# Patient Record
Sex: Male | Born: 1950 | Race: White | Hispanic: No | Marital: Married | Smoking: Former smoker
Health system: Southern US, Community
[De-identification: ages and names within clinical notes are randomized; demographics above are authoritative.]

## PROBLEM LIST (undated history)

## (undated) DIAGNOSIS — F329 Major depressive disorder, single episode, unspecified: Secondary | ICD-10-CM

## (undated) DIAGNOSIS — E0590 Thyrotoxicosis, unspecified without thyrotoxic crisis or storm: Secondary | ICD-10-CM

## (undated) DIAGNOSIS — I77810 Thoracic aortic ectasia: Secondary | ICD-10-CM

## (undated) DIAGNOSIS — I214 Non-ST elevation (NSTEMI) myocardial infarction: Secondary | ICD-10-CM

## (undated) DIAGNOSIS — Z973 Presence of spectacles and contact lenses: Secondary | ICD-10-CM

## (undated) DIAGNOSIS — K219 Gastro-esophageal reflux disease without esophagitis: Secondary | ICD-10-CM

## (undated) DIAGNOSIS — I739 Peripheral vascular disease, unspecified: Secondary | ICD-10-CM

## (undated) DIAGNOSIS — R0602 Shortness of breath: Secondary | ICD-10-CM

## (undated) DIAGNOSIS — E7800 Pure hypercholesterolemia, unspecified: Secondary | ICD-10-CM

## (undated) DIAGNOSIS — J9611 Chronic respiratory failure with hypoxia: Secondary | ICD-10-CM

## (undated) DIAGNOSIS — N179 Acute kidney failure, unspecified: Secondary | ICD-10-CM

## (undated) DIAGNOSIS — I4891 Unspecified atrial fibrillation: Secondary | ICD-10-CM

## (undated) DIAGNOSIS — E039 Hypothyroidism, unspecified: Secondary | ICD-10-CM

## (undated) DIAGNOSIS — F32A Depression, unspecified: Secondary | ICD-10-CM

## (undated) DIAGNOSIS — E079 Disorder of thyroid, unspecified: Secondary | ICD-10-CM

## (undated) DIAGNOSIS — I10 Essential (primary) hypertension: Secondary | ICD-10-CM

## (undated) DIAGNOSIS — J849 Interstitial pulmonary disease, unspecified: Secondary | ICD-10-CM

## (undated) DIAGNOSIS — M1990 Unspecified osteoarthritis, unspecified site: Secondary | ICD-10-CM

## (undated) DIAGNOSIS — I2510 Atherosclerotic heart disease of native coronary artery without angina pectoris: Secondary | ICD-10-CM

## (undated) DIAGNOSIS — E119 Type 2 diabetes mellitus without complications: Secondary | ICD-10-CM

## (undated) DIAGNOSIS — I5189 Other ill-defined heart diseases: Secondary | ICD-10-CM

## (undated) DIAGNOSIS — N183 Chronic kidney disease, stage 3 unspecified: Secondary | ICD-10-CM

## (undated) DIAGNOSIS — D126 Benign neoplasm of colon, unspecified: Secondary | ICD-10-CM

## (undated) DIAGNOSIS — K5790 Diverticulosis of intestine, part unspecified, without perforation or abscess without bleeding: Secondary | ICD-10-CM

## (undated) DIAGNOSIS — Z8709 Personal history of other diseases of the respiratory system: Secondary | ICD-10-CM

## (undated) DIAGNOSIS — F419 Anxiety disorder, unspecified: Secondary | ICD-10-CM

## (undated) DIAGNOSIS — I272 Pulmonary hypertension, unspecified: Secondary | ICD-10-CM

## (undated) HISTORY — DX: Shortness of breath: R06.02

## (undated) HISTORY — PX: HERNIA REPAIR: SHX51

## (undated) HISTORY — DX: Atherosclerotic heart disease of native coronary artery without angina pectoris: I25.10

## (undated) HISTORY — DX: Gastro-esophageal reflux disease without esophagitis: K21.9

## (undated) HISTORY — DX: Depression, unspecified: F32.A

## (undated) HISTORY — DX: Major depressive disorder, single episode, unspecified: F32.9

## (undated) HISTORY — DX: Essential (primary) hypertension: I10

## (undated) HISTORY — DX: Thoracic aortic ectasia: I77.810

## (undated) HISTORY — DX: Benign neoplasm of colon, unspecified: D12.6

## (undated) HISTORY — PX: FINGER GANGLION CYST EXCISION: SHX1636

## (undated) HISTORY — PX: COLONOSCOPY: SHX5424

## (undated) HISTORY — DX: Diverticulosis of intestine, part unspecified, without perforation or abscess without bleeding: K57.90

## (undated) HISTORY — DX: Chronic kidney disease, stage 3 unspecified: N18.30

## (undated) HISTORY — DX: Disorder of thyroid, unspecified: E07.9

## (undated) HISTORY — DX: Other ill-defined heart diseases: I51.89

## (undated) HISTORY — DX: Peripheral vascular disease, unspecified: I73.9

---

## 1980-03-23 HISTORY — PX: HEMORRHOID SURGERY: SHX153

## 1995-04-24 ENCOUNTER — Encounter: Payer: Self-pay | Admitting: Family Medicine

## 2004-02-01 ENCOUNTER — Ambulatory Visit: Payer: Self-pay | Admitting: Family Medicine

## 2004-03-23 ENCOUNTER — Encounter: Payer: Self-pay | Admitting: Family Medicine

## 2004-03-23 LAB — CONVERTED CEMR LAB: PSA: 0.3 ng/mL

## 2004-04-22 ENCOUNTER — Ambulatory Visit: Payer: Self-pay | Admitting: Family Medicine

## 2004-04-24 ENCOUNTER — Ambulatory Visit: Payer: Self-pay | Admitting: Family Medicine

## 2004-08-12 ENCOUNTER — Emergency Department (HOSPITAL_COMMUNITY): Admission: EM | Admit: 2004-08-12 | Discharge: 2004-08-12 | Payer: Self-pay | Admitting: Family Medicine

## 2004-10-22 ENCOUNTER — Ambulatory Visit: Payer: Self-pay | Admitting: Family Medicine

## 2004-12-25 ENCOUNTER — Ambulatory Visit: Payer: Self-pay | Admitting: Family Medicine

## 2005-02-11 ENCOUNTER — Ambulatory Visit: Payer: Self-pay | Admitting: Family Medicine

## 2005-02-16 ENCOUNTER — Ambulatory Visit: Payer: Self-pay | Admitting: Family Medicine

## 2005-04-23 ENCOUNTER — Encounter: Payer: Self-pay | Admitting: Family Medicine

## 2005-05-25 ENCOUNTER — Ambulatory Visit: Payer: Self-pay | Admitting: Family Medicine

## 2005-05-25 LAB — CONVERTED CEMR LAB: Microalbumin U total vol: 1.4 mg/L

## 2005-05-28 ENCOUNTER — Ambulatory Visit: Payer: Self-pay | Admitting: Family Medicine

## 2005-11-27 ENCOUNTER — Ambulatory Visit: Payer: Self-pay | Admitting: Family Medicine

## 2005-11-27 LAB — CONVERTED CEMR LAB: PSA: 0.6 ng/mL

## 2005-12-04 ENCOUNTER — Ambulatory Visit: Payer: Self-pay | Admitting: Family Medicine

## 2006-03-08 ENCOUNTER — Ambulatory Visit: Payer: Self-pay | Admitting: Family Medicine

## 2006-11-04 ENCOUNTER — Ambulatory Visit: Payer: Self-pay | Admitting: Gastroenterology

## 2006-11-18 ENCOUNTER — Ambulatory Visit: Payer: Self-pay | Admitting: Gastroenterology

## 2006-11-24 ENCOUNTER — Encounter: Payer: Self-pay | Admitting: Family Medicine

## 2007-01-13 ENCOUNTER — Telehealth: Payer: Self-pay | Admitting: Family Medicine

## 2007-03-02 ENCOUNTER — Encounter: Payer: Self-pay | Admitting: Family Medicine

## 2007-03-02 DIAGNOSIS — F3289 Other specified depressive episodes: Secondary | ICD-10-CM | POA: Insufficient documentation

## 2007-03-02 DIAGNOSIS — F17200 Nicotine dependence, unspecified, uncomplicated: Secondary | ICD-10-CM | POA: Insufficient documentation

## 2007-03-02 DIAGNOSIS — F329 Major depressive disorder, single episode, unspecified: Secondary | ICD-10-CM

## 2007-03-02 DIAGNOSIS — E1165 Type 2 diabetes mellitus with hyperglycemia: Secondary | ICD-10-CM

## 2007-03-02 DIAGNOSIS — E039 Hypothyroidism, unspecified: Secondary | ICD-10-CM

## 2007-03-02 DIAGNOSIS — E0590 Thyrotoxicosis, unspecified without thyrotoxic crisis or storm: Secondary | ICD-10-CM

## 2007-03-03 ENCOUNTER — Telehealth: Payer: Self-pay | Admitting: Family Medicine

## 2007-03-07 ENCOUNTER — Ambulatory Visit: Payer: Self-pay | Admitting: Family Medicine

## 2007-03-07 LAB — CONVERTED CEMR LAB
ALT: 19 units/L (ref 0–53)
Albumin: 4.1 g/dL (ref 3.5–5.2)
Alkaline Phosphatase: 63 units/L (ref 39–117)
BUN: 8 mg/dL (ref 6–23)
Calcium: 9.5 mg/dL (ref 8.4–10.5)
Chloride: 100 meq/L (ref 96–112)
GFR calc Af Amer: 90 mL/min
GFR calc non Af Amer: 74 mL/min
LDL Cholesterol: 99 mg/dL (ref 0–99)
PSA: 0.4 ng/mL (ref 0.10–4.00)
TSH: 1.49 microintl units/mL (ref 0.35–5.50)
Total CHOL/HDL Ratio: 4.6

## 2007-03-09 ENCOUNTER — Ambulatory Visit: Payer: Self-pay | Admitting: Family Medicine

## 2007-03-10 LAB — CONVERTED CEMR LAB
Creatinine,U: 42 mg/dL
Microalb, Ur: 0.2 mg/dL (ref 0.0–1.9)

## 2007-03-29 ENCOUNTER — Telehealth: Payer: Self-pay | Admitting: Family Medicine

## 2007-04-19 ENCOUNTER — Ambulatory Visit: Payer: Self-pay | Admitting: Family Medicine

## 2007-10-03 ENCOUNTER — Telehealth: Payer: Self-pay | Admitting: Family Medicine

## 2008-02-08 ENCOUNTER — Ambulatory Visit: Payer: Self-pay | Admitting: Family Medicine

## 2008-05-08 ENCOUNTER — Telehealth: Payer: Self-pay | Admitting: Family Medicine

## 2008-06-20 ENCOUNTER — Ambulatory Visit: Payer: Self-pay | Admitting: Family Medicine

## 2008-06-20 LAB — CONVERTED CEMR LAB
ALT: 33 units/L (ref 0–53)
AST: 33 units/L (ref 0–37)
Albumin: 3.8 g/dL (ref 3.5–5.2)
Basophils Absolute: 0 10*3/uL (ref 0.0–0.1)
Chloride: 108 meq/L (ref 96–112)
Eosinophils Relative: 2.5 % (ref 0.0–5.0)
GFR calc non Af Amer: 73.55 mL/min (ref >60)
Glucose, Bld: 138 mg/dL — ABNORMAL HIGH (ref 70–99)
HCT: 41.7 % (ref 39.0–52.0)
Hemoglobin: 14.9 g/dL (ref 13.0–17.0)
Lymphs Abs: 1.5 10*3/uL (ref 0.7–4.0)
Monocytes Relative: 6.5 % (ref 3.0–12.0)
Neutro Abs: 3.5 10*3/uL (ref 1.4–7.7)
Potassium: 4.5 meq/L (ref 3.5–5.1)
RDW: 11.6 % (ref 11.5–14.6)
Sodium: 142 meq/L (ref 135–145)
TSH: 1.22 microintl units/mL (ref 0.35–5.50)
VLDL: 29.6 mg/dL (ref 0.0–40.0)

## 2008-06-25 ENCOUNTER — Ambulatory Visit: Payer: Self-pay | Admitting: Family Medicine

## 2008-12-19 ENCOUNTER — Ambulatory Visit: Payer: Self-pay | Admitting: Family Medicine

## 2008-12-19 LAB — CONVERTED CEMR LAB
CO2: 30 meq/L (ref 19–32)
Chloride: 104 meq/L (ref 96–112)
Glucose, Bld: 121 mg/dL — ABNORMAL HIGH (ref 70–99)
Potassium: 4.7 meq/L (ref 3.5–5.1)
Sodium: 139 meq/L (ref 135–145)

## 2008-12-26 ENCOUNTER — Ambulatory Visit: Payer: Self-pay | Admitting: Family Medicine

## 2008-12-26 DIAGNOSIS — F411 Generalized anxiety disorder: Secondary | ICD-10-CM | POA: Insufficient documentation

## 2009-02-06 ENCOUNTER — Ambulatory Visit: Payer: Self-pay | Admitting: Family Medicine

## 2009-04-19 ENCOUNTER — Telehealth: Payer: Self-pay | Admitting: Family Medicine

## 2009-04-23 ENCOUNTER — Telehealth (INDEPENDENT_AMBULATORY_CARE_PROVIDER_SITE_OTHER): Payer: Self-pay

## 2009-04-24 ENCOUNTER — Ambulatory Visit: Payer: Self-pay | Admitting: Family Medicine

## 2009-06-04 ENCOUNTER — Ambulatory Visit: Payer: Self-pay | Admitting: Family Medicine

## 2009-06-04 DIAGNOSIS — J018 Other acute sinusitis: Secondary | ICD-10-CM | POA: Insufficient documentation

## 2009-08-06 ENCOUNTER — Telehealth: Payer: Self-pay | Admitting: Family Medicine

## 2009-10-29 ENCOUNTER — Encounter (INDEPENDENT_AMBULATORY_CARE_PROVIDER_SITE_OTHER): Payer: Self-pay | Admitting: *Deleted

## 2010-02-18 ENCOUNTER — Ambulatory Visit: Payer: Self-pay | Admitting: Family Medicine

## 2010-02-18 ENCOUNTER — Telehealth: Payer: Self-pay | Admitting: Family Medicine

## 2010-02-18 DIAGNOSIS — J029 Acute pharyngitis, unspecified: Secondary | ICD-10-CM

## 2010-02-18 DIAGNOSIS — L03019 Cellulitis of unspecified finger: Secondary | ICD-10-CM

## 2010-02-24 ENCOUNTER — Ambulatory Visit: Payer: Self-pay | Admitting: Family Medicine

## 2010-02-27 ENCOUNTER — Telehealth: Payer: Self-pay | Admitting: Family Medicine

## 2010-04-22 NOTE — Assessment & Plan Note (Signed)
Summary: THROAT INFECTION - FEELS LIKE ON FIRE / LFW   Vital Signs:  Patient profile:   60 year old male Height:      66.50 inches Weight:      179 pounds BMI:     28.56 Temp:     98.6 degrees F oral Pulse rate:   76 / minute Pulse rhythm:   regular BP sitting:   130 / 80  (left arm) Cuff size:   regular  Vitals Entered By: Sherrian Divers CMA Deborra Medina) (February 18, 2010 8:21 AM) CC: sore throat   History of Present Illness: 60 yo here for sore throat but has multiple issues he wants to discuss.  Just lost his job and wants to work everything in as quick as possible before he looses his insurance.  1.  URI symptoms- for past three weeks, URI symptoms are lingering.  Started with runny nose, body aches, subjective fever.  No cough is productive, has sinus pressure and ear pain. No wheezing or SOB.  2.  Has a cyst on his middle finger on left hand.  Has been there since he slammed a drill on his finger.  Sometimes he pokes it and it drains fluid but it fills back up.    3. Anixety- obviously more anxious than usual due to his recent job loss.  He is going to loose his home and everything he has worked so hard for.  Has panic attacks.  No SI or HI.  Alprazolam helps.  Current Medications (verified): 1)  Simvastatin 40 Mg  Tabs (Simvastatin) .... Take One By Mouth At Bedtime 2)  Levothroid 100 Mcg  Tabs (Levothyroxine Sodium) .... One Tabb By Mouth Once A Day. 3)  Prilosec Otc 20 Mg  Tbec (Omeprazole Magnesium) .Marland Kitchen.. 1 Daily 4)  Alprazolam 0.25 Mg Tabs (Alprazolam) .... One Tab By Mouth Two Times A Day As Needed For Anxiety 5)  Temovate 0.05 % Oint (Clobetasol Propionate) .... Apply To Area Two Times A Day As Needed 6)  Tussionex Pennkinetic Er 8-10 Mg/54m Lqcr (Chlorpheniramine-Hydrocodone) ..Marland Kitchen. 1 Tsp At Bedtime As Needed Cough. 7)  Chantix Continuing Month Pak 1 Mg Tabs (Varenicline Tartrate) .... As Dir 8)  Chantix Starting Month Pak 0.5 Mg X 11 & 1 Mg X 42 Tabs (Varenicline Tartrate)  .... As Dir 9)  Avelox 400 Mg  Tabs (Moxifloxacin Hcl) ..Marland Kitchen. 1 Tablet By Mouth Daily X 10 Days  Allergies: 1)  ! Penicillin V Potassium (Penicillin V Potassium) 2)  ! Budeprion Sr (Bupropion Hcl)  Past History:  Past Medical History: Last updated: 03/02/2007 Depression GERD Diabetes mellitus, type II Hyperthyroidism Hypothyroidism  Past Surgical History: Last updated: 03/09/2007 Hemmorhoidectomy 1982 I 131 Thyroid Trmt 1990s EGD Colonoscopy  [place] 1992 ETT, wnl, echo, mild MR 02/97 EGD/colonoscopy 1992        CT Head wnl 09/97 Colonoscopy ([facility] colitis 11/18/01 Colonoscopy polyps ([facility]     11/18/06    recheck 2013     Family History: Last updated: 06/25/2008 Father: A 83  hypothyroidism, reflux disease, elevated cholesterol, dx with prostate cancer treated with radiation, renal disease Mother: Died at age 963with colon cancer after a partial colectomy, ? HTN, DM Brother A 555 Sister A [id number] Laparotomy. 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 CV:  Denies chest pain or discomfort. Resp:  Complains of cough and sputum productive; denies shortness of breath and wheezing.  Physical Exam  General:  Well-developed,well-nourished,in no acute distress; alert,appropriate and cooperative throughout  examination, congested Ears:  TMs retracted bilaterally Nose:  External nasal examination shows no deformity or inflammation. Nasal mucosa are pink and moist without lesions, congested  Mouth:  pharyngeal erythema, no exudate.   Neck:  No deformities, masses, or tenderness noted. Lungs:  CTA bilaterally Heart:  Normal rate and regular rhythm. S1 and S2 normal without gallop, murmur, click, rub or other extra sounds. Extremities:  no edema in LE. 2 cm palpable paronychia on left 3rd digit. Cervical Nodes:  No lymphadenopathy noted Psych:  Cognition and judgment appear intact. Alert and cooperative with normal attention span and concentration. No apparent delusions, illusions, hallucinations   Impression & Recommendations:  Problem # 1:  OTHER ACUTE SINUSITIS (ICD-461.8) Assessment New Given duration and progression of symptoms, will treat for bacterial sinusitis. His updated medication list for this problem includes:    Tussionex Pennkinetic Er 8-10 Mg/48m Lqcr (Chlorpheniramine-hydrocodone) ..Marland Kitchen.. 1 tsp at bedtime as needed cough.    Avelox 400 Mg Tabs (Moxifloxacin hcl) ..Marland Kitchen.. 1 tablet by mouth daily x 10 days  Problem # 2:  ANXIETY (ICD-300.00) Assessment: Deteriorated Refilled his Alprazolom.  Not intereted in other medications or psychotherapy at this time. His updated medication list for this problem includes:    Alprazolam 0.25 Mg Tabs (Alprazolam) ..... One tab by mouth two times a day as needed for anxiety  Problem # 3:  PARONYCHIA, FINGER (ICD-681.02) Assessment: New Advised to schedule appt to drain next week. His updated medication list for this problem includes:    Avelox 400 Mg Tabs (Moxifloxacin hcl) ..Marland Kitchen.. 1 tablet by mouth daily x 10 days  Complete Medication List: 1)  Simvastatin 40 Mg Tabs (Simvastatin) .... Take one by mouth at bedtime 2)  Levothroid 100 Mcg Tabs (Levothyroxine sodium) .... One tabb by mouth once a day. 3)  Prilosec Otc 20 Mg Tbec (Omeprazole  magnesium) ..Marland Kitchen. 1 daily 4)  Alprazolam 0.25 Mg Tabs (Alprazolam) .... One tab by mouth two times a day as needed for anxiety 5)  Temovate 0.05 % Oint (Clobetasol propionate) .... Apply to area two times a day as needed 6)  Tussionex Pennkinetic Er 8-10 Mg/541mLqcr (Chlorpheniramine-hydrocodone) ...Marland Kitchen 1 tsp at bedtime as needed cough. 7)  Chantix Continuing Month Pak 1 Mg Tabs (Varenicline tartrate) .... As dir 8)  Chantix Starting Month Pak 0.5 Mg X 11 & 1 Mg X 42 Tabs (Varenicline tartrate) .... As dir 9)  Avelox 400 Mg Tabs (Moxifloxacin hcl) ...Marland Kitchen 1 tablet by mouth daily x 10 days  Other Orders: Rapid Strep ([phone] Patient Instructions: 1)  Please make a 30 minute appointment on your way out to drain your finger. Prescriptions: AVELOX 400 MG  TABS (MOXIFLOXACIN HCL) 1 tablet by mouth daily x 10 days  #10 x 0   Entered  and Authorized by:   Arnette Norris MD   Signed by:   Arnette Norris MD on 02/18/2010   Method used:   Electronically to        [id number] (retail)       [address], [place]  [address]       Ph: [phone] or [phone]       Fax: [phone]   RxID:   [id number] ALPRAZOLAM 0.25 MG TABS (ALPRAZOLAM) one tab by mouth two times a day as needed for anxiety  #90 x 0   Entered and Authorized by:   Arnette Norris MD   Signed by:   Arnette Norris MD on 02/18/2010   Method used:   Print then Give to Patient   RxID:   [id number] PRILOSEC OTC 20 MG  TBEC (OMEPRAZOLE MAGNESIUM) 1 DAILY  #90 x 3   Entered and Authorized by:   Arnette Norris MD   Signed by:   Arnette Norris MD on 02/18/2010   Method used:   Electronically to        [id number] (retail)       [address], [place]  [address]       Ph: [phone] or [phone]       Fax: [phone]   RxID:   [id number] LEVOTHROID 100 MCG  TABS (LEVOTHYROXINE SODIUM) one tabb by mouth once a day.  #90 x 3   Entered and Authorized by:    Arnette Norris MD   Signed by:   Arnette Norris MD on 02/18/2010   Method used:   Electronically to        [id number] (retail)       [address], [place]  [address]       Ph: [phone] or [phone]       Fax: [phone]   RxID:   [id number] SIMVASTATIN 40 MG  TABS (SIMVASTATIN) Take one by mouth at bedtime  #90 x 3   Entered and Authorized by:   Arnette Norris MD   Signed by:   Arnette Norris MD on 02/18/2010   Method used:   [facility]ronically to        [id number] ([facility]ail)       [address], [place]  [address]       Ph: [phone] or [phone]       Fax: [phone]   RxID:   [id number]    Orders Added: 1)  Rapid Strep [55974] 2)  Est. Patient Level IV [16384]    Current Allergies (reviewed today): ! PENICILLIN V POTASSIUM (PENICILLIN V POTASSIUM) ! BUDEPRION SR (BUPROPION HCL)  Laboratory Results    Other Tests  Rapid Strep: negative  Kit Test Internal QC: Positive   (Normal Range: Negative)

## 2010-04-22 NOTE — Progress Notes (Signed)
Summary: wants to try chantix  Phone Note Call from Patient Call back at Home Phone [phone]   Caller: Patient Call For: Raenette Rover MD Summary of Call: Pt states he was told to call when ready to quit smoking and he is ready.  [facility] [place]  States he has discussed this medicine with you before. Initial call taken by: Marty Heck CMA,  Aug 06, 2009 11:13 AM  Follow-up for Phone Call        Have pt pikup scripts and pack and get enrolled before starting med. Will help and help with cost. Follow-up by: Raenette Rover MD,  Aug 06, 2009 1:34 PM  Additional Follow-up for Phone Call Additional follow up Details #1::        [facility] for pt to call.             Marty Heck CMA  Aug 06, 2009 2:31 PM  Advised pt, he will come by to pick up packet. Additional Follow-up by: Marty Heck CMA,  Aug 06, 2009 3:46 PM    New/Updated Medications: CHANTIX CONTINUING MONTH PAK 1 MG TABS (VARENICLINE TARTRATE) as dir CHANTIX STARTING MONTH PAK 0.5 MG X 11 & 1 MG X 42 TABS (VARENICLINE TARTRATE) as dir Prescriptions: CHANTIX STARTING MONTH PAK 0.5 MG X 11 & 1 MG X 42 TABS (VARENICLINE TARTRATE) as dir  #1 pak x 0   Entered and Authorized by:   Raenette Rover MD   Signed by:   Raenette Rover MD on 08/06/2009   Method used:   Print then Give to Patient   RxID:   [id number] [place] PAK 1 MG TABS (VARENICLINE TARTRATE) as dir  #1 pak x 4   Entered and Authorized by:   Raenette Rover MD   Signed by:   Raenette Rover MD on 08/06/2009   Method used:   Print then Give to Patient   RxID:   [id number]

## 2010-04-22 NOTE — Assessment & Plan Note (Signed)
Summary: 30 MIN APPT TO DRAIN FINGER/[facility]   Vital Signs:  Patient profile:   60 year old male Height:      66.50 inches Weight:      177.75 pounds BMI:     28.36 Temp:     98.0 degrees F oral Pulse rate:   80 / minute Pulse rhythm:   regular BP sitting:   138 / 80  (left arm) Cuff size:   regular  Vitals Entered By: Sherrian Divers CMA Deborra Medina) (February 24, 2010 7:58 AM) CC: drain finger   History of Present Illness: 60 yo here to drain in paronychia on his finger.   Just lost his job and wants to work everything in as quick as possible before he looses his insurance.   Paronychia- has been on his 2nd digit of  left hand, comes and goes for a year.Marland Kitchen  Has been there since he slammed a drill on his finger.  Sometimes he pokes it and it drains fluid but it fills back up.  No fevers or chills.  Has been on Avelox for sinusitis, has 3 days left.      Current Medications (verified): 1)  Simvastatin 40 Mg  Tabs (Simvastatin) .... Take One By Mouth At Bedtime 2)  Levothroid 100 Mcg  Tabs (Levothyroxine Sodium) .... One Tabb By Mouth Once A Day. 3)  Prilosec Otc 20 Mg  Tbec (Omeprazole Magnesium) .Marland Kitchen.. 1 Daily 4)  Alprazolam 0.25 Mg Tabs (Alprazolam) .... One Tab By Mouth Two Times A Day As Needed For Anxiety 5)  Temovate 0.05 % Oint (Clobetasol Propionate) .... Apply To Area Two Times A Day As Needed 6)  Tussionex Pennkinetic Er 8-10 Mg/40m Lqcr (Chlorpheniramine-Hydrocodone) ..Marland Kitchen. 1 Tsp At Bedtime As Needed Cough. 7)  Chantix Continuing Month Pak 1 Mg Tabs (Varenicline Tartrate) .... As Dir 8)  Chantix Starting Month Pak 0.5 Mg X 11 & 1 Mg X 42 Tabs (Varenicline Tartrate) .... As Dir 9)  Avelox 400 Mg  Tabs (Moxifloxacin Hcl) ..Marland Kitchen. 1 Tablet By Mouth Daily X 10 Days 10)  Tessalon Perles 100 Mg  Caps (Benzonatate) ..Marland Kitchen. 1 Tab By Mouth Three Times A Day As Needed For Cough. 11)  Tussionex Pennkinetic Er 10-8 Mg/570mLqcr (Hydrocod Polst-Chlorphen Polst) .... 90m81my Mouth At Bedtime As Needed For  Cough  Allergies: 1)  ! Penicillin V Potassium (Penicillin V Potassium) 2)  ! Budeprion Sr (Bupropion Hcl)  Past History:  Past Medical History: Last updated: 03/02/2007 Depression GERD Diabetes mellitus, type II Hyperthyroidism Hypothyroidism  Past Surgical History: Last updated: 03/09/2007 Hemmorhoidectomy 1982 I 131 Thyroid Trmt 1990s EGD Colonoscopy  [place] 1992 ETT, wnl, echo, mild MR 02/97 EGD/colonoscopy 1992        CT Head wnl 09/97 Colonoscopy ([facility] 11/18/01 Colonoscopy polyps ([facility]   11/18/06    recheck 2013     Family History: Last updated: 06/25/2008 Father: A 83  hypothyroidism, reflux disease, elevated cholesterol, dx with prostate cancer treated with radiation, renal disease Mother: Died at age 32 32th colon cancer after a partial colectomy, ? HTN, DM Brother A 59 54ister A 58 60O Laparotomy. 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 GI:  Denies nausea and vomiting.  Physical Exam  General:  Well-developed,well-nourished,in no acute distress; alert,appropriate and cooperative throughout examination, less congested Extremities:  no edema in LE. 2 cm palpable paronychia on left end of 2nd digit, feels very firm, chronic nail changes. Psych:  Cognition and judgment appear intact. Alert and  cooperative with normal attention span and concentration. No apparent delusions, illusions, hallucinations   Impression & Recommendations:  Problem # 1:  PARONYCHIA, FINGER (ICD-681.02) Assessment Deteriorated Attempted to drain but there is chronic changes, thick, gel like fluid expressed. Finish avelox, call in a few days if he sees any signs of infection.  Refer to surgery. His updated medication list for this problem includes:    Avelox 400 Mg Tabs (Moxifloxacin hcl) .Marland Kitchen... 1 tablet by mouth daily x 10 days  Orders: Surgical Referral (Surgery) I&D Abscess, Simple / Single (10060)  Complete Medication List: 1)  Simvastatin 40 Mg Tabs (Simvastatin) .... Take one by mouth at bedtime 2)  Levothroid 100 Mcg Tabs (Levothyroxine sodium) .... One tabb by mouth once a day. 3)  Prilosec Otc 20 Mg Tbec (Omeprazole magnesium) .Marland Kitchen.. 1 daily 4)  Alprazolam 0.25 Mg Tabs (Alprazolam) .... One tab by mouth two times a day as needed for anxiety 5)  Temovate 0.05 % Oint (Clobetasol propionate) .... Apply to area two times a day as needed 6)  Tussionex Pennkinetic Er 8-10 Mg/7m Lqcr (Chlorpheniramine-hydrocodone) ..Marland Kitchen. 1 tsp at bedtime as needed cough. 7)  Chantix Continuing Month Pak 1 Mg Tabs (Varenicline tartrate) .... As dir 8)  Chantix Starting Month Pak 0.5 Mg X 11 & 1 Mg X 42 Tabs (Varenicline tartrate) .... As dir 9)  Avelox 400 Mg Tabs (Moxifloxacin hcl) ..Marland Kitchen. 1 tablet by mouth daily x 10 days 10)  Tessalon Perles 100 Mg Caps (Benzonatate) ..Marland Kitchen. 1 tab by mouth three times a day as needed for cough. 11)  Tussionex Pennkinetic Er 10-8 Mg/517mLqcr (Hydrocod polst-chlorphen polst) .... 32m54my mouth at bedtime as needed for cough  Patient Instructions: 1)  What you have is a chronic paronychia. 2)  Please soak your finger in warm water for 10 to 15 minutes at a time four times daily for at least 2 days.   3)  Take finish your antibiotic as directed. 4)  If symptoms do not improve by mid week, please  call me.  If symptoms worsen of you develop fevers or chills, please call us [place] soon as possible.   5)  Please stop by to see MarRosaria Ferries your way out.   Orders Added: 1)  Surgical Referral [Surgery] 2)  I&D Abscess, Simple / Single [10060]     Procedure Note Last Tetanus: Td (08/22/1998)  Incision & Drainage: The patient complains of pain, redness, and irritation. Date of onset: 02/27/2009 Indication: changing lesion  Procedure # 1: I & D    Size (in cm): 2.0    Location: 2nd digit.    Comment: several ccs of thick, clear fluid drained.    Instrument used: #15 blade    Anesthesia: 1% lidocaine w/o epinephrine  Cleaned and prepped with: betadine Additional Instructions: See pt instructions.    Current Allergies (reviewed today): !  PENICILLIN V POTASSIUM (PENICILLIN V POTASSIUM) ! BUDEPRION SR (BUPROPION HCL)

## 2010-04-22 NOTE — Progress Notes (Signed)
Summary: cough and congestion  Phone Note Call from Patient Call back at [phone]   Caller: Patient Call For: Raenette Rover MD Summary of Call: Pt request antibiotic still having congestion and cough. Cough med was called in by Dr Diona Browner on 04/19/09. Pt did not go to [facility]. Pt wants to see Dr. Council Mechanic on 04/24/09. Appt scheduled with Dr. Council Mechanic 04/24/09 at 2:45pm. Initial call taken by: Ozzie Hoyle LPN,  April 23, 9430 1:03 PM

## 2010-04-22 NOTE — Progress Notes (Signed)
Summary: requests more antibiotic  Phone Note Refill Request Call back at Home Phone [phone] Message from:  Patient  Refills Requested: Medication #1:  AVELOX 400 MG  TABS 1 tablet by mouth daily x 10 days Pt took his last avalox this morning and he is asking if he can have a refill.  He says his throat is better but still sore, finger is better but still sore. He didnt go to hand specialist because he would have had to pay out of pocket.  [facility] [place]  Initial call taken by: Marty Heck CMA, AAMA,  February 27, 2010 8:59 AM  Follow-up for Phone Call        He does not need a refill of avelox.  If his finger is sore and red, we need to try doxycyline. Will send in to his pharmacy. Arnette Norris MD  February 27, 2010 9:00 AM  Patient advised as instructed via telephone.  He says that his finger is not his concern but his throat is.  His throat is still feeling scratcy and raw.  Please advise.  Sherrian Divers CMA Deborra Medina)  February 27, 2010 10:34 AM    Additional Follow-up for Phone Call Additional follow up Details #1::        Will take time for URI to resolve completely.  Try losenges.  Doxy will work on URI bugs as well. Arnette Norris MD  February 27, 2010 10:48 AM  Called patient on home number and cell phone, got no answer or voicemail.  Will call back later.  Sherrian Divers CMA Deborra Medina)  February 27, 2010 11:14 AM      Additional Follow-up for Phone Call Additional follow up Details #2::    Advised pt.               Marty Heck CMA, AAMA  February 27, 2010 11:49 AM   New/Updated Medications: DOXYCYCLINE HYCLATE 100 MG CAPS (DOXYCYCLINE HYCLATE) Take 1 tab twice a day x 10 days Prescriptions: DOXYCYCLINE HYCLATE 100 MG CAPS (DOXYCYCLINE HYCLATE) Take 1 tab twice a day x 10 days  #20 x 0   Entered and Authorized by:   Arnette Norris MD   Signed by:   Arnette Norris MD on 02/27/2010   Method used:   [facility]        [id number]       [address]  [place], [place]  [address]       Ph: [phone] or [phone]       Fax: [phone]   RxID:   [id number]

## 2010-04-22 NOTE — Assessment & Plan Note (Signed)
Summary: ST,COUGH,CONGESTION/CLE   Vital Signs:  Patient profile:   60 year old male Height:      66.50 inches Weight:      178.38 pounds BMI:     28.46 Temp:     97.5 degrees F oral Pulse rate:   88 / minute Pulse rhythm:   regular BP sitting:   124 / 80  (left arm) Cuff size:   regular  Vitals Entered By: Christena Deem CMA Deborra Medina) (June 04, 2009 11:44 AM) CC: ST, cough, congestion   History of Present Illness: 60 yo here for facial pressure, congestion, and dry cough. Started over a month ago, given a Zpack.  Felt he got a little better but over last two weeks, symptoms are worsening again. Feels feverish at time with chills. No SOB, no CP, no n/v/d. Tussionex helps with cough.  Current Medications (verified): 1)  Simvastatin 40 Mg  Tabs (Simvastatin) .... Take One By Mouth At Bedtime 2)  Levothroid 100 Mcg  Tabs (Levothyroxine Sodium) .... One Tabb By Mouth Once A Day. 3)  Prilosec Otc 20 Mg  Tbec (Omeprazole Magnesium) .Marland Kitchen.. 1 Daily 4)  Alprazolam 0.25 Mg Tabs (Alprazolam) .... One Tab By Mouth Two Times A Day As Needed For Anxiety 5)  Temovate 0.05 % Oint (Clobetasol Propionate) .... Apply To Area Two Times A Day As Needed 6)  Tussionex Pennkinetic Er 8-10 Mg/28m Lqcr (Chlorpheniramine-Hydrocodone) ..Marland Kitchen. 1 Tsp At Bedtime As Needed Cough. 7)  Avelox 400 Mg  Tabs (Moxifloxacin Hcl) ..Marland Kitchen. 1 Tablet By Mouth Daily X 10 Days  Allergies: 1)  ! Penicillin V Potassium (Penicillin V Potassium) 2)  ! Budeprion Sr (Bupropion Hcl)  Review of Systems      See HPI General:  Complains of chills and fever. ENT:  Complains of nasal congestion, postnasal drainage, sinus pressure, and sore throat; denies ear discharge and earache. CV:  Denies chest pain or discomfort. Resp:  Complains of cough; denies shortness of breath, sputum productive, and wheezing.  Physical Exam  General:  Well-developed,well-nourished,in no acute distress; alert,appropriate and cooperative throughout  examination, congested Ears:  TMs retracted bilaterally Nose:  External nasal examination shows no deformity or inflammation. Nasal mucosa are pink and moist without lesions, congested w/ mucous L narrowed. Mouth:  Oral mucosa and oropharynx without lesions or exudates.  Teeth in good repair. Lungs:  CTA bilaterally Heart:  Normal rate and regular rhythm. S1 and S2 normal without gallop, murmur, click, rub or other extra sounds. Extremities:  no edema Cervical Nodes:  No lymphadenopathy noted Psych:  Cognition and judgment appear intact. Alert and cooperative with normal attention span and concentration. No apparent delusions, illusions, hallucinations   Impression & Recommendations:  Problem # 1:  OTHER ACUTE SINUSITIS (ICD-461.8) Assessment New Likely resistant. Will try Avelox 400 mg daily x 10 days. Tussionex as needed cough. The following medications were removed from the medication list:    Guaifenesin-codeine 100-10 Mg/518mSyrp (Guaifenesin-codeine) ...Marland Kitchen. 1-2 tsp by mouth at bedtime at bedtime    Zithromax Z-pak 250 Mg Tabs (Azithromycin) ...Marland Kitchen. As dir    Tessalon 200 Mg Caps (Benzonatate) ..... One tab by mouth three times a day    Tussionex Pennkinetic Er 8-10 Mg/67m57mqcr (Chlorpheniramine-hydrocodone) ..... One tsp by mouth at night for cough His updated medication list for this problem includes:    Tussionex Pennkinetic Er 8-10 Mg/67ml67mcr (Chlorpheniramine-hydrocodone) .....Marland Kitchen1 tsp at bedtime as needed cough.    Avelox 400 Mg Tabs (Moxifloxacin hcl) .....Marland Kitchen1 tablet  by mouth daily x 10 days  Complete Medication List: 1)  Simvastatin 40 Mg Tabs (Simvastatin) .... Take one by mouth at bedtime 2)  Levothroid 100 Mcg Tabs (Levothyroxine sodium) .... One tabb by mouth once a day. 3)  Prilosec Otc 20 Mg Tbec (Omeprazole magnesium) .Marland Kitchen.. 1 daily 4)  Alprazolam 0.25 Mg Tabs (Alprazolam) .... One tab by mouth two times a day as needed for anxiety 5)  Temovate 0.05 % Oint (Clobetasol  propionate) .... Apply to area two times a day as needed 6)  Tussionex Pennkinetic Er 8-10 Mg/15m Lqcr (Chlorpheniramine-hydrocodone) ..Marland Kitchen. 1 tsp at bedtime as needed cough. 7)  Avelox 400 Mg Tabs (Moxifloxacin hcl) ..Marland Kitchen. 1 tablet by mouth daily x 10 days Prescriptions: AVELOX 400 MG  TABS (MOXIFLOXACIN HCL) 1 tablet by mouth daily x 10 days  #10 x 0   Entered and Authorized by:   TArnette NorrisMD   Signed by:   TArnette NorrisMD on 06/04/2009   Method used:   Print then Give to Patient   RxID:   [id number] 8-10 MG/5ML LQCR (CHLORPHENIRAMINE-HYDROCODONE) 1 tsp at bedtime as needed cough.  #4 ounces x 0   Entered and Authorized by:   TArnette NorrisMD   Signed by:   TArnette NorrisMD on 06/04/2009   Method used:   Print then Give to Patient   RxID:   [id number]  Current Allergies (reviewed today): ! PENICILLIN V POTASSIUM (PENICILLIN V POTASSIUM) ! BUDEPRION SR (BUPROPION HCL)

## 2010-04-22 NOTE — Progress Notes (Signed)
Summary: requests cough medicine  Phone Note Call from Patient Call back at Home Phone [phone]   Caller: Patient Summary of Call: Pt was seen this morning and thought that a cough medicine was going to be prescribed.  He is asking that something with codeine be sent to [facility] Initial call taken by: Marty Heck CMA, AAMA,  February 18, 2010 11:59 AM  Follow-up for Phone Call        Patient is requesting Tussinex which he has had in the past.  Dr. Deborra Medina is out of the office please advise.  Sherrian Divers CMA Deborra Medina)  February 18, 2010 1:21 PM   Additional Follow-up for Phone Call Additional follow up Details #1::        Please call in tussionex 107m by mouth at bedtime as needed for cough.  #4oz, no rf.  He had been on this before.  sedation caution.   thanks.  Additional Follow-up by: GElsie StainMD,  February 18, 2010 1:31 PM    Additional Follow-up for Phone Call Additional follow up Details #2::    Rx called to pharmacy and spouse notified via telephone. Follow-up by: NSherrian DiversCMA (Deborra Medina,  February 18, 2010 1:45 PM  New/Updated Medications: TESSALON PERLES 100 MG  CAPS (BENZONATATE) 1 tab by mouth three times a day as needed for cough. TUSSIONEX PENNKINETIC ER 10-8 MG/5ML LQCR (HYDROCOD POLST-CHLORPHEN POLST) 523mby mouth at bedtime as needed for cough Prescriptions: TUSSIONEX PENNKINETIC ER 10-8 MG/5ML LQCR (HYDROCOD POLST-CHLORPHEN POLST) 34m54my mouth at bedtime as needed for cough  #4 ounces x 0   Entered by:   NikSherrian DiversA (AAMJerseyt[place] Authorized by:   GraElsie Stain   Signed by:   NikSherrian DiversA (AAMClear Laken 02/18/2010   Method used:   [facility] ...       [id number]       [address] [place]  [id number]    Ph: [phone]    Fax: [phone]:   [id number] PERLES 100 MG  CAPS (BENZONATATE) 1 tab by mouth three times a day as needed for cough.  #30 x  0   Entered and Authorized by:   TalArnette Norris   Signed by:   TalArnette Norris on 02/18/2010   Method used:   [facility]        [id number]       [address]  [id number]    Ph: [phone]    Fax: [phone]:  :   [id number] Allergies (reviewed today): ! PENICILLIN V POTASSIUM (PENICILLIN V POTASSIUM) ! BUDEPRION SR (BUPROPION HCL)

## 2010-04-22 NOTE — Assessment & Plan Note (Signed)
Summary: Brian Casey CONGESTED,ST/RI   Vital Signs:  Patient profile:   60 year old male Weight:      183 pounds BMI:     29.20 Temp:     98.2 degrees F oral Pulse rate:   60 / minute Pulse rhythm:   regular BP sitting:   120 / 64  (left arm) Cuff size:   regular  Vitals Entered By: Emelia Salisbury LPN (April 24, 2561 2:33 PM) CC: Productive cough/green, runny nose and head congestion   History of Present Illness: Pt here for congestion. he has chills occas but no apparent fever. He has been using "home remedies" like using Vicks on the chest. He denies headache, he is congested in the head, ant cerv chain nodes swollen yest. He had old Zpack that he thought would help but it "didn't cure me." No ear pain, rhinitis that is clear to yellow, some ST now, worse when it started, "things feel closed...going on 11/2 weeks, has cough productive occas green sometimes clear. Coughs significantly at night. He denies SOB, no N/V.  He has taken Tyl  and Zpak.    Allergies: 1)  ! Penicillin V Potassium (Penicillin V Potassium) 2)  ! Budeprion Sr (Bupropion Hcl)  Physical Exam  General:  Well-developed,well-nourished,in no acute distress; alert,appropriate and cooperative throughout examination, congested Head:  Normocephalic and atraumatic without obvious abnormalities. No apparent alopecia or balding. Sinuses NT Eyes:  Conjunctiva clear bilaterally Ears:  External ear exam shows white dry skin lesion on R pinna.  Otoscopic examination reveals mild inflammation in R ear canal, tympanic membranes are intact bilaterally without bulging, retraction, inflammation or discharge. Hearing is grossly normal  TMs dull to LR.bilaterally. Nose:  External nasal examination shows no deformity or inflammation. Nasal mucosa are pink and moist without lesions, congested w/ mucous L narrowed. Mouth:  Oral mucosa and oropharynx without lesions or exudates.  Teeth in good repair. Neck:  No deformities,  masses, or tenderness noted. Lungs:  Ronchi throughout with mild fine wheezing.   Impression & Recommendations:  Problem # 1:  BRONCHITIS- ACUTE (ICD-466.0) Assessment New  See instructions. His updated medication list for this problem includes:    Guaifenesin-codeine 100-10 Mg/535m Syrp (Guaifenesin-codeine) ..Marland Kitchen.. 1-2 tsp by mouth at bedtime at bedtime    Zithromax Z-pak 250 Mg Tabs (Azithromycin) ..Marland Kitchen.. As dir    Tessalon 200 Mg Caps (Benzonatate) ..... One tab by mouth three times a day    Tussionex Pennkinetic Er 8-10 Mg/550mLqcr (Chlorpheniramine-hydrocodone) ..... One tsp by mouth at night for cough  Take antibiotics and other medications as directed. Encouraged to push clear liquids, get enough rest, and take acetaminophen as needed. To be seen in 5-7 days if no improvement, sooner if worse.  Complete Medication List: 1)  Simvastatin 40 Mg Tabs (Simvastatin) .... Take one by mouth at bedtime 2)  Levothroid 100 Mcg Tabs (Levothyroxine sodium) .... One tabb by mouth once a day. 3)  Prilosec Otc 20 Mg Tbec (Omeprazole magnesium) ...Marland Kitchen 1 daily 4)  Alprazolam 0.25 Mg Tabs (Alprazolam) .... One tab by mouth two times a day as needed for anxiety 5)  Temovate 0.05 % Oint (Clobetasol propionate) .... Apply to area two times a day as needed 6)  Guaifenesin-codeine 100-10 Mg/35m69myrp (Guaifenesin-codeine) ....Marland Kitchen1-2 tsp by mouth at bedtime at bedtime 7)  Zithromax Z-pak 250 Mg Tabs (Azithromycin) .... As dir 8)  Tessalon 200 Mg Caps (Benzonatate) .... One tab by mouth three times a day 9)  Tussionex Pennkinetic Er 8-10 Mg/86m Lqcr (Chlorpheniramine-hydrocodone) .... One tsp by mouth at night for cough  Patient Instructions: 1)  Start Z pak again next Wed. 2)  Take Guaifenesin by [facility] and getting MUCOUS RELIEF EXPECTORANT (4046m, take 11/2 tabs by mouth AM and NOON. 3)  Drink lots of fluids anytime taking Guaifenesin.  4)  Tessalon three times a day    5)  Tussionex at night.  6)  Keep lozenge in mouth. Prescriptions: TUCathie HoopsR 8-10 MG/5ML LQCR (CHLORPHENIRAMINE-HYDROCODONE) one tsp by mouth at night for cough  #8 oz x 0   Entered and Authorized by:   RoRaenette RoverD   Signed by:   RoRaenette RoverD on 04/24/2009   Method used:   Print then Give to Patient   RxID:   [id number] 200 MG CAPS (BENZONATATE) one tab by mouth three times a day  #40 x 0   Entered and Authorized by:   RoRaenette RoverD   Signed by:   RoRaenette RoverD on 04/24/2009   Method used:   [facility]        [id number]       [address]  [id number]     Ph: [phone]     Fax: [phone] RxID:   [id number] Z-PAK 250 MG TABS (AZITHROMYCIN) as dir  #1 pak x 0   Entered and Authorized by:   RoRaenette RoverD   Signed by:   RoRaenette RoverD on 04/24/2009   Method used:   [facility]        [id number]       [address]  [id number]     Ph: [phone]     Fax: [phone] RxID:   [id number] Current Allergies (reviewed today): ! PENICILLIN V POTASSIUM (PENICILLIN V POTASSIUM) ! BUDEPRION SR (BUPROPION HCL)

## 2010-04-22 NOTE — Letter (Signed)
Summary: Anabel Halon letter  [place] at [facility]  [address] [place], [place] [address]   Phone: [phone]  Fax: [phone]       10/29/2009 MRN: [id number]  [facility] [address] [place], [place]  [address]  Dear Mr. Meriam Sprague [place], and [place] announce the retirement of Modesto Charon, M.D., from full-time practice at the [facility] office effective September 19, 2009 and his plans of returning part-time.  It is important to Dr. Council Mechanic and to our practice that you understand that [facility] has seven physicians in our office for your health care needs.  We will continue to offer the same exceptional care that you have today.    Dr. Council Mechanic has spoken to many of you about his plans for retirement and returning part-time in the fall.   We will continue to work with you through the transition to schedule appointments for you in the office and meet the high standards that [facility] is committed to.   Again, it is with great pleasure that we share the news that Dr. Council Mechanic will return to [facility] at [facility] in October of 2011 with a reduced schedule.    If you have any questions, or would like to request an appointment with one of our physicians, please call us at [phone] and press the option for Scheduling an appointment.  We take pleasure in providing you with excellent patient care and look forward to seeing you at your next office visit.  [facility] are:  Viviana Simpler, M.D. Teresa Pelton, M.D. Loura Pardon, M.D. Eliezer Lofts, M.D. Owens Loffler, M.D. Arnette Norris, M.D. We proudly welcomed Renford Dills, M.D. and Ria Bush, M.D. to the practice in July/August 2011.  Sincerely,  [facility]

## 2010-04-22 NOTE — Progress Notes (Signed)
Summary: bronchial infection  Phone Note Call from Patient Call back at [phone] cell   Caller: Patient Call For: Dr Diona Browner Summary of Call: Pt left v/m having bronchial infection and wanted call back. When I called back I got pt's v/m and pt to call me back. Initial call taken by: Ozzie Hoyle LPN,  April 20, 3435 2:59 PM  Follow-up for Phone Call        I called pt back and pt said he did not get my v/m. Pt said Dr. Council Mechanic used to call in zpack and cough med with codeine. I offered pt [facility] and he said would continue home remedy  until Mon.  If he was not better he would call b ack and speak with Dr. Council Mechanic. Pt has been sick 4 days withbronchial problem. I explained to pt if needed he could call office # and get setup with [facility]. Pt said OK he would see if he needed that.Ozzie Hoyle LPN  April 19, 3576 5:27 PM   Additional Follow-up for Phone Call Additional follow up Details #1::        Agreed..we don't call in antibitoics with exam as policy. Will call in cough suppressant. [facility] if not improving.  Additional Follow-up by: Eliezer Lofts MD,  April 19, 2009 5:32 PM    Additional Follow-up for Phone Call Additional follow up Details #2::    [facility] as [facility]. Patient notified as instructed by telephone. Will send to Dr. Council Mechanic per Dr. Rometta Emery Arn Medal LPN  April 19, 9782 5:37 PM  Agree with above. Will not call in Abs w/o being seen either. Follow-up by: Raenette Rover MD,  April 19, 2009 5:42 PM  New/Updated Medications: GUAIFENESIN-CODEINE 100-10 MG/5ML SYRP (GUAIFENESIN-CODEINE) 1-2 tsp by mouth at bedtime at bedtime Prescriptions: GUAIFENESIN-CODEINE 100-10 MG/5ML SYRP (GUAIFENESIN-CODEINE) 1-2 tsp by mouth at bedtime at bedtime  #8 oz x 0   Entered and Authorized by:   Eliezer Lofts MD   Signed by:   Eliezer Lofts MD on 04/19/2009   Method used:   [facility]phoned to ...       [id number]       [address] [place], [place]  [address]       Ph: [phone] or [phone]       Fax: [phone]   RxID:   [id number]

## 2010-07-28 ENCOUNTER — Ambulatory Visit (INDEPENDENT_AMBULATORY_CARE_PROVIDER_SITE_OTHER): Payer: BC Managed Care – PPO | Admitting: Family Medicine

## 2010-07-28 ENCOUNTER — Encounter: Payer: Self-pay | Admitting: Family Medicine

## 2010-07-28 VITALS — BP 136/86 | HR 62 | Temp 98.9°F | Ht 67.0 in | Wt 185.8 lb

## 2010-07-28 DIAGNOSIS — J018 Other acute sinusitis: Secondary | ICD-10-CM

## 2010-07-28 LAB — HM COLONOSCOPY

## 2010-07-28 LAB — HM DIABETES FOOT EXAM

## 2010-07-28 MED ORDER — CHLORPHENIRAMINE-HYDROCODONE 8-10 MG/5ML PO LQCR: 5.0000 mL | Freq: Two times a day (BID) | ORAL | Status: DC | PRN

## 2010-07-28 MED ORDER — MOXIFLOXACIN HCL 400 MG PO TABS: 400.0000 mg | ORAL_TABLET | Freq: Every day | ORAL | Status: AC

## 2010-07-28 NOTE — Progress Notes (Signed)
URI symptoms- for past three weeks, URI symptoms are lingering.  Started with runny nose, body aches, subjective fever.  Now cough is productive, has sinus pressure and ear pain. No wheezing or SOB.  The PMH, PSH, Social History, Family History, Medications, and allergies have been reviewed in [facility], and have been updated if relevant.  Review of Systems       See HPI General:  Complains of loss of appetite and malaise. CV:  Denies chest pain or discomfort. Resp:  Complains of cough and sputum productive; denies shortness of breath and wheezing.  Physical Exam BP 136/86  Pulse 62  Temp(Src) 98.9 F (37.2 C) (Oral)  Ht _0  (1.702 m)  Wt 185 lb 12.8 oz (84.278 kg)  BMI 29.10 kg/m2  General:  Well-developed,well-nourished,in no acute distress; alert,appropriate and cooperative throughout examination, congested Ears:  TMs retracted bilaterally Nose:  External nasal examination shows no deformity or inflammation. Nasal mucosa are pink and moist without lesions, congested  Mouth:  pharyngeal erythema, no exudate.   Neck:  No deformities, masses, or tenderness noted. Lungs:  CTA bilaterally Heart:  Normal rate and regular rhythm. S1 and S2 normal without gallop, murmur, click, rub or other extra sounds. Cervical Nodes:  No lymphadenopathy noted Psych:  Cognition and judgment appear intact. Alert and cooperative with normal attention span and concentration. No apparent delusions, illusions, hallucinations

## 2010-07-28 NOTE — Assessment & Plan Note (Signed)
New. PCN allergic and reports intolerance to Zpack. Given duration and progression of symptoms, will treat for bacterial sinusitis with Avelox. Supportive care as per pt instructions.

## 2010-07-28 NOTE — Patient Instructions (Signed)
Take antibiotic as directed.  Drink lots of fluids.  Treat sympotmatically with Mucinex, nasal saline irrigation, and Tylenol/Ibuprofen. Also try claritin D or zyrtec D over the counter- two times a day as needed ( have to sign for them at pharmacy). You can use warm compresses.  Cough suppressant at night. Call if not improving as expected in 5-7 days.

## 2011-01-16 ENCOUNTER — Other Ambulatory Visit: Payer: Self-pay | Admitting: Family Medicine

## 2011-01-16 DIAGNOSIS — E119 Type 2 diabetes mellitus without complications: Secondary | ICD-10-CM

## 2011-01-16 DIAGNOSIS — E039 Hypothyroidism, unspecified: Secondary | ICD-10-CM

## 2011-01-16 DIAGNOSIS — Z136 Encounter for screening for cardiovascular disorders: Secondary | ICD-10-CM

## 2011-01-21 ENCOUNTER — Other Ambulatory Visit (INDEPENDENT_AMBULATORY_CARE_PROVIDER_SITE_OTHER): Payer: BC Managed Care – PPO

## 2011-01-21 DIAGNOSIS — E119 Type 2 diabetes mellitus without complications: Secondary | ICD-10-CM

## 2011-01-21 DIAGNOSIS — Z136 Encounter for screening for cardiovascular disorders: Secondary | ICD-10-CM

## 2011-01-21 DIAGNOSIS — E039 Hypothyroidism, unspecified: Secondary | ICD-10-CM

## 2011-01-21 LAB — BASIC METABOLIC PANEL
Calcium: 8.9 mg/dL (ref 8.4–10.5)
Chloride: 105 mEq/L (ref 96–112)
Creatinine, Ser: 1 mg/dL (ref 0.4–1.5)

## 2011-01-21 LAB — LIPID PANEL
Cholesterol: 159 mg/dL (ref 0–200)
Triglycerides: 105 mg/dL (ref 0.0–149.0)

## 2011-01-26 ENCOUNTER — Ambulatory Visit (INDEPENDENT_AMBULATORY_CARE_PROVIDER_SITE_OTHER): Payer: BC Managed Care – PPO | Admitting: Family Medicine

## 2011-01-26 ENCOUNTER — Encounter: Payer: Self-pay | Admitting: Family Medicine

## 2011-01-26 VITALS — BP 140/80 | HR 71 | Temp 98.6°F | Ht 67.0 in | Wt 184.8 lb

## 2011-01-26 DIAGNOSIS — E039 Hypothyroidism, unspecified: Secondary | ICD-10-CM

## 2011-01-26 DIAGNOSIS — E119 Type 2 diabetes mellitus without complications: Secondary | ICD-10-CM

## 2011-01-26 DIAGNOSIS — E785 Hyperlipidemia, unspecified: Secondary | ICD-10-CM | POA: Insufficient documentation

## 2011-01-26 DIAGNOSIS — Z0000 Encounter for general adult medical examination without abnormal findings: Secondary | ICD-10-CM

## 2011-01-26 DIAGNOSIS — Z23 Encounter for immunization: Secondary | ICD-10-CM

## 2011-01-26 MED ORDER — SIMVASTATIN 40 MG PO TABS: 40.0000 mg | ORAL_TABLET | Freq: Every day | ORAL | Status: DC

## 2011-01-26 MED ORDER — CLOBETASOL PROPIONATE 0.05 % EX OINT: 1.0000 "application " | TOPICAL_OINTMENT | Freq: Two times a day (BID) | CUTANEOUS | Status: DC

## 2011-01-26 MED ORDER — LEVOTHYROXINE SODIUM 100 MCG PO TABS: 100.0000 ug | ORAL_TABLET | Freq: Every day | ORAL | Status: DC

## 2011-01-26 NOTE — Progress Notes (Signed)
  Subjective:    Patient ID: Brian Casey, male    DOB: 06/04/1950, 60 y.o.   MRN: [id number]  HPI  60 yo here for CPX.  Hypothyroidism- On synthroid 100 mcg. Denies any symptoms of hypo or hyperthyroidism. Lab Results  Component Value Date   TSH 0.44 01/21/2011    Lipid panel- On Zocor 40 mg daily.  HDL MUCH improved! Denies any myalgias. Lab Results  Component Value Date   CHOL 159 01/21/2011   HDL 51.90 01/21/2011   LDLCALC 86 01/21/2011   TRIG 105.0 01/21/2011   CHOLHDL 3 01/21/2011     Review of Systems  See HPI Patient reports no  vision/ hearing changes,anorexia, weight change, fever ,adenopathy, persistant / recurrent hoarseness, swallowing issues, chest pain, edema,persistant / recurrent cough, hemoptysis, dyspnea(rest, exertional, paroxysmal nocturnal), gastrointestinal  bleeding (melena, rectal bleeding), abdominal pain, excessive heart burn, GU symptoms(dysuria, hematuria, pyuria, voiding/incontinence  Issues) syncope, focal weakness, severe memory loss, concerning skin lesions, depression, anxiety, abnormal bruising/bleeding, major joint swelling.       Objective:   Physical Exam BP 140/80  Pulse 71  Temp(Src) 98.6 F (37 C) (Oral)  Ht _0  (1.702 m)  Wt 184 lb 12 oz (83.802 kg)  BMI 28.94 kg/m2 General:  overweght male in NAD Eyes:  PERRL Ears:  External ear exam shows no significant lesions or deformities.  Otoscopic examination reveals clear canals, tympanic membranes are intact bilaterally without bulging, retraction, inflammation or discharge. Hearing is grossly normal bilaterally. Nose:  External nasal examination shows no deformity or inflammation. Nasal mucosa are pink and moist without lesions or exudates. Mouth:  Oral mucosa and oropharynx without lesions or exudates.  Teeth in good repair. Neck:  no carotid bruit or thyromegaly no cervical or supraclavicular lymphadenopathy  Lungs:  Normal respiratory effort, chest expands symmetrically.  Lungs are clear to auscultation, no crackles or wheezes. Heart:  Normal rate and regular rhythm. S1 and S2 normal without gallop, murmur, click, rub or other extra sounds. Abdomen:  Bowel sounds positive,abdomen soft and non-tender without masses, organomegaly or hernias noted. Genitalia:  Testes bilaterally descended without nodularity, tenderness or masses. No scrotal masses or lesions. No penis lesions or urethral discharge. Pulses:  R and L posterior tibial pulses are full and equal bilaterally  Extremities:  no edema    Assessment & Plan:   1. Need for prophylactic vaccination and inoculation against influenza    2. Routine general medical examination at a health care facility  Reviewed preventive care protocols, scheduled due services, and updated immunizations Discussed nutrition, exercise, diet, and healthy lifestyle.  IFOB Flu shot  3. DIABETES MELLITUS, TYPE II  Diet controlled.  A1c excellent.   4. HYPOTHYROIDISM  Stable.  Continue current dose of Synthroid. levothyroxine (SYNTHROID, LEVOTHROID) 100 MCG tablet  5. HLD (hyperlipidemia)  Improved and at goal.  Continue current dose of Zocor. simvastatin (ZOCOR) 40 MG tablet

## 2011-01-26 NOTE — Patient Instructions (Signed)
Health Maintenance, Males A healthy lifestyle and preventative care can promote health and wellness.  Maintain regular health, dental, and eye exams.   Eat a healthy diet. Foods like vegetables, fruits, whole grains, low-fat dairy products, and lean protein foods contain the nutrients you need without too many calories. Decrease your intake of foods high in solid fats, added sugars, and salt. Get information about a proper diet from your caregiver, if necessary.   Regular physical exercise is one of the most important things you can do for your health. Most adults should get at least 150 minutes of moderate-intensity exercise (any activity that increases your heart rate and causes you to sweat) each week. In addition, most adults need muscle-strengthening exercises on 2 or more days a week.    Maintain a healthy weight. The body mass index (BMI) is a screening tool to identify possible weight problems. It provides an estimate of body fat based on height and weight. Your caregiver can help determine your BMI, and can help you achieve or maintain a healthy weight. For adults 20 years and older:   A BMI below 18.5 is considered underweight.   A BMI of 18.5 to 24.9 is normal.   A BMI of 25 to 29.9 is considered overweight.   A BMI of 30 and above is considered obese.   Maintain normal blood lipids and cholesterol by exercising and minimizing your intake of saturated fat. Eat a balanced diet with plenty of fruits and vegetables. Blood tests for lipids and cholesterol should begin at age 82 and be repeated every 5 years. If your lipid or cholesterol levels are high, you are over 50, or you are a high risk for heart disease, you may need your cholesterol levels checked more frequently.Ongoing high lipid and cholesterol levels should be treated with medicines, if diet and exercise are not effective.   If you smoke, find out from your caregiver how to quit. If you do not use tobacco, do not start.    If you choose to drink alcohol, do not exceed 2 drinks per day. One drink is considered to be 12 ounces (355 mL) of beer, 5 ounces (148 mL) of wine, or 1.5 ounces (44 mL) of liquor.   Avoid use of street drugs. Do not share needles with anyone. Ask for help if you need support or instructions about stopping the use of drugs.   High blood pressure causes heart disease and increases the risk of stroke. Blood pressure should be checked at least every 1 to 2 years. Ongoing high blood pressure should be treated with medicines if weight loss and exercise are not effective.   If you are 58 to 58 years old, ask your caregiver if you should take aspirin to prevent heart disease.   Diabetes screening involves taking a blood sample to check your fasting blood sugar level. This should be done once every 3 years, after age 42, if you are within normal weight and without risk factors for diabetes. Testing should be considered at a younger age or be carried out more frequently if you are overweight and have at least 1 risk factor for diabetes.   Colorectal cancer can be detected and often prevented. Most routine colorectal cancer screening begins at the age of 40 and continues through age 23. However, your caregiver may recommend screening at an earlier age if you have risk factors for colon cancer. On a yearly basis, your caregiver may provide home test kits to check for hidden  blood in the stool. Use of a small camera at the end of a tube, to directly examine the colon (sigmoidoscopy or colonoscopy), can detect the earliest forms of colorectal cancer. Talk to your caregiver about this at age 25, when routine screening begins. Direct examination of the colon should be repeated every 5 to 10 years through age 66, unless early forms of pre-cancerous polyps or small growths are found.   Healthy men should no longer receive prostate-specific antigen (PSA) blood tests as part of routine cancer screening. Consult with  your caregiver about prostate cancer screening.   Practice safe sex. Use condoms and avoid high-risk sexual practices to reduce the spread of sexually transmitted infections (STIs).   Use sunscreen with a sun protection factor (SPF) of 30 or greater. Apply sunscreen liberally and repeatedly throughout the day. You should seek shade when your shadow is shorter than you. Protect yourself by wearing long sleeves, pants, a wide-brimmed hat, and sunglasses year round, whenever you are outdoors.   Notify your caregiver of new moles or changes in moles, especially if there is a change in shape or color. Also notify your caregiver if a mole is larger than the size of a pencil eraser.   A one-time screening for abdominal aortic aneurysm (AAA) and surgical repair of large AAAs by sound wave imaging (ultrasonography) is recommended for ages 24 to 40 years who are current or former smokers.   Stay current with your immunizations.  Document Released: 09/05/2007 Document Revised: 11/19/2010 Document Reviewed: 08/04/2010 [facility] Patient Information 2012 [place].

## 2011-02-10 ENCOUNTER — Other Ambulatory Visit: Payer: Self-pay | Admitting: Family Medicine

## 2011-02-10 ENCOUNTER — Encounter: Payer: Self-pay | Admitting: *Deleted

## 2011-02-10 ENCOUNTER — Other Ambulatory Visit: Payer: BC Managed Care – PPO

## 2011-02-10 DIAGNOSIS — Z1211 Encounter for screening for malignant neoplasm of colon: Secondary | ICD-10-CM

## 2011-02-10 LAB — FECAL OCCULT BLOOD, IMMUNOCHEMICAL: Fecal Occult Bld: NEGATIVE

## 2011-03-12 ENCOUNTER — Ambulatory Visit (INDEPENDENT_AMBULATORY_CARE_PROVIDER_SITE_OTHER): Payer: BC Managed Care – PPO | Admitting: Family Medicine

## 2011-03-12 ENCOUNTER — Encounter: Payer: Self-pay | Admitting: Family Medicine

## 2011-03-12 VITALS — BP 140/70 | HR 81 | Temp 98.4°F | Ht 67.0 in | Wt 190.8 lb

## 2011-03-12 DIAGNOSIS — J069 Acute upper respiratory infection, unspecified: Secondary | ICD-10-CM

## 2011-03-12 MED ORDER — AZITHROMYCIN 250 MG PO TABS: ORAL_TABLET | ORAL | Status: AC

## 2011-03-12 MED ORDER — HYDROCOD POLST-CHLORPHEN POLST 10-8 MG/5ML PO LQCR: 5.0000 mL | Freq: Two times a day (BID) | ORAL | Status: DC | PRN

## 2011-03-12 NOTE — Patient Instructions (Signed)
Take Zpack as directed if your symptoms get worse over next several days.  Drink lots of fluids.  Treat sympotmatically with Mucinex, nasal saline irrigation, and Tylenol/Ibuprofen. Also try claritin D or zyrtec D over the counter- two times a day as needed ( have to sign for them at pharmacy). You can use warm compresses.  Cough suppressant at night. Call if not improving as expected in 5-7 days.

## 2011-03-12 NOTE — Progress Notes (Signed)
SUBJECTIVE:  Brian Casey is a 60 y.o. male who complains of coryza, congestion and bilateral sinus pain for 2 days. He denies a history of anorexia, chest pain and chills and denies a history of asthma. Patient denies smoke cigarettes.   Patient Active Problem List  Diagnoses  . HYPERTHYROIDISM, I 131  . HYPOTHYROIDISM  . DIABETES MELLITUS, TYPE II  . ANXIETY  . NICOTINE ADDICTION  . DEPRESSION/ ANGER CONTROL  . OTHER ACUTE SINUSITIS  . ACUTE PHARYNGITIS  . PARONYCHIA, FINGER  . Routine general medical examination at a health care facility  . HLD (hyperlipidemia)   Past Medical History  Diagnosis Date  . Depression   . GERD (gastroesophageal reflux disease)   . Diabetes mellitus   . Thyroid disease    Past Surgical History  Procedure Date  . Hemmorhoidectomy 1982   History  Substance Use Topics  . Smoking status: Never Smoker   . Smokeless tobacco: Not on file  . Alcohol Use: Not on file   Family History  Problem Relation Age of Onset  . Cancer Mother     colon  . Hypertension Mother   . Diabetes Mother   . Depression Mother   . Hyperthyroidism Father   . Hypertension Father   . Cancer Father     prostate  . Diabetes Maternal Grandmother   . Cancer Maternal Grandmother     lung, non smoker   Allergies  Allergen Reactions  . Bupropion Hcl     REACTION: Severe constipation, insomnia  . Penicillins     REACTION: Vomiting   Current Outpatient Prescriptions on File Prior to Visit  Medication Sig Dispense Refill  . ALPRAZolam (XANAX) 0.25 MG tablet Take 0.25 mg by mouth 2 (two) times daily as needed.        . clobetasol (TEMOVATE) 0.05 % ointment Apply 1 application topically 2 (two) times daily.  30 g  1  . levothyroxine (SYNTHROID, LEVOTHROID) 100 MCG tablet Take 1 tablet (100 mcg total) by mouth daily.  30 tablet  11  . omeprazole (PRILOSEC) 20 MG capsule Take 20 mg by mouth daily.        . simvastatin (ZOCOR) 40 MG tablet Take 1 tablet (40 mg total) by  mouth at bedtime.  30 tablet  11   The PMH, PSH, Social History, Family History, Medications, and allergies have been reviewed in [facility], and have been updated if relevant.  OBJECTIVE: BP 140/70  Pulse 81  Temp(Src) 98.4 F (36.9 C) (Oral)  Ht _0  (1.702 m)  Wt 190 lb 12 oz (86.524 kg)  BMI 29.88 kg/m2  BP 140/70  Pulse 81  Temp(Src) 98.4 F (36.9 C) (Oral)  Ht _1  (1.702 m)  Wt 190 lb 12 oz (86.524 kg)  BMI 29.88 kg/m2  He appears well, vital signs are as noted. Ears normal.  Throat and pharynx normal.  Neck supple. No adenopathy in the neck. Nose is congested. Sinuses non tender. The chest is clear, without wheezes or rales.  ASSESSMENT:  viral upper respiratory illness  PLAN: Symptomatic therapy suggested: push fluids, rest and return office visit prn if symptoms persist or worsen. Lack of antibiotic effectiveness discussed with him. Call or return to clinic prn if these symptoms worsen or fail to improve as anticipated.

## 2011-12-10 ENCOUNTER — Encounter: Payer: Self-pay | Admitting: Gastroenterology

## 2012-02-08 ENCOUNTER — Other Ambulatory Visit: Payer: Self-pay

## 2012-02-08 DIAGNOSIS — E039 Hypothyroidism, unspecified: Secondary | ICD-10-CM

## 2012-02-08 DIAGNOSIS — E785 Hyperlipidemia, unspecified: Secondary | ICD-10-CM

## 2012-02-08 MED ORDER — SIMVASTATIN 40 MG PO TABS: 40.0000 mg | ORAL_TABLET | Freq: Every day | ORAL | Status: DC

## 2012-02-08 MED ORDER — LEVOTHYROXINE SODIUM 100 MCG PO TABS: 100.0000 ug | ORAL_TABLET | Freq: Every day | ORAL | Status: DC

## 2012-02-08 NOTE — Telephone Encounter (Signed)
Pt has CPX scheduled 04/07/12; request refill simvastatin and levothyroxine to [facility] until seen. Advised pt done.

## 2012-02-10 ENCOUNTER — Ambulatory Visit (INDEPENDENT_AMBULATORY_CARE_PROVIDER_SITE_OTHER): Payer: BC Managed Care – PPO | Admitting: Family Medicine

## 2012-02-10 ENCOUNTER — Encounter: Payer: Self-pay | Admitting: Family Medicine

## 2012-02-10 VITALS — BP 134/72 | HR 60 | Temp 97.4°F | Ht 67.0 in | Wt 193.0 lb

## 2012-02-10 DIAGNOSIS — E119 Type 2 diabetes mellitus without complications: Secondary | ICD-10-CM

## 2012-02-10 DIAGNOSIS — Z23 Encounter for immunization: Secondary | ICD-10-CM

## 2012-02-10 DIAGNOSIS — Z125 Encounter for screening for malignant neoplasm of prostate: Secondary | ICD-10-CM

## 2012-02-10 DIAGNOSIS — E785 Hyperlipidemia, unspecified: Secondary | ICD-10-CM

## 2012-02-10 DIAGNOSIS — E039 Hypothyroidism, unspecified: Secondary | ICD-10-CM

## 2012-02-10 DIAGNOSIS — Z1211 Encounter for screening for malignant neoplasm of colon: Secondary | ICD-10-CM

## 2012-02-10 DIAGNOSIS — Z0000 Encounter for general adult medical examination without abnormal findings: Secondary | ICD-10-CM

## 2012-02-10 LAB — COMPREHENSIVE METABOLIC PANEL
AST: 21 U/L (ref 0–37)
Albumin: 4.5 g/dL (ref 3.5–5.2)
BUN: 8 mg/dL (ref 6–23)
Calcium: 9.6 mg/dL (ref 8.4–10.5)
Chloride: 101 mEq/L (ref 96–112)
Potassium: 4.6 mEq/L (ref 3.5–5.1)

## 2012-02-10 LAB — TSH: TSH: 0.67 u[IU]/mL (ref 0.35–5.50)

## 2012-02-10 LAB — LIPID PANEL
Cholesterol: 217 mg/dL — ABNORMAL HIGH (ref 0–200)
HDL: 46.7 mg/dL (ref >39.00)
Triglycerides: 165 mg/dL — ABNORMAL HIGH (ref 0.0–149.0)

## 2012-02-10 LAB — LDL CHOLESTEROL, DIRECT: Direct LDL: 140.6 mg/dL

## 2012-02-10 MED ORDER — LEVOTHYROXINE SODIUM 100 MCG PO TABS: 100.0000 ug | ORAL_TABLET | Freq: Every day | ORAL | Status: DC

## 2012-02-10 MED ORDER — SIMVASTATIN 40 MG PO TABS: 40.0000 mg | ORAL_TABLET | Freq: Every day | ORAL | Status: DC

## 2012-02-10 NOTE — Patient Instructions (Addendum)
We will call you with your lab work.  I hope you have a wonderful holiday.

## 2012-02-10 NOTE — Progress Notes (Signed)
Subjective:    Patient ID: Brian Casey, male    DOB: [birth year], 61 y.o.   MRN: [id number]  HPI  61 yo very pleasant male here for CPX.  Hypothyroidism- On synthroid 100 mcg. Denies any symptoms of hypo or hyperthyroidism. Lab Results  Component Value Date   TSH 0.44 01/21/2011    Lipid panel- On Zocor 40 mg daily.  Denies any myalgias. Lab Results  Component Value Date   CHOL 159 01/21/2011   HDL 51.90 01/21/2011   LDLCALC 86 01/21/2011   TRIG 105.0 01/21/2011   CHOLHDL 3 01/21/2011   DM- diet controlled.  Does not check CBGs routinely. Lab Results  Component Value Date   HGBA1C 5.6 01/21/2011   He may be losing his insurance next and will have to go the [place].  He will let us know.  Patient Active Problem List  Diagnosis  . HYPOTHYROIDISM  . DIABETES MELLITUS, TYPE II  . ANXIETY  . NICOTINE ADDICTION  . DEPRESSION/ ANGER CONTROL  . Routine general medical examination at a health care facility  . HLD (hyperlipidemia)   Past Medical History  Diagnosis Date  . Depression   . GERD (gastroesophageal reflux disease)   . Diabetes mellitus   . Thyroid disease    Past Surgical History  Procedure Date  . Hemmorhoidectomy 1982   History  Substance Use Topics  . Smoking status: Never Smoker   . Smokeless tobacco: Not on file  . Alcohol Use: Not on file   Family History  Problem Relation Age of Onset  . Cancer Mother     colon  . Hypertension Mother   . Diabetes Mother   . Depression Mother   . Hyperthyroidism Father   . Hypertension Father   . Cancer Father     prostate  . Diabetes Maternal Grandmother   . Cancer Maternal Grandmother     lung, non smoker   Allergies  Allergen Reactions  . Bupropion Hcl     REACTION: Severe constipation, insomnia  . Penicillins     REACTION: Vomiting   Current Outpatient Prescriptions on File Prior to Visit  Medication Sig Dispense Refill  . ALPRAZolam (XANAX) 0.25 MG tablet Take 0.25 mg by mouth 2 (two)  times daily as needed.        . chlorpheniramine-HYDROcodone (TUSSIONEX PENNKINETIC ER) 10-8 MG/5ML LQCR Take 5 mLs by mouth every 12 (twelve) hours as needed.  140 mL  0  . clobetasol (TEMOVATE) 0.05 % ointment Apply 1 application topically 2 (two) times daily.  30 g  1  . levothyroxine (SYNTHROID, LEVOTHROID) 100 MCG tablet Take 1 tablet (100 mcg total) by mouth daily.  30 tablet  2  . omeprazole (PRILOSEC) 20 MG capsule Take 20 mg by mouth daily.        . simvastatin (ZOCOR) 40 MG tablet Take 1 tablet (40 mg total) by mouth at bedtime.  30 tablet  2   The PMH, PSH, Social History, Family History, Medications, and allergies have been reviewed in [facility], and have been updated if relevant.   Review of Systems  See HPI Patient reports no  vision/ hearing changes,anorexia, weight change, fever ,adenopathy, persistant / recurrent hoarseness, swallowing issues, chest pain, edema,persistant / recurrent cough, hemoptysis, dyspnea(rest, exertional, paroxysmal nocturnal), gastrointestinal  bleeding (melena, rectal bleeding), abdominal pain, excessive heart burn, GU symptoms(dysuria, hematuria, pyuria, voiding/incontinence  Issues) syncope, focal weakness, severe memory loss, concerning skin lesions, depression, anxiety, abnormal bruising/bleeding, major joint swelling.  Objective:   Physical Exam BP 134/72  Pulse 60  Temp 97.4 F (36.3 C)  Ht _0  (1.702 m)  Wt 193 lb (87.544 kg)  BMI 30.23 kg/m2 General:  overweght male in NAD Eyes:  PERRL Ears:  External ear exam shows no significant lesions or deformities.  Otoscopic examination reveals clear canals, tympanic membranes are intact bilaterally without bulging, retraction, inflammation or discharge. Hearing is grossly normal bilaterally. Nose:  External nasal examination shows no deformity or inflammation. Nasal mucosa are pink and moist without lesions or exudates. Mouth:  Oral mucosa and oropharynx without lesions or exudates.  Teeth in  good repair. Neck:  no carotid bruit or thyromegaly no cervical or supraclavicular lymphadenopathy  Lungs:  Normal respiratory effort, chest expands symmetrically. Lungs are clear to auscultation, no crackles or wheezes. Heart:  Normal rate and regular rhythm. S1 and S2 normal without gallop, murmur, click, rub or other extra sounds. Abdomen:  Bowel sounds positive,abdomen soft and non-tender without masses, organomegaly or hernias noted. Genitalia:  Testes bilaterally descended without nodularity, tenderness or masses. No scrotal masses or lesions. No penis lesions or urethral discharge. Pulses:  R and L posterior tibial pulses are full and equal bilaterally  Extremities:  no edema    Assessment & Plan:   1. Need for prophylactic vaccination and inoculation against influenza    2. Routine general medical examination at a health care facility  Reviewed preventive care protocols, scheduled due services, and updated immunizations Discussed nutrition, exercise, diet, and healthy lifestyle.  IFOB Flu shot CMET  3. DIABETES MELLITUS, TYPE II  Diet controlled.      4. HYPOTHYROIDISM  Stable.  Continue current dose of Synthroid. Recheck thyroid function today. levothyroxine (SYNTHROID, LEVOTHROID) 100 MCG tablet  5. Hyperlipidemia Continue current dose of Zocor. Recheck lipid panel today. simvastatin (ZOCOR) 40 MG tablet Lipid panel

## 2012-02-10 NOTE — Addendum Note (Signed)
Addended by: Ricki Miller on: 02/10/2012 09:55 AM   Modules accepted: Orders

## 2012-02-11 ENCOUNTER — Encounter: Payer: Self-pay | Admitting: *Deleted

## 2012-02-13 ENCOUNTER — Other Ambulatory Visit: Payer: Self-pay | Admitting: Family Medicine

## 2012-03-31 ENCOUNTER — Other Ambulatory Visit: Payer: BC Managed Care – PPO

## 2012-04-07 ENCOUNTER — Encounter: Payer: BC Managed Care – PPO | Admitting: Family Medicine

## 2012-05-12 ENCOUNTER — Other Ambulatory Visit: Payer: Self-pay | Admitting: Family Medicine

## 2012-08-18 ENCOUNTER — Encounter: Payer: Self-pay | Admitting: Gastroenterology

## 2012-11-13 ENCOUNTER — Other Ambulatory Visit: Payer: Self-pay | Admitting: Family Medicine

## 2012-12-11 ENCOUNTER — Other Ambulatory Visit: Payer: Self-pay | Admitting: Family Medicine

## 2012-12-29 ENCOUNTER — Ambulatory Visit (INDEPENDENT_AMBULATORY_CARE_PROVIDER_SITE_OTHER): Payer: BLUE CROSS/BLUE SHIELD

## 2012-12-29 DIAGNOSIS — Z23 Encounter for immunization: Secondary | ICD-10-CM

## 2013-04-05 ENCOUNTER — Other Ambulatory Visit: Payer: Self-pay | Admitting: Family Medicine

## 2013-04-05 DIAGNOSIS — E119 Type 2 diabetes mellitus without complications: Secondary | ICD-10-CM

## 2013-04-05 DIAGNOSIS — Z125 Encounter for screening for malignant neoplasm of prostate: Secondary | ICD-10-CM

## 2013-04-05 DIAGNOSIS — E039 Hypothyroidism, unspecified: Secondary | ICD-10-CM

## 2013-04-05 DIAGNOSIS — E785 Hyperlipidemia, unspecified: Secondary | ICD-10-CM

## 2013-04-05 DIAGNOSIS — Z0000 Encounter for general adult medical examination without abnormal findings: Secondary | ICD-10-CM

## 2013-04-06 ENCOUNTER — Other Ambulatory Visit (INDEPENDENT_AMBULATORY_CARE_PROVIDER_SITE_OTHER): Payer: BLUE CROSS/BLUE SHIELD

## 2013-04-06 DIAGNOSIS — Z0000 Encounter for general adult medical examination without abnormal findings: Secondary | ICD-10-CM

## 2013-04-06 DIAGNOSIS — Z125 Encounter for screening for malignant neoplasm of prostate: Secondary | ICD-10-CM

## 2013-04-06 DIAGNOSIS — E785 Hyperlipidemia, unspecified: Secondary | ICD-10-CM

## 2013-04-06 DIAGNOSIS — E119 Type 2 diabetes mellitus without complications: Secondary | ICD-10-CM

## 2013-04-06 DIAGNOSIS — E039 Hypothyroidism, unspecified: Secondary | ICD-10-CM

## 2013-04-06 LAB — LIPID PANEL
CHOL/HDL RATIO: 5
CHOLESTEROL: 182 mg/dL (ref 0–200)
HDL: 40.1 mg/dL (ref >39.00)
LDL CALC: 115 mg/dL — AB (ref 0–99)
TRIGLYCERIDES: 135 mg/dL (ref 0.0–149.0)
VLDL: 27 mg/dL (ref 0.0–40.0)

## 2013-04-06 LAB — COMPREHENSIVE METABOLIC PANEL
ALBUMIN: 4 g/dL (ref 3.5–5.2)
ALK PHOS: 55 U/L (ref 39–117)
ALT: 41 U/L (ref 0–53)
AST: 34 U/L (ref 0–37)
BILIRUBIN TOTAL: 0.8 mg/dL (ref 0.3–1.2)
BUN: 10 mg/dL (ref 6–23)
CO2: 28 mEq/L (ref 19–32)
Calcium: 8.9 mg/dL (ref 8.4–10.5)
Chloride: 101 mEq/L (ref 96–112)
Creatinine, Ser: 1.1 mg/dL (ref 0.4–1.5)
GFR: 73.88 mL/min (ref >60.00)
GLUCOSE: 151 mg/dL — AB (ref 70–99)
POTASSIUM: 4.5 meq/L (ref 3.5–5.1)
SODIUM: 136 meq/L (ref 135–145)
TOTAL PROTEIN: 6.7 g/dL (ref 6.0–8.3)

## 2013-04-06 LAB — TSH: TSH: 1.47 u[IU]/mL (ref 0.35–5.50)

## 2013-04-06 LAB — MICROALBUMIN / CREATININE URINE RATIO
CREATININE, U: 129.5 mg/dL
MICROALB UR: 0.1 mg/dL (ref 0.0–1.9)
Microalb Creat Ratio: 0.1 mg/g (ref 0.0–30.0)

## 2013-04-06 LAB — PSA: PSA: 0.36 ng/mL (ref 0.10–4.00)

## 2013-04-06 LAB — HEMOGLOBIN A1C: Hgb A1c MFr Bld: 6.7 % — ABNORMAL HIGH (ref 4.6–6.5)

## 2013-04-13 ENCOUNTER — Ambulatory Visit (INDEPENDENT_AMBULATORY_CARE_PROVIDER_SITE_OTHER)
Admission: RE | Admit: 2013-04-13 | Discharge: 2013-04-13 | Disposition: A | Discharge status: Home / Self Care | Payer: BLUE CROSS/BLUE SHIELD | Source: Ambulatory Visit | Attending: Family Medicine | Admitting: Family Medicine

## 2013-04-13 ENCOUNTER — Encounter: Payer: Self-pay | Admitting: Gastroenterology

## 2013-04-13 ENCOUNTER — Encounter: Payer: Self-pay | Admitting: Family Medicine

## 2013-04-13 ENCOUNTER — Ambulatory Visit (INDEPENDENT_AMBULATORY_CARE_PROVIDER_SITE_OTHER): Payer: BLUE CROSS/BLUE SHIELD | Admitting: Family Medicine

## 2013-04-13 VITALS — BP 130/72 | HR 66 | Temp 97.7°F | Ht 67.0 in | Wt 209.5 lb

## 2013-04-13 DIAGNOSIS — Z86010 Personal history of colonic polyps: Secondary | ICD-10-CM

## 2013-04-13 DIAGNOSIS — M545 Low back pain, unspecified: Secondary | ICD-10-CM

## 2013-04-13 DIAGNOSIS — F17200 Nicotine dependence, unspecified, uncomplicated: Secondary | ICD-10-CM

## 2013-04-13 DIAGNOSIS — Z0000 Encounter for general adult medical examination without abnormal findings: Secondary | ICD-10-CM

## 2013-04-13 DIAGNOSIS — E119 Type 2 diabetes mellitus without complications: Secondary | ICD-10-CM

## 2013-04-13 DIAGNOSIS — E039 Hypothyroidism, unspecified: Secondary | ICD-10-CM

## 2013-04-13 DIAGNOSIS — E785 Hyperlipidemia, unspecified: Secondary | ICD-10-CM

## 2013-04-13 DIAGNOSIS — Z1211 Encounter for screening for malignant neoplasm of colon: Secondary | ICD-10-CM

## 2013-04-13 MED ORDER — LEVOTHYROXINE SODIUM 100 MCG PO TABS: ORAL_TABLET | ORAL | Status: DC

## 2013-04-13 MED ORDER — CLOBETASOL PROPIONATE 0.05 % EX OINT: 1.0000 "application " | TOPICAL_OINTMENT | Freq: Two times a day (BID) | CUTANEOUS | Status: DC

## 2013-04-13 MED ORDER — METFORMIN HCL 500 MG PO TABS: 500.0000 mg | ORAL_TABLET | Freq: Every day | ORAL | Status: DC

## 2013-04-13 MED ORDER — OMEPRAZOLE 20 MG PO CPDR: 20.0000 mg | DELAYED_RELEASE_CAPSULE | Freq: Every day | ORAL | Status: DC

## 2013-04-13 NOTE — Assessment & Plan Note (Signed)
Deteriorated. Discussed eat right diet- hand out given. Start Metformin 500 mg daily with breakfast.  Follow up labs in 3 months.

## 2013-04-13 NOTE — Assessment & Plan Note (Signed)
No red flag symptoms but given duration of symptoms and localized pain, with get xray today.

## 2013-04-13 NOTE — Assessment & Plan Note (Signed)
Stable on current rx.

## 2013-04-13 NOTE — Assessment & Plan Note (Signed)
Stable on current rx. No changes.

## 2013-04-13 NOTE — Assessment & Plan Note (Signed)
Reviewed preventive care protocols, scheduled due services, and updated immunizations Discussed nutrition, exercise, diet, and healthy lifestyle.  H/o colon polyps, will refer for colonoscopy.

## 2013-04-13 NOTE — Assessment & Plan Note (Signed)
Improving. Cut back to 1 pack per week!

## 2013-04-13 NOTE — Patient Instructions (Addendum)
Great to see you. We will call you with your colonoscopy appointment or you can stop by to see Rosaria Ferries on your way out. We are starting Metformin 500 mg daily. Work on [employer] as we discussed. Come back in 3 months for follow up labs.

## 2013-04-13 NOTE — Progress Notes (Signed)
Subjective:    Patient ID: Brian Casey, male    DOB: 1950/09/14, 63 y.o.   MRN: [id number]  HPI  63 yo very pleasant male here for CPX.  Doing well.  Has had more left sided back pain.  For years, intermittent left back pain that "works it self out," getting worse over past year. No radiation of pain or numbness in legs.  Tobacco abuse- cut back to 1 pack per week!  Last colonoscopy by Dr Deatra Ina on 11/18/2006- polyps, recall in 5 years. Denies blood in stool. No changes in bowel habits.  Hypothyroidism- On synthroid 100 mcg. Denies any symptoms of hypo or hyperthyroidism. Lab Results  Component Value Date   TSH 1.47 04/06/2013    Lipid panel- On Zocor 40 mg daily.  Denies any myalgias. Lab Results  Component Value Date   CHOL 182 04/06/2013   HDL 40.10 04/06/2013   LDLCALC 115* 04/06/2013   LDLDIRECT 140.6 02/10/2012   TRIG 135.0 04/06/2013   CHOLHDL 5 04/06/2013   DM- deteriorated.  Has been diet controlled for over 6 years.  Admits to drinking more ETOH and eating more carbs and sugars. Does not check his CBGs. Lab Results  Component Value Date   HGBA1C 6.7* 04/06/2013   Lab Results  Component Value Date   PSA 0.36 04/06/2013   PSA 0.40 02/10/2012   PSA 0.35 06/20/2008     Patient Active Problem List   Diagnosis Date Noted  . Routine general medical examination at a health care facility 01/26/2011  . HLD (hyperlipidemia) 01/26/2011  . ANXIETY 12/26/2008  . HYPOTHYROIDISM 03/02/2007  . DIABETES MELLITUS, TYPE II 03/02/2007  . NICOTINE ADDICTION 03/02/2007  . DEPRESSION/ ANGER CONTROL 03/02/2007   Past Medical History  Diagnosis Date  . Depression   . GERD (gastroesophageal reflux disease)   . Diabetes mellitus   . Thyroid disease    Past Surgical History  Procedure Laterality Date  . Hemmorhoidectomy  1982   History  Substance Use Topics  . Smoking status: Never Smoker   . Smokeless tobacco: Not on file  . Alcohol Use: Not on file   Family  History  Problem Relation Age of Onset  . Cancer Mother     colon  . Hypertension Mother   . Diabetes Mother   . Depression Mother   . Hyperthyroidism Father   . Hypertension Father   . Cancer Father     prostate  . Diabetes Maternal Grandmother   . Cancer Maternal Grandmother     lung, non smoker   Allergies  Allergen Reactions  . Bupropion Hcl     REACTION: Severe constipation, insomnia  . Penicillins     REACTION: Vomiting   Current Outpatient Prescriptions on File Prior to Visit  Medication Sig Dispense Refill  . ALPRAZolam (XANAX) 0.25 MG tablet Take 0.25 mg by mouth 2 (two) times daily as needed.        . simvastatin (ZOCOR) 40 MG tablet Take 1 tablet (40 mg total) by mouth at bedtime.  30 tablet  11   No current facility-administered medications on file prior to visit.   The PMH, PSH, Social History, Family History, Medications, and allergies have been reviewed in [facility], and have been updated if relevant.   Review of Systems  See HPI Patient reports no  vision/ hearing changes,anorexia, weight change, fever ,adenopathy, persistant / recurrent hoarseness, swallowing issues, chest pain, edema,persistant / recurrent cough, hemoptysis, dyspnea(rest, exertional, paroxysmal nocturnal), gastrointestinal  bleeding (melena, rectal  bleeding), abdominal pain, excessive heart burn, GU symptoms(dysuria, hematuria, pyuria, voiding/incontinence  Issues) syncope, focal weakness, severe memory loss, concerning skin lesions, depression, anxiety, abnormal bruising/bleeding, major joint swelling.       Objective:   Physical Exam BP 130/72  Pulse 66  Temp(Src) 97.7 F (36.5 C) (Oral)  Ht 5' 7" (1.702 m)  Wt 209 lb 8 oz (95.029 kg)  BMI 32.80 kg/m2  SpO2 96% General:  overweght male in NAD Eyes:  PERRL Ears:  External ear exam shows no significant lesions or deformities.  Otoscopic examination reveals clear canals, tympanic membranes are intact bilaterally without bulging,  retraction, inflammation or discharge. Hearing is grossly normal bilaterally. Nose:  External nasal examination shows no deformity or inflammation. Nasal mucosa are pink and moist without lesions or exudates. Mouth:  Oral mucosa and oropharynx without lesions or exudates.  Teeth in good repair. Neck:  no carotid bruit or thyromegaly no cervical or supraclavicular lymphadenopathy  Lungs:  Normal respiratory effort, chest expands symmetrically. Lungs are clear to auscultation, no crackles or wheezes. Heart:  Normal rate and regular rhythm. S1 and S2 normal without gallop, murmur, click, rub or other extra sounds. Abdomen:  Bowel sounds positive,abdomen soft and non-tender without masses, organomegaly or hernias noted. Genitalia:  Testes bilaterally descended without nodularity, tenderness or masses. No scrotal masses or lesions. No penis lesions or urethral discharge. Pulses:  R and L posterior tibial pulses are full and equal bilaterally  MSK:  SLR neg bilaterally, no TTP over spine, normal gait Extremities:  no edema  Diabetic foot exam: Normal inspection No skin breakdown No calluses  Normal DP pulses Normal sensation to light touch and monofilament Nails normal    Assessment & Plan:

## 2013-04-13 NOTE — Progress Notes (Signed)
Pre-visit discussion using our clinic review tool. No additional management support is needed unless otherwise documented below in the visit note.

## 2013-04-17 ENCOUNTER — Telehealth: Payer: Self-pay

## 2013-04-17 NOTE — Telephone Encounter (Signed)
Pt left v/m requesting cb about lab results. Called pt back [phone], no v/m and no answer.

## 2013-04-19 NOTE — Telephone Encounter (Signed)
Spoke to pt and informed him of results

## 2013-05-26 ENCOUNTER — Ambulatory Visit (AMBULATORY_SURGERY_CENTER): Payer: BLUE CROSS/BLUE SHIELD

## 2013-05-26 VITALS — Ht 68.0 in | Wt 211.6 lb

## 2013-05-26 DIAGNOSIS — Z800 Family history of malignant neoplasm of digestive organs: Secondary | ICD-10-CM

## 2013-05-26 MED ORDER — MOVIPREP 100 G PO SOLR: 1.0000 | Freq: Once | ORAL | Status: DC

## 2013-05-29 ENCOUNTER — Encounter: Payer: Self-pay | Admitting: Gastroenterology

## 2013-05-31 ENCOUNTER — Encounter: Payer: BLUE CROSS/BLUE SHIELD | Admitting: Gastroenterology

## 2013-06-07 ENCOUNTER — Other Ambulatory Visit: Payer: Self-pay | Admitting: Family Medicine

## 2013-06-09 ENCOUNTER — Ambulatory Visit (AMBULATORY_SURGERY_CENTER): Payer: BLUE CROSS/BLUE SHIELD | Admitting: Gastroenterology

## 2013-06-09 ENCOUNTER — Encounter: Payer: Self-pay | Admitting: Gastroenterology

## 2013-06-09 VITALS — BP 131/76 | HR 55 | Temp 98.0°F | Resp 20 | Ht 68.0 in | Wt 211.0 lb

## 2013-06-09 DIAGNOSIS — Z86010 Personal history of colonic polyps: Secondary | ICD-10-CM

## 2013-06-09 DIAGNOSIS — K5730 Diverticulosis of large intestine without perforation or abscess without bleeding: Secondary | ICD-10-CM

## 2013-06-09 DIAGNOSIS — Z800 Family history of malignant neoplasm of digestive organs: Secondary | ICD-10-CM

## 2013-06-09 MED ORDER — SODIUM CHLORIDE 0.9 % IV SOLN: 500.0000 mL | INTRAVENOUS | Status: DC

## 2013-06-09 NOTE — Progress Notes (Signed)
Report to pacu rn, vss, bbs=clear 

## 2013-06-09 NOTE — Op Note (Signed)
[place]  [address] [place], [address]   COLONOSCOPY PROCEDURE REPORT  PATIENT: Brian Casey, Brian Casey  MR#: [id number] BIRTHDATE: Sep 21, 1950 , 63  yrs. old GENDER: Male ENDOSCOPIST: Inda Castle, MD REFERRED [place]:SHUOH Aron, M.D. PROCEDURE DATE:  06/09/2013 PROCEDURE:   Colonoscopy, diagnostic First Screening Colonoscopy - Avg.  risk and is 50 yrs.  old or older - No.  Prior Negative Screening - Now for repeat screening. N/A  History of Adenoma - Now for follow-up colonoscopy & has been > or = to 3 yrs.  Yes hx of adenoma.  Has been 3 or more years since last colonoscopy.  Polyps Removed Today? No.  Recommend repeat exam, <10 yrs? Yes.  High risk (family or personal hx). ASA CLASS:   Class II INDICATIONS:Patient's personal history of colon polyps and Patient's immediate family history of colon cancer. 2008. MEDICATIONS: MAC sedation, administered by CRNA and Propofol (Diprivan) 270 mg IV  DESCRIPTION OF PROCEDURE:   After the risks benefits and alternatives of the procedure were thoroughly explained, informed consent was obtained.  A digital rectal exam revealed no abnormalities of the rectum.   The LB [id number]  endoscope was introduced through the anus and advanced to the cecum, which was identified by both the appendix and ileocecal valve. No adverse events experienced.   The quality of the prep was excellent using Suprep  The instrument was then slowly withdrawn as the colon was fully examined.      COLON FINDINGS: Mild diverticulosis was noted in the sigmoid colon. The colon was otherwise normal.  There was no diverticulosis, inflammation, polyps or cancers unless previously stated. Retroflexed views revealed no abnormalities. The time to cecum=3 minutes 02 seconds.  Withdrawal time=9 minutes 03 seconds.  The scope was withdrawn and the procedure completed. COMPLICATIONS: There were no complications.  ENDOSCOPIC IMPRESSION: 1.   Mild  diverticulosis was noted in the sigmoid colon 2.   The colon was otherwise normal  RECOMMENDATIONS: Given your significant family history of colon cancer, you should have a repeat colonoscopy in 5 years   eSigned:  Inda Castle, MD 06/09/2013 11:14 AM   cc:   PATIENT NAME:  Brian Casey MR#: [id number]

## 2013-06-09 NOTE — Patient Instructions (Signed)
YOU HAD AN ENDOSCOPIC PROCEDURE TODAY AT [facility]: Refer to the procedure report that was given to you for any specific questions about what was found during the examination.  If the procedure report does not answer your questions, please call your gastroenterologist to clarify.  If you requested that your care partner not be given the details of your procedure findings, then the procedure report has been included in a sealed envelope for you to review at your convenience later.  YOU SHOULD EXPECT: Some feelings of bloating in the abdomen. Passage of more gas than usual.  Walking can help get rid of the air that was put into your GI tract during the procedure and reduce the bloating. If you had a lower endoscopy (such as a colonoscopy or flexible sigmoidoscopy) you may notice spotting of blood in your stool or on the toilet paper. If you underwent a bowel prep for your procedure, then you may not have a normal bowel movement for a few days.  DIET: Your first meal following the procedure should be a light meal and then it is ok to progress to your normal diet.  A half-sandwich or bowl of soup is an example of a good first meal.  Heavy or fried foods are harder to digest and may make you feel nauseous or bloated.  Likewise meals heavy in dairy and vegetables can cause extra gas to form and this can also increase the bloating.  Drink plenty of fluids but you should avoid alcoholic beverages for 24 hours.  ACTIVITY: Your care partner should take you home directly after the procedure.  You should plan to take it easy, moving slowly for the rest of the day.  You can resume normal activity the day after the procedure however you should NOT DRIVE or use heavy machinery for 24 hours (because of the sedation medicines used during the test).    SYMPTOMS TO REPORT IMMEDIATELY: A gastroenterologist can be reached at any hour.  During normal business hours, 8:30 AM to 5:00 PM Monday through Friday,  call [phone].  After hours and on weekends, please call the GI answering service at [phone] who will take a message and have the physician on call contact you.   Following lower endoscopy (colonoscopy or flexible sigmoidoscopy):  Excessive amounts of blood in the stool  Significant tenderness or worsening of abdominal pains  Swelling of the abdomen that is new, acute  Fever of 100F or higher  FOLLOW UP: If any biopsies were taken you will be contacted by phone or by letter within the next 1-3 weeks.  Call your gastroenterologist if you have not heard about the biopsies in 3 weeks.  Our staff will call the home number listed on your records the next business day following your procedure to check on you and address any questions or concerns that you may have at that time regarding the information given to you following your procedure. This is a courtesy call and so if there is no answer at the home number and we have not heard from you through the emergency physician on call, we will assume that you have returned to your regular daily activities without incident.  SIGNATURES/CONFIDENTIALITY: You and/or your care partner have signed paperwork which will be entered into your electronic medical record.  These signatures attest to the fact that that the information above on your After Visit Summary has been reviewed and is understood.  Full responsibility of the confidentiality of this  discharge information lies with you and/or your care-partner.  Recommendations Next colonoscopy in 5 years

## 2013-06-12 ENCOUNTER — Telehealth: Payer: Self-pay | Admitting: *Deleted

## 2013-06-12 NOTE — Telephone Encounter (Signed)
  Follow up Call-  Call back number 06/09/2013  Post procedure Call Back phone  # [phone]  Permission to leave phone message Yes     Patient questions:  Do you have a fever, pain , or abdominal swelling? no Pain Score  0 *  Have you tolerated food without any problems? yes  Have you been able to return to your normal activities? no  Do you have any questions about your discharge instructions: Diet   no Medications  no Follow up visit  no  Do you have questions or concerns about your Care? no  Actions: * If pain score is 4 or above: No action needed, pain <4.

## 2013-07-05 ENCOUNTER — Other Ambulatory Visit: Payer: Self-pay | Admitting: Family Medicine

## 2013-08-01 ENCOUNTER — Other Ambulatory Visit: Payer: Self-pay | Admitting: Family Medicine

## 2013-08-18 ENCOUNTER — Encounter: Payer: Self-pay | Admitting: Family Medicine

## 2013-08-18 ENCOUNTER — Ambulatory Visit (INDEPENDENT_AMBULATORY_CARE_PROVIDER_SITE_OTHER): Payer: BLUE CROSS/BLUE SHIELD | Admitting: Family Medicine

## 2013-08-18 ENCOUNTER — Telehealth: Payer: Self-pay

## 2013-08-18 VITALS — BP 150/92 | HR 70 | Temp 97.7°F | Ht 67.0 in | Wt 206.5 lb

## 2013-08-18 DIAGNOSIS — L0390 Cellulitis, unspecified: Secondary | ICD-10-CM | POA: Insufficient documentation

## 2013-08-18 DIAGNOSIS — T148XXA Other injury of unspecified body region, initial encounter: Secondary | ICD-10-CM

## 2013-08-18 DIAGNOSIS — L0291 Cutaneous abscess, unspecified: Secondary | ICD-10-CM

## 2013-08-18 MED ORDER — SULFAMETHOXAZOLE-TMP DS 800-160 MG PO TABS: 1.0000 | ORAL_TABLET | Freq: Two times a day (BID) | ORAL | Status: AC

## 2013-08-18 NOTE — Assessment & Plan Note (Signed)
Well healed. Td given today.

## 2013-08-18 NOTE — Assessment & Plan Note (Signed)
PCN allergic. Bactrim DS- 1 tab po twice daily x 7 days, continue soaks. Call or return to clinic prn if these symptoms worsen or fail to improve as anticipated.
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
## 2013-08-18 NOTE — Patient Instructions (Signed)
Great to see you. Please take bactrim - 1 tablet twice daily for 7 days. Continue soapy water soaks. Let me know if you're not better next weeks.

## 2013-08-18 NOTE — Progress Notes (Signed)
Pre visit review using our clinic review tool, if applicable. No additional management support is needed unless otherwise documented below in the visit note.

## 2013-08-18 NOTE — Addendum Note (Signed)
Addended by: Modena Nunnery on: 08/18/2013 03:37 PM   Modules accepted: Orders

## 2013-10-02 ENCOUNTER — Telehealth: Payer: Self-pay | Admitting: *Deleted

## 2013-10-02 NOTE — Telephone Encounter (Signed)
Lm on pts vm requesting a call back to schedule DIABETIC BUNDLE OV-needs BP check and LDL

## 2013-10-03 ENCOUNTER — Other Ambulatory Visit: Payer: Self-pay | Admitting: Family Medicine

## 2013-10-03 DIAGNOSIS — E785 Hyperlipidemia, unspecified: Secondary | ICD-10-CM

## 2013-10-04 ENCOUNTER — Other Ambulatory Visit (INDEPENDENT_AMBULATORY_CARE_PROVIDER_SITE_OTHER): Payer: BLUE CROSS/BLUE SHIELD

## 2013-10-04 DIAGNOSIS — E119 Type 2 diabetes mellitus without complications: Secondary | ICD-10-CM

## 2013-10-04 DIAGNOSIS — E785 Hyperlipidemia, unspecified: Secondary | ICD-10-CM

## 2013-10-04 LAB — COMPREHENSIVE METABOLIC PANEL
ALT: 39 U/L (ref 0–53)
AST: 33 U/L (ref 0–37)
Albumin: 4.1 g/dL (ref 3.5–5.2)
Alkaline Phosphatase: 48 U/L (ref 39–117)
BILIRUBIN TOTAL: 0.9 mg/dL (ref 0.2–1.2)
BUN: 10 mg/dL (ref 6–23)
CO2: 26 mEq/L (ref 19–32)
Calcium: 9.2 mg/dL (ref 8.4–10.5)
Chloride: 102 mEq/L (ref 96–112)
Creatinine, Ser: 1 mg/dL (ref 0.4–1.5)
GFR: 80.61 mL/min (ref >60.00)
GLUCOSE: 121 mg/dL — AB (ref 70–99)
Potassium: 4.3 mEq/L (ref 3.5–5.1)
Sodium: 136 mEq/L (ref 135–145)
Total Protein: 7.1 g/dL (ref 6.0–8.3)

## 2013-10-04 LAB — LIPID PANEL
CHOL/HDL RATIO: 5
CHOLESTEROL: 202 mg/dL — AB (ref 0–200)
HDL: 42.1 mg/dL (ref >39.00)
LDL Cholesterol: 133 mg/dL — ABNORMAL HIGH (ref 0–99)
NonHDL: 159.9
TRIGLYCERIDES: 136 mg/dL (ref 0.0–149.0)
VLDL: 27.2 mg/dL (ref 0.0–40.0)

## 2013-10-04 LAB — HEMOGLOBIN A1C: Hgb A1c MFr Bld: 6.3 % (ref 4.6–6.5)

## 2013-10-06 ENCOUNTER — Telehealth: Payer: Self-pay | Admitting: Family Medicine

## 2013-10-06 NOTE — Telephone Encounter (Signed)
Pt returned your call and asked that you call him back on his office phone. It is ok to leave a mssg on his office phone. Thank you.

## 2013-10-06 NOTE — Telephone Encounter (Signed)
i spoke to pt on yesterday and informed him of results. I advised him that i would contact him again if there were any further actions to occur

## 2013-10-11 ENCOUNTER — Encounter: Payer: Self-pay | Admitting: Family Medicine

## 2013-10-11 ENCOUNTER — Ambulatory Visit (INDEPENDENT_AMBULATORY_CARE_PROVIDER_SITE_OTHER): Payer: BLUE CROSS/BLUE SHIELD | Admitting: Family Medicine

## 2013-10-11 ENCOUNTER — Ambulatory Visit (INDEPENDENT_AMBULATORY_CARE_PROVIDER_SITE_OTHER)
Admission: RE | Admit: 2013-10-11 | Discharge: 2013-10-11 | Disposition: A | Discharge status: Home / Self Care | Payer: BLUE CROSS/BLUE SHIELD | Source: Ambulatory Visit | Attending: Family Medicine | Admitting: Family Medicine

## 2013-10-11 VITALS — BP 148/84 | HR 70 | Temp 98.0°F | Wt 208.5 lb

## 2013-10-11 DIAGNOSIS — K429 Umbilical hernia without obstruction or gangrene: Secondary | ICD-10-CM

## 2013-10-11 DIAGNOSIS — K439 Ventral hernia without obstruction or gangrene: Secondary | ICD-10-CM | POA: Insufficient documentation

## 2013-10-11 DIAGNOSIS — M25461 Effusion, right knee: Secondary | ICD-10-CM | POA: Insufficient documentation

## 2013-10-11 DIAGNOSIS — E785 Hyperlipidemia, unspecified: Secondary | ICD-10-CM

## 2013-10-11 DIAGNOSIS — E119 Type 2 diabetes mellitus without complications: Secondary | ICD-10-CM

## 2013-10-11 DIAGNOSIS — M25469 Effusion, unspecified knee: Secondary | ICD-10-CM

## 2013-10-11 DIAGNOSIS — R635 Abnormal weight gain: Secondary | ICD-10-CM

## 2013-10-11 NOTE — Progress Notes (Signed)
Subjective:    Patient ID: Brian Casey, male    DOB: 1951-03-04, 63 y.o.   MRN: [id number]  HPI  63 yo very pleasant male here to discuss several issues.    ?protruding belly button- noticed it a few months ago.  Never red or painful.  Does not remember straining or lifting anything unusually heavy.  ?right knee swelling- behind right knee- feels like a "lemon."  Not painful but very tight.  Noticed it when he was on a beach trip for [place] 4th.  No redness or warmth.  No problems with his gait.  Hypothyroidism- On synthroid 100 mcg. Denies any symptoms of hypo or hyperthyroidism. Lab Results  Component Value Date   TSH 1.47 04/06/2013    HLD- not quite at goal for a diabetic. On Zocor 40 mg daily- started taking again in May but not daily. Denies any myalgias. Lab Results  Component Value Date   CHOL 202* 10/04/2013   HDL 42.10 10/04/2013   LDLCALC 133* 10/04/2013   LDLDIRECT 140.6 02/10/2012   TRIG 136.0 10/04/2013   CHOLHDL 5 10/04/2013   He is concerned about his weight. Wt Readings from Last 3 Encounters:  10/11/13 208 lb 8 oz (94.575 kg)  08/18/13 206 lb 8 oz (93.668 kg)  06/09/13 211 lb (95.709 kg)    DM- improved on Metformin 500 mg daily.  Denies any episodes of hypoglycemia. Does not check his CBGs. Lab Results  Component Value Date   HGBA1C 6.3 10/04/2013   Urine micro neg 03/2013.   Patient Active Problem List   Diagnosis Date Noted  . Weight gain 10/11/2013  . Puncture wound 08/18/2013  . Left low back pain 04/13/2013  . HLD (hyperlipidemia) 01/26/2011  . ANXIETY 12/26/2008  . HYPOTHYROIDISM 03/02/2007  . DIABETES MELLITUS, TYPE II 03/02/2007  . NICOTINE ADDICTION 03/02/2007  . DEPRESSION/ ANGER CONTROL 03/02/2007   Past Medical History  Diagnosis Date  . Depression   . GERD (gastroesophageal reflux disease)   . Diabetes mellitus   . Thyroid disease    Past Surgical History  Procedure Laterality Date  . Hemmorhoidectomy  1982   History   Substance Use Topics  . Smoking status: Current Some Day Smoker  . Smokeless tobacco: Never Used  . Alcohol Use: 8.4 oz/week    7 Shots of liquor, 7 Cans of beer per week   Family History  Problem Relation Age of Onset  . Cancer Mother     colon  . Hypertension Mother   . Diabetes Mother   . Depression Mother   . Colon cancer Mother   . Hyperthyroidism Father   . Hypertension Father   . Cancer Father     prostate  . Diabetes Maternal Grandmother   . Cancer Maternal Grandmother     lung, non smoker   Allergies  Allergen Reactions  . Bupropion Hcl     REACTION: Severe constipation, insomnia  . Penicillins     REACTION: Vomiting   Current Outpatient Prescriptions on File Prior to Visit  Medication Sig Dispense Refill  . ALPRAZolam (XANAX) 0.25 MG tablet Take 0.25 mg by mouth 2 (two) times daily as needed.        . clobetasol ointment (TEMOVATE) 8.29 % Apply 1 application topically 2 (two) times daily.  30 g  1  . levothyroxine (SYNTHROID, LEVOTHROID) 100 MCG tablet TAKE 1 TABLET BY MOUTH EVERY DAY  30 tablet  3  . metFORMIN (GLUCOPHAGE) 500 MG tablet TAKE 1 TABLET BY  MOUTH DAILY WITH BREAKFAST.  30 tablet  3  . omeprazole (PRILOSEC) 20 MG capsule TAKE ONE CAPSULE BY MOUTH EVERY DAY  30 capsule  3  . simvastatin (ZOCOR) 40 MG tablet Take 1 tablet (40 mg total) by mouth at bedtime.  30 tablet  11   No current facility-administered medications on file prior to visit.   The PMH, PSH, Social History, Family History, Medications, and allergies have been reviewed in [facility], and have been updated if relevant.   Review of Systems  See HPI     Objective:   Physical Exam BP 148/84  Pulse 70  Temp(Src) 98 F (36.7 C) (Oral)  Wt 208 lb 8 oz (94.575 kg)  SpO2 97% General:  overweght male in NAD Eyes:  PERRL Ears:  External ear exam shows no significant lesions or deformities.  Otoscopic examination reveals clear canals, tympanic membranes are intact bilaterally without  bulging, retraction, inflammation or discharge. Hearing is grossly normal bilaterally. Nose:  External nasal examination shows no deformity or inflammation. Nasal mucosa are pink and moist without lesions or exudates. Mouth:  Oral mucosa and oropharynx without lesions or exudates.  Teeth in good repair. Neck:  no carotid bruit or thyromegaly no cervical or supraclavicular lymphadenopathy  Lungs:  Normal respiratory effort, chest expands symmetrically. Lungs are clear to auscultation, no crackles or wheezes. Heart:  Normal rate and regular rhythm. S1 and S2 normal without gallop, murmur, click, rub or other extra sounds. Abdomen:  Bowel sounds positive,abdomen soft and non-tender + abdominal wall hernia above umbilicus, reducible MSK:  Large protrusion behind right knee, NTTP   Assessment & Plan:

## 2013-10-11 NOTE — Assessment & Plan Note (Signed)
Discussed his diet.  He is eating out a lot.  I suggested diabetic nutritionist. He will think about it.

## 2013-10-11 NOTE — Assessment & Plan Note (Signed)
?  Baker's cyst. Xray today.

## 2013-10-11 NOTE — Assessment & Plan Note (Signed)
Not at goal for a diabetic. Advised taking his statin daily, work on diet- discussed changes and I advised nutritionist referral.

## 2013-10-11 NOTE — Progress Notes (Signed)
Pre visit review using our clinic review tool, if applicable. No additional management support is needed unless otherwise documented below in the visit note. 

## 2013-10-11 NOTE — Patient Instructions (Addendum)
Good to see you. Please talk to your wife about a diabetic nutritionist- I would be happy to refer you.  Please stop by to see Rosaria Ferries on your way to set up your referral.  I will call you with your xray results.  When you call back, as to speak with [facility].

## 2013-10-11 NOTE — Assessment & Plan Note (Signed)
Improved

## 2013-10-11 NOTE — Assessment & Plan Note (Signed)
Refer to surgeon.

## 2013-10-12 ENCOUNTER — Telehealth: Payer: Self-pay | Admitting: *Deleted

## 2013-10-12 DIAGNOSIS — E119 Type 2 diabetes mellitus without complications: Secondary | ICD-10-CM

## 2013-10-12 NOTE — Telephone Encounter (Signed)
Referral placed.

## 2013-10-12 NOTE — Telephone Encounter (Signed)
Spoke to pt who states that he has spoken with his wife and is wanting referral to nutritionist. Pt is requesting an appt after 1545, if possible

## 2013-10-17 ENCOUNTER — Encounter: Payer: Self-pay | Admitting: Family Medicine

## 2013-10-17 ENCOUNTER — Ambulatory Visit (INDEPENDENT_AMBULATORY_CARE_PROVIDER_SITE_OTHER): Payer: BLUE CROSS/BLUE SHIELD | Admitting: Family Medicine

## 2013-10-17 VITALS — BP 150/76 | HR 66 | Temp 98.1°F | Ht 67.0 in | Wt 207.5 lb

## 2013-10-17 DIAGNOSIS — R609 Edema, unspecified: Secondary | ICD-10-CM

## 2013-10-17 DIAGNOSIS — M1710 Unilateral primary osteoarthritis, unspecified knee: Secondary | ICD-10-CM

## 2013-10-17 DIAGNOSIS — M1711 Unilateral primary osteoarthritis, right knee: Secondary | ICD-10-CM

## 2013-10-17 DIAGNOSIS — I10 Essential (primary) hypertension: Secondary | ICD-10-CM

## 2013-10-17 HISTORY — DX: Essential (primary) hypertension: I10

## 2013-10-17 MED ORDER — HYDROCHLOROTHIAZIDE 12.5 MG PO TABS: 12.5000 mg | ORAL_TABLET | Freq: Every day | ORAL | Status: DC

## 2013-10-17 NOTE — Progress Notes (Signed)
Pre visit review using our clinic review tool, if applicable. No additional management support is needed unless otherwise documented below in the visit note.

## 2013-10-17 NOTE — Patient Instructions (Signed)
Alleve 2 tabs by mouth two times a day over the counter: Take at least for 2 - 3 weeks. This is equal to a prescripton strength dose (GENERIC CHEAPER EQUIVALENT IS NAPROXEN SODIUM)   KNEE SLEEVE FOR WHEN YOU ARE STANDING FOR A LONG TIME.

## 2013-10-17 NOTE — Progress Notes (Signed)
[place] [address] Phone: [phone] Fax: [phone]  Patient ID: Brian Casey MRN: [id number], DOB: 07-09-1950, 63 y.o. Date of Encounter: 10/17/2013  Primary Physician:  Arnette Norris, MD   Chief Complaint: Knee Pain   Subjective:   History of Present Illness:  Brian Casey is a 63 y.o. very pleasant male patient who presents with the following:  63 yo male, knee pain. Will get to where it will ache and will tighten up. Will take some naproxen. He has a Child psychotherapist cyst on the posterior aspect of his right knee, he has a mild effusion intermittently. He has not had any locking up of the joint, and he is not having any symptomatic giving way. He occasionally does have some pain with deep flexion.  Concerned about his foot being swollen. LE on the R is swollen. On further questioning, the patient has had some intermittent bilateral lower extremity edema ongoing for very long time. It is slightly worse right now.  New onset hypertension. The patient it appears his had some elevated blood pressures for some time. He is a new-onset diabetic. He is currently on metformin, 1 tablet daily.  BP Readings from Last 3 Encounters:  10/17/13 150/76  10/11/13 148/84  08/18/13 150/92      Past Medical History, Surgical History, Social History, Family History, Problem List, Medications, and Allergies have been reviewed and updated if relevant.  Review of Systems:  GEN: No fevers, chills. Nontoxic. Primarily MSK c/o today. MSK: Detailed in the HPI GI: tolerating PO intake without difficulty Neuro: No numbness, parasthesias, or tingling associated. Otherwise the pertinent positives of the ROS are noted above.   Objective:   Physical Examination: BP 150/76  Pulse 66  Temp(Src) 98.1 F (36.7 C) (Oral)  Ht _0  (1.702 m)  Wt 207 lb 8 oz (94.121 kg)  BMI 32.49 kg/m2   GEN: WDWN, NAD, Non-toxic, Alert & Oriented x 3 HEENT: Atraumatic, Normocephalic.  Ears and  Nose: No external deformity. EXTR: 1+ LE edema on the L and 2 + LE edema on the R  NEURO: Normal gait.  PSYCH: Normally interactive. Conversant. Not depressed or anxious appearing.  Calm demeanor.   Knee:  R Gait: Normal heel toe pattern ROM: 0-130 Effusion: neg Small baker's cyst Echymosis or edema: none Patellar tendon NT Painful PLICA: neg Patellar grind: negative Medial and lateral patellar facet loading: negative medial and lateral joint lines:NT Mcmurray's neg Flexion-pinch neg Varus and valgus stress: stable Lachman: neg Ant and Post drawer: neg Hip abduction, IR, ER: WNL Hip flexion str: 5/5 Hip abd: 5/5 Quad: 5/5 VMO atrophy:No Hamstring concentric and eccentric: 5/5   Radiology: Dg Knee Complete 4 Views Right  10/11/2013   CLINICAL DATA:  Right knee swelling  EXAM: RIGHT KNEE - COMPLETE 4+ VIEW  COMPARISON:  None.  FINDINGS: Five views of the right knee submitted. No acute fracture or subluxation. No radiopaque foreign body. Mild narrowing of medial joint compartment. Small joint effusion.  IMPRESSION: No acute fracture or subluxation. Mild narrowing of medial joint compartment. Small joint effusion.   Electronically Signed   By: Lahoma Crocker M.D.   On: 10/11/2013 15:39    Assessment & Plan:   Primary osteoarthritis of right knee  Dependent edema  Hypertension  The patient's knee exam is essentially benign. I reassured him. He has a small Baker's cyst. He also has some mild arthritis. It is basically not inflamed at all right now. I recommended that  he take some Aleve when it is bothering him. He can also take and use a basic neoprene sleeve when he is up on his knee and up on his feet most of the time.  His renal function and liver function are recently normal. This is likely vascular insufficiency. Given that the patient is hypertensive, I am going to start him on a low-dose diuretic. I suspect that this will help some with his edema. At followup, if he continues  to be hypertensive, consideration of adding an ACE inhibitor in a diabetic would be appropriate.  I appreciate the opportunity to evaluate this very friendly patient. If you have any question regarding her care or prognosis, do not hesitate to ask.   New Prescriptions   HYDROCHLOROTHIAZIDE (HYDRODIURIL) 12.5 MG TABLET    Take 1 tablet (12.5 mg total) by mouth daily.    Signed,  Maud Deed. Treana Lacour, MD, CAQ Sports Medicine   Discontinued Medications   No medications on file   Current Medications at Discharge:   Medication List       This list is accurate as of: 10/17/13  5:14 PM.  Always use your most recent med list.               ALPRAZolam 0.25 MG tablet  Commonly known as:  XANAX  Take 0.25 mg by mouth 2 (two) times daily as needed.     clobetasol ointment 0.05 %  Commonly known as:  TEMOVATE  Apply 1 application topically 2 (two) times daily.     hydrochlorothiazide 12.5 MG tablet  Commonly known as:  HYDRODIURIL  Take 1 tablet (12.5 mg total) by mouth daily.     levothyroxine 100 MCG tablet  Commonly known as:  SYNTHROID, LEVOTHROID  TAKE 1 TABLET BY MOUTH EVERY DAY     metFORMIN 500 MG tablet  Commonly known as:  GLUCOPHAGE  TAKE 1 TABLET BY MOUTH DAILY WITH BREAKFAST.     omeprazole 20 MG capsule  Commonly known as:  PRILOSEC  TAKE ONE CAPSULE BY MOUTH EVERY DAY     simvastatin 40 MG tablet  Commonly known as:  ZOCOR  Take 1 tablet (40 mg total) by mouth at bedtime.

## 2013-10-30 ENCOUNTER — Ambulatory Visit (INDEPENDENT_AMBULATORY_CARE_PROVIDER_SITE_OTHER): Payer: Self-pay | Admitting: General Surgery

## 2013-10-31 ENCOUNTER — Encounter (INDEPENDENT_AMBULATORY_CARE_PROVIDER_SITE_OTHER): Payer: Self-pay | Admitting: Surgery

## 2013-10-31 ENCOUNTER — Ambulatory Visit (INDEPENDENT_AMBULATORY_CARE_PROVIDER_SITE_OTHER): Payer: BC Managed Care – PPO | Admitting: Surgery

## 2013-10-31 VITALS — BP 170/80 | HR 74 | Temp 98.6°F | Resp 18 | Ht 68.0 in | Wt 207.0 lb

## 2013-10-31 DIAGNOSIS — M6208 Separation of muscle (nontraumatic), other site: Secondary | ICD-10-CM

## 2013-10-31 DIAGNOSIS — K429 Umbilical hernia without obstruction or gangrene: Secondary | ICD-10-CM | POA: Insufficient documentation

## 2013-10-31 DIAGNOSIS — M6200 Separation of muscle (nontraumatic), unspecified site: Secondary | ICD-10-CM

## 2013-10-31 NOTE — Progress Notes (Signed)
Patient ID: Brian Casey, male   DOB: [birth year], 64 y.o.   MRN: [id number]  Chief Complaint  Patient presents with  . New Evaluation    eval umbilical hernia    HPI Brian Casey is a 63 y.o. male.  Referred by Dr. Arnette Norris for umbilical hernia  HPI This is a 63 year old male who presents with about 8 months of a protruding umbilicus. This remains partially reducible. He describes a burning sensation that radiates up from his umbilicus. This tends to be worse when he is up on his feet. He has also noticed a bulge in his upper midline when he is trying to sit up. This does not cause any significant discomfort. He was evaluated by his primary care physician who has now referred him for surgical evaluation. Past Medical History  Diagnosis Date  . Depression   . GERD (gastroesophageal reflux disease)   . Diabetes mellitus   . Thyroid disease   . Hypertension 10/17/2013    Past Surgical History  Procedure Laterality Date  . Hemmorhoidectomy  1982    Family History  Problem Relation Age of Onset  . Cancer Mother     colon  . Hypertension Mother   . Diabetes Mother   . Depression Mother   . Colon cancer Mother   . Hyperthyroidism Father   . Hypertension Father   . Cancer Father     prostate  . Diabetes Maternal Grandmother   . Cancer Maternal Grandmother     lung, non smoker    Social History History  Substance Use Topics  . Smoking status: Current Some Day Smoker  . Smokeless tobacco: Never Used  . Alcohol Use: 8.4 oz/week    7 Shots of liquor, 7 Cans of beer per week    Allergies  Allergen Reactions  . Bupropion Hcl     REACTION: Severe constipation, insomnia  . Penicillins     REACTION: Vomiting    Current Outpatient Prescriptions  Medication Sig Dispense Refill  . ALPRAZolam (XANAX) 0.25 MG tablet Take 0.25 mg by mouth 2 (two) times daily as needed.        . clobetasol ointment (TEMOVATE) 4.01 % Apply 1 application topically 2 (two) times daily.  30 g   1  . hydrochlorothiazide (HYDRODIURIL) 12.5 MG tablet Take 1 tablet (12.5 mg total) by mouth daily.  30 tablet  5  . levothyroxine (SYNTHROID, LEVOTHROID) 100 MCG tablet TAKE 1 TABLET BY MOUTH EVERY DAY  30 tablet  3  . metFORMIN (GLUCOPHAGE) 500 MG tablet TAKE 1 TABLET BY MOUTH DAILY WITH BREAKFAST.  30 tablet  3  . omeprazole (PRILOSEC) 20 MG capsule TAKE ONE CAPSULE BY MOUTH EVERY DAY  30 capsule  3  . simvastatin (ZOCOR) 40 MG tablet Take 1 tablet (40 mg total) by mouth at bedtime.  30 tablet  11   No current facility-administered medications for this visit.    Review of Systems Review of Systems  Constitutional: Negative for fever, chills and unexpected weight change.  HENT: Negative for congestion, hearing loss, sore throat, trouble swallowing and voice change.   Eyes: Negative for visual disturbance.  Respiratory: Negative for cough and wheezing.   Cardiovascular: Negative for chest pain, palpitations and leg swelling.  Gastrointestinal: Positive for abdominal distention. Negative for nausea, vomiting, abdominal pain, diarrhea, constipation, blood in stool, anal bleeding and rectal pain.  Genitourinary: Negative for hematuria and difficulty urinating.  Musculoskeletal: Negative for arthralgias.  Skin: Negative for rash and wound.  Neurological: Negative for seizures, syncope, weakness and headaches.  Hematological: Negative for adenopathy. Does not bruise/bleed easily.  Psychiatric/Behavioral: Negative for confusion.    Blood pressure 170/80, pulse 74, temperature 98.6 F (37 C), resp. rate 18, height 5' 8" (1.727 m), weight 207 lb (93.895 kg).  Physical Exam Physical Exam WDWN in NAD HEENT:  EOMI, sclera anicteric Neck:  No masses, no thyromegaly Lungs:  CTA bilaterally; normal respiratory effort CV:  Regular rate and rhythm; no murmurs Abd:  +bowel sounds, soft, small bulge at upper umbilicus - easily reducible; 1 cm hernia defect The patient also has an upper midline  rectus diastasis when contracting his abdominal muscles Ext:  Well-perfused; no edema Skin:  Warm, dry; no sign of jaundice  Data Reviewed none  Assessment    Umbilical hernia Rectus diastasis     Plan    Umbilical hernia repair - possibly with mesh No need for intervention for rectus diastasis.  The surgical procedure has been discussed with the patient.  Potential risks, benefits, alternative treatments, and expected outcomes have been explained.  All of the patient's questions at this time have been answered.  The likelihood of reaching the patient's treatment goal is good.  The patient understand the proposed surgical procedure and wishes to proceed.         Herndon Grill K. 10/31/2013, 4:48 PM

## 2013-11-09 NOTE — Pre-Procedure Instructions (Addendum)
DONTELL MIAN  11/09/2013   Your procedure is scheduled on: Thursday, August 27.  Report to [facility] Admitting at 7:30AM.  Call this number if you have problems the morning of surgery: [phone]   Remember:   Do not eat food or drink liquids after midnight Wednesday, August 26.   Take these medicines the morning of surgery with A SIP OF WATER: levothyroxine (SYNTHROID, LEVOTHROID), omeprazole (PRILOSEC), Xanax (if needed)                Stop taking Aspirin, Coumadin, Plavix, Effient and Herbal medications.  Do not take any NSAIDs ie: Ibuprofen,  Advil,Naproxen or any medication containing Aspirin.   Do not wear jewelry, make-up or nail polish.  Do not wear lotions, powders, or perfumes.  Men may shave face and neck.  Do not bring valuables to the hospital.              [facility] is not responsible for any belongings or valuables.               Contacts, dentures or bridgework may not be worn into surgery.  Leave suitcase in the car. After surgery it may be brought to your room.  For patients admitted to the hospital, discharge time is determined by your  treatment team.               Patients discharged the day of surgery will not be allowed to drive home.  Name and phone number of your driver: Family/ Friend   Special Instructions: Review  [facility] - Preparing For Surgery.   Please read over the following fact sheets that you were given: Pain Booklet, Coughing and Deep Breathing and Surgical Site Infection Prevention

## 2013-11-10 ENCOUNTER — Encounter (HOSPITAL_COMMUNITY)
Admission: RE | Admit: 2013-11-10 | Discharge: 2013-11-10 | Disposition: A | Discharge status: Home / Self Care | Payer: BC Managed Care – PPO | Source: Ambulatory Visit | Attending: Surgery | Admitting: Surgery

## 2013-11-10 ENCOUNTER — Encounter (HOSPITAL_COMMUNITY): Payer: Self-pay

## 2013-11-10 ENCOUNTER — Encounter (HOSPITAL_COMMUNITY)
Admission: RE | Admit: 2013-11-10 | Discharge: 2013-11-10 | Disposition: A | Discharge status: Home / Self Care | Payer: BC Managed Care – PPO | Source: Ambulatory Visit | Attending: Anesthesiology | Admitting: Anesthesiology

## 2013-11-10 DIAGNOSIS — Z01818 Encounter for other preprocedural examination: Secondary | ICD-10-CM | POA: Diagnosis not present

## 2013-11-10 DIAGNOSIS — K429 Umbilical hernia without obstruction or gangrene: Secondary | ICD-10-CM | POA: Insufficient documentation

## 2013-11-10 HISTORY — DX: Hypothyroidism, unspecified: E03.9

## 2013-11-10 HISTORY — DX: Type 2 diabetes mellitus without complications: E11.9

## 2013-11-10 HISTORY — DX: Pure hypercholesterolemia, unspecified: E78.00

## 2013-11-10 LAB — CBC
HCT: 42.9 % (ref 39.0–52.0)
Hemoglobin: 15.4 g/dL (ref 13.0–17.0)
MCH: 35.2 pg — ABNORMAL HIGH (ref 26.0–34.0)
MCHC: 35.9 g/dL (ref 30.0–36.0)
MCV: 97.9 fL (ref 78.0–100.0)
PLATELETS: 200 10*3/uL (ref 150–400)
RBC: 4.38 MIL/uL (ref 4.22–5.81)
RDW: 12.2 % (ref 11.5–15.5)
WBC: 5.4 10*3/uL (ref 4.0–10.5)

## 2013-11-10 LAB — COMPREHENSIVE METABOLIC PANEL
ALT: 40 U/L (ref 0–53)
ANION GAP: 11 (ref 5–15)
AST: 34 U/L (ref 0–37)
Albumin: 3.9 g/dL (ref 3.5–5.2)
Alkaline Phosphatase: 56 U/L (ref 39–117)
BUN: 9 mg/dL (ref 6–23)
CALCIUM: 9.3 mg/dL (ref 8.4–10.5)
CO2: 26 mEq/L (ref 19–32)
CREATININE: 1 mg/dL (ref 0.50–1.35)
Chloride: 98 mEq/L (ref 96–112)
GFR, EST NON AFRICAN AMERICAN: 79 mL/min — AB (ref >90)
GLUCOSE: 171 mg/dL — AB (ref 70–99)
Potassium: 4.6 mEq/L (ref 3.7–5.3)
Sodium: 135 mEq/L — ABNORMAL LOW (ref 137–147)
TOTAL PROTEIN: 6.9 g/dL (ref 6.0–8.3)
Total Bilirubin: 0.7 mg/dL (ref 0.3–1.2)

## 2013-11-10 NOTE — Progress Notes (Signed)
11/10/13 0834  OBSTRUCTIVE SLEEP APNEA  Have you ever been diagnosed with sleep apnea through a sleep study? No  Do you snore loudly (loud enough to be heard through closed doors)?  0  Do you often feel tired, fatigued, or sleepy during the daytime? 0  Has anyone observed you stop breathing during your sleep? 0  Do you have, or are you being treated for high blood pressure? 1  BMI more than 35 kg/m2? 0  Age over 61 years old? 1  Neck circumference greater than 40 cm/16 inches? 1  Gender: 1  Obstructive Sleep Apnea Score 4  Score 4 or greater  Results sent to PCP

## 2013-11-10 NOTE — Progress Notes (Signed)
Anesthesia Chart Review:  Pt is 63 year old male posted for umbilical hernia repair 07/16/50 with Dr. Georgette Dover.   PMH: HTN, diabetes, hypothyroid, hyperlipidemia, GERD.   Medications include: Hctz, metformin, prilosec, synthroid, zocor.   Chest x-ray reviewed.  Preoperative labs reviewed.   EKG: NSR with 1st degree AV block; cannot rule out anterior infarct, age undetermined. No previous available for comparison.   Reviewed with Dr. Tamala Julian.   If no changes, I anticipate pt can proceed with surgery as scheduled.    Willeen Cass, FNP-BC [facility] Short Stay Surgical Center/Anesthesiology Phone: [phone] 11/10/2013 4:16 PM

## 2013-11-11 ENCOUNTER — Other Ambulatory Visit: Payer: Self-pay | Admitting: Family Medicine

## 2013-11-15 MED ORDER — CEFAZOLIN SODIUM-DEXTROSE 2-3 GM-% IV SOLR
2.0000 g | INTRAVENOUS | Status: AC
  Administered 2013-11-16: 2 g via INTRAVENOUS
  Filled 2013-11-15: qty 50

## 2013-11-16 ENCOUNTER — Encounter (HOSPITAL_COMMUNITY): Payer: Self-pay | Admitting: *Deleted

## 2013-11-16 ENCOUNTER — Ambulatory Visit (HOSPITAL_COMMUNITY)
Admission: RE | Admit: 2013-11-16 | Discharge: 2013-11-16 | Disposition: A | Discharge status: Home / Self Care | Payer: BC Managed Care – PPO | Source: Ambulatory Visit | Attending: Surgery | Admitting: Surgery

## 2013-11-16 ENCOUNTER — Encounter (HOSPITAL_COMMUNITY)
Admission: RE | Disposition: A | Discharge status: Home / Self Care | Payer: Self-pay | Source: Ambulatory Visit | Attending: Surgery

## 2013-11-16 ENCOUNTER — Encounter (HOSPITAL_COMMUNITY): Payer: BC Managed Care – PPO | Admitting: Emergency Medicine

## 2013-11-16 ENCOUNTER — Ambulatory Visit (HOSPITAL_COMMUNITY): Payer: BC Managed Care – PPO | Admitting: Anesthesiology

## 2013-11-16 DIAGNOSIS — K429 Umbilical hernia without obstruction or gangrene: Secondary | ICD-10-CM

## 2013-11-16 DIAGNOSIS — K219 Gastro-esophageal reflux disease without esophagitis: Secondary | ICD-10-CM | POA: Diagnosis not present

## 2013-11-16 DIAGNOSIS — E039 Hypothyroidism, unspecified: Secondary | ICD-10-CM | POA: Diagnosis not present

## 2013-11-16 DIAGNOSIS — E119 Type 2 diabetes mellitus without complications: Secondary | ICD-10-CM | POA: Insufficient documentation

## 2013-11-16 DIAGNOSIS — F17200 Nicotine dependence, unspecified, uncomplicated: Secondary | ICD-10-CM | POA: Diagnosis not present

## 2013-11-16 DIAGNOSIS — I10 Essential (primary) hypertension: Secondary | ICD-10-CM | POA: Insufficient documentation

## 2013-11-16 DIAGNOSIS — F341 Dysthymic disorder: Secondary | ICD-10-CM | POA: Diagnosis not present

## 2013-11-16 HISTORY — PX: INSERTION OF MESH: SHX5868

## 2013-11-16 HISTORY — PX: UMBILICAL HERNIA REPAIR: SHX196

## 2013-11-16 LAB — GLUCOSE, CAPILLARY
Glucose-Capillary: 153 mg/dL — ABNORMAL HIGH (ref 70–99)
Glucose-Capillary: 151 mg/dL — ABNORMAL HIGH (ref 70–99)

## 2013-11-16 SURGERY — REPAIR, HERNIA, UMBILICAL, ADULT
Anesthesia: General | Site: Abdomen

## 2013-11-16 MED ORDER — LIDOCAINE HCL (CARDIAC) 20 MG/ML IV SOLN
INTRAVENOUS | Status: DC | PRN
  Administered 2013-11-16: 50 mg via INTRAVENOUS

## 2013-11-16 MED ORDER — FENTANYL CITRATE 0.05 MG/ML IJ SOLN
INTRAMUSCULAR | Status: DC | PRN
  Administered 2013-11-16: 100 ug via INTRAVENOUS

## 2013-11-16 MED ORDER — MIDAZOLAM HCL 5 MG/5ML IJ SOLN
INTRAMUSCULAR | Status: DC | PRN
  Administered 2013-11-16: 1 mg via INTRAVENOUS

## 2013-11-16 MED ORDER — LACTATED RINGERS IV SOLN
INTRAVENOUS | Status: DC
  Administered 2013-11-16: 08:00:00 via INTRAVENOUS

## 2013-11-16 MED ORDER — GLYCOPYRROLATE 0.2 MG/ML IJ SOLN
INTRAMUSCULAR | Status: AC
  Filled 2013-11-16: qty 2

## 2013-11-16 MED ORDER — ROCURONIUM BROMIDE 50 MG/5ML IV SOLN
INTRAVENOUS | Status: AC
  Filled 2013-11-16: qty 1

## 2013-11-16 MED ORDER — ONDANSETRON HCL 4 MG/2ML IJ SOLN
INTRAMUSCULAR | Status: AC
  Filled 2013-11-16: qty 2

## 2013-11-16 MED ORDER — OXYCODONE-ACETAMINOPHEN 5-325 MG PO TABS: 1.0000 | ORAL_TABLET | ORAL | Status: DC | PRN

## 2013-11-16 MED ORDER — ONDANSETRON HCL 4 MG/2ML IJ SOLN
INTRAMUSCULAR | Status: DC | PRN
  Administered 2013-11-16: 4 mg via INTRAVENOUS

## 2013-11-16 MED ORDER — PROPOFOL 10 MG/ML IV BOLUS
INTRAVENOUS | Status: AC
  Filled 2013-11-16: qty 20

## 2013-11-16 MED ORDER — FENTANYL CITRATE 0.05 MG/ML IJ SOLN
INTRAMUSCULAR | Status: AC
  Filled 2013-11-16: qty 5

## 2013-11-16 MED ORDER — MORPHINE SULFATE 2 MG/ML IJ SOLN: 2.0000 mg | INTRAMUSCULAR | Status: DC | PRN

## 2013-11-16 MED ORDER — PROPOFOL 10 MG/ML IV BOLUS
INTRAVENOUS | Status: DC | PRN
  Administered 2013-11-16: 200 mg via INTRAVENOUS

## 2013-11-16 MED ORDER — ONDANSETRON HCL 4 MG/2ML IJ SOLN: 4.0000 mg | INTRAMUSCULAR | Status: DC | PRN

## 2013-11-16 MED ORDER — CHLORHEXIDINE GLUCONATE 4 % EX LIQD
1.0000 | Freq: Once | CUTANEOUS | Status: DC
Start: 2013-11-17 — End: 2013-11-16
  Filled 2013-11-16: qty 15

## 2013-11-16 MED ORDER — ARTIFICIAL TEARS OP OINT
TOPICAL_OINTMENT | OPHTHALMIC | Status: AC
  Filled 2013-11-16: qty 3.5

## 2013-11-16 MED ORDER — NEOSTIGMINE METHYLSULFATE 10 MG/10ML IV SOLN
INTRAVENOUS | Status: AC
  Filled 2013-11-16: qty 1

## 2013-11-16 MED ORDER — 0.9 % SODIUM CHLORIDE (POUR BTL) OPTIME
TOPICAL | Status: DC | PRN
  Administered 2013-11-16: 1000 mL

## 2013-11-16 MED ORDER — LACTATED RINGERS IV SOLN
INTRAVENOUS | Status: DC | PRN
  Administered 2013-11-16: 09:00:00 via INTRAVENOUS

## 2013-11-16 MED ORDER — BUPIVACAINE-EPINEPHRINE 0.5% -1:200000 IJ SOLN
INTRAMUSCULAR | Status: DC | PRN
  Administered 2013-11-16: 8 mL

## 2013-11-16 MED ORDER — MIDAZOLAM HCL 2 MG/2ML IJ SOLN
INTRAMUSCULAR | Status: AC
  Filled 2013-11-16: qty 2

## 2013-11-16 SURGICAL SUPPLY — 46 items
BENZOIN TINCTURE PRP APPL 2/3 (GAUZE/BANDAGES/DRESSINGS) ×3 IMPLANT
BLADE SURG ROTATE 9660 (MISCELLANEOUS) IMPLANT
CANISTER SUCTION 2500CC (MISCELLANEOUS) IMPLANT
CHLORAPREP W/TINT 26ML (MISCELLANEOUS) ×3 IMPLANT
CLOSURE WOUND 1/2 X4 (GAUZE/BANDAGES/DRESSINGS) ×1
COVER SURGICAL LIGHT HANDLE (MISCELLANEOUS) ×3 IMPLANT
DRAPE PED LAPAROTOMY (DRAPES) ×3 IMPLANT
DRAPE UTILITY 15X26 W/TAPE STR (DRAPE) ×6 IMPLANT
DRSG TEGADERM 4X4.75 (GAUZE/BANDAGES/DRESSINGS) ×3 IMPLANT
ELECT CAUTERY BLADE 6.4 (BLADE) ×3 IMPLANT
ELECT REM PT RETURN 9FT ADLT (ELECTROSURGICAL) ×3
ELECTRODE REM PT RTRN 9FT ADLT (ELECTROSURGICAL) ×1 IMPLANT
GAUZE SPONGE 4X4 12PLY STRL (GAUZE/BANDAGES/DRESSINGS) ×3 IMPLANT
GAUZE SPONGE 4X4 16PLY XRAY LF (GAUZE/BANDAGES/DRESSINGS) ×3 IMPLANT
GLOVE BIO SURGEON STRL SZ7 (GLOVE) ×6 IMPLANT
GLOVE BIOGEL PI IND STRL 7.0 (GLOVE) ×3 IMPLANT
GLOVE BIOGEL PI IND STRL 7.5 (GLOVE) ×1 IMPLANT
GLOVE BIOGEL PI INDICATOR 7.0 (GLOVE) ×6
GLOVE BIOGEL PI INDICATOR 7.5 (GLOVE) ×2
GLOVE SURG SS PI 7.0 STRL IVOR (GLOVE) ×3 IMPLANT
GOWN STRL REUS W/ TWL LRG LVL3 (GOWN DISPOSABLE) ×3 IMPLANT
GOWN STRL REUS W/TWL LRG LVL3 (GOWN DISPOSABLE) ×6
KIT BASIN OR (CUSTOM PROCEDURE TRAY) ×3 IMPLANT
KIT ROOM TURNOVER OR (KITS) ×3 IMPLANT
MESH VENTRALEX ST 1-7/10 CRC S (Mesh General) ×3 IMPLANT
NEEDLE HYPO 25GX1X1/2 BEV (NEEDLE) ×3 IMPLANT
NS IRRIG 1000ML POUR BTL (IV SOLUTION) ×3 IMPLANT
PACK SURGICAL SETUP 50X90 (CUSTOM PROCEDURE TRAY) ×3 IMPLANT
PAD ARMBOARD 7.5X6 YLW CONV (MISCELLANEOUS) ×6 IMPLANT
PENCIL BUTTON HOLSTER BLD 10FT (ELECTRODE) ×3 IMPLANT
SPONGE GAUZE 4X4 12PLY STER LF (GAUZE/BANDAGES/DRESSINGS) ×3 IMPLANT
SPONGE LAP 18X18 X RAY DECT (DISPOSABLE) IMPLANT
STRIP CLOSURE SKIN 1/2X4 (GAUZE/BANDAGES/DRESSINGS) ×2 IMPLANT
SUT MNCRL AB 4-0 PS2 18 (SUTURE) ×3 IMPLANT
SUT NOVA NAB DX-16 0-1 5-0 T12 (SUTURE) ×3 IMPLANT
SUT NOVA NAB GS-21 0 18 T12 DT (SUTURE) ×6 IMPLANT
SUT VIC AB 3-0 SH 27 (SUTURE) ×2
SUT VIC AB 3-0 SH 27X BRD (SUTURE) ×1 IMPLANT
SYR BULB 3OZ (MISCELLANEOUS) ×3 IMPLANT
SYR CONTROL 10ML LL (SYRINGE) ×3 IMPLANT
TOWEL OR 17X24 6PK STRL BLUE (TOWEL DISPOSABLE) IMPLANT
TOWEL OR 17X26 10 PK STRL BLUE (TOWEL DISPOSABLE) ×3 IMPLANT
TUBE CONNECTING 12'X1/4 (SUCTIONS)
TUBE CONNECTING 12X1/4 (SUCTIONS) IMPLANT
WATER STERILE IRR 1000ML POUR (IV SOLUTION) IMPLANT
YANKAUER SUCT BULB TIP NO VENT (SUCTIONS) IMPLANT

## 2013-11-16 NOTE — Anesthesia Postprocedure Evaluation (Signed)
Anesthesia Post Note  Patient: Brian Casey  Procedure(s) Performed: Procedure(s) (LRB): UMBILICAL HERNIA REPAIR (N/A) INSERTION OF MESH (N/A)  Anesthesia type: general  Patient location: PACU  Post pain: Pain level controlled  Post assessment: Patient's Cardiovascular Status Stable  Last Vitals:  Filed Vitals:   11/16/13 1045  BP:   Pulse: 60  Temp:   Resp: 18    Post vital signs: Reviewed and stable  Level of consciousness: sedated  Complications: No apparent anesthesia complications

## 2013-11-16 NOTE — Anesthesia Preprocedure Evaluation (Addendum)
Anesthesia Evaluation  Patient identified by MRN, date of birth, ID band Patient awake    Reviewed: Allergy & Precautions, H&P , NPO status , Patient's Chart, lab work & pertinent test results, reviewed documented beta blocker date and time   Airway Mallampati: I TM Distance: >3 FB Neck ROM: Full    Dental   Pulmonary Current Smoker,          Cardiovascular hypertension, Pt. on medications     Neuro/Psych Anxiety Depression    GI/Hepatic GERD-  Medicated and Controlled,  Endo/Other  diabetes, Type 2Hypothyroidism   Renal/GU      Musculoskeletal   Abdominal   Peds  Hematology   Anesthesia Other Findings   Reproductive/Obstetrics                          Anesthesia Physical Anesthesia Plan  ASA: III  Anesthesia Plan: General   Post-op Pain Management:    Induction: Intravenous  Airway Management Planned: LMA  Additional Equipment:   Intra-op Plan:   Post-operative Plan: Extubation in OR  Informed Consent: I have reviewed the patients History and Physical, chart, labs and discussed the procedure including the risks, benefits and alternatives for the proposed anesthesia with the patient or authorized representative who has indicated his/her understanding and acceptance.   Dental advisory given  Plan Discussed with: CRNA and Surgeon  Anesthesia Plan Comments:        Anesthesia Quick Evaluation

## 2013-11-16 NOTE — Interval H&P Note (Signed)
History and Physical Interval Note:  11/16/2013 8:46 AM  Brian Casey  has presented today for surgery, with the diagnosis of umbilical hernia  The various methods of treatment have been discussed with the patient and family. After consideration of risks, benefits and other options for treatment, the patient has consented to  Procedure(s): UMBILICAL HERNIA REPAIR (N/A) as a surgical intervention .  The patient's history has been reviewed, patient examined, no change in status, stable for surgery.  I have reviewed the patient's chart and labs.  Questions were answered to the patient's satisfaction.     Dave Mergen K.

## 2013-11-16 NOTE — Discharge Instructions (Signed)
[facility], [place]  UMBILICAL HERNIA REPAIR: POST OP INSTRUCTIONS  Always review your discharge instruction sheet given to you by the facility where your surgery was performed. IF YOU HAVE DISABILITY OR FAMILY LEAVE FORMS, YOU MUST BRING THEM TO THE OFFICE FOR PROCESSING.   DO NOT GIVE THEM TO YOUR DOCTOR.  1. A  prescription for pain medication may be given to you upon discharge.  Take your pain medication as prescribed, if needed.  If narcotic pain medicine is not needed, then you may take acetaminophen (Tylenol) or ibuprofen (Advil) as needed. 2. Take your usually prescribed medications unless otherwise directed. 3. If you need a refill on your pain medication, please contact your pharmacy.  They will contact our office to request authorization. Prescriptions will not be filled after 5 pm or on week-ends. 4. You should follow a light diet the first 24 hours after arrival home, such as soup and crackers, etc.  Be sure to include lots of fluids daily.  Resume your normal diet the day after surgery. 5. Most patients will experience some swelling and bruising around the umbilicus or in the groin and scrotum.  Ice packs and reclining will help.  Swelling and bruising can take several days to resolve.  6. It is common to experience some constipation if taking pain medication after surgery.  Increasing fluid intake and taking a stool softener (such as Colace) will usually help or prevent this problem from occurring.  A mild laxative (Milk of Magnesia or Miralax) should be taken according to package directions if there are no bowel movements after 48 hours. 7. Unless discharge instructions indicate otherwise, you may remove your bandages 24-48 hours after surgery, and you may shower at that time.  You will have steri-strips (small skin tapes) in place directly over the incision.  These strips should be left on the skin for 7-10 days. 8. ACTIVITIES:  You may resume regular (light) daily activities  beginning the next day--such as daily self-care, walking, climbing stairs--gradually increasing activities as tolerated.  You may have sexual intercourse when it is comfortable.  Refrain from any heavy lifting or straining until approved by your doctor. a. You may drive when you are no longer taking prescription pain medication, you can comfortably wear a seatbelt, and you can safely maneuver your car and apply brakes. b. RETURN TO WORK:  2-3 weeks with light duty - no lifting over 15 lbs. 9. You should see your doctor in the office for a follow-up appointment approximately 2-3 weeks after your surgery.  Make sure that you call for this appointment within a day or two after you arrive home to insure a convenient appointment time. 10. OTHER INSTRUCTIONS:  __________________________________________________________________________________________________________________________________________________________________________________________  WHEN TO CALL YOUR DOCTOR: 1. Fever over 101.0 2. Inability to urinate 3. Nausea and/or vomiting 4. Extreme swelling or bruising 5. Continued bleeding from incision. 6. Increased pain, redness, or drainage from the incision  The clinic staff is available to answer your questions during regular business hours.  Please dont hesitate to call and ask to speak to one of the nurses for clinical concerns.  If you have a medical emergency, go to the nearest emergency room or call 911.  A surgeon from [facility] is always on call at the hospital   [address], [place], [place], [place]  [address] ?  [address], [place], [place]   [address] [phone]    FAX [phone] Web site: [email or web address]

## 2013-11-16 NOTE — Progress Notes (Signed)
Pt states allergy to pcn, (causes nausea) Dr Georgette Dover called and informed.  States to use Ancef

## 2013-11-16 NOTE — Progress Notes (Signed)
Voided in BR x 1

## 2013-11-16 NOTE — H&P (View-Only) (Signed)
Patient ID: Brian Casey, male   DOB: [birth year], 64 y.o.   MRN: [id number]  Chief Complaint  Patient presents with  . New Evaluation    eval umbilical hernia    HPI Brian Casey is a 63 y.o. male.  Referred by Dr. Arnette Norris for umbilical hernia  HPI This is a 63 year old male who presents with about 8 months of a protruding umbilicus. This remains partially reducible. He describes a burning sensation that radiates up from his umbilicus. This tends to be worse when he is up on his feet. He has also noticed a bulge in his upper midline when he is trying to sit up. This does not cause any significant discomfort. He was evaluated by his primary care physician who has now referred him for surgical evaluation. Past Medical History  Diagnosis Date  . Depression   . GERD (gastroesophageal reflux disease)   . Diabetes mellitus   . Thyroid disease   . Hypertension 10/17/2013    Past Surgical History  Procedure Laterality Date  . Hemmorhoidectomy  1982    Family History  Problem Relation Age of Onset  . Cancer Mother     colon  . Hypertension Mother   . Diabetes Mother   . Depression Mother   . Colon cancer Mother   . Hyperthyroidism Father   . Hypertension Father   . Cancer Father     prostate  . Diabetes Maternal Grandmother   . Cancer Maternal Grandmother     lung, non smoker    Social History History  Substance Use Topics  . Smoking status: Current Some Day Smoker  . Smokeless tobacco: Never Used  . Alcohol Use: 8.4 oz/week    7 Shots of liquor, 7 Cans of beer per week    Allergies  Allergen Reactions  . Bupropion Hcl     REACTION: Severe constipation, insomnia  . Penicillins     REACTION: Vomiting    Current Outpatient Prescriptions  Medication Sig Dispense Refill  . ALPRAZolam (XANAX) 0.25 MG tablet Take 0.25 mg by mouth 2 (two) times daily as needed.        . clobetasol ointment (TEMOVATE) 4.01 % Apply 1 application topically 2 (two) times daily.  30 g   1  . hydrochlorothiazide (HYDRODIURIL) 12.5 MG tablet Take 1 tablet (12.5 mg total) by mouth daily.  30 tablet  5  . levothyroxine (SYNTHROID, LEVOTHROID) 100 MCG tablet TAKE 1 TABLET BY MOUTH EVERY DAY  30 tablet  3  . metFORMIN (GLUCOPHAGE) 500 MG tablet TAKE 1 TABLET BY MOUTH DAILY WITH BREAKFAST.  30 tablet  3  . omeprazole (PRILOSEC) 20 MG capsule TAKE ONE CAPSULE BY MOUTH EVERY DAY  30 capsule  3  . simvastatin (ZOCOR) 40 MG tablet Take 1 tablet (40 mg total) by mouth at bedtime.  30 tablet  11   No current facility-administered medications for this visit.    Review of Systems Review of Systems  Constitutional: Negative for fever, chills and unexpected weight change.  HENT: Negative for congestion, hearing loss, sore throat, trouble swallowing and voice change.   Eyes: Negative for visual disturbance.  Respiratory: Negative for cough and wheezing.   Cardiovascular: Negative for chest pain, palpitations and leg swelling.  Gastrointestinal: Positive for abdominal distention. Negative for nausea, vomiting, abdominal pain, diarrhea, constipation, blood in stool, anal bleeding and rectal pain.  Genitourinary: Negative for hematuria and difficulty urinating.  Musculoskeletal: Negative for arthralgias.  Skin: Negative for rash and wound.  Neurological: Negative for seizures, syncope, weakness and headaches.  Hematological: Negative for adenopathy. Does not bruise/bleed easily.  Psychiatric/Behavioral: Negative for confusion.    Blood pressure 170/80, pulse 74, temperature 98.6 F (37 C), resp. rate 18, height 5' 8" (1.727 m), weight 207 lb (93.895 kg).  Physical Exam Physical Exam WDWN in NAD HEENT:  EOMI, sclera anicteric Neck:  No masses, no thyromegaly Lungs:  CTA bilaterally; normal respiratory effort CV:  Regular rate and rhythm; no murmurs Abd:  +bowel sounds, soft, small bulge at upper umbilicus - easily reducible; 1 cm hernia defect The patient also has an upper midline  rectus diastasis when contracting his abdominal muscles Ext:  Well-perfused; no edema Skin:  Warm, dry; no sign of jaundice  Data Reviewed none  Assessment    Umbilical hernia Rectus diastasis     Plan    Umbilical hernia repair - possibly with mesh No need for intervention for rectus diastasis.  The surgical procedure has been discussed with the patient.  Potential risks, benefits, alternative treatments, and expected outcomes have been explained.  All of the patient's questions at this time have been answered.  The likelihood of reaching the patient's treatment goal is good.  The patient understand the proposed surgical procedure and wishes to proceed.         Denai Caba K. 10/31/2013, 4:48 PM

## 2013-11-16 NOTE — Op Note (Signed)
Indications:  The patient presented with a history of a small reducible umbilical hernia.  The patient was examined and we recommended umbilical hernia repair with mesh.  Pre-operative diagnosis:  Umbilical hernia  Post-operative diagnosis:  Same  Surgeon: Shayleen Eppinger K.   Assistants: none  Anesthesia: General LMA anesthesia  ASA Class: 1   Procedure Details  The patient was seen again in the Holding Room. The risks, benefits, complications, treatment options, and expected outcomes were discussed with the patient. The possibilities of reaction to medication, pulmonary aspiration, perforation of viscus, bleeding, recurrent infection, the need for additional procedures, and development of a complication requiring transfusion or further operation were discussed with the patient and/or family. There was concurrence with the proposed plan, and informed consent was obtained. The site of surgery was properly noted/marked. The patient was taken to the Operating Room, identified as Brian Casey, and the procedure verified as umbilical hernia repair. A Time Out was held and the above information confirmed.  After an adequate level of general anesthesia was obtained, the patient's abdomen was prepped with Chloraprep and draped in sterile fashion.  We made a transverse incision above the umbilicus.  Dissection was carried down to the hernia sac with cautery.  We dissected bluntly around the hernia sac down to the edge of the fascial defect.  We reduced the hernia sac back into the pre-peritoneal space.  The fascial defect measured 1 cm.  We cleared the fascia in all directions.  A small Ventralex mesh was inserted into the pre-peritoneal space and was deployed.  The mesh was secured with four trans-fascial sutures of 0 Novofil.  The fascial defect was closed with multiple interrupted figure-of-eight 1 Novofil sutures.  The base of the umbilicus was tacked down with 3-0 Vicryl.  3-0 Vicryl was used to  close the subcutaneous tissues and 4-0 Monocryl was used to close the skin.  Steri-strips and clean dressing were applied.  The patient was extubated and brought to the recovery room in stable condition.  All sponge, instrument, and needle counts were correct prior to closure and at the conclusion of the case.   Estimated Blood Loss: Minimal          Complications: None; patient tolerated the procedure well.         Disposition: PACU - hemodynamically stable.         Condition: stable  Imogene Burn. Georgette Dover, MD, [facility]  General/ Trauma Surgery  11/16/2013 10:12 AM

## 2013-11-16 NOTE — Transfer of Care (Signed)
Immediate Anesthesia Transfer of Care Note  Patient: Brian Casey  Procedure(s) Performed: Procedure(s): UMBILICAL HERNIA REPAIR (N/A) INSERTION OF MESH (N/A)  Patient Location: PACU  Anesthesia Type:General  Level of Consciousness: awake, alert  and oriented  Airway & Oxygen Therapy: Patient Spontanous Breathing and Patient connected to face mask oxygen  Post-op Assessment: Report given to PACU RN  Post vital signs: Reviewed and stable  Complications: No apparent anesthesia complications

## 2013-11-17 ENCOUNTER — Encounter (HOSPITAL_COMMUNITY): Payer: Self-pay | Admitting: Surgery

## 2013-11-21 ENCOUNTER — Encounter (INDEPENDENT_AMBULATORY_CARE_PROVIDER_SITE_OTHER): Payer: Self-pay

## 2013-11-29 ENCOUNTER — Encounter (INDEPENDENT_AMBULATORY_CARE_PROVIDER_SITE_OTHER): Payer: Self-pay | Admitting: Surgery

## 2013-12-01 ENCOUNTER — Encounter (INDEPENDENT_AMBULATORY_CARE_PROVIDER_SITE_OTHER): Payer: BC Managed Care – PPO | Admitting: Surgery

## 2013-12-07 ENCOUNTER — Encounter: Payer: Self-pay | Admitting: Gastroenterology

## 2013-12-14 ENCOUNTER — Other Ambulatory Visit: Payer: Self-pay | Admitting: *Deleted

## 2013-12-14 MED ORDER — METFORMIN HCL 500 MG PO TABS: 500.0000 mg | ORAL_TABLET | Freq: Two times a day (BID) | ORAL | Status: DC

## 2014-01-08 ENCOUNTER — Other Ambulatory Visit: Payer: Self-pay | Admitting: *Deleted

## 2014-01-08 MED ORDER — OMEPRAZOLE 20 MG PO CPDR: 20.0000 mg | DELAYED_RELEASE_CAPSULE | Freq: Every day | ORAL | Status: DC

## 2014-01-10 ENCOUNTER — Ambulatory Visit (INDEPENDENT_AMBULATORY_CARE_PROVIDER_SITE_OTHER): Payer: BC Managed Care – PPO

## 2014-01-10 DIAGNOSIS — Z23 Encounter for immunization: Secondary | ICD-10-CM

## 2014-03-14 ENCOUNTER — Telehealth: Payer: Self-pay | Admitting: *Deleted

## 2014-03-14 DIAGNOSIS — E785 Hyperlipidemia, unspecified: Secondary | ICD-10-CM

## 2014-03-14 MED ORDER — METFORMIN HCL 500 MG PO TABS: 500.0000 mg | ORAL_TABLET | Freq: Two times a day (BID) | ORAL | Status: DC

## 2014-03-14 NOTE — Telephone Encounter (Signed)
Spoke to pt and informed him metformin was sent to pharmacy this am. Pt states meds in question were zocor and synthroid; Rx sent as requested

## 2014-03-14 NOTE — Telephone Encounter (Signed)
Please address this.

## 2014-03-14 NOTE — Addendum Note (Signed)
Addended by: Modena Nunnery on: 03/14/2014 02:11 PM   Modules accepted: Orders

## 2014-03-14 NOTE — Telephone Encounter (Signed)
Pt request status of refill for metformin; spoke with Autumn at [facility] and working on refill now; pt said this is second request for refill and pt wants to know if our office has "glitch" in our computer system. Pt said he usually comes in once a year for visit and wants to know if pt does need to be seen prior to end of year or can pt wait until 09/2014? Pt request cb.

## 2014-03-15 ENCOUNTER — Other Ambulatory Visit (INDEPENDENT_AMBULATORY_CARE_PROVIDER_SITE_OTHER): Payer: BC Managed Care – PPO

## 2014-03-15 ENCOUNTER — Other Ambulatory Visit: Payer: Self-pay | Admitting: *Deleted

## 2014-03-15 ENCOUNTER — Other Ambulatory Visit: Payer: Self-pay | Admitting: Family Medicine

## 2014-03-15 DIAGNOSIS — Z0000 Encounter for general adult medical examination without abnormal findings: Secondary | ICD-10-CM

## 2014-03-15 DIAGNOSIS — E119 Type 2 diabetes mellitus without complications: Secondary | ICD-10-CM

## 2014-03-15 DIAGNOSIS — E785 Hyperlipidemia, unspecified: Secondary | ICD-10-CM

## 2014-03-15 DIAGNOSIS — I10 Essential (primary) hypertension: Secondary | ICD-10-CM

## 2014-03-15 LAB — COMPREHENSIVE METABOLIC PANEL
ALBUMIN: 4.2 g/dL (ref 3.5–5.2)
ALK PHOS: 48 U/L (ref 39–117)
ALT: 38 U/L (ref 0–53)
AST: 32 U/L (ref 0–37)
BILIRUBIN TOTAL: 0.6 mg/dL (ref 0.2–1.2)
BUN: 11 mg/dL (ref 6–23)
CO2: 27 mEq/L (ref 19–32)
Calcium: 9.4 mg/dL (ref 8.4–10.5)
Chloride: 103 mEq/L (ref 96–112)
Creatinine, Ser: 1 mg/dL (ref 0.4–1.5)
GFR: 80.49 mL/min (ref >60.00)
Glucose, Bld: 150 mg/dL — ABNORMAL HIGH (ref 70–99)
Potassium: 4.9 mEq/L (ref 3.5–5.1)
SODIUM: 139 meq/L (ref 135–145)
Total Protein: 6.7 g/dL (ref 6.0–8.3)

## 2014-03-15 LAB — CBC WITH DIFFERENTIAL/PLATELET
Basophils Absolute: 0 10*3/uL (ref 0.0–0.1)
Basophils Relative: 0.4 % (ref 0.0–3.0)
EOS ABS: 0.1 10*3/uL (ref 0.0–0.7)
Eosinophils Relative: 2.4 % (ref 0.0–5.0)
HCT: 43.6 % (ref 39.0–52.0)
Hemoglobin: 14.9 g/dL (ref 13.0–17.0)
LYMPHS ABS: 1.6 10*3/uL (ref 0.7–4.0)
Lymphocytes Relative: 26.4 % (ref 12.0–46.0)
MCHC: 34.2 g/dL (ref 30.0–36.0)
MCV: 101.2 fl — AB (ref 78.0–100.0)
MONO ABS: 0.5 10*3/uL (ref 0.1–1.0)
Monocytes Relative: 7.6 % (ref 3.0–12.0)
NEUTROS PCT: 63.2 % (ref 43.0–77.0)
Neutro Abs: 3.8 10*3/uL (ref 1.4–7.7)
PLATELETS: 238 10*3/uL (ref 150.0–400.0)
RBC: 4.31 Mil/uL (ref 4.22–5.81)
RDW: 12.9 % (ref 11.5–15.5)
WBC: 6 10*3/uL (ref 4.0–10.5)

## 2014-03-15 LAB — LIPID PANEL
CHOL/HDL RATIO: 4
Cholesterol: 155 mg/dL (ref 0–200)
HDL: 39.6 mg/dL (ref >39.00)
LDL Cholesterol: 95 mg/dL (ref 0–99)
NonHDL: 115.4
TRIGLYCERIDES: 104 mg/dL (ref 0.0–149.0)
VLDL: 20.8 mg/dL (ref 0.0–40.0)

## 2014-03-15 LAB — HEMOGLOBIN A1C: Hgb A1c MFr Bld: 6.8 % — ABNORMAL HIGH (ref 4.6–6.5)

## 2014-03-15 LAB — PSA: PSA: 0.36 ng/mL (ref 0.10–4.00)

## 2014-03-15 LAB — TSH: TSH: 1.43 u[IU]/mL (ref 0.35–4.50)

## 2014-03-15 LAB — T4, FREE: FREE T4: 0.78 ng/dL (ref 0.60–1.60)

## 2014-03-15 MED ORDER — SIMVASTATIN 40 MG PO TABS: 40.0000 mg | ORAL_TABLET | Freq: Every day | ORAL | Status: DC

## 2014-03-15 MED ORDER — LEVOTHYROXINE SODIUM 100 MCG PO TABS: 100.0000 ug | ORAL_TABLET | Freq: Every day | ORAL | Status: DC

## 2014-03-19 ENCOUNTER — Telehealth: Payer: Self-pay | Admitting: Family Medicine

## 2014-03-19 NOTE — Telephone Encounter (Signed)
emmi emailed °

## 2014-03-29 ENCOUNTER — Telehealth: Payer: Self-pay | Admitting: Family Medicine

## 2014-03-29 ENCOUNTER — Encounter: Payer: Self-pay | Admitting: Family Medicine

## 2014-03-29 ENCOUNTER — Ambulatory Visit (INDEPENDENT_AMBULATORY_CARE_PROVIDER_SITE_OTHER): Payer: BLUE CROSS/BLUE SHIELD | Admitting: Family Medicine

## 2014-03-29 VITALS — BP 158/88 | HR 69 | Temp 97.4°F | Wt 209.0 lb

## 2014-03-29 DIAGNOSIS — Z7189 Other specified counseling: Secondary | ICD-10-CM

## 2014-03-29 DIAGNOSIS — E119 Type 2 diabetes mellitus without complications: Secondary | ICD-10-CM

## 2014-03-29 DIAGNOSIS — E785 Hyperlipidemia, unspecified: Secondary | ICD-10-CM

## 2014-03-29 DIAGNOSIS — E039 Hypothyroidism, unspecified: Secondary | ICD-10-CM

## 2014-03-29 DIAGNOSIS — I10 Essential (primary) hypertension: Secondary | ICD-10-CM

## 2014-03-29 DIAGNOSIS — Z23 Encounter for immunization: Secondary | ICD-10-CM

## 2014-03-29 MED ORDER — LISINOPRIL-HYDROCHLOROTHIAZIDE 10-12.5 MG PO TABS: 1.0000 | ORAL_TABLET | Freq: Every day | ORAL | Status: DC

## 2014-03-29 NOTE — Assessment & Plan Note (Signed)
Discussed preventative measures- he has already received influenza vaccine. He is due for pneumococcal and zoster vaccines today- he agrees to receive them both today.  I did warn him that zostavax may not be covered by his insurance plan.  He still would like to have it today. Colonoscopy UTD- 05/2013- Dr. Deatra Ina. PSA wnl 03/15/14. Discussed smoking cessation.

## 2014-03-29 NOTE — Patient Instructions (Signed)
Good to see you. Stop taking your HCTZ 12.5 mg daily.  We are starting a new medication which has HCTZ in it.  Please come see me in 2 weeks- ok to make it on a Monday.

## 2014-03-29 NOTE — Assessment & Plan Note (Signed)
Remains elevated- not at goal for diabetic. I would prefer to add an ACEI for renal protection. D/c HCTZ 12. 5 mg daily. Start HCTZ 12.5 mg with lisinopril 10 mg daily (combo pill). Follow up with me in 2 weeks to recheck BP. The patient indicates understanding of these issues and agrees with the plan.

## 2014-03-29 NOTE — Telephone Encounter (Signed)
Ms Semidey dropped off paper from insurance company He stated he is pneumococcal vaccine and zoster (shingles) was coded as preventive is would cover 100% Paper on waynetta desk's

## 2014-03-29 NOTE — Assessment & Plan Note (Signed)
Improved now that he is taking his statin daily. No changes made today.

## 2014-03-29 NOTE — Assessment & Plan Note (Signed)
Well controlled on current rx. No changes made today.

## 2014-03-29 NOTE — Progress Notes (Signed)
Subjective:    Patient ID: Brian Casey, male    DOB: Sep 28, 1950, 64 y.o.   MRN: [id number]  HPI  64 yo very pleasant male here to discuss several issues.    Changing insurance companies next month- will not be as good coverage- wants to make sure he discusses preventative measures today.  Hypothyroidism- On synthroid 100 mcg. Denies any symptoms of hypo or hyperthyroidism. Lab Results  Component Value Date   TSH 1.43 03/15/2014   HLD- now at goal for a diabetic.   On Zocor 40 mg daily. Denies any myalgias. Lab Results  Component Value Date   CHOL 155 03/15/2014   HDL 39.60 03/15/2014   LDLCALC 95 03/15/2014   LDLDIRECT 140.6 02/10/2012   TRIG 104.0 03/15/2014   CHOLHDL 4 03/15/2014    DM- improved on Metformin 500 mg twice daily. Denies any episodes of hypoglycemia. Does not check his CBGs. Lab Results  Component Value Date   HGBA1C 6.8* 03/15/2014   Urine micro neg 03/2013. Last eye exam 02/22/14- Dr. Gloriann Loan.  HTN- started on HCTZ 12.5 mg daily by Dr. Lorelei Pont.  Has not noticed any side effects.  Bp still elevated today.  Denies HA, blurred vision, CP or SOB.  Lab Results  Component Value Date   WBC 6.0 03/15/2014   HGB 14.9 03/15/2014   HCT 43.6 03/15/2014   MCV 101.2* 03/15/2014   PLT 238.0 03/15/2014   Lab Results  Component Value Date   TSH 1.43 03/15/2014    Patient Active Problem List   Diagnosis Date Noted  . Umbilical hernia 03/25/7251  . 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 Essential hypertension 10/17/2013  . Weight gain 10/11/2013  . Swelling of right knee joint 10/11/2013  . Abdominal wall hernia 10/11/2013  . Puncture wound 08/18/2013  . Left low back pain 04/13/2013  . HLD (hyperlipidemia) 01/26/2011  . ANXIETY 12/26/2008  . HYPOTHYROIDISM 03/02/2007  . Diabetes 03/02/2007  . NICOTINE ADDICTION 03/02/2007  . DEPRESSION/ ANGER CONTROL 03/02/2007   Past Medical History  Diagnosis Date  . GERD (gastroesophageal reflux disease)   .  Thyroid disease   . Hypertension 10/17/2013  . Type II diabetes mellitus   . High cholesterol   . Hypothyroidism    Past Surgical History  Procedure Laterality Date  . Hemmorhoidectomy  1982  . Cyst removal hand Left finger  . Colonoscopy    . Umbilical hernia repair N/A 11/16/2013    Procedure: UMBILICAL HERNIA REPAIR;  Surgeon: Imogene Burn. Georgette Dover, MD;  Location: [place];  Service: General;  Laterality: N/A;  . Insertion of mesh N/A 11/16/2013    Procedure: INSERTION OF MESH;  Surgeon: Imogene Burn. Georgette Dover, MD;  Location: [place] [facility];  Service: General;  Laterality: N/A;   History  Substance Use Topics  . Smoking status: Current Some Day Smoker -- 0.25 packs/day for 30 years  . Smokeless tobacco: Never Used  . Alcohol Use: 8.4 oz/week    7 Cans of beer, 7 Shots of liquor per week   Family History  Problem Relation Age of Onset  . Cancer Mother     colon  . Hypertension Mother   . Diabetes Mother   . Depression Mother   . Colon cancer Mother   . Hyperthyroidism Father   . Hypertension Father   . Cancer Father     prostate  . Diabetes Maternal Grandmother   . Cancer Maternal Grandmother     lung, non smoker   Allergies  Allergen Reactions  .  Bupropion Hcl     REACTION: Severe constipation, insomnia  . Penicillins     REACTION: Vomiting   Current Outpatient Prescriptions on File Prior to Visit  Medication Sig Dispense Refill  . hydrochlorothiazide (HYDRODIURIL) 12.5 MG tablet Take 1 tablet (12.5 mg total) by mouth daily. 30 tablet 5  . levothyroxine (SYNTHROID, LEVOTHROID) 100 MCG tablet Take 1 tablet (100 mcg total) by mouth daily before breakfast. 30 tablet 6  . metFORMIN (GLUCOPHAGE) 500 MG tablet Take 1 tablet (500 mg total) by mouth 2 (two) times daily with a meal. 60 tablet 0  . omeprazole (PRILOSEC) 20 MG capsule Take 1 capsule (20 mg total) by mouth daily. 30 capsule 5  . simvastatin (ZOCOR) 40 MG tablet Take 1 tablet (40 mg total) by mouth at bedtime. 30 tablet 2   No  current facility-administered medications on file prior to visit.   The PMH, PSH, Social History, Family History, Medications, and allergies have been reviewed in [facility], and have been updated if relevant.   Review of Systems  See HPI No CP or SOB No HA No blurred vision No cough No nausea, vomiting or diarrhea No diaphoresis No LE edema No rashes No abdominal pain No anxiety or depression No fevers    Objective:   Physical Exam BP 158/88 mmHg  Pulse 69  Temp(Src) 97.4 F (36.3 C) (Oral)  Wt 209 lb (94.802 kg)  SpO2 97%  BP Readings from Last 3 Encounters:  03/29/14 158/88  11/16/13 148/67  11/10/13 150/63    General:  overweght male in NAD Eyes:  PERRL Ears:  External ear exam shows no significant lesions or deformities.  Otoscopic examination reveals clear canals, tympanic membranes are intact bilaterally without bulging, retraction, inflammation or discharge. Hearing is grossly normal bilaterally. Nose:  External nasal examination shows no deformity or inflammation. Nasal mucosa are pink and moist without lesions or exudates. Mouth:  Oral mucosa and oropharynx without lesions or exudates.  Teeth in good repair. Neck:  no carotid bruit or thyromegaly no cervical or supraclavicular lymphadenopathy  Lungs:  Normal respiratory effort, chest expands symmetrically. Lungs are clear to auscultation, no crackles or wheezes. Heart:  Normal rate and regular rhythm. S1 and S2 normal without gallop, murmur, click, rub or other extra sounds. Abdomen:  Bowel sounds positive,abdomen soft and non-tender Ext:  No edema Psych:   Good eye contact, not anxious or depressed appearing Skin:   No rashes  Assessment & Plan:

## 2014-03-29 NOTE — Progress Notes (Signed)
Pre visit review using our clinic review tool, if applicable. No additional management support is needed unless otherwise documented below in the visit note. 

## 2014-03-29 NOTE — Assessment & Plan Note (Signed)
Well controlled. On Statin, adding ACEI. prevnar 13 given today. Lipids at goal. Urine micro due- will check this today.

## 2014-03-30 ENCOUNTER — Telehealth: Payer: Self-pay | Admitting: Family Medicine

## 2014-03-30 ENCOUNTER — Encounter: Payer: Self-pay | Admitting: Family Medicine

## 2014-03-30 LAB — MICROALBUMIN / CREATININE URINE RATIO
CREATININE, U: 81.9 mg/dL
MICROALB UR: 0.1 mg/dL (ref 0.0–1.9)
MICROALB/CREAT RATIO: 0.1 mg/g (ref 0.0–30.0)

## 2014-03-30 NOTE — Telephone Encounter (Signed)
emmi emailed °

## 2014-03-30 NOTE — Telephone Encounter (Signed)
Form placed in Dr Hulen Shouts in box for review to ensure correct coding

## 2014-04-02 ENCOUNTER — Encounter: Payer: Self-pay | Admitting: Family Medicine

## 2014-04-08 ENCOUNTER — Other Ambulatory Visit: Payer: Self-pay | Admitting: Family Medicine

## 2014-04-08 ENCOUNTER — Encounter: Payer: Self-pay | Admitting: Family Medicine

## 2014-04-09 ENCOUNTER — Other Ambulatory Visit: Payer: Self-pay | Admitting: Family Medicine

## 2014-04-09 DIAGNOSIS — E785 Hyperlipidemia, unspecified: Secondary | ICD-10-CM

## 2014-04-11 ENCOUNTER — Encounter: Payer: Self-pay | Admitting: Family Medicine

## 2014-04-12 ENCOUNTER — Ambulatory Visit: Payer: BLUE CROSS/BLUE SHIELD | Admitting: Family Medicine

## 2014-04-12 MED ORDER — LEVOTHYROXINE SODIUM 100 MCG PO TABS: 100.0000 ug | ORAL_TABLET | Freq: Every day | ORAL | Status: DC

## 2014-04-12 MED ORDER — METFORMIN HCL 500 MG PO TABS: 500.0000 mg | ORAL_TABLET | Freq: Two times a day (BID) | ORAL | Status: DC

## 2014-04-12 MED ORDER — SIMVASTATIN 40 MG PO TABS: 40.0000 mg | ORAL_TABLET | Freq: Every day | ORAL | Status: DC

## 2014-04-12 NOTE — Addendum Note (Signed)
Addended by: Modena Nunnery on: 04/12/2014 11:09 AM   Modules accepted: Orders

## 2014-04-16 ENCOUNTER — Other Ambulatory Visit: Payer: Self-pay | Admitting: *Deleted

## 2014-04-16 ENCOUNTER — Ambulatory Visit (INDEPENDENT_AMBULATORY_CARE_PROVIDER_SITE_OTHER): Payer: BLUE CROSS/BLUE SHIELD | Admitting: Podiatry

## 2014-04-16 ENCOUNTER — Ambulatory Visit: Payer: BLUE CROSS/BLUE SHIELD | Admitting: Family Medicine

## 2014-04-16 ENCOUNTER — Encounter: Payer: Self-pay | Admitting: Podiatry

## 2014-04-16 ENCOUNTER — Encounter: Payer: Self-pay | Admitting: Family Medicine

## 2014-04-16 VITALS — BP 174/100 | HR 71 | Resp 16 | Ht 68.0 in | Wt 208.0 lb

## 2014-04-16 DIAGNOSIS — L600 Ingrowing nail: Secondary | ICD-10-CM

## 2014-04-16 MED ORDER — OMEPRAZOLE 20 MG PO CPDR: 20.0000 mg | DELAYED_RELEASE_CAPSULE | Freq: Every day | ORAL | Status: DC

## 2014-04-16 MED ORDER — NEOMYCIN-POLYMYXIN-HC 3.5-10000-1 OT SOLN: OTIC | Status: DC

## 2014-04-16 NOTE — Telephone Encounter (Signed)
See my chart message. Refill for Omeprazole sent per patient's request.

## 2014-04-16 NOTE — Patient Instructions (Signed)

## 2014-04-16 NOTE — Progress Notes (Signed)
   Subjective:    Patient ID: Brian Casey, male    DOB: September 23, 1950, 64 y.o.   MRN: [id number]  HPI Comments: Its my right big toenail. Sometimes it will hurt and throb. It will get sore and red. This has been going on for years. Its gotten worse. i go bare foot at times. i will keep it clean with soap. i trim my toenails.     Review of Systems  All other systems reviewed and are negative.      Objective:   Physical Exam: I have reviewed his past medical history medications allergies surgery social history and review of systems. Pulses are strongly palpable. Neurologic sensorium is intact per Semmes-Weinstein monofilament. Deep tendon reflexes are intact bilateral muscle strength is 5 over 5 dorsiflexion plantar flexors and inverters and everters all intrinsic musculature is intact. Orthopedic evaluation demonstrate all joints distal to the ankle have a full range of motion without crepitation. Cutaneous evaluation demonstrates supple well-hydrated cues with exception of a sharp rate and now margin along the fibular border of the hallux right. There is mild erythema no edema cellulitis drainage or odor. That is tender on palpation.        Assessment & Plan:  Assessment: Non-insulin-dependent diabetes mellitus non-complicated with an ingrown nail hallux right fibular border.  Plan: Chemical matrixectomy was performed today after local anesthesia was administered. He tolerated procedure well and was given both oral and written home-going instructions for the care of the matrixectomy to the fibular border of the hallux right. Prescription for Cortisporin Otic was also dispensed and will follow up with him in 1 week. He will notify us sooner with questions or concerns such as infection.

## 2014-04-17 ENCOUNTER — Encounter: Payer: Self-pay | Admitting: Family Medicine

## 2014-04-17 ENCOUNTER — Ambulatory Visit (INDEPENDENT_AMBULATORY_CARE_PROVIDER_SITE_OTHER): Payer: BLUE CROSS/BLUE SHIELD | Admitting: Family Medicine

## 2014-04-17 VITALS — BP 126/70 | HR 70 | Temp 97.3°F | Ht 68.0 in | Wt 210.4 lb

## 2014-04-17 DIAGNOSIS — E119 Type 2 diabetes mellitus without complications: Secondary | ICD-10-CM | POA: Diagnosis not present

## 2014-04-17 DIAGNOSIS — I10 Essential (primary) hypertension: Secondary | ICD-10-CM | POA: Diagnosis not present

## 2014-04-17 MED ORDER — GLUCOSE BLOOD VI STRP
ORAL_STRIP | Status: DC
Start: 2014-04-17 — End: 2014-04-18

## 2014-04-17 MED ORDER — ONETOUCH BASIC SYSTEM W/DEVICE KIT: PACK | Status: DC

## 2014-04-17 NOTE — Patient Instructions (Signed)
Great to see you. Let me know how your blood sugars are doing.  Your blood pressure is great.

## 2014-04-17 NOTE — Assessment & Plan Note (Signed)
Improved on current rx. No changes made.

## 2014-04-17 NOTE — Progress Notes (Signed)
Subjective:   Patient ID: Brian Casey, male    DOB: [birth year], 64 y.o.   MRN: [id number]  Brian Casey is a pleasant 64 y.o. year old male who presents to clinic today with Follow-up  on 04/17/2014  HPI:  When I last saw him on 03/29/14, BP was not quite at goal for a diabetic.  Had recently been started on HCTZ 12.5 mg by Dr. Lorelei Pont.  I d/c'd his HCTZ and started and HCTZ/lisinopril combo pill- 12.5- 10 mg for renal protection and improvement of blood pressure.  He is here today to follow up his blood pressure.  He has not noticed any side effects, in fact, he feels his sexual function has actually improved.    Lab Results  Component Value Date   CREATININE 1.0 03/15/2014   DM- has been controlled on Metformin but does not check FSBS regularly.  He is asking if he needs to do this.  Denies any symptoms of hypoglycemia.   Current Outpatient Prescriptions on File Prior to Visit  Medication Sig Dispense Refill  . levothyroxine (SYNTHROID, LEVOTHROID) 100 MCG tablet Take 1 tablet (100 mcg total) by mouth daily before breakfast. 90 tablet 1  . lisinopril-hydrochlorothiazide (PRINZIDE,ZESTORETIC) 10-12.5 MG per tablet Take 1 tablet by mouth daily. 90 tablet 3  . metFORMIN (GLUCOPHAGE) 500 MG tablet Take 1 tablet (500 mg total) by mouth 2 (two) times daily with a meal. 180 tablet 1  . neomycin-polymyxin-hydrocortisone (CORTISPORIN) otic solution APPLY ONE TO TWO DROPS TO AFFECTED TOE TWICE DAILY 10 mL 1  . omeprazole (PRILOSEC) 20 MG capsule Take 1 capsule (20 mg total) by mouth daily. 90 capsule 1  . simvastatin (ZOCOR) 40 MG tablet Take 1 tablet (40 mg total) by mouth at bedtime. 90 tablet 1   No current facility-administered medications on file prior to visit.    Allergies  Allergen Reactions  . Bupropion Hcl     REACTION: Severe constipation, insomnia  . Penicillins     REACTION: Vomiting    Past Medical History  Diagnosis Date  . GERD (gastroesophageal reflux disease)     . Thyroid disease   . Hypertension 10/17/2013  . Type II diabetes mellitus   . High cholesterol   . Hypothyroidism     Past Surgical History  Procedure Laterality Date  . Hemmorhoidectomy  1982  . Cyst removal hand Left finger  . Colonoscopy    . Umbilical hernia repair N/A 11/16/2013    Procedure: UMBILICAL HERNIA REPAIR;  Surgeon: Imogene Burn. Georgette Dover, MD;  Location: [place];  Service: General;  Laterality: N/A;  . Insertion of mesh N/A 11/16/2013    Procedure: INSERTION OF MESH;  Surgeon: Imogene Burn. Georgette Dover, MD;  Location: [place] [facility];  Service: General;  Laterality: N/A;    Family History  Problem Relation Age of Onset  . Cancer Mother     colon  . Hypertension Mother   . Diabetes Mother   . Depression Mother   . Colon cancer Mother   . Hyperthyroidism Father   . Hypertension Father   . Cancer Father     prostate  . Diabetes Maternal Grandmother   . Cancer Maternal Grandmother     lung, non smoker    History   Social History  . Marital Status: Married    Spouse Name: N/A    Number of Children: 2  . Years of Education: N/A   Occupational History  . Product Engineer-terminated, Engineer, manufacturing systems  Social History Main Topics  . Smoking status: Current Some Day Smoker -- 0.25 packs/day for 30 years  . Smokeless tobacco: Never Used  . Alcohol Use: 8.4 oz/week    7 Cans of beer, 7 Shots of liquor per week  . Drug Use: No  . Sexual Activity: Not on file   Other Topics Concern  . Not on file   Social History Narrative   The PMH, PSH, Social History, Family History, Medications, and allergies have been reviewed in [facility], and have been updated if relevant.    Review of Systems  Constitutional: Negative.   Eyes: Negative.   Endocrine: Negative.   Genitourinary: Negative.   Musculoskeletal: Negative.   Skin: Negative.   Hematological: Negative.   Psychiatric/Behavioral: Negative.   All other systems reviewed and are negative.      Objective:     BP 126/70 mmHg  Pulse 70  Temp(Src) 97.3 F (36.3 C) (Oral)  Ht _0  (1.727 m)  Wt 210 lb 6.4 oz (95.437 kg)  BMI 32.00 kg/m2  SpO2 98%   Physical Exam  Constitutional: He is oriented to person, place, and time. He appears well-developed and well-nourished. No distress.  HENT:  Head: Normocephalic.  Eyes: Conjunctivae are normal.  Neck: Normal range of motion.  Cardiovascular: Normal rate and regular rhythm.   Pulmonary/Chest: Effort normal and breath sounds normal. No respiratory distress.  Abdominal: Soft.  Musculoskeletal: Normal range of motion.  Neurological: He is alert and oriented to person, place, and time. No cranial nerve deficit.  Skin: Skin is warm and dry.  Psychiatric: He has a normal mood and affect. His behavior is normal. Judgment and thought content normal.  Nursing note and vitals reviewed.         Assessment & Plan:   Type 2 diabetes mellitus without complication  Essential hypertension No Follow-up on file.

## 2014-04-17 NOTE — Progress Notes (Signed)
Pre visit review using our clinic review tool, if applicable. No additional management support is needed unless otherwise documented below in the visit note.

## 2014-04-17 NOTE — Assessment & Plan Note (Signed)
Improved. Discussed FSBS checks- it would be good for him to check them intermittently so we know his ranges and how he feels.  He has not had any symptoms of hypoglycemia which is encouraging. Now on ACEI for renal protection. eRx sent for glucometer and test strips.

## 2014-04-18 ENCOUNTER — Encounter: Payer: Self-pay | Admitting: Family Medicine

## 2014-04-18 MED ORDER — GLUCOSE BLOOD VI STRP
ORAL_STRIP | Status: DC
Start: 2014-04-18 — End: 2015-11-07

## 2014-04-23 ENCOUNTER — Ambulatory Visit (INDEPENDENT_AMBULATORY_CARE_PROVIDER_SITE_OTHER): Payer: BLUE CROSS/BLUE SHIELD | Admitting: Podiatry

## 2014-04-23 DIAGNOSIS — L600 Ingrowing nail: Secondary | ICD-10-CM

## 2014-04-23 NOTE — Progress Notes (Signed)
He presents today for follow-up of matrixectomy fibular border hallux right. He states that he's been soaking in Betadine and warm water and the toe feels much better.  Objective: I'll signs are stable he is alert and oriented 3. Pulses are palpable no erythema or edema cellulitis drainage or odor to the surgical site.  Assessment: Well-healed surgical toe hallux right.  Plan: Discontinue Betadine start with Epsom salts and warm water soaks covered in the daily Lopid and I continues open to completely resolved.

## 2014-05-26 ENCOUNTER — Encounter: Payer: Self-pay | Admitting: Family Medicine

## 2014-05-28 ENCOUNTER — Other Ambulatory Visit: Payer: Self-pay | Admitting: Family Medicine

## 2014-05-28 MED ORDER — HYDROCHLOROTHIAZIDE 12.5 MG PO CAPS: 12.5000 mg | ORAL_CAPSULE | Freq: Every day | ORAL | Status: DC

## 2014-09-19 ENCOUNTER — Other Ambulatory Visit: Payer: Self-pay | Admitting: Family Medicine

## 2014-10-24 ENCOUNTER — Other Ambulatory Visit: Payer: Self-pay | Admitting: Family Medicine

## 2014-10-24 DIAGNOSIS — Z0000 Encounter for general adult medical examination without abnormal findings: Secondary | ICD-10-CM

## 2014-10-24 DIAGNOSIS — E0580 Other thyrotoxicosis without thyrotoxic crisis or storm: Secondary | ICD-10-CM

## 2014-10-24 DIAGNOSIS — E039 Hypothyroidism, unspecified: Secondary | ICD-10-CM

## 2014-10-24 DIAGNOSIS — E785 Hyperlipidemia, unspecified: Secondary | ICD-10-CM

## 2014-10-24 DIAGNOSIS — E119 Type 2 diabetes mellitus without complications: Secondary | ICD-10-CM

## 2014-10-30 ENCOUNTER — Other Ambulatory Visit (INDEPENDENT_AMBULATORY_CARE_PROVIDER_SITE_OTHER): Payer: 59

## 2014-10-30 DIAGNOSIS — Z0000 Encounter for general adult medical examination without abnormal findings: Secondary | ICD-10-CM

## 2014-10-30 DIAGNOSIS — E119 Type 2 diabetes mellitus without complications: Secondary | ICD-10-CM | POA: Diagnosis not present

## 2014-10-30 DIAGNOSIS — E785 Hyperlipidemia, unspecified: Secondary | ICD-10-CM

## 2014-10-30 DIAGNOSIS — E039 Hypothyroidism, unspecified: Secondary | ICD-10-CM

## 2014-10-30 LAB — COMPREHENSIVE METABOLIC PANEL
ALBUMIN: 4.4 g/dL (ref 3.5–5.2)
ALK PHOS: 49 U/L (ref 39–117)
ALT: 32 U/L (ref 0–53)
AST: 26 U/L (ref 0–37)
BUN: 10 mg/dL (ref 6–23)
CALCIUM: 9.4 mg/dL (ref 8.4–10.5)
CO2: 32 mEq/L (ref 19–32)
CREATININE: 1.05 mg/dL (ref 0.40–1.50)
Chloride: 97 mEq/L (ref 96–112)
GFR: 75.93 mL/min (ref >60.00)
Glucose, Bld: 155 mg/dL — ABNORMAL HIGH (ref 70–99)
Potassium: 4.2 mEq/L (ref 3.5–5.1)
Sodium: 136 mEq/L (ref 135–145)
Total Bilirubin: 0.7 mg/dL (ref 0.2–1.2)
Total Protein: 6.9 g/dL (ref 6.0–8.3)

## 2014-10-30 LAB — CBC WITH DIFFERENTIAL/PLATELET
BASOS PCT: 0.3 % (ref 0.0–3.0)
Basophils Absolute: 0 10*3/uL (ref 0.0–0.1)
EOS ABS: 0.1 10*3/uL (ref 0.0–0.7)
Eosinophils Relative: 1.8 % (ref 0.0–5.0)
HEMATOCRIT: 44 % (ref 39.0–52.0)
HEMOGLOBIN: 15.1 g/dL (ref 13.0–17.0)
LYMPHS ABS: 1.1 10*3/uL (ref 0.7–4.0)
Lymphocytes Relative: 17.5 % (ref 12.0–46.0)
MCHC: 34.4 g/dL (ref 30.0–36.0)
MCV: 99.6 fl (ref 78.0–100.0)
Monocytes Absolute: 0.4 10*3/uL (ref 0.1–1.0)
Monocytes Relative: 6.6 % (ref 3.0–12.0)
NEUTROS ABS: 4.8 10*3/uL (ref 1.4–7.7)
Neutrophils Relative %: 73.8 % (ref 43.0–77.0)
PLATELETS: 240 10*3/uL (ref 150.0–400.0)
RBC: 4.42 Mil/uL (ref 4.22–5.81)
RDW: 13.4 % (ref 11.5–15.5)
WBC: 6.5 10*3/uL (ref 4.0–10.5)

## 2014-10-30 LAB — MICROALBUMIN / CREATININE URINE RATIO
Creatinine,U: 106.3 mg/dL
Microalb Creat Ratio: 0.7 mg/g (ref 0.0–30.0)

## 2014-10-30 LAB — PSA: PSA: 0.39 ng/mL (ref 0.10–4.00)

## 2014-10-30 LAB — T4, FREE: Free T4: 0.86 ng/dL (ref 0.60–1.60)

## 2014-10-30 LAB — LIPID PANEL
CHOL/HDL RATIO: 4
Cholesterol: 150 mg/dL (ref 0–200)
HDL: 42.3 mg/dL (ref >39.00)
LDL CALC: 88 mg/dL (ref 0–99)
NONHDL: 107.87
TRIGLYCERIDES: 99 mg/dL (ref 0.0–149.0)
VLDL: 19.8 mg/dL (ref 0.0–40.0)

## 2014-10-30 LAB — TSH: TSH: 1.88 u[IU]/mL (ref 0.35–4.50)

## 2014-10-30 LAB — HEMOGLOBIN A1C: Hgb A1c MFr Bld: 6.5 % (ref 4.6–6.5)

## 2014-11-06 ENCOUNTER — Encounter: Payer: Self-pay | Admitting: Family Medicine

## 2014-11-06 ENCOUNTER — Ambulatory Visit (INDEPENDENT_AMBULATORY_CARE_PROVIDER_SITE_OTHER): Payer: 59 | Admitting: Family Medicine

## 2014-11-06 VITALS — BP 140/78 | HR 70 | Temp 97.5°F | Ht 67.0 in | Wt 203.0 lb

## 2014-11-06 DIAGNOSIS — E785 Hyperlipidemia, unspecified: Secondary | ICD-10-CM | POA: Diagnosis not present

## 2014-11-06 DIAGNOSIS — F411 Generalized anxiety disorder: Secondary | ICD-10-CM

## 2014-11-06 DIAGNOSIS — Z23 Encounter for immunization: Secondary | ICD-10-CM | POA: Diagnosis not present

## 2014-11-06 DIAGNOSIS — E039 Hypothyroidism, unspecified: Secondary | ICD-10-CM

## 2014-11-06 DIAGNOSIS — I10 Essential (primary) hypertension: Secondary | ICD-10-CM | POA: Diagnosis not present

## 2014-11-06 DIAGNOSIS — Z0000 Encounter for general adult medical examination without abnormal findings: Secondary | ICD-10-CM | POA: Diagnosis not present

## 2014-11-06 DIAGNOSIS — E119 Type 2 diabetes mellitus without complications: Secondary | ICD-10-CM

## 2014-11-06 MED ORDER — ALPRAZOLAM 0.25 MG PO TABS: 0.2500 mg | ORAL_TABLET | Freq: Two times a day (BID) | ORAL | Status: DC | PRN

## 2014-11-06 NOTE — Patient Instructions (Signed)
Great to see you. Hang in there at work.

## 2014-11-06 NOTE — Assessment & Plan Note (Signed)
Rx for xanax refilled. He is aware of sedation and addiction potential.

## 2014-11-06 NOTE — Assessment & Plan Note (Signed)
Well controlled on current rx. No changes made today. 

## 2014-11-06 NOTE — Assessment & Plan Note (Signed)
Well controlled on current rx. No changes made. Neg urine micro. On statiin- LDL at goal.

## 2014-11-06 NOTE — Progress Notes (Signed)
Subjective:   Patient ID: Brian Casey, male    DOB: 08-26-1950, 64 y.o.   MRN: [id number]  Brian Casey is a pleasant 64 y.o. year old male who presents to clinic today with Annual Exam and follow up of chronic medical conditions on 11/06/2014  HPI:  Zostavax 04/08/14 Colonoscopy 05/2013 Td 07/2013  Overall doing well, increased work stressors.  Hoping to retire in the next 3 months.  Asking for xanax refilled. Has only used it a few times in a few years.  Hypothyroidism- On synthroid 100 mcg. Denies any symptoms of hypo or hyperthyroidism. Lab Results  Component Value Date   TSH 1.88 10/30/2014    HLD- now at goal for a diabetic.   On Zocor 40 mg daily. Denies any myalgias. Lab Results  Component Value Date   CHOL 150 10/30/2014   HDL 42.30 10/30/2014   LDLCALC 88 10/30/2014   LDLDIRECT 140.6 02/10/2012   TRIG 99.0 10/30/2014   CHOLHDL 4 10/30/2014     DM- improved on Metformin 500 mg twice daily. Denies any episodes of hypoglycemia. Does not check his CBGs. Lab Results   Lab Results  Component Value Date   HGBA1C 6.5 10/30/2014    Urine micro neg 10/30/2014. Last eye exam 02/22/14- Dr. Gloriann Loan.  HTN- started on HCTZ 12.5 mg daily by Dr. Lorelei Pont.  Has not noticed any side effects.  Bp still elevated today.  Denies HA, blurred vision, CP or SOB.  Lab Results  Component Value Date   CREATININE 1.05 10/30/2014   Lab Results  Component Value Date   PSA 0.39 10/30/2014   PSA 0.36 03/15/2014   PSA 0.36 04/06/2013   Current Outpatient Prescriptions on File Prior to Visit  Medication Sig Dispense Refill  . Blood Glucose Monitoring Suppl ([place]) W/DEVICE KIT Use directed- may check blood sugars as needed and up to 3 times daily 1 each 0  . glucose blood test strip Use as instructed 100 each 12  . hydrochlorothiazide (MICROZIDE) 12.5 MG capsule TAKE 1 CAPSULE (12.5 MG TOTAL) BY MOUTH DAILY. 30 capsule 3  . levothyroxine (SYNTHROID, LEVOTHROID)  100 MCG tablet Take 1 tablet (100 mcg total) by mouth daily before breakfast. 90 tablet 1  . metFORMIN (GLUCOPHAGE) 500 MG tablet Take 1 tablet (500 mg total) by mouth 2 (two) times daily with a meal. 180 tablet 1  . omeprazole (PRILOSEC) 20 MG capsule Take 1 capsule (20 mg total) by mouth daily. 90 capsule 1  . simvastatin (ZOCOR) 40 MG tablet Take 1 tablet (40 mg total) by mouth at bedtime. 90 tablet 1   No current facility-administered medications on file prior to visit.    Allergies  Allergen Reactions  . Bupropion Hcl     REACTION: Severe constipation, insomnia  . Penicillins     REACTION: Vomiting    Past Medical History  Diagnosis Date  . GERD (gastroesophageal reflux disease)   . Thyroid disease   . Hypertension 10/17/2013  . Type II diabetes mellitus   . High cholesterol   . Hypothyroidism     Past Surgical History  Procedure Laterality Date  . Hemmorhoidectomy  1982  . Cyst removal hand Left finger  . Colonoscopy    . Umbilical hernia repair N/A 11/16/2013    Procedure: UMBILICAL HERNIA REPAIR;  Surgeon: Imogene Burn. Georgette Dover, MD;  Location: [place];  Service: General;  Laterality: N/A;  . Insertion of mesh N/A 11/16/2013    Procedure: INSERTION OF MESH;  Surgeon: Imogene Burn. Georgette Dover, MD;  Location: [place] [facility];  Service: General;  Laterality: N/A;    Family History  Problem Relation Age of Onset  . Cancer Mother     colon  . Hypertension Mother   . Diabetes Mother   . Depression Mother   . Colon cancer Mother   . Hyperthyroidism Father   . Hypertension Father   . Cancer Father     prostate  . Diabetes Maternal Grandmother   . Cancer Maternal Grandmother     lung, non smoker    Social History   Social History  . Marital Status: Married    Spouse Name: N/A  . Number of Children: 2  . Years of Education: N/A   Occupational History  . Product Engineer-terminated, Engineer, manufacturing systems    Social History Main Topics  . Smoking status: Current Some  Day Smoker -- 0.25 packs/day for 30 years  . Smokeless tobacco: Never Used  . Alcohol Use: 8.4 oz/week    7 Cans of beer, 7 Shots of liquor per week  . Drug Use: No  . Sexual Activity: Not on file   Other Topics Concern  . Not on file   Social History Narrative   The PMH, PSH, Social History, Family History, Medications, and allergies have been reviewed in [facility], and have been updated if relevant.  Review of Systems  Constitutional: Negative.   HENT: Negative.   Eyes: Negative.   Respiratory: Negative.   Cardiovascular: Negative.   Gastrointestinal: Negative.   Endocrine: Negative.   Genitourinary: Negative.   Musculoskeletal: Negative.   Skin: Negative.   Allergic/Immunologic: Negative.   Neurological: Negative.   Hematological: Negative.   Psychiatric/Behavioral: Negative for suicidal ideas, hallucinations, sleep disturbance and self-injury. The patient is nervous/anxious. The patient is not hyperactive.   All other systems reviewed and are negative.      Objective:    BP 140/78 mmHg  Pulse 70  Temp(Src) 97.5 F (36.4 C) (Oral)  Ht _0  (1.702 m)  Wt 203 lb (92.08 kg)  BMI 31.79 kg/m2  SpO2 96%  Wt Readings from Last 3 Encounters:  11/06/14 203 lb (92.08 kg)  04/17/14 210 lb 6.4 oz (95.437 kg)  04/16/14 208 lb (94.348 kg)    Physical Exam General:  overweght male in NAD Eyes:  PERRL Ears:  External ear exam shows no significant lesions or deformities.  Otoscopic examination reveals clear canals, tympanic membranes are intact bilaterally without bulging, retraction, inflammation or discharge. Hearing is grossly normal bilaterally. Nose:  External nasal examination shows no deformity or inflammation. Nasal mucosa are pink and moist without lesions or exudates. Mouth:  Oral mucosa and oropharynx without lesions or exudates.  Teeth in good repair. Neck:  no carotid bruit or thyromegaly no cervical or supraclavicular lymphadenopathy  Lungs:  Normal respiratory  effort, chest expands symmetrically. Lungs are clear to auscultation, no crackles or wheezes. Heart:  Normal rate and regular rhythm. S1 and S2 normal without gallop, murmur, click, rub or other extra sounds. Abdomen:  Bowel sounds positive,abdomen soft and non-tender without masses, organomegaly or hernias noted. Genitalia:  Testes bilaterally descended without nodularity, tenderness or masses. No scrotal masses or lesions. No penis lesions or urethral discharge. Prostate:  Prostate gland firm and smooth, 1 plus enlargement, no nodularity, tenderness, mass, asymmetry or induration. Pulses:  R and L posterior tibial pulses are full and equal bilaterally  Extremities:  no edema        Assessment &  Plan:   Visit for well man health check  Anxiety state  Type 2 diabetes mellitus without complication  HLD (hyperlipidemia)  Essential hypertension  Hypothyroidism, unspecified hypothyroidism type No Follow-up on file.

## 2014-11-06 NOTE — Assessment & Plan Note (Signed)
Well controlled on current rx. No changes made.

## 2014-11-06 NOTE — Addendum Note (Signed)
Addended by: Modena Nunnery on: 11/06/2014 12:54 PM   Modules accepted: Orders

## 2014-11-06 NOTE — Progress Notes (Signed)
Pre visit review using our clinic review tool, if applicable. No additional management support is needed unless otherwise documented below in the visit note. 

## 2014-11-06 NOTE — Assessment & Plan Note (Signed)
Reviewed preventive care protocols, scheduled due services, and updated immunizations Discussed nutrition, exercise, diet, and healthy lifestyle.  Prevnar 13 given today.

## 2014-11-23 ENCOUNTER — Other Ambulatory Visit: Payer: Self-pay | Admitting: Family Medicine

## 2014-12-03 ENCOUNTER — Encounter: Payer: Self-pay | Admitting: Internal Medicine

## 2014-12-03 ENCOUNTER — Ambulatory Visit (INDEPENDENT_AMBULATORY_CARE_PROVIDER_SITE_OTHER): Payer: 59 | Admitting: Internal Medicine

## 2014-12-03 VITALS — BP 144/96 | HR 73 | Temp 98.4°F | Wt 204.0 lb

## 2014-12-03 DIAGNOSIS — J069 Acute upper respiratory infection, unspecified: Secondary | ICD-10-CM

## 2014-12-03 MED ORDER — HYDROCODONE-HOMATROPINE 5-1.5 MG/5ML PO SYRP
5.0000 mL | ORAL_SOLUTION | Freq: Three times a day (TID) | ORAL | Status: DC | PRN
Start: 2014-12-03 — End: 2015-11-04

## 2014-12-03 MED ORDER — AZITHROMYCIN 250 MG PO TABS: ORAL_TABLET | ORAL | Status: DC

## 2014-12-03 NOTE — Progress Notes (Signed)
Pre visit review using our clinic review tool, if applicable. No additional management support is needed unless otherwise documented below in the visit note.

## 2014-12-03 NOTE — Progress Notes (Signed)
HPI  Pt presents to the clinic today with c/o headache, sore throat and cough. This started 5 days ago but seems to be getting worse. He denies difficulty swallowing but his throat does feel inflamed. The cough is nonproductive. He denies shortness of breath. He reports he has run low grade fevers, and has had chills and body aches. He has not tried anything OTC. He has had sick contacts. He does not smoke. He has no history of allergies or breathing problems.  Review of Systems      Past Medical History  Diagnosis Date  . GERD (gastroesophageal reflux disease)   . Thyroid disease   . Hypertension 10/17/2013  . Type II diabetes mellitus   . High cholesterol   . Hypothyroidism     Family History  Problem Relation Age of Onset  . Cancer Mother     colon  . Hypertension Mother   . Diabetes Mother   . Depression Mother   . Colon cancer Mother   . Hyperthyroidism Father   . Hypertension Father   . Cancer Father     prostate  . Diabetes Maternal Grandmother   . Cancer Maternal Grandmother     lung, non smoker    Social History   Social History  . Marital Status: Married    Spouse Name: N/A  . Number of Children: 2  . Years of Education: N/A   Occupational History  . Product Engineer-terminated, Engineer, manufacturing systems    Social History Main Topics  . Smoking status: Current Some Day Smoker -- 0.13 packs/day for 30 years    Types: Cigarettes  . Smokeless tobacco: Never Used  . Alcohol Use: 8.4 oz/week    7 Cans of beer, 7 Shots of liquor per week  . Drug Use: No  . Sexual Activity: Not on file   Other Topics Concern  . Not on file   Social History Narrative    Allergies  Allergen Reactions  . Bupropion Hcl     REACTION: Severe constipation, insomnia  . Penicillins     REACTION: Vomiting     Constitutional: Positive headache, fatigue and fever. Denies abrupt weight changes.  HEENT:  Positive sore throat. Denies eye redness, eye pain,  pressure behind the eyes, facial pain, nasal congestion, ear pain, ringing in the ears, wax buildup, runny nose or bloody nose. Respiratory: Positive cough. Denies difficulty breathing or shortness of breath.  Cardiovascular: Denies chest pain, chest tightness, palpitations or swelling in the hands or feet.   No other specific complaints in a complete review of systems (except as listed in HPI above).  Objective:   BP 144/96 mmHg  Pulse 73  Temp(Src) 98.4 F (36.9 C) (Oral)  Wt 204 lb (92.534 kg)  SpO2 98% Wt Readings from Last 3 Encounters:  12/03/14 204 lb (92.534 kg)  11/06/14 203 lb (92.08 kg)  04/17/14 210 lb 6.4 oz (95.437 kg)     General: Appears his stated age, in NAD. HEENT: Head: normal shape and size, no sinus tenderness noted; Eyes: sclera white, no icterus, conjunctiva pink; Ears: Tm's gray and intact, normal light reflex; Nose: mucosa pink and moist, septum midline; Throat/Mouth: + PND. Teeth present, mucosa erythematous and moist, no exudate noted, no lesions or ulcerations noted.  Neck: No cervical lymphadenopathy.  Cardiovascular: Normal rate and rhythm. S1,S2 noted.  No murmur, rubs or gallops noted.  Pulmonary/Chest: Normal effort and positive vesicular breath sounds. No respiratory distress. No wheezes, rales or ronchi  noted.      Assessment & Plan:   Upper Respiratory Infection:  Get some rest and drink plenty of water Do salt water gargles for the sore throat eRx for Azithromax x 5 days eRx for Hycodan cough syrup Ibuprofen as needed for fever and body aches  RTC as needed or if symptoms persist.

## 2014-12-03 NOTE — Patient Instructions (Signed)
Upper Respiratory Infection, Adult An upper respiratory infection (URI) is also sometimes known as the common cold. The upper respiratory tract includes the nose, sinuses, throat, trachea, and bronchi. Bronchi are the airways leading to the lungs. Most people improve within 1 week, but symptoms can last up to 2 weeks. A residual cough may last even longer.  CAUSES Many different viruses can infect the tissues lining the upper respiratory tract. The tissues become irritated and inflamed and often become very moist. Mucus production is also common. A cold is contagious. You can easily spread the virus to others by oral contact. This includes kissing, sharing a glass, coughing, or sneezing. Touching your mouth or nose and then touching a surface, which is then touched by another person, can also spread the virus. SYMPTOMS  Symptoms typically develop 1 to 3 days after you come in contact with a cold virus. Symptoms vary from person to person. They may include:  Runny nose.  Sneezing.  Nasal congestion.  Sinus irritation.  Sore throat.  Loss of voice (laryngitis).  Cough.  Fatigue.  Muscle aches.  Loss of appetite.  Headache.  Low-grade fever. DIAGNOSIS  You might diagnose your own cold based on familiar symptoms, since most people get a cold 2 to 3 times a year. Your caregiver can confirm this based on your exam. Most importantly, your caregiver can check that your symptoms are not due to another disease such as strep throat, sinusitis, pneumonia, asthma, or epiglottitis. Blood tests, throat tests, and X-rays are not necessary to diagnose a common cold, but they may sometimes be helpful in excluding other more serious diseases. Your caregiver will decide if any further tests are required. RISKS AND COMPLICATIONS  You may be at risk for a more severe case of the common cold if you smoke cigarettes, have chronic heart disease (such as heart failure) or lung disease (such as asthma), or if  you have a weakened immune system. The very young and very old are also at risk for more serious infections. Bacterial sinusitis, middle ear infections, and bacterial pneumonia can complicate the common cold. The common cold can worsen asthma and chronic obstructive pulmonary disease (COPD). Sometimes, these complications can require emergency medical care and may be life-threatening. PREVENTION  The best way to protect against getting a cold is to practice good hygiene. Avoid oral or hand contact with people with cold symptoms. Wash your hands often if contact occurs. There is no clear evidence that vitamin C, vitamin E, echinacea, or exercise reduces the chance of developing a cold. However, it is always recommended to get plenty of rest and practice good nutrition. TREATMENT  Treatment is directed at relieving symptoms. There is no cure. Antibiotics are not effective, because the infection is caused by a virus, not by bacteria. Treatment may include:  Increased fluid intake. Sports drinks offer valuable electrolytes, sugars, and fluids.  Breathing heated mist or steam (vaporizer or shower).  Eating chicken soup or other clear broths, and maintaining good nutrition.  Getting plenty of rest.  Using gargles or lozenges for comfort.  Controlling fevers with ibuprofen or acetaminophen as directed by your caregiver.  Increasing usage of your inhaler if you have asthma. Zinc gel and zinc lozenges, taken in the first 24 hours of the common cold, can shorten the duration and lessen the severity of symptoms. Pain medicines may help with fever, muscle aches, and throat pain. A variety of non-prescription medicines are available to treat congestion and runny nose. Your caregiver   can make recommendations and may suggest nasal or lung inhalers for other symptoms.  HOME CARE INSTRUCTIONS   Only take over-the-counter or prescription medicines for pain, discomfort, or fever as directed by your  caregiver.  Use a warm mist humidifier or inhale steam from a shower to increase air moisture. This may keep secretions moist and make it easier to breathe.  Drink enough water and fluids to keep your urine clear or pale yellow.  Rest as needed.  Return to work when your temperature has returned to normal or as your caregiver advises. You may need to stay home longer to avoid infecting others. You can also use a face mask and careful hand washing to prevent spread of the virus. SEEK MEDICAL CARE IF:   After the first few days, you feel you are getting worse rather than better.  You need your caregiver's advice about medicines to control symptoms.  You develop chills, worsening shortness of breath, or brown or red sputum. These may be signs of pneumonia.  You develop yellow or brown nasal discharge or pain in the face, especially when you bend forward. These may be signs of sinusitis.  You develop a fever, swollen neck glands, pain with swallowing, or white areas in the back of your throat. These may be signs of strep throat. SEEK IMMEDIATE MEDICAL CARE IF:   You have a fever.  You develop severe or persistent headache, ear pain, sinus pain, or chest pain.  You develop wheezing, a prolonged cough, cough up blood, or have a change in your usual mucus (if you have chronic lung disease).  You develop sore muscles or a stiff neck. Document Released: 09/02/2000 Document Revised: 06/01/2011 Document Reviewed: 06/14/2013 [facility] Patient Information 2015 [place], [place]. This information is not intended to replace advice given to you by your health care provider. Make sure you discuss any questions you have with your health care provider.

## 2014-12-07 ENCOUNTER — Encounter: Payer: Self-pay | Admitting: Family Medicine

## 2014-12-10 ENCOUNTER — Encounter: Payer: Self-pay | Admitting: Family Medicine

## 2014-12-10 DIAGNOSIS — E785 Hyperlipidemia, unspecified: Secondary | ICD-10-CM

## 2014-12-10 MED ORDER — METFORMIN HCL 500 MG PO TABS: 500.0000 mg | ORAL_TABLET | Freq: Two times a day (BID) | ORAL | Status: DC

## 2014-12-10 MED ORDER — OMEPRAZOLE 20 MG PO CPDR: DELAYED_RELEASE_CAPSULE | ORAL | Status: DC

## 2014-12-10 MED ORDER — LEVOTHYROXINE SODIUM 100 MCG PO TABS: 100.0000 ug | ORAL_TABLET | Freq: Every day | ORAL | Status: DC

## 2014-12-10 MED ORDER — SIMVASTATIN 40 MG PO TABS: 40.0000 mg | ORAL_TABLET | Freq: Every day | ORAL | Status: DC

## 2014-12-10 MED ORDER — HYDROCHLOROTHIAZIDE 12.5 MG PO CAPS: ORAL_CAPSULE | ORAL | Status: DC

## 2014-12-10 NOTE — Telephone Encounter (Signed)
Provider should not have to call directly.  Please call mail order to get more information.

## 2014-12-10 NOTE — Telephone Encounter (Signed)
Spoke to pt who states that Dr Deborra Medina will have to contact the insurance directly. Pts insurance will only allow pt to have 30D supply at once. Pt is stating that he may be leaving his job and is wanting medications for the entire year, so that he will not end up paying out of pocket for meds. He states that the physician has to call directly as it must be a Dr on file to override what they will cover automatically.He is not aware of exactly what medications are needed, and states that Dr Deborra Medina will know which medications are needing to be filled once she has spoken with his insurance company at the telephone number provided.

## 2014-12-10 NOTE — Telephone Encounter (Signed)
Which medications is he needing? His wife?

## 2014-12-11 ENCOUNTER — Ambulatory Visit (INDEPENDENT_AMBULATORY_CARE_PROVIDER_SITE_OTHER): Payer: 59

## 2014-12-11 DIAGNOSIS — Z23 Encounter for immunization: Secondary | ICD-10-CM

## 2015-02-21 ENCOUNTER — Encounter: Payer: Self-pay | Admitting: Family Medicine

## 2015-03-20 ENCOUNTER — Other Ambulatory Visit: Payer: Self-pay | Admitting: Family Medicine

## 2015-03-30 ENCOUNTER — Encounter: Payer: Self-pay | Admitting: Family Medicine

## 2015-04-09 ENCOUNTER — Other Ambulatory Visit: Payer: Self-pay

## 2015-04-09 MED ORDER — LEVOTHYROXINE SODIUM 100 MCG PO TABS: ORAL_TABLET | ORAL | Status: DC

## 2015-04-09 MED ORDER — OMEPRAZOLE 20 MG PO CPDR: DELAYED_RELEASE_CAPSULE | ORAL | Status: DC

## 2015-04-09 NOTE — Telephone Encounter (Signed)
Brian Casey said [facility] did not have levothyroxine refill; I spoke with [facility] and they did not get 02/2015 refill; sending refill to [facility] as requested. [name] voiced understanding.

## 2015-04-09 NOTE — Telephone Encounter (Signed)
Brian Casey(DPR signed) requesting refill omeprazole to [facility]; other refills that are available at [facility] will have transferred to [place]. Omeprazole refill done per protocol and Brian Casey voiced understanding.

## 2015-10-06 ENCOUNTER — Other Ambulatory Visit: Payer: Self-pay | Admitting: Family Medicine

## 2015-11-04 ENCOUNTER — Emergency Department: Payer: Managed Care, Other (non HMO)

## 2015-11-04 ENCOUNTER — Encounter: Payer: Self-pay | Admitting: Emergency Medicine

## 2015-11-04 ENCOUNTER — Inpatient Hospital Stay
Admission: EM | Admit: 2015-11-04 | Discharge: 2015-11-07 | DRG: 871 | Disposition: A | Discharge status: Other Acute Inpatient Hospital | Payer: Managed Care, Other (non HMO) | Attending: Internal Medicine | Admitting: Internal Medicine

## 2015-11-04 ENCOUNTER — Inpatient Hospital Stay (HOSPITAL_COMMUNITY)
Admit: 2015-11-04 | Discharge: 2015-11-04 | Disposition: A | Discharge status: Home / Self Care | Payer: Managed Care, Other (non HMO) | Attending: Internal Medicine | Admitting: Internal Medicine

## 2015-11-04 ENCOUNTER — Inpatient Hospital Stay: Payer: Managed Care, Other (non HMO)

## 2015-11-04 DIAGNOSIS — I10 Essential (primary) hypertension: Secondary | ICD-10-CM | POA: Diagnosis not present

## 2015-11-04 DIAGNOSIS — Z01818 Encounter for other preprocedural examination: Secondary | ICD-10-CM

## 2015-11-04 DIAGNOSIS — Z7984 Long term (current) use of oral hypoglycemic drugs: Secondary | ICD-10-CM

## 2015-11-04 DIAGNOSIS — I700 Atherosclerosis of aorta: Secondary | ICD-10-CM | POA: Diagnosis present

## 2015-11-04 DIAGNOSIS — F321 Major depressive disorder, single episode, moderate: Secondary | ICD-10-CM | POA: Diagnosis present

## 2015-11-04 DIAGNOSIS — N182 Chronic kidney disease, stage 2 (mild): Secondary | ICD-10-CM | POA: Diagnosis present

## 2015-11-04 DIAGNOSIS — R1011 Right upper quadrant pain: Secondary | ICD-10-CM

## 2015-11-04 DIAGNOSIS — F411 Generalized anxiety disorder: Secondary | ICD-10-CM | POA: Diagnosis present

## 2015-11-04 DIAGNOSIS — E860 Dehydration: Secondary | ICD-10-CM | POA: Diagnosis present

## 2015-11-04 DIAGNOSIS — F1010 Alcohol abuse, uncomplicated: Secondary | ICD-10-CM | POA: Diagnosis present

## 2015-11-04 DIAGNOSIS — K819 Cholecystitis, unspecified: Secondary | ICD-10-CM

## 2015-11-04 DIAGNOSIS — K810 Acute cholecystitis: Secondary | ICD-10-CM | POA: Diagnosis present

## 2015-11-04 DIAGNOSIS — E1159 Type 2 diabetes mellitus with other circulatory complications: Secondary | ICD-10-CM

## 2015-11-04 DIAGNOSIS — I25111 Atherosclerotic heart disease of native coronary artery with angina pectoris with documented spasm: Secondary | ICD-10-CM | POA: Diagnosis not present

## 2015-11-04 DIAGNOSIS — I4891 Unspecified atrial fibrillation: Secondary | ICD-10-CM

## 2015-11-04 DIAGNOSIS — I214 Non-ST elevation (NSTEMI) myocardial infarction: Secondary | ICD-10-CM | POA: Diagnosis present

## 2015-11-04 DIAGNOSIS — F17200 Nicotine dependence, unspecified, uncomplicated: Secondary | ICD-10-CM | POA: Diagnosis present

## 2015-11-04 DIAGNOSIS — N179 Acute kidney failure, unspecified: Secondary | ICD-10-CM | POA: Diagnosis present

## 2015-11-04 DIAGNOSIS — E1165 Type 2 diabetes mellitus with hyperglycemia: Secondary | ICD-10-CM

## 2015-11-04 DIAGNOSIS — E7800 Pure hypercholesterolemia, unspecified: Secondary | ICD-10-CM | POA: Diagnosis present

## 2015-11-04 DIAGNOSIS — E1122 Type 2 diabetes mellitus with diabetic chronic kidney disease: Secondary | ICD-10-CM | POA: Diagnosis present

## 2015-11-04 DIAGNOSIS — I779 Disorder of arteries and arterioles, unspecified: Secondary | ICD-10-CM | POA: Diagnosis not present

## 2015-11-04 DIAGNOSIS — E890 Postprocedural hypothyroidism: Secondary | ICD-10-CM | POA: Diagnosis present

## 2015-11-04 DIAGNOSIS — I130 Hypertensive heart and chronic kidney disease with heart failure and stage 1 through stage 4 chronic kidney disease, or unspecified chronic kidney disease: Secondary | ICD-10-CM | POA: Diagnosis present

## 2015-11-04 DIAGNOSIS — F4321 Adjustment disorder with depressed mood: Secondary | ICD-10-CM

## 2015-11-04 DIAGNOSIS — F17210 Nicotine dependence, cigarettes, uncomplicated: Secondary | ICD-10-CM | POA: Diagnosis present

## 2015-11-04 DIAGNOSIS — A419 Sepsis, unspecified organism: Secondary | ICD-10-CM | POA: Diagnosis present

## 2015-11-04 DIAGNOSIS — I6522 Occlusion and stenosis of left carotid artery: Secondary | ICD-10-CM | POA: Diagnosis not present

## 2015-11-04 DIAGNOSIS — I480 Paroxysmal atrial fibrillation: Secondary | ICD-10-CM | POA: Diagnosis present

## 2015-11-04 DIAGNOSIS — E039 Hypothyroidism, unspecified: Secondary | ICD-10-CM | POA: Diagnosis present

## 2015-11-04 DIAGNOSIS — K219 Gastro-esophageal reflux disease without esophagitis: Secondary | ICD-10-CM | POA: Diagnosis present

## 2015-11-04 DIAGNOSIS — I5030 Unspecified diastolic (congestive) heart failure: Secondary | ICD-10-CM | POA: Diagnosis present

## 2015-11-04 DIAGNOSIS — I25119 Atherosclerotic heart disease of native coronary artery with unspecified angina pectoris: Secondary | ICD-10-CM

## 2015-11-04 DIAGNOSIS — R778 Other specified abnormalities of plasma proteins: Secondary | ICD-10-CM

## 2015-11-04 DIAGNOSIS — E871 Hypo-osmolality and hyponatremia: Secondary | ICD-10-CM | POA: Diagnosis present

## 2015-11-04 DIAGNOSIS — A4150 Gram-negative sepsis, unspecified: Secondary | ICD-10-CM | POA: Diagnosis not present

## 2015-11-04 DIAGNOSIS — E785 Hyperlipidemia, unspecified: Secondary | ICD-10-CM | POA: Diagnosis not present

## 2015-11-04 DIAGNOSIS — IMO0002 Reserved for concepts with insufficient information to code with codable children: Secondary | ICD-10-CM

## 2015-11-04 DIAGNOSIS — Z79899 Other long term (current) drug therapy: Secondary | ICD-10-CM | POA: Diagnosis not present

## 2015-11-04 DIAGNOSIS — R7989 Other specified abnormal findings of blood chemistry: Secondary | ICD-10-CM | POA: Insufficient documentation

## 2015-11-04 DIAGNOSIS — I25110 Atherosclerotic heart disease of native coronary artery with unstable angina pectoris: Secondary | ICD-10-CM | POA: Diagnosis not present

## 2015-11-04 DIAGNOSIS — R9439 Abnormal result of other cardiovascular function study: Secondary | ICD-10-CM

## 2015-11-04 DIAGNOSIS — Z01810 Encounter for preprocedural cardiovascular examination: Secondary | ICD-10-CM | POA: Diagnosis not present

## 2015-11-04 LAB — CBC WITH DIFFERENTIAL/PLATELET
BASOS PCT: 0 %
Basophils Absolute: 0.1 10*3/uL (ref 0–0.1)
EOS ABS: 0 10*3/uL (ref 0–0.7)
EOS PCT: 0 %
HCT: 40.9 % (ref 40.0–52.0)
Hemoglobin: 14.6 g/dL (ref 13.0–18.0)
Lymphocytes Relative: 2 %
Lymphs Abs: 0.5 10*3/uL — ABNORMAL LOW (ref 1.0–3.6)
MCH: 34.2 pg — ABNORMAL HIGH (ref 26.0–34.0)
MCHC: 35.6 g/dL (ref 32.0–36.0)
MCV: 96.2 fL (ref 80.0–100.0)
MONO ABS: 1 10*3/uL (ref 0.2–1.0)
MONOS PCT: 4 %
Neutro Abs: 22.6 10*3/uL — ABNORMAL HIGH (ref 1.4–6.5)
Neutrophils Relative %: 94 %
Platelets: 176 10*3/uL (ref 150–440)
RBC: 4.26 MIL/uL — ABNORMAL LOW (ref 4.40–5.90)
RDW: 12.9 % (ref 11.5–14.5)
WBC: 24.2 10*3/uL — ABNORMAL HIGH (ref 3.8–10.6)

## 2015-11-04 LAB — COMPREHENSIVE METABOLIC PANEL
ALBUMIN: 3.5 g/dL (ref 3.5–5.0)
ALT: 22 U/L (ref 17–63)
ANION GAP: 12 (ref 5–15)
AST: 28 U/L (ref 15–41)
Alkaline Phosphatase: 55 U/L (ref 38–126)
BILIRUBIN TOTAL: 1.8 mg/dL — AB (ref 0.3–1.2)
BUN: 31 mg/dL — ABNORMAL HIGH (ref 6–20)
CO2: 22 mmol/L (ref 22–32)
Calcium: 8.7 mg/dL — ABNORMAL LOW (ref 8.9–10.3)
Chloride: 87 mmol/L — ABNORMAL LOW (ref 101–111)
Creatinine, Ser: 2.4 mg/dL — ABNORMAL HIGH (ref 0.61–1.24)
GFR calc Af Amer: 31 mL/min — ABNORMAL LOW (ref >60)
GFR calc non Af Amer: 27 mL/min — ABNORMAL LOW (ref >60)
GLUCOSE: 209 mg/dL — AB (ref 65–99)
POTASSIUM: 4.8 mmol/L (ref 3.5–5.1)
SODIUM: 121 mmol/L — AB (ref 135–145)
TOTAL PROTEIN: 6.9 g/dL (ref 6.5–8.1)

## 2015-11-04 LAB — TROPONIN I
Troponin I: 0.27 ng/mL (ref <0.03)
Troponin I: 0.04 ng/mL (ref <0.03)

## 2015-11-04 LAB — APTT: aPTT: 27 seconds (ref 24–36)

## 2015-11-04 LAB — GLUCOSE, CAPILLARY
GLUCOSE-CAPILLARY: 131 mg/dL — AB (ref 65–99)
Glucose-Capillary: 114 mg/dL — ABNORMAL HIGH (ref 65–99)

## 2015-11-04 LAB — LIPASE, BLOOD: Lipase: 18 U/L (ref 11–51)

## 2015-11-04 LAB — T4, FREE: FREE T4: 1.09 ng/dL (ref 0.61–1.12)

## 2015-11-04 LAB — HEPARIN LEVEL (UNFRACTIONATED)

## 2015-11-04 LAB — HEMOGLOBIN A1C: HEMOGLOBIN A1C: 6.4 % — AB (ref 4.0–6.0)

## 2015-11-04 LAB — PROTIME-INR
INR: 1.14
Prothrombin Time: 14.7 seconds (ref 11.4–15.2)

## 2015-11-04 LAB — TSH: TSH: 4.319 u[IU]/mL (ref 0.350–4.500)

## 2015-11-04 LAB — URIC ACID: Uric Acid, Serum: 4 mg/dL — ABNORMAL LOW (ref 4.4–7.6)

## 2015-11-04 LAB — MAGNESIUM: Magnesium: 1.9 mg/dL (ref 1.7–2.4)

## 2015-11-04 MED ORDER — TRAMADOL HCL 50 MG PO TABS: 50.0000 mg | ORAL_TABLET | Freq: Four times a day (QID) | ORAL | Status: DC | PRN

## 2015-11-04 MED ORDER — HYDROCODONE-HOMATROPINE 5-1.5 MG/5ML PO SYRP: 5.0000 mL | ORAL_SOLUTION | Freq: Three times a day (TID) | ORAL | Status: DC | PRN

## 2015-11-04 MED ORDER — ONDANSETRON HCL 4 MG PO TABS: 4.0000 mg | ORAL_TABLET | Freq: Four times a day (QID) | ORAL | Status: DC | PRN

## 2015-11-04 MED ORDER — SODIUM CHLORIDE 0.9 % IV SOLN
1000.0000 mL | Freq: Once | INTRAVENOUS | Status: AC
  Administered 2015-11-04: 1000 mL via INTRAVENOUS
  Filled 2015-11-04: qty 1000

## 2015-11-04 MED ORDER — SODIUM CHLORIDE 0.9 % IV SOLN
INTRAVENOUS | Status: DC
  Administered 2015-11-04 – 2015-11-06 (×6): via INTRAVENOUS

## 2015-11-04 MED ORDER — HEPARIN BOLUS VIA INFUSION: 4000.0000 [IU] | Freq: Once | INTRAVENOUS | Status: DC

## 2015-11-04 MED ORDER — DEXTROSE 5 % IV SOLN
2.0000 g | Freq: Once | INTRAVENOUS | Status: AC
  Administered 2015-11-04: 2 g via INTRAVENOUS
  Filled 2015-11-04: qty 2

## 2015-11-04 MED ORDER — PIPERACILLIN-TAZOBACTAM 3.375 G IVPB
3.3750 g | Freq: Three times a day (TID) | INTRAVENOUS | Status: DC
  Administered 2015-11-04 – 2015-11-07 (×8): 3.375 g via INTRAVENOUS
  Filled 2015-11-04 (×11): qty 50

## 2015-11-04 MED ORDER — LORAZEPAM 2 MG PO TABS: 0.0000 mg | ORAL_TABLET | Freq: Two times a day (BID) | ORAL | Status: DC

## 2015-11-04 MED ORDER — LEVOTHYROXINE SODIUM 100 MCG PO TABS
100.0000 ug | ORAL_TABLET | Freq: Every day | ORAL | Status: DC
  Administered 2015-11-05 – 2015-11-07 (×2): 100 ug via ORAL
  Filled 2015-11-04: qty 2
  Filled 2015-11-04: qty 1

## 2015-11-04 MED ORDER — HEPARIN (PORCINE) IN NACL 100-0.45 UNIT/ML-% IJ SOLN: 12.0000 [IU]/kg/h | Freq: Once | INTRAMUSCULAR | Status: DC

## 2015-11-04 MED ORDER — LORAZEPAM 2 MG PO TABS: 0.0000 mg | ORAL_TABLET | Freq: Four times a day (QID) | ORAL | Status: AC

## 2015-11-04 MED ORDER — INSULIN ASPART 100 UNIT/ML ~~LOC~~ SOLN
0.0000 [IU] | Freq: Three times a day (TID) | SUBCUTANEOUS | Status: DC
Start: 2015-11-04 — End: 2015-11-07
  Administered 2015-11-05: 2 [IU] via SUBCUTANEOUS
  Administered 2015-11-05: 1 [IU] via SUBCUTANEOUS
  Administered 2015-11-05 – 2015-11-06 (×2): 2 [IU] via SUBCUTANEOUS
  Administered 2015-11-07: 1 [IU] via SUBCUTANEOUS
  Administered 2015-11-07: 2 [IU] via SUBCUTANEOUS
  Filled 2015-11-04 (×2): qty 2
  Filled 2015-11-04: qty 1
  Filled 2015-11-04: qty 2
  Filled 2015-11-04: qty 1
  Filled 2015-11-04: qty 2

## 2015-11-04 MED ORDER — HEPARIN (PORCINE) IN NACL 100-0.45 UNIT/ML-% IJ SOLN
1800.0000 [IU]/h | INTRAMUSCULAR | Status: DC
  Administered 2015-11-04: 1100 [IU]/h via INTRAVENOUS
  Administered 2015-11-05: 1800 [IU]/h via INTRAVENOUS
  Administered 2015-11-05: 1400 [IU]/h via INTRAVENOUS
  Filled 2015-11-04 (×6): qty 250

## 2015-11-04 MED ORDER — KETOROLAC TROMETHAMINE 15 MG/ML IJ SOLN: 15.0000 mg | Freq: Four times a day (QID) | INTRAMUSCULAR | Status: DC | PRN

## 2015-11-04 MED ORDER — SIMVASTATIN 40 MG PO TABS
40.0000 mg | ORAL_TABLET | Freq: Every day | ORAL | Status: DC
  Administered 2015-11-04: 40 mg via ORAL
  Filled 2015-11-04: qty 1

## 2015-11-04 MED ORDER — PIPERACILLIN-TAZOBACTAM 3.375 G IVPB
3.3750 g | Freq: Once | INTRAVENOUS | Status: DC
  Filled 2015-11-04: qty 50

## 2015-11-04 MED ORDER — HEPARIN SODIUM (PORCINE) 5000 UNIT/ML IJ SOLN
INTRAMUSCULAR | Status: AC
  Filled 2015-11-04: qty 1

## 2015-11-04 MED ORDER — SENNOSIDES-DOCUSATE SODIUM 8.6-50 MG PO TABS: 1.0000 | ORAL_TABLET | Freq: Every evening | ORAL | Status: DC | PRN

## 2015-11-04 MED ORDER — METOPROLOL TARTRATE 5 MG/5ML IV SOLN
5.0000 mg | Freq: Once | INTRAVENOUS | Status: AC
  Administered 2015-11-04: 5 mg via INTRAVENOUS
  Filled 2015-11-04: qty 5

## 2015-11-04 MED ORDER — ONDANSETRON HCL 4 MG/2ML IJ SOLN
INTRAMUSCULAR | Status: AC
  Administered 2015-11-04: 4 mg via INTRAVENOUS
  Filled 2015-11-04: qty 2

## 2015-11-04 MED ORDER — THIAMINE HCL 100 MG/ML IJ SOLN: 100.0000 mg | Freq: Every day | INTRAMUSCULAR | Status: DC

## 2015-11-04 MED ORDER — ADULT MULTIVITAMIN W/MINERALS CH
1.0000 | ORAL_TABLET | Freq: Every day | ORAL | Status: DC
  Administered 2015-11-04 – 2015-11-07 (×3): 1 via ORAL
  Filled 2015-11-04 (×3): qty 1

## 2015-11-04 MED ORDER — ASPIRIN 81 MG PO CHEW
324.0000 mg | CHEWABLE_TABLET | Freq: Once | ORAL | Status: AC
  Administered 2015-11-04: 324 mg via ORAL
  Filled 2015-11-04: qty 4

## 2015-11-04 MED ORDER — ONDANSETRON HCL 4 MG/2ML IJ SOLN
4.0000 mg | Freq: Once | INTRAMUSCULAR | Status: AC
  Administered 2015-11-04: 4 mg via INTRAVENOUS

## 2015-11-04 MED ORDER — MORPHINE SULFATE (PF) 4 MG/ML IV SOLN
4.0000 mg | Freq: Once | INTRAVENOUS | Status: AC
  Administered 2015-11-04: 4 mg via INTRAVENOUS
  Filled 2015-11-04: qty 1

## 2015-11-04 MED ORDER — VANCOMYCIN HCL IN DEXTROSE 1-5 GM/200ML-% IV SOLN
1000.0000 mg | Freq: Once | INTRAVENOUS | Status: AC
  Administered 2015-11-04: 1000 mg via INTRAVENOUS
  Filled 2015-11-04: qty 200

## 2015-11-04 MED ORDER — HEPARIN SODIUM (PORCINE) 5000 UNIT/ML IJ SOLN: 5000.0000 [IU] | Freq: Three times a day (TID) | INTRAMUSCULAR | Status: DC

## 2015-11-04 MED ORDER — DIATRIZOATE MEGLUMINE & SODIUM 66-10 % PO SOLN
15.0000 mL | Freq: Once | ORAL | Status: AC
  Administered 2015-11-04: 15 mL via ORAL

## 2015-11-04 MED ORDER — DILTIAZEM HCL 25 MG/5ML IV SOLN
10.0000 mg | Freq: Once | INTRAVENOUS | Status: AC
  Administered 2015-11-04: 10 mg via INTRAVENOUS
  Filled 2015-11-04: qty 5

## 2015-11-04 MED ORDER — LORAZEPAM 2 MG/ML IJ SOLN: 1.0000 mg | Freq: Four times a day (QID) | INTRAMUSCULAR | Status: AC | PRN

## 2015-11-04 MED ORDER — PANTOPRAZOLE SODIUM 40 MG PO TBEC
40.0000 mg | DELAYED_RELEASE_TABLET | Freq: Every day | ORAL | Status: DC
  Administered 2015-11-04 – 2015-11-07 (×3): 40 mg via ORAL
  Filled 2015-11-04 (×3): qty 1

## 2015-11-04 MED ORDER — ACETAMINOPHEN 650 MG RE SUPP: 650.0000 mg | Freq: Four times a day (QID) | RECTAL | Status: DC | PRN

## 2015-11-04 MED ORDER — ONDANSETRON HCL 4 MG/2ML IJ SOLN: 4.0000 mg | Freq: Four times a day (QID) | INTRAMUSCULAR | Status: DC | PRN

## 2015-11-04 MED ORDER — ONDANSETRON HCL 4 MG/2ML IJ SOLN
4.0000 mg | Freq: Once | INTRAMUSCULAR | Status: AC
  Administered 2015-11-04: 4 mg via INTRAVENOUS
  Filled 2015-11-04: qty 2

## 2015-11-04 MED ORDER — VITAMIN B-1 100 MG PO TABS
100.0000 mg | ORAL_TABLET | Freq: Every day | ORAL | Status: DC
  Administered 2015-11-04 – 2015-11-07 (×3): 100 mg via ORAL
  Filled 2015-11-04 (×3): qty 1

## 2015-11-04 MED ORDER — LORAZEPAM 1 MG PO TABS: 1.0000 mg | ORAL_TABLET | Freq: Four times a day (QID) | ORAL | Status: AC | PRN

## 2015-11-04 MED ORDER — FOLIC ACID 1 MG PO TABS
1.0000 mg | ORAL_TABLET | Freq: Every day | ORAL | Status: DC
  Administered 2015-11-04 – 2015-11-07 (×3): 1 mg via ORAL
  Filled 2015-11-04 (×3): qty 1

## 2015-11-04 MED ORDER — SODIUM CHLORIDE 0.9% FLUSH
3.0000 mL | Freq: Two times a day (BID) | INTRAVENOUS | Status: DC
  Administered 2015-11-04 – 2015-11-07 (×6): 3 mL via INTRAVENOUS

## 2015-11-04 MED ORDER — INSULIN ASPART 100 UNIT/ML ~~LOC~~ SOLN: 0.0000 [IU] | Freq: Every day | SUBCUTANEOUS | Status: DC

## 2015-11-04 MED ORDER — ACETAMINOPHEN 325 MG PO TABS
650.0000 mg | ORAL_TABLET | Freq: Four times a day (QID) | ORAL | Status: DC | PRN
  Administered 2015-11-05 – 2015-11-07 (×4): 650 mg via ORAL
  Filled 2015-11-04 (×5): qty 2

## 2015-11-04 MED ORDER — DEXTROSE 5 % IV SOLN
5.0000 mg/h | INTRAVENOUS | Status: DC
  Administered 2015-11-04: 5 mg/h via INTRAVENOUS
  Administered 2015-11-05: 14 mg/h via INTRAVENOUS
  Filled 2015-11-04 (×2): qty 100

## 2015-11-04 MED ORDER — HEPARIN BOLUS VIA INFUSION
2600.0000 [IU] | Freq: Once | INTRAVENOUS | Status: AC
  Administered 2015-11-04: 2600 [IU] via INTRAVENOUS
  Filled 2015-11-04: qty 2600

## 2015-11-04 NOTE — Progress Notes (Signed)
Inpatient Diabetes Program Recommendations  AACE/ADA: New Consensus Statement on Inpatient Glycemic Control (2015)  Target Ranges:  Prepandial:   less than 140 mg/dL      Peak postprandial:   less than 180 mg/dL (1-2 hours)      Critically ill patients:  140 - 180 mg/dL  Results for ELISHUA, RADFORD (MRN [id number]) as of 11/04/2015 12:40  Ref. Range 11/04/2015 08:41  Glucose Latest Ref Range: 65 - 99 mg/dL 209 (H)    Review of Glycemic Control  Diabetes history: DM2 Outpatient Diabetes medications: Metformin 500 mg BID Current orders for Inpatient glycemic control: Novolog 0-9 units TID with meals, Novolog 0-5 units QHS  Inpatient Diabetes Program Recommendations: HgbA1C: Last A1C in the chart was 6.5% on 10/30/2014. Note current A1C is ordered.  Note: Consult for inpatient diabetes coordinator noted and chart reviewed. Agree with current inpatient glycemic control orders. Will continue to follow along.  Thanks, Barnie Alderman, RN, MSN, CDE Diabetes Coordinator Inpatient Diabetes Program [phone] (Team Pager from [place] to [place]) [phone] ([facility] office) [phone] [facility] office) [phone] [facility] office)

## 2015-11-04 NOTE — ED Triage Notes (Addendum)
Patient presents to the ED with right lower quadrant pain radiating into right upper quadrant and centralized abdominal pain.  Patient states he feels very, "sore, and tender".  Patient states pain started on Saturday.  Patient reports vomiting x 1 after taking milk of magnesia.  Patient reports history of hernia operation in the past.  Patient reports feeling pain every time he moves or changes positions.  Patient reports being unable to eat the past couple of days.  Reports feeling weak and tired.  Patient denies diarrhea.  Patient states he normally drinks heavily daily but hasn't had much to drink since Friday night.  "a couple sips of a mixed drink".

## 2015-11-04 NOTE — H&P (Signed)
[facility] at [place] NAME: Brian Casey    MR#:  [id number]  DATE OF BIRTH:  03/02/1951  DATE OF ADMISSION:  11/04/2015  PRIMARY CARE PHYSICIAN: [name], MD   REQUESTING/REFERRING PHYSICIAN: Dr Jimmye Norman  CHIEF COMPLAINT:    Abdominal pain HISTORY OF PRESENT ILLNESS:  Brian Casey  is a 65 y.o. male with a known history of hypothyroid, DM,Chronic EtOH abuse who presents with above complaint. Patient reports for the past 3 days he's had right upper quadrant abdominal pain without fever or chills. He has reported decreased appetite. His last drink was Friday due to the abdominal pain. He has not gone through EtOH withdrawal. In the emergency room CT scan is suggestive for acute cholecystitis. Abdominal ultrasound is pending. He was found to have hyponatremia and acute renal failure as well as new onset atrial fibrillation.  PAST MEDICAL HISTORY:   Past Medical History:  Diagnosis Date  . GERD (gastroesophageal reflux disease)   . High cholesterol   . Hypertension 10/17/2013  . Hypothyroidism   . Thyroid disease   . Type II diabetes mellitus ([place])     PAST SURGICAL HISTORY:   Past Surgical History:  Procedure Laterality Date  . COLONOSCOPY    . CYST REMOVAL HAND Left finger  . hemmorhoidectomy  1982  . INSERTION OF MESH N/A 11/16/2013   Procedure: INSERTION OF MESH;  Surgeon: Imogene Burn. Georgette Dover, MD;  Location: [facility];  Service: General;  Laterality: N/A;  . UMBILICAL HERNIA REPAIR N/A 11/16/2013   Procedure: UMBILICAL HERNIA REPAIR;  Surgeon: Imogene Burn. Georgette Dover, MD;  Location: [place];  Service: General;  Laterality: N/A;    SOCIAL HISTORY:   Social History  Substance Use Topics  . Smoking status: Current Some Day Smoker    Packs/day: 0.13    Years: 30.00    Types: Cigarettes  . Smokeless tobacco: Never Used  . Alcohol use 8.4 oz/week    7 Cans of beer, 7 Shots of liquor per week    FAMILY HISTORY:   Family History   Problem Relation Age of Onset  . Cancer Mother     colon  . Hypertension Mother   . Diabetes Mother   . Depression Mother   . Colon cancer Mother   . Hyperthyroidism Father   . Hypertension Father   . Cancer Father     prostate  . Diabetes Maternal Grandmother   . Cancer Maternal Grandmother     lung, non smoker    DRUG ALLERGIES:   Allergies  Allergen Reactions  . Bupropion Hcl     REACTION: Severe constipation, insomnia  . Penicillins     REACTION: Vomiting    REVIEW OF SYSTEMS:   Review of Systems  Constitutional: Negative.  Negative for chills, fever and malaise/fatigue.  HENT: Negative.  Negative for ear discharge, ear pain, hearing loss, nosebleeds and sore throat.   Eyes: Negative.  Negative for blurred vision and pain.  Respiratory: Negative.  Negative for cough, hemoptysis, shortness of breath and wheezing.   Cardiovascular: Negative.  Negative for chest pain, palpitations and leg swelling.  Gastrointestinal: Positive for abdominal pain and constipation. Negative for blood in stool, diarrhea, nausea and vomiting.  Genitourinary: Negative.  Negative for dysuria.  Musculoskeletal: Negative.  Negative for back pain.  Skin: Negative.   Neurological: Negative for dizziness, tremors, speech change, focal weakness, seizures and headaches.  Endo/Heme/Allergies: Negative.  Does not bruise/bleed easily.  Psychiatric/Behavioral: Negative.  Negative for  depression, hallucinations and suicidal ideas.    MEDICATIONS AT HOME:   Prior to Admission medications   Medication Sig Start Date End Date Taking? Authorizing Provider  ALPRAZolam (XANAX) 0.25 MG tablet Take 1 tablet (0.25 mg total) by mouth 2 (two) times daily as needed. 11/06/14   Lucille Passy, MD  azithromycin (ZITHROMAX) 250 MG tablet Take 2 tabs today, then 1 tab daily x 4 days 12/03/14   Jearld Fenton, NP  Blood Glucose Monitoring Suppl ([place]) W/DEVICE KIT Use directed- may check blood sugars as  needed and up to 3 times daily 04/17/14   Lucille Passy, MD  glucose blood test strip Use as instructed 04/18/14   Lucille Passy, MD  hydrochlorothiazide (MICROZIDE) 12.5 MG capsule TAKE 1 CAPSULE BY MOUTH DAILY 03/20/15   Lucille Passy, MD  HYDROcodone-homatropine [facility]) 5-1.5 MG/5ML syrup Take 5 mLs by mouth every 8 (eight) hours as needed for cough. 12/03/14   Jearld Fenton, NP  levothyroxine (SYNTHROID, LEVOTHROID) 100 MCG tablet Take 1 tablet (100 mcg total) by mouth daily before breakfast. OFFICE VISIT WITH  LABS REQUIRED FOR ADDITIONAL REFILLS 10/07/15   Lucille Passy, MD  metFORMIN (GLUCOPHAGE) 500 MG tablet TAKE 1 TABLET BY MOUTH TWICE DAILY WITH A MEAL 03/20/15   Lucille Passy, MD  omeprazole (PRILOSEC) 20 MG capsule Take 1 capsule (20 mg total) by mouth daily. OFFICE VISIT REQUIRED FOR ADDITIONAL REFILLS 10/07/15   Lucille Passy, MD  simvastatin (ZOCOR) 40 MG tablet Take 1 tablet (40 mg total) by mouth at bedtime. 12/10/14   Lucille Passy, MD      VITAL SIGNS:  Blood pressure (!) 103/49, pulse (!) 170, temperature 97.6 F (36.4 C), temperature source Oral, resp. rate 18, height _0  (1.702 m), weight 95.3 kg (210 lb), SpO2 97 %.  PHYSICAL EXAMINATION:   Physical Exam  Constitutional: He is oriented to person, place, and time and well-developed, well-nourished, and in no distress. No distress.  HENT:  Head: Normocephalic.  Eyes: No scleral icterus.  Neck: Normal range of motion. Neck supple. No JVD present. No tracheal deviation present.  Cardiovascular: Normal rate, regular rhythm and normal heart sounds.  Exam reveals no gallop and no friction rub.   No murmur heard. Irr, irr tachy  Pulmonary/Chest: Effort normal and breath sounds normal. No respiratory distress. He has no wheezes. He has no rales. He exhibits no tenderness.  Abdominal: Soft. Bowel sounds are normal. He exhibits no distension and no mass. There is tenderness (ruq). There is no rebound and no guarding.   Musculoskeletal: Normal range of motion. He exhibits no edema.  Neurological: He is alert and oriented to person, place, and time.  Skin: Skin is warm. No rash noted. No erythema.  Psychiatric: Affect and judgment normal.      LABORATORY PANEL:   CBC  Recent Labs Lab 11/04/15 0841  WBC 24.2*  HGB 14.6  HCT 40.9  PLT 176   ------------------------------------------------------------------------------------------------------------------  Chemistries   Recent Labs Lab 11/04/15 0841 11/04/15 0842  NA 121*  --   K 4.8  --   CL 87*  --   CO2 22  --   GLUCOSE 209*  --   BUN 31*  --   CREATININE 2.40*  --   CALCIUM 8.7*  --   MG  --  1.9  AST 28  --   ALT 22  --   ALKPHOS 55  --   BILITOT  1.8*  --    ------------------------------------------------------------------------------------------------------------------  Cardiac Enzymes  Recent Labs Lab 11/04/15 0841  TROPONINI 0.27*   ------------------------------------------------------------------------------------------------------------------  RADIOLOGY:  Ct Abdomen Pelvis Wo Contrast  Result Date: 11/04/2015 CLINICAL DATA:  Right lower quadrant pain extending into the right upper and central abdomen. Symptoms for 2 days. History of hernia repair. EXAM: CT ABDOMEN AND PELVIS WITHOUT CONTRAST TECHNIQUE: Multidetector CT imaging of the abdomen and pelvis was performed following the standard protocol without IV contrast. COMPARISON:  None. FINDINGS: Lower chest: Mild dependent airspace opacities at both lung bases, probably atelectasis. No significant pleural or pericardial effusion. Prominent coronary artery atherosclerosis noted. There is a small hiatal hernia. Hepatobiliary: The hepatic density is diffusely decreased, consistent with steatosis. No focal hepatic abnormalities are identified on noncontrast imaging. The gallbladder is distended with wall thickening and prominent surrounding inflammatory changes. No  calcified gallstones are seen. There is no significant biliary dilatation. Pancreas: Unremarkable. No pancreatic ductal dilatation or surrounding inflammatory changes. Spleen: Normal in size without focal abnormality. Adrenals/Urinary Tract: Both adrenal glands appear normal. Both kidneys demonstrate mild cortical thinning and symmetric perinephric soft tissue stranding. 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 No evidence of renal mass, hydronephrosis or urinary tract calculus. The bladder appears unremarkable. Stomach/Bowel: No evidence bowel wall thickening or distention. The right upper quadrant inflammatory changes do not appear to arise from the duodenum. The appendix appears normal. There is mild distal colonic diverticulosis. Vascular/Lymphatic: There are no enlarged abdominal or pelvic lymph nodes. Aortic and branch vessel atherosclerosis noted. Vascular assessment limited without contrast. Reproductive: There is a focal parenchymal calcification within the right aspect of the prostate gland which is not significantly enlarged. The seminal vesicles appear normal. Other: No evidence of abdominal wall mass or hernia. Musculoskeletal: No acute or significant osseous findings. IMPRESSION: 1. Distended gallbladder with wall thickening and surrounding inflammation highly suspicious for acute cholecystitis. Correlate clinically. No calcified gallstones or biliary dilatation identified. 2. No other acute findings are seen.  The appendix appears normal. 3. Bibasilar atelectasis, hepatic steatosis and mild distal colonic diverticulosis. 4.  Aortic Atherosclerosis (ICD10-170.0) Electronically Signed   By: Richardean Sale M.D.   On: 11/04/2015 10:17    EKG:  Atrial fibrillation heart rate 169 with LAD  IMPRESSION AND PLAN:   65 year old male with history of diabetes and hypothyroidism status post radioactive iodine for hyperthyroidism and chronic EtOH abuse who presents with  right upper quadrant abdominal pain and found to have acute cholecystitis, new onset atrial fibrillation, hyponatremia and acute renal failure in addition to elevated troponin.   1. Sepsis due to Acute cholecystitis: Patient presents with tachycardia and leukocytosis . Follow-up on abdominal ultrasound. Continue Zosyn. Follow-up on surgical recommendations, at some point will need a cholecystectomy.  2. New onset atrial fibrillation: Check TSH and echo. Cardiology consultation. Start diltiazem drip for goal heart rate less than 100.  3. Hyponatremia: This is likely due to poor by mouth intake over the past several days in addition to EtOH abuse and HCTZ use. Start IV fluids and repeat sodium level later today. Do not correct sodium level >8-10 mEq/24 hour. Obtain nephrology consultation. Check TSH and uric acid level. 4. Acute renal failure in the setting of poor by mouth intake and HCTZ and metformin use. Renal ultrasound. IV fluids Nephrology consult. Check urine sodium level.   5. EtOH abuse: Start CIWA protocol. Monitor for EtOH withdrawal.  6. Tobacco dependence: Patient reports she smokes occasionally. I have  discussed with the patient he should stop smoking. Patient counseled 3 minutes.  7. Diabetes: Start sliding scale insulin and hold metformin.  8. Elevated troponin in the setting of sepsis/acute cholecystitis and atrial fibrillation: This is likely due to demand ischemia. Follow troponins and cardiology consult.. Follow up on echo cardiac.    All the records are reviewed and case discussed with ED provider. Management plans discussed with the patient and he in agreement  CODE STATUS: full  TOTAL TIME TAKING CARE OF THIS PATIENT: 50 minutes.    Saliah Crisp M.D on 11/04/2015 at 11:36 AM  Between 7am to 6pm - Pager - [phone]  After 6pm go to [email or web address] - password [email or web address] [facility]  Office  [phone]  CC: Primary care  physician; [name], MD

## 2015-11-04 NOTE — Progress Notes (Addendum)
ANTICOAGULATION CONSULT NOTE - Initial Consult  Pharmacy Consult for Heparin drip Indication: chest pain/ACS and atrial fibrillation  Allergies  Allergen Reactions  . Bupropion Hcl     REACTION: Severe constipation, insomnia  . Penicillins Other (See Comments)    REACTION: Vomiting Has patient had a PCN reaction causing immediate rash, facial/tongue/throat swelling, SOB or lightheadedness with hypotension: Yes Has patient had a PCN reaction causing severe rash involving mucus membranes or skin necrosis: No Has patient had a PCN reaction that required hospitalization No Has patient had a PCN reaction occurring within the last 10 years: No If all of the above answers are "NO", then may proceed with Cephalosporin use.    Patient Measurements: Height: _0  (170.2 cm) Weight: 207 lb 6.4 oz (94.1 kg) IBW/kg (Calculated) : 66.1 Heparin Dosing Weight: 86.4 kg  Vital Signs: Temp: 99.1 F (37.3 C) (08/14 2035) Temp Source: Oral (08/14 2035) BP: 140/55 (08/14 2035) Pulse Rate: 77 (08/14 2035)  Labs:  Recent Labs  11/04/15 0841 11/04/15 1257 11/04/15 1934  HGB 14.6  --   --   HCT 40.9  --   --   PLT 176  --   --   APTT 27  --   --   LABPROT 14.7  --   --   INR 1.14  --   --   HEPARINUNFRC  --   --  <0.10*  CREATININE 2.40*  --   --   TROPONINI 0.27* 0.04*  --     Estimated Creatinine Clearance: 34.4 mL/min (by C-G formula based on SCr of 2.4 mg/dL).    Assessment: 65 yo male with elevated troponin and AFib with RVR. No anticoagulants noted in prior to admission med list. Spoke with ED MD, would NOT like a heparin bolus at this time as pt may need surgery; would just like the maintenance drip started.   Hgb 14.6, Plt 176 APTT and INR pending  Goal of Therapy:  Heparin level 0.3-0.7 units/ml Monitor platelets by anticoagulation protocol: Yes   Plan:  Heparin drip at 1100 units/hr (=11 ml/hr) Heparin level in 6h at 1800  CBC in AM  8/14:  HL @ 19:30 = < 0.1   Will order Heparin 2600 units IV X 1 bolus and increase drip rate to 1400 units/hr.  Will order HL 6 hrs after rate change.   Pharmacy will continue to follow.   Robbins,Jason D 11/04/2015,10:05 PM   0450 11/05/2015 heparin level subtherapeutic. Increase rate to 1550 units/hr. Will recheck heparin level in 6 hours.  Jaquis Picklesimer A. [place], [place].D., BCPS Clinical Pharmacist [phone] 11/05/2015

## 2015-11-04 NOTE — ED Provider Notes (Signed)
[facility] Emergency Department Provider Note        Time seen: ----------------------------------------- 8:54 AM on 11/04/2015 -----------------------------------------    I have reviewed the triage vital signs and the nursing notes.   HISTORY  Chief Complaint Abdominal Pain and Weakness    HPI Brian Casey is a 65 y.o. male who presents to ER with right lower quadrant abdominal pain radiating into the right upper quadrant. Patient states he feels very sore and tender. States pain started on Saturday, he vomited once. He doesn't history of hernia operation but still has his appendix. Patient states pain is worse whenever he moves or changes positions. Does drink alcohol daily, he has felt weak and tired and his underlying stress at work.   Past Medical History:  Diagnosis Date  . GERD (gastroesophageal reflux disease)   . High cholesterol   . Hypertension 10/17/2013  . Hypothyroidism   . Thyroid disease   . Type II diabetes mellitus [facility])     Patient Active Problem List   Diagnosis Date Noted  . Visit for well man health check 11/06/2014  . Counseling on health promotion and disease prevention 03/29/2014  . Umbilical hernia 07/86/7544  . 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 Essential hypertension 10/17/2013  . Weight gain 10/11/2013  . Swelling of right knee joint 10/11/2013  . Puncture wound 08/18/2013  . Left low back pain 04/13/2013  . HLD (hyperlipidemia) 01/26/2011  . Anxiety state 12/26/2008  . Hypothyroidism 03/02/2007  . Diabetes ([place]) 03/02/2007  . NICOTINE ADDICTION 03/02/2007  . DEPRESSION/ ANGER CONTROL 03/02/2007    Past Surgical History:  Procedure Laterality Date  . COLONOSCOPY    . CYST REMOVAL HAND Left finger  . hemmorhoidectomy  1982  . INSERTION OF MESH N/A 11/16/2013   Procedure: INSERTION OF MESH;  Surgeon: Imogene Burn. Georgette Dover, MD;  Location: [place];  Service: General;  Laterality: N/A;  . UMBILICAL HERNIA REPAIR N/A  11/16/2013   Procedure: UMBILICAL HERNIA REPAIR;  Surgeon: Imogene Burn. Georgette Dover, MD;  Location: [place] [facility];  Service: General;  Laterality: N/A;    Allergies Bupropion hcl and Penicillins  Social History Social History  Substance Use Topics  . Smoking status: Current Some Day Smoker    Packs/day: 0.13    Years: 30.00    Types: Cigarettes  . Smokeless tobacco: Never Used  . Alcohol use 8.4 oz/week    7 Cans of beer, 7 Shots of liquor per week    Review of Systems Constitutional: Negative for fever. Cardiovascular: Negative for chest pain. Respiratory: Negative for shortness of breath. Gastrointestinal: Positive for abdominal pain Genitourinary: Negative for dysuria. Musculoskeletal: Negative for back pain. Skin: Negative for rash. Neurological: Negative for headaches, positive for weakness  10-point ROS otherwise negative.  ____________________________________________   PHYSICAL EXAM:  VITAL SIGNS: ED Triage Vitals  Enc Vitals Group     BP 11/04/15 0843 (!) 103/49     Pulse Rate 11/04/15 0843 (!) 170     Resp 11/04/15 0843 18     Temp 11/04/15 0843 97.6 F (36.4 C)     Temp Source 11/04/15 0843 Oral     SpO2 11/04/15 0843 97 %     Weight 11/04/15 0839 210 lb (95.3 kg)     Height 11/04/15 0839 _0  (1.702 m)     Head Circumference --      Peak Flow --      Pain Score 11/04/15 0839 7     Pain Loc --  Pain Edu? --      Excl. in [place]? --     Constitutional: Alert and oriented. Well appearing and in no distress. Eyes: Conjunctivae are normal. PERRL. Normal extraocular movements. ENT   Head: Normocephalic and atraumatic.   Nose: No congestion/rhinnorhea.   Mouth/Throat: Mucous membranes are moist.   Neck: No stridor. Cardiovascular: Rapid rate, irregular rhythm. No murmurs, rubs, or gallops. Respiratory: Normal respiratory effort without tachypnea nor retractions. Breath sounds are clear and equal bilaterally. No wheezes/rales/rhonchi. Gastrointestinal:  Right lower quadrant tenderness, McBurney's point tenderness, no rebound or guarding. Normal bowel sounds. Musculoskeletal: Nontender with normal range of motion in all extremities. No lower extremity tenderness nor edema. Neurologic:  Normal speech and language. No gross focal neurologic deficits are appreciated.  Skin:  Skin is warm, dry and intact. No rash noted. Psychiatric: Mood and affect are normal. Speech and behavior are normal.  ____________________________________________  EKG: Interpreted by me. atrial fibrillation with rapid ventricular response, rate is 169 bpm, normal QRS, normal QT interval. Left axis deviation.  ____________________________________________  ED COURSE:  Pertinent labs & imaging results that were available during my care of the patient were reviewed by me and considered in my medical decision making (see chart for details). Clinical Course  Patient presents to ER with abdominal pain of uncertain etiology, likely appendicitis. We will obtain CT imaging, give IV pain medication and reevaluate.  Procedures ____________________________________________   LABS (pertinent positives/negatives)  Labs Reviewed  CBC WITH DIFFERENTIAL/PLATELET - Abnormal; Notable for the following:       Result Value   WBC 24.2 (*)    RBC 4.26 (*)    MCH 34.2 (*)    Neutro Abs 22.6 (*)    Lymphs Abs 0.5 (*)    All other components within normal limits  COMPREHENSIVE METABOLIC PANEL - Abnormal; Notable for the following:    Sodium 121 (*)    Chloride 87 (*)    Glucose, Bld 209 (*)    BUN 31 (*)    Creatinine, Ser 2.40 (*)    Calcium 8.7 (*)    Total Bilirubin 1.8 (*)    GFR calc non Af Amer 27 (*)    GFR calc Af Amer 31 (*)    All other components within normal limits  TROPONIN I - Abnormal; Notable for the following:    Troponin I 0.27 (*)    All other components within normal limits  LIPASE, BLOOD  URINALYSIS COMPLETEWITH MICROSCOPIC ([facility] ONLY)  TSH  T4, FREE   APTT  PROTIME-INR  CRITICAL CARE Performed by: Earleen Newport   Total critical care time: 30 minutes  Critical care time was exclusive of separately billable procedures and treating other patients.  Critical care was necessary to treat or prevent imminent or life-threatening deterioration.  Critical care was time spent personally by me on the following activities: development of treatment plan with patient and/or surrogate as well as nursing, discussions with consultants, evaluation of patient's response to treatment, examination of patient, obtaining history from patient or surrogate, ordering and performing treatments and interventions, ordering and review of laboratory studies, ordering and review of radiographic studies, pulse oximetry and re-evaluation of patient's condition.   RADIOLOGY Images were viewed by me  CT of the abdomen and pelvis without contrast IMPRESSION: 1. Distended gallbladder with wall thickening and surrounding inflammation highly suspicious for acute cholecystitis. Correlate clinically. No calcified gallstones or biliary dilatation identified. 2. No other acute findings are seen.  The appendix appears normal. 3.  Bibasilar atelectasis, hepatic steatosis and mild distal colonic diverticulosis. 4.  Aortic Atherosclerosis (ICD10-170.0)   ____________________________________________  FINAL ASSESSMENT AND PLAN  Abdominal pain, Acute renal failure, elevated troponin, hyponatremia, cholecystitis  Plan: Patient with labs and imaging as dictated above. Patient was started on normal saline, given IV Cardizem for rate control of his rapid atrial fibrillation. He had subsequent improvement in his heart rate and did not have a significant change in his blood pressure. He was found to have significant hyponatremia and acute renal failure, possibly from dehydration. He was started on normal saline, I have given IV Zosyn to cover for cholecystitis. We will  consult surgery, he will need to be admitted by the medical service.   Earleen Newport, MD   Note: This dictation was prepared with Dragon dictation. Any transcriptional errors that result from this process are unintentional    Earleen Newport, MD 11/04/15 1023

## 2015-11-04 NOTE — Progress Notes (Signed)
ANTICOAGULATION CONSULT NOTE - Initial Consult  Pharmacy Consult for Heparin drip Indication: chest pain/ACS and atrial fibrillation  Allergies  Allergen Reactions  . Bupropion Hcl     REACTION: Severe constipation, insomnia  . Penicillins     REACTION: Vomiting    Patient Measurements: Height: _0  (170.2 cm) Weight: 210 lb (95.3 kg) IBW/kg (Calculated) : 66.1 Heparin Dosing Weight: 86.4 kg  Vital Signs: Temp: 97.6 F (36.4 C) (08/14 0843) Temp Source: Oral (08/14 0843) BP: 103/49 (08/14 0843) Pulse Rate: 170 (08/14 0843)  Labs:  Recent Labs  11/04/15 0841  HGB 14.6  HCT 40.9  PLT 176  CREATININE 2.40*  TROPONINI 0.27*    Estimated Creatinine Clearance: 34.7 mL/min (by C-G formula based on SCr of 2.4 mg/dL).    Assessment: 65 yo male with elevated troponin and AFib with RVR. No anticoagulants noted in prior to admission med list. Spoke with ED MD, would NOT like a heparin bolus at this time as pt may need surgery; would just like the maintenance drip started.   Hgb 14.6, Plt 176 APTT and INR pending  Goal of Therapy:  Heparin level 0.3-0.7 units/ml Monitor platelets by anticoagulation protocol: Yes   Plan:  Heparin drip at 1100 units/hr (=11 ml/hr) Heparin level in 6h at 1800  CBC in AM  Pharmacy will continue to follow.   Rayna Sexton L 11/04/2015,10:22 AM

## 2015-11-04 NOTE — ED Notes (Signed)
Tranferred to room 234. Report to [place].

## 2015-11-04 NOTE — Consult Note (Signed)
Pharmacy Antibiotic Note  Brian Casey is a 65 y.o. male admitted on 11/04/2015 with intra abdominal infection.  Pharmacy has been consulted for zosyn dosing.  Plan: Zosyn 3.375g IV q8h (4 hour infusion).  Pt has [place] allergy. Called and clarified with pt. States he was put on [facility], had vomitting/diahhrea when he was taken off it, symptoms went away. States that was 40 some years ago. He says he sure he has had amoxicilin since then, just not [facility]. Ok to proceed, just monitor  Height: _0  (170.2 cm) Weight: 207 lb 6.4 oz (94.1 kg) IBW/kg (Calculated) : 66.1  Temp (24hrs), Avg:97.9 F (36.6 C), Min:97.6 F (36.4 C), Max:98.1 F (36.7 C)   Recent Labs Lab 11/04/15 0841  WBC 24.2*  CREATININE 2.40*    Estimated Creatinine Clearance: 34.4 mL/min (by C-G formula based on SCr of 2.4 mg/dL).    Allergies  Allergen Reactions  . Bupropion Hcl     REACTION: Severe constipation, insomnia  . Penicillins Other (See Comments)    REACTION: Vomiting Has patient had a PCN reaction causing immediate rash, facial/tongue/throat swelling, SOB or lightheadedness with hypotension: Yes Has patient had a PCN reaction causing severe rash involving mucus membranes or skin necrosis: No Has patient had a PCN reaction that required hospitalization No Has patient had a PCN reaction occurring within the last 10 years: No If all of the above answers are "NO", then may proceed with Cephalosporin use.    Antimicrobials this admission: cefepime 8/14 >> one dose zosyn 8/14 >>   Dose adjustments this admission:   Microbiology results:  Thank you for allowing pharmacy to be a part of this patient's care.  Ramond Dial 11/04/2015 2:36 PM

## 2015-11-04 NOTE — Consult Note (Signed)
Cardiology Consultation Note  Patient ID: Brian Casey, MRN: [id number], DOB/AGE: [id number] 63 y.o. Admit date: 11/04/2015   Date of Consult: 11/04/2015 Primary Physician: [name], MD Primary Cardiologist: New to [facility] Requesting Physician: Dr. Benjie Karvonen, MD  Chief Complaint: RUQ pain Reason for Consult: New onset Afib with RVR  HPI: 65 y.o. male with h/o DM2, HTN, HLD, hypothyroidisim, and chronic alcohol abuse presented to [facility] on 11/04/15 with RUQ abdominal and was found to have acute cholecystitis on CT scan as well as new onset Afib with RVR with heart rates in the 180's bpm.   No prior known cardiac history. He has noted RUQ pain for the past 3 days. No fevers or chills. Decreased PO intake. Upon his arrival to the [facility] ED he underwent CT abdomen/pelvis that showed acute cholecystitis. Abdominal ultrasound showed gallbladder wall thickening with sludge, no gallstones, Murphy's sign or or biliary dilatation. CT scan was re-reviewed by radiology and findings were still consistent with acute acalculus cholecystitis. He was also noted to be in new onset Afib with RVR. Labs showed a WBC of 24,200, hgb 14.6, Na 121, K+ 4.8, SCr 2.40 (baseline of 1.00), BUN 31, T bili 1.8, lipase 18, initial troponin 0.27-->0.04. EKG showed Afib with RVR, 169 bpm, left axis, nonspecific st/t changes. He was started on Cardizem gtt and heparin infusion. He was also started on vancomycin/Zosyn by IM. He could not feel palpitations. No SOB, dizziness, presyncope, or syncope. Never with any chest pain. Heart rate currently in the low 100's bpm. BP has improved from the loww 603'V systolic to 056'Y systolic. He has been started on IV fluids for his hyponatremia. TSH is pending. He currently has no RUQ pain and cannot feel his rate-controlled Afib.    Past Medical History:  Diagnosis Date  . GERD (gastroesophageal reflux disease)   . High cholesterol   . Hypertension 10/17/2013  . Hypothyroidism   . Thyroid disease     . Type II diabetes mellitus ([place])       Most Recent Cardiac Studies: none   Surgical History:  Past Surgical History:  Procedure Laterality Date  . COLONOSCOPY    . CYST REMOVAL HAND Left finger  . hemmorhoidectomy  1982  . INSERTION OF MESH N/A 11/16/2013   Procedure: INSERTION OF MESH;  Surgeon: Imogene Burn. Georgette Dover, MD;  Location: [place];  Service: General;  Laterality: N/A;  . UMBILICAL HERNIA REPAIR N/A 11/16/2013   Procedure: UMBILICAL HERNIA REPAIR;  Surgeon: Imogene Burn. Georgette Dover, MD;  Location: [place];  Service: General;  Laterality: N/A;     Home Meds: Prior to Admission medications   Medication Sig Start Date End Date Taking? Authorizing Provider  levothyroxine (SYNTHROID, LEVOTHROID) 100 MCG tablet Take 1 tablet (100 mcg total) by mouth daily before breakfast. OFFICE VISIT WITH  LABS REQUIRED FOR ADDITIONAL REFILLS 10/07/15  Yes Lucille Passy, MD  losartan-hydrochlorothiazide (HYZAAR) 100-25 MG tablet Take 1 tablet by mouth daily. 10/02/15 10/01/16 Yes Historical Provider, MD  metFORMIN (GLUCOPHAGE) 500 MG tablet TAKE 1 TABLET BY MOUTH TWICE DAILY WITH A MEAL 03/20/15  Yes Lucille Passy, MD  omeprazole (PRILOSEC) 20 MG capsule Take 1 capsule (20 mg total) by mouth daily. OFFICE VISIT REQUIRED FOR ADDITIONAL REFILLS 10/07/15  Yes Lucille Passy, MD  simvastatin (ZOCOR) 40 MG tablet Take 1 tablet (40 mg total) by mouth at bedtime. 12/10/14  Yes Lucille Passy, MD  ALPRAZolam Duanne Moron) 0.25 MG tablet Take 1 tablet (0.25 mg total) by  mouth 2 (two) times daily as needed. 11/06/14   Lucille Passy, MD  Blood Glucose Monitoring Suppl ([place]) W/DEVICE KIT Use directed- may check blood sugars as needed and up to 3 times daily 04/17/14   Lucille Passy, MD  glucose blood test strip Use as instructed 04/18/14   Lucille Passy, MD  hydrochlorothiazide (MICROZIDE) 12.5 MG capsule TAKE 1 CAPSULE BY MOUTH DAILY 03/20/15   Lucille Passy, MD    Inpatient Medications:  . folic acid  1 mg Oral Daily  .  heparin      . insulin aspart  0-5 Units Subcutaneous QHS  . insulin aspart  0-9 Units Subcutaneous TID WC  . [START ON 11/05/2015] levothyroxine  100 mcg Oral QAC breakfast  . LORazepam  0-4 mg Oral Q6H   Followed by  . [START ON 11/06/2015] LORazepam  0-4 mg Oral Q12H  . multivitamin with minerals  1 tablet Oral Daily  . pantoprazole  40 mg Oral Daily  . simvastatin  40 mg Oral QHS  . sodium chloride flush  3 mL Intravenous Q12H  . thiamine  100 mg Oral Daily   Or  . thiamine  100 mg Intravenous Daily  . vancomycin  1,000 mg Intravenous Once   . sodium chloride 100 mL/hr at 11/04/15 1353  . diltiazem (CARDIZEM) infusion 5 mg/hr (11/04/15 1143)  . heparin 1,100 Units/hr (11/04/15 1139)    Allergies:  Allergies  Allergen Reactions  . Bupropion Hcl     REACTION: Severe constipation, insomnia  . Penicillins Other (See Comments)    REACTION: Vomiting Has patient had a PCN reaction causing immediate rash, facial/tongue/throat swelling, SOB or lightheadedness with hypotension: Yes Has patient had a PCN reaction causing severe rash involving mucus membranes or skin necrosis: No Has patient had a PCN reaction that required hospitalization No Has patient had a PCN reaction occurring within the last 10 years: No If all of the above answers are "NO", then may proceed with Cephalosporin use.    Social History   Social History  . Marital status: Married    Spouse name: N/A  . Number of children: 2  . Years of education: N/A   Occupational History  . Product Engineer-terminated, Engineer, manufacturing systems    Social History Main Topics  . Smoking status: Current Some Day Smoker    Packs/day: 0.13    Years: 30.00    Types: Cigarettes  . Smokeless tobacco: Never Used  . Alcohol use 8.4 oz/week    7 Cans of beer, 7 Shots of liquor per week  . Drug use: No  . Sexual activity: Not on file   Other Topics Concern  . Not on file   Social History Narrative  . No  narrative on file     Family History  Problem Relation Age of Onset  . Cancer Mother     colon  . Hypertension Mother   . Diabetes Mother   . Depression Mother   . Colon cancer Mother   . Hyperthyroidism Father   . Hypertension Father   . Cancer Father     prostate  . Diabetes Maternal Grandmother   . Cancer Maternal Grandmother     lung, non smoker     Review of Systems: Review of Systems  Constitutional: Positive for malaise/fatigue. Negative for chills, diaphoresis, fever and weight loss.  HENT: Negative for congestion.   Eyes: Negative for discharge and redness.  Respiratory: Negative  for cough, hemoptysis, sputum production, shortness of breath and wheezing.   Cardiovascular: Negative for chest pain, palpitations, orthopnea, claudication, leg swelling and PND.  Gastrointestinal: Positive for abdominal pain. Negative for blood in stool, heartburn, melena, nausea and vomiting.  Genitourinary: Negative for hematuria.  Musculoskeletal: Negative for falls and myalgias.  Skin: Negative for rash.  Neurological: Negative for dizziness, tingling, tremors, sensory change, speech change, focal weakness, loss of consciousness and weakness.  Endo/Heme/Allergies: Does not bruise/bleed easily.  Psychiatric/Behavioral: Negative for substance abuse. The patient is not nervous/anxious.   All other systems reviewed and are negative.   Labs:  Recent Labs  11/04/15 0841  TROPONINI 0.27*   Lab Results  Component Value Date   WBC 24.2 (H) 11/04/2015   HGB 14.6 11/04/2015   HCT 40.9 11/04/2015   MCV 96.2 11/04/2015   PLT 176 11/04/2015     Recent Labs Lab 11/04/15 0841  NA 121*  K 4.8  CL 87*  CO2 22  BUN 31*  CREATININE 2.40*  CALCIUM 8.7*  PROT 6.9  BILITOT 1.8*  ALKPHOS 55  ALT 22  AST 28  GLUCOSE 209*   Lab Results  Component Value Date   CHOL 150 10/30/2014   HDL 42.30 10/30/2014   LDLCALC 88 10/30/2014   TRIG 99.0 10/30/2014   No results found for:  DDIMER  Radiology/Studies:  Ct Abdomen Pelvis Wo Contrast  Result Date: 11/04/2015 CLINICAL DATA:  Right lower quadrant pain extending into the right upper and central abdomen. Symptoms for 2 days. History of hernia repair. EXAM: CT ABDOMEN AND PELVIS WITHOUT CONTRAST TECHNIQUE: Multidetector CT imaging of the abdomen and pelvis was performed following the standard protocol without IV contrast. COMPARISON:  None. FINDINGS: Lower chest: Mild dependent airspace opacities at both lung bases, probably atelectasis. No significant pleural or pericardial effusion. Prominent coronary artery atherosclerosis noted. There is a small hiatal hernia. Hepatobiliary: The hepatic density is diffusely decreased, consistent with steatosis. No focal hepatic abnormalities are identified on noncontrast imaging. The gallbladder is distended with wall thickening and prominent surrounding inflammatory changes. No calcified gallstones are seen. There is no significant biliary dilatation. Pancreas: Unremarkable. No pancreatic ductal dilatation or surrounding inflammatory changes. Spleen: Normal in size without focal abnormality. Adrenals/Urinary Tract: Both adrenal glands appear normal. Both kidneys demonstrate mild cortical thinning and symmetric perinephric soft tissue stranding. 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 No evidence of renal mass, hydronephrosis or urinary tract calculus. The bladder appears unremarkable. Stomach/Bowel: No evidence bowel wall thickening or distention. The right upper quadrant inflammatory changes do not appear to arise from the duodenum. The appendix appears normal. There is mild distal colonic diverticulosis. Vascular/Lymphatic: There are no enlarged abdominal or pelvic lymph nodes. Aortic and branch vessel atherosclerosis noted. Vascular assessment limited without contrast. Reproductive: There is a focal parenchymal calcification within the right aspect of the prostate  gland which is not significantly enlarged. The seminal vesicles appear normal. Other: No evidence of abdominal wall mass or hernia. Musculoskeletal: No acute or significant osseous findings. IMPRESSION: 1. Distended gallbladder with wall thickening and surrounding inflammation highly suspicious for acute cholecystitis. Correlate clinically. No calcified gallstones or biliary dilatation identified. 2. No other acute findings are seen.  The appendix appears normal. 3. Bibasilar atelectasis, hepatic steatosis and mild distal colonic diverticulosis. 4.  Aortic Atherosclerosis (ICD10-170.0) Electronically Signed   By: Richardean Sale M.D.   On: 11/04/2015 10:17   US Abdomen Limited Ruq  Result Date: 11/04/2015 CLINICAL  DATA:  Right-sided abdominal pain for 2 days.  Abnormal CT. EXAM: US ABDOMEN LIMITED - RIGHT UPPER QUADRANT COMPARISON:  CT 11/04/2015. FINDINGS: Gallbladder: Gallbladder wall thickening to 7 mm is noted at the gallbladder fundus. Gallbladder sludge is present. No discrete gallstones are seen. Sonographic Percell Miller sign is absent according to the sonographer. Common bile duct: Diameter: 5 mm. Liver: Increased echogenicity corresponding with steatosis on CT. No focal hepatic abnormalities identified. IMPRESSION: 1. Gallbladder wall thickening and sludge without gallstones, sonographic Murphy sign or biliary dilatation. 2. I reviewed the CT from earlier today, and the inflammatory changes on that study are in the right upper quadrant with the epicenter at the gallbladder. In conjunction with that study, findings still remain concerning for acalculous cholecystitis. The patient's appendix does lie in the right upper quadrant adjacent to the gallbladder, but appears normal. There is no apparent wall thickening of the duodenum or colon. The pancreas appears normal, and the patient does not have an elevated lipase level, although does have leukocytosis. Electronically Signed   By: Richardean Sale M.D.   On:  11/04/2015 11:35    EKG: Interpreted by me showed: Afib with RVR, 169 bpm, left axis, nonspecific st/t changes Telemetry: Interpreted by me showed: Afib 70's to 90's bpm currently  Weights: Filed Weights   11/04/15 0839 11/04/15 1230  Weight: 210 lb (95.3 kg) 207 lb 6.4 oz (94.1 kg)     Physical Exam: Blood pressure 134/67, pulse 84, temperature 98.1 F (36.7 C), resp. rate 20, height 5' 7" (1.702 m), weight 207 lb 6.4 oz (94.1 kg), SpO2 98 %. Body mass index is 32.48 kg/m. General: Well developed, well nourished, in no acute distress. Head: Normocephalic, atraumatic, sclera non-icteric, no xanthomas, nares are without discharge.  Neck: Negative for carotid bruits. JVD not elevated. Lungs: Clear bilaterally to auscultation without wheezes, rales, or rhonchi. Breathing is unlabored. Heart: Irregularly-irregular, with S1 S2. No murmurs, rubs, or gallops appreciated. Abdomen: Soft, tender RUQ, non-distended with normoactive bowel sounds. No hepatomegaly. No rebound/guarding. No obvious abdominal masses. Msk:  Strength and tone appear normal for age. Extremities: No clubbing or cyanosis. No edema. Distal pedal pulses are 2+ and equal bilaterally. Neuro: Alert and oriented X 3. No facial asymmetry. No focal deficit. Moves all extremities spontaneously. Psych:  Responds to questions appropriately with a normal affect.    Assessment and Plan:  Principal Problem:   Acute cholecystitis Active Problems:   New onset atrial fibrillation (HCC)   Hypothyroidism   Diabetes ([place])   Essential hypertension   Anxiety state   NICOTINE ADDICTION   HLD (hyperlipidemia)    1. New onset Afib with RVR:  -Heart rate has improved to the 70's to 90's bpm, remains in Afib -Goal heart rate < 100 bpm -Likely in the setting of his RUQ pain and acute acalculus cholecystitis  -Rate control as BP allows, monitoring for septic shock  -Continue diltiazem gtt for now in the acute infection, currently on 5  mg /hr. Titrate as needed as BP will allow -As he improves can transition to PO medications  -CHADS2VASc at least 2 (HTN, DM)  -Continue heparin gtt for anticoagulation given CHADS2VASc above  -If he remains in Afib would start [place] for possible DCCV after he has been adequately anticoagulated   2. Elevated troponin:  -Never with chest pain -Trend of 0.27-->0.04 -Likely in the setting of the patient's Afib with RVR with heart rates into the 170's bpm and sepsis with acalculus cholecystitis  -Check echo  -  Can plan for nuclear stress testing when the above is resolved given Afib onset and troponin elevation  3. HTN:  -Improved   4. ETOH abuse:  -Per IM   5. HLD:  -Statin   6. Acute cholecystitis: -Per primary team  Signed, Christell Faith, PA-C [facility] Pager: [phone] 11/04/2015, 2:09 PM

## 2015-11-04 NOTE — Consult Note (Signed)
[facility] CONSULT NOTE    Date: 11/04/2015                  Patient Name:  Brian Casey  MRN: [id number]  DOB: 1950/08/23  Age / Sex: 65 y.o., male         PCP: [name], MD                 Service Requesting Consult: Dr. Benjie Karvonen                 Reason for Consult: Acute renal failure            History of Present Illness: Patient is a 65 y.o. male with a PMHx of GERD, hyperlipidemia, hypertension, hypothyroidism, diabetes mellitus type 2, who was admitted to [facility] on 11/04/2015 for evaluation of abdominal pain. Patient reports that the symptoms began this past Saturday.  Pain is located in the right upper quadrant. He's also had some associated nausea and vomiting.  By mouth intake has been decreased.  CT scan and ultrasound are suggestive of acalculous cholecystitis.  He has been started on antibiotic therapy.  We are asked to see him for evaluation management of acute renal failure.  The patient's baseline creatinine is 1.1 with an EGFR of 68.  At the moment creatinine is up to 2.4.  Patient also has significant leukocytosis with a WBC count of 24.2.  He has been started on IV fluid hydration.   Medications: Outpatient medications: Prescriptions Prior to Admission  Medication Sig Dispense Refill Last Dose  . levothyroxine (SYNTHROID, LEVOTHROID) 100 MCG tablet Take 1 tablet (100 mcg total) by mouth daily before breakfast. OFFICE VISIT WITH  LABS REQUIRED FOR ADDITIONAL REFILLS 30 tablet 0 11/04/2015 at Unknown time  . losartan-hydrochlorothiazide (HYZAAR) 100-25 MG tablet Take 1 tablet by mouth daily.     . metFORMIN (GLUCOPHAGE) 500 MG tablet TAKE 1 TABLET BY MOUTH TWICE DAILY WITH A MEAL 180 tablet 1 11/04/2015 at Unknown time  . omeprazole (PRILOSEC) 20 MG capsule Take 1 capsule (20 mg total) by mouth daily. OFFICE VISIT REQUIRED FOR ADDITIONAL REFILLS 30 capsule 0   . simvastatin (ZOCOR) 40 MG tablet Take 1 tablet (40 mg total) by mouth at bedtime. 90  tablet 0 11/03/2015 at Unknown time  . ALPRAZolam (XANAX) 0.25 MG tablet Take 1 tablet (0.25 mg total) by mouth 2 (two) times daily as needed. 30 tablet 0 Taking  . Blood Glucose Monitoring Suppl (ONE TOUCH BASIC SYSTEM) W/DEVICE KIT Use directed- may check blood sugars as needed and up to 3 times daily 1 each 0 Taking  . glucose blood test strip Use as instructed 100 each 12 Taking  . hydrochlorothiazide (MICROZIDE) 12.5 MG capsule TAKE 1 CAPSULE BY MOUTH DAILY 90 capsule 1     Current medications: Current Facility-Administered Medications  Medication Dose Route Frequency Provider Last Rate Last Dose  . 0.9 %  sodium chloride infusion   Intravenous Continuous Bettey Costa, MD 100 mL/hr at 11/04/15 1353    . acetaminophen (TYLENOL) tablet 650 mg  650 mg Oral Q6H PRN Bettey Costa, MD       Or  . acetaminophen (TYLENOL) suppository 650 mg  650 mg Rectal Q6H PRN Bettey Costa, MD      . diltiazem (CARDIZEM) 100 mg in dextrose 5 % 100 mL (1 mg/mL) infusion  5-15 mg/hr Intravenous Titrated Bettey Costa, MD 5 mL/hr at 11/04/15 1143 5 mg/hr at 11/04/15 1143  . folic  acid (FOLVITE) tablet 1 mg  1 mg Oral Daily Sital Mody, MD   1 mg at 11/04/15 1352  . heparin 5000 UNIT/ML injection           . heparin ADULT infusion 100 units/mL (25000 units/223m sodium chloride 0.45%)  1,100 Units/hr Intravenous Continuous JEarleen Newport MD 11 mL/hr at 11/04/15 1139 1,100 Units/hr at 11/04/15 1139  . HYDROcodone-homatropine (HYCODAN) 5-1.5 MG/5ML syrup 5 mL  5 mL Oral Q8H PRN Sital Mody, MD      . insulin aspart (novoLOG) injection 0-5 Units  0-5 Units Subcutaneous QHS Sital Mody, MD      . insulin aspart (novoLOG) injection 0-9 Units  0-9 Units Subcutaneous TID WC Sital Mody, MD      . ketorolac (TORADOL) 15 MG/ML injection 15 mg  15 mg Intravenous Q6H PRN SBettey Costa MD      . [Derrill MemoON 11/05/2015] levothyroxine (SYNTHROID, LEVOTHROID) tablet 100 mcg  100 mcg Oral QAC breakfast Sital Mody, MD      . LORazepam (ATIVAN)  tablet 1 mg  1 mg Oral Q6H PRN SBettey Costa MD       Or  . LORazepam (ATIVAN) injection 1 mg  1 mg Intravenous Q6H PRN Sital Mody, MD      . LORazepam (ATIVAN) tablet 0-4 mg  0-4 mg Oral Q6H SBettey Costa MD       Followed by  . [START ON 11/06/2015] LORazepam (ATIVAN) tablet 0-4 mg  0-4 mg Oral Q12H Sital Mody, MD      . multivitamin with minerals tablet 1 tablet  1 tablet Oral Daily SBettey Costa MD   1 tablet at 11/04/15 1352  . ondansetron (ZOFRAN) tablet 4 mg  4 mg Oral Q6H PRN SBettey Costa MD       Or  . ondansetron (ZOFRAN) injection 4 mg  4 mg Intravenous Q6H PRN Sital Mody, MD      . pantoprazole (PROTONIX) EC tablet 40 mg  40 mg Oral Daily Sital Mody, MD      . piperacillin-tazobactam (ZOSYN) IVPB 3.375 g  3.375 g Intravenous Q8H Sital Mody, MD      . senna-docusate (Senokot-S) tablet 1 tablet  1 tablet Oral QHS PRN SBettey Costa MD      . simvastatin (ZOCOR) tablet 40 mg  40 mg Oral QHS Sital Mody, MD      . sodium chloride flush (NS) 0.9 % injection 3 mL  3 mL Intravenous Q12H Sital Mody, MD      . thiamine (VITAMIN B-1) tablet 100 mg  100 mg Oral Daily Sital Mody, MD   100 mg at 11/04/15 1353   Or  . thiamine (B-1) injection 100 mg  100 mg Intravenous Daily Sital Mody, MD      . traMADol (ULTRAM) tablet 50 mg  50 mg Oral Q6H PRN SBettey Costa MD      . vancomycin (VANCOCIN) IVPB 1000 mg/200 mL premix  1,000 mg Intravenous Once JEarleen Newport MD          Allergies: Allergies  Allergen Reactions  . Bupropion Hcl     REACTION: Severe constipation, insomnia  . Penicillins Other (See Comments)    REACTION: Vomiting Has patient had a PCN reaction causing immediate rash, facial/tongue/throat swelling, SOB or lightheadedness with hypotension: Yes Has patient had a PCN reaction causing severe rash involving mucus membranes or skin necrosis: No Has patient had a PCN reaction that required hospitalization No Has patient had a PCN reaction  occurring within the last 10 years: No If all  of the above answers are "NO", then may proceed with Cephalosporin use.      Past Medical History: Past Medical History:  Diagnosis Date  . GERD (gastroesophageal reflux disease)   . High cholesterol   . Hypertension 10/17/2013  . Hypothyroidism   . Thyroid disease   . Type II diabetes mellitus ([place])      Past Surgical History: Past Surgical History:  Procedure Laterality Date  . COLONOSCOPY    . CYST REMOVAL HAND Left finger  . hemmorhoidectomy  1982  . INSERTION OF MESH N/A 11/16/2013   Procedure: INSERTION OF MESH;  Surgeon: Imogene Burn. Georgette Dover, MD;  Location: [place];  Service: General;  Laterality: N/A;  . UMBILICAL HERNIA REPAIR N/A 11/16/2013   Procedure: UMBILICAL HERNIA REPAIR;  Surgeon: Imogene Burn. Georgette Dover, MD;  Location: [place] [facility];  Service: General;  Laterality: N/A;     Family History: Family History  Problem Relation Age of Onset  . Cancer Mother     colon  . Hypertension Mother   . Diabetes Mother   . Depression Mother   . Colon cancer Mother   . Hyperthyroidism Father   . Hypertension Father   . Cancer Father     prostate  . Diabetes Maternal Grandmother   . Cancer Maternal Grandmother     lung, non smoker     Social History: Social History   Social History  . Marital status: Married    Spouse name: N/A  . Number of children: 2  . Years of education: N/A   Occupational History  . Product Engineer-terminated, Engineer, manufacturing systems    Social History Main Topics  . Smoking status: Current Some Day Smoker    Packs/day: 0.13    Years: 30.00    Types: Cigarettes  . Smokeless tobacco: Never Used  . Alcohol use 8.4 oz/week    7 Cans of beer, 7 Shots of liquor per week  . Drug use: No  . Sexual activity: Not on file   Other Topics Concern  . Not on file   Social History Narrative  . No narrative on file     Review of Systems: Review of Systems  Constitutional: Positive for malaise/fatigue and weight loss. Negative for chills,  diaphoresis and fever.  HENT: Negative for hearing loss and tinnitus.   Eyes: Negative for blurred vision and double vision.  Respiratory: Negative for cough, hemoptysis and sputum production.   Cardiovascular: Positive for palpitations. Negative for chest pain, orthopnea and claudication.  Gastrointestinal: Positive for abdominal pain, nausea and vomiting. Negative for heartburn.  Genitourinary: Negative for dysuria and urgency.  Musculoskeletal: Positive for back pain. Negative for myalgias.  Skin: Negative for itching and rash.  Neurological: Positive for dizziness and focal weakness. Negative for headaches.  Endo/Heme/Allergies: Does not bruise/bleed easily.  Psychiatric/Behavioral: Negative for depression. The patient is nervous/anxious.      Vital Signs: Blood pressure 134/67, pulse 84, temperature 98.1 F (36.7 C), resp. rate 20, height _0  (1.702 m), weight 94.1 kg (207 lb 6.4 oz), SpO2 98 %.  Weight trends: Filed Weights   11/04/15 0839 11/04/15 1230  Weight: 95.3 kg (210 lb) 94.1 kg (207 lb 6.4 oz)    Physical Exam: General: NAD, sitting up in bed  Head: Normocephalic, atraumatic.  Eyes: Anicteric, EOMI  Nose: Mucous membranes moist, not inflammed, nonerythematous.  Throat: Oropharynx nonerythematous, no exudate appreciated.   Neck: Supple, trachea midline.  Lungs:  Normal respiratory effort. Clear to auscultation BL without crackles or wheezes.  Heart: RRR. S1 and S2 normal without gallop, murmur, or rubs.  Abdomen:  RUQ tenderness noted, mild rebound, BS present  Extremities: No pretibial edema.  Neurologic: A&O X3, Motor strength is 5/5 in the all 4 extremities  Skin: No visible rashes, scars.    Lab results: Basic Metabolic Panel:  Recent Labs Lab 11/04/15 0841 11/04/15 0842  NA 121*  --   K 4.8  --   CL 87*  --   CO2 22  --   GLUCOSE 209*  --   BUN 31*  --   CREATININE 2.40*  --   CALCIUM 8.7*  --   MG  --  1.9    Liver Function  Tests:  Recent Labs Lab 11/04/15 0841  AST 28  ALT 22  ALKPHOS 55  BILITOT 1.8*  PROT 6.9  ALBUMIN 3.5    Recent Labs Lab 11/04/15 0841  LIPASE 18   No results for input(s): AMMONIA in the last 168 hours.  CBC:  Recent Labs Lab 11/04/15 0841  WBC 24.2*  NEUTROABS 22.6*  HGB 14.6  HCT 40.9  MCV 96.2  PLT 176    Cardiac Enzymes:  Recent Labs Lab 11/04/15 0841 11/04/15 1257  TROPONINI 0.27* 0.04*    BNP: Invalid input(s): POCBNP  CBG: No results for input(s): GLUCAP in the last 168 hours.  Microbiology: Results for orders placed or performed in visit on 02/10/11  Fecal occult blood, imunochemical     Status: None   Collection Time: 02/10/11 11:21 AM  Result Value Ref Range Status   Fecal Occult Bld Negative Negative Final    Coagulation Studies:  Recent Labs  11/04/15 0841  LABPROT 14.7  INR 1.14    Urinalysis: No results for input(s): COLORURINE, LABSPEC, PHURINE, GLUCOSEU, HGBUR, BILIRUBINUR, KETONESUR, PROTEINUR, UROBILINOGEN, NITRITE, LEUKOCYTESUR in the last 72 hours.  Invalid input(s): APPERANCEUR    Imaging: Ct Abdomen Pelvis Wo Contrast  Result Date: 11/04/2015 CLINICAL DATA:  Right lower quadrant pain extending into the right upper and central abdomen. Symptoms for 2 days. History of hernia repair. EXAM: CT ABDOMEN AND PELVIS WITHOUT CONTRAST TECHNIQUE: Multidetector CT imaging of the abdomen and pelvis was performed following the standard protocol without IV contrast. COMPARISON:  None. FINDINGS: Lower chest: Mild dependent airspace opacities at both lung bases, probably atelectasis. No significant pleural or pericardial effusion. Prominent coronary artery atherosclerosis noted. There is a small hiatal hernia. Hepatobiliary: The hepatic density is diffusely decreased, consistent with steatosis. No focal hepatic abnormalities are identified on noncontrast imaging. The gallbladder is distended with wall thickening and prominent  surrounding inflammatory changes. No calcified gallstones are seen. There is no significant biliary dilatation. Pancreas: Unremarkable. No pancreatic ductal dilatation or surrounding inflammatory changes. Spleen: Normal in size without focal abnormality. Adrenals/Urinary Tract: Both adrenal glands appear normal. Both kidneys demonstrate mild cortical thinning and symmetric perinephric soft tissue stranding. 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 No evidence of renal mass, hydronephrosis or urinary tract calculus. The bladder appears unremarkable. Stomach/Bowel: No evidence bowel wall thickening or distention. The right upper quadrant inflammatory changes do not appear to arise from the duodenum. The appendix appears normal. There is mild distal colonic diverticulosis. Vascular/Lymphatic: There are no enlarged abdominal or pelvic lymph nodes. Aortic and branch vessel atherosclerosis noted. Vascular assessment limited without contrast. Reproductive: There is a focal parenchymal calcification within the right aspect of the  prostate gland which is not significantly enlarged. The seminal vesicles appear normal. Other: No evidence of abdominal wall mass or hernia. Musculoskeletal: No acute or significant osseous findings. IMPRESSION: 1. Distended gallbladder with wall thickening and surrounding inflammation highly suspicious for acute cholecystitis. Correlate clinically. No calcified gallstones or biliary dilatation identified. 2. No other acute findings are seen.  The appendix appears normal. 3. Bibasilar atelectasis, hepatic steatosis and mild distal colonic diverticulosis. 4.  Aortic Atherosclerosis (ICD10-170.0) Electronically Signed   By: Richardean Sale M.D.   On: 11/04/2015 10:17   US Abdomen Limited Ruq  Result Date: 11/04/2015 CLINICAL DATA:  Right-sided abdominal pain for 2 days.  Abnormal CT. EXAM: US ABDOMEN LIMITED - RIGHT UPPER QUADRANT COMPARISON:  CT 11/04/2015.  FINDINGS: Gallbladder: Gallbladder wall thickening to 7 mm is noted at the gallbladder fundus. Gallbladder sludge is present. No discrete gallstones are seen. Sonographic Percell Miller sign is absent according to the sonographer. Common bile duct: Diameter: 5 mm. Liver: Increased echogenicity corresponding with steatosis on CT. No focal hepatic abnormalities identified. IMPRESSION: 1. Gallbladder wall thickening and sludge without gallstones, sonographic Murphy sign or biliary dilatation. 2. I reviewed the CT from earlier today, and the inflammatory changes on that study are in the right upper quadrant with the epicenter at the gallbladder. In conjunction with that study, findings still remain concerning for acalculous cholecystitis. The patient's appendix does lie in the right upper quadrant adjacent to the gallbladder, but appears normal. There is no apparent wall thickening of the duodenum or colon. The pancreas appears normal, and the patient does not have an elevated lipase level, although does have leukocytosis. Electronically Signed   By: Richardean Sale M.D.   On: 11/04/2015 11:35      Assessment & Plan: Pt is a 65 y.o. male with a PMHx of GERD, hyperlipidemia, hypertension, hypothyroidism, diabetes mellitus type 2, who was admitted to [facility] on 11/04/2015 for evaluation of abdominal pain. Patient reports that the symptoms began this past Saturday.  1.  Acute renal failure/chronic kidney disease stage II baseline cranium 1.1 EGFR 68.  Acute renal failure secondary to sepsis from cholecystitis, use of BC powder, poor by mouth intake and losartan/hydrochlorothiazide.  CT scan of the abdomen and pelvis revealed bilateral renal cortical thinning but no hydronephrosis.  Continue IV fluid hydration at the moment.  No indication for dialysis.  Avoid nephrotoxins for now.  2.  Hyponatremia.  Serum sodium currently 121.  Likely due to poor by mouth intake and use of hydrochlorothiazide in the volume depleted state.   Continue IV fluid hydration with 0.9 normal saline.  3.  Acute cholecystitis.  This appears to be acalculus in nature.  Agree with broad-spectrum antibiotics as well as surgical consultation.  4.  Thank for consultation.

## 2015-11-05 ENCOUNTER — Other Ambulatory Visit: Payer: Managed Care, Other (non HMO)

## 2015-11-05 DIAGNOSIS — I10 Essential (primary) hypertension: Secondary | ICD-10-CM

## 2015-11-05 DIAGNOSIS — I480 Paroxysmal atrial fibrillation: Secondary | ICD-10-CM

## 2015-11-05 DIAGNOSIS — K819 Cholecystitis, unspecified: Secondary | ICD-10-CM

## 2015-11-05 LAB — CBC
HEMATOCRIT: 34.1 % — AB (ref 40.0–52.0)
HEMOGLOBIN: 12.1 g/dL — AB (ref 13.0–18.0)
MCH: 34 pg (ref 26.0–34.0)
MCHC: 35.5 g/dL (ref 32.0–36.0)
MCV: 95.8 fL (ref 80.0–100.0)
Platelets: 128 10*3/uL — ABNORMAL LOW (ref 150–440)
RBC: 3.56 MIL/uL — ABNORMAL LOW (ref 4.40–5.90)
RDW: 13 % (ref 11.5–14.5)
WBC: 13.6 10*3/uL — ABNORMAL HIGH (ref 3.8–10.6)

## 2015-11-05 LAB — COMPREHENSIVE METABOLIC PANEL
ALBUMIN: 2.7 g/dL — AB (ref 3.5–5.0)
ALK PHOS: 66 U/L (ref 38–126)
ALT: 20 U/L (ref 17–63)
ANION GAP: 10 (ref 5–15)
AST: 17 U/L (ref 15–41)
BILIRUBIN TOTAL: 1.1 mg/dL (ref 0.3–1.2)
BUN: 35 mg/dL — ABNORMAL HIGH (ref 6–20)
CALCIUM: 7.6 mg/dL — AB (ref 8.9–10.3)
CO2: 23 mmol/L (ref 22–32)
Chloride: 92 mmol/L — ABNORMAL LOW (ref 101–111)
Creatinine, Ser: 2.1 mg/dL — ABNORMAL HIGH (ref 0.61–1.24)
GFR calc non Af Amer: 32 mL/min — ABNORMAL LOW (ref >60)
GFR, EST AFRICAN AMERICAN: 37 mL/min — AB (ref >60)
GLUCOSE: 124 mg/dL — AB (ref 65–99)
POTASSIUM: 3.7 mmol/L (ref 3.5–5.1)
Sodium: 125 mmol/L — ABNORMAL LOW (ref 135–145)
TOTAL PROTEIN: 5.6 g/dL — AB (ref 6.5–8.1)

## 2015-11-05 LAB — URINALYSIS COMPLETE WITH MICROSCOPIC (ARMC ONLY)
BILIRUBIN URINE: NEGATIVE
Bacteria, UA: NONE SEEN
Glucose, UA: >500 mg/dL — AB
Hgb urine dipstick: NEGATIVE
KETONES UR: NEGATIVE mg/dL
Leukocytes, UA: NEGATIVE
Nitrite: NEGATIVE
PROTEIN: NEGATIVE mg/dL
Specific Gravity, Urine: 1.013 (ref 1.005–1.030)
pH: 6 (ref 5.0–8.0)

## 2015-11-05 LAB — GLUCOSE, CAPILLARY
GLUCOSE-CAPILLARY: 147 mg/dL — AB (ref 65–99)
GLUCOSE-CAPILLARY: 156 mg/dL — AB (ref 65–99)
Glucose-Capillary: 162 mg/dL — ABNORMAL HIGH (ref 65–99)
Glucose-Capillary: 171 mg/dL — ABNORMAL HIGH (ref 65–99)

## 2015-11-05 LAB — TROPONIN I
TROPONIN I: 0.04 ng/mL — AB (ref <0.03)
Troponin I: 0.11 ng/mL (ref <0.03)

## 2015-11-05 LAB — HEPARIN LEVEL (UNFRACTIONATED)
HEPARIN UNFRACTIONATED: 0.29 [IU]/mL — AB (ref 0.30–0.70)
HEPARIN UNFRACTIONATED: 0.19 [IU]/mL — AB (ref 0.30–0.70)
HEPARIN UNFRACTIONATED: 0.31 [IU]/mL (ref 0.30–0.70)

## 2015-11-05 LAB — SODIUM, URINE, RANDOM: SODIUM UR: 36 mmol/L

## 2015-11-05 LAB — ECHOCARDIOGRAM COMPLETE
HEIGHTINCHES: 67 in
WEIGHTICAEL: 3318.4 [oz_av]

## 2015-11-05 MED ORDER — HEPARIN BOLUS VIA INFUSION
2600.0000 [IU] | Freq: Once | INTRAVENOUS | Status: AC
  Administered 2015-11-05: 2600 [IU] via INTRAVENOUS
  Filled 2015-11-05: qty 2600

## 2015-11-05 MED ORDER — ATORVASTATIN CALCIUM 20 MG PO TABS
40.0000 mg | ORAL_TABLET | Freq: Every day | ORAL | Status: DC
  Administered 2015-11-05 – 2015-11-06 (×2): 40 mg via ORAL
  Filled 2015-11-05 (×2): qty 2

## 2015-11-05 MED ORDER — POTASSIUM CHLORIDE 20 MEQ PO PACK
20.0000 meq | PACK | Freq: Three times a day (TID) | ORAL | Status: DC
  Administered 2015-11-05 – 2015-11-07 (×5): 20 meq via ORAL
  Filled 2015-11-05 (×5): qty 1

## 2015-11-05 MED ORDER — DILTIAZEM HCL 30 MG PO TABS
30.0000 mg | ORAL_TABLET | Freq: Four times a day (QID) | ORAL | Status: DC
  Administered 2015-11-05 – 2015-11-07 (×10): 30 mg via ORAL
  Filled 2015-11-05 (×10): qty 1

## 2015-11-05 NOTE — Progress Notes (Signed)
Pt sister called, password provided, updated on plan of care and patient care. Sister thanked rn for her time.

## 2015-11-05 NOTE — Progress Notes (Addendum)
ANTICOAGULATION CONSULT NOTE - Initial Consult  Pharmacy Consult for Heparin drip Indication: chest pain/ACS and atrial fibrillation  Allergies  Allergen Reactions  . Bupropion Hcl     REACTION: Severe constipation, insomnia  . Penicillins Other (See Comments)    REACTION: Vomiting Has patient had a PCN reaction causing immediate rash, facial/tongue/throat swelling, SOB or lightheadedness with hypotension: Yes Has patient had a PCN reaction causing severe rash involving mucus membranes or skin necrosis: No Has patient had a PCN reaction that required hospitalization No Has patient had a PCN reaction occurring within the last 10 years: No If all of the above answers are "NO", then may proceed with Cephalosporin use.    Patient Measurements: Height: _0  (170.2 cm) Weight: 207 lb 6.4 oz (94.1 kg) IBW/kg (Calculated) : 66.1 Heparin Dosing Weight: 86.4 kg  Vital Signs: Temp: 98.2 F (36.8 C) (08/15 2119) Temp Source: Oral (08/15 2119) BP: 156/56 (08/15 2325) Pulse Rate: 81 (08/15 2325)  Labs:  Recent Labs  11/04/15 0841 11/04/15 1257  11/04/15 2355 11/05/15 0450 11/05/15 0607 11/05/15 1402 11/05/15 2133  HGB 14.6  --   --   --  12.1*  --   --   --   HCT 40.9  --   --   --  34.1*  --   --   --   PLT 176  --   --   --  128*  --   --   --   APTT 27  --   --   --   --   --   --   --   LABPROT 14.7  --   --   --   --   --   --   --   INR 1.14  --   --   --   --   --   --   --   HEPARINUNFRC  --   --   < >  --  0.29*  --  0.19* 0.31  CREATININE 2.40*  --   --   --  2.10*  --   --   --   TROPONINI 0.27* 0.04*  --  0.11*  --  0.04*  --   --   < > = values in this interval not displayed.  Estimated Creatinine Clearance: 39.4 mL/min (by C-G formula based on SCr of 2.1 mg/dL).    Assessment: 65 yo male with elevated troponin and AFib with RVR. No anticoagulants noted in prior to admission med list. Spoke with ED MD, would NOT like a heparin bolus at this time as pt may  need surgery; would just like the maintenance drip started.   Hgb 14.6, Plt 176 APTT and INR pending  Goal of Therapy:  Heparin level 0.3-0.7 units/ml Monitor platelets by anticoagulation protocol: Yes   Plan:  Heparin level therapeutic x 1. Continue current rate. Will recheck heparin level in 6 hours.  Icesis Renn A. [place], [place].D., BCPS Clinical Pharmacist 11/05/2015  11:30 PM   0344 11/06/15 heparin level therapeutic x 2. Continue current rate. Pharmacy will continue to check HL and CBC daily. Delesha Pohlman A. [place], [place].D., BCPS

## 2015-11-05 NOTE — Consult Note (Signed)
Brian Casey is a 65 y.o. male  with abdominal pain and probable acalculous cholecystitis.  HPI: He was in his usual state of health until proximal to 6 weeks ago when he developed some intermittent abdominal pain and indigestion and bloating. He had a low-grade fever and thought he may have an infectious gastroenteritis and his symptoms improved. He "fought  through it" and continued about his normal life in the next several weeks. Over this past weekend he had a sudden increase in his right abdominal pain with anorexia and nausea vomiting indigestion and bloating. He presented the emergency room workup suggests acute acalculous cholecystitis. An elevated white blood cell count essentially normal liver function studies. However, his creatinine peaked at 2.4 from a baseline of 1.1. CT scan ultrasound had changes consistent with acalculous cholecystitis. With his clinical presentation and imaging surgical service was consulted.  His wife relates that he said indigestion bloating and nausea for some time and felt that is related to stress. He's never had any cardiac problems. At the time of his admission he was noted to be in atrial fibrillation with rapid ventricular response. He does not have any history of atrial fibrillation. He had a colonoscopy 2 years ago [name] which was unremarkable. He did have some mild diverticulosis. He's had a previous umbilical hernia repair with mesh.  Specifically denies any history of hepatitis, yellow jaundice, pancreatitis, peptic ulcer disease, previous diagnosis of gallbladder disease, or diverticulitis. He does have history of hypertension and hypothyroidism.  Past Medical History:  Diagnosis Date  . GERD (gastroesophageal reflux disease)   . High cholesterol   . Hypertension 10/17/2013  . Hypothyroidism   . Thyroid disease   . Type II diabetes mellitus ([place])    Past Surgical History:  Procedure Laterality Date  . COLONOSCOPY    . CYST REMOVAL HAND Left  finger  . hemmorhoidectomy  1982  . INSERTION OF MESH N/A 11/16/2013   Procedure: INSERTION OF MESH;  Surgeon: Imogene Burn. Georgette Dover, MD;  Location: [place];  Service: General;  Laterality: N/A;  . UMBILICAL HERNIA REPAIR N/A 11/16/2013   Procedure: UMBILICAL HERNIA REPAIR;  Surgeon: Imogene Burn. Georgette Dover, MD;  Location: [place] [facility];  Service: General;  Laterality: N/A;   Social History   Social History  . Marital status: Married    Spouse name: N/A  . Number of children: 2  . Years of education: N/A   Occupational History  . Product Engineer-terminated, Engineer, manufacturing systems    Social History Main Topics  . Smoking status: Current Some Day Smoker    Packs/day: 0.13    Years: 30.00    Types: Cigarettes  . Smokeless tobacco: Never Used  . Alcohol use 8.4 oz/week    7 Cans of beer, 7 Shots of liquor per week  . Drug use: No  . Sexual activity: Not Asked   Other Topics Concern  . None   Social History Narrative  . None    Review of Systems: Review of Systems  Constitutional: Positive for chills, diaphoresis, fever and malaise/fatigue.  HENT: Negative.   Eyes: Negative.   Respiratory: Negative for cough, sputum production, shortness of breath and wheezing.   Cardiovascular: Positive for palpitations. Negative for chest pain and leg swelling.  Gastrointestinal: Positive for abdominal pain, heartburn, nausea and vomiting. Negative for constipation and diarrhea.  Genitourinary: Negative.   Musculoskeletal: Negative.   Skin: Negative.   Neurological: Negative.   Psychiatric/Behavioral: Negative.     PHYSICAL EXAM:  BP (!) 140/54 (BP Location: Left Arm)   Pulse 70   Temp 98 F (36.7 C) (Oral)   Resp 18   Ht _0  (1.702 m)   Wt 94.1 kg (207 lb 6.4 oz)   SpO2 91%   BMI 32.48 kg/m   Physical Exam  Constitutional: He is oriented to person, place, and time. He appears well-developed and well-nourished.  HENT:  Head: Normocephalic and atraumatic.  Eyes: EOM are  normal. Pupils are equal, round, and reactive to light.  Neck: Normal range of motion. Neck supple.  Cardiovascular: Normal rate and regular rhythm.   Pulmonary/Chest: Effort normal and breath sounds normal. He has no wheezes.  Abdominal: Soft. Bowel sounds are normal. He exhibits no distension and no mass. There is tenderness. There is guarding. There is no rebound.  Musculoskeletal: Normal range of motion. He exhibits no edema or deformity.  Neurological: He is alert and oriented to person, place, and time.  Skin: Skin is warm and dry.  Psychiatric: He has a normal mood and affect. His behavior is normal.   His abdomen is markedly tender right upper quadrant with significant guarding. He has no rebound. I cannot palpate his gallbladder. He does have palpable foreign body in the umbilicus from its repair.  Impression/Plan: I have independently reviewed his CT scan. He is set up for a stress test today. Creatinine is still elevated at 2.1 but he is being rehydrated. Assuming that he continues to progress in a positive manner and his stress test does not prohibitive we will consider surgical intervention for his biliary tract disease. I outlined this plan to the patient in detail and he is in agreement. Wife was present for the interview.   Dia Crawford III, MD  11/05/2015, 9:55 AM

## 2015-11-05 NOTE — Progress Notes (Signed)
[facility] [place] NAME: Brian Casey    MR#:  [id number]  DATE OF BIRTH:  [birth year]  SUBJECTIVE:  CHIEF COMPLAINT:   Chief Complaint  Patient presents with  . Abdominal Pain  . Weakness   Came with abdominal pain and found to have acute cholecystitis, also had acute renal failure and atrial fibrillation with elevated troponin. With IV fluid some improvement in renal function, monitored on telemetry. Heart rate is under control, cardiologist and surgical services saw the patient and plan is to send him for stress test tomorrow.  REVIEW OF SYSTEMS:  CONSTITUTIONAL: No fever, fatigue or weakness.  EYES: No blurred or double vision.  EARS, NOSE, AND THROAT: No tinnitus or ear pain.  RESPIRATORY: No cough, shortness of breath, wheezing or hemoptysis.  CARDIOVASCULAR: No chest pain, orthopnea, edema.  GASTROINTESTINAL: No nausea, vomiting, diarrhea , positive for abdominal pain.  GENITOURINARY: No dysuria, hematuria.  ENDOCRINE: No polyuria, nocturia,  HEMATOLOGY: No anemia, easy bruising or bleeding SKIN: No rash or lesion. MUSCULOSKELETAL: No joint pain or arthritis.   NEUROLOGIC: No tingling, numbness, weakness.  PSYCHIATRY: No anxiety or depression.   ROS  DRUG ALLERGIES:   Allergies  Allergen Reactions  . Bupropion Hcl     REACTION: Severe constipation, insomnia  . Penicillins Other (See Comments)    REACTION: Vomiting Has patient had a PCN reaction causing immediate rash, facial/tongue/throat swelling, SOB or lightheadedness with hypotension: Yes Has patient had a PCN reaction causing severe rash involving mucus membranes or skin necrosis: No Has patient had a PCN reaction that required hospitalization No Has patient had a PCN reaction occurring within the last 10 years: No If all of the above answers are "NO", then may proceed with Cephalosporin use.    VITALS:  Blood pressure (!) 140/52, pulse 69, temperature 98.3 F  (36.8 C), temperature source Oral, resp. rate 17, height _0  (1.702 m), weight 94.1 kg (207 lb 6.4 oz), SpO2 93 %.  PHYSICAL EXAMINATION:  GENERAL:  65 y.o.-year-old patient lying in the bed with no acute distress.  EYES: Pupils equal, round, reactive to light and accommodation. No scleral icterus. Extraocular muscles intact.  HEENT: Head atraumatic, normocephalic. Oropharynx and nasopharynx clear.  NECK:  Supple, no jugular venous distention. No thyroid enlargement, no tenderness.  LUNGS: Normal breath sounds bilaterally, no wheezing, rales,rhonchi or crepitation. No use of accessory muscles of respiration.  CARDIOVASCULAR: S1, S2 irregular. No murmurs, rubs, or gallops.  ABDOMEN: Soft, mild RUQ tender, nondistended. Bowel sounds present. No organomegaly or mass.  EXTREMITIES: No pedal edema, cyanosis, or clubbing.  NEUROLOGIC: Cranial nerves II through XII are intact. Muscle strength 5/5 in all extremities. Sensation intact. Gait not checked.  PSYCHIATRIC: The patient is alert and oriented x 3.  SKIN: No obvious rash, lesion, or ulcer.   Physical Exam LABORATORY PANEL:   CBC  Recent Labs Lab 11/05/15 0450  WBC 13.6*  HGB 12.1*  HCT 34.1*  PLT 128*   ------------------------------------------------------------------------------------------------------------------  Chemistries   Recent Labs Lab 11/04/15 0842 11/05/15 0450  NA  --  125*  K  --  3.7  CL  --  92*  CO2  --  23  GLUCOSE  --  124*  BUN  --  35*  CREATININE  --  2.10*  CALCIUM  --  7.6*  MG 1.9  --   AST  --  17  ALT  --  20  ALKPHOS  --  66  BILITOT  --  1.1   ------------------------------------------------------------------------------------------------------------------  Cardiac Enzymes  Recent Labs Lab 11/04/15 2355 11/05/15 0607  TROPONINI 0.11* 0.04*   ------------------------------------------------------------------------------------------------------------------  RADIOLOGY:  Ct  Abdomen Pelvis Wo Contrast  Result Date: 11/04/2015 CLINICAL DATA:  Right lower quadrant pain extending into the right upper and central abdomen. Symptoms for 2 days. History of hernia repair. EXAM: CT ABDOMEN AND PELVIS WITHOUT CONTRAST TECHNIQUE: Multidetector CT imaging of the abdomen and pelvis was performed following the standard protocol without IV contrast. COMPARISON:  None. FINDINGS: Lower chest: Mild dependent airspace opacities at both lung bases, probably atelectasis. No significant pleural or pericardial effusion. Prominent coronary artery atherosclerosis noted. There is a small hiatal hernia. Hepatobiliary: The hepatic density is diffusely decreased, consistent with steatosis. No focal hepatic abnormalities are identified on noncontrast imaging. The gallbladder is distended with wall thickening and prominent surrounding inflammatory changes. No calcified gallstones are seen. There is no significant biliary dilatation. Pancreas: Unremarkable. No pancreatic ductal dilatation or surrounding inflammatory changes. Spleen: Normal in size without focal abnormality. Adrenals/Urinary Tract: Both adrenal glands appear normal. Both kidneys demonstrate mild cortical thinning and symmetric perinephric soft tissue stranding. 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 No evidence of renal mass, hydronephrosis or urinary tract calculus. The bladder appears unremarkable. Stomach/Bowel: No evidence bowel wall thickening or distention. The right upper quadrant inflammatory changes do not appear to arise from the duodenum. The appendix appears normal. There is mild distal colonic diverticulosis. Vascular/Lymphatic: There are no enlarged abdominal or pelvic lymph nodes. Aortic and branch vessel atherosclerosis noted. Vascular assessment limited without contrast. Reproductive: There is a focal parenchymal calcification within the right aspect of the prostate gland which is not significantly  enlarged. The seminal vesicles appear normal. Other: No evidence of abdominal wall mass or hernia. Musculoskeletal: No acute or significant osseous findings. IMPRESSION: 1. Distended gallbladder with wall thickening and surrounding inflammation highly suspicious for acute cholecystitis. Correlate clinically. No calcified gallstones or biliary dilatation identified. 2. No other acute findings are seen.  The appendix appears normal. 3. Bibasilar atelectasis, hepatic steatosis and mild distal colonic diverticulosis. 4.  Aortic Atherosclerosis (ICD10-170.0) Electronically Signed   By: Richardean Sale M.D.   On: 11/04/2015 10:17   US Renal  Result Date: 11/04/2015 CLINICAL DATA:  Acute renal failure. EXAM: RENAL / URINARY TRACT ULTRASOUND COMPLETE COMPARISON:  None. FINDINGS: Right Kidney: Length: 12.4 cm. Echogenicity within normal limits. No mass or hydronephrosis visualized. Left Kidney: Length: 11.1 cm. Echogenicity within normal limits. No mass or hydronephrosis visualized. Bladder: Appears normal for degree of bladder distention. IMPRESSION: Negative. Electronically Signed   By: Staci Righter M.D.   On: 11/04/2015 17:31   US Abdomen Limited Ruq  Result Date: 11/04/2015 CLINICAL DATA:  Right-sided abdominal pain for 2 days.  Abnormal CT. EXAM: US ABDOMEN LIMITED - RIGHT UPPER QUADRANT COMPARISON:  CT 11/04/2015. FINDINGS: Gallbladder: Gallbladder wall thickening to 7 mm is noted at the gallbladder fundus. Gallbladder sludge is present. No discrete gallstones are seen. Sonographic Percell Miller sign is absent according to the sonographer. Common bile duct: Diameter: 5 mm. Liver: Increased echogenicity corresponding with steatosis on CT. No focal hepatic abnormalities identified. IMPRESSION: 1. Gallbladder wall thickening and sludge without gallstones, sonographic Murphy sign or biliary dilatation. 2. I reviewed the CT from earlier today, and the inflammatory changes on that study are in the right upper quadrant with  the epicenter at the gallbladder. In conjunction with that study, findings still remain concerning for  acalculous cholecystitis. The patient's appendix does lie in the right upper quadrant adjacent to the gallbladder, but appears normal. There is no apparent wall thickening of the duodenum or colon. The pancreas appears normal, and the patient does not have an elevated lipase level, although does have leukocytosis. Electronically Signed   By: Richardean Sale M.D.   On: 11/04/2015 11:35    ASSESSMENT AND PLAN:   Principal Problem:   Cholecystitis Active Problems:   Hypothyroidism   Diabetes ([place])   Anxiety state   NICOTINE ADDICTION   HLD (hyperlipidemia)   Essential hypertension   Atrial fibrillation (HCC)   Acute renal failure (ARF) (HCC)   Elevated troponin I level   Right upper quadrant pain   Coronary artery disease involving native coronary artery of native heart with angina pectoris with documented spasm ([place])   Aortic atherosclerosis ([place])  1. Sepsis due to Acute cholecystitis: Patient presents with tachycardia and leukocytosis . Follow-up on abdominal ultrasound. Continue Zosyn. Follow-up on surgical recommendations,  Surgery after cardiac clearance.  2. New onset atrial fibrillation: Checked TSH and echo. Cardiology consultation appreciated Start diltiazem drip, now on oral cardizem Cardio suspecting CAD- and plan for stress test tomorrow.  3. Hyponatremia: This is likely due to poor by mouth intake over the past several days in addition to EtOH abuse and HCTZ use. Started IV fluids - slow and appropriate improvement.  nephrology consultation.  4. Acute renal failure in the setting of poor by mouth intake and HCTZ and metformin use. Renal ultrasound. IV fluids Nephrology consult appreciated Improving  5. EtOH abuse: Start CIWA protocol. Monitor for EtOH withdrawal.  6. Tobacco dependence: Patient reports she smokes occasionally. I have discussed with the  patient he should stop smoking. Patient counseled 3 minutes.  7. Diabetes: Start sliding scale insulin and hold metformin.  8. Elevated troponin in the setting of sepsis/acute cholecystitis and atrial fibrillation: This is likely due to demand ischemia. Followed troponins and cardiology consult.. Stress test tomorrow.     All the records are reviewed and case discussed with Care Management/Social Workerr. Management plans discussed with the patient, family and they are in agreement.  CODE STATUS: Full  TOTAL TIME TAKING CARE OF THIS PATIENT: 35 minutes.     POSSIBLE D/C IN *1-2 DAYS, DEPENDING ON CLINICAL CONDITION.   [name] M.D on 11/05/2015   Between 7am to 6pm - Pager - [phone]  After 6pm go to [email or web address] - password [email or web address] [facility]  Office  [phone]  CC: Primary care physician; [name], MD  Note: This dictation was prepared with Dragon dictation along with smaller phrase technology. Any transcriptional errors that result from this process are unintentional.

## 2015-11-05 NOTE — Progress Notes (Addendum)
Patient: Brian Casey / Admit Date: 11/04/2015 / Date of Encounter: 11/05/2015, 7:26 AM   Subjective: Converted to sinus rhythm with PACs at 3:00 PM on 11/04/15. Awaiting surgical input. Remains on heparin gtt until surgical plan determined. Leukocytosis improving. Remains hyponatremic. Renal function improving. Still with RUQ pain. No palpitations or SOB. No chest pain. Echo pending.   Review of Systems: Review of Systems  Constitutional: Positive for malaise/fatigue. Negative for chills, diaphoresis, fever and weight loss.  HENT: Negative for congestion.   Eyes: Negative for discharge and redness.  Respiratory: Negative for cough, sputum production, shortness of breath and wheezing.   Cardiovascular: Negative for chest pain, palpitations, orthopnea, claudication, leg swelling and PND.  Gastrointestinal: Positive for abdominal pain and nausea. Negative for heartburn and vomiting.  Musculoskeletal: Negative for falls and myalgias.  Skin: Negative for rash.  Neurological: Negative for dizziness, tingling, tremors, sensory change, speech change, focal weakness, loss of consciousness and weakness.  Endo/Heme/Allergies: Does not bruise/bleed easily.  Psychiatric/Behavioral: Negative for substance abuse. The patient is not nervous/anxious.   All other systems reviewed and are negative.   Objective: Telemetry: Afib until 3:00 PM on 8./14/17. Sinus rhythm, since  Physical Exam: Blood pressure (!) 135/52, pulse 77, temperature 98 F (36.7 C), temperature source Oral, resp. rate 18, height _0  (1.702 m), weight 207 lb 6.4 oz (94.1 kg), SpO2 91 %. Body mass index is 32.48 kg/m. General: Well developed, well nourished, in no acute distress. Head: Normocephalic, atraumatic, sclera non-icteric, no xanthomas, nares are without discharge. Neck: Negative for carotid bruits. JVP not elevated. Lungs: Clear bilaterally to auscultation without wheezes, rales, or rhonchi. Breathing is  unlabored. Heart: RRR S1 S2, II/VI systolic murmur LUSB, no rubs, or gallops.  Abdomen: Soft, tender RUQ, non-distended with normoactive bowel sounds. No rebound/guarding. Extremities: No clubbing or cyanosis. No edema. Distal pedal pulses are 2+ and equal bilaterally. Neuro: Alert and oriented X 3. Moves all extremities spontaneously. Psych:  Responds to questions appropriately with a normal affect.   Intake/Output Summary (Last 24 hours) at 11/05/15 0726 Last data filed at 11/05/15 4259  Gross per 24 hour  Intake          2356.23 ml  Output              460 ml  Net          1896.23 ml    Inpatient Medications:  . folic acid  1 mg Oral Daily  . insulin aspart  0-5 Units Subcutaneous QHS  . insulin aspart  0-9 Units Subcutaneous TID WC  . levothyroxine  100 mcg Oral QAC breakfast  . LORazepam  0-4 mg Oral Q6H   Followed by  . [START ON 11/06/2015] LORazepam  0-4 mg Oral Q12H  . multivitamin with minerals  1 tablet Oral Daily  . pantoprazole  40 mg Oral Daily  . piperacillin-tazobactam (ZOSYN)  IV  3.375 g Intravenous Q8H  . simvastatin  40 mg Oral QHS  . sodium chloride flush  3 mL Intravenous Q12H  . thiamine  100 mg Oral Daily   Or  . thiamine  100 mg Intravenous Daily   Infusions:  . sodium chloride 100 mL/hr at 11/05/15 0051  . diltiazem (CARDIZEM) infusion 14 mg/hr (11/05/15 0526)  . heparin 1,550 Units/hr (11/05/15 5638)    Labs:  Recent Labs  11/04/15 0841 11/04/15 0842 11/05/15 0450  NA 121*  --  125*  K 4.8  --  3.7  CL 87*  --  92*  CO2 22  --  23  GLUCOSE 209*  --  124*  BUN 31*  --  35*  CREATININE 2.40*  --  2.10*  CALCIUM 8.7*  --  7.6*  MG  --  1.9  --     Recent Labs  11/04/15 0841 11/05/15 0450  AST 28 17  ALT 22 20  ALKPHOS 55 66  BILITOT 1.8* 1.1  PROT 6.9 5.6*  ALBUMIN 3.5 2.7*    Recent Labs  11/04/15 0841 11/05/15 0450  WBC 24.2* 13.6*  NEUTROABS 22.6*  --   HGB 14.6 12.1*  HCT 40.9 34.1*  MCV 96.2 95.8  PLT 176 128*     Recent Labs  11/04/15 0841 11/04/15 1257 11/04/15 2355  TROPONINI 0.27* 0.04* 0.11*   Invalid input(s): POCBNP  Recent Labs  11/04/15 1257  HGBA1C 6.4*     Weights: Filed Weights   11/04/15 0839 11/04/15 1230  Weight: 210 lb (95.3 kg) 207 lb 6.4 oz (94.1 kg)     Radiology/Studies:  Ct Abdomen Pelvis Wo Contrast  Result Date: 11/04/2015 CLINICAL DATA:  Right lower quadrant pain extending into the right upper and central abdomen. Symptoms for 2 days. History of hernia repair. EXAM: CT ABDOMEN AND PELVIS WITHOUT CONTRAST TECHNIQUE: Multidetector CT imaging of the abdomen and pelvis was performed following the standard protocol without IV contrast. COMPARISON:  None. FINDINGS: Lower chest: Mild dependent airspace opacities at both lung bases, probably atelectasis. No significant pleural or pericardial effusion. Prominent coronary artery atherosclerosis noted. There is a small hiatal hernia. Hepatobiliary: The hepatic density is diffusely decreased, consistent with steatosis. No focal hepatic abnormalities are identified on noncontrast imaging. The gallbladder is distended with wall thickening and prominent surrounding inflammatory changes. No calcified gallstones are seen. There is no significant biliary dilatation. Pancreas: Unremarkable. No pancreatic ductal dilatation or surrounding inflammatory changes. Spleen: Normal in size without focal abnormality. Adrenals/Urinary Tract: Both adrenal glands appear normal. Both kidneys demonstrate mild cortical thinning and symmetric perinephric soft tissue stranding. 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 No evidence of renal mass, hydronephrosis or urinary tract calculus. The bladder appears unremarkable. Stomach/Bowel: No evidence bowel wall thickening or distention. The right upper quadrant inflammatory changes do not appear to arise from the duodenum. The appendix appears normal. There is mild distal colonic  diverticulosis. Vascular/Lymphatic: There are no enlarged abdominal or pelvic lymph nodes. Aortic and branch vessel atherosclerosis noted. Vascular assessment limited without contrast. Reproductive: There is a focal parenchymal calcification within the right aspect of the prostate gland which is not significantly enlarged. The seminal vesicles appear normal. Other: No evidence of abdominal wall mass or hernia. Musculoskeletal: No acute or significant osseous findings. IMPRESSION: 1. Distended gallbladder with wall thickening and surrounding inflammation highly suspicious for acute cholecystitis. Correlate clinically. No calcified gallstones or biliary dilatation identified. 2. No other acute findings are seen.  The appendix appears normal. 3. Bibasilar atelectasis, hepatic steatosis and mild distal colonic diverticulosis. 4.  Aortic Atherosclerosis (ICD10-170.0) Electronically Signed   By: Richardean Sale M.D.   On: 11/04/2015 10:17   US Renal  Result Date: 11/04/2015 CLINICAL DATA:  Acute renal failure. EXAM: RENAL / URINARY TRACT ULTRASOUND COMPLETE COMPARISON:  None. FINDINGS: Right Kidney: Length: 12.4 cm. Echogenicity within normal limits. No mass or hydronephrosis visualized. Left Kidney: Length: 11.1 cm. Echogenicity within normal limits. No mass or hydronephrosis visualized. Bladder: Appears normal for degree of bladder distention. IMPRESSION: Negative. Electronically Signed  By: Staci Righter M.D.   On: 11/04/2015 17:31   US Abdomen Limited Ruq  Result Date: 11/04/2015 CLINICAL DATA:  Right-sided abdominal pain for 2 days.  Abnormal CT. EXAM: US ABDOMEN LIMITED - RIGHT UPPER QUADRANT COMPARISON:  CT 11/04/2015. FINDINGS: Gallbladder: Gallbladder wall thickening to 7 mm is noted at the gallbladder fundus. Gallbladder sludge is present. No discrete gallstones are seen. Sonographic Percell Miller sign is absent according to the sonographer. Common bile duct: Diameter: 5 mm. Liver: Increased echogenicity  corresponding with steatosis on CT. No focal hepatic abnormalities identified. IMPRESSION: 1. Gallbladder wall thickening and sludge without gallstones, sonographic Murphy sign or biliary dilatation. 2. I reviewed the CT from earlier today, and the inflammatory changes on that study are in the right upper quadrant with the epicenter at the gallbladder. In conjunction with that study, findings still remain concerning for acalculous cholecystitis. The patient's appendix does lie in the right upper quadrant adjacent to the gallbladder, but appears normal. There is no apparent wall thickening of the duodenum or colon. The pancreas appears normal, and the patient does not have an elevated lipase level, although does have leukocytosis. Electronically Signed   By: Richardean Sale M.D.   On: 11/04/2015 11:35     Assessment and Plan  Principal Problem:   Cholecystitis Active Problems:   Atrial fibrillation (HCC)   Hypothyroidism   Diabetes ([place])   Essential hypertension   Anxiety state   NICOTINE ADDICTION   HLD (hyperlipidemia)   Acute renal failure (ARF) (HCC)   Elevated troponin I level   Right upper quadrant pain   Coronary artery disease involving native coronary artery of native heart with angina pectoris with documented spasm ([place])   Aortic atherosclerosis ([place])    1. New onset Afib with RVR:  -Converted to sinus rhythm at 3:00 PM on 8/14 and has remained in sinus since -Likely in the setting of his RUQ pain and acute acalculus cholecystitis  -Rate control as BP allows -Convert diltiazem gtt to PO given he is in sinus rhythm -CHADS2VASc at least 2 (HTN, DM)  -Continue heparin gtt for anticoagulation given CHADS2VASc above  -Will need DOAC after surgical plan is determined per evidence based medicine   2. Elevated troponin:  -Never with chest pain -Trend of 0.27-->0.04-->0.11 -Likely in the setting of the patient's Afib with RVR with heart rates into the 170's bpm and sepsis with  acalculus cholecystitis  -Check echo  -Can plan for nuclear stress testing when the above is resolved given Afib onset and troponin elevation  3. HTN:  -Stable -Per IM   4. ETOH abuse:  -Per IM   5. HLD:  -Statin   6. Acute cholecystitis: -Per primary team  7. AKI: -Improving   Signed, Marcille Blanco [place] Pager: [phone] 11/05/2015, 7:26 AM   Attending Note Patient seen and examined, agree with detailed note above,  Patient presentation and plan discussed on rounds.   Asymptomatic overnight, hungry Wife at the bedside Long discussion concerning findings on CT scan coronary artery disease, aortic atherosclerosis Also discussed elevated troponin in the setting of atrial fibrillation with RVR  Telemetry reviewed showing normal sinus rhythm since 3 PM yesterday Lab work reviewed showing improved sodium up to 125, improved creatinine down to 2.0 Continues on IV fluids, diltiazem infusion, heparin  Clinical exam lungs are clear, heart sounds regular, no JVP, abdomen soft and nontender, no significant lower extremity edema   ----Preop cardiovascular At the very least will need stress Myoview  Could potentially schedule this for tomorrow Ideally if surgery was not indicated, would perform cardiac catheterization given heavy coronary calcifications and elevated troponin though stent placement would delay surgery for quite some time. Patient does not want this -Risk stratification with stress test above was discussed with wife and patient  Also discussed low sodium level, kidney failure, both showing slow improvement  Greater than 50% was spent in counseling and coordination of care with patient Total encounter time 35 minutes or more   Signed: Esmond Plants  M.D., Ph.D. [facility]

## 2015-11-05 NOTE — Progress Notes (Addendum)
Per dr. Rockey Situ, stress test tomorrow morning and not today since sodium still low. Surgery would be best scheduled after tomorrow am stress test.  Pt updated on this information as well, amenable to plan as long as he can eat today. Informed pt can eat and will be npo after midnight tonight.

## 2015-11-05 NOTE — Progress Notes (Signed)
[facility]  ROUNDING NOTE   Subjective:  Creatinine has improved slightly and is currently down to 2.1. He is awaiting a stress test prior to further surgical intervention. WBC count has come down a bit to 13.6.    Objective:  Vital signs in last 24 hours:  Temp:  [97.6 F (36.4 C)-99.1 F (37.3 C)] 98.3 F (36.8 C) (08/15 1139) Pulse Rate:  [69-108] 69 (08/15 1139) Resp:  [16-20] 17 (08/15 1139) BP: (120-140)/(52-67) 140/52 (08/15 1139) SpO2:  [91 %-100 %] 93 % (08/15 1139) Weight:  [94.1 kg (207 lb 6.4 oz)] 94.1 kg (207 lb 6.4 oz) (08/14 1230)  Weight change:  Filed Weights   11/04/15 0839 11/04/15 1230  Weight: 95.3 kg (210 lb) 94.1 kg (207 lb 6.4 oz)    Intake/Output: I/O last 3 completed shifts: In: 2356.2 [I.V.:2006.2; IV Piggyback:350] Out: 460 [Urine:460]   Intake/Output this shift:  Total I/O In: -  Out: 1050 [Urine:1050]  Physical Exam: General: No acute distress  Head: Normocephalic, atraumatic. Moist oral mucosal membranes  Eyes: Anicteric  Neck: Supple, trachea midline  Lungs:  Clear to auscultation, normal effort  Heart: S1S2 no rubs  Abdomen:  Soft, Right upper quadrant tenderness noted with rebound.   Extremities: No peripheral edema.  Neurologic: Nonfocal, moving all four extremities  Skin: No lesions  Access:     Basic Metabolic Panel:  Recent Labs Lab 11/04/15 0841 11/04/15 0842 11/05/15 0450  NA 121*  --  125*  K 4.8  --  3.7  CL 87*  --  92*  CO2 22  --  23  GLUCOSE 209*  --  124*  BUN 31*  --  35*  CREATININE 2.40*  --  2.10*  CALCIUM 8.7*  --  7.6*  MG  --  1.9  --     Liver Function Tests:  Recent Labs Lab 11/04/15 0841 11/05/15 0450  AST 28 17  ALT 22 20  ALKPHOS 55 66  BILITOT 1.8* 1.1  PROT 6.9 5.6*  ALBUMIN 3.5 2.7*    Recent Labs Lab 11/04/15 0841  LIPASE 18   No results for input(s): AMMONIA in the last 168 hours.  CBC:  Recent Labs Lab 11/04/15 0841 11/05/15 0450  WBC 24.2*  13.6*  NEUTROABS 22.6*  --   HGB 14.6 12.1*  HCT 40.9 34.1*  MCV 96.2 95.8  PLT 176 128*    Cardiac Enzymes:  Recent Labs Lab 11/04/15 0841 11/04/15 1257 11/04/15 2355 11/05/15 0607  TROPONINI 0.27* 0.04* 0.11* 0.04*    BNP: Invalid input(s): POCBNP  CBG:  Recent Labs Lab 11/04/15 1606 11/04/15 2104 11/05/15 0724  GLUCAP 114* 131* 147*    Microbiology: Results for orders placed or performed in visit on 02/10/11  Fecal occult blood, imunochemical     Status: None   Collection Time: 02/10/11 11:21 AM  Result Value Ref Range Status   Fecal Occult Bld Negative Negative Final    Coagulation Studies:  Recent Labs  11/04/15 0841  LABPROT 14.7  INR 1.14    Urinalysis: No results for input(s): COLORURINE, LABSPEC, PHURINE, GLUCOSEU, HGBUR, BILIRUBINUR, KETONESUR, PROTEINUR, UROBILINOGEN, NITRITE, LEUKOCYTESUR in the last 72 hours.  Invalid input(s): APPERANCEUR    Imaging: Ct Abdomen Pelvis Wo Contrast  Result Date: 11/04/2015 CLINICAL DATA:  Right lower quadrant pain extending into the right upper and central abdomen. Symptoms for 2 days. History of hernia repair. EXAM: CT ABDOMEN AND PELVIS WITHOUT CONTRAST TECHNIQUE: Multidetector CT imaging of the abdomen and  pelvis was performed following the standard protocol without IV contrast. COMPARISON:  None. FINDINGS: Lower chest: Mild dependent airspace opacities at both lung bases, probably atelectasis. No significant pleural or pericardial effusion. Prominent coronary artery atherosclerosis noted. There is a small hiatal hernia. Hepatobiliary: The hepatic density is diffusely decreased, consistent with steatosis. No focal hepatic abnormalities are identified on noncontrast imaging. The gallbladder is distended with wall thickening and prominent surrounding inflammatory changes. No calcified gallstones are seen. There is no significant biliary dilatation. Pancreas: Unremarkable. No pancreatic ductal dilatation or  surrounding inflammatory changes. Spleen: Normal in size without focal abnormality. Adrenals/Urinary Tract: Both adrenal glands appear normal. Both kidneys demonstrate mild cortical thinning and symmetric perinephric soft tissue stranding. 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 No evidence of renal mass, hydronephrosis or urinary tract calculus. The bladder appears unremarkable. Stomach/Bowel: No evidence bowel wall thickening or distention. The right upper quadrant inflammatory changes do not appear to arise from the duodenum. The appendix appears normal. There is mild distal colonic diverticulosis. Vascular/Lymphatic: There are no enlarged abdominal or pelvic lymph nodes. Aortic and branch vessel atherosclerosis noted. Vascular assessment limited without contrast. Reproductive: There is a focal parenchymal calcification within the right aspect of the prostate gland which is not significantly enlarged. The seminal vesicles appear normal. Other: No evidence of abdominal wall mass or hernia. Musculoskeletal: No acute or significant osseous findings. IMPRESSION: 1. Distended gallbladder with wall thickening and surrounding inflammation highly suspicious for acute cholecystitis. Correlate clinically. No calcified gallstones or biliary dilatation identified. 2. No other acute findings are seen.  The appendix appears normal. 3. Bibasilar atelectasis, hepatic steatosis and mild distal colonic diverticulosis. 4.  Aortic Atherosclerosis (ICD10-170.0) Electronically Signed   By: Richardean Sale M.D.   On: 11/04/2015 10:17   US Renal  Result Date: 11/04/2015 CLINICAL DATA:  Acute renal failure. EXAM: RENAL / URINARY TRACT ULTRASOUND COMPLETE COMPARISON:  None. FINDINGS: Right Kidney: Length: 12.4 cm. Echogenicity within normal limits. No mass or hydronephrosis visualized. Left Kidney: Length: 11.1 cm. Echogenicity within normal limits. No mass or hydronephrosis visualized. Bladder:  Appears normal for degree of bladder distention. IMPRESSION: Negative. Electronically Signed   By: Staci Righter M.D.   On: 11/04/2015 17:31   US Abdomen Limited Ruq  Result Date: 11/04/2015 CLINICAL DATA:  Right-sided abdominal pain for 2 days.  Abnormal CT. EXAM: US ABDOMEN LIMITED - RIGHT UPPER QUADRANT COMPARISON:  CT 11/04/2015. FINDINGS: Gallbladder: Gallbladder wall thickening to 7 mm is noted at the gallbladder fundus. Gallbladder sludge is present. No discrete gallstones are seen. Sonographic Percell Miller sign is absent according to the sonographer. Common bile duct: Diameter: 5 mm. Liver: Increased echogenicity corresponding with steatosis on CT. No focal hepatic abnormalities identified. IMPRESSION: 1. Gallbladder wall thickening and sludge without gallstones, sonographic Murphy sign or biliary dilatation. 2. I reviewed the CT from earlier today, and the inflammatory changes on that study are in the right upper quadrant with the epicenter at the gallbladder. In conjunction with that study, findings still remain concerning for acalculous cholecystitis. The patient's appendix does lie in the right upper quadrant adjacent to the gallbladder, but appears normal. There is no apparent wall thickening of the duodenum or colon. The pancreas appears normal, and the patient does not have an elevated lipase level, although does have leukocytosis. Electronically Signed   By: Richardean Sale M.D.   On: 11/04/2015 11:35     Medications:   . sodium chloride 100 mL/hr at 11/05/15 1100  .  heparin 1,550 Units/hr (11/05/15 6629)   . atorvastatin  40 mg Oral q1800  . diltiazem  30 mg Oral U7M  . folic acid  1 mg Oral Daily  . insulin aspart  0-5 Units Subcutaneous QHS  . insulin aspart  0-9 Units Subcutaneous TID WC  . levothyroxine  100 mcg Oral QAC breakfast  . LORazepam  0-4 mg Oral Q6H   Followed by  . [START ON 11/06/2015] LORazepam  0-4 mg Oral Q12H  . multivitamin with minerals  1 tablet Oral Daily  .  pantoprazole  40 mg Oral Daily  . piperacillin-tazobactam (ZOSYN)  IV  3.375 g Intravenous Q8H  . potassium chloride  20 mEq Oral TID  . sodium chloride flush  3 mL Intravenous Q12H  . thiamine  100 mg Oral Daily   Or  . thiamine  100 mg Intravenous Daily   acetaminophen **OR** acetaminophen, HYDROcodone-homatropine, ketorolac, LORazepam **OR** LORazepam, ondansetron **OR** ondansetron (ZOFRAN) IV, senna-docusate, traMADol  Assessment/ Plan:  65 y.o. male with a PMHx of GERD, hyperlipidemia, hypertension, hypothyroidism, diabetes mellitus type 2, who was admitted to [facility] on 11/04/2015 for evaluation of abdominal pain. Patient reports that the symptoms began this past Saturday.  1.  Acute renal failure/chronic kidney disease stage II baseline creatinine 1.1 EGFR 68.  Acute renal failure secondary to sepsis from cholecystitis, use of BC powder, poor by mouth intake and losartan/hydrochlorothiazide.  CT scan of the abdomen and pelvis revealed bilateral renal cortical thinning but no hydronephrosis.   -  Creatinine slightly down to 2.10. Continue IV fluid hydration for now. No indication for dialysis at the moment.  2.  Hyponatremia.   serum sodium up to 125. Continue IV fluid hydration with 0.9 normal saline for now.  3.  Acute cholecystitis.   patient currently receiving Zosyn. He is undergoing a stress test prior to any surgical intervention as well.   LOS: 1 Divine Imber 8/15/201711:49 AM

## 2015-11-05 NOTE — Progress Notes (Signed)
ANTICOAGULATION CONSULT NOTE - Initial Consult  Pharmacy Consult for Heparin drip Indication: chest pain/ACS and atrial fibrillation  Allergies  Allergen Reactions  . Bupropion Hcl     REACTION: Severe constipation, insomnia  . Penicillins Other (See Comments)    REACTION: Vomiting Has patient had a PCN reaction causing immediate rash, facial/tongue/throat swelling, SOB or lightheadedness with hypotension: Yes Has patient had a PCN reaction causing severe rash involving mucus membranes or skin necrosis: No Has patient had a PCN reaction that required hospitalization No Has patient had a PCN reaction occurring within the last 10 years: No If all of the above answers are "NO", then may proceed with Cephalosporin use.    Patient Measurements: Height: _0  (170.2 cm) Weight: 207 lb 6.4 oz (94.1 kg) IBW/kg (Calculated) : 66.1 Heparin Dosing Weight: 86.4 kg  Vital Signs: Temp: 98.3 F (36.8 C) (08/15 1139) Temp Source: Oral (08/15 1139) BP: 140/52 (08/15 1139) Pulse Rate: 69 (08/15 1139)  Labs:  Recent Labs  11/04/15 0841 11/04/15 1257 11/04/15 1934 11/04/15 2355 11/05/15 0450 11/05/15 0607 11/05/15 1402  HGB 14.6  --   --   --  12.1*  --   --   HCT 40.9  --   --   --  34.1*  --   --   PLT 176  --   --   --  128*  --   --   APTT 27  --   --   --   --   --   --   LABPROT 14.7  --   --   --   --   --   --   INR 1.14  --   --   --   --   --   --   HEPARINUNFRC  --   --  <0.10*  --  0.29*  --  0.19*  CREATININE 2.40*  --   --   --  2.10*  --   --   TROPONINI 0.27* 0.04*  --  0.11*  --  0.04*  --     Estimated Creatinine Clearance: 39.4 mL/min (by C-G formula based on SCr of 2.1 mg/dL).    Assessment: 65 yo male with elevated troponin and AFib with RVR. No anticoagulants noted in prior to admission med list. Spoke with ED MD, would NOT like a heparin bolus at this time as pt may need surgery; would just like the maintenance drip started.   Hgb 14.6, Plt 176 APTT and  INR pending  Goal of Therapy:  Heparin level 0.3-0.7 units/ml Monitor platelets by anticoagulation protocol: Yes   Plan:  Heparin drip at 1100 units/hr (=11 ml/hr) Heparin level in 6h at 1800  CBC in AM  8/14:  HL @ 19:30 = < 0.1  Will order Heparin 2600 units IV X 1 bolus and increase drip rate to 1400 units/hr.  Will order HL 6 hrs after rate change.    6967 11/05/2015 heparin level subtherapeutic. Increase rate to 1550 units/hr. Will recheck heparin level in 6 hours.  1402 8/15 Hl=0.19 subtherapeutic. Spoke with RN who states drip has not been turned off today and is running at 15.88m/hr.  Will bolus 2600 units and increase to 1800 units/hr. Recheck level in 6 hours.  MRamond Dial Pharm.D Clinical Pharmacist 11/05/2015  2:52 PM

## 2015-11-06 ENCOUNTER — Encounter
Admission: EM | Disposition: A | Discharge status: Other Acute Inpatient Hospital | Payer: Self-pay | Source: Home / Self Care | Attending: Internal Medicine

## 2015-11-06 ENCOUNTER — Encounter: Payer: Self-pay | Admitting: Anesthesiology

## 2015-11-06 ENCOUNTER — Inpatient Hospital Stay (HOSPITAL_BASED_OUTPATIENT_CLINIC_OR_DEPARTMENT_OTHER): Payer: Managed Care, Other (non HMO)

## 2015-11-06 DIAGNOSIS — Z01818 Encounter for other preprocedural examination: Secondary | ICD-10-CM | POA: Diagnosis not present

## 2015-11-06 DIAGNOSIS — I214 Non-ST elevation (NSTEMI) myocardial infarction: Secondary | ICD-10-CM

## 2015-11-06 DIAGNOSIS — N179 Acute kidney failure, unspecified: Secondary | ICD-10-CM

## 2015-11-06 DIAGNOSIS — I4891 Unspecified atrial fibrillation: Secondary | ICD-10-CM

## 2015-11-06 DIAGNOSIS — F321 Major depressive disorder, single episode, moderate: Secondary | ICD-10-CM

## 2015-11-06 DIAGNOSIS — E785 Hyperlipidemia, unspecified: Secondary | ICD-10-CM

## 2015-11-06 DIAGNOSIS — F4321 Adjustment disorder with depressed mood: Secondary | ICD-10-CM

## 2015-11-06 DIAGNOSIS — I25119 Atherosclerotic heart disease of native coronary artery with unspecified angina pectoris: Secondary | ICD-10-CM

## 2015-11-06 DIAGNOSIS — F411 Generalized anxiety disorder: Secondary | ICD-10-CM

## 2015-11-06 DIAGNOSIS — R9439 Abnormal result of other cardiovascular function study: Secondary | ICD-10-CM

## 2015-11-06 HISTORY — PX: CARDIAC CATHETERIZATION: SHX172

## 2015-11-06 HISTORY — DX: Acute kidney failure, unspecified: N17.9

## 2015-11-06 HISTORY — DX: Unspecified atrial fibrillation: I48.91

## 2015-11-06 LAB — NM MYOCAR MULTI W/SPECT W/WALL MOTION / EF
CSEPHR: 65 %
CSEPPHR: 103 {beats}/min
LVDIAVOL: 111 mL (ref 62–150)
LVSYSVOL: 39 mL
Rest HR: 80 {beats}/min
SDS: 7
SRS: 3
SSS: 10
TID: 1.09

## 2015-11-06 LAB — BASIC METABOLIC PANEL
ANION GAP: 6 (ref 5–15)
BUN: 24 mg/dL — ABNORMAL HIGH (ref 6–20)
CHLORIDE: 97 mmol/L — AB (ref 101–111)
CO2: 24 mmol/L (ref 22–32)
Calcium: 8 mg/dL — ABNORMAL LOW (ref 8.9–10.3)
Creatinine, Ser: 1.75 mg/dL — ABNORMAL HIGH (ref 0.61–1.24)
GFR calc Af Amer: 46 mL/min — ABNORMAL LOW (ref >60)
GFR calc non Af Amer: 40 mL/min — ABNORMAL LOW (ref >60)
GLUCOSE: 177 mg/dL — AB (ref 65–99)
POTASSIUM: 4 mmol/L (ref 3.5–5.1)
Sodium: 127 mmol/L — ABNORMAL LOW (ref 135–145)

## 2015-11-06 LAB — CBC
HEMATOCRIT: 36 % — AB (ref 40.0–52.0)
HEMOGLOBIN: 12.5 g/dL — AB (ref 13.0–18.0)
MCH: 33.6 pg (ref 26.0–34.0)
MCHC: 34.6 g/dL (ref 32.0–36.0)
MCV: 97 fL (ref 80.0–100.0)
Platelets: 144 10*3/uL — ABNORMAL LOW (ref 150–440)
RBC: 3.71 MIL/uL — ABNORMAL LOW (ref 4.40–5.90)
RDW: 13.2 % (ref 11.5–14.5)
WBC: 12.7 10*3/uL — ABNORMAL HIGH (ref 3.8–10.6)

## 2015-11-06 LAB — HEPARIN LEVEL (UNFRACTIONATED): Heparin Unfractionated: 0.42 IU/mL (ref 0.30–0.70)

## 2015-11-06 LAB — GLUCOSE, CAPILLARY
GLUCOSE-CAPILLARY: 170 mg/dL — AB (ref 65–99)
Glucose-Capillary: 161 mg/dL — ABNORMAL HIGH (ref 65–99)
Glucose-Capillary: 106 mg/dL — ABNORMAL HIGH (ref 65–99)
Glucose-Capillary: 120 mg/dL — ABNORMAL HIGH (ref 65–99)

## 2015-11-06 SURGERY — LAPAROSCOPIC CHOLECYSTECTOMY
Anesthesia: Choice

## 2015-11-06 SURGERY — LEFT HEART CATH AND CORONARY ANGIOGRAPHY
Anesthesia: Moderate Sedation | Laterality: Right

## 2015-11-06 MED ORDER — AMIODARONE HCL IN DEXTROSE 360-4.14 MG/200ML-% IV SOLN
60.0000 mg/h | INTRAVENOUS | Status: DC
Start: 2015-11-06 — End: 2015-11-07
  Administered 2015-11-06 (×2): 60 mg/h via INTRAVENOUS
  Filled 2015-11-06: qty 200

## 2015-11-06 MED ORDER — ESCITALOPRAM OXALATE 10 MG PO TABS
10.0000 mg | ORAL_TABLET | Freq: Every day | ORAL | Status: DC
  Administered 2015-11-06 – 2015-11-07 (×2): 10 mg via ORAL
  Filled 2015-11-06 (×2): qty 1

## 2015-11-06 MED ORDER — MIDAZOLAM HCL 2 MG/2ML IJ SOLN
INTRAMUSCULAR | Status: DC | PRN
  Administered 2015-11-06: 1 mg via INTRAVENOUS

## 2015-11-06 MED ORDER — PIPERACILLIN-TAZOBACTAM 3.375 G IVPB: 3.3750 g | Freq: Three times a day (TID) | INTRAVENOUS | 0 refills | Status: DC

## 2015-11-06 MED ORDER — TECHNETIUM TC 99M TETROFOSMIN IV KIT
31.6600 | PACK | Freq: Once | INTRAVENOUS | Status: AC | PRN
  Administered 2015-11-06: 31.66 via INTRAVENOUS

## 2015-11-06 MED ORDER — AMIODARONE HCL IN DEXTROSE 360-4.14 MG/200ML-% IV SOLN
30.0000 mg/h | INTRAVENOUS | Status: DC
  Filled 2015-11-06 (×5): qty 200

## 2015-11-06 MED ORDER — AMIODARONE HCL 200 MG PO TABS: 200.0000 mg | ORAL_TABLET | Freq: Every day | ORAL | 0 refills | Status: DC

## 2015-11-06 MED ORDER — HEPARIN (PORCINE) IN NACL 2-0.9 UNIT/ML-% IJ SOLN
INTRAMUSCULAR | Status: AC
  Filled 2015-11-06: qty 500

## 2015-11-06 MED ORDER — AMIODARONE LOAD VIA INFUSION
150.0000 mg | Freq: Once | INTRAVENOUS | Status: AC
  Administered 2015-11-06: 150 mg via INTRAVENOUS
  Filled 2015-11-06: qty 83.34

## 2015-11-06 MED ORDER — SODIUM CHLORIDE 0.9 % WEIGHT BASED INFUSION: 3.0000 mL/kg/h | INTRAVENOUS | Status: DC

## 2015-11-06 MED ORDER — ASPIRIN 81 MG PO CHEW: 81.0000 mg | CHEWABLE_TABLET | ORAL | 0 refills | Status: DC

## 2015-11-06 MED ORDER — SODIUM CHLORIDE 0.9 % WEIGHT BASED INFUSION: 1.0000 mL/kg/h | INTRAVENOUS | Status: DC

## 2015-11-06 MED ORDER — TEMAZEPAM 15 MG PO CAPS: 15.0000 mg | ORAL_CAPSULE | Freq: Every evening | ORAL | Status: DC | PRN

## 2015-11-06 MED ORDER — BUPIVACAINE HCL (PF) 0.25 % IJ SOLN
INTRAMUSCULAR | Status: AC
  Filled 2015-11-06: qty 30

## 2015-11-06 MED ORDER — ASPIRIN 81 MG PO CHEW: 81.0000 mg | CHEWABLE_TABLET | ORAL | Status: DC

## 2015-11-06 MED ORDER — TECHNETIUM TC 99M TETROFOSMIN IV KIT
12.2600 | PACK | Freq: Once | INTRAVENOUS | Status: AC | PRN
  Administered 2015-11-06: 12.26 via INTRAVENOUS

## 2015-11-06 MED ORDER — REGADENOSON 0.4 MG/5ML IV SOLN
0.4000 mg | Freq: Once | INTRAVENOUS | Status: AC
  Administered 2015-11-06: 0.4 mg via INTRAVENOUS

## 2015-11-06 MED ORDER — LORAZEPAM 2 MG/ML IJ SOLN
0.5000 mg | Freq: Four times a day (QID) | INTRAMUSCULAR | Status: DC | PRN
  Administered 2015-11-06: 0.5 mg via INTRAVENOUS
  Filled 2015-11-06: qty 1

## 2015-11-06 MED ORDER — IOPAMIDOL (ISOVUE-300) INJECTION 61%
INTRAVENOUS | Status: DC | PRN
  Administered 2015-11-06: 210 mL via INTRA_ARTERIAL

## 2015-11-06 MED ORDER — HEPARIN SODIUM (PORCINE) 5000 UNIT/ML IJ SOLN
INTRAMUSCULAR | Status: AC
  Filled 2015-11-06: qty 1

## 2015-11-06 MED ORDER — DILTIAZEM HCL 30 MG PO TABS: 30.0000 mg | ORAL_TABLET | Freq: Four times a day (QID) | ORAL | 0 refills | Status: DC

## 2015-11-06 MED ORDER — FENTANYL CITRATE (PF) 100 MCG/2ML IJ SOLN
INTRAMUSCULAR | Status: AC
  Filled 2015-11-06: qty 2

## 2015-11-06 MED ORDER — MIDAZOLAM HCL 2 MG/2ML IJ SOLN
INTRAMUSCULAR | Status: AC
  Filled 2015-11-06: qty 2

## 2015-11-06 SURGICAL SUPPLY — 13 items
CATH AMP RT 5F (CATHETERS) ×3 IMPLANT
CATH INFINITI 5 FR 3DRC (CATHETERS) ×3 IMPLANT
CATH INFINITI 5 FR MPA2 (CATHETERS) ×3 IMPLANT
CATH INFINITI 5FR AL1 (CATHETERS) ×3 IMPLANT
CATH INFINITI 5FR ANG PIGTAIL (CATHETERS) ×3 IMPLANT
CATH INFINITI 5FR JL4 (CATHETERS) ×3 IMPLANT
CATH INFINITI JR4 5F (CATHETERS) ×3 IMPLANT
DEVICE CLOSURE MYNXGRIP 5F (Vascular Products) ×3 IMPLANT
KIT MANI 3VAL PERCEP (MISCELLANEOUS) ×3 IMPLANT
NEEDLE PERC 18GX7CM (NEEDLE) ×3 IMPLANT
PACK CARDIAC CATH (CUSTOM PROCEDURE TRAY) ×3 IMPLANT
SHEATH AVANTI 5FR X 11CM (SHEATH) ×3 IMPLANT
WIRE EMERALD 3MM-J .035X150CM (WIRE) ×3 IMPLANT

## 2015-11-06 SURGICAL SUPPLY — 39 items
APPLIER CLIP ROT 10 11.4 M/L (STAPLE)
BAG COUNTER SPONGE EZ (MISCELLANEOUS) IMPLANT
CANISTER SUCT 1200ML W/VALVE (MISCELLANEOUS) IMPLANT
CATH REDDICK CHOLANGI 4FR 50CM (CATHETERS) IMPLANT
CHLORAPREP W/TINT 26ML (MISCELLANEOUS) IMPLANT
CLIP APPLIE ROT 10 11.4 M/L (STAPLE) IMPLANT
CONRAY 60ML FOR OR (MISCELLANEOUS) IMPLANT
DRAPE SHEET LG 3/4 BI-LAMINATE (DRAPES) IMPLANT
DRSG TEGADERM 2-3/8X2-3/4 SM (GAUZE/BANDAGES/DRESSINGS) IMPLANT
DRSG TELFA 3X8 NADH (GAUZE/BANDAGES/DRESSINGS) IMPLANT
ELECT REM PT RETURN 9FT ADLT (ELECTROSURGICAL)
ELECTRODE REM PT RTRN 9FT ADLT (ELECTROSURGICAL) IMPLANT
GLOVE BIO SURGEON STRL SZ7.5 (GLOVE) IMPLANT
GLOVE INDICATOR 8.0 STRL GRN (GLOVE) IMPLANT
GOWN STRL REUS W/ TWL LRG LVL3 (GOWN DISPOSABLE) IMPLANT
GOWN STRL REUS W/TWL LRG LVL3 (GOWN DISPOSABLE)
GRASPER SUT TROCAR 14GX15 (MISCELLANEOUS) IMPLANT
IRRIGATION STRYKERFLOW (MISCELLANEOUS) IMPLANT
IRRIGATOR STRYKERFLOW (MISCELLANEOUS)
IV NS 1000ML (IV SOLUTION)
IV NS 1000ML BAXH (IV SOLUTION) IMPLANT
LABEL OR SOLS (LABEL) IMPLANT
NDL SAFETY 18GX1.5 (NEEDLE) IMPLANT
NEEDLE HYPO 25X1 1.5 SAFETY (NEEDLE) IMPLANT
NEEDLE INSUFFLATION 14GA 120MM (NEEDLE) IMPLANT
NS IRRIG 500ML POUR BTL (IV SOLUTION) IMPLANT
PACK LAP CHOLECYSTECTOMY (MISCELLANEOUS) IMPLANT
POUCH ENDO CATCH 10MM SPEC (MISCELLANEOUS) IMPLANT
SCISSORS METZENBAUM CVD 33 (INSTRUMENTS) IMPLANT
SEAL FOR SCOPE WARMER C3101 (MISCELLANEOUS) IMPLANT
SLEEVE ADV FIXATION 5X100MM (TROCAR) IMPLANT
SUT ETHILON 5-0 FS-2 18 BLK (SUTURE) IMPLANT
SUT VIC AB 0 CT2 27 (SUTURE) IMPLANT
SYR 3ML LL SCALE MARK (SYRINGE) IMPLANT
TROCAR Z-THREAD FIOS 11X100 BL (TROCAR) IMPLANT
TROCAR Z-THREAD OPTICAL 5X100M (TROCAR) IMPLANT
TROCAR Z-THREAD SLEEVE 11X100 (TROCAR) IMPLANT
TUBING INSUFFLATOR HI FLOW (MISCELLANEOUS) IMPLANT
WATER STERILE IRR 1000ML POUR (IV SOLUTION) IMPLANT

## 2015-11-06 NOTE — Progress Notes (Signed)
MEDICATION RELATED CONSULT NOTE - INITIAL   Pharmacy Consult for amiodarone DDI Indication: atrial fibrillation  Allergies  Allergen Reactions  . Bupropion Hcl     REACTION: Severe constipation, insomnia  . Penicillins Other (See Comments)    REACTION: Vomiting Has patient had a PCN reaction causing immediate rash, facial/tongue/throat swelling, SOB or lightheadedness with hypotension: Yes Has patient had a PCN reaction causing severe rash involving mucus membranes or skin necrosis: No Has patient had a PCN reaction that required hospitalization No Has patient had a PCN reaction occurring within the last 10 years: No If all of the above answers are "NO", then may proceed with Cephalosporin use.    Patient Measurements: Height: _0  (170.2 cm) Weight: 207 lb 6.4 oz (94.1 kg) IBW/kg (Calculated) : 66.1   Vital Signs: Temp: 97.6 F (36.4 C) (08/16 1734) Temp Source: Oral (08/16 1734) BP: 157/69 (08/16 1906) Pulse Rate: 96 (08/16 1906) Intake/Output from previous day: 08/15 0701 - 08/16 0700 In: 3590.6 [P.O.:600; I.V.:2840.6; IV Piggyback:150] Out: 2200 [Urine:2200] Intake/Output from this shift: Total I/O In: -  Out: 525 [Urine:525]  Labs:  Recent Labs  11/04/15 0841 11/04/15 0842 11/05/15 0450 11/06/15 0344  WBC 24.2*  --  13.6* 12.7*  HGB 14.6  --  12.1* 12.5*  HCT 40.9  --  34.1* 36.0*  PLT 176  --  128* 144*  APTT 27  --   --   --   CREATININE 2.40*  --  2.10* 1.75*  MG  --  1.9  --   --   ALBUMIN 3.5  --  2.7*  --   PROT 6.9  --  5.6*  --   AST 28  --  17  --   ALT 22  --  20  --   ALKPHOS 55  --  66  --   BILITOT 1.8*  --  1.1  --    Estimated Creatinine Clearance: 47.2 mL/min (by C-G formula based on SCr of 1.75 mg/dL).   Microbiology: No results found for this or any previous visit (from the past 720 hour(s)).  Medical History: Past Medical History:  Diagnosis Date  . GERD (gastroesophageal reflux disease)   . High cholesterol   .  Hypertension 10/17/2013  . Hypothyroidism   . Thyroid disease   . Type II diabetes mellitus (HCC)     Medications:  Scheduled:  . atorvastatin  40 mg Oral q1800  . diltiazem  30 mg Oral Z3G  . folic acid  1 mg Oral Daily  . insulin aspart  0-5 Units Subcutaneous QHS  . insulin aspart  0-9 Units Subcutaneous TID WC  . levothyroxine  100 mcg Oral QAC breakfast  . LORazepam  0-4 mg Oral Q12H  . multivitamin with minerals  1 tablet Oral Daily  . pantoprazole  40 mg Oral Daily  . piperacillin-tazobactam (ZOSYN)  IV  3.375 g Intravenous Q8H  . potassium chloride  20 mEq Oral TID  . sodium chloride flush  3 mL Intravenous Q12H  . thiamine  100 mg Oral Daily   Or  . thiamine  100 mg Intravenous Daily    Assessment: 65 yo male on amiodarone infusion for atrial fibrillation. Patient has two major and one minor interaction with amiodarone. Amiodarone interacts with Zofran PRN and diltiazem as major interactions. Amiodarone interacts with atorvastatin as a minor interaction.   Plan:  No dose adjustments or medication changes needed at this time.   Monitor as follows: 1. Amiodarone  and Zofran --> QTc 2. Amiodarone and diltiazem --> HR and telemetry 3. Amiodarone and atorvastatin --> myopathy  Loree Fee 11/06/2015,7:32 PM

## 2015-11-06 NOTE — Progress Notes (Addendum)
Cardiac catheterization result Four-vessel disease, Critical mid to distal RCA, critical ostial ramus and left circumflex disease, Moderate to severe proximal LAD disease  Very challenging case with difficult to engage RCA Numerous catheters used, finally we had good visualization showing severe disease  Results discussed with family, with Dr. Ellyn Hack, Recommendation made for bypass surgery Procedure discussed with patient and family in detail, including process to transfer the patient to outside hospital. Various options discussed with him and after long discussion,  he is agreeable to go to [facility]  He presented to the holding area prior to the case in normal sinus rhythm Converted to atrial fibrillation prior to the start of catheterization Continued in atrial fibrillation through the case  Order placed to start amiodarone bolus with infusion in an effort to restore normal sinus rhythm  Message sent to Dr. Pat Patrick who is scrubbed in surgery Patient would like to eat though given recent severe gallbladder pain, will minimize his oral intake for now.  Will restart heparin possibly late this evening with no bolus. All questions from family answered   Total encounter time more than 60 minutes  Greater than 50% was spent in counseling and coordination of care with the patient   Signed, Esmond Plants, MD, Ph.D [facility]

## 2015-11-06 NOTE — Progress Notes (Signed)
[facility] [place] NAME: Brian Casey    MR#:  [id number]  DATE OF BIRTH:  1950/12/14  SUBJECTIVE:   Patient reports abdominal pain with eating.  REVIEW OF SYSTEMS:    Review of Systems  Constitutional: Negative.  Negative for chills, fever and malaise/fatigue.  HENT: Negative.  Negative for ear discharge, ear pain, hearing loss, nosebleeds and sore throat.   Eyes: Negative.  Negative for blurred vision and pain.  Respiratory: Negative.  Negative for cough, hemoptysis, shortness of breath and wheezing.   Cardiovascular: Negative.  Negative for chest pain, palpitations and leg swelling.  Gastrointestinal: Positive for abdominal pain. Negative for blood in stool, diarrhea, nausea and vomiting.  Genitourinary: Negative.  Negative for dysuria.  Musculoskeletal: Negative.  Negative for back pain.  Skin: Negative.   Neurological: Negative for dizziness, tremors, speech change, focal weakness, seizures and headaches.  Endo/Heme/Allergies: Negative.  Does not bruise/bleed easily.  Psychiatric/Behavioral: Negative.  Negative for depression, hallucinations and suicidal ideas.    Tolerating Diet:Nothing by mouth      DRUG ALLERGIES:   Allergies  Allergen Reactions  . Bupropion Hcl     REACTION: Severe constipation, insomnia  . Penicillins Other (See Comments)    REACTION: Vomiting Has patient had a PCN reaction causing immediate rash, facial/tongue/throat swelling, SOB or lightheadedness with hypotension: Yes Has patient had a PCN reaction causing severe rash involving mucus membranes or skin necrosis: No Has patient had a PCN reaction that required hospitalization No Has patient had a PCN reaction occurring within the last 10 years: No If all of the above answers are "NO", then may proceed with Cephalosporin use.    VITALS:  Blood pressure (!) 139/56, pulse 71, temperature 98.1 F (36.7 C), temperature source Oral, resp. rate 18,  height _0  (1.702 m), weight 94.1 kg (207 lb 6.4 oz), SpO2 95 %.  PHYSICAL EXAMINATION:   Physical Exam  Constitutional: He is oriented to person, place, and time and well-developed, well-nourished, and in no distress. No distress.  HENT:  Head: Normocephalic.  Eyes: No scleral icterus.  Neck: Normal range of motion. Neck supple. No JVD present. No tracheal deviation present.  Cardiovascular: Normal rate, regular rhythm and normal heart sounds.  Exam reveals no gallop and no friction rub.   No murmur heard. Pulmonary/Chest: Effort normal and breath sounds normal. No respiratory distress. He has no wheezes. He has no rales. He exhibits no tenderness.  Abdominal: Soft. Bowel sounds are normal. He exhibits no distension and no mass. There is tenderness. There is no rebound and no guarding.  Musculoskeletal: Normal range of motion. He exhibits no edema.  Neurological: He is alert and oriented to person, place, and time.  Skin: Skin is warm. No rash noted. No erythema.  Psychiatric: Affect and judgment normal.      LABORATORY PANEL:   CBC  Recent Labs Lab 11/06/15 0344  WBC 12.7*  HGB 12.5*  HCT 36.0*  PLT 144*   ------------------------------------------------------------------------------------------------------------------  Chemistries   Recent Labs Lab 11/04/15 0842 11/05/15 0450 11/06/15 0344  NA  --  125* 127*  K  --  3.7 4.0  CL  --  92* 97*  CO2  --  23 24  GLUCOSE  --  124* 177*  BUN  --  35* 24*  CREATININE  --  2.10* 1.75*  CALCIUM  --  7.6* 8.0*  MG 1.9  --   --   AST  --  17  --  ALT  --  20  --   ALKPHOS  --  66  --   BILITOT  --  1.1  --    ------------------------------------------------------------------------------------------------------------------  Cardiac Enzymes  Recent Labs Lab 11/04/15 1257 11/04/15 2355 11/05/15 0607  TROPONINI 0.04* 0.11* 0.04*    ------------------------------------------------------------------------------------------------------------------  RADIOLOGY:  US Renal  Result Date: 11/04/2015 CLINICAL DATA:  Acute renal failure. EXAM: RENAL / URINARY TRACT ULTRASOUND COMPLETE COMPARISON:  None. FINDINGS: Right Kidney: Length: 12.4 cm. Echogenicity within normal limits. No mass or hydronephrosis visualized. Left Kidney: Length: 11.1 cm. Echogenicity within normal limits. No mass or hydronephrosis visualized. Bladder: Appears normal for degree of bladder distention. IMPRESSION: Negative. Electronically Signed   By: Staci Righter M.D.   On: 11/04/2015 17:31   Nm Myocar Multi W/spect W/wall Motion / Ef  Result Date: 11/06/2015 Pharmacological myocardial perfusion imaging study with moderate sized region of moderate intensity ischemia in the lateral wall Normal wall motion, EF estimated at 68% No EKG changes concerning for ischemia at peak stress or in recovery. High risk scan given lateral wall ischemia, given recent troponin elevation Signed, Esmond Plants, MD, Ph.D [facility]     ASSESSMENT AND PLAN:   65 year old male with history of diabetes, hypogonadism and chronic EtOH abuse who presented with right upper quadrant abdominal pain and found to have acalculous acute cholecystitis as well as new onset atrial fibrillation.   1. Sepsis due to acute acalculous cholecystitis: . Patient presented with tachycardia and leukocytosis. Continue Zosyn.   2. New atrial fibrillation: Patient has now converted to sinus tachycardia. Echocardiogram showed ejection fraction 55% with stage II diastolic heart failure. Continue diltiazem. Continue heparin drip for anticoagulation given chads 2 Vascor of at least 2.  3. Elevated troponin without chest pain: This is due to supply demand ischemia with atrial fibrillation and RVR along with sepsis due to a technical his cholecystitis. Patient underwent stress test today.  4. Acute  calculus cholecystitis: Plan is for cholecystectomy if stress test is negative.  5. Acute kidney injury on chronic kidney disease stage II: This is due to sepsis from cholecystitis and poor by mouth intake as well as taking losartan/HCTZ. Creatinine is improving with IV fluids. Appreciate nephrology consult.  6. Hyponatremia in the setting of HCTZ use as well as poor by mouth intake and EtOH abuse Sodium improving with IV fluids.  7. EtOH abuse: Patient is on CIWA protocol without eventful detox. 8. Essential hypertension: Continue diltiazem.  9. Hypothyroid: Continue Synthroid. TSH was within normal limits.  Management plans discussed with the patient and he is in agreement.  CODE STATUS: full  TOTAL TIME TAKING CARE OF THIS PATIENT: 30 minutes.     POSSIBLE D/C 2-3 days, DEPENDING ON CLINICAL CONDITION.   Ariannie Penaloza M.D on 11/06/2015 at 12:30 PM  Between 7am to 6pm - Pager - [phone] After 6pm go to [email or web address] - password [email or web address] [facility]  Office  [phone]  CC: Primary care physician; [name], MD  Note: This dictation was prepared with Dragon dictation along with smaller phrase technology. Any transcriptional errors that result from this process are unintentional.

## 2015-11-06 NOTE — Progress Notes (Signed)
Patient returned from cath lab, R groin site soft & intact, WNL. No evidence of hematoma, bleeding, or ecchymosis. Pedal pulses +2 bilaterally. VSS, orders for amio gtt as pt is back in afib/flutter. HR stable per CCMB at 82. Will continue to monitor.

## 2015-11-06 NOTE — Discharge Summary (Addendum)
[facility] NAME: Brian Casey    MR#:  [id number]  DATE OF BIRTH:  Jan 16, 1951  DATE OF ADMISSION:  11/04/2015 ADMITTING PHYSICIAN: Bettey Costa, MD  DATE OF DISCHARGE: 11/07/2015  PRIMARY CARE PHYSICIAN: [name], MD    ADMISSION DIAGNOSIS:  Cholecystitis [K81.9] Right upper quadrant pain [R10.11] Acute renal failure (ARF) ([place]) [N17.9] Elevated troponin I level [R79.89] Acute renal failure, unspecified acute renal failure type ([place]) [N17.9] Atrial fibrillation, unspecified type ([place]) [I48.91]  DISCHARGE DIAGNOSIS:  Principal Problem:   Cholecystitis NSTEMI Active Problems:   Hypothyroidism   Diabetes ([place])   Anxiety state   NICOTINE ADDICTION   HLD (hyperlipidemia)   Essential hypertension   PAF (paroxysmal atrial fibrillation) (HCC)   Acute renal failure (ARF) (HCC)   Right upper quadrant pain   Aortic atherosclerosis (HCC)   Pre-op evaluation   Positive cardiac stress test   NSTEMI (non-ST elevated myocardial infarction) ([place])   Coronary artery disease involving native coronary artery of native heart with angina pectoris (HCC)   Moderate major depression, single episode (HCC)   Generalized anxiety disorder   SECONDARY DIAGNOSIS:   Past Medical History:  Diagnosis Date  . GERD (gastroesophageal reflux disease)   . High cholesterol   . Hypertension 10/17/2013  . Hypothyroidism   . Thyroid disease   . Type II diabetes mellitus [facility])     HOSPITAL COURSE:  65 year old male with history of diabetes, hypogonadism and chronic EtOH abuse who presented with right upper quadrant abdominal pain and found to have acalculous acute cholecystitis as well as new onset atrial fibrillation.   1. Sepsis due to acute acalculous cholecystitis: Patient presented with tachycardia and leukocytosis.He is on Zosyn.   2. New atrial fibrillation: Patient had converted to sinus tachycardia. Echocardiogram showed ejection  fraction 55% with stage II diastolic heart failure. He was initiated on dilt gtt but this was converted to PO diltiazem. While patient was undergoing cardia cath he had episodes of Atrial Fibrillation. Dr Rockey Situ recommended starting AMIODARONE.  He is also on a heparin drip for anticoagulation given chads 2 Vasc score of > 2.  3.NSTEMI:INitally elevated troponins thought to be demand ischemia. He underwent stress test which was abnormal he then underwent cardiac cath. He has severe 4 vessel disease and will be transferred to [place] for evaluation of intervention i.e. CABG. He needs statin, BB, ASA, and cardiology follow up with most likely CABG   4. Acute calculus cholecystitis: Plan was for cholecystectomy if stress test was negative. This needs to be reevaluated after cardiac issues have been addressed. Her is ZOSYN  5. Acute kidney injury on chronic kidney disease stage II: This is due to sepsis from cholecystitis and poor by mouth intake as well as taking losartan/HCTZ. Creatinine has improved with IV fluids. Appreciate nephrology consult.  6. Hyponatremia in the setting of HCTZ use as well as poor by mouth intake and EtOH abuse Sodium improving with IV fluids.  7. EtOH abuse: Patient was on CIWA protocol without eventful detox. 8. Essential hypertension: Continue diltiazem.  9. Hypothyroid: Continue Synthroid. TSH was within normal limits.  10. Depression and anxiety without suicidal thoughts: he was evaluated by PSYCHIATRY. Recommendations are to start Lexapro daily  and RESTRIL HS PRN.   DISCHARGE CONDITIONS AND DIET:   Stable [place] cardiac  CONSULTS OBTAINED:  Treatment Team:  Minna Merritts, MD Munsoor Holley Raring, MD Jeanie Cooks, MD Gonzella Lex, MD  DRUG  ALLERGIES:   Allergies  Allergen Reactions  . Bupropion Hcl     REACTION: Severe constipation, insomnia  . Penicillins Other (See Comments)    REACTION: Vomiting Has patient had a PCN  reaction causing immediate rash, facial/tongue/throat swelling, SOB or lightheadedness with hypotension: Yes Has patient had a PCN reaction causing severe rash involving mucus membranes or skin necrosis: No Has patient had a PCN reaction that required hospitalization No Has patient had a PCN reaction occurring within the last 10 years: No If all of the above answers are "NO", then may proceed with Cephalosporin use.    DISCHARGE MEDICATIONS:   Current Discharge Medication List    START taking these medications   Details  amiodarone (PACERONE) 400 MG tablet Take 1 tablet (400 mg total) by mouth 2 (two) times daily. Qty: 30 tablet, Refills: 0    aspirin 81 MG chewable tablet Chew 1 tablet (81 mg total) by mouth before cath procedure. Qty: 30 tablet, Refills: 0    diltiazem (CARDIZEM) 30 MG tablet Take 1 tablet (30 mg total) by mouth 3 (three) times daily. Qty: 120 tablet, Refills: 0    escitalopram (LEXAPRO) 10 MG tablet Take 1 tablet (10 mg total) by mouth daily. Qty: 30 tablet, Refills: 0    metoprolol tartrate (LOPRESSOR) 25 MG tablet Take 1 tablet (25 mg total) by mouth 2 (two) times daily. Qty: 30 tablet, Refills: 0    piperacillin-tazobactam (ZOSYN) 3.375 GM/50ML IVPB Inject 50 mLs (3.375 g total) into the vein every 8 (eight) hours. Qty: 50 mL, Refills: 0    temazepam (RESTORIL) 15 MG capsule Take 1 capsule (15 mg total) by mouth at bedtime as needed for sleep. Qty: 30 capsule, Refills: 0      CONTINUE these medications which have NOT CHANGED   Details  levothyroxine (SYNTHROID, LEVOTHROID) 100 MCG tablet Take 1 tablet (100 mcg total) by mouth daily before breakfast. OFFICE VISIT WITH  LABS REQUIRED FOR ADDITIONAL REFILLS Qty: 30 tablet, Refills: 0    omeprazole (PRILOSEC) 20 MG capsule Take 1 capsule (20 mg total) by mouth daily. OFFICE VISIT REQUIRED FOR ADDITIONAL REFILLS Qty: 30 capsule, Refills: 0    simvastatin (ZOCOR) 40 MG tablet Take 1 tablet (40 mg total) by  mouth at bedtime. Qty: 90 tablet, Refills: 0   Associated Diagnoses: HLD (hyperlipidemia)    ALPRAZolam (XANAX) 0.25 MG tablet Take 1 tablet (0.25 mg total) by mouth 2 (two) times daily as needed. Qty: 30 tablet, Refills: 0      STOP taking these medications     losartan-hydrochlorothiazide (HYZAAR) 100-25 MG tablet      metFORMIN (GLUCOPHAGE) 500 MG tablet      Blood Glucose Monitoring Suppl (ONE TOUCH BASIC SYSTEM) W/DEVICE KIT      glucose blood test strip      hydrochlorothiazide (MICROZIDE) 12.5 MG capsule      HYDROcodone-homatropine (HYCODAN) 5-1.5 MG/5ML syrup               Today   CHIEF COMPLAINT:    Patient anxious about transfer No chest pain abdominal pain about the same   VITAL SIGNS:  Blood pressure (!) 142/57, pulse 70, temperature 97.8 F (36.6 C), temperature source Oral, resp. rate 18, height _0  (1.702 m), weight 94.1 kg (207 lb 6.4 oz), SpO2 97 %.   REVIEW OF SYSTEMS:  Review of Systems  Constitutional: Negative.  Negative for chills, fever and malaise/fatigue.  HENT: Negative.  Negative for ear discharge, ear pain, hearing  loss, nosebleeds and sore throat.   Eyes: Negative.  Negative for blurred vision and pain.  Respiratory: Negative.  Negative for cough, hemoptysis, shortness of breath and wheezing.   Cardiovascular: Negative.  Negative for chest pain, palpitations and leg swelling.  Gastrointestinal: Positive for abdominal pain. Negative for blood in stool, diarrhea, nausea and vomiting.  Genitourinary: Negative.  Negative for dysuria.  Musculoskeletal: Negative.  Negative for back pain.  Skin: Negative.   Neurological: Negative for dizziness, tremors, speech change, focal weakness, seizures and headaches.  Endo/Heme/Allergies: Negative.  Does not bruise/bleed easily.  Psychiatric/Behavioral: Negative.  Negative for depression, hallucinations and suicidal ideas.     PHYSICAL EXAMINATION:  GENERAL:  64 y.o.-year-old patient  lying in the bed with no acute distress.  NECK:  Supple, no jugular venous distention. No thyroid enlargement, no tenderness.  LUNGS: Normal breath sounds bilaterally, no wheezing, rales,rhonchi  No use of accessory muscles of respiration.  CARDIOVASCULAR: S1, S2 normal. No murmurs, rubs, or gallops.  ABDOMEN: Soft,RUQ tenderness, non-distended. Bowel sounds present. No organomegaly or mass.  EXTREMITIES: No pedal edema, cyanosis, or clubbing.  PSYCHIATRIC: The patient is alert and oriented x 3.  anxious SKIN: No obvious rash, lesion, or ulcer.   DATA REVIEW:   CBC  Recent Labs Lab 11/07/15 0648  WBC 12.3*  HGB 11.5*  HCT 31.8*  PLT 144*    Chemistries   Recent Labs Lab 11/04/15 0842 11/05/15 0450  11/07/15 0648  NA  --  125*  < > 131*  K  --  3.7  < > 3.7  CL  --  92*  < > 102  CO2  --  23  < > 22  GLUCOSE  --  124*  < > 150*  BUN  --  35*  < > 16  CREATININE  --  2.10*  < > 1.24  CALCIUM  --  7.6*  < > 7.4*  MG 1.9  --   --   --   AST  --  17  --   --   ALT  --  20  --   --   ALKPHOS  --  66  --   --   BILITOT  --  1.1  --   --   < > = values in this interval not displayed.  Cardiac Enzymes  Recent Labs Lab 11/04/15 1257 11/04/15 2355 11/05/15 0607  TROPONINI 0.04* 0.11* 0.04*    Microbiology Results  _0 @  RADIOLOGY:  Nm Myocar Multi W/spect W/wall Motion / Ef  Result Date: 11/06/2015 Pharmacological myocardial perfusion imaging study with moderate sized region of moderate intensity ischemia in the lateral wall Normal wall motion, EF estimated at 68% No EKG changes concerning for ischemia at peak stress or in recovery. High risk scan given lateral wall ischemia, given recent troponin elevation Signed, Esmond Plants, MD, Ph.D [facility]      Management plans discussed with the patient and he is in agreement. Stable for discharge for CABG  Patient should follow up with cardiology  CODE STATUS:     Code Status Orders        Start      Ordered   11/04/15 1207  Full code  Continuous     11/04/15 1206    Code Status History    Date Active Date Inactive Code Status Order ID Comments User Context   11/04/2015 12:06 PM 11/04/2015  5:01 PM Full Code [id number]  Bettey Costa, MD ED  TOTAL TIME TAKING CARE OF THIS PATIENT: 36 minutes.    Note: This dictation was prepared with Dragon dictation along with smaller phrase technology. Any transcriptional errors that result from this process are unintentional.  Damiano Stamper M.D on 11/07/2015 at 8:51 AM  Between 7am to 6pm - Pager - [phone] After 6pm go to [email or web address] - password [email or web address] [facility]  Office  [phone]  CC: Primary care physician; [name], MD

## 2015-11-06 NOTE — Progress Notes (Signed)
Patient to have CABG. MD has spoken to patient and family and they are in agreement. Patient to be transferred to [facility] tomorrow in the AM. Report called to [facility] on 2A. Will transport patient at 1730.

## 2015-11-06 NOTE — Care Management (Signed)
Stress test today if negative with proceed with cholecystectomy today.

## 2015-11-06 NOTE — Progress Notes (Signed)
Patient and family requesting update from Dr. Pat Patrick given new information and plan from Dr. Rockey Situ. Dr. Pat Patrick paged, in surgery right now, will update family when next available.

## 2015-11-06 NOTE — Progress Notes (Signed)
He remains in sinus rhythm to sinus tachycardia in the low 100's bpm. He is for nuclear stress test today.

## 2015-11-06 NOTE — Progress Notes (Signed)
Patient reporting extreme anxiety. Pt wife states he usually takes PO alprazolam at home. Wife also asking if MDs can look into starting patient on antidepressants. Dr. Benjie Karvonen paged and orders placed.

## 2015-11-06 NOTE — Consult Note (Signed)
[facility] Face-to-Face Psychiatry Consult   Reason for Consult:  Consult for this 65 year old man currently in the hospital for gallbladder surgery which has now been delayed because of cardiac concerns. Consult for depression and anxietyMody Referring Physician:  Mody Patient Identification: Brian Casey MRN:  [id number] Principal Diagnosis: Moderate major depression, single episode [facility]) Diagnosis:   Patient Active Problem List   Diagnosis Date Noted  . Moderate major depression, single episode ([place]) [F32.1] 11/06/2015  . Generalized anxiety disorder [F41.1] 11/06/2015  . Pre-op evaluation [Z01.818]   . Positive cardiac stress test [R94.39]   . NSTEMI (non-ST elevated myocardial infarction) ([place]) [I21.4]   . Coronary artery disease involving native coronary artery of native heart with angina pectoris ([place]) [I25.119]   . Cholecystitis [K81.9] 11/04/2015  . Atrial fibrillation ([place]) [I48.91] 11/04/2015  . Acute renal failure (ARF) ([place]) [N17.9]   . Elevated troponin I level [R79.89]   . Right upper quadrant pain [R10.11]   . Coronary artery disease involving native coronary artery of native heart with angina pectoris with documented spasm ([place]) [I25.111]   . Aortic atherosclerosis ([place]) [I70.0]   . Visit for well man health check [Z00.00] 11/06/2014  . Counseling on health promotion and disease prevention [Z71.89] 03/29/2014  . Umbilical hernia [N82.9] 10/31/2013  . 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 Essential hypertension [I10] 10/17/2013  . Weight gain [R63.5] 10/11/2013  . Swelling of right knee joint [M25.461] 10/11/2013  . Puncture wound [T14.8] 08/18/2013  . Left low back pain [M54.5] 04/13/2013  . HLD (hyperlipidemia) [E78.5] 01/26/2011  . Anxiety state [F41.1] 12/26/2008  . Hypothyroidism [E03.9] 03/02/2007  . Diabetes ([place]) [E11.9] 03/02/2007  . NICOTINE ADDICTION [F17.200] 03/02/2007  . DEPRESSION/ ANGER CONTROL [F32.9] 03/02/2007    Total Time spent with patient: 1  hour  Subjective:   Brian Casey is a 65 y.o. male patient admitted with "the one thing they admitted me for is the thing they are not taking care of" HPI:  Patient interviewed. Chart reviewed . spoke with his wife as well. 44 year old man was admitted for treatment of his gallbladder .patient was then found to have some cardiac problems and apparently now he is to be transferred to another hospital for cardiac surgery. He is still upset that his gallbladder is still hurting. Psychiatrically the patient complains of long-standing anxiety and depression. At first he relates his frustration to his hospital course and the finances accompanying it, but it becomes clear then that his primary complaint is about his job. He finds his job extraordinarily stressful and unpleasant and has been feeling that way for years area he had planned to retire at the end of the year. This complication of his medical problem is potentially upsetting that. Patient has a low mood much of the time with a lot of irritability. Sleep is poor. Wakes up frequently. Overall he is looking forward to recovery but has a certain degree of negative hopelessness. He denies any suicidal thoughts. No psychotic symptoms. As far as drinking, he reports that for the last several years he has consumed about 5 drinks of alcohol most nights. The patient relates this directly to the stress of his job. He gives examples that when he is on vacation he hardly drinks at all. Has no history of alcohol withdrawal or any clear negative outcomes from drinking. No history of any substance abuse treatment.  Social history: Married, 2 adult children, good relationship with all of them. 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 Job is described as being extremely stressful and unpleasant. Wife confirms that.  Medical history: Overweight. Hypothyroid. Diabetes. High blood pressure. Came into the hospital for gallbladder surgery and now is being transferred  for a possible CABG.  Substance abuse history: See note above. He drinks but it's not clear that it has ever been abusively. Does not use any other drugs.  Past Psychiatric History: Saw a therapist briefly years ago for stress related to his children. He is also briefly on Zoloft. Stopped taking it because of sexual side effects. No history of psychiatric admission or suicide attempt.  Risk to Self: Is patient at risk for suicide?: No Risk to Others:   Prior Inpatient Therapy:   Prior Outpatient Therapy:    Past Medical History:  Past Medical History:  Diagnosis Date  . GERD (gastroesophageal reflux disease)   . High cholesterol   . Hypertension 10/17/2013  . Hypothyroidism   . Thyroid disease   . Type II diabetes mellitus ([place])     Past Surgical History:  Procedure Laterality Date  . COLONOSCOPY    . CYST REMOVAL HAND Left finger  . hemmorhoidectomy  1982  . INSERTION OF MESH N/A 11/16/2013   Procedure: INSERTION OF MESH;  Surgeon: Imogene Burn. Georgette Dover, MD;  Location: [name];  Service: General;  Laterality: N/A;  . UMBILICAL HERNIA REPAIR N/A 11/16/2013   Procedure: UMBILICAL HERNIA REPAIR;  Surgeon: Imogene Burn. Georgette Dover, MD;  Location: [facility];  Service: General;  Laterality: N/A;   Family History:  Family History  Problem Relation Age of Onset  . Cancer Mother     colon  . Hypertension Mother   . Diabetes Mother   . Depression Mother   . Colon cancer Mother   . Hyperthyroidism Father   . Hypertension Father   . Cancer Father     prostate  . Diabetes Maternal Grandmother   . Cancer Maternal Grandmother     lung, non smoker   Family Psychiatric  History: Not aware of any family history of mental illness Social History:  History  Alcohol Use  . 8.4 oz/week  . 7 Cans of beer, 7 Shots of liquor per week     History  Drug Use No    Social History   Social History  . Marital status: Married    Spouse name: N/A  . Number of children: 2  . Years of education: N/A    Occupational History  . Product Engineer-terminated, Engineer, manufacturing systems    Social History Casey Topics  . Smoking status: Current Some Day Smoker    Packs/day: 0.13    Years: 30.00    Types: Cigarettes  . Smokeless tobacco: Never Used  . Alcohol use 8.4 oz/week    7 Cans of beer, 7 Shots of liquor per week  . Drug use: No  . Sexual activity: Not Asked   Other Topics Concern  . None   Social History Narrative  . None   Additional Social History:    Allergies:   Allergies  Allergen Reactions  . Bupropion Hcl     REACTION: Severe constipation, insomnia  . Penicillins Other (See Comments)    REACTION: Vomiting Has patient had a PCN reaction causing immediate rash, facial/tongue/throat swelling, SOB or lightheadedness with hypotension: Yes Has patient had a PCN reaction causing severe rash involving mucus membranes or skin necrosis: No Has patient had a PCN reaction that required hospitalization No Has patient had a PCN reaction  occurring within the last 10 years: No If all of the above answers are "NO", then may proceed with Cephalosporin use.    Labs:  Results for orders placed or performed during the hospital encounter of 11/04/15 (from the past 48 hour(s))  Glucose, capillary     Status: Abnormal   Collection Time: 11/04/15  9:04 PM  Result Value Ref Range   Glucose-Capillary 131 (H) 65 - 99 mg/dL  Urinalysis complete, with microscopic     Status: Abnormal   Collection Time: 11/04/15  9:23 PM  Result Value Ref Range   Color, Urine YELLOW (A) YELLOW   APPearance CLEAR (A) CLEAR   Glucose, UA >500 (A) NEGATIVE mg/dL   Bilirubin Urine NEGATIVE NEGATIVE   Ketones, ur NEGATIVE NEGATIVE mg/dL   Specific Gravity, Urine 1.013 1.005 - 1.030   Hgb urine dipstick NEGATIVE NEGATIVE   pH 6.0 5.0 - 8.0   Protein, ur NEGATIVE NEGATIVE mg/dL   Nitrite NEGATIVE NEGATIVE   Leukocytes, UA NEGATIVE NEGATIVE   RBC / HPF 0-5 0 - 5 RBC/hpf   WBC, UA 0-5 0 - 5  WBC/hpf   Bacteria, UA NONE SEEN NONE SEEN   Squamous Epithelial / LPF 0-5 (A) NONE SEEN  Sodium, urine, random     Status: None   Collection Time: 11/04/15  9:23 PM  Result Value Ref Range   Sodium, Ur 36 mmol/L  Troponin I     Status: Abnormal   Collection Time: 11/04/15 11:55 PM  Result Value Ref Range   Troponin I 0.11 (HH) <0.03 ng/mL    Comment: CRITICAL VALUE NOTED. VALUE IS CONSISTENT WITH PREVIOUSLY REPORTED/CALLED VALUE  CBC     Status: Abnormal   Collection Time: 11/05/15  4:50 AM  Result Value Ref Range   WBC 13.6 (H) 3.8 - 10.6 K/uL   RBC 3.56 (L) 4.40 - 5.90 MIL/uL   Hemoglobin 12.1 (L) 13.0 - 18.0 g/dL   HCT 34.1 (L) 40.0 - 52.0 %   MCV 95.8 80.0 - 100.0 fL   MCH 34.0 26.0 - 34.0 pg   MCHC 35.5 32.0 - 36.0 g/dL   RDW 13.0 11.5 - 14.5 %   Platelets 128 (L) 150 - 440 K/uL  Comprehensive metabolic panel     Status: Abnormal   Collection Time: 11/05/15  4:50 AM  Result Value Ref Range   Sodium 125 (L) 135 - 145 mmol/L   Potassium 3.7 3.5 - 5.1 mmol/L   Chloride 92 (L) 101 - 111 mmol/L   CO2 23 22 - 32 mmol/L   Glucose, Bld 124 (H) 65 - 99 mg/dL   BUN 35 (H) 6 - 20 mg/dL   Creatinine, Ser 2.10 (H) 0.61 - 1.24 mg/dL   Calcium 7.6 (L) 8.9 - 10.3 mg/dL   Total Protein 5.6 (L) 6.5 - 8.1 g/dL   Albumin 2.7 (L) 3.5 - 5.0 g/dL   AST 17 15 - 41 U/L   ALT 20 17 - 63 U/L   Alkaline Phosphatase 66 38 - 126 U/L   Total Bilirubin 1.1 0.3 - 1.2 mg/dL   GFR calc non Af Amer 32 (L) >60 mL/min   GFR calc Af Amer 37 (L) >60 mL/min    Comment: (NOTE) The eGFR has been calculated using the CKD EPI equation. This calculation has not been validated in all clinical situations. eGFR's persistently <60 mL/min signify possible Chronic Kidney Disease.    Anion gap 10 5 - 15  Heparin level (unfractionated)  Status: Abnormal   Collection Time: 11/05/15  4:50 AM  Result Value Ref Range   Heparin Unfractionated 0.29 (L) 0.30 - 0.70 IU/mL    Comment:        IF HEPARIN RESULTS ARE  BELOW EXPECTED VALUES, AND PATIENT DOSAGE HAS BEEN CONFIRMED, SUGGEST FOLLOW UP TESTING OF ANTITHROMBIN III LEVELS.   Troponin I     Status: Abnormal   Collection Time: 11/05/15  6:07 AM  Result Value Ref Range   Troponin I 0.04 (HH) <0.03 ng/mL    Comment: CRITICAL VALUE NOTED. VALUE IS CONSISTENT WITH PREVIOUSLY REPORTED/CALLED VALUE DAS   Glucose, capillary     Status: Abnormal   Collection Time: 11/05/15  7:24 AM  Result Value Ref Range   Glucose-Capillary 147 (H) 65 - 99 mg/dL  Glucose, capillary     Status: Abnormal   Collection Time: 11/05/15 11:38 AM  Result Value Ref Range   Glucose-Capillary 156 (H) 65 - 99 mg/dL  Heparin level (unfractionated)     Status: Abnormal   Collection Time: 11/05/15  2:02 PM  Result Value Ref Range   Heparin Unfractionated 0.19 (L) 0.30 - 0.70 IU/mL    Comment:        IF HEPARIN RESULTS ARE BELOW EXPECTED VALUES, AND PATIENT DOSAGE HAS BEEN CONFIRMED, SUGGEST FOLLOW UP TESTING OF ANTITHROMBIN III LEVELS.   Glucose, capillary     Status: Abnormal   Collection Time: 11/05/15  5:01 PM  Result Value Ref Range   Glucose-Capillary 162 (H) 65 - 99 mg/dL  Glucose, capillary     Status: Abnormal   Collection Time: 11/05/15  9:21 PM  Result Value Ref Range   Glucose-Capillary 171 (H) 65 - 99 mg/dL  Heparin level (unfractionated)     Status: None   Collection Time: 11/05/15  9:33 PM  Result Value Ref Range   Heparin Unfractionated 0.31 0.30 - 0.70 IU/mL    Comment:        IF HEPARIN RESULTS ARE BELOW EXPECTED VALUES, AND PATIENT DOSAGE HAS BEEN CONFIRMED, SUGGEST FOLLOW UP TESTING OF ANTITHROMBIN III LEVELS.   Basic metabolic panel     Status: Abnormal   Collection Time: 11/06/15  3:44 AM  Result Value Ref Range   Sodium 127 (L) 135 - 145 mmol/L   Potassium 4.0 3.5 - 5.1 mmol/L   Chloride 97 (L) 101 - 111 mmol/L   CO2 24 22 - 32 mmol/L   Glucose, Bld 177 (H) 65 - 99 mg/dL   BUN 24 (H) 6 - 20 mg/dL   Creatinine, Ser 1.75 (H) 0.61 -  1.24 mg/dL   Calcium 8.0 (L) 8.9 - 10.3 mg/dL   GFR calc non Af Amer 40 (L) >60 mL/min   GFR calc Af Amer 46 (L) >60 mL/min    Comment: (NOTE) The eGFR has been calculated using the CKD EPI equation. This calculation has not been validated in all clinical situations. eGFR's persistently <60 mL/min signify possible Chronic Kidney Disease.    Anion gap 6 5 - 15  CBC     Status: Abnormal   Collection Time: 11/06/15  3:44 AM  Result Value Ref Range   WBC 12.7 (H) 3.8 - 10.6 K/uL   RBC 3.71 (L) 4.40 - 5.90 MIL/uL   Hemoglobin 12.5 (L) 13.0 - 18.0 g/dL   HCT 36.0 (L) 40.0 - 52.0 %   MCV 97.0 80.0 - 100.0 fL   MCH 33.6 26.0 - 34.0 pg   MCHC 34.6 32.0 - 36.0 g/dL  RDW 13.2 11.5 - 14.5 %   Platelets 144 (L) 150 - 440 K/uL  Heparin level (unfractionated)     Status: None   Collection Time: 11/06/15  3:44 AM  Result Value Ref Range   Heparin Unfractionated 0.42 0.30 - 0.70 IU/mL    Comment:        IF HEPARIN RESULTS ARE BELOW EXPECTED VALUES, AND PATIENT DOSAGE HAS BEEN CONFIRMED, SUGGEST FOLLOW UP TESTING OF ANTITHROMBIN III LEVELS.   Glucose, capillary     Status: Abnormal   Collection Time: 11/06/15  7:22 AM  Result Value Ref Range   Glucose-Capillary 170 (H) 65 - 99 mg/dL  Glucose, capillary     Status: Abnormal   Collection Time: 11/06/15 11:13 AM  Result Value Ref Range   Glucose-Capillary 161 (H) 65 - 99 mg/dL  Glucose, capillary     Status: Abnormal   Collection Time: 11/06/15  5:39 PM  Result Value Ref Range   Glucose-Capillary 106 (H) 65 - 99 mg/dL    Current Facility-Administered Medications  Medication Dose Route Frequency Provider Last Rate Last Dose  . 0.9 %  sodium chloride infusion   Intravenous Continuous Minna Merritts, MD 150 mL/hr at 11/06/15 1804    . acetaminophen (TYLENOL) tablet 650 mg  650 mg Oral Q6H PRN Bettey Costa, MD   650 mg at 11/06/15 1405   Or  . acetaminophen (TYLENOL) suppository 650 mg  650 mg Rectal Q6H PRN Bettey Costa, MD      .  amiodarone (NEXTERONE PREMIX) 360-4.14 MG/200ML-% (1.8 mg/mL) IV infusion  60 mg/hr Intravenous Continuous Minna Merritts, MD 33.3 mL/hr at 11/06/15 1810 60 mg/hr at 11/06/15 1810   Followed by  . [START ON 11/07/2015] amiodarone (NEXTERONE PREMIX) 360-4.14 MG/200ML-% (1.8 mg/mL) IV infusion  30 mg/hr Intravenous Continuous Minna Merritts, MD      . atorvastatin (LIPITOR) tablet 40 mg  40 mg Oral q1800 Areta Haber Dunn, PA-C   40 mg at 11/06/15 1811  . diltiazem (CARDIZEM) tablet 30 mg  30 mg Oral Q6H Ryan M Dunn, PA-C   30 mg at 11/06/15 1811  . escitalopram (LEXAPRO) tablet 10 mg  10 mg Oral Daily Gonzella Lex, MD      . folic acid (FOLVITE) tablet 1 mg  1 mg Oral Daily Bettey Costa, MD   1 mg at 11/05/15 0909  . HYDROcodone-homatropine (HYCODAN) 5-1.5 MG/5ML syrup 5 mL  5 mL Oral Q8H PRN Sital Mody, MD      . insulin aspart (novoLOG) injection 0-5 Units  0-5 Units Subcutaneous QHS Sital Mody, MD      . insulin aspart (novoLOG) injection 0-9 Units  0-9 Units Subcutaneous TID WC Bettey Costa, MD   2 Units at 11/06/15 1140  . ketorolac (TORADOL) 15 MG/ML injection 15 mg  15 mg Intravenous Q6H PRN Bettey Costa, MD      . levothyroxine (SYNTHROID, LEVOTHROID) tablet 100 mcg  100 mcg Oral QAC breakfast Bettey Costa, MD   100 mcg at 11/05/15 0806  . LORazepam (ATIVAN) injection 0.5 mg  0.5 mg Intravenous Q6H PRN Bettey Costa, MD   0.5 mg at 11/06/15 1405  . LORazepam (ATIVAN) tablet 1 mg  1 mg Oral Q6H PRN Bettey Costa, MD       Or  . LORazepam (ATIVAN) injection 1 mg  1 mg Intravenous Q6H PRN Sital Mody, MD      . LORazepam (ATIVAN) tablet 0-4 mg  0-4 mg Oral Q12H Sital Mody,  MD      . multivitamin with minerals tablet 1 tablet  1 tablet Oral Daily Bettey Costa, MD   1 tablet at 11/05/15 0909  . ondansetron (ZOFRAN) tablet 4 mg  4 mg Oral Q6H PRN Bettey Costa, MD       Or  . ondansetron (ZOFRAN) injection 4 mg  4 mg Intravenous Q6H PRN Sital Mody, MD      . pantoprazole (PROTONIX) EC tablet 40 mg  40 mg Oral Daily  Bettey Costa, MD   40 mg at 11/05/15 0909  . piperacillin-tazobactam (ZOSYN) IVPB 3.375 g  3.375 g Intravenous Q8H Bettey Costa, MD   3.375 g at 11/06/15 0641  . potassium chloride (KLOR-CON) packet 20 mEq  20 mEq Oral TID Dia Crawford III, MD   20 mEq at 11/05/15 2127  . senna-docusate (Senokot-S) tablet 1 tablet  1 tablet Oral QHS PRN Bettey Costa, MD      . sodium chloride flush (NS) 0.9 % injection 3 mL  3 mL Intravenous Q12H Bettey Costa, MD   3 mL at 11/06/15 1118  . temazepam (RESTORIL) capsule 15 mg  15 mg Oral QHS PRN Gonzella Lex, MD      . thiamine (VITAMIN B-1) tablet 100 mg  100 mg Oral Daily Sital Mody, MD   100 mg at 11/05/15 9390   Or  . thiamine (B-1) injection 100 mg  100 mg Intravenous Daily Sital Mody, MD      . traMADol (ULTRAM) tablet 50 mg  50 mg Oral Q6H PRN Bettey Costa, MD        Musculoskeletal: Strength & Muscle Tone: within normal limits Gait & Station: normal Patient leans: N/A  Psychiatric Specialty Exam: Physical Exam  Nursing note and vitals reviewed. Constitutional: He appears well-developed and well-nourished.  HENT:  Head: Normocephalic and atraumatic.  Eyes: Conjunctivae are normal. Pupils are equal, round, and reactive to light.  Neck: Normal range of motion.  Cardiovascular: Normal heart sounds.   Respiratory: Effort normal. No respiratory distress.  GI: Soft.  Musculoskeletal: Normal range of motion.  Neurological: He is alert.  Skin: Skin is warm and dry.  Psychiatric: His speech is normal and behavior is normal. Judgment and thought content normal. His mood appears anxious. Cognition and memory are normal.    Review of Systems  Constitutional: Negative.   HENT: Negative.   Eyes: Negative.   Respiratory: Negative.   Cardiovascular: Negative.   Gastrointestinal: Negative.   Musculoskeletal: Negative.   Skin: Negative.   Neurological: Negative.   Psychiatric/Behavioral: Positive for depression. Negative for hallucinations, memory loss, substance  abuse and suicidal ideas. The patient is nervous/anxious and has insomnia.     Blood pressure (!) 157/69, pulse 96, temperature 97.6 F (36.4 C), temperature source Oral, resp. rate 18, height _0  (1.702 m), weight 94.1 kg (207 lb 6.4 oz), SpO2 95 %.Body mass index is 32.48 kg/m.  General Appearance: Fairly Groomed  Eye Contact:  Fair  Speech:  Normal Rate  Volume:  Normal  Mood:  Anxious, Depressed and Irritable  Affect:  Congruent  Thought Process:  Goal Directed  Orientation:  Full (Time, Place, and Person)  Thought Content:  Logical  Suicidal Thoughts:  No  Homicidal Thoughts:  No  Memory:  Immediate;   Good Recent;   Good Remote;   Good  Judgement:  Fair  Insight:  Fair  Psychomotor Activity:  Decreased  Concentration:  Concentration: Fair  Recall:  [employer] of Knowledge:  Fair  Language:  Fair  Akathisia:  No  Handed:  Right  AIMS (if indicated):     Assets:  Communication Skills Desire for Improvement Financial Resources/Insurance Housing Intimacy Resilience Social Support  ADL's:  Intact  Cognition:  WNL  Sleep:        Treatment Plan Summary: Medication management and Plan 65 year old man with symptoms of depression and anxiety. Depression of a moderate degree. Not suicidal. Does not require inpatient hospital treatment. I recommend restarting an antidepressant based on his symptoms as well as his upcoming cardiac problems. I recommend starting Lexapro 10 mg a day and have put in an order for that. I have also put in an order for Restoril 15 mg at night as needed for sleep. Apparently he has been using Xanax as a when necessary for sleep at home intermittently. Restoril is generally a better choice as a hypnotic. I tried to give him lots of support and reassurance that there was nothing crazy about his feelings or his situation. I also reassured him that his opinion is correct that he does not have an alcohol dependence or even an alcohol abuse problem. I  educated him that he does drink above the level usually consider the cutoff for heavy drinking but that it's not clear that it's causing any specific problems. Patient is reassured by this. I did not discontinue the detox protocol but I doubt very much that any of it would be needed. I will follow-up if he is continuing to be in the hospital.  Disposition: Patient does not meet criteria for psychiatric inpatient admission. Supportive therapy provided about ongoing stressors.  Alethia Berthold, MD 11/06/2015 7:52 PM

## 2015-11-06 NOTE — Progress Notes (Signed)
[facility]  ROUNDING NOTE   Subjective:  Patient due for cholecystectomy today. Creatinine has come down to 1.75. Overall in good spirits still appears to be anxious.   Objective:  Vital signs in last 24 hours:  Temp:  [98.1 F (36.7 C)-98.5 F (36.9 C)] 98.1 F (36.7 C) (08/16 1116) Pulse Rate:  [71-81] 71 (08/16 1116) Resp:  [16-19] 18 (08/16 1116) BP: (139-156)/(56) 139/56 (08/16 1116) SpO2:  [87 %-95 %] 95 % (08/16 1116)  Weight change:  Filed Weights   11/04/15 0839 11/04/15 1230  Weight: 95.3 kg (210 lb) 94.1 kg (207 lb 6.4 oz)    Intake/Output: I/O last 3 completed shifts: In: 5941.8 [P.O.:600; I.V.:4891.8; IV Piggyback:450] Out: 2550 [Urine:2550]   Intake/Output this shift:  Total I/O In: 621.2 [I.V.:621.2] Out: 0   Physical Exam: General: No acute distress  Head: Normocephalic, atraumatic. Moist oral mucosal membranes  Eyes: Anicteric  Neck: Supple, trachea midline  Lungs:  Clear to auscultation, normal effort  Heart: S1S2 no rubs  Abdomen:  Soft, Right upper quadrant tenderness noted  Extremities: No peripheral edema.  Neurologic: Nonfocal, moving all four extremities  Skin: No lesions  Access:     Basic Metabolic Panel:  Recent Labs Lab 11/04/15 0841 11/04/15 0842 11/05/15 0450 11/06/15 0344  NA 121*  --  125* 127*  K 4.8  --  3.7 4.0  CL 87*  --  92* 97*  CO2 22  --  23 24  GLUCOSE 209*  --  124* 177*  BUN 31*  --  35* 24*  CREATININE 2.40*  --  2.10* 1.75*  CALCIUM 8.7*  --  7.6* 8.0*  MG  --  1.9  --   --     Liver Function Tests:  Recent Labs Lab 11/04/15 0841 11/05/15 0450  AST 28 17  ALT 22 20  ALKPHOS 55 66  BILITOT 1.8* 1.1  PROT 6.9 5.6*  ALBUMIN 3.5 2.7*    Recent Labs Lab 11/04/15 0841  LIPASE 18   No results for input(s): AMMONIA in the last 168 hours.  CBC:  Recent Labs Lab 11/04/15 0841 11/05/15 0450 11/06/15 0344  WBC 24.2* 13.6* 12.7*  NEUTROABS 22.6*  --   --   HGB 14.6 12.1*  12.5*  HCT 40.9 34.1* 36.0*  MCV 96.2 95.8 97.0  PLT 176 128* 144*    Cardiac Enzymes:  Recent Labs Lab 11/04/15 0841 11/04/15 1257 11/04/15 2355 11/05/15 0607  TROPONINI 0.27* 0.04* 0.11* 0.04*    BNP: Invalid input(s): POCBNP  CBG:  Recent Labs Lab 11/05/15 1138 11/05/15 1701 11/05/15 2121 11/06/15 0722 11/06/15 1113  GLUCAP 156* 162* 171* 170* 161*    Microbiology: Results for orders placed or performed in visit on 02/10/11  Fecal occult blood, imunochemical     Status: None   Collection Time: 02/10/11 11:21 AM  Result Value Ref Range Status   Fecal Occult Bld Negative Negative Final    Coagulation Studies:  Recent Labs  11/04/15 0841  LABPROT 14.7  INR 1.14    Urinalysis:  Recent Labs  11/04/15 2123  COLORURINE YELLOW*  LABSPEC 1.013  PHURINE 6.0  GLUCOSEU >500*  HGBUR NEGATIVE  BILIRUBINUR NEGATIVE  KETONESUR NEGATIVE  PROTEINUR NEGATIVE  NITRITE NEGATIVE  LEUKOCYTESUR NEGATIVE      Imaging: US Renal  Result Date: 11/04/2015 CLINICAL DATA:  Acute renal failure. EXAM: RENAL / URINARY TRACT ULTRASOUND COMPLETE COMPARISON:  None. FINDINGS: Right Kidney: Length: 12.4 cm. Echogenicity within normal limits. No  mass or hydronephrosis visualized. Left Kidney: Length: 11.1 cm. Echogenicity within normal limits. No mass or hydronephrosis visualized. Bladder: Appears normal for degree of bladder distention. IMPRESSION: Negative. Electronically Signed   By: Staci Righter M.D.   On: 11/04/2015 17:31   Nm Myocar Multi W/spect W/wall Motion / Ef  Result Date: 11/06/2015 Pharmacological myocardial perfusion imaging study with moderate sized region of moderate intensity ischemia in the lateral wall Normal wall motion, EF estimated at 68% No EKG changes concerning for ischemia at peak stress or in recovery. High risk scan given lateral wall ischemia, given recent troponin elevation Signed, Esmond Plants, MD, Ph.D [facility]     Medications:   .  sodium chloride 150 mL/hr at 11/06/15 1350  . [START ON 11/07/2015] sodium chloride     Followed by  . [START ON 11/07/2015] sodium chloride     . [START ON 11/07/2015] aspirin  81 mg Oral Pre-Cath  . atorvastatin  40 mg Oral q1800  . diltiazem  30 mg Oral G6K  . folic acid  1 mg Oral Daily  . insulin aspart  0-5 Units Subcutaneous QHS  . insulin aspart  0-9 Units Subcutaneous TID WC  . levothyroxine  100 mcg Oral QAC breakfast  . LORazepam  0-4 mg Oral Q12H  . multivitamin with minerals  1 tablet Oral Daily  . pantoprazole  40 mg Oral Daily  . piperacillin-tazobactam (ZOSYN)  IV  3.375 g Intravenous Q8H  . potassium chloride  20 mEq Oral TID  . sodium chloride flush  3 mL Intravenous Q12H  . thiamine  100 mg Oral Daily   Or  . thiamine  100 mg Intravenous Daily   acetaminophen **OR** acetaminophen, HYDROcodone-homatropine, ketorolac, LORazepam, LORazepam **OR** LORazepam, ondansetron **OR** ondansetron (ZOFRAN) IV, senna-docusate, traMADol  Assessment/ Plan:  65 y.o. male with a PMHx of GERD, hyperlipidemia, hypertension, hypothyroidism, diabetes mellitus type 2, who was admitted to [name] on 11/04/2015 for evaluation of abdominal pain. Patient reports that the symptoms began this past Saturday.  1.  Acute renal failure/chronic kidney disease stage II baseline creatinine 1.1 EGFR 68.  Acute renal failure secondary to sepsis from cholecystitis, use of BC powder, poor by mouth intake and losartan/hydrochlorothiazide.  CT scan of the abdomen and pelvis revealed bilateral renal cortical thinning but no hydronephrosis.   -  Creatinine currently down to 1.75.  Good urine output noted.  Continue IV fluid hydration.  Avoid intraoperative hypotension if possible.  2.  Hyponatremia.   serum sodium up to 127.  Continue IV fluid hydration with 0.9 normal saline.  3.  Acute cholecystitis.   Patient completed stress test.  He is due for cholecystectomy today.  Continue intravenous antibiotics.    LOS: 2 Brian Casey 8/16/20172:29 PM

## 2015-11-06 NOTE — Care Management (Addendum)
Spoke with Jearl Klinefelter at the [place]. He states patient has no [facility] service connection. There will not be a problem with patient proceeding with surgery or cath. Patient and wife updated.

## 2015-11-06 NOTE — Care Management (Signed)
Spoke with Dr. Rockey Situ. He states patient is in need of a cath today and to unstable to transfer to another facility. Patient sates he is a [facility] patient and he is concerned about his insurance. TC to the  [facility], left message for [employer] for return call

## 2015-11-06 NOTE — Progress Notes (Signed)
Patient: Brian Casey / Admit Date: 11/04/2015 / Date of Encounter: 11/06/2015, 1:42 PM   Subjective: Patient had stress test this morning Lateral wall ischemia Results discussed with patient, daughter, wife Long discussion concerning the results, whether to do cardiac catheterization, timing of percutaneous intervention on his gallbladder. He is upset about any delay in taking his gallbladder out. After long discussion, he is willing to have catheterization today. He realizes that without catheterization, surgery would likely not be an option.  Case discussed with Dr. Pat Patrick, He is in agreement, proceed with catheterization place a stent if needed  Review of Systems: Review of Systems  Constitutional: Negative.   Respiratory: Negative.   Cardiovascular: Negative.   Gastrointestinal: Positive for abdominal pain.  Musculoskeletal: Negative.   Neurological: Negative.   Psychiatric/Behavioral: Negative.   All other systems reviewed and are negative.  All other systems reviewed and negative.   Objective: Telemetry:  Physical Exam: Blood pressure (!) 139/56, pulse 71, temperature 98.1 F (36.7 C), temperature source Oral, resp. rate 18, height _0  (1.702 m), weight 207 lb 6.4 oz (94.1 kg), SpO2 95 %. Body mass index is 32.48 kg/m. General: Well developed, well nourished, in no acute distress. Head: Normocephalic, atraumatic, sclera non-icteric, no xanthomas, nares are without discharge. Neck: Negative for carotid bruits. JVP not elevated. Lungs: Clear bilaterally to auscultation without wheezes, rales, or rhonchi. Breathing is unlabored. Heart: RRR S1 S2 without murmurs, rubs, or gallops.  Abdomen: Soft, mildly tender to palpation right upper quadrant, non-distended with normoactive bowel sounds. No rebound/guarding. Extremities: No clubbing or cyanosis. No edema. Distal pedal pulses are 2+ and equal bilaterally. Neuro: Alert and oriented X 3. Moves all extremities  spontaneously. Psych:  Responds to questions appropriately with a normal affect.   Intake/Output Summary (Last 24 hours) at 11/06/15 1342 Last data filed at 11/06/15 1200  Gross per 24 hour  Intake          4068.89 ml  Output             1150 ml  Net          2918.89 ml    Inpatient Medications:  . atorvastatin  40 mg Oral q1800  . diltiazem  30 mg Oral O8C  . folic acid  1 mg Oral Daily  . insulin aspart  0-5 Units Subcutaneous QHS  . insulin aspart  0-9 Units Subcutaneous TID WC  . levothyroxine  100 mcg Oral QAC breakfast  . LORazepam  0-4 mg Oral Q12H  . multivitamin with minerals  1 tablet Oral Daily  . pantoprazole  40 mg Oral Daily  . piperacillin-tazobactam (ZOSYN)  IV  3.375 g Intravenous Q8H  . potassium chloride  20 mEq Oral TID  . sodium chloride flush  3 mL Intravenous Q12H  . thiamine  100 mg Oral Daily   Or  . thiamine  100 mg Intravenous Daily   Infusions:  . sodium chloride 100 mL/hr at 11/06/15 0845  . heparin 1,800 Units/hr (11/05/15 2129)    Labs:  Recent Labs  11/04/15 0842 11/05/15 0450 11/06/15 0344  NA  --  125* 127*  K  --  3.7 4.0  CL  --  92* 97*  CO2  --  23 24  GLUCOSE  --  124* 177*  BUN  --  35* 24*  CREATININE  --  2.10* 1.75*  CALCIUM  --  7.6* 8.0*  MG 1.9  --   --  Recent Labs  11/04/15 0841 11/05/15 0450  AST 28 17  ALT 22 20  ALKPHOS 55 66  BILITOT 1.8* 1.1  PROT 6.9 5.6*  ALBUMIN 3.5 2.7*    Recent Labs  11/04/15 0841 11/05/15 0450 11/06/15 0344  WBC 24.2* 13.6* 12.7*  NEUTROABS 22.6*  --   --   HGB 14.6 12.1* 12.5*  HCT 40.9 34.1* 36.0*  MCV 96.2 95.8 97.0  PLT 176 128* 144*    Recent Labs  11/04/15 0841 11/04/15 1257 11/04/15 2355 11/05/15 0607  TROPONINI 0.27* 0.04* 0.11* 0.04*   Invalid input(s): POCBNP  Recent Labs  11/04/15 1257  HGBA1C 6.4*     Weights: Filed Weights   11/04/15 0839 11/04/15 1230  Weight: 210 lb (95.3 kg) 207 lb 6.4 oz (94.1 kg)     Radiology/Studies:    Ct Abdomen Pelvis Wo Contrast  Result Date: 11/04/2015 CLINICAL DATA:  Right lower quadrant pain extending into the right upper and central abdomen. Symptoms for 2 days. History of hernia repair. EXAM: CT ABDOMEN AND PELVIS WITHOUT CONTRAST TECHNIQUE: Multidetector CT imaging of the abdomen and pelvis was performed following the standard protocol without IV contrast. COMPARISON:  None. FINDINGS: Lower chest: Mild dependent airspace opacities at both lung bases, probably atelectasis. No significant pleural or pericardial effusion. Prominent coronary artery atherosclerosis noted. There is a small hiatal hernia. Hepatobiliary: The hepatic density is diffusely decreased, consistent with steatosis. No focal hepatic abnormalities are identified on noncontrast imaging. The gallbladder is distended with wall thickening and prominent surrounding inflammatory changes. No calcified gallstones are seen. There is no significant biliary dilatation. Pancreas: Unremarkable. No pancreatic ductal dilatation or surrounding inflammatory changes. Spleen: Normal in size without focal abnormality. Adrenals/Urinary Tract: Both adrenal glands appear normal. Both kidneys demonstrate mild cortical thinning and symmetric perinephric soft tissue stranding. 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 No evidence of renal mass, hydronephrosis or urinary tract calculus. The bladder appears unremarkable. Stomach/Bowel: No evidence bowel wall thickening or distention. The right upper quadrant inflammatory changes do not appear to arise from the duodenum. The appendix appears normal. There is mild distal colonic diverticulosis. Vascular/Lymphatic: There are no enlarged abdominal or pelvic lymph nodes. Aortic and branch vessel atherosclerosis noted. Vascular assessment limited without contrast. Reproductive: There is a focal parenchymal calcification within the right aspect of the prostate gland which is not  significantly enlarged. The seminal vesicles appear normal. Other: No evidence of abdominal wall mass or hernia. Musculoskeletal: No acute or significant osseous findings. IMPRESSION: 1. Distended gallbladder with wall thickening and surrounding inflammation highly suspicious for acute cholecystitis. Correlate clinically. No calcified gallstones or biliary dilatation identified. 2. No other acute findings are seen.  The appendix appears normal. 3. Bibasilar atelectasis, hepatic steatosis and mild distal colonic diverticulosis. 4.  Aortic Atherosclerosis (ICD10-170.0) Electronically Signed   By: Richardean Sale M.D.   On: 11/04/2015 10:17   US Renal  Result Date: 11/04/2015 CLINICAL DATA:  Acute renal failure. EXAM: RENAL / URINARY TRACT ULTRASOUND COMPLETE COMPARISON:  None. FINDINGS: Right Kidney: Length: 12.4 cm. Echogenicity within normal limits. No mass or hydronephrosis visualized. Left Kidney: Length: 11.1 cm. Echogenicity within normal limits. No mass or hydronephrosis visualized. Bladder: Appears normal for degree of bladder distention. IMPRESSION: Negative. Electronically Signed   By: Staci Righter M.D.   On: 11/04/2015 17:31   Nm Myocar Multi W/spect W/wall Motion / Ef  Result Date: 11/06/2015 Pharmacological myocardial perfusion imaging study with moderate sized region of  moderate intensity ischemia in the lateral wall Normal wall motion, EF estimated at 68% No EKG changes concerning for ischemia at peak stress or in recovery. High risk scan given lateral wall ischemia, given recent troponin elevation Signed, Esmond Plants, MD, Ph.D [facility]   US Abdomen Limited Ruq  Result Date: 11/04/2015 CLINICAL DATA:  Right-sided abdominal pain for 2 days.  Abnormal CT. EXAM: US ABDOMEN LIMITED - RIGHT UPPER QUADRANT COMPARISON:  CT 11/04/2015. FINDINGS: Gallbladder: Gallbladder wall thickening to 7 mm is noted at the gallbladder fundus. Gallbladder sludge is present. No discrete gallstones are seen.  Sonographic Percell Miller sign is absent according to the sonographer. Common bile duct: Diameter: 5 mm. Liver: Increased echogenicity corresponding with steatosis on CT. No focal hepatic abnormalities identified. IMPRESSION: 1. Gallbladder wall thickening and sludge without gallstones, sonographic Murphy sign or biliary dilatation. 2. I reviewed the CT from earlier today, and the inflammatory changes on that study are in the right upper quadrant with the epicenter at the gallbladder. In conjunction with that study, findings still remain concerning for acalculous cholecystitis. The patient's appendix does lie in the right upper quadrant adjacent to the gallbladder, but appears normal. There is no apparent wall thickening of the duodenum or colon. The pancreas appears normal, and the patient does not have an elevated lipase level, although does have leukocytosis. Electronically Signed   By: Richardean Sale M.D.   On: 11/04/2015 11:35     Assessment and Plan  65 y.o. male     cad,  positive stress test, elevated troponin, Long discussion concerning risk and benefit, timing of cardiac catheterization Discussed with daughter and wife I have reviewed the risks, indications, and alternatives to cardiac catheterization, possible angioplasty, and stenting with the patient. Risks include but are not limited to bleeding, infection, vascular injury, stroke, myocardial infection, arrhythmia, kidney injury, radiation-related injury in the case of prolonged fluoroscopy use, emergency cardiac surgery, and death. The patient understands the risks of serious complication is 1-2 in 3007 with diagnostic cardiac cath and 1-2% or less with angioplasty/stenting.   Hyponatremia Sodium improved up to 127, creatinine down to 1.75 Would continue normal saline  Acute renal failure Likely secondary to dehydration from not eating very much Would continue normal saline IV fluid  Atrial fibrillation Maintaining normal sinus  rhythm diltiazem by mouth  Acute cholecystitis Discussed with Dr. Pat Patrick, Proceed with catheterization today, clear him from a cardiac perspective and then focus on gallbladder May need percutaneous gallbladder intervention  Greater than 50% was spent in counseling and coordination of care with patient Total encounter time 35 minutes or more  Signed, Esmond Plants, MD, Ph.D. [facility] 11/06/2015, 1:42 PM

## 2015-11-07 ENCOUNTER — Inpatient Hospital Stay (HOSPITAL_COMMUNITY)
Admission: AD | Admit: 2015-11-07 | Discharge: 2015-11-21 | DRG: 235 | Disposition: A | Discharge status: Home / Self Care | Payer: Managed Care, Other (non HMO) | Source: Other Acute Inpatient Hospital | Attending: Cardiothoracic Surgery | Admitting: Cardiothoracic Surgery

## 2015-11-07 ENCOUNTER — Encounter: Payer: Self-pay | Admitting: Cardiovascular Disease

## 2015-11-07 ENCOUNTER — Inpatient Hospital Stay (HOSPITAL_COMMUNITY): Payer: Managed Care, Other (non HMO)

## 2015-11-07 ENCOUNTER — Other Ambulatory Visit: Payer: Self-pay | Admitting: *Deleted

## 2015-11-07 DIAGNOSIS — E1165 Type 2 diabetes mellitus with hyperglycemia: Secondary | ICD-10-CM | POA: Diagnosis present

## 2015-11-07 DIAGNOSIS — E039 Hypothyroidism, unspecified: Secondary | ICD-10-CM | POA: Diagnosis present

## 2015-11-07 DIAGNOSIS — I2510 Atherosclerotic heart disease of native coronary artery without angina pectoris: Secondary | ICD-10-CM

## 2015-11-07 DIAGNOSIS — Z7982 Long term (current) use of aspirin: Secondary | ICD-10-CM | POA: Diagnosis not present

## 2015-11-07 DIAGNOSIS — I10 Essential (primary) hypertension: Secondary | ICD-10-CM | POA: Diagnosis present

## 2015-11-07 DIAGNOSIS — K760 Fatty (change of) liver, not elsewhere classified: Secondary | ICD-10-CM | POA: Diagnosis present

## 2015-11-07 DIAGNOSIS — E7800 Pure hypercholesterolemia, unspecified: Secondary | ICD-10-CM | POA: Diagnosis present

## 2015-11-07 DIAGNOSIS — F321 Major depressive disorder, single episode, moderate: Secondary | ICD-10-CM | POA: Diagnosis present

## 2015-11-07 DIAGNOSIS — I214 Non-ST elevation (NSTEMI) myocardial infarction: Secondary | ICD-10-CM | POA: Diagnosis present

## 2015-11-07 DIAGNOSIS — I700 Atherosclerosis of aorta: Secondary | ICD-10-CM | POA: Diagnosis present

## 2015-11-07 DIAGNOSIS — F4321 Adjustment disorder with depressed mood: Secondary | ICD-10-CM | POA: Diagnosis present

## 2015-11-07 DIAGNOSIS — J90 Pleural effusion, not elsewhere classified: Secondary | ICD-10-CM | POA: Diagnosis present

## 2015-11-07 DIAGNOSIS — Z951 Presence of aortocoronary bypass graft: Secondary | ICD-10-CM

## 2015-11-07 DIAGNOSIS — A4150 Gram-negative sepsis, unspecified: Secondary | ICD-10-CM | POA: Diagnosis not present

## 2015-11-07 DIAGNOSIS — E8770 Fluid overload, unspecified: Secondary | ICD-10-CM | POA: Diagnosis not present

## 2015-11-07 DIAGNOSIS — R079 Chest pain, unspecified: Secondary | ICD-10-CM

## 2015-11-07 DIAGNOSIS — E0781 Sick-euthyroid syndrome: Secondary | ICD-10-CM | POA: Diagnosis present

## 2015-11-07 DIAGNOSIS — Z79899 Other long term (current) drug therapy: Secondary | ICD-10-CM

## 2015-11-07 DIAGNOSIS — I130 Hypertensive heart and chronic kidney disease with heart failure and stage 1 through stage 4 chronic kidney disease, or unspecified chronic kidney disease: Secondary | ICD-10-CM | POA: Diagnosis present

## 2015-11-07 DIAGNOSIS — F1010 Alcohol abuse, uncomplicated: Secondary | ICD-10-CM | POA: Diagnosis present

## 2015-11-07 DIAGNOSIS — I480 Paroxysmal atrial fibrillation: Secondary | ICD-10-CM | POA: Diagnosis not present

## 2015-11-07 DIAGNOSIS — K219 Gastro-esophageal reflux disease without esophagitis: Secondary | ICD-10-CM | POA: Diagnosis present

## 2015-11-07 DIAGNOSIS — I779 Disorder of arteries and arterioles, unspecified: Secondary | ICD-10-CM

## 2015-11-07 DIAGNOSIS — K810 Acute cholecystitis: Secondary | ICD-10-CM | POA: Diagnosis present

## 2015-11-07 DIAGNOSIS — R9439 Abnormal result of other cardiovascular function study: Secondary | ICD-10-CM | POA: Diagnosis present

## 2015-11-07 DIAGNOSIS — F17200 Nicotine dependence, unspecified, uncomplicated: Secondary | ICD-10-CM | POA: Diagnosis present

## 2015-11-07 DIAGNOSIS — N182 Chronic kidney disease, stage 2 (mild): Secondary | ICD-10-CM | POA: Diagnosis present

## 2015-11-07 DIAGNOSIS — D62 Acute posthemorrhagic anemia: Secondary | ICD-10-CM | POA: Diagnosis not present

## 2015-11-07 DIAGNOSIS — E43 Unspecified severe protein-calorie malnutrition: Secondary | ICD-10-CM | POA: Diagnosis present

## 2015-11-07 DIAGNOSIS — I6522 Occlusion and stenosis of left carotid artery: Secondary | ICD-10-CM | POA: Diagnosis not present

## 2015-11-07 DIAGNOSIS — I6523 Occlusion and stenosis of bilateral carotid arteries: Secondary | ICD-10-CM | POA: Diagnosis present

## 2015-11-07 DIAGNOSIS — E1122 Type 2 diabetes mellitus with diabetic chronic kidney disease: Secondary | ICD-10-CM | POA: Diagnosis present

## 2015-11-07 DIAGNOSIS — F17210 Nicotine dependence, cigarettes, uncomplicated: Secondary | ICD-10-CM | POA: Diagnosis present

## 2015-11-07 DIAGNOSIS — J9811 Atelectasis: Secondary | ICD-10-CM | POA: Diagnosis not present

## 2015-11-07 DIAGNOSIS — I25119 Atherosclerotic heart disease of native coronary artery with unspecified angina pectoris: Secondary | ICD-10-CM | POA: Diagnosis present

## 2015-11-07 DIAGNOSIS — F411 Generalized anxiety disorder: Secondary | ICD-10-CM | POA: Diagnosis not present

## 2015-11-07 DIAGNOSIS — E785 Hyperlipidemia, unspecified: Secondary | ICD-10-CM | POA: Diagnosis not present

## 2015-11-07 DIAGNOSIS — I739 Peripheral vascular disease, unspecified: Secondary | ICD-10-CM

## 2015-11-07 DIAGNOSIS — E871 Hypo-osmolality and hyponatremia: Secondary | ICD-10-CM | POA: Diagnosis present

## 2015-11-07 DIAGNOSIS — IMO0002 Reserved for concepts with insufficient information to code with codable children: Secondary | ICD-10-CM | POA: Diagnosis present

## 2015-11-07 DIAGNOSIS — N179 Acute kidney failure, unspecified: Secondary | ICD-10-CM | POA: Diagnosis present

## 2015-11-07 DIAGNOSIS — K819 Cholecystitis, unspecified: Secondary | ICD-10-CM | POA: Diagnosis not present

## 2015-11-07 DIAGNOSIS — I5022 Chronic systolic (congestive) heart failure: Secondary | ICD-10-CM | POA: Diagnosis present

## 2015-11-07 DIAGNOSIS — I4891 Unspecified atrial fibrillation: Secondary | ICD-10-CM | POA: Diagnosis not present

## 2015-11-07 DIAGNOSIS — R1011 Right upper quadrant pain: Secondary | ICD-10-CM | POA: Diagnosis present

## 2015-11-07 DIAGNOSIS — A419 Sepsis, unspecified organism: Secondary | ICD-10-CM

## 2015-11-07 DIAGNOSIS — Z01810 Encounter for preprocedural cardiovascular examination: Secondary | ICD-10-CM | POA: Diagnosis not present

## 2015-11-07 DIAGNOSIS — J449 Chronic obstructive pulmonary disease, unspecified: Secondary | ICD-10-CM | POA: Diagnosis present

## 2015-11-07 DIAGNOSIS — Z7984 Long term (current) use of oral hypoglycemic drugs: Secondary | ICD-10-CM

## 2015-11-07 DIAGNOSIS — S04892A Injury of other cranial nerves, left side, initial encounter: Secondary | ICD-10-CM | POA: Diagnosis not present

## 2015-11-07 DIAGNOSIS — I471 Supraventricular tachycardia: Secondary | ICD-10-CM | POA: Diagnosis present

## 2015-11-07 DIAGNOSIS — I6529 Occlusion and stenosis of unspecified carotid artery: Secondary | ICD-10-CM | POA: Diagnosis present

## 2015-11-07 DIAGNOSIS — I25110 Atherosclerotic heart disease of native coronary artery with unstable angina pectoris: Secondary | ICD-10-CM | POA: Diagnosis not present

## 2015-11-07 DIAGNOSIS — Z6834 Body mass index (BMI) 34.0-34.9, adult: Secondary | ICD-10-CM

## 2015-11-07 DIAGNOSIS — I519 Heart disease, unspecified: Secondary | ICD-10-CM | POA: Diagnosis present

## 2015-11-07 DIAGNOSIS — R0602 Shortness of breath: Secondary | ICD-10-CM

## 2015-11-07 HISTORY — DX: Unspecified atrial fibrillation: I48.91

## 2015-11-07 HISTORY — DX: Anxiety disorder, unspecified: F41.9

## 2015-11-07 HISTORY — DX: Thyrotoxicosis, unspecified without thyrotoxic crisis or storm: E05.90

## 2015-11-07 HISTORY — DX: Non-ST elevation (NSTEMI) myocardial infarction: I21.4

## 2015-11-07 HISTORY — DX: Acute kidney failure, unspecified: N17.9

## 2015-11-07 HISTORY — DX: Unspecified osteoarthritis, unspecified site: M19.90

## 2015-11-07 LAB — BASIC METABOLIC PANEL
Anion gap: 7 (ref 5–15)
BUN: 16 mg/dL (ref 6–20)
CHLORIDE: 102 mmol/L (ref 101–111)
CO2: 22 mmol/L (ref 22–32)
CREATININE: 1.24 mg/dL (ref 0.61–1.24)
Calcium: 7.4 mg/dL — ABNORMAL LOW (ref 8.9–10.3)
GFR calc non Af Amer: >60 mL/min (ref >60)
Glucose, Bld: 150 mg/dL — ABNORMAL HIGH (ref 65–99)
Potassium: 3.7 mmol/L (ref 3.5–5.1)
Sodium: 131 mmol/L — ABNORMAL LOW (ref 135–145)

## 2015-11-07 LAB — HEPARIN LEVEL (UNFRACTIONATED)
Heparin Unfractionated: <0.1 IU/mL — ABNORMAL LOW (ref 0.30–0.70)
Heparin Unfractionated: 0.27 IU/mL — ABNORMAL LOW (ref 0.30–0.70)

## 2015-11-07 LAB — CBC
HCT: 31.8 % — ABNORMAL LOW (ref 40.0–52.0)
Hemoglobin: 11.5 g/dL — ABNORMAL LOW (ref 13.0–18.0)
MCH: 34.6 pg — AB (ref 26.0–34.0)
MCHC: 36.1 g/dL — ABNORMAL HIGH (ref 32.0–36.0)
MCV: 95.9 fL (ref 80.0–100.0)
PLATELETS: 144 10*3/uL — AB (ref 150–440)
RBC: 3.31 MIL/uL — AB (ref 4.40–5.90)
RDW: 13.2 % (ref 11.5–14.5)
WBC: 12.3 10*3/uL — ABNORMAL HIGH (ref 3.8–10.6)

## 2015-11-07 LAB — GLUCOSE, CAPILLARY
GLUCOSE-CAPILLARY: 153 mg/dL — AB (ref 65–99)
GLUCOSE-CAPILLARY: 124 mg/dL — AB (ref 65–99)
GLUCOSE-CAPILLARY: 127 mg/dL — AB (ref 65–99)
Glucose-Capillary: 142 mg/dL — ABNORMAL HIGH (ref 65–99)

## 2015-11-07 MED ORDER — INSULIN ASPART 100 UNIT/ML ~~LOC~~ SOLN: 0.0000 [IU] | Freq: Three times a day (TID) | SUBCUTANEOUS | Status: DC

## 2015-11-07 MED ORDER — AMIODARONE HCL 200 MG PO TABS
400.0000 mg | ORAL_TABLET | Freq: Two times a day (BID) | ORAL | Status: DC
  Administered 2015-11-07: 400 mg via ORAL
  Filled 2015-11-07: qty 2

## 2015-11-07 MED ORDER — ESCITALOPRAM OXALATE 10 MG PO TABS
10.0000 mg | ORAL_TABLET | Freq: Every day | ORAL | Status: DC
  Administered 2015-11-08 – 2015-11-21 (×13): 10 mg via ORAL
  Filled 2015-11-07 (×13): qty 1

## 2015-11-07 MED ORDER — DILTIAZEM HCL 30 MG PO TABS: 30.0000 mg | ORAL_TABLET | Freq: Three times a day (TID) | ORAL | 0 refills | Status: DC

## 2015-11-07 MED ORDER — ADULT MULTIVITAMIN W/MINERALS CH
1.0000 | ORAL_TABLET | Freq: Every day | ORAL | Status: DC
  Administered 2015-11-07: 1 via ORAL
  Filled 2015-11-07: qty 1

## 2015-11-07 MED ORDER — SENNA 8.6 MG PO TABS: 1.0000 | ORAL_TABLET | Freq: Every evening | ORAL | Status: DC | PRN

## 2015-11-07 MED ORDER — PIPERACILLIN-TAZOBACTAM 3.375 G IVPB
3.3750 g | Freq: Three times a day (TID) | INTRAVENOUS | Status: DC
  Administered 2015-11-07: 3.375 g via INTRAVENOUS
  Filled 2015-11-07 (×2): qty 50

## 2015-11-07 MED ORDER — ATORVASTATIN CALCIUM 40 MG PO TABS
40.0000 mg | ORAL_TABLET | Freq: Every day | ORAL | Status: DC
  Administered 2015-11-07 – 2015-11-20 (×13): 40 mg via ORAL
  Filled 2015-11-07 (×13): qty 1

## 2015-11-07 MED ORDER — PANTOPRAZOLE SODIUM 40 MG PO TBEC
40.0000 mg | DELAYED_RELEASE_TABLET | Freq: Every day | ORAL | Status: DC
  Administered 2015-11-08 – 2015-11-13 (×6): 40 mg via ORAL
  Filled 2015-11-07 (×6): qty 1

## 2015-11-07 MED ORDER — ACETAMINOPHEN 325 MG PO TABS
650.0000 mg | ORAL_TABLET | ORAL | Status: DC | PRN
  Administered 2015-11-07 – 2015-11-08 (×3): 650 mg via ORAL
  Filled 2015-11-07 (×3): qty 2

## 2015-11-07 MED ORDER — ASPIRIN EC 81 MG PO TBEC
81.0000 mg | DELAYED_RELEASE_TABLET | Freq: Every day | ORAL | Status: DC
  Administered 2015-11-07 – 2015-11-13 (×7): 81 mg via ORAL
  Filled 2015-11-07 (×7): qty 1

## 2015-11-07 MED ORDER — AMIODARONE HCL 400 MG PO TABS: 400.0000 mg | ORAL_TABLET | Freq: Two times a day (BID) | ORAL | 0 refills | Status: DC

## 2015-11-07 MED ORDER — METOPROLOL TARTRATE 25 MG PO TABS
25.0000 mg | ORAL_TABLET | Freq: Two times a day (BID) | ORAL | Status: DC
  Administered 2015-11-07: 25 mg via ORAL
  Filled 2015-11-07: qty 1

## 2015-11-07 MED ORDER — FOLIC ACID 1 MG PO TABS
1.0000 mg | ORAL_TABLET | Freq: Every day | ORAL | Status: DC
  Administered 2015-11-07 – 2015-11-13 (×7): 1 mg via ORAL
  Filled 2015-11-07 (×8): qty 1

## 2015-11-07 MED ORDER — HEPARIN (PORCINE) IN NACL 100-0.45 UNIT/ML-% IJ SOLN
2150.0000 [IU]/h | INTRAMUSCULAR | Status: DC
  Administered 2015-11-07: 1950 [IU]/h via INTRAVENOUS
  Filled 2015-11-07 (×2): qty 250

## 2015-11-07 MED ORDER — VITAMIN B-1 100 MG PO TABS: 100.0000 mg | ORAL_TABLET | Freq: Every day | ORAL | Status: DC

## 2015-11-07 MED ORDER — LEVOTHYROXINE SODIUM 100 MCG PO TABS
100.0000 ug | ORAL_TABLET | Freq: Every day | ORAL | Status: DC
  Administered 2015-11-09 – 2015-11-21 (×12): 100 ug via ORAL
  Filled 2015-11-07 (×12): qty 1

## 2015-11-07 MED ORDER — DILTIAZEM HCL 30 MG PO TABS
30.0000 mg | ORAL_TABLET | Freq: Four times a day (QID) | ORAL | Status: DC
  Administered 2015-11-07 – 2015-11-08 (×3): 30 mg via ORAL
  Filled 2015-11-07 (×3): qty 1

## 2015-11-07 MED ORDER — AMIODARONE HCL 200 MG PO TABS
200.0000 mg | ORAL_TABLET | Freq: Two times a day (BID) | ORAL | Status: DC
  Administered 2015-11-07 – 2015-11-13 (×13): 200 mg via ORAL
  Filled 2015-11-07 (×13): qty 1

## 2015-11-07 MED ORDER — HEPARIN BOLUS VIA INFUSION
2550.0000 [IU] | Freq: Once | INTRAVENOUS | Status: DC
  Filled 2015-11-07: qty 2550

## 2015-11-07 MED ORDER — METOPROLOL TARTRATE 25 MG PO TABS: 25.0000 mg | ORAL_TABLET | Freq: Two times a day (BID) | ORAL | 0 refills | Status: DC

## 2015-11-07 MED ORDER — POTASSIUM CHLORIDE CRYS ER 20 MEQ PO TBCR
20.0000 meq | EXTENDED_RELEASE_TABLET | Freq: Two times a day (BID) | ORAL | Status: DC
  Administered 2015-11-07 – 2015-11-13 (×13): 20 meq via ORAL
  Filled 2015-11-07 (×13): qty 1

## 2015-11-07 MED ORDER — ESCITALOPRAM OXALATE 10 MG PO TABS: 10.0000 mg | ORAL_TABLET | Freq: Every day | ORAL | 0 refills | Status: DC

## 2015-11-07 MED ORDER — INSULIN ASPART 100 UNIT/ML ~~LOC~~ SOLN
0.0000 [IU] | Freq: Three times a day (TID) | SUBCUTANEOUS | Status: AC
  Administered 2015-11-08: 1 [IU] via SUBCUTANEOUS

## 2015-11-07 MED ORDER — NITROGLYCERIN 0.4 MG SL SUBL: 0.4000 mg | SUBLINGUAL_TABLET | SUBLINGUAL | Status: DC | PRN

## 2015-11-07 MED ORDER — LORAZEPAM 2 MG/ML IJ SOLN: 1.0000 mg | Freq: Four times a day (QID) | INTRAMUSCULAR | Status: AC | PRN

## 2015-11-07 MED ORDER — TEMAZEPAM 15 MG PO CAPS: 15.0000 mg | ORAL_CAPSULE | Freq: Every evening | ORAL | 0 refills | Status: DC | PRN

## 2015-11-07 MED ORDER — HEPARIN (PORCINE) IN NACL 100-0.45 UNIT/ML-% IJ SOLN: 1800.0000 [IU]/h | INTRAMUSCULAR | Status: DC

## 2015-11-07 MED ORDER — LORAZEPAM 1 MG PO TABS: 1.0000 mg | ORAL_TABLET | Freq: Four times a day (QID) | ORAL | Status: AC | PRN

## 2015-11-07 MED ORDER — FOLIC ACID 1 MG PO TABS: 1.0000 mg | ORAL_TABLET | Freq: Every day | ORAL | Status: DC

## 2015-11-07 MED ORDER — ONDANSETRON HCL 4 MG/2ML IJ SOLN
4.0000 mg | Freq: Four times a day (QID) | INTRAMUSCULAR | Status: DC | PRN
Start: 2015-11-07 — End: 2015-11-09

## 2015-11-07 MED ORDER — THIAMINE HCL 100 MG/ML IJ SOLN
100.0000 mg | Freq: Every day | INTRAMUSCULAR | Status: DC
  Administered 2015-11-15: 100 mg via INTRAVENOUS
  Filled 2015-11-07 (×4): qty 2

## 2015-11-07 MED ORDER — PIPERACILLIN-TAZOBACTAM 3.375 G IVPB
3.3750 g | Freq: Three times a day (TID) | INTRAVENOUS | Status: DC
  Administered 2015-11-08 – 2015-11-13 (×17): 3.375 g via INTRAVENOUS
  Filled 2015-11-07 (×20): qty 50

## 2015-11-07 MED ORDER — HEPARIN (PORCINE) IN NACL 100-0.45 UNIT/ML-% IJ SOLN
1800.0000 [IU]/h | INTRAMUSCULAR | Status: DC
  Administered 2015-11-07 (×2): 1550 [IU]/h via INTRAVENOUS
  Filled 2015-11-07 (×2): qty 250

## 2015-11-07 MED ORDER — VITAMIN B-1 100 MG PO TABS
100.0000 mg | ORAL_TABLET | Freq: Every day | ORAL | Status: DC
  Administered 2015-11-07 – 2015-11-17 (×9): 100 mg via ORAL
  Filled 2015-11-07 (×9): qty 1

## 2015-11-07 MED ORDER — ADULT MULTIVITAMIN W/MINERALS CH: 1.0000 | ORAL_TABLET | Freq: Every day | ORAL | Status: DC

## 2015-11-07 NOTE — Progress Notes (Signed)
Per Dr. Domenic Polite, pt has been seen by partner, Dr. Rockey Situ, this morning at [facility].  Plan has been discussed and agreed upon by team and pt.  Cardiothoracic surgery has been consulted but has not yet been in to see the pt tonight.  Family notified that surgeon will be in to see pt at some point tomorrow.

## 2015-11-07 NOTE — H&P (Signed)
History and Physical  Patient ID: Brian Casey MRN: [id number], DOB: 08-15-1950 Admit Date: (Not on file) Date of Encounter: 11/07/2015, 10:06 AM Primary Physician: [name], MD Primary Cardiologist: Dr. Rockey Situ, MD  Chief Complaint: RUQ pain Reason for Admission: New onset Afib with RVR/elevated troponin  HPI: 65 y.o. male with h/o DM2, HTN, HLD, hypothyroidisim, and chronic alcohol abuse presented to [facility] on 11/04/15 with RUQ abdominal and was found to have acute cholecystitis on CT scan as well as new onset Afib with RVR with heart rates in the 180's bpm with troponin elevation of 0.27-->0.04 without chest pain.    No prior known cardiac history. He has noted RUQ pain for 3 days prior to admission. No fevers or chills. Decreased PO intake. Pain worse with food consumption. Never with chest pain, SOB, palpitations, dizziness, presyncope, or syncope. Some nausea without vomiting. Never had pain like this before. He reports a very stressful job that has only gotten worse.   Upon his [facility] to [facility] ED he underwent CT abdomen/pelvis that showed acute cholecystitis. Abdominal ultrasound showed gallbladder wall thickening with sludge, no gallstones, negative Murphy's sign, no biliary dilatation. CT scan was re-reviewed by radiology and findings were still consistent with acute acalculus cholecystitis. He was also noted to be in new onset Afib with RVR with heart rates in the 170's bpm. Admission labs showed a WBC of 24,200, hgb 14.6, Na 121, K+ 4.8, SCr 2.40 (baseline of 1.00), BUN 31, T bili 1.8, lipase 18, initial troponin 0.27-->0.04. EKG showed Afib with RVR, 169 bpm, left axis, nonspecific st/t changes. He was started on Cardizem gtt and heparin infusion. He was also started on vancomycin/Zosyn by IM. He could not feel palpitations. No SOB, dizziness, presyncope, or syncope. Never with any chest pain. At time of initial cardiology consult heart rate was in the low 100's bpm, still in Afib.  BP improved from the low 202'B systolic to 343'H systolic. He was started on IV fluids for hyponatremia. TSH normal. He was seen by general surgery who recommended cholecystectomy after cardiac clearance. He underwent Lexiscan Myoview on 11/06/15 that was high risk with moderate sized region of moderate intensity in the lateral wall. EF 68%. Given his elevated troponin and abnormal nuclear stress test he underwent cardiac cath on 8/16 in the evening that showed severe 4-vessel CAD. Case was reviewed between Drs. Rockey Situ and Ellyn Hack who felt the patient needed transfer to [facility] for evaluation of CABG. In the peri-operative time period the patient went back into Afib. He was placed on amiodarone infusion and converted to sinus rhythm overnight. He was restarted on a heparin gtt as well in the evening of 8/16. He has remained without chest pain. He will be continued on Zosyn for coverage of his gallbladder disease with surgery following.   Past Medical History:  Diagnosis Date  . GERD (gastroesophageal reflux disease)   . High cholesterol   . Hypertension 10/17/2013  . Hypothyroidism   . Thyroid disease   . Type II diabetes mellitus ([place])      Most Recent Cardiac Studies: Cardiac cath 11/06/2015: Procedural Findings:  Coronary angiography:  Coronary dominance: Right  Left mainstem: Large vessel that bifurcates into the LAD and left circumflex, 40% distal left main disease  Left anterior descending (LAD): Large vessel that extends to the apical region, diagonal branch 2 of moderate size, 70% proximal LAD disease prior to a septal branch  Left circumflex (LCx): Large vessel with OM branch 2, critical 90%  ostial disease  Ramus/high OM: Moderate size vessel, Critical ostial disease estimated at 90%  Right coronary artery (RCA): Right dominant vessel with PL and PDA, severe/critical mid to distal disease estimated at 90-95%, also with suspected 60-70% proximal disease of a tortuous bend, at least  40-50% distal disease prior to the bifurcation  Left ventriculography: LV gram not performed given the contrast load  Total contrast 210 cc secondary to tremendous challenge engaging the ostium of the RCA. Dr Ellyn Hack was contacted and assisted in the case, finally obtaining adequate visualization of the RCA. Takeoff was very high anterior  Final Conclusions:  Four-vessel disease Moderate to severe proximal LAD disease, Critical ostial left circumflex and ramus disease Critical mid to distal RCA disease  Recommendations:  Discussed case with family and with Dr. Ellyn Hack, Recommendation made for CABG patient has indicated they would like to go to [place]  Echo 11/04/2015: Study Conclusions  - Left ventricle: The cavity size was normal. Systolic function was   normal. The estimated ejection fraction was in the range of 50%   to 55%. Wall motion was normal; there were no regional wall   motion abnormalities. Features are consistent with a pseudonormal   left ventricular filling pattern, with concomitant abnormal   relaxation and increased filling pressure (grade 2 diastolic   dysfunction). - Left atrium: The atrium was at the upper limits of normal in   size. - Right ventricle: Systolic function was normal. - Pulmonary arteries: Systolic pressure was within the normal   range.  Impressions:  - Rhythm is normal sinus.   Surgical History:  Past Surgical History:  Procedure Laterality Date  . CARDIAC CATHETERIZATION Right 11/06/2015   Procedure: Left Heart Cath and Coronary Angiography;  Surgeon: Minna Merritts, MD;  Location: [place] [facility];  Service: Cardiovascular;  Laterality: Right;  . COLONOSCOPY    . CYST REMOVAL HAND Left finger  . hemmorhoidectomy  1982  . INSERTION OF MESH N/A 11/16/2013   Procedure: INSERTION OF MESH;  Surgeon: Imogene Burn. Georgette Dover, MD;  Location: [place];  Service: General;  Laterality: N/A;  . UMBILICAL HERNIA REPAIR N/A 11/16/2013    Procedure: UMBILICAL HERNIA REPAIR;  Surgeon: Imogene Burn. Georgette Dover, MD;  Location: [place];  Service: General;  Laterality: N/A;     Home Meds: Prior to Admission medications   Medication Sig Start Date End Date Taking? Authorizing Provider  ALPRAZolam (XANAX) 0.25 MG tablet Take 1 tablet (0.25 mg total) by mouth 2 (two) times daily as needed. 11/06/14   Lucille Passy, MD  amiodarone (PACERONE) 400 MG tablet Take 1 tablet (400 mg total) by mouth 2 (two) times daily. 11/07/15   Bettey Costa, MD  aspirin 81 MG chewable tablet Chew 1 tablet (81 mg total) by mouth before cath procedure. 11/07/15   Bettey Costa, MD  Blood Glucose Monitoring Suppl ([place]) W/DEVICE KIT Use directed- may check blood sugars as needed and up to 3 times daily 04/17/14   Lucille Passy, MD  diltiazem (CARDIZEM) 30 MG tablet Take 1 tablet (30 mg total) by mouth 3 (three) times daily. 11/07/15   Bettey Costa, MD  escitalopram (LEXAPRO) 10 MG tablet Take 1 tablet (10 mg total) by mouth daily. 11/07/15   Bettey Costa, MD  glucose blood test strip Use as instructed 04/18/14   Lucille Passy, MD  hydrochlorothiazide (MICROZIDE) 12.5 MG capsule TAKE 1 CAPSULE BY MOUTH DAILY 03/20/15   Lucille Passy, MD  levothyroxine (SYNTHROID,  LEVOTHROID) 100 MCG tablet Take 1 tablet (100 mcg total) by mouth daily before breakfast. OFFICE VISIT WITH  LABS REQUIRED FOR ADDITIONAL REFILLS 10/07/15   Lucille Passy, MD  losartan-hydrochlorothiazide (HYZAAR) 100-25 MG tablet Take 1 tablet by mouth daily. 10/02/15 10/01/16  Historical Provider, MD  metFORMIN (GLUCOPHAGE) 500 MG tablet TAKE 1 TABLET BY MOUTH TWICE DAILY WITH A MEAL 03/20/15   Lucille Passy, MD  metoprolol tartrate (LOPRESSOR) 25 MG tablet Take 1 tablet (25 mg total) by mouth 2 (two) times daily. 11/07/15   Bettey Costa, MD  omeprazole (PRILOSEC) 20 MG capsule Take 1 capsule (20 mg total) by mouth daily. OFFICE VISIT REQUIRED FOR ADDITIONAL REFILLS 10/07/15   Lucille Passy, MD  piperacillin-tazobactam  (ZOSYN) 3.375 GM/50ML IVPB Inject 50 mLs (3.375 g total) into the vein every 8 (eight) hours. 11/06/15   Bettey Costa, MD  simvastatin (ZOCOR) 40 MG tablet Take 1 tablet (40 mg total) by mouth at bedtime. 12/10/14   Lucille Passy, MD  temazepam (RESTORIL) 15 MG capsule Take 1 capsule (15 mg total) by mouth at bedtime as needed for sleep. 11/07/15   Bettey Costa, MD    Allergies:  Allergies  Allergen Reactions  . Bupropion Hcl     REACTION: Severe constipation, insomnia  . Penicillins Other (See Comments)    REACTION: Vomiting Has patient had a PCN reaction causing immediate rash, facial/tongue/throat swelling, SOB or lightheadedness with hypotension: Yes Has patient had a PCN reaction causing severe rash involving mucus membranes or skin necrosis: No Has patient had a PCN reaction that required hospitalization No Has patient had a PCN reaction occurring within the last 10 years: No If all of the above answers are "NO", then may proceed with Cephalosporin use.    Social History   Social History  . Marital status: Married    Spouse name: N/A  . Number of children: 2  . Years of education: N/A   Occupational History  . Product Engineer-terminated, Engineer, manufacturing systems    Social History Main Topics  . Smoking status: Current Some Day Smoker    Packs/day: 0.13    Years: 30.00    Types: Cigarettes  . Smokeless tobacco: Never Used  . Alcohol use 8.4 oz/week    7 Cans of beer, 7 Shots of liquor per week  . Drug use: No  . Sexual activity: Not on file   Other Topics Concern  . Not on file   Social History Narrative  . No narrative on file     Family History  Problem Relation Age of Onset  . Cancer Mother     colon  . Hypertension Mother   . Diabetes Mother   . Depression Mother   . Colon cancer Mother   . Hyperthyroidism Father   . Hypertension Father   . Cancer Father     prostate  . Diabetes Maternal Grandmother   . Cancer Maternal Grandmother      lung, non smoker    Review of Systems: Review of Systems  Constitutional: Positive for malaise/fatigue. Negative for chills, diaphoresis, fever and weight loss.  HENT: Negative for congestion.   Eyes: Negative for discharge and redness.  Respiratory: Negative for cough, hemoptysis, sputum production, shortness of breath and wheezing.   Cardiovascular: Negative for chest pain, palpitations, orthopnea, claudication, leg swelling and PND.  Gastrointestinal: Positive for abdominal pain and nausea. Negative for blood in stool, heartburn, melena and vomiting.  RUQ pain  Genitourinary: Negative for hematuria.  Musculoskeletal: Negative for falls and myalgias.  Skin: Negative for rash.  Neurological: Positive for weakness. Negative for dizziness, tingling, tremors, sensory change, speech change, focal weakness and loss of consciousness.  Endo/Heme/Allergies: Does not bruise/bleed easily.  Psychiatric/Behavioral: Positive for depression. Negative for hallucinations, memory loss, substance abuse and suicidal ideas. The patient is nervous/anxious. The patient does not have insomnia.   All other systems reviewed and are negative.   Labs:   Lab Results  Component Value Date   WBC 12.3 (H) 11/07/2015   HGB 11.5 (L) 11/07/2015   HCT 31.8 (L) 11/07/2015   MCV 95.9 11/07/2015   PLT 144 (L) 11/07/2015    Recent Labs Lab 11/05/15 0450  11/07/15 0648  NA 125*  < > 131*  K 3.7  < > 3.7  CL 92*  < > 102  CO2 23  < > 22  BUN 35*  < > 16  CREATININE 2.10*  < > 1.24  CALCIUM 7.6*  < > 7.4*  PROT 5.6*  --   --   BILITOT 1.1  --   --   ALKPHOS 66  --   --   ALT 20  --   --   AST 17  --   --   GLUCOSE 124*  < > 150*  < > = values in this interval not displayed.  Recent Labs  11/04/15 1257 11/04/15 2355 11/05/15 0607  TROPONINI 0.04* 0.11* 0.04*   Lab Results  Component Value Date   CHOL 150 10/30/2014   HDL 42.30 10/30/2014   LDLCALC 88 10/30/2014   TRIG 99.0 10/30/2014   No  results found for: DDIMER  Radiology/Studies:  Ct Abdomen Pelvis Wo Contrast  Result Date: 11/04/2015 IMPRESSION: 1. Distended gallbladder with wall thickening and surrounding inflammation highly suspicious for acute cholecystitis. Correlate clinically. No calcified gallstones or biliary dilatation identified. 2. No other acute findings are seen.  The appendix appears normal. 3. Bibasilar atelectasis, hepatic steatosis and mild distal colonic diverticulosis. 4.  Aortic Atherosclerosis (ICD10-170.0) Electronically Signed   By: Richardean Sale M.D.   On: 11/04/2015 10:17   US Renal  Result Date: 11/04/2015 IMPRESSION: Negative. Electronically Signed   By: Staci Righter M.D.   On: 11/04/2015 17:31   Nm Myocar Multi W/spect W/wall Motion / Ef  Result Date: 11/06/2015 Pharmacological myocardial perfusion imaging study with moderate sized region of moderate intensity ischemia in the lateral wall Normal wall motion, EF estimated at 68% No EKG changes concerning for ischemia at peak stress or in recovery. High risk scan given lateral wall ischemia, given recent troponin elevation Signed, Esmond Plants, MD, Ph.D [facility]   US Abdomen Limited Ruq  Result Date: 11/04/2015 IMPRESSION: 1. Gallbladder wall thickening and sludge without gallstones, sonographic Murphy sign or biliary dilatation. 2. I reviewed the CT from earlier today, and the inflammatory changes on that study are in the right upper quadrant with the epicenter at the gallbladder. In conjunction with that study, findings still remain concerning for acalculous cholecystitis. The patient's appendix does lie in the right upper quadrant adjacent to the gallbladder, but appears normal. There is no apparent wall thickening of the duodenum or colon. The pancreas appears normal, and the patient does not have an elevated lipase level, although does have leukocytosis. Electronically Signed   By: Richardean Sale M.D.   On: 11/04/2015 11:35     EKG:  Interpreted by me showed: Afib with RVR, 169 bpm,  left axis, nonspecific st/t changes Telemetry: Interpreted by me showed: Afib in the evening of 8/16 per-cardiac cath converting to NSR overnight with heart rates in the 70's bpm  Weights: Filed Weights   11/04/15 0839 11/04/15 1230  Weight: 210 lb (95.3 kg) 207 lb 6.4 oz (94.1 kg)     Physical Exam: Blood pressure (!) 149/61, pulse 80, temperature 99.1 F (37.3 C), temperature source Oral, resp. rate 19, height _0  (1.702 m), weight 207 lb 6.4 oz (94.1 kg), SpO2 93 %. Body mass index is 32.48 kg/m. General: Well developed, well nourished, in no acute distress. Head: Normocephalic, atraumatic, sclera non-icteric, no xanthomas, nares are without discharge.  Neck: Negative for carotid bruits. JVD not elevated. Lungs: Clear bilaterally to auscultation without wheezes, rales, or rhonchi. Breathing is unlabored. Heart: RRR with S1 S2. No murmurs, rubs, or gallops appreciated. Abdomen: Soft, tender RUQ, non-distended with normoactive bowel sounds. No hepatomegaly. No rebound/guarding. No obvious abdominal masses. Msk:  Strength and tone appear normal for age. Extremities: No clubbing or cyanosis. No edema. Distal pedal pulses are 2+ and equal bilaterally. Neuro: Alert and oriented X 3. No focal deficit. No facial asymmetry. Moves all extremities spontaneously. Psych:  Responds to questions appropriately with a normal affect.    ASSESSMENT AND PLAN:  Active Problems:   Cholecystitis   PAF (paroxysmal atrial fibrillation) (HCC)   Right upper quadrant pain   NSTEMI (non-ST elevated myocardial infarction) ([place])   Coronary artery disease involving native coronary artery of native heart with angina pectoris (HCC)   Hypothyroidism   Diabetes ([place])   Essential hypertension   Acute renal failure (ARF) (HCC)   Aortic atherosclerosis (HCC)   Positive cardiac stress test   Anxiety state   NICOTINE ADDICTION   HLD (hyperlipidemia)   Moderate  major depression, single episode ([place])   1. NSTEMI/4-vessel CAD: -In the setting of cholecystitis  -Cardiac cath on 8/16 showed 4-vessel CAD as above -Currently chest pain free -Transfer to [facility] for evaluation by TCTS -Heparin gtt -ASA -Lipitor, Lopressor   2. PAF: -Currently in sinus rhythm with HR in the 70's bpm -Change amiodarone gtt to PO amiodarone -Lopressor for rate control -CHADS2VASc at least 3 (HTN, DM, vascular disease) -Heparin gtt, transition to DOAC at TCTS recommendation   3. Cholecystitis: -Surgery deferred at this time -Continue Zosyn  4. Depression/anxiety: -Psych on board -Started Lexapro   5. HTN: -Add Lopressor as above -Diltiazem, may be able to taper and use Lopressor  6. HLD: -Lipitor   7. DM: -Per IM  Signed, Christell Faith, PA-C [facility] Pager: [phone] 11/07/2015, 10:06 AM

## 2015-11-07 NOTE — Progress Notes (Signed)
[facility]  ROUNDING NOTE   Subjective:  Patient found to have 4 vessel coronary artery disease on cardiac catheterization. Therefore he is being transferred to [facility]. Renal function has improved and creatinine is currently down to 1.2. Still having right upper quadrant abdominal pain.   Objective:  Vital signs in last 24 hours:  Temp:  [97.6 F (36.4 C)-99.1 F (37.3 C)] 97.8 F (36.6 C) (08/17 1144) Pulse Rate:  [40-112] 72 (08/17 1144) Resp:  [16-28] 20 (08/17 1105) BP: (118-164)/(56-90) 151/60 (08/17 1144) SpO2:  [80 %-97 %] 94 % (08/17 1144) FiO2 (%):  [94 %] 94 % (08/16 1406)  Weight change:  Filed Weights   11/04/15 0839 11/04/15 1230  Weight: 95.3 kg (210 lb) 94.1 kg (207 lb 6.4 oz)    Intake/Output: I/O last 3 completed shifts: In: 5804.7 [P.O.:480; I.V.:5124.7; IV Piggyback:200] Out: 2050 [Urine:2050]   Intake/Output this shift:  Total I/O In: 78.2 [I.V.:78.2] Out: -   Physical Exam: General: No acute distress  Head: Normocephalic, atraumatic. Moist oral mucosal membranes  Eyes: Anicteric  Neck: Supple, trachea midline  Lungs:  Clear to auscultation, normal effort  Heart: S1S2 no rubs  Abdomen:  Soft, Right upper quadrant tenderness noted  Extremities: No peripheral edema.  Neurologic: Nonfocal, moving all four extremities  Skin: No lesions  Access:     Basic Metabolic Panel:  Recent Labs Lab 11/04/15 0841 11/04/15 0842 11/05/15 0450 11/06/15 0344 11/07/15 0648  NA 121*  --  125* 127* 131*  K 4.8  --  3.7 4.0 3.7  CL 87*  --  92* 97* 102  CO2 22  --  _0 GLUCOSE 209*  --  124* 177* 150*  BUN 31*  --  35* 24* 16  CREATININE 2.40*  --  2.10* 1.75* 1.24  CALCIUM 8.7*  --  7.6* 8.0* 7.4*  MG  --  1.9  --   --   --     Liver Function Tests:  Recent Labs Lab 11/04/15 0841 11/05/15 0450  AST 28 17  ALT 22 20  ALKPHOS 55 66  BILITOT 1.8* 1.1  PROT 6.9 5.6*  ALBUMIN 3.5 2.7*    Recent Labs Lab  11/04/15 0841  LIPASE 18   No results for input(s): AMMONIA in the last 168 hours.  CBC:  Recent Labs Lab 11/04/15 0841 11/05/15 0450 11/06/15 0344 11/07/15 0648  WBC 24.2* 13.6* 12.7* 12.3*  NEUTROABS 22.6*  --   --   --   HGB 14.6 12.1* 12.5* 11.5*  HCT 40.9 34.1* 36.0* 31.8*  MCV 96.2 95.8 97.0 95.9  PLT 176 128* 144* 144*    Cardiac Enzymes:  Recent Labs Lab 11/04/15 0841 11/04/15 1257 11/04/15 2355 11/05/15 0607  TROPONINI 0.27* 0.04* 0.11* 0.04*    BNP: Invalid input(s): POCBNP  CBG:  Recent Labs Lab 11/06/15 1113 11/06/15 1739 11/06/15 2053 11/07/15 0722 11/07/15 1106  GLUCAP 161* 106* 120* 142* 153*    Microbiology: Results for orders placed or performed in visit on 02/10/11  Fecal occult blood, imunochemical     Status: None   Collection Time: 02/10/11 11:21 AM  Result Value Ref Range Status   Fecal Occult Bld Negative Negative Final    Coagulation Studies: No results for input(s): LABPROT, INR in the last 72 hours.  Urinalysis:  Recent Labs  11/04/15 2123  COLORURINE YELLOW*  LABSPEC 1.013  PHURINE 6.0  GLUCOSEU >500*  HGBUR NEGATIVE  BILIRUBINUR NEGATIVE  KETONESUR NEGATIVE  PROTEINUR NEGATIVE  NITRITE NEGATIVE  LEUKOCYTESUR NEGATIVE      Imaging: Nm Myocar Multi W/spect W/wall Motion / Ef  Result Date: 11/06/2015 Pharmacological myocardial perfusion imaging study with moderate sized region of moderate intensity ischemia in the lateral wall Normal wall motion, EF estimated at 68% No EKG changes concerning for ischemia at peak stress or in recovery. High risk scan given lateral wall ischemia, given recent troponin elevation Signed, Esmond Plants, MD, Ph.D [facility]     Medications:   . sodium chloride 150 mL/hr at 11/06/15 1804  . heparin 1,800 Units/hr (11/07/15 1134)   . amiodarone  400 mg Oral BID  . atorvastatin  40 mg Oral q1800  . diltiazem  30 mg Oral Q6H  . escitalopram  10 mg Oral Daily  . folic acid  1  mg Oral Daily  . insulin aspart  0-5 Units Subcutaneous QHS  . insulin aspart  0-9 Units Subcutaneous TID WC  . levothyroxine  100 mcg Oral QAC breakfast  . LORazepam  0-4 mg Oral Q12H  . metoprolol tartrate  25 mg Oral BID  . multivitamin with minerals  1 tablet Oral Daily  . pantoprazole  40 mg Oral Daily  . piperacillin-tazobactam (ZOSYN)  IV  3.375 g Intravenous Q8H  . potassium chloride  20 mEq Oral TID  . sodium chloride flush  3 mL Intravenous Q12H  . thiamine  100 mg Oral Daily   Or  . thiamine  100 mg Intravenous Daily   acetaminophen **OR** acetaminophen, HYDROcodone-homatropine, ketorolac, LORazepam, LORazepam **OR** LORazepam, ondansetron **OR** ondansetron (ZOFRAN) IV, senna-docusate, temazepam, traMADol  Assessment/ Plan:  65 y.o. male with a PMHx of GERD, hyperlipidemia, hypertension, hypothyroidism, diabetes mellitus type 2, who was admitted to [facility] on 11/04/2015 for evaluation of abdominal pain. Patient reports that the symptoms began this past Saturday.  1.  Acute renal failure/chronic kidney disease stage II baseline creatinine 1.1 EGFR 68.  Acute renal failure secondary to sepsis from cholecystitis, use of BC powder, poor by mouth intake and losartan/hydrochlorothiazide.  CT scan of the abdomen and pelvis revealed bilateral renal cortical thinning but no hydronephrosis.   -  Creatinine continues to trend down nicely. Currently creatinine is 1.2. This is close to his baseline. He is certainly at risk for another episode of acute renal failure if he has CABG. Recommend continued monitoring of renal function.  2.  Hyponatremia.   serum sodium now up to 131. Continue IV fluid hydration with 0.9 normal saline.  3.  Acute cholecystitis.   surgery delayed secondary to the finding of 4 vessel coronary artery disease. Patient will be transferred to [facility].  4. Four-vessel coronary artery disease. This was found on cardiac catheterization. As above he's being  transferred to [facility].   LOS: 3 Nehemyah Foushee 8/17/201711:59 AM

## 2015-11-07 NOTE — Progress Notes (Signed)
A&O. Up with assist. Amiodorone, heparin and NS all infusing. VSS. Went from afib to SR on tele. No complaints through the night. For CABG at [facility] cone today.

## 2015-11-07 NOTE — Progress Notes (Signed)
ANTICOAGULATION CONSULT NOTE - Initial Consult  Pharmacy Consult for Heparin drip Indication: chest pain/ACS and atrial fibrillation  Allergies  Allergen Reactions  . Bupropion Hcl     REACTION: Severe constipation, insomnia  . Penicillins Other (See Comments)    REACTION: Vomiting Has patient had a PCN reaction causing immediate rash, facial/tongue/throat swelling, SOB or lightheadedness with hypotension: Yes Has patient had a PCN reaction causing severe rash involving mucus membranes or skin necrosis: No Has patient had a PCN reaction that required hospitalization No Has patient had a PCN reaction occurring within the last 10 years: No If all of the above answers are "NO", then may proceed with Cephalosporin use.    Patient Measurements: Height: _0  (170.2 cm) Weight: 207 lb 6.4 oz (94.1 kg) IBW/kg (Calculated) : 66.1 Heparin Dosing Weight: 86.4 kg  Vital Signs: Temp: 97.8 F (36.6 C) (08/16 2002) Temp Source: Oral (08/16 2002) BP: 138/58 (08/16 2341) Pulse Rate: 105 (08/16 2341)  Labs:  Recent Labs  11/04/15 0841 11/04/15 1257  11/04/15 2355 11/05/15 0450 11/05/15 0607 11/05/15 1402 11/05/15 2133 11/06/15 0344  HGB 14.6  --   --   --  12.1*  --   --   --  12.5*  HCT 40.9  --   --   --  34.1*  --   --   --  36.0*  PLT 176  --   --   --  128*  --   --   --  144*  APTT 27  --   --   --   --   --   --   --   --   LABPROT 14.7  --   --   --   --   --   --   --   --   INR 1.14  --   --   --   --   --   --   --   --   HEPARINUNFRC  --   --   < >  --  0.29*  --  0.19* 0.31 0.42  CREATININE 2.40*  --   --   --  2.10*  --   --   --  1.75*  TROPONINI 0.27* 0.04*  --  0.11*  --  0.04*  --   --   --   < > = values in this interval not displayed.  Estimated Creatinine Clearance: 47.2 mL/min (by C-G formula based on SCr of 1.75 mg/dL).    Assessment: 65 yo male with elevated troponin and AFib with RVR. No anticoagulants noted in prior to admission med list. Spoke with  ED MD, would NOT like a heparin bolus at this time as pt may need surgery; would just like the maintenance drip started.   Hgb 14.6, Plt 176 APTT and INR pending  Goal of Therapy:  Heparin level 0.3-0.7 units/ml Monitor platelets by anticoagulation protocol: Yes   Plan:  Heparin resumed post cardiac cath. Resume at 1550 units/hr (which produced two therapeutic levels). No bolus per MD. Will check heparin level in 6 hours.  Jeshua Ransford A. [place], [place].D., BCPS Clinical Pharmacist 11/07/2015 [phone]

## 2015-11-07 NOTE — Progress Notes (Signed)
Patient: Brian Casey / Admit Date: 11/04/2015 / Date of Encounter: 11/07/2015, 8:07 AM   Subjective: Cardiac cath on 11/06/15 showed severe 4-vessel CAD. Recommendation has been for patient to be transferred to [facility] for evaluation by TCTS for possible cardiac bypass surgery. In the evening of 8/16 he went back into Afib and was placed on an amiodarone infusion. He converted to sinus rhythm with heart rates in the 70's overnight and has remained there since. Has been seen by psychiatry for depression given his gallbladder issues as well as his newly found CAD with work up as above. Restarted on heparin gtt overnight. No chest pain.   Review of Systems: Review of Systems  Constitutional: Positive for malaise/fatigue. Negative for chills, diaphoresis, fever and weight loss.  HENT: Negative for congestion.   Eyes: Negative for discharge and redness.  Respiratory: Negative for cough, hemoptysis, sputum production, shortness of breath and wheezing.   Cardiovascular: Negative for chest pain, palpitations, orthopnea, claudication, leg swelling and PND.  Gastrointestinal: Positive for abdominal pain. Negative for blood in stool, heartburn, melena, nausea and vomiting.       RUQ pain  Genitourinary: Negative for hematuria.  Musculoskeletal: Negative for falls and myalgias.  Skin: Negative for rash.  Neurological: Positive for weakness. Negative for dizziness, tingling, tremors, sensory change, speech change, focal weakness and loss of consciousness.  Endo/Heme/Allergies: Does not bruise/bleed easily.  Psychiatric/Behavioral: Positive for depression. Negative for substance abuse. The patient is nervous/anxious.   All other systems reviewed and are negative.   Objective: Telemetry: Afib in the evening of 8/16 per-cardiac cath converting to NSR overnight with heart rates in the 70's bpm Physical Exam: Blood pressure (!) 149/61, pulse 80, temperature 99.1 F (37.3 C), temperature source Oral,  resp. rate 19, height _0  (1.702 m), weight 207 lb 6.4 oz (94.1 kg), SpO2 93 %. Body mass index is 32.48 kg/m. General: Well developed, well nourished, in no acute distress. Head: Normocephalic, atraumatic, sclera non-icteric, no xanthomas, nares are without discharge. Neck: Negative for carotid bruits. JVP not elevated. Lungs: Clear bilaterally to auscultation without wheezes, rales, or rhonchi. Breathing is unlabored. Heart: RRR S1 S2 without murmurs, rubs, or gallops.  Abdomen: Soft, tender RUQ, non-distended with normoactive bowel sounds. No rebound/guarding. Extremities: No clubbing or cyanosis. No edema. Distal pedal pulses are 2+ and equal bilaterally. Neuro: Alert and oriented X 3. Moves all extremities spontaneously. Psych:  Responds to questions appropriately with a normal affect.   Intake/Output Summary (Last 24 hours) at 11/07/15 0807 Last data filed at 11/07/15 0619  Gross per 24 hour  Intake          3808.71 ml  Output             1300 ml  Net          2508.71 ml    Inpatient Medications:  . atorvastatin  40 mg Oral q1800  . diltiazem  30 mg Oral Q6H  . escitalopram  10 mg Oral Daily  . folic acid  1 mg Oral Daily  . insulin aspart  0-5 Units Subcutaneous QHS  . insulin aspart  0-9 Units Subcutaneous TID WC  . levothyroxine  100 mcg Oral QAC breakfast  . LORazepam  0-4 mg Oral Q12H  . multivitamin with minerals  1 tablet Oral Daily  . pantoprazole  40 mg Oral Daily  . piperacillin-tazobactam (ZOSYN)  IV  3.375 g Intravenous Q8H  . potassium chloride  20 mEq Oral TID  .  sodium chloride flush  3 mL Intravenous Q12H  . thiamine  100 mg Oral Daily   Or  . thiamine  100 mg Intravenous Daily   Infusions:  . sodium chloride 150 mL/hr at 11/06/15 1804  . amiodarone 30 mg/hr (11/07/15 0030)  . heparin 1,550 Units/hr (11/07/15 0149)    Labs:  Recent Labs  11/04/15 0842 11/05/15 0450 11/06/15 0344  NA  --  125* 127*  K  --  3.7 4.0  CL  --  92* 97*  CO2  --   23 24  GLUCOSE  --  124* 177*  BUN  --  35* 24*  CREATININE  --  2.10* 1.75*  CALCIUM  --  7.6* 8.0*  MG 1.9  --   --     Recent Labs  11/04/15 0841 11/05/15 0450  AST 28 17  ALT 22 20  ALKPHOS 55 66  BILITOT 1.8* 1.1  PROT 6.9 5.6*  ALBUMIN 3.5 2.7*    Recent Labs  11/04/15 0841  11/06/15 0344 11/07/15 0648  WBC 24.2*  < > 12.7* 12.3*  NEUTROABS 22.6*  --   --   --   HGB 14.6  < > 12.5* 11.5*  HCT 40.9  < > 36.0* 31.8*  MCV 96.2  < > 97.0 95.9  PLT 176  < > 144* 144*  < > = values in this interval not displayed.  Recent Labs  11/04/15 0841 11/04/15 1257 11/04/15 2355 11/05/15 0607  TROPONINI 0.27* 0.04* 0.11* 0.04*   Invalid input(s): POCBNP  Recent Labs  11/04/15 1257  HGBA1C 6.4*     Weights: Filed Weights   11/04/15 0839 11/04/15 1230  Weight: 210 lb (95.3 kg) 207 lb 6.4 oz (94.1 kg)     Radiology/Studies:  Ct Abdomen Pelvis Wo Contrast  Result Date: 11/04/2015 CLINICAL DATA:  Right lower quadrant pain extending into the right upper and central abdomen. Symptoms for 2 days. History of hernia repair. EXAM: CT ABDOMEN AND PELVIS WITHOUT CONTRAST TECHNIQUE: Multidetector CT imaging of the abdomen and pelvis was performed following the standard protocol without IV contrast. COMPARISON:  None. FINDINGS: Lower chest: Mild dependent airspace opacities at both lung bases, probably atelectasis. No significant pleural or pericardial effusion. Prominent coronary artery atherosclerosis noted. There is a small hiatal hernia. Hepatobiliary: The hepatic density is diffusely decreased, consistent with steatosis. No focal hepatic abnormalities are identified on noncontrast imaging. The gallbladder is distended with wall thickening and prominent surrounding inflammatory changes. No calcified gallstones are seen. There is no significant biliary dilatation. Pancreas: Unremarkable. No pancreatic ductal dilatation or surrounding inflammatory changes. Spleen: Normal in size  without focal abnormality. Adrenals/Urinary Tract: Both adrenal glands appear normal. Both kidneys demonstrate mild cortical thinning and symmetric perinephric soft tissue stranding. 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 No evidence of renal mass, hydronephrosis or urinary tract calculus. The bladder appears unremarkable. Stomach/Bowel: No evidence bowel wall thickening or distention. The right upper quadrant inflammatory changes do not appear to arise from the duodenum. The appendix appears normal. There is mild distal colonic diverticulosis. Vascular/Lymphatic: There are no enlarged abdominal or pelvic lymph nodes. Aortic and branch vessel atherosclerosis noted. Vascular assessment limited without contrast. Reproductive: There is a focal parenchymal calcification within the right aspect of the prostate gland which is not significantly enlarged. The seminal vesicles appear normal. Other: No evidence of abdominal wall mass or hernia. Musculoskeletal: No acute or significant osseous findings. IMPRESSION: 1. Distended gallbladder with  wall thickening and surrounding inflammation highly suspicious for acute cholecystitis. Correlate clinically. No calcified gallstones or biliary dilatation identified. 2. No other acute findings are seen.  The appendix appears normal. 3. Bibasilar atelectasis, hepatic steatosis and mild distal colonic diverticulosis. 4.  Aortic Atherosclerosis (ICD10-170.0) Electronically Signed   By: Richardean Sale M.D.   On: 11/04/2015 10:17   US Renal  Result Date: 11/04/2015 CLINICAL DATA:  Acute renal failure. EXAM: RENAL / URINARY TRACT ULTRASOUND COMPLETE COMPARISON:  None. FINDINGS: Right Kidney: Length: 12.4 cm. Echogenicity within normal limits. No mass or hydronephrosis visualized. Left Kidney: Length: 11.1 cm. Echogenicity within normal limits. No mass or hydronephrosis visualized. Bladder: Appears normal for degree of bladder distention. IMPRESSION:  Negative. Electronically Signed   By: Staci Righter M.D.   On: 11/04/2015 17:31   Nm Myocar Multi W/spect W/wall Motion / Ef  Result Date: 11/06/2015 Pharmacological myocardial perfusion imaging study with moderate sized region of moderate intensity ischemia in the lateral wall Normal wall motion, EF estimated at 68% No EKG changes concerning for ischemia at peak stress or in recovery. High risk scan given lateral wall ischemia, given recent troponin elevation Signed, Esmond Plants, MD, Ph.D [facility]   US Abdomen Limited Ruq  Result Date: 11/04/2015 CLINICAL DATA:  Right-sided abdominal pain for 2 days.  Abnormal CT. EXAM: US ABDOMEN LIMITED - RIGHT UPPER QUADRANT COMPARISON:  CT 11/04/2015. FINDINGS: Gallbladder: Gallbladder wall thickening to 7 mm is noted at the gallbladder fundus. Gallbladder sludge is present. No discrete gallstones are seen. Sonographic Percell Miller sign is absent according to the sonographer. Common bile duct: Diameter: 5 mm. Liver: Increased echogenicity corresponding with steatosis on CT. No focal hepatic abnormalities identified. IMPRESSION: 1. Gallbladder wall thickening and sludge without gallstones, sonographic Murphy sign or biliary dilatation. 2. I reviewed the CT from earlier today, and the inflammatory changes on that study are in the right upper quadrant with the epicenter at the gallbladder. In conjunction with that study, findings still remain concerning for acalculous cholecystitis. The patient's appendix does lie in the right upper quadrant adjacent to the gallbladder, but appears normal. There is no apparent wall thickening of the duodenum or colon. The pancreas appears normal, and the patient does not have an elevated lipase level, although does have leukocytosis. Electronically Signed   By: Richardean Sale M.D.   On: 11/04/2015 11:35    Cardiac cath 11/06/15: Coronary angiography:  Coronary dominance: Right  Left mainstem: Large vessel that bifurcates into the LAD  and left circumflex, 40% distal left main disease  Left anterior descending (LAD): Large vessel that extends to the apical region, diagonal branch 2 of moderate size, 70% proximal LAD disease prior to a septal branch  Left circumflex (LCx): Large vessel with OM branch 2, critical 90% ostial disease  Ramus/high OM: Moderate size vessel, Critical ostial disease estimated at 90%  Right coronary artery (RCA): Right dominant vessel with PL and PDA, severe/critical mid to distal disease estimated at 90-95%, also with suspected 60-70% proximal disease of a tortuous bend, at least 40-50% distal disease prior to the bifurcation  Left ventriculography: LV gram not performed given the contrast load  Total contrast 210 cc secondary to tremendous challenge engaging the ostium of the RCA. Dr Ellyn Hack was contacted and assisted in the case, finally obtaining adequate visualization of the RCA. Takeoff was very high anterior  Final Conclusions:  Four-vessel disease Moderate to severe proximal LAD disease, Critical ostial left circumflex and ramus disease Critical mid to distal RCA  disease  Recommendations:  Discussed case with family and with Dr. Ellyn Hack, Recommendation made for CABG patient has indicated they would like to go to [place]   Assessment and Plan  Principal Problem:   Cholecystitis Active Problems:   PAF (paroxysmal atrial fibrillation) (HCC)   Right upper quadrant pain   NSTEMI (non-ST elevated myocardial infarction) ([place])   Coronary artery disease involving native coronary artery of native heart with angina pectoris (HCC)   Hypothyroidism   Diabetes ([place])   Essential hypertension   Acute renal failure (ARF) (HCC)   Aortic atherosclerosis ([place])   Positive cardiac stress test   Anxiety state   NICOTINE ADDICTION   HLD (hyperlipidemia)   Pre-op evaluation   Moderate major depression, single episode (HCC)   Generalized anxiety disorder    1. NSTEMI/4-vessel CAD: -In  the setting of cholecystitis  -Cardiac cath on 8/16 showed 4-vessel CAD as above -Currently chest pain free -Transfer to [facility] for evaluation by TCTS -Heparin gtt -ASA -Lipitor, Lopressor   2. PAF: -Currently in sinus rhythm with HR in the 70's bpm -Change amiodarone gtt to PO amiodarone -Lopressor for rate control -CHADS2VASc at least 3 (HTN, DM, vascular disease) -Heparin gtt, transition to DOAC at TCTS recommendation   3. Cholecystitis: -Surgery deferred at this time -ABX per IM  4. Depression/anxiety: -Psych on board -Started Lexapro   5. HTN: -Add Lopressor as above -Diltiazem, may be able to taper and use Lopressor  6. HLD: -Lipitor   7. DM: -Per IM  Signed, Christell Faith, PA-C [facility] Pager: [phone] 11/07/2015, 8:07 AM

## 2015-11-07 NOTE — Progress Notes (Signed)
MEDICATION RELATED CONSULT NOTE - INITIAL   Pharmacy Consult for amiodarone DDI Indication: atrial fibrillation  Allergies  Allergen Reactions  . Bupropion Hcl     REACTION: Severe constipation, insomnia  . Penicillins Other (See Comments)    REACTION: Vomiting Has patient had a PCN reaction causing immediate rash, facial/tongue/throat swelling, SOB or lightheadedness with hypotension: Yes Has patient had a PCN reaction causing severe rash involving mucus membranes or skin necrosis: No Has patient had a PCN reaction that required hospitalization No Has patient had a PCN reaction occurring within the last 10 years: No If all of the above answers are "NO", then may proceed with Cephalosporin use.    Patient Measurements: Height: _0  (170.2 cm) Weight: 207 lb 6.4 oz (94.1 kg) IBW/kg (Calculated) : 66.1   Vital Signs: Temp: 97.8 F (36.6 C) (08/17 0832) Temp Source: Oral (08/17 0832) BP: 142/57 (08/17 0832) Pulse Rate: 70 (08/17 0832) Intake/Output from previous day: 08/16 0701 - 08/17 0700 In: 3808.7 [I.V.:3708.7; IV Piggyback:100] Out: 1300 [Urine:1300] Intake/Output from this shift: No intake/output data recorded.  Labs:  Recent Labs  11/05/15 0450 11/06/15 0344 11/07/15 0648  WBC 13.6* 12.7* 12.3*  HGB 12.1* 12.5* 11.5*  HCT 34.1* 36.0* 31.8*  PLT 128* 144* 144*  CREATININE 2.10* 1.75* 1.24  ALBUMIN 2.7*  --   --   PROT 5.6*  --   --   AST 17  --   --   ALT 20  --   --   ALKPHOS 66  --   --   BILITOT 1.1  --   --    Estimated Creatinine Clearance: 66.7 mL/min (by C-G formula based on SCr of 1.24 mg/dL).   Microbiology: No results found for this or any previous visit (from the past 720 hour(s)).  Medical History: Past Medical History:  Diagnosis Date  . GERD (gastroesophageal reflux disease)   . High cholesterol   . Hypertension 10/17/2013  . Hypothyroidism   . Thyroid disease   . Type II diabetes mellitus (HCC)     Medications:  Scheduled:   . amiodarone  400 mg Oral BID  . atorvastatin  40 mg Oral q1800  . diltiazem  30 mg Oral Q6H  . escitalopram  10 mg Oral Daily  . folic acid  1 mg Oral Daily  . insulin aspart  0-5 Units Subcutaneous QHS  . insulin aspart  0-9 Units Subcutaneous TID WC  . levothyroxine  100 mcg Oral QAC breakfast  . LORazepam  0-4 mg Oral Q12H  . metoprolol tartrate  25 mg Oral BID  . multivitamin with minerals  1 tablet Oral Daily  . pantoprazole  40 mg Oral Daily  . piperacillin-tazobactam (ZOSYN)  IV  3.375 g Intravenous Q8H  . potassium chloride  20 mEq Oral TID  . sodium chloride flush  3 mL Intravenous Q12H  . thiamine  100 mg Oral Daily   Or  . thiamine  100 mg Intravenous Daily    Assessment: 65 yo male on amiodarone infusion for atrial fibrillation. Patient has two major and one minor interaction with amiodarone. Amiodarone interacts with Zofran PRN and diltiazem as major interactions. Amiodarone interacts with atorvastatin as a minor interaction.   Plan:  No dose adjustments or medication changes needed at this time.   Monitor as follows: 1. Amiodarone and Zofran --> QTc 2. Amiodarone and diltiazem --> HR and telemetry 3. Amiodarone and atorvastatin --> myopathy 4. Amiodarone and escitalopram--> QTc prolongation escitalopram is a  moderate risk agent. Spoke with Dr. Weber Cooks about the possibility of using a different agent. States that he will take a look at the EKG but that this is the best agent for patient.  Arli Bree D Alexas Basulto 11/07/2015,9:05 AM

## 2015-11-07 NOTE — Progress Notes (Signed)
ANTICOAGULATION CONSULT NOTE - Initial Consult  Pharmacy Consult for Heparin drip Indication: chest pain/ACS and atrial fibrillation  Allergies  Allergen Reactions  . Bupropion Hcl     REACTION: Severe constipation, insomnia  . Penicillins Other (See Comments)    REACTION: Vomiting Has patient had a PCN reaction causing immediate rash, facial/tongue/throat swelling, SOB or lightheadedness with hypotension: Yes Has patient had a PCN reaction causing severe rash involving mucus membranes or skin necrosis: No Has patient had a PCN reaction that required hospitalization No Has patient had a PCN reaction occurring within the last 10 years: No If all of the above answers are "NO", then may proceed with Cephalosporin use.    Patient Measurements: Height: 5' 7" (170.2 cm) Weight: 207 lb 6.4 oz (94.1 kg) IBW/kg (Calculated) : 66.1 Heparin Dosing Weight: 86.4 kg  Vital Signs: Temp: 97.8 F (36.6 C) (08/17 0832) Temp Source: Oral (08/17 0832) BP: 142/57 (08/17 0832) Pulse Rate: 70 (08/17 0832)  Labs:  Recent Labs  11/04/15 1257  11/04/15 2355  11/05/15 0450 11/05/15 0607  11/05/15 2133 11/06/15 0344 11/07/15 0648  HGB  --   --   --   < > 12.1*  --   --   --  12.5* 11.5*  HCT  --   --   --   --  34.1*  --   --   --  36.0* 31.8*  PLT  --   --   --   --  128*  --   --   --  144* 144*  HEPARINUNFRC  --   < >  --   --  0.29*  --   < > 0.31 0.42 <0.10*  CREATININE  --   --   --   --  2.10*  --   --   --  1.75* 1.24  TROPONINI 0.04*  --  0.11*  --   --  0.04*  --   --   --   --   < > = values in this interval not displayed.  Estimated Creatinine Clearance: 66.7 mL/min (by C-G formula based on SCr of 1.24 mg/dL).    Assessment: 65 yo male with elevated troponin and AFib with RVR. No anticoagulants noted in prior to admission med list.   Goal of Therapy:  Heparin level 0.3-0.7 units/ml Monitor platelets by anticoagulation protocol: Yes   Plan:  Heparin level this AM low at  <0.10. Confirmed with RN that drip was not turned off. Spoke with PA Dunn who states continue with no bolus, just increase rate. Will increase rate to 1800 units/hr and check heparin level in 6 hours.  Ramond Dial, Pharm.D Clinical Pharmacist  11/07/2015 10:31 AM

## 2015-11-07 NOTE — Progress Notes (Signed)
Inpatient Diabetes Program Recommendations  AACE/ADA: New Consensus Statement on Inpatient Glycemic Control (2015)  Target Ranges:  Prepandial:   less than 140 mg/dL      Peak postprandial:   less than 180 mg/dL (1-2 hours)      Critically ill patients:  140 - 180 mg/dL   Lab Results  Component Value Date   GLUCAP 142 (H) 11/07/2015   HGBA1C 6.4 (H) 11/04/2015    Review of Glycemic Control  Results for [name], [name] (MRN [id number]) as of 11/07/2015 10:18  Ref. Range 11/06/2015 07:22 11/06/2015 11:13 11/06/2015 17:39 11/06/2015 20:53 11/07/2015 07:22  Glucose-Capillary Latest Ref Range: 65 - 99 mg/dL 170 (H) 161 (H) 106 (H) 120 (H) 142 (H)    Review of Glycemic Control  Diabetes history: DM2 Outpatient Diabetes medications: Metformin 500 mg BID Current orders for Inpatient glycemic control: Novolog 0-9 units TID with meals, Novolog 0-5 units QHS  Inpatient Diabetes Program Recommendations:  Agree with current orders for blood sugar management  Gentry Fitz, RN, [place], [place], CDE Diabetes Coordinator Inpatient Diabetes Program  [phone] (Team Pager) [phone] ([place]) 11/07/2015 10:20 AM

## 2015-11-07 NOTE — Progress Notes (Deleted)
ANTICOAGULATION CONSULT NOTE - FOLLOW UP    HL = 0.27 (goal 0.3 - 0.7 units/mL) Heparin dosing weight = 86 kg   Assessment: 75 YOM presented to [facility] with AFib and elevated troponin and started on IV heaprin.  He is s/p cath and then transferred to [facility] for TCTS evaluation.  Heparin level is slightly below goal post rate adjustment.  No bleeding reported.  Confirmed with RN that heparin was infusing upon transfer.  No bleeding reported.   Plan: - Increase heparin gtt to 1950 units/hr - F/U AM labs    Kamran Coker D. Mina Marble, PharmD, BCPS 11/07/2015, 5:27 PM

## 2015-11-07 NOTE — Progress Notes (Signed)
Patient is being transfer to [place] , in a stable condition , report given to floor nurse and care link , pick up bu EMS

## 2015-11-07 NOTE — Progress Notes (Addendum)
ANTICOAGULATION CONSULT NOTE - FOLLOW UP    HL = 0.27 (goal 0.3 - 0.7 units/mL) Heparin dosing weight = 86 kg   Assessment: 76 YOM presented to [facility] with AFib and elevated troponin and started on IV heparin.  He is s/p cath and then transferred to [facility] for TCTS evaluation.  Heparin level is slightly below goal post rate adjustment.  No bleeding reported.  Confirmed with RN that heparin was infusing upon transfer.    Plan: - Increase heparin gtt to 1950 units/hr - F/U AM labs    Tamaira Ciriello D. Mina Marble, PharmD, BCPS 11/07/2015, 5:40 PM

## 2015-11-07 NOTE — Care Management (Signed)
Transferring to [facility] for CABG. Medical Necessity completed and primary nurse updated.

## 2015-11-08 ENCOUNTER — Encounter (HOSPITAL_COMMUNITY): Payer: Managed Care, Other (non HMO)

## 2015-11-08 ENCOUNTER — Inpatient Hospital Stay (HOSPITAL_COMMUNITY): Payer: Managed Care, Other (non HMO)

## 2015-11-08 ENCOUNTER — Encounter (HOSPITAL_COMMUNITY): Payer: Self-pay | Admitting: Physician Assistant

## 2015-11-08 DIAGNOSIS — Z01810 Encounter for preprocedural cardiovascular examination: Secondary | ICD-10-CM

## 2015-11-08 DIAGNOSIS — I25119 Atherosclerotic heart disease of native coronary artery with unspecified angina pectoris: Secondary | ICD-10-CM

## 2015-11-08 DIAGNOSIS — I4891 Unspecified atrial fibrillation: Secondary | ICD-10-CM

## 2015-11-08 DIAGNOSIS — I25110 Atherosclerotic heart disease of native coronary artery with unstable angina pectoris: Secondary | ICD-10-CM

## 2015-11-08 DIAGNOSIS — I214 Non-ST elevation (NSTEMI) myocardial infarction: Secondary | ICD-10-CM

## 2015-11-08 HISTORY — PX: IR GENERIC HISTORICAL: IMG1180011

## 2015-11-08 LAB — CBC WITH DIFFERENTIAL/PLATELET
BASOS ABS: 0 10*3/uL (ref 0.0–0.1)
BASOS PCT: 0 %
EOS ABS: 0.1 10*3/uL (ref 0.0–0.7)
Eosinophils Relative: 1 %
HEMATOCRIT: 30.6 % — AB (ref 39.0–52.0)
HEMOGLOBIN: 10.6 g/dL — AB (ref 13.0–17.0)
Lymphocytes Relative: 4 %
Lymphs Abs: 0.5 10*3/uL — ABNORMAL LOW (ref 0.7–4.0)
MCH: 32.6 pg (ref 26.0–34.0)
MCHC: 34.6 g/dL (ref 30.0–36.0)
MCV: 94.2 fL (ref 78.0–100.0)
MONO ABS: 1 10*3/uL (ref 0.1–1.0)
Monocytes Relative: 7 %
NEUTROS ABS: 11.4 10*3/uL — AB (ref 1.7–7.7)
NEUTROS PCT: 88 %
Platelets: 176 10*3/uL (ref 150–400)
RBC: 3.25 MIL/uL — ABNORMAL LOW (ref 4.22–5.81)
RDW: 13 % (ref 11.5–15.5)
WBC: 13 10*3/uL — ABNORMAL HIGH (ref 4.0–10.5)

## 2015-11-08 LAB — BASIC METABOLIC PANEL
Anion gap: 9 (ref 5–15)
BUN: 11 mg/dL (ref 6–20)
CO2: 21 mmol/L — ABNORMAL LOW (ref 22–32)
CREATININE: 1.29 mg/dL — AB (ref 0.61–1.24)
Calcium: 7.9 mg/dL — ABNORMAL LOW (ref 8.9–10.3)
Chloride: 99 mmol/L — ABNORMAL LOW (ref 101–111)
GFR calc Af Amer: >60 mL/min (ref >60)
GFR, EST NON AFRICAN AMERICAN: 57 mL/min — AB (ref >60)
GLUCOSE: 110 mg/dL — AB (ref 65–99)
Potassium: 3.6 mmol/L (ref 3.5–5.1)
SODIUM: 129 mmol/L — AB (ref 135–145)

## 2015-11-08 LAB — URINALYSIS, ROUTINE W REFLEX MICROSCOPIC
Bilirubin Urine: NEGATIVE
Glucose, UA: 100 mg/dL — AB
Hgb urine dipstick: NEGATIVE
Ketones, ur: 15 mg/dL — AB
Leukocytes, UA: NEGATIVE
Nitrite: NEGATIVE
Protein, ur: NEGATIVE mg/dL
Specific Gravity, Urine: 1.019 (ref 1.005–1.030)
pH: 6.5 (ref 5.0–8.0)

## 2015-11-08 LAB — VAS US DOPPLER PRE CABG
LEFT ECA DIAS: -25 cm/s
LEFT VERTEBRAL DIAS: 14 cm/s
Left CCA dist dias: -5 cm/s
Left CCA dist sys: -73 cm/s
Left CCA prox dias: -198 cm/s
Left CCA prox sys: -497 cm/s
Left ICA dist dias: -83 cm/s
Left ICA dist sys: -246 cm/s
Left ICA prox dias: -181 cm/s
Left ICA prox sys: -502 cm/s
RIGHT ECA DIAS: -1 cm/s
RIGHT VERTEBRAL DIAS: 26 cm/s
Right CCA prox dias: -22 cm/s
Right CCA prox sys: -71 cm/s
Right cca dist sys: -81 cm/s

## 2015-11-08 LAB — COMPREHENSIVE METABOLIC PANEL
ALK PHOS: 169 U/L — AB (ref 38–126)
ALT: 32 U/L (ref 17–63)
AST: 25 U/L (ref 15–41)
Albumin: 2.1 g/dL — ABNORMAL LOW (ref 3.5–5.0)
Anion gap: 10 (ref 5–15)
BUN: 11 mg/dL (ref 6–20)
CHLORIDE: 100 mmol/L — AB (ref 101–111)
CO2: 21 mmol/L — AB (ref 22–32)
CREATININE: 1.28 mg/dL — AB (ref 0.61–1.24)
Calcium: 8 mg/dL — ABNORMAL LOW (ref 8.9–10.3)
GFR calc Af Amer: >60 mL/min (ref >60)
GFR calc non Af Amer: 58 mL/min — ABNORMAL LOW (ref >60)
Glucose, Bld: 115 mg/dL — ABNORMAL HIGH (ref 65–99)
Potassium: 3.8 mmol/L (ref 3.5–5.1)
SODIUM: 131 mmol/L — AB (ref 135–145)
Total Bilirubin: 1.4 mg/dL — ABNORMAL HIGH (ref 0.3–1.2)
Total Protein: 5.7 g/dL — ABNORMAL LOW (ref 6.5–8.1)

## 2015-11-08 LAB — GLUCOSE, CAPILLARY
GLUCOSE-CAPILLARY: 122 mg/dL — AB (ref 65–99)
Glucose-Capillary: 131 mg/dL — ABNORMAL HIGH (ref 65–99)
Glucose-Capillary: 107 mg/dL — ABNORMAL HIGH (ref 65–99)
Glucose-Capillary: 119 mg/dL — ABNORMAL HIGH (ref 65–99)

## 2015-11-08 LAB — HEPARIN LEVEL (UNFRACTIONATED): HEPARIN UNFRACTIONATED: 0.26 [IU]/mL — AB (ref 0.30–0.70)

## 2015-11-08 LAB — CBC
HCT: 31.3 % — ABNORMAL LOW (ref 39.0–52.0)
Hemoglobin: 10.9 g/dL — ABNORMAL LOW (ref 13.0–17.0)
MCH: 32.9 pg (ref 26.0–34.0)
MCHC: 34.8 g/dL (ref 30.0–36.0)
MCV: 94.6 fL (ref 78.0–100.0)
PLATELETS: 196 10*3/uL (ref 150–400)
RBC: 3.31 MIL/uL — ABNORMAL LOW (ref 4.22–5.81)
RDW: 13 % (ref 11.5–15.5)
WBC: 9.5 10*3/uL (ref 4.0–10.5)

## 2015-11-08 LAB — GRAM STAIN

## 2015-11-08 LAB — MAGNESIUM: MAGNESIUM: 1.8 mg/dL (ref 1.7–2.4)

## 2015-11-08 LAB — HEPATIC FUNCTION PANEL
ALT: 33 U/L (ref 17–63)
AST: 26 U/L (ref 15–41)
Albumin: 2.2 g/dL — ABNORMAL LOW (ref 3.5–5.0)
Alkaline Phosphatase: 163 U/L — ABNORMAL HIGH (ref 38–126)
BILIRUBIN DIRECT: 0.5 mg/dL (ref 0.1–0.5)
BILIRUBIN INDIRECT: 0.8 mg/dL (ref 0.3–0.9)
BILIRUBIN TOTAL: 1.3 mg/dL — AB (ref 0.3–1.2)
Total Protein: 5.5 g/dL — ABNORMAL LOW (ref 6.5–8.1)

## 2015-11-08 LAB — PROTIME-INR
INR: 1.15
PROTHROMBIN TIME: 14.8 s (ref 11.4–15.2)

## 2015-11-08 LAB — BRAIN NATRIURETIC PEPTIDE: B NATRIURETIC PEPTIDE 5: 907.9 pg/mL — AB (ref 0.0–100.0)

## 2015-11-08 LAB — SURGICAL PCR SCREEN
MRSA, PCR: NEGATIVE
Staphylococcus aureus: NEGATIVE

## 2015-11-08 LAB — LACTIC ACID, PLASMA: Lactic Acid, Venous: 1.1 mmol/L (ref 0.5–1.9)

## 2015-11-08 MED ORDER — LIDOCAINE HCL 1 % IJ SOLN
INTRAMUSCULAR | Status: AC | PRN
  Administered 2015-11-08: 20 mL

## 2015-11-08 MED ORDER — LIDOCAINE HCL 1 % IJ SOLN
INTRAMUSCULAR | Status: AC | PRN
  Administered 2015-11-08: 10 mL

## 2015-11-08 MED ORDER — ONDANSETRON HCL 4 MG/2ML IJ SOLN
INTRAMUSCULAR | Status: AC
  Filled 2015-11-08: qty 2

## 2015-11-08 MED ORDER — ONDANSETRON HCL 4 MG/2ML IJ SOLN
INTRAMUSCULAR | Status: AC | PRN
  Administered 2015-11-08: 4 mg via INTRAVENOUS

## 2015-11-08 MED ORDER — IOPAMIDOL (ISOVUE-370) INJECTION 76%
INTRAVENOUS | Status: AC
  Administered 2015-11-08: 20:00:00
  Filled 2015-11-08: qty 50

## 2015-11-08 MED ORDER — LEVALBUTEROL HCL 1.25 MG/0.5ML IN NEBU
1.2500 mg | INHALATION_SOLUTION | Freq: Three times a day (TID) | RESPIRATORY_TRACT | Status: DC
  Administered 2015-11-08 – 2015-11-09 (×4): 1.25 mg via RESPIRATORY_TRACT
  Filled 2015-11-08 (×6): qty 0.5

## 2015-11-08 MED ORDER — INSULIN ASPART 100 UNIT/ML ~~LOC~~ SOLN
0.0000 [IU] | SUBCUTANEOUS | Status: DC
  Administered 2015-11-09: 2 [IU] via SUBCUTANEOUS
  Administered 2015-11-09: 3 [IU] via SUBCUTANEOUS

## 2015-11-08 MED ORDER — FENTANYL CITRATE (PF) 100 MCG/2ML IJ SOLN
INTRAMUSCULAR | Status: AC
  Administered 2015-11-08: 17:00:00
  Filled 2015-11-08: qty 2

## 2015-11-08 MED ORDER — MAGIC MOUTHWASH
10.0000 mL | Freq: Three times a day (TID) | ORAL | Status: DC
  Administered 2015-11-08 – 2015-11-13 (×15): 10 mL via ORAL
  Filled 2015-11-08 (×17): qty 10

## 2015-11-08 MED ORDER — LIDOCAINE HCL 1 % IJ SOLN
INTRAMUSCULAR | Status: AC
  Administered 2015-11-08: 17:00:00
  Filled 2015-11-08: qty 20

## 2015-11-08 MED ORDER — MIDAZOLAM HCL 2 MG/2ML IJ SOLN
INTRAMUSCULAR | Status: AC
  Administered 2015-11-08: 17:00:00
  Filled 2015-11-08: qty 2

## 2015-11-08 MED ORDER — TRAVASOL 10 % IV SOLN: INTRAVENOUS | Status: DC

## 2015-11-08 MED ORDER — MIDAZOLAM HCL 2 MG/2ML IJ SOLN
INTRAMUSCULAR | Status: AC | PRN
  Administered 2015-11-08: 1 mg via INTRAVENOUS

## 2015-11-08 MED ORDER — SODIUM CHLORIDE 0.9 % IV BOLUS (SEPSIS)
1000.0000 mL | Freq: Once | INTRAVENOUS | Status: AC
  Administered 2015-11-08: 1000 mL via INTRAVENOUS

## 2015-11-08 MED ORDER — FENTANYL CITRATE (PF) 100 MCG/2ML IJ SOLN
INTRAMUSCULAR | Status: AC | PRN
  Administered 2015-11-08: 50 ug via INTRAVENOUS

## 2015-11-08 MED ORDER — MOMETASONE FURO-FORMOTEROL FUM 200-5 MCG/ACT IN AERO
2.0000 | INHALATION_SPRAY | Freq: Two times a day (BID) | RESPIRATORY_TRACT | Status: DC
  Administered 2015-11-08 – 2015-11-21 (×15): 2 via RESPIRATORY_TRACT
  Filled 2015-11-08 (×7): qty 8.8

## 2015-11-08 MED ORDER — IOPAMIDOL (ISOVUE-300) INJECTION 61%
INTRAVENOUS | Status: AC
  Administered 2015-11-08: 10 mL
  Filled 2015-11-08: qty 50

## 2015-11-08 MED ORDER — TRACE MINERALS CR-CU-MN-SE-ZN 10-1000-500-60 MCG/ML IV SOLN
INTRAVENOUS | Status: AC
  Administered 2015-11-08: 20:00:00 via INTRAVENOUS
  Filled 2015-11-08: qty 960

## 2015-11-08 MED ORDER — LIDOCAINE HCL 1 % IJ SOLN
INTRAMUSCULAR | Status: AC
  Administered 2015-11-08: 18:00:00
  Filled 2015-11-08: qty 20

## 2015-11-08 MED ORDER — FAT EMULSION 20 % IV EMUL
240.0000 mL | INTRAVENOUS | Status: DC
  Administered 2015-11-08: 240 mL via INTRAVENOUS
  Filled 2015-11-08: qty 250

## 2015-11-08 MED ORDER — LEVALBUTEROL HCL 1.25 MG/0.5ML IN NEBU
1.2500 mg | INHALATION_SOLUTION | Freq: Four times a day (QID) | RESPIRATORY_TRACT | Status: DC
  Administered 2015-11-08: 1.25 mg via RESPIRATORY_TRACT
  Filled 2015-11-08: qty 0.5

## 2015-11-08 MED ORDER — HEPARIN (PORCINE) IN NACL 100-0.45 UNIT/ML-% IJ SOLN
1800.0000 [IU]/h | INTRAMUSCULAR | Status: DC
  Administered 2015-11-08 – 2015-11-10 (×5): 2150 [IU]/h via INTRAVENOUS
  Administered 2015-11-11 – 2015-11-13 (×4): 1800 [IU]/h via INTRAVENOUS
  Filled 2015-11-08 (×9): qty 250

## 2015-11-08 MED ORDER — SODIUM CHLORIDE 0.9% FLUSH: 10.0000 mL | INTRAVENOUS | Status: DC | PRN

## 2015-11-08 MED ORDER — METOPROLOL TARTRATE 50 MG PO TABS
50.0000 mg | ORAL_TABLET | Freq: Two times a day (BID) | ORAL | Status: DC
  Administered 2015-11-08 – 2015-11-13 (×12): 50 mg via ORAL
  Filled 2015-11-08 (×12): qty 1

## 2015-11-08 NOTE — Sedation Documentation (Signed)
Patient is resting comfortably. 

## 2015-11-08 NOTE — Progress Notes (Signed)
Patient Name: Brian Casey Date of Encounter: 11/08/2015     Active Problems:   Hypothyroidism   Diabetes ([place])   Anxiety state   NICOTINE ADDICTION   HLD (hyperlipidemia)   Essential hypertension   Cholecystitis   PAF (paroxysmal atrial fibrillation) (HCC)   Acute renal failure (ARF) (HCC)   Right upper quadrant pain   Aortic atherosclerosis (HCC)   Positive cardiac stress test   NSTEMI (non-ST elevated myocardial infarction) ([place])   Coronary artery disease involving native coronary artery of native heart with angina pectoris (HCC)   Moderate major depression, single episode ([place])    SUBJECTIVE  Getting picc line placed for nutrition. No more chest pain  CURRENT MEDS . amiodarone  200 mg Oral BID  . aspirin EC  81 mg Oral Daily  . atorvastatin  40 mg Oral q1800  . diltiazem  30 mg Oral Q6H  . escitalopram  10 mg Oral Daily  . folic acid  1 mg Oral Daily  . insulin aspart  0-9 Units Subcutaneous TID WC  . levalbuterol  1.25 mg Nebulization Q6H  . [START ON 11/09/2015] levothyroxine  100 mcg Oral QAC breakfast  . metoprolol tartrate  25 mg Oral BID  . mometasone-formoterol  2 puff Inhalation BID  . multivitamin with minerals  1 tablet Oral Daily  . pantoprazole  40 mg Oral Daily  . piperacillin-tazobactam (ZOSYN)  IV  3.375 g Intravenous Q8H  . potassium chloride  20 mEq Oral BID  . thiamine  100 mg Oral Daily   Or  . thiamine  100 mg Intravenous Daily    OBJECTIVE  Vitals:   11/07/15 1959 11/08/15 0012 11/08/15 0556 11/08/15 0607  BP: (!) 168/64 (!) 143/62  (!) 153/62  Pulse: 82 66 76 72  Resp: _0 Temp: 100.2 F (37.9 C) 98.4 F (36.9 C)  98.5 F (36.9 C)  TempSrc: Oral Oral  Oral  SpO2: 96% 96%  96%  Weight:    216 lb 8 oz (98.2 kg)  Height:        Intake/Output Summary (Last 24 hours) at 11/08/15 0902 Last data filed at 11/08/15 0616  Gross per 24 hour  Intake              524 ml  Output              700 ml  Net             -176 ml     Filed Weights   11/07/15 1821 11/08/15 0607  Weight: 212 lb 4.8 oz (96.3 kg) 216 lb 8 oz (98.2 kg)    PHYSICAL EXAM  General: Pleasant, NAD. Neuro: Alert and oriented X 3. Moves all extremities spontaneously. Psych: Normal affect. HEENT:  Normal  Neck: Supple without bruits or JVD. Lungs:  Resp regular and unlabored, CTA. Heart: RRR no s3, s4, or murmurs. Abdomen: Soft, non-tender, non-distended, BS + x 4.  Extremities: No clubbing, cyanosis or edema. DP/PT/Radials 2+ and equal bilaterally.  Accessory Clinical Findings  CBC  Recent Labs  11/07/15 0648 11/08/15 0347  WBC 12.3* 13.0*  NEUTROABS  --  11.4*  HGB 11.5* 10.6*  HCT 31.8* 30.6*  MCV 95.9 94.2  PLT 144* 621   Basic Metabolic Panel  Recent Labs  11/07/15 0648 11/08/15 0347  NA 131* 129*  K 3.7 3.6  CL 102 99*  CO2 22 21*  GLUCOSE 150* 110*  BUN 16 11  CREATININE  1.24 1.29*  CALCIUM 7.4* 7.9*  MG  --  1.8   Liver Function Tests  Recent Labs  11/08/15 0347  AST 26  ALT 33  ALKPHOS 163*  BILITOT 1.3*  PROT 5.5*  ALBUMIN 2.2*     TELE  NSR   Radiology/Studies  Ct Abdomen Pelvis Wo Contrast  Result Date: 11/04/2015 CLINICAL DATA:  Right lower quadrant pain extending into the right upper and central abdomen. Symptoms for 2 days. History of hernia repair. EXAM: CT ABDOMEN AND PELVIS WITHOUT CONTRAST TECHNIQUE: Multidetector CT imaging of the abdomen and pelvis was performed following the standard protocol without IV contrast. COMPARISON:  None. FINDINGS: Lower chest: Mild dependent airspace opacities at both lung bases, probably atelectasis. No significant pleural or pericardial effusion. Prominent coronary artery atherosclerosis noted. There is a small hiatal hernia. Hepatobiliary: The hepatic density is diffusely decreased, consistent with steatosis. No focal hepatic abnormalities are identified on noncontrast imaging. The gallbladder is distended with wall thickening and prominent  surrounding inflammatory changes. No calcified gallstones are seen. There is no significant biliary dilatation. Pancreas: Unremarkable. No pancreatic ductal dilatation or surrounding inflammatory changes. Spleen: Normal in size without focal abnormality. Adrenals/Urinary Tract: Both adrenal glands appear normal. Both kidneys demonstrate mild cortical thinning and symmetric perinephric soft tissue stranding. 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 No evidence of renal mass, hydronephrosis or urinary tract calculus. The bladder appears unremarkable. Stomach/Bowel: No evidence bowel wall thickening or distention. The right upper quadrant inflammatory changes do not appear to arise from the duodenum. The appendix appears normal. There is mild distal colonic diverticulosis. Vascular/Lymphatic: There are no enlarged abdominal or pelvic lymph nodes. Aortic and branch vessel atherosclerosis noted. Vascular assessment limited without contrast. Reproductive: There is a focal parenchymal calcification within the right aspect of the prostate gland which is not significantly enlarged. The seminal vesicles appear normal. Other: No evidence of abdominal wall mass or hernia. Musculoskeletal: No acute or significant osseous findings. IMPRESSION: 1. Distended gallbladder with wall thickening and surrounding inflammation highly suspicious for acute cholecystitis. Correlate clinically. No calcified gallstones or biliary dilatation identified. 2. No other acute findings are seen.  The appendix appears normal. 3. Bibasilar atelectasis, hepatic steatosis and mild distal colonic diverticulosis. 4.  Aortic Atherosclerosis (ICD10-170.0) Electronically Signed   By: Richardean Sale M.D.   On: 11/04/2015 10:17   Dg Chest 2 View  Result Date: 11/08/2015 CLINICAL DATA:  Shortness of breath. Abdominal pain. Gallbladder and heart complications. EXAM: CHEST  2 VIEW COMPARISON:  11/10/2013 FINDINGS: Mild cardiac  enlargement with central pulmonary vascular congestion. Mild interstitial pattern to the lungs likely represents early interstitial edema. Changes are new since prior study. Small bilateral pleural effusions. No pneumothorax. Calcified aorta. IMPRESSION: Cardiac enlargement with mild central pulmonary vascular congestion, interstitial edema, and small pleural effusions. Electronically Signed   By: Lucienne Capers M.D.   On: 11/08/2015 01:46   US Renal  Result Date: 11/04/2015 CLINICAL DATA:  Acute renal failure. EXAM: RENAL / URINARY TRACT ULTRASOUND COMPLETE COMPARISON:  None. FINDINGS: Right Kidney: Length: 12.4 cm. Echogenicity within normal limits. No mass or hydronephrosis visualized. Left Kidney: Length: 11.1 cm. Echogenicity within normal limits. No mass or hydronephrosis visualized. Bladder: Appears normal for degree of bladder distention. IMPRESSION: Negative. Electronically Signed   By: Staci Righter M.D.   On: 11/04/2015 17:31   Nm Myocar Multi W/spect W/wall Motion / Ef  Result Date: 11/06/2015 Pharmacological myocardial perfusion imaging study with moderate sized  region of moderate intensity ischemia in the lateral wall Normal wall motion, EF estimated at 68% No EKG changes concerning for ischemia at peak stress or in recovery. High risk scan given lateral wall ischemia, given recent troponin elevation Signed, Esmond Plants, MD, Ph.D [facility]   US Abdomen Limited Ruq  Result Date: 11/04/2015 CLINICAL DATA:  Right-sided abdominal pain for 2 days.  Abnormal CT. EXAM: US ABDOMEN LIMITED - RIGHT UPPER QUADRANT COMPARISON:  CT 11/04/2015. FINDINGS: Gallbladder: Gallbladder wall thickening to 7 mm is noted at the gallbladder fundus. Gallbladder sludge is present. No discrete gallstones are seen. Sonographic Percell Miller sign is absent according to the sonographer. Common bile duct: Diameter: 5 mm. Liver: Increased echogenicity corresponding with steatosis on CT. No focal hepatic abnormalities  identified. IMPRESSION: 1. Gallbladder wall thickening and sludge without gallstones, sonographic Murphy sign or biliary dilatation. 2. I reviewed the CT from earlier today, and the inflammatory changes on that study are in the right upper quadrant with the epicenter at the gallbladder. In conjunction with that study, findings still remain concerning for acalculous cholecystitis. The patient's appendix does lie in the right upper quadrant adjacent to the gallbladder, but appears normal. There is no apparent wall thickening of the duodenum or colon. The pancreas appears normal, and the patient does not have an elevated lipase level, although does have leukocytosis. Electronically Signed   By: Richardean Sale M.D.   On: 11/04/2015 11:35   Procedures  Left Heart Cath and Coronary Angiography  Conclusion   Dist RCA lesion, 95 %stenosed.  Ramus lesion, 90 %stenosed.  Ost Cx lesion, 95 %stenosed.  Mid LAD lesion, 70 %stenosed.  Dist LAD lesion, 60 %stenosed.  Ost LM to LM lesion, 40 %stenosed.      2D ECHO: 11/04/2015 LV EF: 50% -   55% Study Conclusions - Left ventricle: The cavity size was normal. Systolic function was   normal. The estimated ejection fraction was in the range of 50%   to 55%. Wall motion was normal; there were no regional wall   motion abnormalities. Features are consistent with a pseudonormal   left ventricular filling pattern, with concomitant abnormal   relaxation and increased filling pressure (grade 2 diastolic   dysfunction). - Left atrium: The atrium was at the upper limits of normal in   size. - Right ventricle: Systolic function was normal. - Pulmonary arteries: Systolic pressure was within the normal   range. Impressions: - Rhythm is normal sinus.   ASSESSMENT AND PLAN 65 y.o. male with h/o DM2, HTN, HLD, hypothyroidisim, and chronic alcohol abuse presented to [facility] on 11/04/15 with RUQ abdominal and was found to have acute cholecystitis on CT scan as  well as new onset Afib with RVR with heart rates in the 180's bpm. He underwent myoview for pre op risk stratification which was high risk and subsequent LHC showing severe 4VCAD. He was transferred from [facility] to [facility] on 11/07/15 for evaluation by CTCS.    Severe multivessel CAD: severe 4V CAD. Plan is for CTCS to see today. Continue ASA, BB and statin.    PAF: was in afib with RVR on admission and placed on amio gtt. He has since converted to NSR and now on PO amiodarone 251m BID. Started on Lopressor 214mBID for rate control. Also still on Dilt 3089mID. Will discontinue dilt and increase Lopressor to 26m90mD.  -CHADS2VASc at least 3 (HTN, DM, vascular disease) -Heparin gtt, transition to DOACPrimgharTCTS recommendation   Cholecystitis: surgery  deferred at this time. Continue Zosyn. Currently getting PICC line for nutrition. CTCS has consulted general surgery for assistance.   Depression/anxiety: psych saw at [facility] and he was started Lexapro   HTN: BP mildly elevated on diltiazem 15m TID and lopressor 24mBID.  As above, will stop dilt and increase lopressor to 5063mID  HLD: continue Lipitor. Last lipid panel 10/2014 and LDL 88  DM: HgA1c 6.4  Signed, KatAngelena Form-C  Pager [phone] seen, examined. Available data reviewed. Agree with findings, assessment, and plan as outlined by KatAngelena FormA-C. This is an obese male in no distress. Lungs are clear. Heart is regular rate and rhythm. There is no pretibial edema. Complicated situation in a patient with acute cholecystitis and severe multivessel coronary disease. Plans have been outlined with a plan for percutaneous drainage of his gallbladder today followed by multivessel CABG early next week once he is medically stable. The patient is having no anginal symptoms. He is maintaining sinus rhythm at present on amiodarone. It is okay from my perspective for him to hold heparin so that he can have his gallbladder drained and  this can be restarted when it is safe after the procedure is completed.  MicSherren Mocha.D. 11/08/2015 1:53 PM

## 2015-11-08 NOTE — Progress Notes (Addendum)
Daily Progress Note  Asked to see patient with B ICA stenosis in preparation for CABG.  Pt currently in IR.    - Ordered CTA head and neck to evaluate anatomic position of stenoses and evaluate for intracranial disease - Full consult to follow once studies available and PT available (in IR currently)  Adele Barthel, MD [facility] Office: [phone] Pager: [phone]  11/08/2015, 5:26 PM

## 2015-11-08 NOTE — Progress Notes (Signed)
Peripherally Inserted Central Catheter/Midline Placement  The IV Nurse has discussed with the patient and/or persons authorized to consent for the patient, the purpose of this procedure and the potential benefits and risks involved with this procedure.  The benefits include less needle sticks, lab draws from the catheter and patient may be discharged home with the catheter.  Risks include, but not limited to, infection, bleeding, blood clot (thrombus formation), and puncture of an artery; nerve damage and irregular heat beat.  Alternatives to this procedure were also discussed.  PICC/Midline Placement Documentation     Patient requested that his wife sign the consent. Donnald Garre M 11/08/2015, 10:28 AM

## 2015-11-08 NOTE — Sedation Documentation (Signed)
Patient is resting comfortably.

## 2015-11-08 NOTE — Sedation Documentation (Signed)
Vital signs stable.

## 2015-11-08 NOTE — Procedures (Signed)
Interventional Radiology Procedure Note  Procedure:  Cholecystostomy tube.  Complications: None Recommendations:  - Follow output daily - Routine wound care and drain care.  - follow up culture     Signed,  Dulcy Fanny. Earleen Newport, DO

## 2015-11-08 NOTE — Progress Notes (Signed)
[facility] 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

## 2015-11-08 NOTE — Progress Notes (Signed)
**  Preliminary report by tech**  Pre-op Cardiac Surgery  Carotid Findings:   Findings are consistent with a 60-79 percent stenosis involving the right internal carotid artery and 80-99 percent stenosis involving the left internal carotid artery. The vertebral arteries demonstrate antegrade flow.  Upper Extremity Right Left  Brachial Pressures IV 166  Radial Waveforms Triphasic Triphasic  Ulnar Waveforms Triphasic Triphasic  Palmar Arch (Allen's Test) Doppler waveform is within normal limits with radial compression and decreases greater than 50 percent with ulnar compression. Doppler waveform is within normal limits with radial and ulnar compression.   Findings:   Doppler waveform is within normal limits with radial compression and decreases greater than 50 percent with ulnar compression. Doppler waveform is within normal limits with radial and ulnar compression.   Lower  Extremity Right Left  Dorsalis Pedis 179 159  Posterior Tibial 195 188  Ankle/Brachial Indices 1.17 1.13    Findings:   Bilateral ABI's are within normal limits at rest.  Results given to the patient's nurse, At[place].

## 2015-11-08 NOTE — Progress Notes (Signed)
ANTICOAGULATION CONSULT NOTE - Follow Up Consult  Pharmacy Consult for Heparin Indication: atrial fibrillation and multivessel disease  Allergies  Allergen Reactions  . Bupropion Hcl     REACTION: Severe constipation, insomnia  . Penicillins Other (See Comments)    REACTION: Vomiting Has patient had a PCN reaction causing immediate rash, facial/tongue/throat swelling, SOB or lightheadedness with hypotension: Yes Has patient had a PCN reaction causing severe rash involving mucus membranes or skin necrosis: No Has patient had a PCN reaction that required hospitalization No Has patient had a PCN reaction occurring within the last 10 years: No If all of the above answers are "NO", then may proceed with Cephalosporin use.    Patient Measurements: Height: 5' 7" (170.2 cm) Weight: 216 lb 8 oz (98.2 kg) IBW/kg (Calculated) : 66.1 Heparin Dosing Weight: 86.7 kg  Vital Signs: Temp: 98.5 F (36.9 C) (08/18 0607) Temp Source: Oral (08/18 0607) BP: 153/62 (08/18 0607) Pulse Rate: 72 (08/18 0607)  Labs:  Recent Labs  11/06/15 0344 11/07/15 0648 11/07/15 1641 11/08/15 0347  HGB 12.5* 11.5*  --  10.6*  HCT 36.0* 31.8*  --  30.6*  PLT 144* 144*  --  176  LABPROT  --   --   --  14.8  INR  --   --   --  1.15  HEPARINUNFRC 0.42 <0.10* 0.27* 0.26*  CREATININE 1.75* 1.24  --  1.29*    Estimated Creatinine Clearance: 65.4 mL/min (by C-G formula based on SCr of 1.29 mg/dL).   Medications:  Scheduled:  . amiodarone  200 mg Oral BID  . aspirin EC  81 mg Oral Daily  . atorvastatin  40 mg Oral q1800  . escitalopram  10 mg Oral Daily  . folic acid  1 mg Oral Daily  . insulin aspart  0-9 Units Subcutaneous TID WC  . insulin aspart  0-9 Units Subcutaneous Q4H  . levalbuterol  1.25 mg Nebulization Q6H  . [START ON 11/09/2015] levothyroxine  100 mcg Oral QAC breakfast  . metoprolol tartrate  50 mg Oral BID  . mometasone-formoterol  2 puff Inhalation BID  . pantoprazole  40 mg Oral Daily   . piperacillin-tazobactam (ZOSYN)  IV  3.375 g Intravenous Q8H  . potassium chloride  20 mEq Oral BID  . thiamine  100 mg Oral Daily   Or  . thiamine  100 mg Intravenous Daily   Infusions:  . Marland KitchenTPN (CLINIMIX-E) Adult     And  . fat emulsion    . heparin 1,950 Units/hr (11/07/15 1826)    Assessment: 65 y.o.male presented to [facility] on 11/04/15 with RUQ abdominal and was found to have acute cholecystitis on CT scan as well as new onset Afib with RVR with heart rates in the 180's bpm. He underwent myoview for pre op risk stratification which was high risk and subsequent LHC showing severe 4VCAD. He was transferred from [facility] to [facility] on 11/07/15 for evaluation by CTCS.   Heparin started for new afib + multivessel disease (TCTS consult pending).  Heparin level is slightly < goal despite rate increase yesterday.  Will increase accordingly.  Goal of Therapy:  Heparin level 0.3-0.7 units/ml Monitor platelets by anticoagulation protocol: Yes   Plan:  Increase heparin to 2150 units/hr Repeat heparin level in 6 hours Daily HL/CBC Monitor s/sx bleeding Follow-up CABG plans  [facility], Pharm.D., BCPS Clinical Pharmacist Pager [phone] 11/08/2015 10:33 AM

## 2015-11-08 NOTE — Progress Notes (Signed)
ANTICOAGULATION CONSULT NOTE - Follow Up Consult  Pharmacy Consult for Heparin Indication: atrial fibrillation and multivessel disease  Allergies  Allergen Reactions  . Bupropion Hcl     REACTION: Severe constipation, insomnia  . Penicillins Other (See Comments)    REACTION: Vomiting Has patient had a PCN reaction causing immediate rash, facial/tongue/throat swelling, SOB or lightheadedness with hypotension: Yes Has patient had a PCN reaction causing severe rash involving mucus membranes or skin necrosis: No Has patient had a PCN reaction that required hospitalization No Has patient had a PCN reaction occurring within the last 10 years: No If all of the above answers are "NO", then may proceed with Cephalosporin use.    Patient Measurements: Height: _0  (170.2 cm) Weight: 216 lb 8 oz (98.2 kg) IBW/kg (Calculated) : 66.1 Heparin Dosing Weight: 86.7 kg  Vital Signs: Temp: 97.9 F (36.6 C) (08/18 1356) Temp Source: Oral (08/18 1356) BP: 161/76 (08/18 1804) Pulse Rate: 111 (08/18 1804)  Labs:  Recent Labs  11/06/15 0344 11/07/15 0648 11/07/15 1641 11/08/15 0347  HGB 12.5* 11.5*  --  10.6*  HCT 36.0* 31.8*  --  30.6*  PLT 144* 144*  --  176  LABPROT  --   --   --  14.8  INR  --   --   --  1.15  HEPARINUNFRC 0.42 <0.10* 0.27* 0.26*  CREATININE 1.75* 1.24  --  1.29*    Estimated Creatinine Clearance: 65.4 mL/min (by C-G formula based on SCr of 1.29 mg/dL).   Assessment: 65 y.o.male presented to [facility] on 11/04/15 with RUQ abdominal and was found to have acute cholecystitis on CT scan as well as new onset Afib with RVR with heart rates in the 180's bpm. He underwent myoview for pre op risk stratification which was high risk and subsequent LHC showing severe 4VCAD. He was transferred from [facility] to [facility] on 11/07/15 for evaluation by CTCS.   Heparin started for new afib + multivessel disease (TCTS consult pending).    S/p cholecystostomy tube this evening, to resume heparin  4 hours after procedure.   Goal of Therapy:  Heparin level 0.3-0.7 units/ml Monitor platelets by anticoagulation protocol: Yes   Plan:  Heparin to resume at 2200 pm at 2150 units / hr Daily HL/CBC Monitor s/sx bleeding Follow-up CABG plans  Thank you Anette Guarneri, PharmD [phone] 11/08/2015 7:04 PM

## 2015-11-08 NOTE — Progress Notes (Signed)
Rapid Resp. R.N,. Here and assess patient M.D. Page x 2 and she assume patient care at this point.M.D. Last page at [place] and call return and will come see patient See orders

## 2015-11-08 NOTE — Progress Notes (Signed)
Called to bedside by RRT nurse. RRT was called for tachycardia and fever. Late this afternoon patient was noted to be febrile >102 not responsive to tylenol. Also with sudden increase to HR >130. Previously with controlled afib rate. Patient complains of no symptoms. BP >130 syst. Earlier today the patient underwent a percutaneous biliary drain in anticipation of CABG next week. I bleiev he became septic from that procedure.  Plan: - Will transfer to step down - Will give 1L NS and start on maintanance fluids - Will start on Vanc/Zosyn after obtaining blood cultures.

## 2015-11-08 NOTE — Progress Notes (Signed)
PARENTERAL NUTRITION CONSULT NOTE - INITIAL  Pharmacy Consult for tpn Indication: NPO, poor oral intake  Allergies  Allergen Reactions  . Bupropion Hcl     REACTION: Severe constipation, insomnia  . Penicillins Other (See Comments)    REACTION: Vomiting Has patient had a PCN reaction causing immediate rash, facial/tongue/throat swelling, SOB or lightheadedness with hypotension: Yes Has patient had a PCN reaction causing severe rash involving mucus membranes or skin necrosis: No Has patient had a PCN reaction that required hospitalization No Has patient had a PCN reaction occurring within the last 10 years: No If all of the above answers are "NO", then may proceed with Cephalosporin use.    Patient Measurements: Height: _0  (170.2 cm) Weight: 216 lb 8 oz (98.2 kg) IBW/kg (Calculated) : 66.1 Adjusted Body Weight: 79 kg Usual Weight: 94 kg on admit  Body mass index is 33.91 kg/m.  Vital Signs: Temp: 98.5 F (36.9 C) (08/18 0607) Temp Source: Oral (08/18 0607) BP: 153/62 (08/18 6754) Pulse Rate: 72 (08/18 0607) Intake/Output from previous day: 08/17 0701 - 08/18 0700 In: 524 [P.O.:240; I.V.:234; IV Piggyback:50] Out: 700 [Urine:700] Intake/Output from this shift: No intake/output data recorded.  Labs:  Recent Labs  11/06/15 0344 11/07/15 0648 11/08/15 0347  WBC 12.7* 12.3* 13.0*  HGB 12.5* 11.5* 10.6*  HCT 36.0* 31.8* 30.6*  PLT 144* 144* 176  INR  --   --  1.15     Recent Labs  11/06/15 0344 11/07/15 0648 11/08/15 0347  NA 127* 131* 129*  K 4.0 3.7 3.6  CL 97* 102 99*  CO2 24 22 21*  GLUCOSE 177* 150* 110*  BUN 24* 16 11  CREATININE 1.75* 1.24 1.29*  CALCIUM 8.0* 7.4* 7.9*  MG  --   --  1.8  PROT  --   --  5.5*  ALBUMIN  --   --  2.2*  AST  --   --  26  ALT  --   --  33  ALKPHOS  --   --  163*  BILITOT  --   --  1.3*  BILIDIR  --   --  0.5  IBILI  --   --  0.8   Estimated Creatinine Clearance: 65.4 mL/min (by C-G formula based on SCr of  1.29 mg/dL).    Recent Labs  11/07/15 1814 11/07/15 2043 11/08/15 0605  GLUCAP 124* 127* 131*    Medical History: Past Medical History:  Diagnosis Date  . Acute renal failure ([place]) 11/06/2015   [name] 11/06/2015  . Anxiety   . Arthritis    "lower spine; fingers" (11/07/2015)  . Depression   . GERD (gastroesophageal reflux disease)   . High cholesterol   . Hypertension 10/17/2013  . Hyperthyroidism    "had it radiated in his '29s"  . Hypothyroidism   . New onset atrial fibrillation ([place]) 11/06/2015   [name] 11/07/2015  . NSTEMI (non-ST elevated myocardial infarction) ([place]) 11/07/2015  . Thyroid disease   . Type II diabetes mellitus ([facility])     Medications:  Scheduled:  . amiodarone  200 mg Oral BID  . aspirin EC  81 mg Oral Daily  . atorvastatin  40 mg Oral q1800  . diltiazem  30 mg Oral Q6H  . escitalopram  10 mg Oral Daily  . folic acid  1 mg Oral Daily  . insulin aspart  0-9 Units Subcutaneous TID WC  . levalbuterol  1.25 mg Nebulization Q6H  . [START ON 11/09/2015] levothyroxine  100  mcg Oral QAC breakfast  . metoprolol tartrate  25 mg Oral BID  . mometasone-formoterol  2 puff Inhalation BID  . multivitamin with minerals  1 tablet Oral Daily  . pantoprazole  40 mg Oral Daily  . piperacillin-tazobactam (ZOSYN)  IV  3.375 g Intravenous Q8H  . potassium chloride  20 mEq Oral BID  . thiamine  100 mg Oral Daily   Or  . thiamine  100 mg Intravenous Daily    Insulin Requirements in the past 24 hours:  4 units sensitive SSI  Assessment: 65 y/o male who presented to [facility] on 8/14 with RUQ abdominal pain found to have acute cholecystitis and new onset Afib. He underwent a stress test for cardiac clearance which was abnormal therefore had a cath on 8/16 which showed severe 4 vessel CAD. He was transferred to [facility] for CABG evaluation. Cholecystectomy has been delayed. Since his admission at [facility] he has had poor oral intake. Enteral feeds have NOT been attempted.  Spoke with  Dr. Prescott Gum to discuss use of tpn in this patient. Explained trying enteral feeds is recommended over starting tpn. Dr. Prescott Gum declined trying enteral feeds and would like to proceed with tpn.  GI: acute cholecystitis - surgery delayed due to severe CAD, last BM 8/17, albumin 2.2 Endo: DM, CBGs mostly <150 on sensitive SSI ; hypothyroid - levothy Lytes: Na 129, CoCa 9.2, Mg 1.8, K 3.6 Renal: AKI on CKD 2, SCr down to 1.29 Pulm: RA - dulera Cards: NSTEMI, severe 4 vessel CAD on cath - planning CABG; new onset Afib - diltiazem AC/heme: on heparin drip Hepatobil: LFTs wnl, tbili mild elevated, alk phos 163 Neuro: EtOH abuse - lexapro, MVI, thiamine ID:  S/p Zosyn, Tm 100.2, WBC 13  Best Practices: IV heparin  TPN Access: double lumen PICC to be placed today TPN start date: 8/18>>  Current Nutrition:  NPO except sips with medications  Nutritional Goals:  2000-2400 kCal, 120-150 grams of protein per day  Plan:  Begin Clinimix E 5/15 at 40 ml/hr and 20% lipid emulsion at 10 ml/hr. This provides 48 g of protein and 1162 kCals per day meeting 40% of minimum protein and 58% of minimal kCal needs Add MVI and TE in TPN - d/c oral MVI Change to sensitive SSI q4h and adjust as needed Monitor TPN labs F/U RD recommendations   Renold Genta, PharmD, BCPS Clinical Pharmacist Phone for today - [place] - [phone] 11/08/2015 8:40 AM

## 2015-11-08 NOTE — H&P (Signed)
Chief Complaint: Acute Cholecystitis  Referring Physician(s): Dr. Fanny Skates    Supervising Physician: Corrie Mckusick  Patient Status: Inpatient  History of Present Illness: Brian Casey is a 65 y.o. male who was transferred from [facility] because of coronary artery disease and acute cholecystitis.    He was admitted to [facility] on 11/04/2015.   He has multiple cardiac risk factors including diabetes hypertension and hyperlipidemia.    He also has chronic alcohol abuse.    He presented with right upper quadrant abdominal pain and was found to have  acalculus cholecystitis on CT scan.    US showed gallbladder wall thickening and sludge without gallstones, sonographic Murphy sign or biliary dilatation.  He was in atrial fibrillation with RVR.   He stated he has had abdominal pain 3 days prior to admission and may have had a similar episode about 6 weeks before.  Initial leukocytosis of 24,000.  WBC now is 13,000.  He has been on Zosyn.  Liver function tests are normal.     Cardiac catheterization revealed severe three-vessel coronary artery disease.    He  did rule in for NSTEMI.   He has been seen by cardiology and by cardiothoracic surgery.    Currently he is stable from a cardiac standpoint and their plan is to proceed with coronary artery bypass grafting next week. He is currently on Heparin Drip  He is a poor candidate for general anesthesia and a cholecystectomy at this time and we have been asked to perform an image guided percutaneous cholecystostomy.   He understands that this will probably improve his pain and stabilize his infectious process and allow him to proceed with coronary artery bypass grafting next week.    Per Dr. Darrel Hoover note, he can be considered for definitive cholecystectomy in 2 or 3 months if his cardiac status has improved.  He is NPO.   Past Medical History:  Diagnosis Date  . Acute renal failure ([place])  11/06/2015   [name] 11/06/2015  . Anxiety   . Arthritis    "lower spine; fingers" (11/07/2015)  . Depression   . GERD (gastroesophageal reflux disease)   . High cholesterol   . Hypertension 10/17/2013  . Hyperthyroidism    "had it radiated in his '68s"  . Hypothyroidism   . New onset atrial fibrillation ([place]) 11/06/2015   [name] 11/07/2015  . NSTEMI (non-ST elevated myocardial infarction) ([place]) 11/07/2015  . Thyroid disease   . Type II diabetes mellitus ([place])     Past Surgical History:  Procedure Laterality Date  . CARDIAC CATHETERIZATION Right 11/06/2015   Procedure: Left Heart Cath and Coronary Angiography;  Surgeon: Minna Merritts, MD;  Location: [facility];  Service: Cardiovascular;  Laterality: Right;  . COLONOSCOPY    . FINGER GANGLION CYST EXCISION Left X 3   "index finger X 2; thumb X 1"  . [place]  . HERNIA REPAIR    . INSERTION OF MESH N/A 11/16/2013   Procedure: INSERTION OF MESH;  Surgeon: Imogene Burn. Georgette Dover, MD;  Location: [facility];  Service: General;  Laterality: N/A;  . UMBILICAL HERNIA REPAIR N/A 11/16/2013   Procedure: UMBILICAL HERNIA REPAIR;  Surgeon: Imogene Burn. Georgette Dover, MD;  Location: [facility];  Service: General;  Laterality: N/A;    Allergies: Bupropion hcl and Penicillins  Medications: Prior to Admission medications   Medication Sig Start Date End Date Taking? Authorizing Provider  ALPRAZolam Duanne Moron) 0.25 MG tablet Take 1  tablet (0.25 mg total) by mouth 2 (two) times daily as needed. Patient taking differently: Take 0.25 mg by mouth 2 (two) times daily as needed for anxiety.  11/06/14  Yes Lucille Passy, MD  levothyroxine (SYNTHROID, LEVOTHROID) 100 MCG tablet Take 1 tablet (100 mcg total) by mouth daily before breakfast. OFFICE VISIT WITH  LABS REQUIRED FOR ADDITIONAL REFILLS 10/07/15  Yes Lucille Passy, MD  losartan-hydrochlorothiazide (HYZAAR) 100-25 MG tablet Take 1 tablet by mouth daily.   Yes Historical Provider, MD  metFORMIN  (GLUCOPHAGE) 500 MG tablet Take 500 mg by mouth 2 (two) times daily with a meal.   Yes Historical Provider, MD  omeprazole (PRILOSEC) 20 MG capsule Take 1 capsule (20 mg total) by mouth daily. OFFICE VISIT REQUIRED FOR ADDITIONAL REFILLS 10/07/15  Yes Lucille Passy, MD  Probiotic Product (PROBIOTIC PO) Take by mouth.   Yes Historical Provider, MD  simvastatin (ZOCOR) 40 MG tablet Take 1 tablet (40 mg total) by mouth at bedtime. 12/10/14  Yes Lucille Passy, MD  amiodarone (PACERONE) 400 MG tablet Take 1 tablet (400 mg total) by mouth 2 (two) times daily. Patient not taking: Reported on 11/07/2015 11/07/15   Bettey Costa, MD  aspirin 81 MG chewable tablet Chew 1 tablet (81 mg total) by mouth before cath procedure. Patient not taking: Reported on 11/07/2015 11/07/15   Bettey Costa, MD  diltiazem (CARDIZEM) 30 MG tablet Take 1 tablet (30 mg total) by mouth 3 (three) times daily. Patient not taking: Reported on 11/07/2015 11/07/15   Bettey Costa, MD  escitalopram (LEXAPRO) 10 MG tablet Take 1 tablet (10 mg total) by mouth daily. Patient not taking: Reported on 11/07/2015 11/07/15   Bettey Costa, MD  metoprolol tartrate (LOPRESSOR) 25 MG tablet Take 1 tablet (25 mg total) by mouth 2 (two) times daily. Patient not taking: Reported on 11/07/2015 11/07/15   Bettey Costa, MD  piperacillin-tazobactam (ZOSYN) 3.375 GM/50ML IVPB Inject 50 mLs (3.375 g total) into the vein every 8 (eight) hours. Patient not taking: Reported on 11/07/2015 11/06/15   Bettey Costa, MD  temazepam (RESTORIL) 15 MG capsule Take 1 capsule (15 mg total) by mouth at bedtime as needed for sleep. Patient not taking: Reported on 11/07/2015 11/07/15   Bettey Costa, MD     Family History  Problem Relation Age of Onset  . Cancer Mother     colon  . Hypertension Mother   . Diabetes Mother   . Depression Mother   . Colon cancer Mother   . Hyperthyroidism Father   . Hypertension Father   . Cancer Father     prostate  . Diabetes Maternal Grandmother   . Cancer  Maternal Grandmother     lung, non smoker    Social History   Social History  . Marital status: Married    Spouse name: N/A  . Number of children: 2  . Years of education: N/A   Occupational History  . Product Engineer-terminated, Engineer, manufacturing systems    Social History Main Topics  . Smoking status: Current Every Day Smoker    Packs/day: 0.12    Years: 48.00    Types: Cigarettes  . Smokeless tobacco: Never Used     Comment: 11/07/2015 "I've never inhaled"  . Alcohol use 34.2 oz/week    36 Cans of beer, 21 Shots of liquor per week     Comment: 8/17.2017 "last drink was 11/03/2015 which I threw out"  . Drug use: No  . Sexual  activity: Yes   Other Topics Concern  . None   Social History Narrative  . None   Review of Systems: A 12 point ROS discussed Review of Systems  Constitutional: Positive for activity change and fatigue. Negative for appetite change, chills and fever.  HENT: Negative.   Respiratory: Negative for cough and shortness of breath.   Cardiovascular: Positive for chest pain.  Gastrointestinal: Positive for abdominal pain and nausea.  Genitourinary: Negative.   Musculoskeletal: Negative.   Skin: Negative.   Neurological: Negative.   Hematological: Negative.   Psychiatric/Behavioral: Negative.     Vital Signs: BP (!) 155/70 (BP Location: Left Arm)   Pulse 76   Temp 97.9 F (36.6 C) (Oral)   Resp 19   Ht _0  (1.702 m)   Wt 216 lb 8 oz (98.2 kg)   SpO2 99%   BMI 33.91 kg/m   Physical Exam  Constitutional: He is oriented to person, place, and time. He appears well-developed and well-nourished.  HENT:  Head: Normocephalic and atraumatic.  Eyes: EOM are normal.  Neck: Normal range of motion.  Cardiovascular: Normal rate, regular rhythm and normal heart sounds.   Pulmonary/Chest: Effort normal and breath sounds normal. He has no wheezes.  Abdominal: Soft. There is tenderness.  Musculoskeletal: Normal range of motion.    Neurological: He is alert and oriented to person, place, and time.  Skin: Skin is warm and dry.  Psychiatric: He has a normal mood and affect. His behavior is normal. Judgment and thought content normal.  Vitals reviewed.   Mallampati Score:  MD Evaluation Airway: WNL Heart: WNL Abdomen: WNL Chest/ Lungs: WNL ASA  Classification: 3 Mallampati/Airway Score: Two  Imaging: Ct Abdomen Pelvis Wo Contrast  Result Date: 11/04/2015 CLINICAL DATA:  Right lower quadrant pain extending into the right upper and central abdomen. Symptoms for 2 days. History of hernia repair. EXAM: CT ABDOMEN AND PELVIS WITHOUT CONTRAST TECHNIQUE: Multidetector CT imaging of the abdomen and pelvis was performed following the standard protocol without IV contrast. COMPARISON:  None. FINDINGS: Lower chest: Mild dependent airspace opacities at both lung bases, probably atelectasis. No significant pleural or pericardial effusion. Prominent coronary artery atherosclerosis noted. There is a small hiatal hernia. Hepatobiliary: The hepatic density is diffusely decreased, consistent with steatosis. No focal hepatic abnormalities are identified on noncontrast imaging. The gallbladder is distended with wall thickening and prominent surrounding inflammatory changes. No calcified gallstones are seen. There is no significant biliary dilatation. Pancreas: Unremarkable. No pancreatic ductal dilatation or surrounding inflammatory changes. Spleen: Normal in size without focal abnormality. Adrenals/Urinary Tract: Both adrenal glands appear normal. Both kidneys demonstrate mild cortical thinning and symmetric perinephric soft tissue stranding. 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 No evidence of renal mass, hydronephrosis or urinary tract calculus. The bladder appears unremarkable. Stomach/Bowel: No evidence bowel wall thickening or distention. The right upper quadrant inflammatory changes do not appear to  arise from the duodenum. The appendix appears normal. There is mild distal colonic diverticulosis. Vascular/Lymphatic: There are no enlarged abdominal or pelvic lymph nodes. Aortic and branch vessel atherosclerosis noted. Vascular assessment limited without contrast. Reproductive: There is a focal parenchymal calcification within the right aspect of the prostate gland which is not significantly enlarged. The seminal vesicles appear normal. Other: No evidence of abdominal wall mass or hernia. Musculoskeletal: No acute or significant osseous findings. IMPRESSION: 1. Distended gallbladder with wall thickening and surrounding inflammation highly suspicious for acute cholecystitis. Correlate clinically. No calcified gallstones  or biliary dilatation identified. 2. No other acute findings are seen.  The appendix appears normal. 3. Bibasilar atelectasis, hepatic steatosis and mild distal colonic diverticulosis. 4.  Aortic Atherosclerosis (ICD10-170.0) Electronically Signed   By: Richardean Sale M.D.   On: 11/04/2015 10:17   Dg Chest 2 View  Result Date: 11/08/2015 CLINICAL DATA:  Shortness of breath. Abdominal pain. Gallbladder and heart complications. EXAM: CHEST  2 VIEW COMPARISON:  11/10/2013 FINDINGS: Mild cardiac enlargement with central pulmonary vascular congestion. Mild interstitial pattern to the lungs likely represents early interstitial edema. Changes are new since prior study. Small bilateral pleural effusions. No pneumothorax. Calcified aorta. IMPRESSION: Cardiac enlargement with mild central pulmonary vascular congestion, interstitial edema, and small pleural effusions. Electronically Signed   By: Lucienne Capers M.D.   On: 11/08/2015 01:46   US Renal  Result Date: 11/04/2015 CLINICAL DATA:  Acute renal failure. EXAM: RENAL / URINARY TRACT ULTRASOUND COMPLETE COMPARISON:  None. FINDINGS: Right Kidney: Length: 12.4 cm. Echogenicity within normal limits. No mass or hydronephrosis visualized. Left  Kidney: Length: 11.1 cm. Echogenicity within normal limits. No mass or hydronephrosis visualized. Bladder: Appears normal for degree of bladder distention. IMPRESSION: Negative. Electronically Signed   By: Staci Righter M.D.   On: 11/04/2015 17:31   Nm Myocar Multi W/spect W/wall Motion / Ef  Result Date: 11/06/2015 Pharmacological myocardial perfusion imaging study with moderate sized region of moderate intensity ischemia in the lateral wall Normal wall motion, EF estimated at 68% No EKG changes concerning for ischemia at peak stress or in recovery. High risk scan given lateral wall ischemia, given recent troponin elevation Signed, Esmond Plants, MD, Ph.D [facility]   US Abdomen Limited Ruq  Result Date: 11/04/2015 CLINICAL DATA:  Right-sided abdominal pain for 2 days.  Abnormal CT. EXAM: US ABDOMEN LIMITED - RIGHT UPPER QUADRANT COMPARISON:  CT 11/04/2015. FINDINGS: Gallbladder: Gallbladder wall thickening to 7 mm is noted at the gallbladder fundus. Gallbladder sludge is present. No discrete gallstones are seen. Sonographic Percell Miller sign is absent according to the sonographer. Common bile duct: Diameter: 5 mm. Liver: Increased echogenicity corresponding with steatosis on CT. No focal hepatic abnormalities identified. IMPRESSION: 1. Gallbladder wall thickening and sludge without gallstones, sonographic Murphy sign or biliary dilatation. 2. I reviewed the CT from earlier today, and the inflammatory changes on that study are in the right upper quadrant with the epicenter at the gallbladder. In conjunction with that study, findings still remain concerning for acalculous cholecystitis. The patient's appendix does lie in the right upper quadrant adjacent to the gallbladder, but appears normal. There is no apparent wall thickening of the duodenum or colon. The pancreas appears normal, and the patient does not have an elevated lipase level, although does have leukocytosis. Electronically Signed   By: Richardean Sale M.D.   On: 11/04/2015 11:35    Labs:  CBC:  Recent Labs  11/05/15 0450 11/06/15 0344 11/07/15 0648 11/08/15 0347  WBC 13.6* 12.7* 12.3* 13.0*  HGB 12.1* 12.5* 11.5* 10.6*  HCT 34.1* 36.0* 31.8* 30.6*  PLT 128* 144* 144* 176    COAGS:  Recent Labs  11/04/15 0841 11/08/15 0347  INR 1.14 1.15  APTT 27  --     BMP:  Recent Labs  11/05/15 0450 11/06/15 0344 11/07/15 0648 11/08/15 0347  NA 125* 127* 131* 129*  K 3.7 4.0 3.7 3.6  CL 92* 97* 102 99*  CO2 _0 21*  GLUCOSE 124* 177* 150* 110*  BUN 35* 24* 16 11  CALCIUM 7.6* 8.0* 7.4* 7.9*  CREATININE 2.10* 1.75* 1.24 1.29*  GFRNONAA 32* 40* >60 57*  GFRAA 37* 46* >60 >60    LIVER FUNCTION TESTS:  Recent Labs  11/04/15 0841 11/05/15 0450 11/08/15 0347  BILITOT 1.8* 1.1 1.3*  AST _0 ALT 22 20 33  ALKPHOS 55 66 163*  PROT 6.9 5.6* 5.5*  ALBUMIN 3.5 2.7* 2.2*    TUMOR MARKERS: No results for input(s): AFPTM, CEA, CA199, CHROMGRNA in the last 8760 hours.  Assessment and Plan:  Acute Cholecystitis  NSTEMI (for CABG next week)  Poor candidate for Lap Chole  Will proceed with Image guided percutaneous cholecystostomy today by Dr. Earleen Newport.  Risks and Benefits discussed with the patient including, but not limited to bleeding, infection, gallbladder perforation, bile leak, sepsis or even death.  All of the patient's questions were answered, patient is agreeable to proceed. Consent signed and in chart.  Thank you for this interesting consult.  I greatly enjoyed meeting NIJEL FLINK and look forward to participating in their care.  A copy of this report was sent to the requesting provider on this date.  Electronically Signed: Murrell Redden PA-C 11/08/2015, 2:38 PM   I spent a total of 40 Minutes in face to face in clinical consultation, greater than 50% of which was counseling/coordinating care for perc chole.

## 2015-11-08 NOTE — Consult Note (Addendum)
Gen. surgery consultation:  History:   This is a 65 year old Caucasian male who was transferred from [facility] yesterday because of coronary artery disease and acute cholecystitis.  He was admitted to [facility] on 11/04/2015.  Multiple cardiac risk factors including diabetes hypertension and hyperlipidemia.  Also chronic alcohol  And tobacco abuse.  He presented with right upper quadrant abdominal pain and was found to have what appears to be acalculus cholecystitis on CT scan.  He is found to be in atrial fibrillation with RVR.  He stated he had had abdominal pain 3 days prior to admission and may have had a similar episode about 6 weeks before.     Abdominal ultrasound shows some wall thickening and sludge but no stones.  Initial leukocytosis of 24,000.  WBC now is 13,000.  He has been on Zosyn.  Liver function tests are normal.      At [facility] he was started on vancomycin and Zosyn.  He is better but still has some right upper quadrant pain.      Cardiac catheterization revealed severe three-vessel coronary artery disease.  He was then transferred to [facility] he did rule in for NSTEMI.   He has been seen by cardiology and by cardiothoracic surgery.  He is stable from a cardiac standpoint.  Their plan is to proceed with coronary artery bypass grafting next week.      He has had an umbilical hernia repair with mesh by Dr. Georgette Dover in the past.      Given his imaging findings and physical exam, it is fairly obvious that he has acute cholecystitis.  Hopefully this is not gangrenous.  He is a very poor candidate for general anesthesia and a cholecystectomy at this time.  I have advised and described percutaneous cholecystostomy to him and his family and they are in agreement.  Will cancel the HIDA scan and proceed with  interventional radiology to drain his gallbladder.     He knows that this will probably improve his pain and stabilize his infectious process and  allow him to proceed with coronary artery bypass grafting next week.  He knows that consideration can be given to definitive interval cholecystectomy in 2 or 3 months if his cardiac status has improved.   Exam:    Gen.  Alert.  Very flattened affect.  Sitting in a chair.  Does not appear short of breath.    Lungs: Clear to auscultation    Heart: Regular rate and rhythm.  No murmur    Abdomen: Obese and protuberant.  Localized tenderness with guarding in the right upper quadrant.  Cannot feel a mass.        Abdomen is otherwise nontender.  No hernia at umbilicus.  Appears well healed.     Extremities.  Pulses intact.  He does have some ankle edema.      Neurologic: Moves all 4 extremities to command.  Oriented 4 but very flattened affect.  He has good insight and understands the consequences of his decision-making.  Assessment:    Acute acalculous cholecystitis.  This is an ongoing process despite 4 or 5 days of intravenous antibiotics.  Leukocytosis has improved but has not resolved.  I do not think this will resolve with antibiotics alone.     Severe three-vessel coronary artery disease and recent  NST EMI.  Positive cardiac stress test.  ``Preop for CABG next week.    History umbilical hernia repair with mesh    Diabetes  Moderate major depression, single episode    Paroxysmal atrial fibrillation with RVR    essential hypertension    Nicotine addiction    History alcohol use  Plan:    For his acute cholecystitis, suspect secondary bacterial infection.  This infection needs to be controlled prior to CABG, if possible      He is not a candidate for laparoscopic or open cholecystectomy at this time due to his coronary disease     Continue antibiotics     Proceed with interventional radiology placement of percutaneous cholecystostomy tube.  Send fluid for cultures.     We'll follow     Assuming that his cholecystitis stabilizes, consideration can be given to interval cholecystectomy in 2  months or so if his cardiac status will allow.   Edsel Petrin. Dalbert Batman, M.D., [facility] General and Minimally invasive Surgery Breast and Colorectal Surgery Office:   [phone] Pager:   [phone]

## 2015-11-08 NOTE — Progress Notes (Signed)
Report call to R.N. On [place]. Family present and aware of tonight events and has spoken with M.D. And Rapid Response R.N. At length. Patient transfer with R.N. Monitor O2 via bed and with belongings.

## 2015-11-08 NOTE — Progress Notes (Signed)
Receiving report from first shift R.N. And patient had starting to have chills unable to get temp. Orally. We did rectal temp.102.1 Patient in At Flutter rate 130-140 Skin warm and dry resp 28 dec B.S. In bases. See flow sheet for V.S.Heard no B.S. abdo firm and distended Rapid resp. R.N. Call and on her way to see patient. Charge R.N. Darryl aware

## 2015-11-08 NOTE — Progress Notes (Signed)
CARDIAC REHAB PHASE I   PRE:  Rate/Rhythm: 84 SR BP:  Sitting: 146/66        SaO2: 98 2L  MODE:  Ambulation: 300 ft   POST:  Rate/Rhythm: 113 a fib  BP:  Sitting: 161/85         SaO2: 97 2L  Pt ambulated 300 ft on 2L O2, IV, assist x1, slow, fairly steady gait, tolerated fairly well. Pt c/o moderate DOE, mild fatigue, denies CP, dizziness, brief standing rest x1. Pt back in a fib by end of walk. Cardiac surgery pre-op education completed with pt and family at bedside (wife, sister and daughter). Reviewed IS, sternal precautions, activity progression, cardiac surgery booklet and cardiac surgery guidelines. Pt and family verbalized understanding. Pt somewhat quiet, states he is overwhelmed, family asked many questions, anxious. Pt to recliner after walk, call bell within reach. Will follow.   [id number] Lenna Sciara, RN, BSN 11/08/2015 11:21 AM

## 2015-11-08 NOTE — Consult Note (Signed)
[address]       [place] [address]             [phone]        Yeray D Adamek Awendaw Medical Record #[id number] Date of Birth: [birth year]  Referring: No ref. provider found Primary Care: [name], MD  Chief Complaint:   New onset atrial fibrillation with RVR and elevated troponins/NSTEMI  History of Present Illness:    The patient is a 65 year old male who presented to [facility] on 11/04/2015. Has multiple cardiac risk factors including DM 2, hypertension, and hyperlipidemia as well as multiple medical comorbidities including hypothyroidism and chronic alcohol abuse. He presented to the emergency department at [facility] with right upper quadrant abdominal pain and was found to have acute cholecystitis on CT scan. He was also found to be in atrial fibrillation with RVR with heart rates into the 180's bpm. He noted the abdominal pain 3 days prior but denied fevers or chills. He did note decreased by mouth intake. Abdominal ultrasound confirmed findings consistent with wall thickening and sludge without gallstones. CT scan findings did confirm acalculous cholecystitis. He was found to have a significant leukocytosis with white blood cell count of 24,000. Additionally he had hyponatremia with sodium of 121. He was found also to have some acute renal failure with a creatinine of 2.4. He was started on intravenous vancomycin and Zosyn by the internal medicine service and cardiology consultation was obtained. He was placed on amiodarone. Rate is  into good control with this as well as diltiazem. He was started on heparin as well. Troponins were noted to be elevated an echocardiogram was also done. It was felt that nuclear stress testing was appropriate and this was performed. This was felt to be a high-risk scan showing lateral wall ischemia. Cardiac catheterization was then performed and this revealed severe three-vessel coronary  artery disease. He was transferred to [facility] for further management of his cholecystitis and plan for potential CABG when fully stabilized. He did rule in for NSTEMI. He is a smoker x 48 years and uses ETOH , admitting to at least 1 case of beer weekly.    Current Activity/ Functional Status: Patient is independent with mobility/ambulation, transfers, ADL's, IADL's.   Zubrod Score: At the time of surgery this patient's most appropriate activity status/level should be described as: _0     0    Normal activity, no symptoms _1     1    Restricted in physical strenuous activity but ambulatory, able to do out light work _2     2    Ambulatory and capable of self care, unable to do work activities, up and about                 more than 50%  Of the time                            _3     3    Only limited self care, in bed greater than 50% of waking hours _4     4    Completely disabled, no self care, confined to bed or chair _5     5    Moribund  Past Medical History:  Diagnosis Date  . Acute renal failure ([place]) 11/06/2015   [name] 11/06/2015  . Anxiety   . Arthritis    "lower spine; fingers" (11/07/2015)  .  Depression   . GERD (gastroesophageal reflux disease)   . High cholesterol   . Hypertension 10/17/2013  . Hyperthyroidism    "had it radiated in his '3s"  . Hypothyroidism   . New onset atrial fibrillation ([place]) 11/06/2015   [name] 11/07/2015  . NSTEMI (non-ST elevated myocardial infarction) ([place]) 11/07/2015  . Thyroid disease   . Type II diabetes mellitus ([place])     Past Surgical History:  Procedure Laterality Date  . CARDIAC CATHETERIZATION Right 11/06/2015   Procedure: Left Heart Cath and Coronary Angiography;  Surgeon: Minna Merritts, MD;  Location: [facility];  Service: Cardiovascular;  Laterality: Right;  . COLONOSCOPY    . FINGER GANGLION CYST EXCISION Left X 3   "index finger X 2; thumb X 1"  . [place]  . HERNIA REPAIR    . INSERTION  OF MESH N/A 11/16/2013   Procedure: INSERTION OF MESH;  Surgeon: Imogene Burn. Georgette Dover, MD;  Location: [facility];  Service: General;  Laterality: N/A;  . UMBILICAL HERNIA REPAIR N/A 11/16/2013   Procedure: UMBILICAL HERNIA REPAIR;  Surgeon: Imogene Burn. Georgette Dover, MD;  Location: [facility];  Service: General;  Laterality: N/A;    History  Smoking Status  . Current Every Day Smoker  . Packs/day: 0.12  . Years: 48.00  . Types: Cigarettes  Smokeless Tobacco  . Never Used    Comment: 11/07/2015 "I've never inhaled"    History  Alcohol Use  . 34.2 oz/week  . 36 Cans of beer, 21 Shots of liquor per week    Comment: 8/17.2017 "last drink was 11/03/2015 which I threw out"    Social History   Social History  . Marital status: Married    Spouse name: N/A  . Number of children: 2  . Years of education: N/A   Occupational History  . Product Engineer-terminated, Engineer, manufacturing systems    Social History Main Topics  . Smoking status: Current Every Day Smoker    Packs/day: 0.12    Years: 48.00    Types: Cigarettes  . Smokeless tobacco: Never Used     Comment: 11/07/2015 "I've never inhaled"  . Alcohol use 34.2 oz/week    36 Cans of beer, 21 Shots of liquor per week     Comment: 8/17.2017 "last drink was 11/03/2015 which I threw out"  . Drug use: No  . Sexual activity: Yes   Other Topics Concern  . Not on file   Social History Narrative  . No narrative on file    Allergies  Allergen Reactions  . Bupropion Hcl     REACTION: Severe constipation, insomnia  . Penicillins Other (See Comments)    REACTION: Vomiting Has patient had a PCN reaction causing immediate rash, facial/tongue/throat swelling, SOB or lightheadedness with hypotension: Yes Has patient had a PCN reaction causing severe rash involving mucus membranes or skin necrosis: No Has patient had a PCN reaction that required hospitalization No Has patient had a PCN reaction occurring within the last 10 years: No If all of  the above answers are "NO", then may proceed with Cephalosporin use.    Current Facility-Administered Medications  Medication Dose Route Frequency Provider Last Rate Last Dose  . acetaminophen (TYLENOL) tablet 650 mg  650 mg Oral Q4H PRN Erma Heritage, [place]   650 mg at 11/07/15 2026  . amiodarone (PACERONE) tablet 200 mg  200 mg Oral BID Erma Heritage, [place]   200 mg at 11/07/15 2144  .  aspirin EC tablet 81 mg  81 mg Oral Daily [place], [place]   81 mg at 11/07/15 1826  . atorvastatin (LIPITOR) tablet 40 mg  40 mg Oral q1800 Erma Heritage, [place]   40 mg at 11/07/15 1826  . escitalopram (LEXAPRO) tablet 10 mg  10 mg Oral Daily [place], [place]      . TPN (CLINIMIX-E) Adult   Intravenous Continuous TPN Donalynn Furlong [place], [facility]       And  . fat emulsion 20 % infusion 240 mL  240 mL Intravenous Continuous TPN [place], [facility]      . folic acid (FOLVITE) tablet 1 mg  1 mg Oral Daily Lamar Sprinkles, MD   1 mg at 11/07/15 2258  . heparin ADULT infusion 100 units/mL (25000 units/218m sodium chloride 0.45%)  1,950 Units/hr Intravenous Continuous TTyrone Apple RPH 19.5 mL/hr at 11/07/15 1826 1,950 Units/hr at 11/07/15 1826  . insulin aspart (novoLOG) injection 0-9 Units  0-9 Units Subcutaneous TID WC [facility]  1 Units at 11/08/15 00076 . insulin aspart (novoLOG) injection 0-9 Units  0-9 Units Subcutaneous Q4H JDonalynn FurlongDMullinville RPH      . levalbuterol ([facility] nebulizer solution 1.25 mg  1.25 mg Nebulization Q6H PIvin Poot MD      . [Derrill MemoON 11/09/2015] levothyroxine (SYNTHROID, LEVOTHROID) tablet 100 mcg  100 mcg Oral QAC breakfast BErma Heritage [place]     . LORazepam (ATIVAN) tablet 1 mg  1 mg Oral Q6H PRN DLamar Sprinkles MD       Or  . LORazepam (ATIVAN) injection 1 mg  1 mg Intravenous Q6H PRN DLamar Sprinkles MD      . metoprolol (LOPRESSOR) tablet 50 mg  50 mg Oral BID KEileen Stanford PA-C      . mometasone-formoterol ([facility] 200-5 MCG/ACT  inhaler 2 puff  2 puff Inhalation BID PIvin Poot MD      . nitroGLYCERIN (NITROSTAT) SL tablet 0.4 mg  0.4 mg Sublingual Q5 Min x 3 PRN BErma Heritage [place]     . ondansetron ([facility] injection 4 mg  4 mg Intravenous Q6H PRN BErma Heritage [place]     . pantoprazole (PROTONIX) EC tablet 40 mg  40 mg Oral Daily [place]     . piperacillin-tazobactam (ZOSYN) IVPB 3.375 g  3.375 g Intravenous Q8H TTroy Sine MD   3.375 g at 11/08/15 0242  . potassium chloride SA (K-DUR,KLOR-CON) CR tablet 20 mEq  20 mEq Oral BID BErma Heritage [place]  20 mEq at 11/07/15 2145  . senna (SENOKOT) tablet 8.6 mg  1 tablet Oral QHS PRN BErma Heritage PA      . thiamine (VITAMIN B-1) tablet 100 mg  100 mg Oral Daily DLamar Sprinkles MD   100 mg at 11/07/15 2258   Or  . thiamine (B-1) injection 100 mg  100 mg Intravenous Daily DLamar Sprinkles MD        Prescriptions Prior to Admission  Medication Sig Dispense Refill Last Dose  . ALPRAZolam (XANAX) 0.25 MG tablet Take 1 tablet (0.25 mg total) by mouth 2 (two) times daily as needed. (Patient taking differently: Take 0.25 mg by mouth 2 (two) times daily as needed for anxiety. ) 30 tablet 0 11/07/2015 at Unknown time  . levothyroxine (SYNTHROID, LEVOTHROID) 100 MCG tablet Take 1 tablet (100 mcg total) by mouth daily before breakfast.  OFFICE VISIT WITH  LABS REQUIRED FOR ADDITIONAL REFILLS 30 tablet 0 11/07/2015 at Unknown time  . losartan-hydrochlorothiazide (HYZAAR) 100-25 MG tablet Take 1 tablet by mouth daily.   11/07/2015 at Unknown time  . metFORMIN (GLUCOPHAGE) 500 MG tablet Take 500 mg by mouth 2 (two) times daily with a meal.   11/07/2015 at Unknown time  . omeprazole (PRILOSEC) 20 MG capsule Take 1 capsule (20 mg total) by mouth daily. OFFICE VISIT REQUIRED FOR ADDITIONAL REFILLS 30 capsule 0 11/07/2015 at Unknown time  . Probiotic Product (PROBIOTIC PO) Take by mouth.   11/07/2015 at Unknown time  . simvastatin (ZOCOR) 40 MG tablet Take  1 tablet (40 mg total) by mouth at bedtime. 90 tablet 0 11/07/2015 at Unknown time  . amiodarone (PACERONE) 400 MG tablet Take 1 tablet (400 mg total) by mouth 2 (two) times daily. (Patient not taking: Reported on 11/07/2015) 30 tablet 0 Not Taking at Unknown time  . aspirin 81 MG chewable tablet Chew 1 tablet (81 mg total) by mouth before cath procedure. (Patient not taking: Reported on 11/07/2015) 30 tablet 0 Not Taking at Unknown time  . diltiazem (CARDIZEM) 30 MG tablet Take 1 tablet (30 mg total) by mouth 3 (three) times daily. (Patient not taking: Reported on 11/07/2015) 120 tablet 0 Not Taking at Unknown time  . escitalopram (LEXAPRO) 10 MG tablet Take 1 tablet (10 mg total) by mouth daily. (Patient not taking: Reported on 11/07/2015) 30 tablet 0 Not Taking at Unknown time  . metoprolol tartrate (LOPRESSOR) 25 MG tablet Take 1 tablet (25 mg total) by mouth 2 (two) times daily. (Patient not taking: Reported on 11/07/2015) 30 tablet 0 Not Taking at Unknown time  . piperacillin-tazobactam (ZOSYN) 3.375 GM/50ML IVPB Inject 50 mLs (3.375 g total) into the vein every 8 (eight) hours. (Patient not taking: Reported on 11/07/2015) 50 mL 0 Not Taking at Unknown time  . temazepam (RESTORIL) 15 MG capsule Take 1 capsule (15 mg total) by mouth at bedtime as needed for sleep. (Patient not taking: Reported on 11/07/2015) 30 capsule 0 Not Taking at Unknown time    Family History  Problem Relation Age of Onset  . Cancer Mother     colon  . Hypertension Mother   . Diabetes Mother   . Depression Mother   . Colon cancer Mother   . Hyperthyroidism Father   . Hypertension Father   . Cancer Father     prostate  . Diabetes Maternal Grandmother   . Cancer Maternal Grandmother     lung, non smoker     Review of Systems:  Pertinent items are noted in HPI.     Cardiac Review of Systems: Y or N  Chest Pain [  n  ]  Resting SOB [ n  ] Exertional SOB  [ y ]  Orthopnea [ n ]   Pedal Edema [ y  ]    Palpitations [ n  ] Syncope  [ n ]   Presyncope [  n ]  General Review of Systems: [Y] = yes [  ]=no Constitional: recent weight change [n  ]; anorexia [ n ]; fatigue Blue.Reese  ]; nausea [ n ]; night sweats [ n ]; fever [ n ]; or chills [ n ]  Dental: poor dentition[  ]; Last Dentist visit:   Eye : blurred vision [  n]; diplopia [   n]; vision changes [  n];  Amaurosis fugax[n]; Resp: cough [n  ];  wheezing[  n];  hemoptysis[  n]; shortness of breath[ y ]; paroxysmal nocturnal dyspnea[n  ]; dyspnea on exertion[  n]; or orthopnea[ n ];  GI:  gallstones[ y ], vomiting[ n ];  dysphagia[n  ]; melena[ n ];  hematochezia [ n ]; heartburnn[  ];   Hx of  Colonoscopy[  ]; GU: kidney stones [  ]; hematuria[n  ];   dysuria [  ];  nocturia[  ];  history of     obstruction [  ]; urinary frequency [ n ]             Skin: rash, swelling[ n ];, hair loss[  ];  peripheral edema[y  ];  or itching[  ]; Musculosketetal: myalgias[ n ];  joint swelling[n  ];  joint erythema[n  ];  joint pain[n  ];  back pain[  n];  Heme/Lymph: bruising[ n ];  bleeding[n  ];  anemia[ y ];  Neuro: TIA[ n ];  headaches[ n ];  stroke[n  ];  vertigo[ n ];  seizures[n  ];   paresthesias[n  ];  difficulty walking[ n ];  Psych:depression[y  ]; anxiety[y  ];  Endocrine: diabetes[ y ];  thyroid dysfunction[ y ];  Immunizations: Flu [  ]; Pneumococcal[  ];  Other:  Physical Exam: BP (!) 153/62 (BP Location: Left Arm)   Pulse 72   Temp 98.5 F (36.9 C) (Oral)   Resp 20   Ht _0  (1.702 m)   Wt 216 lb 8 oz (98.2 kg)   SpO2 96%   BMI 33.91 kg/m    General appearance: alert, cooperative and no distress Head: Normocephalic, without obvious abnormality, atraumatic Neck: no adenopathy, no carotid bruit, no JVD, supple, symmetrical, trachea midline and thyroid not enlarged, symmetric, no tenderness/mass/nodules Lymph nodes: Cervical, supraclavicular, and axillary nodes normal. Resp: coarse  throughout Back: symmetric, no curvature. ROM normal. No CVA tenderness. Cardio: regular rate and rhythm, S1, S2 normal, no murmur, click, rub or gallop GI: + RUQ TTP, morbidly obese Genitalia: defer exam Extremities: pulses intact, + edema LE's Neurologic: Grossly normal  Diagnostic Studies & Laboratory data:     Recent Radiology Findings:   Dg Chest 2 View  Result Date: 11/08/2015 CLINICAL DATA:  Shortness of breath. Abdominal pain. Gallbladder and heart complications. EXAM: CHEST  2 VIEW COMPARISON:  11/10/2013 FINDINGS: Mild cardiac enlargement with central pulmonary vascular congestion. Mild interstitial pattern to the lungs likely represents early interstitial edema. Changes are new since prior study. Small bilateral pleural effusions. No pneumothorax. Calcified aorta. IMPRESSION: Cardiac enlargement with mild central pulmonary vascular congestion, interstitial edema, and small pleural effusions. Electronically Signed   By: Lucienne Capers M.D.   On: 11/08/2015 01:46   Nm Myocar Multi W/spect W/wall Motion / Ef  Result Date: 11/06/2015 Pharmacological myocardial perfusion imaging study with moderate sized region of moderate intensity ischemia in the lateral wall Normal wall motion, EF estimated at 68% No EKG changes concerning for ischemia at peak stress or in recovery. High risk scan given lateral wall ischemia, given recent troponin elevation Signed, Esmond Plants, MD, Ph.D [facility]     I have independently reviewed the above radiologic studies.  Recent Lab Findings: Lab Results  Component Value Date   WBC 13.0 (H) 11/08/2015   HGB  10.6 (L) 11/08/2015   HCT 30.6 (L) 11/08/2015   PLT 176 11/08/2015   GLUCOSE 110 (H) 11/08/2015   CHOL 150 10/30/2014   TRIG 99.0 10/30/2014   HDL 42.30 10/30/2014   LDLDIRECT 140.6 02/10/2012   LDLCALC 88 10/30/2014   ALT 33 11/08/2015   AST 26 11/08/2015   NA 129 (L) 11/08/2015   K 3.6 11/08/2015   CL 99 (L) 11/08/2015   CREATININE  1.29 (H) 11/08/2015   BUN 11 11/08/2015   CO2 21 (L) 11/08/2015   TSH 4.319 11/04/2015   INR 1.15 11/08/2015   HGBA1C 6.4 (H) 11/04/2015   Physicians   Panel Physicians Referring Physician Case Authorizing Physician  Minna Merritts, MD (Primary)  Minna Merritts, MD  Procedures   Left Heart Cath and Coronary Angiography  Conclusion    Dist RCA lesion, 95 %stenosed.  Ramus lesion, 90 %stenosed.  Ost Cx lesion, 95 %stenosed.  Mid LAD lesion, 70 %stenosed.  Dist LAD lesion, 60 %stenosed.  Ost LM to LM lesion, 40 %stenosed.    Procedural Details/Technique   Technical Details Cardiac Catheterization Procedure Note  Name: TERREZ ANDER MRN: [id number] DOB: 02/03/1951  Procedure: Left Heart Cath, Selective Coronary Angiography, LV angiography  Indication: 65 year old gentleman presenting with right upper quadrant pain, acute cholecystitis, rapid atrial fibrillation with rate 180 bpm in the setting of severe pain, elevated troponin, CT scan of the abdomen showing severe aortic and coronary disease, stress test showing lateral wall ischemia.   Procedural details: The right groin was prepped, draped, and anesthetized with 1% lidocaine. Using modified Seldinger technique, a 5 French sheath was introduced into the right femoral artery. Standard Judkins catheters (JL 5, JR 5 (multipurpose, AL1, 3drc, ARmod) were used for coronary angiography and left ventriculography. Catheter exchanges were performed over a guidewire. There were no immediate procedural complications. The patient was transferred to the post catheterization recovery area for further monitoring.  Moderate sedation:  I was Face to Face with the patient during this time: (code: [phone])   Procedural Findings:   Coronary angiography:  Coronary dominance: Right  Left mainstem: Large vessel that bifurcates into the LAD and left circumflex, 40% distal left main disease  Left anterior descending (LAD): Large vessel  that extends to the apical region, diagonal branch 2 of moderate size, 70% proximal LAD disease prior to a septal branch  Left circumflex (LCx): Large vessel with OM branch 2, critical 90% ostial disease  Ramus/high OM: Moderate size vessel, Critical ostial disease estimated at 90%  Right coronary artery (RCA): Right dominant vessel with PL and PDA, severe/critical mid to distal disease estimated at 90-95%, also with suspected 60-70% proximal disease of a tortuous bend, at least 40-50% distal disease prior to the bifurcation  Left ventriculography: LV gram not performed given the contrast load  Total contrast 210 cc secondary to tremendous challenge engaging the ostium of the RCA. Dr Ellyn Hack was contacted and assisted in the case, finally obtaining adequate visualization of the RCA. Takeoff was very high anterior  Final Conclusions:  Four-vessel disease Moderate to severe proximal LAD disease, Critical ostial left circumflex and ramus disease Critical mid to distal RCA disease  Recommendations:  Discussed case with family and with Dr. Ellyn Hack, Recommendation made for CABG patient has indicated they would like to go to [facility] 11/06/2015, 5:10 PM   Estimated blood loss <50 mL. . During this procedure the patient was administered the following to achieve and maintain moderate conscious  sedation: Versed 1 mg, while the patient's heart rate, blood pressure, and oxygen saturation were continuously monitored. The period of conscious sedation was 61 minutes, of which I was present face-to-face 100% of this time.    Coronary Findings   Dominance: Co-dominant  Left Main  Ost LM to LM lesion, 40% stenosed.  Left Anterior Descending  Mid LAD lesion, 70% stenosed.  Dist LAD lesion, 60% stenosed.  Ramus Intermedius  Ramus lesion, 90% stenosed.  Left Circumflex  Ost Cx lesion, 95% stenosed.  Right Coronary Artery  Dist RCA lesion, 95% stenosed.  Coronary Diagrams    Diagnostic Diagram     Implants     Vascular Products  Device Closure Mynxgrip [id number] Implanted    Inventory item: Device Closure Mynxgrip 585fodel/Cat number: [id number]: [employer] Lot number: [id number] identifier: [id number] identifier type: HIBC  Area Of Implantation: Groin    GUDID Information   Request status Request failed - device not in GUDID    As of 11/06/2015   Status: Implanted      PACS Images   Show images for Cardiac catheterization   Link to Procedure Log   Procedure Log    Hemo Data   AO Systolic Cath Pressure AO Diastolic Cath Pressure AO Mean Cath Pressure LV Systolic Cath Pressure LV End Diastolic  103810 mmHg 64 mmHg -- --  118 60 mmHg 73 mmHg -- --  118 59 mmHg 80 mmHg -- --  117 56 mmHg 79 mmHg -- --  118 61 mmHg 79 mmHg -- --  127 57 mmHg 79 mmHg -- --  124 62 mmHg 79 mmHg -- --  125 63 mmHg 86 mmHg -- --  129 64 mmHg 88 mmHg -- --  -- -- -- 127 mmHg 11 mmHg  -- -- -- 127 mmHg 15 mmHg  123 65 mmHg 84 mmHg -- --  125 65 mmHg 89 mmHg -- --  125 62 mmHg 85 mmHg -- --       ECHO: Result status: Final result                   *[facility]                      [place] [id number]                          [phone]------------------------------------------------------------------- Transthoracic Echocardiography  Patient:    SiNyzaiah, KaiR #:       [id number] Date: 11/04/2015 Gender:     M Age:        [age]:     170.2 cm Weight:     94.1 kg BSA:        2.14 m^2 Pt. Status: Room:       234A   ADMITTING    Mody, Sital P  ORDERING     Mody, Sital P  REFERRING    Mody, Sital P  ATTENDING    VaVaughan Bastad  SONOGRAPHER  [facility]   [facility]  cc:  ------------------------------------------------------------------- LV EF: 50% -    55%  ------------------------------------------------------------------- Indications:      Atrial fibrillation -  currently SR 427.31.  ------------------------------------------------------------------- History:   Risk factors:  Current tobacco use. Hypertension. Diabetes mellitus. Dyslipidemia.  ------------------------------------------------------------------- Study Conclusions  - Left ventricle: The cavity size was normal. Systolic function was   normal. The estimated ejection fraction was in the range of 50%   to 55%. Wall motion was normal; there were no regional wall   motion abnormalities. Features are consistent with a pseudonormal   left ventricular filling pattern, with concomitant abnormal   relaxation and increased filling pressure (grade 2 diastolic   dysfunction). - Left atrium: The atrium was at the upper limits of normal in   size. - Right ventricle: Systolic function was normal. - Pulmonary arteries: Systolic pressure was within the normal   range.  Impressions:  - Rhythm is normal sinus.  ------------------------------------------------------------------- Study data:   Study status:  Routine.  Procedure:  Transthoracic echocardiography. Image quality was adequate. Transthoracic echocardiography.  M-mode, complete 2D, spectral Doppler, and color Doppler.  Birthdate:  Patient birthdate: 26-Aug-1950.  Age:  Patient is 65 yr old.  Sex:  Gender: male. BMI: 32.5 kg/m^2.  Patient status:  Inpatient.  Study date:  Study date: 11/04/2015. Study time: 05:44 PM.  -------------------------------------------------------------------  ------------------------------------------------------------------- Left ventricle:  The cavity size was normal. Systolic function was normal. The estimated ejection fraction was in the range of 50% to 55%. Wall motion was normal; there were no regional wall motion abnormalities. The transmitral flow pattern was normal.  The deceleration time of the early transmitral flow velocity was normal. The pulmonary vein flow pattern was normal. The tissue Doppler parameters were normal. Features are consistent with a pseudonormal left ventricular filling pattern, with concomitant abnormal relaxation and increased filling pressure (grade 2 diastolic dysfunction).  ------------------------------------------------------------------- Aortic valve:   Trileaflet; normal thickness, mildly calcified leaflets. Mobility was not restricted.  Doppler:  Transvalvular velocity was within the normal range. There was no stenosis. There was no regurgitation.  ------------------------------------------------------------------- Aorta:  Aortic root: The aortic root was mildly dilated.  ------------------------------------------------------------------- Mitral valve:   Structurally normal valve.   Mobility was not restricted.  Doppler:  Transvalvular velocity was within the normal range. There was no evidence for stenosis. There was no regurgitation.    Peak gradient (D): 4 mm Hg.  ------------------------------------------------------------------- Left atrium:  The atrium was at the upper limits of normal in size.   ------------------------------------------------------------------- Right ventricle:  The cavity size was normal. Wall thickness was normal. Systolic function was normal.  ------------------------------------------------------------------- Pulmonic valve:    Doppler:  Transvalvular velocity was within the normal range. There was no evidence for stenosis.  ------------------------------------------------------------------- Tricuspid valve:   Structurally normal valve.    Doppler: Transvalvular velocity was within the normal range. There was trivial regurgitation.  ------------------------------------------------------------------- Pulmonary artery:   The main pulmonary artery was normal-sized. Systolic  pressure was within the normal range.  ------------------------------------------------------------------- Right atrium:  The atrium was normal in size.  ------------------------------------------------------------------- Pericardium:  There was no pericardial effusion.  ------------------------------------------------------------------- Systemic veins: Inferior vena cava: The vessel was normal in size. The respirophasic diameter changes were in the normal range (>= 50%), consistent with normal central venous pressure.  ------------------------------------------------------------------- Measurements   Left ventricle                         Value        Reference  LV ID, ED, PLAX chordal                46.9  mm  43 - 52  LV ID, ES, PLAX chordal                28.8  mm     23 - 38  LV fx shortening, PLAX chordal         39    %      >=29  LV PW thickness, ED                    9.98  mm     ---------  IVS/LV PW ratio, ED                    0.98         <=1.3  LV e&', lateral                         8.49  cm/s   ---------  LV E/e&', lateral                       12.01        ---------  LV e&', medial                          4.57  cm/s   ---------  LV E/e&', medial                        22.32        ---------  LV e&', average                         6.53  cm/s   ---------  LV E/e&', average                       15.62        ---------    Ventricular septum                     Value        Reference  IVS thickness, ED                      9.79  mm     ---------    Aorta                                  Value        Reference  Aortic root ID, ED                     37    mm     ---------    Left atrium                            Value        Reference  LA ID, A-P, ES                         43    mm     ---------  LA ID/bsa, A-P                         2.01  cm/m^2 <=2.2  LA volume, S  46.5  ml     ---------  LA volume/bsa, S                        21.7  ml/m^2 ---------  LA volume, ES, 1-p A4C                 32.2  ml     ---------  LA volume/bsa, ES, 1-p A4C             15    ml/m^2 ---------  LA volume, ES, 1-p A2C                 54.1  ml     ---------  LA volume/bsa, ES, 1-p A2C             25.3  ml/m^2 ---------    Mitral valve                           Value        Reference  Mitral E-wave peak velocity            102   cm/s   ---------  Mitral A-wave peak velocity            86.1  cm/s   ---------  Mitral deceleration time       (H)     250   ms     150 - 230  Mitral peak gradient, D                4     mm Hg  ---------  Mitral E/A ratio, peak                 1.2          ---------    Systemic veins                         Value        Reference  Estimated CVP                          5     mm Hg  ---------    Right ventricle                        Value        Reference  RV ID, ED, PLAX                (H)     41.7  mm     19 - 38  RV s&', lateral, S                      12.8  cm/s   ---------  Legend: (L)  and  (H)  mark values outside specified reference range.  ------------------------------------------------------------------- Prepared and Electronically Authenticated by  Esmond Plants, MD, [facility] 2017-08-15T07:37:50      CT CHEST: CLINICAL DATA:  Right lower quadrant pain extending into the right upper and central abdomen. Symptoms for 2 days. History of hernia repair.  EXAM: CT ABDOMEN AND PELVIS WITHOUT CONTRAST  TECHNIQUE: Multidetector CT imaging of the abdomen and pelvis was performed following the standard protocol without IV contrast.  COMPARISON:  None.  FINDINGS: Lower chest: Mild dependent airspace opacities at both lung bases, probably atelectasis. No significant  pleural or pericardial effusion. Prominent coronary artery atherosclerosis noted. There is a small hiatal hernia.  Hepatobiliary: The hepatic density is diffusely decreased, consistent with steatosis. No focal hepatic  abnormalities are identified on noncontrast imaging. The gallbladder is distended with wall thickening and prominent surrounding inflammatory changes. No calcified gallstones are seen. There is no significant biliary dilatation.  Pancreas: Unremarkable. No pancreatic ductal dilatation or surrounding inflammatory changes.  Spleen: Normal in size without focal abnormality.  Adrenals/Urinary Tract: Both adrenal glands appear normal. Both kidneys demonstrate mild cortical thinning and symmetric perinephric soft tissue stranding. 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 No evidence of renal mass, hydronephrosis or urinary tract calculus. The bladder appears unremarkable.  Stomach/Bowel: No evidence bowel wall thickening or distention. The right upper quadrant inflammatory changes do not appear to arise from the duodenum. The appendix appears normal. There is mild distal colonic diverticulosis.  Vascular/Lymphatic: There are no enlarged abdominal or pelvic lymph nodes. Aortic and branch vessel atherosclerosis noted. Vascular assessment limited without contrast.  Reproductive: There is a focal parenchymal calcification within the right aspect of the prostate gland which is not significantly enlarged. The seminal vesicles appear normal.  Other: No evidence of abdominal wall mass or hernia.  Musculoskeletal: No acute or significant osseous findings.  IMPRESSION: 1. Distended gallbladder with wall thickening and surrounding inflammation highly suspicious for acute cholecystitis. Correlate clinically. No calcified gallstones or biliary dilatation identified. 2. No other acute findings are seen.  The appendix appears normal. 3. Bibasilar atelectasis, hepatic steatosis and mild distal colonic diverticulosis. 4.  Aortic Atherosclerosis (ICD10-170.0)   Electronically Signed   By: Richardean Sale M.D.   On: 11/04/2015 10:17   Assessment /  Plan:  Severe 3 vessel CAD                                     Acalculous cholecystitis    Patient Active Problem List   Diagnosis Date Noted  . Moderate major depression, single episode ([place]) 11/06/2015  . Generalized anxiety disorder 11/06/2015  . Pre-op evaluation   . Positive cardiac stress test   . NSTEMI (non-ST elevated myocardial infarction) ([place])   . Coronary artery disease involving native coronary artery of native heart with angina pectoris ([place])   . Cholecystitis 11/04/2015  . PAF (paroxysmal atrial fibrillation) ([place]) 11/04/2015  . Acute renal failure (ARF) ([place])   . Elevated troponin I level   . Right upper quadrant pain   . Coronary artery disease involving native coronary artery of native heart with angina pectoris with documented spasm ([place])   . Aortic atherosclerosis ([place])   . Visit for well man health check 11/06/2014  . Counseling on health promotion and disease prevention 03/29/2014  . Umbilical hernia 09/73/5329  . 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 Essential hypertension 10/17/2013  . Weight gain 10/11/2013  . Swelling of right knee joint 10/11/2013  . Puncture wound 08/18/2013  . Left low back pain 04/13/2013  . HLD (hyperlipidemia) 01/26/2011  . Anxiety state 12/26/2008  . Hypothyroidism 03/02/2007  . Diabetes ([place]) 03/02/2007  . NICOTINE ADDICTION 03/02/2007  . DEPRESSION/ ANGER CONTROL 03/02/2007     Plan : for CABG when medically stable    GOLD,WAYNE E, PA-C   Patient examined, cath and echocardiogram, CT abdomen an CXR all personally reviewed and counseled with patient  He presented with symptoms of acalculous cholecystitis, afib, nonstemi  with WBC 24k   He was planned for cholecystectomy but cardiac clearance with myoview scan and cath show critical 3 v CAD. He is improving with iv Zosyn but remains with RUQ guarding- tenderness. His abd pain becomes severe with any po meal and he has not had adequate nutrition for a week  PLAN CABG next week  after GB is drained, his abdominal tenderness resolves and his nutritional status improved with TNA.  Patient also has high grade L carotid stenosis and [facility] consult is pendimng

## 2015-11-08 NOTE — Progress Notes (Signed)
Initial Nutrition Assessment  DOCUMENTATION CODES:   Obesity unspecified  INTERVENTION:    TPN per pharmacy   Consider nasojejunal feeding tube placement (CORTRAK) and transition to enteral nutrition support  NUTRITION DIAGNOSIS:   Inadequate oral intake related to inability to eat as evidenced by NPO status  GOAL:   Patient will meet greater than or equal to 90% of their needs  MONITOR:   Diet advancement, PO intake, Labs, Weight trends, I & O's, TPN prescription   REASON FOR ASSESSMENT:   Consult New TPN/TNA  ASSESSMENT:   65 y.o. Male with h/o DM2, HTN, HLD, hypothyroidisim, and chronic alcohol abuse presented to [facility] on 11/04/15 with RUQ abdominal and was found to have acute cholecystitis on CT scan as well as new onset Afib with RVR with heart rates in the 180's bpm with troponin elevation of 0.27-->0.04 without chest pain.    RD unable to obtain nutrition hx >> pt having PICC line placed. Pt with some RUQ pain and decreased PO intake PTA >> acute cholecystitis. Cardiac cath performed at [facility] and showed severe 3-vessel CAD. Pt transferred to [facility] for evaluation by [facility].  Patient to receive TPN with Clinimix E 5/15 @ 40 ml/hr and lipids @ 10 ml/hr.  Provides 1162 kcal and 48 grams protein per day.  Meets 53% minimum estimated energy needs and 40% minimum estimated protein needs.  Diet Order:  Diet NPO time specified Except for: Sips with Meds TPN (CLINIMIX-E) Adult  Skin:  Reviewed, no issues  Last BM:  8/18  Height:   Ht Readings from Last 1 Encounters:  11/07/15 5' 7" (1.702 m)    Weight:   Wt Readings from Last 1 Encounters:  11/08/15 216 lb 8 oz (98.2 kg)    Ideal Body Weight:  67.2 kg  BMI:  Body mass index is 33.91 kg/m.  Estimated Nutritional Needs:   Kcal:  2200-2400  Protein:  120-130 gm  Fluid:  2.2-2.4 L  EDUCATION NEEDS:   No education needs identified at this time  Arthur Holms, RD, LDN Pager #: [phone] After-Hours  Pager #: [phone]

## 2015-11-09 ENCOUNTER — Inpatient Hospital Stay (HOSPITAL_COMMUNITY): Payer: Managed Care, Other (non HMO)

## 2015-11-09 ENCOUNTER — Encounter (HOSPITAL_COMMUNITY): Payer: Self-pay

## 2015-11-09 DIAGNOSIS — I6522 Occlusion and stenosis of left carotid artery: Secondary | ICD-10-CM

## 2015-11-09 DIAGNOSIS — E785 Hyperlipidemia, unspecified: Secondary | ICD-10-CM

## 2015-11-09 DIAGNOSIS — F1010 Alcohol abuse, uncomplicated: Secondary | ICD-10-CM | POA: Diagnosis present

## 2015-11-09 DIAGNOSIS — I480 Paroxysmal atrial fibrillation: Secondary | ICD-10-CM

## 2015-11-09 DIAGNOSIS — I6529 Occlusion and stenosis of unspecified carotid artery: Secondary | ICD-10-CM | POA: Diagnosis present

## 2015-11-09 DIAGNOSIS — I519 Heart disease, unspecified: Secondary | ICD-10-CM | POA: Diagnosis present

## 2015-11-09 DIAGNOSIS — I214 Non-ST elevation (NSTEMI) myocardial infarction: Principal | ICD-10-CM

## 2015-11-09 DIAGNOSIS — I25110 Atherosclerotic heart disease of native coronary artery with unstable angina pectoris: Secondary | ICD-10-CM

## 2015-11-09 DIAGNOSIS — N182 Chronic kidney disease, stage 2 (mild): Secondary | ICD-10-CM | POA: Diagnosis present

## 2015-11-09 DIAGNOSIS — E43 Unspecified severe protein-calorie malnutrition: Secondary | ICD-10-CM | POA: Diagnosis present

## 2015-11-09 DIAGNOSIS — K819 Cholecystitis, unspecified: Secondary | ICD-10-CM

## 2015-11-09 DIAGNOSIS — A4150 Gram-negative sepsis, unspecified: Secondary | ICD-10-CM | POA: Diagnosis present

## 2015-11-09 DIAGNOSIS — I10 Essential (primary) hypertension: Secondary | ICD-10-CM

## 2015-11-09 LAB — CBC
HCT: 29.2 % — ABNORMAL LOW (ref 39.0–52.0)
Hemoglobin: 10.1 g/dL — ABNORMAL LOW (ref 13.0–17.0)
MCH: 33.7 pg (ref 26.0–34.0)
MCHC: 34.6 g/dL (ref 30.0–36.0)
MCV: 97.3 fL (ref 78.0–100.0)
Platelets: 174 10*3/uL (ref 150–400)
RBC: 3 MIL/uL — ABNORMAL LOW (ref 4.22–5.81)
RDW: 13.8 % (ref 11.5–15.5)
WBC: 15.3 10*3/uL — ABNORMAL HIGH (ref 4.0–10.5)

## 2015-11-09 LAB — HEPARIN LEVEL (UNFRACTIONATED): Heparin Unfractionated: 0.37 IU/mL (ref 0.30–0.70)

## 2015-11-09 LAB — LACTIC ACID, PLASMA: Lactic Acid, Venous: 1.1 mmol/L (ref 0.5–1.9)

## 2015-11-09 LAB — DIFFERENTIAL
Basophils Absolute: 0 10*3/uL (ref 0.0–0.1)
Basophils Relative: 0 %
Eosinophils Absolute: 0.1 10*3/uL (ref 0.0–0.7)
Eosinophils Relative: 0 %
Lymphocytes Relative: 3 %
Lymphs Abs: 0.4 10*3/uL — ABNORMAL LOW (ref 0.7–4.0)
Monocytes Absolute: 0.7 10*3/uL (ref 0.1–1.0)
Monocytes Relative: 5 %
Neutro Abs: 14.1 10*3/uL — ABNORMAL HIGH (ref 1.7–7.7)
Neutrophils Relative %: 92 %

## 2015-11-09 LAB — COMPREHENSIVE METABOLIC PANEL
ALT: 61 U/L (ref 17–63)
AST: 78 U/L — ABNORMAL HIGH (ref 15–41)
Albumin: 1.9 g/dL — ABNORMAL LOW (ref 3.5–5.0)
Alkaline Phosphatase: 137 U/L — ABNORMAL HIGH (ref 38–126)
Anion gap: 7 (ref 5–15)
BUN: 11 mg/dL (ref 6–20)
CO2: 20 mmol/L — ABNORMAL LOW (ref 22–32)
Calcium: 7.1 mg/dL — ABNORMAL LOW (ref 8.9–10.3)
Chloride: 100 mmol/L — ABNORMAL LOW (ref 101–111)
Creatinine, Ser: 1.15 mg/dL (ref 0.61–1.24)
GFR calc Af Amer: >60 mL/min (ref >60)
GFR calc non Af Amer: >60 mL/min (ref >60)
Glucose, Bld: 493 mg/dL — ABNORMAL HIGH (ref 65–99)
Potassium: 4.8 mmol/L (ref 3.5–5.1)
Sodium: 127 mmol/L — ABNORMAL LOW (ref 135–145)
Total Bilirubin: 0.9 mg/dL (ref 0.3–1.2)
Total Protein: 4.8 g/dL — ABNORMAL LOW (ref 6.5–8.1)

## 2015-11-09 LAB — TRIGLYCERIDES: Triglycerides: 437 mg/dL — ABNORMAL HIGH (ref <150)

## 2015-11-09 LAB — GLUCOSE, CAPILLARY
GLUCOSE-CAPILLARY: 210 mg/dL — AB (ref 65–99)
GLUCOSE-CAPILLARY: 176 mg/dL — AB (ref 65–99)
GLUCOSE-CAPILLARY: 219 mg/dL — AB (ref 65–99)
Glucose-Capillary: 184 mg/dL — ABNORMAL HIGH (ref 65–99)

## 2015-11-09 LAB — PREALBUMIN: Prealbumin: 10.1 mg/dL — ABNORMAL LOW (ref 18–38)

## 2015-11-09 LAB — LIPASE, BLOOD: Lipase: 23 U/L (ref 11–51)

## 2015-11-09 LAB — MAGNESIUM: Magnesium: 1.8 mg/dL (ref 1.7–2.4)

## 2015-11-09 LAB — PHOSPHORUS: Phosphorus: 4.2 mg/dL (ref 2.5–4.6)

## 2015-11-09 LAB — TSH: TSH: 4.905 u[IU]/mL — ABNORMAL HIGH (ref 0.350–4.500)

## 2015-11-09 MED ORDER — PROMETHAZINE HCL 25 MG/ML IJ SOLN: 25.0000 mg | Freq: Four times a day (QID) | INTRAMUSCULAR | Status: DC | PRN

## 2015-11-09 MED ORDER — MORPHINE SULFATE (PF) 2 MG/ML IV SOLN
1.0000 mg | INTRAVENOUS | Status: DC | PRN
  Administered 2015-11-09 – 2015-11-10 (×2): 2 mg via INTRAVENOUS
  Filled 2015-11-09 (×2): qty 1

## 2015-11-09 MED ORDER — INSULIN ASPART 100 UNIT/ML ~~LOC~~ SOLN
0.0000 [IU] | Freq: Every day | SUBCUTANEOUS | Status: DC
  Administered 2015-11-10: 2 [IU] via SUBCUTANEOUS

## 2015-11-09 MED ORDER — ACETAMINOPHEN 325 MG PO TABS: 650.0000 mg | ORAL_TABLET | ORAL | Status: DC | PRN

## 2015-11-09 MED ORDER — ENSURE ENLIVE PO LIQD
237.0000 mL | Freq: Two times a day (BID) | ORAL | Status: DC
  Administered 2015-11-10: 237 mL via ORAL

## 2015-11-09 MED ORDER — OXYCODONE HCL 5 MG PO TABS: 5.0000 mg | ORAL_TABLET | ORAL | Status: DC | PRN

## 2015-11-09 MED ORDER — INSULIN ASPART 100 UNIT/ML ~~LOC~~ SOLN
0.0000 [IU] | Freq: Three times a day (TID) | SUBCUTANEOUS | Status: DC
  Administered 2015-11-09: 3 [IU] via SUBCUTANEOUS
  Administered 2015-11-10 (×2): 5 [IU] via SUBCUTANEOUS

## 2015-11-09 MED ORDER — TRACE MINERALS CR-CU-MN-SE-ZN 10-1000-500-60 MCG/ML IV SOLN
INTRAVENOUS | Status: DC
  Administered 2015-11-09: 18:00:00 via INTRAVENOUS
  Filled 2015-11-09: qty 1992

## 2015-11-09 MED ORDER — VANCOMYCIN HCL 10 G IV SOLR
1250.0000 mg | Freq: Two times a day (BID) | INTRAVENOUS | Status: DC
  Administered 2015-11-09 – 2015-11-10 (×2): 1250 mg via INTRAVENOUS
  Filled 2015-11-09 (×3): qty 1250

## 2015-11-09 MED ORDER — ONDANSETRON HCL 4 MG/2ML IJ SOLN: 4.0000 mg | Freq: Four times a day (QID) | INTRAMUSCULAR | Status: DC | PRN

## 2015-11-09 NOTE — Consult Note (Addendum)
New Carotid Patient  Referred by:  Dr. 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 (1950-06-27) male who presents with chief complaint: right shoulder pain.  This patient was transferred from [facility] on 11/04/15.  Reportedly he was diagnosed with acute cholecystitis.  He underwent a nuclear stress test which was positive.  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  Pt was transferred to [facility] for CT Surgery evaluation and placement of cholecystostomy tube.  As part of his pre-op work-up, B carotid duplex were completed.  These demonstrated : RICA 39-43% stenosis, LICA 20-03% stenosis.  Patient has NO history of TIA or stroke symptom.  The patient has never had amaurosis fugax or monocular blindness.  The patient has never had facial drooping or hemiplegia.  The patient has never had receptive or expressive aphasia.   The patient's risks factors for carotid disease include: HTN, HLD, DM and active smoking.  The patient had his percutaneous cholecystostomy tube last night and spike a fever overnight with associated tachycardiac.  Pt currently c/o right shd pain since the cholecystostomy tube placement.  He denies any fever or chills currently and has no chest pain.  Past Medical History:  Diagnosis Date  . Acute renal failure ([place]) 11/06/2015   [name] 11/06/2015  . Anxiety   . Arthritis    "lower spine; fingers" (11/07/2015)  . Depression   . GERD (gastroesophageal reflux disease)   . High cholesterol   . Hypertension 10/17/2013  . Hyperthyroidism    "had it radiated in his '11s"  . Hypothyroidism   . New onset atrial fibrillation ([place]) 11/06/2015   [name] 11/07/2015  . NSTEMI (non-ST elevated myocardial infarction) ([place]) 11/07/2015  . Thyroid disease   . Type II diabetes mellitus ([place])     Past Surgical History:  Procedure Laterality Date  . CARDIAC CATHETERIZATION  Right 11/06/2015   Procedure: Left Heart Cath and Coronary Angiography;  Surgeon: Minna Merritts, MD;  Location: [place] [facility];  Service: Cardiovascular;  Laterality: Right;  . COLONOSCOPY    . FINGER GANGLION CYST EXCISION Left X 3   "index finger X 2; thumb X 1"  . [place]  . HERNIA REPAIR    . INSERTION OF MESH N/A 11/16/2013   Procedure: INSERTION OF MESH;  Surgeon: Imogene Burn. Georgette Dover, MD;  Location: [place];  Service: General;  Laterality: N/A;  . IR GENERIC HISTORICAL  11/08/2015   IR IMAGE GUIDED DRAINAGE BY PERCUTANEOUS CATHETER 11/08/2015 Corrie Mckusick, DO [facility]  . UMBILICAL HERNIA REPAIR N/A 11/16/2013   Procedure: UMBILICAL HERNIA REPAIR;  Surgeon: Imogene Burn. Georgette Dover, MD;  Location: [place];  Service: General;  Laterality: N/A;    Social History   Social History  . Marital status: Married    Spouse name: N/A  . Number of children: 2  . Years of education: N/A   Occupational History  . Product Engineer-terminated, Engineer, manufacturing systems    Social History Main Topics  . Smoking status: Current Every Day Smoker    Packs/day: 0.12    Years: 48.00    Types: Cigarettes  . Smokeless tobacco: Never Used     Comment: 11/07/2015 "I've never inhaled"  . Alcohol use 34.2 oz/week    36 Cans of beer, 21 Shots of liquor per week     Comment: 8/17.2017 "last drink was 11/03/2015 which  I threw out"  . Drug use: No  . Sexual activity: Yes   Other Topics Concern  . Not on file   Social History Narrative  . No narrative on file    Family History  Problem Relation Age of Onset  . Cancer Mother     colon  . Hypertension Mother   . Diabetes Mother   . Depression Mother   . Colon cancer Mother   . Hyperthyroidism Father   . Hypertension Father   . Cancer Father     prostate  . Diabetes Maternal Grandmother   . Cancer Maternal Grandmother     lung, non smoker    Current Facility-Administered Medications  Medication Dose Route  Frequency Provider Last Rate Last Dose  . Marland KitchenTPN (CLINIMIX-E) Adult   Intravenous Continuous TPN Donalynn Furlong [place], [facility]      . acetaminophen (TYLENOL) tablet 650 mg  650 mg Oral Q4H PRN Erma Heritage, [place]   650 mg at 11/08/15 2322  . amiodarone (PACERONE) tablet 200 mg  200 mg Oral BID Erma Heritage, [place]   200 mg at 11/08/15 2006  . aspirin EC tablet 81 mg  81 mg Oral Daily Erma Heritage, [place]   81 mg at 11/08/15 1100  . atorvastatin (LIPITOR) tablet 40 mg  40 mg Oral q1800 Erma Heritage, [place]   40 mg at 11/08/15 1917  . escitalopram (LEXAPRO) tablet 10 mg  10 mg Oral Daily Erma Heritage, [place]   10 mg at 11/08/15 1100  . folic acid (FOLVITE) tablet 1 mg  1 mg Oral Daily Lamar Sprinkles, MD   1 mg at 11/08/15 1100  . heparin ADULT infusion 100 units/mL (25000 units/249m sodium chloride 0.45%)  2,150 Units/hr Intravenous Continuous TTroy Sine MD 21.5 mL/hr at 11/08/15 2206 2,150 Units/hr at 11/08/15 2206  . insulin aspart (novoLOG) injection 0-9 Units  0-9 Units Subcutaneous Q4H [facility]  3 Units at 11/09/15 03536 . levalbuterol (XOPENEX) nebulizer solution 1.25 mg  1.25 mg Nebulization TID TTroy Sine MD   1.25 mg at 11/09/15 0825  . levothyroxine (SYNTHROID, LEVOTHROID) tablet 100 mcg  100 mcg Oral QAC breakfast BErma Heritage [place]  100 mcg at 11/09/15 01443 . LORazepam (ATIVAN) tablet 1 mg  1 mg Oral Q6H PRN DLamar Sprinkles MD       Or  . LORazepam (ATIVAN) injection 1 mg  1 mg Intravenous Q6H PRN DLamar Sprinkles MD      . magic mouthwash  10 mL Oral TID PIvin Poot MD   10 mL at 11/08/15 2324  . metoprolol (LOPRESSOR) tablet 50 mg  50 mg Oral BID KEileen Stanford PA-C   50 mg at 11/08/15 2005  . mometasone-formoterol (DULERA) 200-5 MCG/ACT inhaler 2 puff  2 puff Inhalation BID PIvin Poot MD   2 puff at 11/08/15 2105  . nitroGLYCERIN (NITROSTAT) SL tablet 0.4 mg  0.4 mg Sublingual Q5 Min x 3 PRN BErma Heritage [place]     . ondansetron  ([facility] injection 4 mg  4 mg Intravenous Q6H PRN BErma Heritage PA      . pantoprazole (PROTONIX) EC tablet 40 mg  40 mg Oral Daily BErma Heritage [place]  40 mg at 11/08/15 1100  . piperacillin-tazobactam (ZOSYN) IVPB 3.375 g  3.375 g Intravenous Q8H TTroy Sine MD   3.375 g at 11/09/15 0330  . potassium chloride SA (  K-DUR,KLOR-CON) CR tablet 20 mEq  20 mEq Oral BID Erma Heritage, [place]   20 mEq at 11/08/15 2323  . senna (SENOKOT) tablet 8.6 mg  1 tablet Oral QHS PRN Erma Heritage, PA      . sodium chloride flush (NS) 0.9 % injection 10-40 mL  10-40 mL Intracatheter PRN Troy Sine, MD      . thiamine (VITAMIN B-1) tablet 100 mg  100 mg Oral Daily Lamar Sprinkles, MD   100 mg at 11/08/15 1100   Or  . thiamine (B-1) injection 100 mg  100 mg Intravenous Daily Lamar Sprinkles, MD      . TPN (CLINIMIX-E) Adult   Intravenous Continuous TPN Alvira Philips, RPH 40 mL/hr at 11/08/15 2017      Allergies  Allergen Reactions  . Bupropion Hcl     REACTION: Severe constipation, insomnia  . Penicillins Other (See Comments)    REACTION: Vomiting Has patient had a PCN reaction causing immediate rash, facial/tongue/throat swelling, SOB or lightheadedness with hypotension: Yes Has patient had a PCN reaction causing severe rash involving mucus membranes or skin necrosis: No Has patient had a PCN reaction that required hospitalization No Has patient had a PCN reaction occurring within the last 10 years: No If all of the above answers are "NO", then may proceed with Cephalosporin use.     REVIEW OF SYSTEMS:  (Positives checked otherwise negative)  CARDIOVASCULAR:   _0  chest pain,  _1  chest pressure,  _2  palpitations,  _3  shortness of breath when laying flat,  _4  shortness of breath with exertion,   _5  pain in feet when walking,  _6  pain in feet when laying flat, _7  history of blood clot in veins (DVT),  _8  history of phlebitis,  _9  swelling in legs,  _10  varicose  veins  PULMONARY:   _11  productive cough,  _12  asthma,  _13  wheezing  NEUROLOGIC:   _14  weakness in arms or legs,  _15  numbness in arms or legs,  _16  difficulty speaking or slurred speech,  _17  temporary loss of vision in one eye,  _18  dizziness  HEMATOLOGIC:   _19  bleeding problems,  _20  problems with blood clotting too easily  MUSCULOSKEL:   _21  joint pain: shd pain, _22  joint swelling  GASTROINTEST:   _23  vomiting blood,  _24  blood in stool   _25  abd pain   GENITOURINARY:   _26  burning with urination,  _27  blood in urine  PSYCHIATRIC:   _28  history of major depression  INTEGUMENTARY:   _29  rashes,  _30  ulcers  CONSTITUTIONAL:   _31  fever,  _32  chills   For VQI Use Only  PRE-ADM LIVING: Home  AMB STATUS: Ambulatory  CAD Sx: None  PRIOR CHF: None  STRESS TEST: _33  No, _34  Normal, _35  + ischemia, _36  + MI, _37  Both   Physical Examination  Vitals:   11/09/15 0500 11/09/15 0600 11/09/15 0700 11/09/15 0825  BP: (!) 154/67 (!) 146/68 (!) 146/66   Pulse: 84 81 94   Resp: (!) 24 (!) 24 (!) 25   Temp:   98.5 F (36.9 C)   TempSrc:   Oral   SpO2: 95% 95% 94% 94%  Weight:      Height:       Body mass index is 34.05 kg/m.  General:  A&O x 3, WD, mildly obese  Head: [place]/AT  Ear/Nose/Throat: Hearing grossly intact, nares w/o erythema or drainage, oropharynx w/o Erythema/Exudate, dry mouth with some white discoloration, Mallampati score: 3  Eyes: PERRLA, EOMI  Neck: Supple, no nuchal rigidity, no palpable LAD  Pulmonary: Sym exp, good air movt, CTAB, no rales, rhonchi, & wheezing  Cardiac: RRR, Nl S1, S2, no Murmurs, rubs or gallops. Irregularly, irregular rhythm and rate,  Vascular: Vessel Right Left  Radial Palpable Palpable  Brachial Palpable Palpable  Carotid Palpable, without bruit Palpable, without bruit  Aorta  Not palpable N/A  Femoral Palpable Palpable  Popliteal Not palpable Not palpable  PT Faintly Palpable Faintly Palpable    DP Faintly Palpable Faintly Palpable   Gastrointestinal: soft, RUQ cholecystotomy tube, ND, -G/R, - HSM, - masses, - CVAT B  Musculoskeletal: M/S 5/5 throughout , Extremities without ischemic changes   Neurologic: CN 2-12 intact except unwilling to raise shoulders, Pain and light touch intact in extremities, Motor exam as listed above  Psychiatric: Judgment intact, Mood & affect appropriate for pt's clinical situation  Dermatologic: See M/S exam for extremity exam, no rashes otherwise noted  Lymph : No Cervical, Axillary, or Inguinal lymphadenopathy    Non-Invasive Vascular Imaging  CAROTID DUPLEX (Date: 11/08/15):   R ICA stenosis: 60-79%  R VA: patent and antegrade  L ICA stenosis: 80-99%  L VA: patent and antegrade  I reviewed and finalized the study.  Based on velocity criteria, the L ICA stenosis would be >80% but on power doppler the stenosis does not look that tight.   Laboratory: CBC:    Component Value Date/Time   WBC 15.3 (H) 11/09/2015 0450   RBC 3.00 (L) 11/09/2015 0450   HGB 10.1 (L) 11/09/2015 0450   HCT 29.2 (L) 11/09/2015 0450   PLT 174 11/09/2015 0450   MCV 97.3 11/09/2015 0450   MCH 33.7 11/09/2015 0450   MCHC 34.6 11/09/2015 0450   RDW 13.8 11/09/2015 0450   LYMPHSABS 0.4 (L) 11/09/2015 0450   MONOABS 0.7 11/09/2015 0450   EOSABS 0.1 11/09/2015 0450   BASOSABS 0.0 11/09/2015 0450    BMP:    Component Value Date/Time   NA 127 (L) 11/09/2015 0450   K 4.8 11/09/2015 0450   CL 100 (L) 11/09/2015 0450   CO2 20 (L) 11/09/2015 0450   GLUCOSE 493 (H) 11/09/2015 0450   BUN 11 11/09/2015 0450   CREATININE 1.15 11/09/2015 0450   CALCIUM 7.1 (L) 11/09/2015 0450   GFRNONAA >60 11/09/2015 0450   GFRAA >60 11/09/2015 0450    Coagulation: Lab Results  Component Value Date   INR 1.15 11/08/2015   INR 1.14 11/04/2015   No results found for: PTT  Lipids:    Component Value Date/Time   CHOL 150 10/30/2014 0812   TRIG 437 (H) 11/09/2015 0450    HDL 42.30 10/30/2014 0812   CHOLHDL 4 10/30/2014 0812   VLDL 19.8 10/30/2014 0812   LDLCALC 88 10/30/2014 0812   LDLDIRECT 140.6 02/10/2012 0848    Radiology: Dg Chest 2 View  Result Date: 11/08/2015 CLINICAL DATA:  Shortness of breath. Abdominal pain. Gallbladder and heart complications. EXAM: CHEST  2 VIEW COMPARISON:  11/10/2013 FINDINGS: Mild cardiac enlargement with central pulmonary vascular congestion. Mild interstitial pattern to the lungs likely represents early interstitial edema. Changes are new since prior study. Small bilateral pleural effusions. No pneumothorax. Calcified aorta. IMPRESSION: Cardiac enlargement with mild central pulmonary vascular congestion, interstitial edema, and small pleural effusions. Electronically Signed  By: Lucienne Capers M.D.   On: 11/08/2015 01:46   Ir Perc Cholecystostomy  Result Date: 11/08/2015 INDICATION: 65 year old male with acute acalculous cholecystitis EXAM: IR IMAGE GUIDED DRAINAGE BY PERCUTANEOUS CATHETER MEDICATIONS: The patient is currently admitted to the hospital and receiving intravenous antibiotics. The antibiotics were administered within an appropriate time frame prior to the initiation of the procedure. ANESTHESIA/SEDATION: Fentanyl 50 mcg IV; Versed 1.0 mg IV, 4 mg Zofran Moderate Sedation Time:  35 minutes The patient was continuously monitored during the procedure by the interventional radiology nurse under my direct supervision. COMPLICATIONS: None PROCEDURE: Informed written consent was obtained from the patient and the patient's family after a thorough discussion of the procedural risks, benefits and alternatives. All questions were addressed. Maximal Sterile Barrier Technique was utilized including caps, mask, sterile gowns, sterile gloves, sterile drape, hand hygiene and skin antiseptic. A timeout was performed prior to the initiation of the procedure. Ultrasound survey of the right upper quadrant was performed for planning  purposes. Once the patient is prepped and draped in the usual sterile fashion, the skin and subcutaneous tissues overlying the gallbladder were generously infiltrated 1% lidocaine for local anesthesia. Using ultrasound guidance, attempt was made to guide Chiba needle through liver parenchyma into the gallbladder. The arrangement of the gallbladder in the right upper quadrant precluded a liver passage, given the overlying ribs. Access was through the dome of the gallbladder. With removal of the stylet, spontaneous dark bile drainage occurred. Using modified Seldinger technique, a 10 French drain was placed into the gallbladder fossa, with aspiration of the sample for the lab. Contrast injection confirmed position of the tube within the gallbladder lumen. Drainage catheter was attached to gravity drain with a suture retention placed. Patient tolerated the procedure well and remained hemodynamically stable throughout. No complications were encountered and no significant blood loss encountered. IMPRESSION: Status post percutaneous cholecystostomy. Sample of of bile was sent to the lab for analysis. Signed, Dulcy Fanny. Earleen Newport, DO [facility] Electronically Signed   By: Corrie Mckusick D.O.   On: 11/08/2015 18:06   Dg Abd Portable 1v  Result Date: 11/08/2015 CLINICAL DATA:  No bowel sounds EXAM: PORTABLE ABDOMEN - 1 VIEW COMPARISON:  11/08/2015 FINDINGS: There is normal small bowel gas pattern. A cholecystostomy catheter is noted in right lower quadrant. Moderate gas noted within transverse colon. IMPRESSION: Normal small bowel gas pattern. Moderate gas noted in transverse colon. Cholecystostomy catheter is noted in right upper quadrant. Electronically Signed   By: Lahoma Crocker M.D.   On: 11/08/2015 23:07    Medical Decision Making  SANTI TROUNG is a 65 y.o. male who presents with: acute cholecystitis, likely transient bacteremia, severe CAD, asx R ICA  stenosis <80%, asx possibly >80% L ICA stenosis,    From his history, pt is asx from both possible carotid stenoses.  It remains controversial in regards to benefits of combined CABG-CEA.  The literature appears to have evolved over even the last 5 years.  In general, I have recommended proceeding with management of the sx disease first or both if both are sx.  First, I would reinterrogate the anatomy with a CTA Neck and head to evaluate degree of stenosis, anatomic position, and any presence of intracranial disease.    Second, would draw some blood cultures to check that his bacteremia has cleared.  Additional recommendations will follow once the studies are available. The patient is currently on a statin: Lipitor.  The patient is  currently on an anti-platelet: ASA.  Thank you for allowing [place] to participate in this patient's care.   Adele Barthel, MD [facility]: [phone] Pager: [phone]  11/09/2015, 9:36 AM  Addendum  Reviewed CTA neck: Timing of contrast suboptimal but still good enough to see R ICA <80% & L ICA stenosis appears >80%.  L ICA looks surgically accessible.  - Will discuss further with Dr. Darcey Nora once he's back on Monday.   Adele Barthel, MD [facility] [place] Office: [phone] Pager: [phone]  11/09/2015, 1:44 PM

## 2015-11-09 NOTE — Progress Notes (Signed)
Patient Name: Brian Casey Date of Encounter: 11/09/2015     Active Problems:   Hypothyroidism   Diabetes ([place])   Anxiety state   NICOTINE ADDICTION   HLD (hyperlipidemia)   Essential hypertension   Cholecystitis   PAF (paroxysmal atrial fibrillation) (HCC)   Acute renal failure (ARF) (HCC)   Right upper quadrant pain   Aortic atherosclerosis (HCC)   Positive cardiac stress test   NSTEMI (non-ST elevated myocardial infarction) ([place])   Coronary artery disease involving native coronary artery of native heart with angina pectoris (HCC)   Moderate major depression, single episode ([place])    SUBJECTIVE  No more chest pain, some referred right shoulder pain (perc tube). TPN started.   CURRENT MEDS . amiodarone  200 mg Oral BID  . aspirin EC  81 mg Oral Daily  . atorvastatin  40 mg Oral q1800  . escitalopram  10 mg Oral Daily  . folic acid  1 mg Oral Daily  . insulin aspart  0-9 Units Subcutaneous Q4H  . levalbuterol  1.25 mg Nebulization TID  . levothyroxine  100 mcg Oral QAC breakfast  . magic mouthwash  10 mL Oral TID  . metoprolol tartrate  50 mg Oral BID  . mometasone-formoterol  2 puff Inhalation BID  . pantoprazole  40 mg Oral Daily  . piperacillin-tazobactam (ZOSYN)  IV  3.375 g Intravenous Q8H  . potassium chloride  20 mEq Oral BID  . thiamine  100 mg Oral Daily   Or  . thiamine  100 mg Intravenous Daily    OBJECTIVE  Vitals:   11/09/15 0825 11/09/15 0900 11/09/15 1000 11/09/15 1100  BP:  (!) 155/68 (!) 156/66 (!) 158/70  Pulse:  88 88 85  Resp:  (!) 27 (!) 24 19  Temp:      TempSrc:      SpO2: 94% 96% 95% 93%  Weight:      Height:        Intake/Output Summary (Last 24 hours) at 11/09/15 1206 Last data filed at 11/09/15 0600  Gross per 24 hour  Intake          2805.69 ml  Output              850 ml  Net          1955.69 ml   Filed Weights   11/07/15 1821 11/08/15 0607 11/09/15 0328  Weight: 212 lb 4.8 oz (96.3 kg) 216 lb 8 oz (98.2 kg) 217  lb 6 oz (98.6 kg)    PHYSICAL EXAM  General: Pleasant, NAD. Neuro: Alert and oriented X 3. Moves all extremities spontaneously. Psych: Normal affect. HEENT:  Normal  Neck: Supple without bruits or JVD. Lungs:  Resp regular and unlabored, CTA. Heart: RRR no s3, s4, or murmurs. Obese Abdomen: Soft, non-tender, non-distended, BS + x 4. Perc tube Extremities: No clubbing, cyanosis or edema. DP/PT/Radials 2+ and equal bilaterally.  Accessory Clinical Findings  CBC  Recent Labs  11/08/15 0347 11/08/15 2025 11/09/15 0450  WBC 13.0* 9.5 15.3*  NEUTROABS 11.4*  --  14.1*  HGB 10.6* 10.9* 10.1*  HCT 30.6* 31.3* 29.2*  MCV 94.2 94.6 97.3  PLT 176 196 992   Basic Metabolic Panel  Recent Labs  11/08/15 0347 11/08/15 2025 11/09/15 0450  NA 129* 131* 127*  K 3.6 3.8 4.8  CL 99* 100* 100*  CO2 21* 21* 20*  GLUCOSE 110* 115* 493*  BUN _0 CREATININE 1.29* 1.28* 1.15  CALCIUM 7.9* 8.0* 7.1*  MG 1.8  --  1.8  PHOS  --   --  4.2   Liver Function Tests  Recent Labs  11/08/15 2025 11/09/15 0450  AST 25 78*  ALT 32 61  ALKPHOS 169* 137*  BILITOT 1.4* 0.9  PROT 5.7* 4.8*  ALBUMIN 2.1* 1.9*     TELE  NSR   Radiology/Studies  Ct Abdomen Pelvis Wo Contrast  Result Date: 11/04/2015 CLINICAL DATA:  Right lower quadrant pain extending into the right upper and central abdomen. Symptoms for 2 days. History of hernia repair. EXAM: CT ABDOMEN AND PELVIS WITHOUT CONTRAST TECHNIQUE: Multidetector CT imaging of the abdomen and pelvis was performed following the standard protocol without IV contrast. COMPARISON:  None. FINDINGS: Lower chest: Mild dependent airspace opacities at both lung bases, probably atelectasis. No significant pleural or pericardial effusion. Prominent coronary artery atherosclerosis noted. There is a small hiatal hernia. Hepatobiliary: The hepatic density is diffusely decreased, consistent with steatosis. No focal hepatic abnormalities are identified on  noncontrast imaging. The gallbladder is distended with wall thickening and prominent surrounding inflammatory changes. No calcified gallstones are seen. There is no significant biliary dilatation. Pancreas: Unremarkable. No pancreatic ductal dilatation or surrounding inflammatory changes. Spleen: Normal in size without focal abnormality. Adrenals/Urinary Tract: Both adrenal glands appear normal. Both kidneys demonstrate mild cortical thinning and symmetric perinephric soft tissue stranding. 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 No evidence of renal mass, hydronephrosis or urinary tract calculus. The bladder appears unremarkable. Stomach/Bowel: No evidence bowel wall thickening or distention. The right upper quadrant inflammatory changes do not appear to arise from the duodenum. The appendix appears normal. There is mild distal colonic diverticulosis. Vascular/Lymphatic: There are no enlarged abdominal or pelvic lymph nodes. Aortic and branch vessel atherosclerosis noted. Vascular assessment limited without contrast. Reproductive: There is a focal parenchymal calcification within the right aspect of the prostate gland which is not significantly enlarged. The seminal vesicles appear normal. Other: No evidence of abdominal wall mass or hernia. Musculoskeletal: No acute or significant osseous findings. IMPRESSION: 1. Distended gallbladder with wall thickening and surrounding inflammation highly suspicious for acute cholecystitis. Correlate clinically. No calcified gallstones or biliary dilatation identified. 2. No other acute findings are seen.  The appendix appears normal. 3. Bibasilar atelectasis, hepatic steatosis and mild distal colonic diverticulosis. 4.  Aortic Atherosclerosis (ICD10-170.0) Electronically Signed   By: Richardean Sale M.D.   On: 11/04/2015 10:17   Ct Angio Head W Or Wo Contrast  Result Date: 11/09/2015 CLINICAL DATA:  Noninvasive testing discovered BILATERAL  carotid stenosis. Coronary artery disease. No neurologic symptoms. EXAM: CT ANGIOGRAPHY HEAD AND NECK TECHNIQUE: Multidetector CT imaging of the head and neck was performed using the standard protocol during bolus administration of intravenous contrast. Multiplanar CT image reconstructions and MIPs were obtained to evaluate the vascular anatomy. Carotid stenosis measurements (when applicable) are obtained utilizing NASCET criteria, using the distal internal carotid diameter as the denominator. CONTRAST:  50 mL Isovue 370. COMPARISON:  None. FINDINGS: CT HEAD Calvarium and skull base: No fracture or destructive lesion. Mastoids and middle ears are grossly clear. Paranasal sinuses: Layering fluid RIGHT greater than LEFT maxillary sinuses. Orbits: Negative. Brain: No evidence of acute abnormality, including acute infarct, hemorrhage, hydrocephalus, or mass lesion. Mild atrophy, slightly premature for age. Slight hypoattenuation of white matter representing chronic microvascular ischemic change. CTA NECK Aortic arch: Standard branching. Imaged portion shows no evidence of aneurysm or dissection. No significant stenosis of  the major arch vessel origins. Right carotid system: Moderate calcific and noncalcified plaque proximal RIGHT common carotid artery, non stenotic. Irregular calcified and noncalcified plaque at the bifurcation, RIGHT ICA origin, 60-70% stenosis based on luminal measurements 1.9/5.6 proximal/distal. No evidence of dissection, or occlusion. Left carotid system: Non stenotic atheromatous change in the proximal LEFT common carotid artery. Irregular calcific and noncalcified plaque at the LEFT ICA origin, 90% or greater stenosis, based on luminal measurements of 0.6/4.1 proximal/distal. Some apparent reduction in luminal caliber of the cervical LEFT ICA could underestimate the degree of stenosis. No evidence of dissection, or occlusion. Vertebral arteries: Codominant. Calcified plaque at the LEFT vertebral  origin estimated 50% stenosis. No evidence of dissection, or occlusion. Nonvascular soft tissues: BILATERAL pleural effusions. BILATERAL airspace opacities, correlate clinically for pneumonia versus edema, greater in the LEFT upper lobe. No neck masses. Airway midline. Central venous catheter without pneumothorax. Cervical spondylosis. In CTA HEAD Anterior circulation: Calcific plaque in the cavernous and supraclinoid RIGHT ICA segments, non stenotic. 50% tandem stenoses due to calcific plaque in the inferior and superior cavernous segments of the LEFT ICA. BILATERAL ICA supraclinoid and termini segments widely patent. No M1 stenosis or occlusion. Both anterior cerebral arteries unremarkable. No significant MCA branch stenosis or occlusion. No visible aneurysm or vascular malformation. Posterior circulation: Both vertebral arteries contribute to formation of basilar which is widely patent. No cerebellar branch occlusion or proximal PCA disease. No aneurysm, or vascular malformation. Venous sinuses: As permitted by contrast timing, patent. Anatomic variants: None of significance. Delayed phase:   No abnormal intracranial enhancement. IMPRESSION: Significant calcific and noncalcified plaque at the LEFT ICA origin, luminal diameter of less than 1 mm, estimated 90% stenosis or greater. Similar less severe calcific and noncalcified plaque, RIGHT ICA origin, estimated 60-70% stenosis. Tandem 50% stenoses cavernous LEFT ICA, but no other intracranial atheromatous change of significance. No posterior circulation disease of significance. BILATERAL pleural effusions, and BILATERAL LEFT greater than RIGHT airspace opacities of uncertain significance. Correlate clinically for pneumonia versus edema. Electronically Signed   By: Staci Righter M.D.   On: 11/09/2015 11:30   Dg Chest 2 View  Result Date: 11/08/2015 CLINICAL DATA:  Shortness of breath. Abdominal pain. Gallbladder and heart complications. EXAM: CHEST  2 VIEW  COMPARISON:  11/10/2013 FINDINGS: Mild cardiac enlargement with central pulmonary vascular congestion. Mild interstitial pattern to the lungs likely represents early interstitial edema. Changes are new since prior study. Small bilateral pleural effusions. No pneumothorax. Calcified aorta. IMPRESSION: Cardiac enlargement with mild central pulmonary vascular congestion, interstitial edema, and small pleural effusions. Electronically Signed   By: Lucienne Capers M.D.   On: 11/08/2015 01:46   Ct Angio Neck W Or Wo Contrast  Result Date: 11/09/2015 CLINICAL DATA:  Noninvasive testing discovered BILATERAL carotid stenosis. Coronary artery disease. No neurologic symptoms. EXAM: CT ANGIOGRAPHY HEAD AND NECK TECHNIQUE: Multidetector CT imaging of the head and neck was performed using the standard protocol during bolus administration of intravenous contrast. Multiplanar CT image reconstructions and MIPs were obtained to evaluate the vascular anatomy. Carotid stenosis measurements (when applicable) are obtained utilizing NASCET criteria, using the distal internal carotid diameter as the denominator. CONTRAST:  50 mL Isovue 370. COMPARISON:  None. FINDINGS: CT HEAD Calvarium and skull base: No fracture or destructive lesion. Mastoids and middle ears are grossly clear. Paranasal sinuses: Layering fluid RIGHT greater than LEFT maxillary sinuses. Orbits: Negative. Brain: No evidence of acute abnormality, including acute infarct, hemorrhage, hydrocephalus, or mass lesion. Mild atrophy, slightly premature for  age. Slight hypoattenuation of white matter representing chronic microvascular ischemic change. CTA NECK Aortic arch: Standard branching. Imaged portion shows no evidence of aneurysm or dissection. No significant stenosis of the major arch vessel origins. Right carotid system: Moderate calcific and noncalcified plaque proximal RIGHT common carotid artery, non stenotic. Irregular calcified and noncalcified plaque at the  bifurcation, RIGHT ICA origin, 60-70% stenosis based on luminal measurements 1.9/5.6 proximal/distal. No evidence of dissection, or occlusion. Left carotid system: Non stenotic atheromatous change in the proximal LEFT common carotid artery. Irregular calcific and noncalcified plaque at the LEFT ICA origin, 90% or greater stenosis, based on luminal measurements of 0.6/4.1 proximal/distal. Some apparent reduction in luminal caliber of the cervical LEFT ICA could underestimate the degree of stenosis. No evidence of dissection, or occlusion. Vertebral arteries: Codominant. Calcified plaque at the LEFT vertebral origin estimated 50% stenosis. No evidence of dissection, or occlusion. Nonvascular soft tissues: BILATERAL pleural effusions. BILATERAL airspace opacities, correlate clinically for pneumonia versus edema, greater in the LEFT upper lobe. No neck masses. Airway midline. Central venous catheter without pneumothorax. Cervical spondylosis. In CTA HEAD Anterior circulation: Calcific plaque in the cavernous and supraclinoid RIGHT ICA segments, non stenotic. 50% tandem stenoses due to calcific plaque in the inferior and superior cavernous segments of the LEFT ICA. BILATERAL ICA supraclinoid and termini segments widely patent. No M1 stenosis or occlusion. Both anterior cerebral arteries unremarkable. No significant MCA branch stenosis or occlusion. No visible aneurysm or vascular malformation. Posterior circulation: Both vertebral arteries contribute to formation of basilar which is widely patent. No cerebellar branch occlusion or proximal PCA disease. No aneurysm, or vascular malformation. Venous sinuses: As permitted by contrast timing, patent. Anatomic variants: None of significance. Delayed phase:   No abnormal intracranial enhancement. IMPRESSION: Significant calcific and noncalcified plaque at the LEFT ICA origin, luminal diameter of less than 1 mm, estimated 90% stenosis or greater. Similar less severe calcific  and noncalcified plaque, RIGHT ICA origin, estimated 60-70% stenosis. Tandem 50% stenoses cavernous LEFT ICA, but no other intracranial atheromatous change of significance. No posterior circulation disease of significance. BILATERAL pleural effusions, and BILATERAL LEFT greater than RIGHT airspace opacities of uncertain significance. Correlate clinically for pneumonia versus edema. Electronically Signed   By: Staci Righter M.D.   On: 11/09/2015 11:30   US Renal  Result Date: 11/04/2015 CLINICAL DATA:  Acute renal failure. EXAM: RENAL / URINARY TRACT ULTRASOUND COMPLETE COMPARISON:  None. FINDINGS: Right Kidney: Length: 12.4 cm. Echogenicity within normal limits. No mass or hydronephrosis visualized. Left Kidney: Length: 11.1 cm. Echogenicity within normal limits. No mass or hydronephrosis visualized. Bladder: Appears normal for degree of bladder distention. IMPRESSION: Negative. Electronically Signed   By: Staci Righter M.D.   On: 11/04/2015 17:31   Nm Myocar Multi W/spect W/wall Motion / Ef  Result Date: 11/06/2015 Pharmacological myocardial perfusion imaging study with moderate sized region of moderate intensity ischemia in the lateral wall Normal wall motion, EF estimated at 68% No EKG changes concerning for ischemia at peak stress or in recovery. High risk scan given lateral wall ischemia, given recent troponin elevation Signed, Esmond Plants, MD, Ph.D [facility]   Ir Perc Cholecystostomy  Result Date: 11/08/2015 INDICATION: 65 year old male with acute acalculous cholecystitis EXAM: IR IMAGE GUIDED DRAINAGE BY PERCUTANEOUS CATHETER MEDICATIONS: The patient is currently admitted to the hospital and receiving intravenous antibiotics. The antibiotics were administered within an appropriate time frame prior to the initiation of the procedure. ANESTHESIA/SEDATION: Fentanyl 50 mcg IV; Versed 1.0 mg IV, 4 mg  Zofran Moderate Sedation Time:  35 minutes The patient was continuously monitored during the  procedure by the interventional radiology nurse under my direct supervision. COMPLICATIONS: None PROCEDURE: Informed written consent was obtained from the patient and the patient's family after a thorough discussion of the procedural risks, benefits and alternatives. All questions were addressed. Maximal Sterile Barrier Technique was utilized including caps, mask, sterile gowns, sterile gloves, sterile drape, hand hygiene and skin antiseptic. A timeout was performed prior to the initiation of the procedure. Ultrasound survey of the right upper quadrant was performed for planning purposes. Once the patient is prepped and draped in the usual sterile fashion, the skin and subcutaneous tissues overlying the gallbladder were generously infiltrated 1% lidocaine for local anesthesia. Using ultrasound guidance, attempt was made to guide Chiba needle through liver parenchyma into the gallbladder. The arrangement of the gallbladder in the right upper quadrant precluded a liver passage, given the overlying ribs. Access was through the dome of the gallbladder. With removal of the stylet, spontaneous dark bile drainage occurred. Using modified Seldinger technique, a 10 French drain was placed into the gallbladder fossa, with aspiration of the sample for the lab. Contrast injection confirmed position of the tube within the gallbladder lumen. Drainage catheter was attached to gravity drain with a suture retention placed. Patient tolerated the procedure well and remained hemodynamically stable throughout. No complications were encountered and no significant blood loss encountered. IMPRESSION: Status post percutaneous cholecystostomy. Sample of of bile was sent to the lab for analysis. Signed, Dulcy Fanny. Earleen Newport, DO [facility] Electronically Signed   By: Corrie Mckusick D.O.   On: 11/08/2015 18:06   Dg Abd Portable 1v  Result Date: 11/08/2015 CLINICAL DATA:  No bowel sounds  EXAM: PORTABLE ABDOMEN - 1 VIEW COMPARISON:  11/08/2015 FINDINGS: There is normal small bowel gas pattern. A cholecystostomy catheter is noted in right lower quadrant. Moderate gas noted within transverse colon. IMPRESSION: Normal small bowel gas pattern. Moderate gas noted in transverse colon. Cholecystostomy catheter is noted in right upper quadrant. Electronically Signed   By: Lahoma Crocker M.D.   On: 11/08/2015 23:07   US Abdomen Limited Ruq  Result Date: 11/04/2015 CLINICAL DATA:  Right-sided abdominal pain for 2 days.  Abnormal CT. EXAM: US ABDOMEN LIMITED - RIGHT UPPER QUADRANT COMPARISON:  CT 11/04/2015. FINDINGS: Gallbladder: Gallbladder wall thickening to 7 mm is noted at the gallbladder fundus. Gallbladder sludge is present. No discrete gallstones are seen. Sonographic Percell Miller sign is absent according to the sonographer. Common bile duct: Diameter: 5 mm. Liver: Increased echogenicity corresponding with steatosis on CT. No focal hepatic abnormalities identified. IMPRESSION: 1. Gallbladder wall thickening and sludge without gallstones, sonographic Murphy sign or biliary dilatation. 2. I reviewed the CT from earlier today, and the inflammatory changes on that study are in the right upper quadrant with the epicenter at the gallbladder. In conjunction with that study, findings still remain concerning for acalculous cholecystitis. The patient's appendix does lie in the right upper quadrant adjacent to the gallbladder, but appears normal. There is no apparent wall thickening of the duodenum or colon. The pancreas appears normal, and the patient does not have an elevated lipase level, although does have leukocytosis. Electronically Signed   By: Richardean Sale M.D.   On: 11/04/2015 11:35   Procedures  Left Heart Cath and Coronary Angiography  Conclusion   Dist RCA lesion, 95 %stenosed.  Ramus lesion, 90 %stenosed.  Ost Cx lesion, 95 %stenosed.  Mid LAD  lesion, 70 %stenosed.  Dist LAD lesion, 60  %stenosed.  Ost LM to LM lesion, 40 %stenosed.      2D ECHO: 11/04/2015 LV EF: 50% -   55% Study Conclusions - Left ventricle: The cavity size was normal. Systolic function was   normal. The estimated ejection fraction was in the range of 50%   to 55%. Wall motion was normal; there were no regional wall   motion abnormalities. Features are consistent with a pseudonormal   left ventricular filling pattern, with concomitant abnormal   relaxation and increased filling pressure (grade 2 diastolic   dysfunction). - Left atrium: The atrium was at the upper limits of normal in   size. - Right ventricle: Systolic function was normal. - Pulmonary arteries: Systolic pressure was within the normal   range. Impressions: - Rhythm is normal sinus.   ASSESSMENT AND PLAN 65 y.o. male with h/o DM2, HTN, HLD, hypothyroidisim, and chronic alcohol abuse presented to [facility] on 11/04/15 with RUQ abdominal and was found to have acute cholecystitis on CT scan as well as new onset Afib with RVR with heart rates in the 180's bpm. He underwent myoview for pre op risk stratification which was high risk and subsequent LHC showing severe 4VCAD. He was transferred from [facility] to [facility] on 11/07/15 for evaluation by [facility]. Dr. Darcey Nora plans on surgery next week.    Severe multivessel CAD: severe 4V CAD. Plan is for CABG next week. Continue ASA, BB and statin.    B carotid stenosis: Dr. Bridgett Larsson has evaluated. CT angiogram: Significant calcific and noncalcified plaque at the   LEFT ICA origin,luminal diameter of less than 1 mm, estimated 90% stenosis or greater.  Similar less severe calcific and noncalcified plaque,  RIGHT ICA origin, estimated 60-70% stenosis.  Tandem 50% stenoses cavernous LEFT ICA, but no other intracranial atheromatous change of significance.  No posterior circulation disease of significance.  BILATERAL pleural effusions, and BILATERAL LEFT greater than RIGHT airspace opacities of uncertain  significance. Correlate clinically for pneumonia versus edema.  PAF: was in afib with RVR on admission and placed on amio gtt. He has had short bursts of RVR since.  He has since converted to NSR and now on PO amiodarone 232m BID. Lopressor to 588mBID.  -CHADS2VASc at least 3 (HTN, DM, vascular disease) -Heparin gtt, transition to DOLarimerost op    Cholecystitis: surgery deferred at this time. Continue Zosyn. Currently getting PICC line for nutrition. Surgery placed perc tube for drainage.   [facility]  HTN:  lopressor to 5015mID  HLD: continue Lipitor. Last lipid panel 10/2014 and LDL 88  DM: HgA1c 6.4  Pain: will consult [facility] to manage pain and current non cardiac issues. Cholecystitis, TPN assistance and oversight, depression. Having some referred shoulder pain. Appreciate their assistance.   Signed, MarCandee Furbish

## 2015-11-09 NOTE — Significant Event (Addendum)
Rapid Response Event Note  Overview: Time Called: 1935 Arrival Time: 1940 Event Type: Other (Comment)  Initial Focused Assessment: Called by RN to assess patient. Patient febrile, + chills, dusky skin color per RN and family.  Patient s/p cholecystostomy tube placed this afternoon. Patient alert, talking, in A-Flutter in the 140s, hypertensive, belly not tender, lung sounds clear, + pulses. Cards fellow at bedside. SBP in the 160s. Drain site intact, drainage minimal in bag, patient on heparin drip and TPN.   Interventions: -- had primary RN give bedtime dose anti-rhythmic drugs now to help with aflutter -- labs ordered cbc, cmp, lactic, blood cultures x 2 -- 1L NS bolus given -- temp improved to 101, HR low 100s, SBP in the 120s  Plan of Care (if not transferred): tx to SDU 3S08, with family at bedside  Event Summary: Name of Physician Notified: Dr. Domingo Sep at [facility]    at    Event End Time: 2150  Netty Starring

## 2015-11-09 NOTE — Consult Note (Signed)
Consultation Note   Brian Casey PJK:[id number] DOB: 1950/07/31 DOA: 11/07/2015   PCP: [name], MD/[facility] (UNASSIGNED)  Patient coming from/Resides with: Private residence  Requesting physician: Dr. Marlou Porch  Reason for consultation: Assist in management of multiple medical problems including management of diabetes, antibiotic therapy for sepsis and pain medications  HPI: Brian Casey is a 65 y.o. male with medical history significant for hypothyroidism, diabetes on metformin, chronic alcohol abuse, history of depression and ongoing tobacco abuse. The patient initially presented to the [facility] ER and was admitted on 8/14 with a diagnosis of likely cholecystitis. He presented with sepsis physiology as well as new onset atrial fibrillation and acute kidney injury. During the hospitalization patient had positive troponins so a stress test was obtained and was positive. Patient later underwent cardiac catheterization which revealed multivessel CAD. He was also formally diagnosed with cholecystitis. His sepsis was treated with empiric broad-spectrum antibiotics. Nephrology was consulted at [facility] for his acute kidney injury. He was eventually transferred to this facility where he was evaluated by general surgery who felt the patient was a poor surgical candidate at this time and the patient was subsequently taken to interventional radiology where a percutaneous cholecystostomy tube was placed. He was also evaluated by CVTS who recommended CABG procedure next week after gallbladder was drained, abdominal tenderness resolved and nutritional status improved. Because patient's pre-albumin was 10.1 thoracic surgery started the patient on parenteral nutrition after arrival. As part of his pre-CABG evaluation he underwent carotid duplex which did reveal carotid stenosis and subsequently vascular surgery has been consulted for recommendations.  At the present time the patient is  primarily can complaining of abdominal pain. In review of recent x-rays and abdominal film performed on 8/18 showed no evidence of bowel obstruction. The patient is also complaining that he is hungry and would like to eat. During my interview with the patient he was incontinent of a small to moderate amount of liquid green stool. He reports he has had no difficulty with pain medications in the past. He is currently not having any chest pain or shortness of breath.   Review of Systems:  In addition to the HPI above,  No Fever-chills, myalgias or other constitutional symptoms since arrival to this facility No Headache, changes with Vision or hearing, new weakness, tingling, numbness in any extremity, No problems swallowing food or Liquids, indigestion/reflux No Chest pain, Cough or Shortness of Breath, palpitations, orthopnea or DOE No N/V; no melena or hematochezia, no dark tarry stools No dysuria, hematuria or flank pain No new skin rashes, lesions, masses or bruises, No new joints pains-aches No recent weight gain or loss No polyuria, polydypsia or polyphagia,   Past Medical History:  Diagnosis Date  . Acute renal failure ([place]) 11/06/2015   [name] 11/06/2015  . Anxiety   . Arthritis    "lower spine; fingers" (11/07/2015)  . Depression   . GERD (gastroesophageal reflux disease)   . High cholesterol   . Hypertension 10/17/2013  . Hyperthyroidism    "had it radiated in his '80s"  . Hypothyroidism   . New onset atrial fibrillation ([place]) 11/06/2015   [name] 11/07/2015  . NSTEMI (non-ST elevated myocardial infarction) ([place]) 11/07/2015  . Thyroid disease   . Type II diabetes mellitus ([place])     Past Surgical History:  Procedure Laterality Date  . CARDIAC CATHETERIZATION Right 11/06/2015   Procedure: Left Heart Cath and Coronary Angiography;  Surgeon: Minna Merritts, MD;  Location: [place] [facility];  Service: Cardiovascular;  Laterality: Right;  . COLONOSCOPY    . FINGER GANGLION  CYST EXCISION Left X 3   "index finger X 2; thumb X 1"  . [place]  . HERNIA REPAIR    . INSERTION OF MESH N/A 11/16/2013   Procedure: INSERTION OF MESH;  Surgeon: Imogene Burn. Georgette Dover, MD;  Location: [place];  Service: General;  Laterality: N/A;  . IR GENERIC HISTORICAL  11/08/2015   IR PERC CHOLECYSTOSTOMY 11/08/2015 Corrie Mckusick, DO [facility]-INTERV RAD  . UMBILICAL HERNIA REPAIR N/A 11/16/2013   Procedure: UMBILICAL HERNIA REPAIR;  Surgeon: Imogene Burn. Georgette Dover, MD;  Location: [place] [facility];  Service: General;  Laterality: N/A;    Social History   Social History  . Marital status: Married    Spouse name: N/A  . Number of children: 2  . Years of education: N/A   Occupational History  . Product Engineer-terminated, Engineer, manufacturing systems    Social History Main Topics  . Smoking status: Current Every Day Smoker    Packs/day: 0.12    Years: 48.00    Types: Cigarettes  . Smokeless tobacco: Never Used     Comment: 11/07/2015 "I've never inhaled"  . Alcohol use 34.2 oz/week    36 Cans of beer, 21 Shots of liquor per week     Comment: 8/17.2017 "last drink was 11/03/2015 which I threw out"  . Drug use: No  . Sexual activity: Yes   Other Topics Concern  . Not on file   Social History Narrative  . No narrative on file    Mobility: At baseline without assistive devices-has PT ordered Work history: Not obtained   Allergies  Allergen Reactions  . Bupropion Hcl     REACTION: Severe constipation, insomnia  . Penicillins Other (See Comments)    REACTION: Vomiting Has patient had a PCN reaction causing immediate rash, facial/tongue/throat swelling, SOB or lightheadedness with hypotension: Yes Has patient had a PCN reaction causing severe rash involving mucus membranes or skin necrosis: No Has patient had a PCN reaction that required hospitalization No Has patient had a PCN reaction occurring within the last 10 years: No If all of the above answers are "NO", then may  proceed with Cephalosporin use.    Family History  Problem Relation Age of Onset  . Cancer Mother     colon  . Hypertension Mother   . Diabetes Mother   . Depression Mother   . Colon cancer Mother   . Hyperthyroidism Father   . Hypertension Father   . Cancer Father     prostate  . Diabetes Maternal Grandmother   . Cancer Maternal Grandmother     lung, non smoker    Prior to Admission medications   Medication Sig Start Date End Date Taking? Authorizing Provider  ALPRAZolam (XANAX) 0.25 MG tablet Take 1 tablet (0.25 mg total) by mouth 2 (two) times daily as needed. Patient taking differently: Take 0.25 mg by mouth 2 (two) times daily as needed for anxiety.  11/06/14  Yes Lucille Passy, MD  levothyroxine (SYNTHROID, LEVOTHROID) 100 MCG tablet Take 1 tablet (100 mcg total) by mouth daily before breakfast. OFFICE VISIT WITH  LABS REQUIRED FOR ADDITIONAL REFILLS 10/07/15  Yes Lucille Passy, MD  losartan-hydrochlorothiazide (HYZAAR) 100-25 MG tablet Take 1 tablet by mouth daily.   Yes Historical Provider, MD  metFORMIN (GLUCOPHAGE) 500 MG tablet Take 500 mg by mouth 2 (two) times daily with a meal.   Yes  Historical Provider, MD  omeprazole (PRILOSEC) 20 MG capsule Take 1 capsule (20 mg total) by mouth daily. OFFICE VISIT REQUIRED FOR ADDITIONAL REFILLS 10/07/15  Yes Lucille Passy, MD  Probiotic Product (PROBIOTIC PO) Take by mouth.   Yes Historical Provider, MD  simvastatin (ZOCOR) 40 MG tablet Take 1 tablet (40 mg total) by mouth at bedtime. 12/10/14  Yes Lucille Passy, MD  amiodarone (PACERONE) 400 MG tablet Take 1 tablet (400 mg total) by mouth 2 (two) times daily. Patient not taking: Reported on 11/07/2015 11/07/15   Bettey Costa, MD  aspirin 81 MG chewable tablet Chew 1 tablet (81 mg total) by mouth before cath procedure. Patient not taking: Reported on 11/07/2015 11/07/15   Bettey Costa, MD  diltiazem (CARDIZEM) 30 MG tablet Take 1 tablet (30 mg total) by mouth 3 (three) times daily. Patient not  taking: Reported on 11/07/2015 11/07/15   Bettey Costa, MD  escitalopram (LEXAPRO) 10 MG tablet Take 1 tablet (10 mg total) by mouth daily. Patient not taking: Reported on 11/07/2015 11/07/15   Bettey Costa, MD  metoprolol tartrate (LOPRESSOR) 25 MG tablet Take 1 tablet (25 mg total) by mouth 2 (two) times daily. Patient not taking: Reported on 11/07/2015 11/07/15   Bettey Costa, MD  piperacillin-tazobactam (ZOSYN) 3.375 GM/50ML IVPB Inject 50 mLs (3.375 g total) into the vein every 8 (eight) hours. Patient not taking: Reported on 11/07/2015 11/06/15   Bettey Costa, MD  temazepam (RESTORIL) 15 MG capsule Take 1 capsule (15 mg total) by mouth at bedtime as needed for sleep. Patient not taking: Reported on 11/07/2015 11/07/15   Bettey Costa, MD    Physical Exam: Vitals:   11/09/15 0825 11/09/15 0900 11/09/15 1000 11/09/15 1100  BP:  (!) 155/68 (!) 156/66 (!) 158/70  Pulse:  88 88 85  Resp:  (!) 27 (!) 24 19  Temp:    99.1 F (37.3 C)  TempSrc:    Oral  SpO2: 94% 96% 95% 93%  Weight:      Height:          Constitutional: NAD, Uncomfortable related to ongoing abdominal pain primarily around the percutaneous cholecystostomy tube site-became quite anxious when he had incontinence of liquid stool Eyes: PERRL, lids and conjunctivae normal ENMT: Mucous membranes are moist. Posterior pharynx clear of any exudate or lesions.Normal dentition.  Neck: normal, supple, no masses, no thyromegaly Respiratory: clear to auscultation bilaterally, no wheezing, no crackles. Normal respiratory effort. No accessory muscle use. 2 L oxygen Cardiovascular: Regular rate and really maintaining sinus rhythm, no murmurs / rubs / gallops. No extremity edema. 2+ pedal pulses. No carotid bruits.  Abdomen: Somewhat distended and tender focally over cholecystostomy tube site with hyperactive bowel sounds, no masses palpated. No hepatosplenomegaly. Cholecystostomy tube draining small amount of bilious fluid Musculoskeletal: no clubbing /  cyanosis. No joint deformity upper and lower extremities. Good ROM, no contractures. Normal muscle tone.  Skin: no rashes, lesions, ulcers. No induration Neurologic: CN 2-12 grossly intact. Sensation intact, DTR normal. Strength 5/5 x all 4 extremities.  Psychiatric: Normal judgment and insight. Alert and oriented x 3. Anxious and somewhat depressed mood.    Labs on Admission: I have personally reviewed following labs and imaging studies  CBC:  Recent Labs Lab 11/04/15 0841  11/06/15 0344 11/07/15 0648 11/08/15 0347 11/08/15 2025 11/09/15 0450  WBC 24.2*  < > 12.7* 12.3* 13.0* 9.5 15.3*  NEUTROABS 22.6*  --   --   --  11.4*  --  14.1*  HGB 14.6  < > 12.5* 11.5* 10.6* 10.9* 10.1*  HCT 40.9  < > 36.0* 31.8* 30.6* 31.3* 29.2*  MCV 96.2  < > 97.0 95.9 94.2 94.6 97.3  PLT 176  < > 144* 144* 176 196 174  < > = values in this interval not displayed. Basic Metabolic Panel:  Recent Labs Lab 11/04/15 0842  11/06/15 0344 11/07/15 0648 11/08/15 0347 11/08/15 2025 11/09/15 0450  NA  --   < > 127* 131* 129* 131* 127*  K  --   < > 4.0 3.7 3.6 3.8 4.8  CL  --   < > 97* 102 99* 100* 100*  CO2  --   < > 24 22 21* 21* 20*  GLUCOSE  --   < > 177* 150* 110* 115* 493*  BUN  --   < > 24* _0 CREATININE  --   < > 1.75* 1.24 1.29* 1.28* 1.15  CALCIUM  --   < > 8.0* 7.4* 7.9* 8.0* 7.1*  MG 1.9  --   --   --  1.8  --  1.8  PHOS  --   --   --   --   --   --  4.2  < > = values in this interval not displayed. GFR: Estimated Creatinine Clearance: 73.6 mL/min (by C-G formula based on SCr of 1.15 mg/dL). Liver Function Tests:  Recent Labs Lab 11/04/15 0841 11/05/15 0450 11/08/15 0347 11/08/15 2025 11/09/15 0450  AST _1 78*  ALT 22 20 33 32 61  ALKPHOS 55 66 163* 169* 137*  BILITOT 1.8* 1.1 1.3* 1.4* 0.9  PROT 6.9 5.6* 5.5* 5.7* 4.8*  ALBUMIN 3.5 2.7* 2.2* 2.1* 1.9*    Recent Labs Lab 11/04/15 0841 11/09/15 0450  LIPASE 18 23   No results for input(s): AMMONIA  in the last 168 hours. Coagulation Profile:  Recent Labs Lab 11/04/15 0841 11/08/15 0347  INR 1.14 1.15   Cardiac Enzymes:  Recent Labs Lab 11/04/15 0841 11/04/15 1257 11/04/15 2355 11/05/15 0607  TROPONINI 0.27* 0.04* 0.11* 0.04*   BNP (last 3 results) No results for input(s): PROBNP in the last 8760 hours. HbA1C: No results for input(s): HGBA1C in the last 72 hours. CBG:  Recent Labs Lab 11/08/15 1107 11/08/15 1618 11/08/15 1949 11/09/15 0736 11/09/15 1141  GLUCAP 122* 107* 119* 210* 184*   Lipid Profile:  Recent Labs  11/09/15 0450  TRIG 437*   Thyroid Function Tests:  Recent Labs  11/09/15 0822  TSH 4.905*   Anemia Panel: No results for input(s): VITAMINB12, FOLATE, FERRITIN, TIBC, IRON, RETICCTPCT in the last 72 hours. Urine analysis:    Component Value Date/Time   COLORURINE YELLOW 11/08/2015 1959   APPEARANCEUR CLEAR 11/08/2015 1959   LABSPEC 1.019 11/08/2015 1959   PHURINE 6.5 11/08/2015 1959   GLUCOSEU 100 (A) 11/08/2015 1959   HGBUR NEGATIVE 11/08/2015 [place] NEGATIVE 11/08/2015 1959   KETONESUR 15 (A) 11/08/2015 1959   PROTEINUR NEGATIVE 11/08/2015 1959   NITRITE NEGATIVE 11/08/2015 1959   LEUKOCYTESUR NEGATIVE 11/08/2015 1959   Sepsis Labs: _2 (procalcitonin:4,lacticidven:4) ) Recent Results (from the past 240 hour(s))  Culture, body fluid-bottle     Status: None (Preliminary result)   Collection Time: 11/08/15  6:02 PM  Result Value Ref Range Status   Specimen Description BILE  Final   Special Requests BOTTLES DRAWN AEROBIC AND ANAEROBIC 5CC  Final   Gram Stain   Final  GRAM NEGATIVE RODS IN BOTH AEROBIC AND ANAEROBIC BOTTLES CRITICAL RESULT CALLED TO, READ BACK BY AND VERIFIED WITH: T KEETON,RN _0  11/09/15 MKELLY    Culture   Final    GRAM NEGATIVE RODS CULTURE REINCUBATED FOR BETTER GROWTH    Report Status PENDING  Incomplete  Gram stain     Status: None   Collection Time: 11/08/15  6:02 PM    Result Value Ref Range Status   Specimen Description BILE  Final   Special Requests NONE  Final   Gram Stain   Final    ABUNDANT WBC PRESENT,BOTH PMN AND MONONUCLEAR ABUNDANT GRAM NEGATIVE RODS    Report Status 11/08/2015 FINAL  Final  Surgical pcr screen     Status: None   Collection Time: 11/08/15  8:00 PM  Result Value Ref Range Status   MRSA, PCR NEGATIVE NEGATIVE Final   Staphylococcus aureus NEGATIVE NEGATIVE Final    Comment:        The Xpert SA Assay (FDA approved for NASAL specimens in patients over 19 years of age), is one component of a comprehensive surveillance program.  Test performance has been validated by [facility] for patients greater than or equal to 25 year old. It is not intended to diagnose infection nor to guide or monitor treatment.   Culture, blood (routine x 2)     Status: None (Preliminary result)   Collection Time: 11/08/15  9:19 PM  Result Value Ref Range Status   Specimen Description BLOOD LEFT ANTECUBITAL  Final   Special Requests BOTTLES DRAWN AEROBIC AND ANAEROBIC 5CC  Final   Culture NO GROWTH < 24 HOURS  Final   Report Status PENDING  Incomplete  Culture, blood (routine x 2)     Status: None (Preliminary result)   Collection Time: 11/08/15  9:29 PM  Result Value Ref Range Status   Specimen Description BLOOD RIGHT HAND  Final   Special Requests BOTTLES DRAWN AEROBIC AND ANAEROBIC 5CC  Final   Culture NO GROWTH < 24 HOURS  Final   Report Status PENDING  Incomplete     Radiological Exams on Admission: Ct Angio Head W Or Wo Contrast  Result Date: 11/09/2015 CLINICAL DATA:  Noninvasive testing discovered BILATERAL carotid stenosis. Coronary artery disease. No neurologic symptoms. EXAM: CT ANGIOGRAPHY HEAD AND NECK TECHNIQUE: Multidetector CT imaging of the head and neck was performed using the standard protocol during bolus administration of intravenous contrast. Multiplanar CT image reconstructions and MIPs were obtained to evaluate the  vascular anatomy. Carotid stenosis measurements (when applicable) are obtained utilizing NASCET criteria, using the distal internal carotid diameter as the denominator. CONTRAST:  50 mL Isovue 370. COMPARISON:  None. FINDINGS: CT HEAD Calvarium and skull base: No fracture or destructive lesion. Mastoids and middle ears are grossly clear. Paranasal sinuses: Layering fluid RIGHT greater than LEFT maxillary sinuses. Orbits: Negative. Brain: No evidence of acute abnormality, including acute infarct, hemorrhage, hydrocephalus, or mass lesion. Mild atrophy, slightly premature for age. Slight hypoattenuation of white matter representing chronic microvascular ischemic change. CTA NECK Aortic arch: Standard branching. Imaged portion shows no evidence of aneurysm or dissection. No significant stenosis of the major arch vessel origins. Right carotid system: Moderate calcific and noncalcified plaque proximal RIGHT common carotid artery, non stenotic. Irregular calcified and noncalcified plaque at the bifurcation, RIGHT ICA origin, 60-70% stenosis based on luminal measurements 1.9/5.6 proximal/distal. No evidence of dissection, or occlusion. Left carotid system: Non stenotic atheromatous change in the proximal LEFT common carotid artery. Irregular calcific  and noncalcified plaque at the LEFT ICA origin, 90% or greater stenosis, based on luminal measurements of 0.6/4.1 proximal/distal. Some apparent reduction in luminal caliber of the cervical LEFT ICA could underestimate the degree of stenosis. No evidence of dissection, or occlusion. Vertebral arteries: Codominant. Calcified plaque at the LEFT vertebral origin estimated 50% stenosis. No evidence of dissection, or occlusion. Nonvascular soft tissues: BILATERAL pleural effusions. BILATERAL airspace opacities, correlate clinically for pneumonia versus edema, greater in the LEFT upper lobe. No neck masses. Airway midline. Central venous catheter without pneumothorax. Cervical  spondylosis. In CTA HEAD Anterior circulation: Calcific plaque in the cavernous and supraclinoid RIGHT ICA segments, non stenotic. 50% tandem stenoses due to calcific plaque in the inferior and superior cavernous segments of the LEFT ICA. BILATERAL ICA supraclinoid and termini segments widely patent. No M1 stenosis or occlusion. Both anterior cerebral arteries unremarkable. No significant MCA branch stenosis or occlusion. No visible aneurysm or vascular malformation. Posterior circulation: Both vertebral arteries contribute to formation of basilar which is widely patent. No cerebellar branch occlusion or proximal PCA disease. No aneurysm, or vascular malformation. Venous sinuses: As permitted by contrast timing, patent. Anatomic variants: None of significance. Delayed phase:   No abnormal intracranial enhancement. IMPRESSION: Significant calcific and noncalcified plaque at the LEFT ICA origin, luminal diameter of less than 1 mm, estimated 90% stenosis or greater. Similar less severe calcific and noncalcified plaque, RIGHT ICA origin, estimated 60-70% stenosis. Tandem 50% stenoses cavernous LEFT ICA, but no other intracranial atheromatous change of significance. No posterior circulation disease of significance. BILATERAL pleural effusions, and BILATERAL LEFT greater than RIGHT airspace opacities of uncertain significance. Correlate clinically for pneumonia versus edema. Electronically Signed   By: Staci Righter M.D.   On: 11/09/2015 11:30   Dg Chest 2 View  Result Date: 11/08/2015 CLINICAL DATA:  Shortness of breath. Abdominal pain. Gallbladder and heart complications. EXAM: CHEST  2 VIEW COMPARISON:  11/10/2013 FINDINGS: Mild cardiac enlargement with central pulmonary vascular congestion. Mild interstitial pattern to the lungs likely represents early interstitial edema. Changes are new since prior study. Small bilateral pleural effusions. No pneumothorax. Calcified aorta. IMPRESSION: Cardiac enlargement with  mild central pulmonary vascular congestion, interstitial edema, and small pleural effusions. Electronically Signed   By: Lucienne Capers M.D.   On: 11/08/2015 01:46   Ct Angio Neck W Or Wo Contrast  Result Date: 11/09/2015 CLINICAL DATA:  Noninvasive testing discovered BILATERAL carotid stenosis. Coronary artery disease. No neurologic symptoms. EXAM: CT ANGIOGRAPHY HEAD AND NECK TECHNIQUE: Multidetector CT imaging of the head and neck was performed using the standard protocol during bolus administration of intravenous contrast. Multiplanar CT image reconstructions and MIPs were obtained to evaluate the vascular anatomy. Carotid stenosis measurements (when applicable) are obtained utilizing NASCET criteria, using the distal internal carotid diameter as the denominator. CONTRAST:  50 mL Isovue 370. COMPARISON:  None. FINDINGS: CT HEAD Calvarium and skull base: No fracture or destructive lesion. Mastoids and middle ears are grossly clear. Paranasal sinuses: Layering fluid RIGHT greater than LEFT maxillary sinuses. Orbits: Negative. Brain: No evidence of acute abnormality, including acute infarct, hemorrhage, hydrocephalus, or mass lesion. Mild atrophy, slightly premature for age. Slight hypoattenuation of white matter representing chronic microvascular ischemic change. CTA NECK Aortic arch: Standard branching. Imaged portion shows no evidence of aneurysm or dissection. No significant stenosis of the major arch vessel origins. Right carotid system: Moderate calcific and noncalcified plaque proximal RIGHT common carotid artery, non stenotic. Irregular calcified and noncalcified plaque at the bifurcation, RIGHT ICA origin,  60-70% stenosis based on luminal measurements 1.9/5.6 proximal/distal. No evidence of dissection, or occlusion. Left carotid system: Non stenotic atheromatous change in the proximal LEFT common carotid artery. Irregular calcific and noncalcified plaque at the LEFT ICA origin, 90% or greater  stenosis, based on luminal measurements of 0.6/4.1 proximal/distal. Some apparent reduction in luminal caliber of the cervical LEFT ICA could underestimate the degree of stenosis. No evidence of dissection, or occlusion. Vertebral arteries: Codominant. Calcified plaque at the LEFT vertebral origin estimated 50% stenosis. No evidence of dissection, or occlusion. Nonvascular soft tissues: BILATERAL pleural effusions. BILATERAL airspace opacities, correlate clinically for pneumonia versus edema, greater in the LEFT upper lobe. No neck masses. Airway midline. Central venous catheter without pneumothorax. Cervical spondylosis. In CTA HEAD Anterior circulation: Calcific plaque in the cavernous and supraclinoid RIGHT ICA segments, non stenotic. 50% tandem stenoses due to calcific plaque in the inferior and superior cavernous segments of the LEFT ICA. BILATERAL ICA supraclinoid and termini segments widely patent. No M1 stenosis or occlusion. Both anterior cerebral arteries unremarkable. No significant MCA branch stenosis or occlusion. No visible aneurysm or vascular malformation. Posterior circulation: Both vertebral arteries contribute to formation of basilar which is widely patent. No cerebellar branch occlusion or proximal PCA disease. No aneurysm, or vascular malformation. Venous sinuses: As permitted by contrast timing, patent. Anatomic variants: None of significance. Delayed phase:   No abnormal intracranial enhancement. IMPRESSION: Significant calcific and noncalcified plaque at the LEFT ICA origin, luminal diameter of less than 1 mm, estimated 90% stenosis or greater. Similar less severe calcific and noncalcified plaque, RIGHT ICA origin, estimated 60-70% stenosis. Tandem 50% stenoses cavernous LEFT ICA, but no other intracranial atheromatous change of significance. No posterior circulation disease of significance. BILATERAL pleural effusions, and BILATERAL LEFT greater than RIGHT airspace opacities of uncertain  significance. Correlate clinically for pneumonia versus edema. Electronically Signed   By: Staci Righter M.D.   On: 11/09/2015 11:30   Ir Perc Cholecystostomy  Result Date: 11/08/2015 INDICATION: 65 year old male with acute acalculous cholecystitis EXAM: IR IMAGE GUIDED DRAINAGE BY PERCUTANEOUS CATHETER MEDICATIONS: The patient is currently admitted to the hospital and receiving intravenous antibiotics. The antibiotics were administered within an appropriate time frame prior to the initiation of the procedure. ANESTHESIA/SEDATION: Fentanyl 50 mcg IV; Versed 1.0 mg IV, 4 mg Zofran Moderate Sedation Time:  35 minutes The patient was continuously monitored during the procedure by the interventional radiology nurse under my direct supervision. COMPLICATIONS: None PROCEDURE: Informed written consent was obtained from the patient and the patient's family after a thorough discussion of the procedural risks, benefits and alternatives. All questions were addressed. Maximal Sterile Barrier Technique was utilized including caps, mask, sterile gowns, sterile gloves, sterile drape, hand hygiene and skin antiseptic. A timeout was performed prior to the initiation of the procedure. Ultrasound survey of the right upper quadrant was performed for planning purposes. Once the patient is prepped and draped in the usual sterile fashion, the skin and subcutaneous tissues overlying the gallbladder were generously infiltrated 1% lidocaine for local anesthesia. Using ultrasound guidance, attempt was made to guide Chiba needle through liver parenchyma into the gallbladder. The arrangement of the gallbladder in the right upper quadrant precluded a liver passage, given the overlying ribs. Access was through the dome of the gallbladder. With removal of the stylet, spontaneous dark bile drainage occurred. Using modified Seldinger technique, a 10 French drain was placed into the gallbladder fossa, with aspiration of the sample for the lab.  Contrast injection confirmed position of the  tube within the gallbladder lumen. Drainage catheter was attached to gravity drain with a suture retention placed. Patient tolerated the procedure well and remained hemodynamically stable throughout. No complications were encountered and no significant blood loss encountered. IMPRESSION: Status post percutaneous cholecystostomy. Sample of of bile was sent to the lab for analysis. Signed, Dulcy Fanny. Earleen Newport, DO [facility] Radiology Electronically Signed   By: Corrie Mckusick D.O.   On: 11/08/2015 18:06   Dg Abd Portable 1v  Result Date: 11/08/2015 CLINICAL DATA:  No bowel sounds EXAM: PORTABLE ABDOMEN - 1 VIEW COMPARISON:  11/08/2015 FINDINGS: There is normal small bowel gas pattern. A cholecystostomy catheter is noted in right lower quadrant. Moderate gas noted within transverse colon. IMPRESSION: Normal small bowel gas pattern. Moderate gas noted in transverse colon. Cholecystostomy catheter is noted in right upper quadrant. Electronically Signed   By: Lahoma Crocker M.D.   On: 11/08/2015 23:07     Assessment/Plan Principal Problem:   Sepsis due to Gram negative bacteria/Cholecystitis, acute -Sepsis physiology has resolved -Continue Zosyn and vancomycin with high likelihood can de-escalate to Zosyn alone pending results of bilious fluid as well as blood cultures-doubtful has gram-positive infectious process-initial bile culture positive for gram-negative rods -Surgery and IR managing percutaneous drain -Discussed with [name] with general surgery and no indication at this standpoint to continue NPO status so we'll begin slow diet advance (see below) -Pain management has been an issue: Begin with IV morphine until proves can tolerate full liquids/more substantial diet before initiating oral narcotics (oral narcotics would give most consistent pain relief) -prn Tylenol -Encourage continued mobility/OOB to  chair q shift and continue working with PT  Active Problems:   Diabetes mellitus type 2, uncontrolled -Serum glucose this morning was 493 (with associated pseudohyponatremia) and hyperglycemia is likely being exacerbated by parenteral nutrition -Preadmission metformin on hold -Check CBGs before meals/at bedtime and utilize moderate sliding scale insulin -Hemoglobin A1c on 8/14 was 6.4      AKI (acute kidney injury) on CKD (chronic kidney disease), stage II -Renal function consistent with acute kidney injury at presentation on 8/14 with a BUN of 31 and a creatinine of 2.4 -Patient was evaluated by nephrology at previous facility who documented acute kidney injury precipitated by sepsis physiology as well as preadmission meds including ARB, diuretic and metformin -Renal function has recovered and is near baseline of 10/1.05 -Continue to follow closely and avoid offending medications    Essential hypertension/ Left ventricular diastolic dysfunction/grade 2 -Current blood pressure moderately controlled and likely being influenced by inadequate pain control -Continue Lopressor    PAF (paroxysmal atrial fibrillation)  -Patient had paroxysmal atrial fibrillation at the initial portion of his admission likely related to sepsis physiology as well as coronary ischemia -Currently maintaining sinus rhythm on amiodarone and beta blockers -Management per cardiology    NSTEMI (non-ST elevated myocardial infarction) /  Coronary artery disease involving native coronary artery of native heart with angina pectoris /  HLD (hyperlipidemia) -Patient awaiting CABG procedure pending recovery from acute cholecystitis -On beta blocker, statin and aspirin and full dose heparin IV -Management per cardiology and thoracic surgery      Carotid stenosis -Incidental finding on pre-CABG evaluation -Asymptomatic -Management and surgical recommendations per [facility]    Protein-calorie malnutrition, severe  -Patient has  been NPO since initial admission -Pre-albumin 10.1 -Started on parenteral nutrition on 8/18 by thoracic surgery -Abdominal x-ray 8/18 without evidence of obstruction -Discussed with general surgery physician assistant and  no indications to continue NPO status so we'll begin slow dietary advancement with clear liquids and some full liquids today and if tolerates will advance to more solid diet on 8/20 -Nutrition consultation -Gut/alimentary nutrition preferred over parenteral and likely to increase pre-albumin more consistently; hopeful can taper and discontinue parenteral nutrition in the next 48 hours -Follow up on magnesium and phosphorus    Hypothyroidism -Continue Synthroid -TSH slightly elevated at 4.905 with free T4 only slightly elevated at 1.09 and this is likely reflective of sick euthyroid syndrome    Anxiety state/situational depression/  Alcohol abuse -Patient clearly overwhelmed by current medical status and multiple new medical diagnoses as well as prospect of upcoming surgical procedures -Further distress over lack of control over circumstances and recently quite embarrassed by fecal incontinence with healthcare personnel at the bedside -Continue supportive care -Continue Lexapro -Continue low grade CIWA with Ativan although patient out of the window for alcohol withdrawal syndrome    NICOTINE ADDICTION       DVT prophylaxis: On full dose IV heparin Code Status: Full Family Communication: Multiple family members at bedside including son and Schanz sisters Disposition Plan: At discretion of primary team    Linken Mcglothen L. ANP-BC [facility] Pager [phone]   If 7PM-7AM, please contact night-coverage [email or web address] Password [email or web address]  11/09/2015, 1:37 PM

## 2015-11-09 NOTE — Progress Notes (Signed)
ANTICOAGULATION CONSULT NOTE - Follow Up Consult  Pharmacy Consult for Heparin Indication: atrial fibrillation and multivessel disease  Allergies  Allergen Reactions  . Bupropion Hcl     REACTION: Severe constipation, insomnia  . Penicillins Other (See Comments)    REACTION: Vomiting Has patient had a PCN reaction causing immediate rash, facial/tongue/throat swelling, SOB or lightheadedness with hypotension: Yes Has patient had a PCN reaction causing severe rash involving mucus membranes or skin necrosis: No Has patient had a PCN reaction that required hospitalization No Has patient had a PCN reaction occurring within the last 10 years: No If all of the above answers are "NO", then may proceed with Cephalosporin use.    Patient Measurements: Height: _0  (170.2 cm) Weight: 217 lb 6 oz (98.6 kg) IBW/kg (Calculated) : 66.1 Heparin Dosing Weight: 86.7 kg  Vital Signs: Temp: 97.8 F (36.6 C) (08/19 0429) Temp Source: Oral (08/19 0429) BP: 146/68 (08/19 0600) Pulse Rate: 81 (08/19 0600)  Labs:  Recent Labs  11/07/15 1641 11/08/15 0347 11/08/15 2025 11/09/15 0450 11/09/15 0500  HGB  --  10.6* 10.9* 10.1*  --   HCT  --  30.6* 31.3* 29.2*  --   PLT  --  176 196 174  --   LABPROT  --  14.8  --   --   --   INR  --  1.15  --   --   --   HEPARINUNFRC 0.27* 0.26*  --   --  0.37  CREATININE  --  1.29* 1.28* 1.15  --     Estimated Creatinine Clearance: 73.6 mL/min (by C-G formula based on SCr of 1.15 mg/dL).   Assessment: 65 y.o.male presented to [facility] on 11/04/15 with RUQ abdominal and was found to have acute cholecystitis on CT scan as well as new onset Afib with RVR with heart rates in the 180's bpm. He underwent myoview for pre op risk stratification which was high risk and subsequent LHC showing severe 4VCAD. He was transferred to [facility] on 11/07/15 for evaluation by [facility]CS.   Heparin started for new afib + multivessel disease. S/p cholecystostomy tube 8/18, to resume heparin  4 hours after procedure.  No anticoagulation PTA, Hgb low stable, Plt wnl, no bleeding documented   Goal of Therapy:  Heparin level 0.3-0.7 units/ml Monitor platelets by anticoagulation protocol: Yes   Plan:  Continue heparin at 2150 units/hr Daily HL/CBC Monitor s/sx bleeding   Gwenlyn Perking, PharmD PGY1 Pharmacy Resident Pager: [phone] 11/09/2015 7:51 AM

## 2015-11-09 NOTE — Progress Notes (Signed)
CRITICAL VALUE ALERT  Critical value received:  Body fluid, bile, gram - rods  Date of notification:  8/19  Time of notification:  5750  Critical value read back:Yes.    Nurse who received alert:  Elsie Saas RN  MD notified (1st page):  Cardiology fellow  Time of first page:  [phone]  MD notified (2nd page):  Time of second page:  Responding MD:  Cardiology fellow  Time MD responded:  [phone]

## 2015-11-09 NOTE — Progress Notes (Signed)
Pharmacy Antibiotic Note  Brian Casey is a 65 y.o. male admitted on 11/07/2015 now with possible sepsis 2/2 intra-abdominal infection. Pharmacy has been consulted for vancomycin dosing. Patient on Zosyn day 6, WBC up to 15.3, Tmax 102.1, CrCl ~73 ml/min  Plan: Vancomycin 1250 mg IV every 12 hours.  Goal trough 15-20 mcg/mL.  Monitor renal fxn, c/s, clinical status, VT at ss prn  Height: 5' 7" (170.2 cm) Weight: 217 lb 6 oz (98.6 kg) IBW/kg (Calculated) : 66.1  Temp (24hrs), Avg:99.4 F (37.4 C), Min:97.8 F (36.6 C), Max:102.1 F (38.9 C)   Recent Labs Lab 11/06/15 0344 11/07/15 0648 11/08/15 0347 11/08/15 2024 11/08/15 2025 11/09/15 0450  WBC 12.7* 12.3* 13.0*  --  9.5 15.3*  CREATININE 1.75* 1.24 1.29*  --  1.28* 1.15  LATICACIDVEN  --   --   --  1.1  --  1.1    Estimated Creatinine Clearance: 73.6 mL/min (by C-G formula based on SCr of 1.15 mg/dL).    Allergies  Allergen Reactions  . Bupropion Hcl     REACTION: Severe constipation, insomnia  . Penicillins Other (See Comments)    REACTION: Vomiting Has patient had a PCN reaction causing immediate rash, facial/tongue/throat swelling, SOB or lightheadedness with hypotension: Yes Has patient had a PCN reaction causing severe rash involving mucus membranes or skin necrosis: No Has patient had a PCN reaction that required hospitalization No Has patient had a PCN reaction occurring within the last 10 years: No If all of the above answers are "NO", then may proceed with Cephalosporin use.    Antimicrobials this admission: Zosyn 8/14 >> Vanc x 1 8/14, 8/19>> Cefepime x 1 8/14  Microbiology results: 8/18 BCx:  8/18 bile cx: Gr- rods  Thank you for allowing pharmacy to be a part of this patient's care.  Gwenlyn Perking, PharmD PGY1 Pharmacy Resident Pager: [phone] 11/09/2015 1:16 PM

## 2015-11-09 NOTE — Progress Notes (Signed)
Subjective: Just back from CT, he remains nothing by mouth. Complains of pain ongoing abdomen and going to his left shoulder. Pain is better than on admission. Drainage from cholecystostomy tube is dark brown. Catheter site looks fine. Objective: Vital signs in last 24 hours: Temp:  [97.8 F (36.6 C)-102.1 F (38.9 C)] 98.5 F (36.9 C) (08/19 0700) Pulse Rate:  [72-140] 94 (08/19 0700) Resp:  [16-47] 25 (08/19 0700) BP: (127-454)/(56-89) 146/66 (08/19 0700) SpO2:  [92 %-100 %] 94 % (08/19 0825) Weight:  [98.6 kg (217 lb 6 oz)] 98.6 kg (217 lb 6 oz) (08/19 0328) Last BM Date: 11/08/15 NPO 2000 IV fluids Urine 850 MAXIMUM TEMPERATURE 100 at 2300 hrs. vital signs otherwise stable NA 127, glucose 493 total bilirubin is 0.9 Prealbumin 10.1 WBC 15.1-ongoing left shift H/H down with hydration Elevated TSH  Intake/Output from previous day: 08/18 0701 - 08/19 0700 In: 2805.7 [I.V.:655.7; IV Piggyback:150] Out: 850 [Urine:850] Intake/Output this shift: No intake/output data recorded.  General appearance: alert, cooperative and no distress GI: Soft, no acute tenderness of the abdomen. Some ongoing discomfort with pain going to the removed right shoulder. IR drain intact and working well.  Lab Results:   Recent Labs  11/08/15 2025 11/09/15 0450  WBC 9.5 15.3*  HGB 10.9* 10.1*  HCT 31.3* 29.2*  PLT 196 174    BMET  Recent Labs  11/08/15 2025 11/09/15 0450  NA 131* 127*  K 3.8 4.8  CL 100* 100*  CO2 21* 20*  GLUCOSE 115* 493*  BUN 11 11  CREATININE 1.28* 1.15  CALCIUM 8.0* 7.1*   PT/INR  Recent Labs  11/08/15 0347  LABPROT 14.8  INR 1.15     Recent Labs Lab 11/04/15 0841 11/05/15 0450 11/08/15 0347 11/08/15 2025 11/09/15 0450  AST _0 78*  ALT 22 20 33 32 61  ALKPHOS 55 66 163* 169* 137*  BILITOT 1.8* 1.1 1.3* 1.4* 0.9  PROT 6.9 5.6* 5.5* 5.7* 4.8*  ALBUMIN 3.5 2.7* 2.2* 2.1* 1.9*     Lipase     Component Value Date/Time    LIPASE 23 11/09/2015 0450     Studies/Results: Dg Chest 2 View  Result Date: 11/08/2015 CLINICAL DATA:  Shortness of breath. Abdominal pain. Gallbladder and heart complications. EXAM: CHEST  2 VIEW COMPARISON:  11/10/2013 FINDINGS: Mild cardiac enlargement with central pulmonary vascular congestion. Mild interstitial pattern to the lungs likely represents early interstitial edema. Changes are new since prior study. Small bilateral pleural effusions. No pneumothorax. Calcified aorta. IMPRESSION: Cardiac enlargement with mild central pulmonary vascular congestion, interstitial edema, and small pleural effusions. Electronically Signed   By: Lucienne Capers M.D.   On: 11/08/2015 01:46   Ir Perc Cholecystostomy  Result Date: 11/08/2015 INDICATION: 65 year old male with acute acalculous cholecystitis EXAM: IR IMAGE GUIDED DRAINAGE BY PERCUTANEOUS CATHETER MEDICATIONS: The patient is currently admitted to the hospital and receiving intravenous antibiotics. The antibiotics were administered within an appropriate time frame prior to the initiation of the procedure. ANESTHESIA/SEDATION: Fentanyl 50 mcg IV; Versed 1.0 mg IV, 4 mg Zofran Moderate Sedation Time:  35 minutes The patient was continuously monitored during the procedure by the interventional radiology nurse under my direct supervision. COMPLICATIONS: None PROCEDURE: Informed written consent was obtained from the patient and the patient's family after a thorough discussion of the procedural risks, benefits and alternatives. All questions were addressed. Maximal Sterile Barrier Technique was utilized including caps, mask, sterile gowns, sterile gloves, sterile drape, hand hygiene and skin antiseptic.  A timeout was performed prior to the initiation of the procedure. Ultrasound survey of the right upper quadrant was performed for planning purposes. Once the patient is prepped and draped in the usual sterile fashion, the skin and subcutaneous tissues overlying  the gallbladder were generously infiltrated 1% lidocaine for local anesthesia. Using ultrasound guidance, attempt was made to guide Chiba needle through liver parenchyma into the gallbladder. The arrangement of the gallbladder in the right upper quadrant precluded a liver passage, given the overlying ribs. Access was through the dome of the gallbladder. With removal of the stylet, spontaneous dark bile drainage occurred. Using modified Seldinger technique, a 10 French drain was placed into the gallbladder fossa, with aspiration of the sample for the lab. Contrast injection confirmed position of the tube within the gallbladder lumen. Drainage catheter was attached to gravity drain with a suture retention placed. Patient tolerated the procedure well and remained hemodynamically stable throughout. No complications were encountered and no significant blood loss encountered. IMPRESSION: Status post percutaneous cholecystostomy. Sample of of bile was sent to the lab for analysis. Signed, Dulcy Fanny. Earleen Newport, DO [facility] Radiology Electronically Signed   By: Corrie Mckusick D.O.   On: 11/08/2015 18:06   Dg Abd Portable 1v  Result Date: 11/08/2015 CLINICAL DATA:  No bowel sounds EXAM: PORTABLE ABDOMEN - 1 VIEW COMPARISON:  11/08/2015 FINDINGS: There is normal small bowel gas pattern. A cholecystostomy catheter is noted in right lower quadrant. Moderate gas noted within transverse colon. IMPRESSION: Normal small bowel gas pattern. Moderate gas noted in transverse colon. Cholecystostomy catheter is noted in right upper quadrant. Electronically Signed   By: Lahoma Crocker M.D.   On: 11/08/2015 23:07    Medications: . amiodarone  200 mg Oral BID  . aspirin EC  81 mg Oral Daily  . atorvastatin  40 mg Oral q1800  . escitalopram  10 mg Oral Daily  . folic acid  1 mg Oral Daily  . insulin aspart  0-9 Units Subcutaneous Q4H  . levalbuterol  1.25 mg Nebulization TID  .  levothyroxine  100 mcg Oral QAC breakfast  . magic mouthwash  10 mL Oral TID  . metoprolol tartrate  50 mg Oral BID  . mometasone-formoterol  2 puff Inhalation BID  . pantoprazole  40 mg Oral Daily  . piperacillin-tazobactam (ZOSYN)  IV  3.375 g Intravenous Q8H  . potassium chloride  20 mEq Oral BID  . thiamine  100 mg Oral Daily   Or  . thiamine  100 mg Intravenous Daily   . Marland KitchenTPN (CLINIMIX-E) Adult    . heparin 2,150 Units/hr (11/08/15 2206)  . Marland KitchenTPN (CLINIMIX-E) Adult 40 mL/hr at 11/08/15 2017      AODM Chronic ETOH Chronic tobacco Hypertension   Assessment/Plan TX from [place], acute cholecystitis/CAD S/p NSTEMI/AF with RVR - CABG planned for next week S/p IR cholecystostomy tube placement 11/08/15 80-90% carotid stenosis by [place] -CTA head and neck this a.m. results pending FEN:  TNA/NPO ID:  Day 6 Zosyn DVT:  SCD/heparin drip  Plan: We'll follow with you    LOS: 2 days    JENNINGS,WILLARD 11/09/2015 [phone] I have seen and examined this patient and agree with the assessment and plan.   Matt B. Hassell Done, MD, [facility] [phone] beeper [phone]  11/09/2015 2:39 PM

## 2015-11-09 NOTE — Progress Notes (Signed)
Nutrition Follow-up  DOCUMENTATION CODES:   Obesity unspecified  INTERVENTION:  Provide Ensure Enlive po BID, each supplement provides 350 kcal and 20 grams of protein.  TPN per Pharmacy.  Encourage adequate PO intake.   RD to continue to monitor.   NUTRITION DIAGNOSIS:   Inadequate oral intake related to inability to eat as evidenced by NPO status; diet advanced; ongoing  GOAL:   Patient will meet greater than or equal to 90% of their needs; progressing  MONITOR:   Diet advancement, PO intake, Labs, Weight trends, I & O's  REASON FOR ASSESSMENT:   Consult New TPN/TNA  ASSESSMENT:   65 y.o. Male with h/o DM2, HTN, HLD, hypothyroidisim, and chronic alcohol abuse presented to [facility] on 11/04/15 with RUQ abdominal and was found to have acute cholecystitis on CT scan as well as new onset Afib with RVR with heart rates in the 180's bpm with troponin elevation of 0.27-->0.04 without chest pain.   Enteral feeds have NOT been attempted.  Plans to start clear liquids today and advance to full liquids once tolerated. Solid food diet to start tomorrow. Per MD, plan to wean off TPN within the next 48 hours. Per Pharmacy, plans for Clinimix E 5/20 at 83 ml/hr providing 1753 kcal (79% of minimum kcal needs) and 100 grams of protein (83% of minimum protein needs).  Pt had just finished eating his full liquid meal tray. Pt reported he only ate jello, applesauce, and drank some tea. Pt reports no abdominal pains during time of visit. PTA pt reports eating well with usual intake of 3 meals a day. Diet recall includes cereal and milk for breakfast, a sandwich for lunch, and a dinner of either a hamburger, chicken wings, or a sub. Pt reports usual body weight of ~207 lbs. Pt weight no significant weight loss per weight records. RD to order Ensure to aid in caloric and protein needs. Pt encouraged to eat his food at meals.   Pt with no observed significant fat or muscle mass loss.   Labs and  medications reviewed.   Diet Order:  TPN (CLINIMIX-E) Adult .TPN (CLINIMIX-E) Adult Diet full liquid Room service appropriate? Yes; Fluid consistency: Thin  Skin:  Reviewed, no issues  Last BM:  8/18  Height:   Ht Readings from Last 1 Encounters:  11/07/15 5' 7" (1.702 m)    Weight:   Wt Readings from Last 1 Encounters:  11/09/15 217 lb 6 oz (98.6 kg)    Ideal Body Weight:  67.2 kg  BMI:  Body mass index is 34.05 kg/m.  Estimated Nutritional Needs:   Kcal:  2200-2400  Protein:  120-130 gm  Fluid:  2.2-2.4 L  EDUCATION NEEDS:   No education needs identified at this time  Corrin Parker, MS, RD, LDN Pager # [phone] After hours/ weekend pager # [phone]

## 2015-11-09 NOTE — Progress Notes (Addendum)
PARENTERAL NUTRITION CONSULT NOTE - FOLLOW UP  Pharmacy Consult for tpn Indication: NPO, poor oral intake  Allergies  Allergen Reactions  . Bupropion Hcl     REACTION: Severe constipation, insomnia  . Penicillins Other (See Comments)    REACTION: Vomiting Has patient had a PCN reaction causing immediate rash, facial/tongue/throat swelling, SOB or lightheadedness with hypotension: Yes Has patient had a PCN reaction causing severe rash involving mucus membranes or skin necrosis: No Has patient had a PCN reaction that required hospitalization No Has patient had a PCN reaction occurring within the last 10 years: No If all of the above answers are "NO", then may proceed with Cephalosporin use.    Patient Measurements: Height: _0  (170.2 cm) Weight: 217 lb 6 oz (98.6 kg) IBW/kg (Calculated) : 66.1 Adjusted Body Weight: 79 kg Usual Weight: 94 kg on admit  Body mass index is 34.05 kg/m.  Vital Signs: Temp: 97.8 F (36.6 C) (08/19 0429) Temp Source: Oral (08/19 0429) BP: 146/68 (08/19 0600) Pulse Rate: 81 (08/19 0600) Intake/Output from previous day: 08/18 0701 - 08/19 0700 In: 2805.7 [I.V.:655.7; IV Piggyback:150] Out: 850 [Urine:850] Intake/Output from this shift: No intake/output data recorded.  Labs:  Recent Labs  11/08/15 0347 11/08/15 2025 11/09/15 0450  WBC 13.0* 9.5 15.3*  HGB 10.6* 10.9* 10.1*  HCT 30.6* 31.3* 29.2*  PLT 176 196 174  INR 1.15  --   --      Recent Labs  11/08/15 0347 11/08/15 2025 11/09/15 0450  NA 129* 131* 127*  K 3.6 3.8 4.8  CL 99* 100* 100*  CO2 21* 21* 20*  GLUCOSE 110* 115* 493*  BUN _1 CREATININE 1.29* 1.28* 1.15  CALCIUM 7.9* 8.0* 7.1*  MG 1.8  --  1.8  PHOS  --   --  4.2  PROT 5.5* 5.7* 4.8*  ALBUMIN 2.2* 2.1* 1.9*  AST 26 25 78*  ALT 33 32 61  ALKPHOS 163* 169* 137*  BILITOT 1.3* 1.4* 0.9  BILIDIR 0.5  --   --   IBILI 0.8  --   --   PREALBUMIN  --   --  10.1*  TRIG  --   --  437*   Estimated  Creatinine Clearance: 73.6 mL/min (by C-G formula based on SCr of 1.15 mg/dL).    Recent Labs  11/08/15 1107 11/08/15 1618 11/08/15 1949  GLUCAP 122* 107* 119*    Medical History: Past Medical History:  Diagnosis Date  . Acute renal failure ([place]) 11/06/2015   [name] 11/06/2015  . Anxiety   . Arthritis    "lower spine; fingers" (11/07/2015)  . Depression   . GERD (gastroesophageal reflux disease)   . High cholesterol   . Hypertension 10/17/2013  . Hyperthyroidism    "had it radiated in his '66s"  . Hypothyroidism   . New onset atrial fibrillation ([place]) 11/06/2015   [name] 11/07/2015  . NSTEMI (non-ST elevated myocardial infarction) ([place]) 11/07/2015  . Thyroid disease   . Type II diabetes mellitus ([facility])     Medications:  Scheduled:  . amiodarone  200 mg Oral BID  . aspirin EC  81 mg Oral Daily  . atorvastatin  40 mg Oral q1800  . escitalopram  10 mg Oral Daily  . folic acid  1 mg Oral Daily  . insulin aspart  0-9 Units Subcutaneous Q4H  . levalbuterol  1.25 mg Nebulization TID  . levothyroxine  100 mcg Oral QAC breakfast  . magic mouthwash  10 mL  Oral TID  . metoprolol tartrate  50 mg Oral BID  . mometasone-formoterol  2 puff Inhalation BID  . pantoprazole  40 mg Oral Daily  . piperacillin-tazobactam (ZOSYN)  IV  3.375 g Intravenous Q8H  . potassium chloride  20 mEq Oral BID  . thiamine  100 mg Oral Daily   Or  . thiamine  100 mg Intravenous Daily    Insulin Requirements in the past 24 hours:  0 units sensitive SSI  Admit: 65 y/o male who presented to [facility] on 8/14 with RUQ abdominal pain found to have acute cholecystitis and new onset Afib. He underwent a stress test for cardiac clearance which was abnormal therefore had a cath on 8/16 which showed severe 4 vessel CAD. He was transferred to [facility] for CABG evaluation. Cholecystectomy has been delayed. Since his admission at [facility] he has had poor oral intake. Enteral feeds have NOT been  attempted.  Assessment: Transferred to stepdown overnight s/p perc drain placement due to fever, increased HR, sepsis.  GI: intact; acute cholecystitis - surgery delayed due to severe CAD, IR placed perc drain 8/18, no output recorded, last BM 8/18, albumin 1.9 Endo: DM, CBGs mostly <150 (had a 210,493) on sensitive SSI ; hypothyroid - levothy Lytes: Na 127, CoCa 8.7, Mg 1.8, K 4.8 s/p 40 meq, Phos 4.2 Renal: AKI on CKD 2, SCr down to 1.15, ?UOP accurate Pulm: 2L [place] - dulera Cards: NSTEMI, severe 4 vessel CAD on cath - CABG Mon; new onset Afib - amio, metop AC/heme: on heparin drip for Afib, plan to transition to DOAC per TCTS Hepatobil: AST mild elevation, tbili wnl, alk phos 137<163, TG 437, Palb 10.1 Neuro: EtOH abuse - lexapro, MVI, thiamine ID:  Chills last night, Zosyn resumed, Tm 102.1, WBC up 15.3, GNR growing in bile cx  Best Practices: IV heparin  TPN Access: double lumen PICC 8/18 TPN start date: 8/18>>  Current Nutrition:  NPO except sips with medications  Nutritional Goals: per RD note 8/18 2200-2400 kCal, 120-130 grams of protein per day  Plan:  Change to Clinimix E 5/20 at 83 ml/hr. This provides 100 g of protein and 1753 kCals per day meeting 83% of minimum protein and 79% of minimal kCal needs Add MVI and TE in TPN  Stop IVFE for elevated TG Continue sensitive SSI q4h and adjust as needed Monitor TPN labs qMon/Thur   Renold Genta, PharmD, BCPS Clinical Pharmacist Phone for today - [place] - [phone] 11/09/2015 7:31 AM

## 2015-11-10 DIAGNOSIS — A4150 Gram-negative sepsis, unspecified: Secondary | ICD-10-CM

## 2015-11-10 DIAGNOSIS — I739 Peripheral vascular disease, unspecified: Secondary | ICD-10-CM

## 2015-11-10 DIAGNOSIS — E039 Hypothyroidism, unspecified: Secondary | ICD-10-CM

## 2015-11-10 DIAGNOSIS — I6529 Occlusion and stenosis of unspecified carotid artery: Secondary | ICD-10-CM

## 2015-11-10 DIAGNOSIS — E1159 Type 2 diabetes mellitus with other circulatory complications: Secondary | ICD-10-CM

## 2015-11-10 DIAGNOSIS — N182 Chronic kidney disease, stage 2 (mild): Secondary | ICD-10-CM

## 2015-11-10 DIAGNOSIS — E1165 Type 2 diabetes mellitus with hyperglycemia: Secondary | ICD-10-CM

## 2015-11-10 DIAGNOSIS — I779 Disorder of arteries and arterioles, unspecified: Secondary | ICD-10-CM

## 2015-11-10 DIAGNOSIS — I700 Atherosclerosis of aorta: Secondary | ICD-10-CM

## 2015-11-10 LAB — CBC
HCT: 27.1 % — ABNORMAL LOW (ref 39.0–52.0)
Hemoglobin: 9.1 g/dL — ABNORMAL LOW (ref 13.0–17.0)
MCH: 32.2 pg (ref 26.0–34.0)
MCHC: 33.6 g/dL (ref 30.0–36.0)
MCV: 95.8 fL (ref 78.0–100.0)
Platelets: 189 10*3/uL (ref 150–400)
RBC: 2.83 MIL/uL — ABNORMAL LOW (ref 4.22–5.81)
RDW: 13.5 % (ref 11.5–15.5)
WBC: 10.7 10*3/uL — ABNORMAL HIGH (ref 4.0–10.5)

## 2015-11-10 LAB — GLUCOSE, CAPILLARY
GLUCOSE-CAPILLARY: 236 mg/dL — AB (ref 65–99)
GLUCOSE-CAPILLARY: 236 mg/dL — AB (ref 65–99)
GLUCOSE-CAPILLARY: 164 mg/dL — AB (ref 65–99)
Glucose-Capillary: 130 mg/dL — ABNORMAL HIGH (ref 65–99)
Glucose-Capillary: 137 mg/dL — ABNORMAL HIGH (ref 65–99)
Glucose-Capillary: 196 mg/dL — ABNORMAL HIGH (ref 65–99)

## 2015-11-10 LAB — COMPREHENSIVE METABOLIC PANEL
ALT: 54 U/L (ref 17–63)
AST: 42 U/L — ABNORMAL HIGH (ref 15–41)
Albumin: 1.8 g/dL — ABNORMAL LOW (ref 3.5–5.0)
Alkaline Phosphatase: 148 U/L — ABNORMAL HIGH (ref 38–126)
Anion gap: 8 (ref 5–15)
BUN: 12 mg/dL (ref 6–20)
CO2: 26 mmol/L (ref 22–32)
Calcium: 7.9 mg/dL — ABNORMAL LOW (ref 8.9–10.3)
Chloride: 97 mmol/L — ABNORMAL LOW (ref 101–111)
Creatinine, Ser: 1.23 mg/dL (ref 0.61–1.24)
GFR calc Af Amer: >60 mL/min (ref >60)
GFR calc non Af Amer: >60 mL/min (ref >60)
Glucose, Bld: 235 mg/dL — ABNORMAL HIGH (ref 65–99)
Potassium: 3.8 mmol/L (ref 3.5–5.1)
Sodium: 131 mmol/L — ABNORMAL LOW (ref 135–145)
Total Bilirubin: 0.8 mg/dL (ref 0.3–1.2)
Total Protein: 5 g/dL — ABNORMAL LOW (ref 6.5–8.1)

## 2015-11-10 LAB — HEPARIN LEVEL (UNFRACTIONATED): Heparin Unfractionated: 0.62 IU/mL (ref 0.30–0.70)

## 2015-11-10 MED ORDER — INSULIN ASPART 100 UNIT/ML ~~LOC~~ SOLN
0.0000 [IU] | SUBCUTANEOUS | Status: DC
  Administered 2015-11-10: 3 [IU] via SUBCUTANEOUS

## 2015-11-10 MED ORDER — INSULIN ASPART 100 UNIT/ML ~~LOC~~ SOLN
0.0000 [IU] | Freq: Three times a day (TID) | SUBCUTANEOUS | Status: DC
  Administered 2015-11-11: 7 [IU] via SUBCUTANEOUS
  Administered 2015-11-11 – 2015-11-12 (×3): 4 [IU] via SUBCUTANEOUS
  Administered 2015-11-13 (×3): 3 [IU] via SUBCUTANEOUS

## 2015-11-10 MED ORDER — VANCOMYCIN HCL 10 G IV SOLR
1250.0000 mg | Freq: Two times a day (BID) | INTRAVENOUS | Status: DC
  Filled 2015-11-10: qty 1250

## 2015-11-10 MED ORDER — INSULIN ASPART 100 UNIT/ML ~~LOC~~ SOLN
3.0000 [IU] | Freq: Three times a day (TID) | SUBCUTANEOUS | Status: DC
  Administered 2015-11-10 – 2015-11-13 (×8): 3 [IU] via SUBCUTANEOUS

## 2015-11-10 MED ORDER — GLUCERNA SHAKE PO LIQD
237.0000 mL | Freq: Three times a day (TID) | ORAL | Status: DC
  Administered 2015-11-10 – 2015-11-13 (×6): 237 mL via ORAL

## 2015-11-10 MED ORDER — LEVALBUTEROL HCL 1.25 MG/0.5ML IN NEBU: 1.2500 mg | INHALATION_SOLUTION | Freq: Four times a day (QID) | RESPIRATORY_TRACT | Status: DC | PRN

## 2015-11-10 MED ORDER — TRACE MINERALS CR-CU-MN-SE-ZN 10-1000-500-60 MCG/ML IV SOLN
INTRAVENOUS | Status: DC
  Filled 2015-11-10: qty 1992

## 2015-11-10 MED ORDER — INSULIN ASPART 100 UNIT/ML ~~LOC~~ SOLN: 0.0000 [IU] | Freq: Three times a day (TID) | SUBCUTANEOUS | Status: DC

## 2015-11-10 NOTE — Progress Notes (Signed)
PARENTERAL NUTRITION CONSULT NOTE - FOLLOW UP  Pharmacy Consult for tpn Indication: NPO, poor oral intake  Allergies  Allergen Reactions  . Bupropion Hcl     REACTION: Severe constipation, insomnia  . Penicillins Other (See Comments)    REACTION: Vomiting Has patient had a PCN reaction causing immediate rash, facial/tongue/throat swelling, SOB or lightheadedness with hypotension: Yes Has patient had a PCN reaction causing severe rash involving mucus membranes or skin necrosis: No Has patient had a PCN reaction that required hospitalization No Has patient had a PCN reaction occurring within the last 10 years: No If all of the above answers are "NO", then may proceed with Cephalosporin use.    Patient Measurements: Height: 5' 7" (170.2 cm) Weight: 222 lb 0.1 oz (100.7 kg) IBW/kg (Calculated) : 66.1 Adjusted Body Weight: 79 kg Usual Weight: 94 kg on admit  Body mass index is 34.77 kg/m.  Vital Signs: Temp: 97.7 F (36.5 C) (08/20 0800) Temp Source: Oral (08/20 0800) BP: 157/63 (08/20 0800) Pulse Rate: 69 (08/20 0800) Intake/Output from previous day: 08/19 0701 - 08/20 0700 In: 1720 [I.V.:1420; IV Piggyback:300] Out: 160 [Drains:160] Intake/Output from this shift: No intake/output data recorded.  Labs:  Recent Labs  11/08/15 0347 11/08/15 2025 11/09/15 0450 11/10/15 0630  WBC 13.0* 9.5 15.3* 10.7*  HGB 10.6* 10.9* 10.1* 9.1*  HCT 30.6* 31.3* 29.2* 27.1*  PLT 176 196 174 189  INR 1.15  --   --   --      Recent Labs  11/08/15 0347 11/08/15 2025 11/09/15 0450 11/10/15 0630  NA 129* 131* 127* 131*  K 3.6 3.8 4.8 3.8  CL 99* 100* 100* 97*  CO2 21* 21* 20* 26  GLUCOSE 110* 115* 493* 235*  BUN _0 CREATININE 1.29* 1.28* 1.15 1.23  CALCIUM 7.9* 8.0* 7.1* 7.9*  MG 1.8  --  1.8  --   PHOS  --   --  4.2  --   PROT 5.5* 5.7* 4.8* 5.0*  ALBUMIN 2.2* 2.1* 1.9* 1.8*  AST 26 25 78* 42*  ALT 33 32 61 54  ALKPHOS 163* 169* 137* 148*  BILITOT 1.3* 1.4*  0.9 0.8  BILIDIR 0.5  --   --   --   IBILI 0.8  --   --   --   PREALBUMIN  --   --  10.1*  --   TRIG  --   --  437*  --    Estimated Creatinine Clearance: 69.5 mL/min (by C-G formula based on SCr of 1.23 mg/dL).    Recent Labs  11/09/15 2023 11/10/15 0058 11/10/15 0813  GLUCAP 219* 196* 236*   Medications:  Scheduled:  . amiodarone  200 mg Oral BID  . aspirin EC  81 mg Oral Daily  . atorvastatin  40 mg Oral q1800  . escitalopram  10 mg Oral Daily  . feeding supplement (ENSURE ENLIVE)  237 mL Oral BID BM  . folic acid  1 mg Oral Daily  . insulin aspart  0-15 Units Subcutaneous TID WC  . insulin aspart  0-5 Units Subcutaneous QHS  . levalbuterol  1.25 mg Nebulization TID  . levothyroxine  100 mcg Oral QAC breakfast  . magic mouthwash  10 mL Oral TID  . metoprolol tartrate  50 mg Oral BID  . mometasone-formoterol  2 puff Inhalation BID  . pantoprazole  40 mg Oral Daily  . piperacillin-tazobactam (ZOSYN)  IV  3.375 g Intravenous Q8H  . potassium chloride  20 mEq Oral BID  . thiamine  100 mg Oral Daily   Or  . thiamine  100 mg Intravenous Daily  . vancomycin  1,250 mg Intravenous Q12H    Insulin Requirements in the past 24 hours:  10 units moderate SSI  Admit: 65 y/o male who presented to [facility] on 8/14 with RUQ abdominal pain found to have acute cholecystitis and new onset Afib. He underwent a stress test for cardiac clearance which was abnormal therefore had a cath on 8/16 which showed severe 4 vessel CAD. He was transferred [facility] for [facility]G evaluation. Cholecystectomy has been delayed. Since his admission at [facility] he has had poor oral intake. Enteral feeds have NOT been attempted.  Assessment: Clear/full liquids started yesterday and tolerated well, has had one Ensure since ordered, advancing diet to soft solids. CBGs uncontrolled and exacerbated by TPN - will change to product with less dextrose but less calories  GI: intact; acute cholecystitis - surgery delayed due to  severe CAD, IR placed perc drain 8/18, 160 ml output, last BM 8/19, albumin 1.8 Endo: DM, CBGs 176-235 (serum 493) on moderate SSI; hypothyroid - levothy Lytes: Na 131, CoCa 8.7, Mg 1.8, K 3.8, Phos 4.2, CoCa 9.6 Renal: AKI on CKD 2, SCr 1.23, ?UOP accurate Pulm: 2L [place] - dulera Cards: NSTEMI, severe 4 vessel CAD on cath - CABG Mon; new onset Afib - amio, metop AC/heme: on heparin drip for Afib, plan to transition to DOAC per TCTS Hepatobil: LFTs wnl, tbili wnl, alk phos 148>137, TG 437, Palb 10.1 Neuro: EtOH abuse - lexapro, MVI, thiamine; 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 This provides 100 g of protein and 1414 kCals per day meeting 83% of minimum protein and 64% of minimal kCal needs Add MVI and TE in TPN  IVFE held for elevated TG Change to resisant SSI q4h and adjust as needed Monitor TPN labs qMon/Thur   Jennifer Maramec, PharmD, BCPS Clinical Pharmacist Phone for today - [phone] Main pharmacy - [phone] 11/10/2015 8:56 AM   

## 2015-11-10 NOTE — Progress Notes (Signed)
Flushed gallbladder drain with 5cc of sterile normal saline.

## 2015-11-10 NOTE — Progress Notes (Signed)
Daily Progress Note  Assessment/Planning: Acute cholecystitis, Multi-vessel CAD, asx L ICA stenosis >90%, R ICA stenosis <80%   Discussed with the patient CTA findings  Will discuss with Dr. Darcey Nora operative plan: literature would support either staged approach vs combined approach   Subjective   No events overnight, no complaints  Objective Vitals:   11/10/15 0400 11/10/15 0633 11/10/15 0700 11/10/15 0800  BP:  (!) 153/60 (!) 157/67 (!) 157/63  Pulse:  68 67 69  Resp:  _0 Temp:  98.2 F (36.8 C)  97.7 F (36.5 C)  TempSrc:  Axillary  Oral  SpO2:  95% 96% 95%  Weight: 222 lb 0.1 oz (100.7 kg)     Height:        Intake/Output Summary (Last 24 hours) at 11/10/15 0858 Last data filed at 11/10/15 0500  Gross per 24 hour  Intake          1719.95 ml  Output              160 ml  Net          1559.95 ml   VASC Palpable B carotid pulses without bruits  NEURO CN grossly intact, sym motor 5/5  Laboratory CBC    Component Value Date/Time   WBC 10.7 (H) 11/10/2015 0630   HGB 9.1 (L) 11/10/2015 0630   HCT 27.1 (L) 11/10/2015 0630   PLT 189 11/10/2015 0630    BMET    Component Value Date/Time   NA 131 (L) 11/10/2015 0630   K 3.8 11/10/2015 0630   CL 97 (L) 11/10/2015 0630   CO2 26 11/10/2015 0630   GLUCOSE 235 (H) 11/10/2015 0630   BUN 12 11/10/2015 0630   CREATININE 1.23 11/10/2015 0630   CALCIUM 7.9 (L) 11/10/2015 0630   GFRNONAA >60 11/10/2015 0630   GFRAA >60 11/10/2015 0630    Adele Barthel, MD [facility] Office: [phone] Pager: [phone]  11/10/2015, 8:58 AM

## 2015-11-10 NOTE — Progress Notes (Signed)
Patient Name: Brian Casey Date of Encounter: 11/10/2015     Principal Problem:   Sepsis due to Gram negative bacteria ([place]) Active Problems:   Hypothyroidism   Diabetes mellitus type 2, uncontrolled ([place])   Anxiety state   NICOTINE ADDICTION   HLD (hyperlipidemia)   Essential hypertension   Cholecystitis, acute   PAF (paroxysmal atrial fibrillation) (HCC)   AKI (acute kidney injury) ([place])   Aortic atherosclerosis (HCC)   NSTEMI (non-ST elevated myocardial infarction) ([place])   Coronary artery disease involving native coronary artery of native heart with angina pectoris (HCC)   Carotid stenosis   CKD (chronic kidney disease), stage II   Protein-calorie malnutrition, severe (HCC)   Alcohol abuse   Left ventricular diastolic dysfunction/grade 2    SUBJECTIVE  No further pain, drain in place, drinking ginger ale. Apple sauce.    CURRENT MEDS . amiodarone  200 mg Oral BID  . aspirin EC  81 mg Oral Daily  . atorvastatin  40 mg Oral q1800  . escitalopram  10 mg Oral Daily  . feeding supplement (ENSURE ENLIVE)  237 mL Oral BID BM  . folic acid  1 mg Oral Daily  . insulin aspart  0-15 Units Subcutaneous TID WC  . insulin aspart  0-5 Units Subcutaneous QHS  . levothyroxine  100 mcg Oral QAC breakfast  . magic mouthwash  10 mL Oral TID  . metoprolol tartrate  50 mg Oral BID  . mometasone-formoterol  2 puff Inhalation BID  . pantoprazole  40 mg Oral Daily  . piperacillin-tazobactam (ZOSYN)  IV  3.375 g Intravenous Q8H  . potassium chloride  20 mEq Oral BID  . thiamine  100 mg Oral Daily   Or  . thiamine  100 mg Intravenous Daily  . vancomycin  1,250 mg Intravenous Q12H    OBJECTIVE  Vitals:   11/10/15 0400 11/10/15 0633 11/10/15 0700 11/10/15 0800  BP:  (!) 153/60 (!) 157/67 (!) 157/63  Pulse:  68 67 69  Resp:  _0 Temp:  98.2 F (36.8 C)  97.7 F (36.5 C)  TempSrc:  Axillary  Oral  SpO2:  95% 96% 95%  Weight: 222 lb 0.1 oz (100.7 kg)     Height:          Intake/Output Summary (Last 24 hours) at 11/10/15 1058 Last data filed at 11/10/15 0956  Gross per 24 hour  Intake          2199.95 ml  Output              160 ml  Net          2039.95 ml   Filed Weights   11/08/15 0607 11/09/15 0328 11/10/15 0400  Weight: 216 lb 8 oz (98.2 kg) 217 lb 6 oz (98.6 kg) 222 lb 0.1 oz (100.7 kg)    PHYSICAL EXAM  General: Pleasant, NAD. Neuro: Alert and oriented X 3. Moves all extremities spontaneously. Psych: Normal affect. HEENT:  Normal  Neck: Supple without bruits or JVD. Lungs:  Resp regular and unlabored, CTA. Heart: RRR no s3, s4, or murmurs. Obese Abdomen: Soft, non-tender, non-distended, BS + x 4. Perc tube Extremities: No clubbing, cyanosis or edema. DP/PT/Radials 2+ and equal bilaterally.  Accessory Clinical Findings  CBC  Recent Labs  11/08/15 0347  11/09/15 0450 11/10/15 0630  WBC 13.0*  < > 15.3* 10.7*  NEUTROABS 11.4*  --  14.1*  --   HGB 10.6*  < > 10.1*  9.1*  HCT 30.6*  < > 29.2* 27.1*  MCV 94.2  < > 97.3 95.8  PLT 176  < > 174 189  < > = values in this interval not displayed. Basic Metabolic Panel  Recent Labs  11/08/15 0347  11/09/15 0450 11/10/15 0630  NA 129*  < > 127* 131*  K 3.6  < > 4.8 3.8  CL 99*  < > 100* 97*  CO2 21*  < > 20* 26  GLUCOSE 110*  < > 493* 235*  BUN 11  < > 11 12  CREATININE 1.29*  < > 1.15 1.23  CALCIUM 7.9*  < > 7.1* 7.9*  MG 1.8  --  1.8  --   PHOS  --   --  4.2  --   < > = values in this interval not displayed. Liver Function Tests  Recent Labs  11/09/15 0450 11/10/15 0630  AST 78* 42*  ALT 61 54  ALKPHOS 137* 148*  BILITOT 0.9 0.8  PROT 4.8* 5.0*  ALBUMIN 1.9* 1.8*     TELE  NSR with occasional Bursts of paroxysmal atrial tachycardia. Personally viewed.  Radiology/Studies  Ct Abdomen Pelvis Wo Contrast  Result Date: 11/04/2015 CLINICAL DATA:  Right lower quadrant pain extending into the right upper and central abdomen. Symptoms for 2 days. History of hernia  repair. EXAM: CT ABDOMEN AND PELVIS WITHOUT CONTRAST TECHNIQUE: Multidetector CT imaging of the abdomen and pelvis was performed following the standard protocol without IV contrast. COMPARISON:  None. FINDINGS: Lower chest: Mild dependent airspace opacities at both lung bases, probably atelectasis. No significant pleural or pericardial effusion. Prominent coronary artery atherosclerosis noted. There is a small hiatal hernia. Hepatobiliary: The hepatic density is diffusely decreased, consistent with steatosis. No focal hepatic abnormalities are identified on noncontrast imaging. The gallbladder is distended with wall thickening and prominent surrounding inflammatory changes. No calcified gallstones are seen. There is no significant biliary dilatation. Pancreas: Unremarkable. No pancreatic ductal dilatation or surrounding inflammatory changes. Spleen: Normal in size without focal abnormality. Adrenals/Urinary Tract: Both adrenal glands appear normal. Both kidneys demonstrate mild cortical thinning and symmetric perinephric soft tissue stranding. 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 No evidence of renal mass, hydronephrosis or urinary tract calculus. The bladder appears unremarkable. Stomach/Bowel: No evidence bowel wall thickening or distention. The right upper quadrant inflammatory changes do not appear to arise from the duodenum. The appendix appears normal. There is mild distal colonic diverticulosis. Vascular/Lymphatic: There are no enlarged abdominal or pelvic lymph nodes. Aortic and branch vessel atherosclerosis noted. Vascular assessment limited without contrast. Reproductive: There is a focal parenchymal calcification within the right aspect of the prostate gland which is not significantly enlarged. The seminal vesicles appear normal. Other: No evidence of abdominal wall mass or hernia. Musculoskeletal: No acute or significant osseous findings. IMPRESSION: 1. Distended  gallbladder with wall thickening and surrounding inflammation highly suspicious for acute cholecystitis. Correlate clinically. No calcified gallstones or biliary dilatation identified. 2. No other acute findings are seen.  The appendix appears normal. 3. Bibasilar atelectasis, hepatic steatosis and mild distal colonic diverticulosis. 4.  Aortic Atherosclerosis (ICD10-170.0) Electronically Signed   By: Richardean Sale M.D.   On: 11/04/2015 10:17   Ct Angio Head W Or Wo Contrast  Result Date: 11/09/2015 CLINICAL DATA:  Noninvasive testing discovered BILATERAL carotid stenosis. Coronary artery disease. No neurologic symptoms. EXAM: CT ANGIOGRAPHY HEAD AND NECK TECHNIQUE: Multidetector CT imaging of the head and neck was performed  using the standard protocol during bolus administration of intravenous contrast. Multiplanar CT image reconstructions and MIPs were obtained to evaluate the vascular anatomy. Carotid stenosis measurements (when applicable) are obtained utilizing NASCET criteria, using the distal internal carotid diameter as the denominator. CONTRAST:  50 mL Isovue 370. COMPARISON:  None. FINDINGS: CT HEAD Calvarium and skull base: No fracture or destructive lesion. Mastoids and middle ears are grossly clear. Paranasal sinuses: Layering fluid RIGHT greater than LEFT maxillary sinuses. Orbits: Negative. Brain: No evidence of acute abnormality, including acute infarct, hemorrhage, hydrocephalus, or mass lesion. Mild atrophy, slightly premature for age. Slight hypoattenuation of white matter representing chronic microvascular ischemic change. CTA NECK Aortic arch: Standard branching. Imaged portion shows no evidence of aneurysm or dissection. No significant stenosis of the major arch vessel origins. Right carotid system: Moderate calcific and noncalcified plaque proximal RIGHT common carotid artery, non stenotic. Irregular calcified and noncalcified plaque at the bifurcation, RIGHT ICA origin, 60-70% stenosis  based on luminal measurements 1.9/5.6 proximal/distal. No evidence of dissection, or occlusion. Left carotid system: Non stenotic atheromatous change in the proximal LEFT common carotid artery. Irregular calcific and noncalcified plaque at the LEFT ICA origin, 90% or greater stenosis, based on luminal measurements of 0.6/4.1 proximal/distal. Some apparent reduction in luminal caliber of the cervical LEFT ICA could underestimate the degree of stenosis. No evidence of dissection, or occlusion. Vertebral arteries: Codominant. Calcified plaque at the LEFT vertebral origin estimated 50% stenosis. No evidence of dissection, or occlusion. Nonvascular soft tissues: BILATERAL pleural effusions. BILATERAL airspace opacities, correlate clinically for pneumonia versus edema, greater in the LEFT upper lobe. No neck masses. Airway midline. Central venous catheter without pneumothorax. Cervical spondylosis. In CTA HEAD Anterior circulation: Calcific plaque in the cavernous and supraclinoid RIGHT ICA segments, non stenotic. 50% tandem stenoses due to calcific plaque in the inferior and superior cavernous segments of the LEFT ICA. BILATERAL ICA supraclinoid and termini segments widely patent. No M1 stenosis or occlusion. Both anterior cerebral arteries unremarkable. No significant MCA branch stenosis or occlusion. No visible aneurysm or vascular malformation. Posterior circulation: Both vertebral arteries contribute to formation of basilar which is widely patent. No cerebellar branch occlusion or proximal PCA disease. No aneurysm, or vascular malformation. Venous sinuses: As permitted by contrast timing, patent. Anatomic variants: None of significance. Delayed phase:   No abnormal intracranial enhancement. IMPRESSION: Significant calcific and noncalcified plaque at the LEFT ICA origin, luminal diameter of less than 1 mm, estimated 90% stenosis or greater. Similar less severe calcific and noncalcified plaque, RIGHT ICA origin,  estimated 60-70% stenosis. Tandem 50% stenoses cavernous LEFT ICA, but no other intracranial atheromatous change of significance. No posterior circulation disease of significance. BILATERAL pleural effusions, and BILATERAL LEFT greater than RIGHT airspace opacities of uncertain significance. Correlate clinically for pneumonia versus edema. Electronically Signed   By: Staci Righter M.D.   On: 11/09/2015 11:30   Dg Chest 2 View  Result Date: 11/08/2015 CLINICAL DATA:  Shortness of breath. Abdominal pain. Gallbladder and heart complications. EXAM: CHEST  2 VIEW COMPARISON:  11/10/2013 FINDINGS: Mild cardiac enlargement with central pulmonary vascular congestion. Mild interstitial pattern to the lungs likely represents early interstitial edema. Changes are new since prior study. Small bilateral pleural effusions. No pneumothorax. Calcified aorta. IMPRESSION: Cardiac enlargement with mild central pulmonary vascular congestion, interstitial edema, and small pleural effusions. Electronically Signed   By: Lucienne Capers M.D.   On: 11/08/2015 01:46   Ct Angio Neck W Or Wo Contrast  Result Date: 11/09/2015 CLINICAL DATA:  Noninvasive testing discovered BILATERAL carotid stenosis. Coronary artery disease. No neurologic symptoms. EXAM: CT ANGIOGRAPHY HEAD AND NECK TECHNIQUE: Multidetector CT imaging of the head and neck was performed using the standard protocol during bolus administration of intravenous contrast. Multiplanar CT image reconstructions and MIPs were obtained to evaluate the vascular anatomy. Carotid stenosis measurements (when applicable) are obtained utilizing NASCET criteria, using the distal internal carotid diameter as the denominator. CONTRAST:  50 mL Isovue 370. COMPARISON:  None. FINDINGS: CT HEAD Calvarium and skull base: No fracture or destructive lesion. Mastoids and middle ears are grossly clear. Paranasal sinuses: Layering fluid RIGHT greater than LEFT maxillary sinuses. Orbits: Negative.  Brain: No evidence of acute abnormality, including acute infarct, hemorrhage, hydrocephalus, or mass lesion. Mild atrophy, slightly premature for age. Slight hypoattenuation of white matter representing chronic microvascular ischemic change. CTA NECK Aortic arch: Standard branching. Imaged portion shows no evidence of aneurysm or dissection. No significant stenosis of the major arch vessel origins. Right carotid system: Moderate calcific and noncalcified plaque proximal RIGHT common carotid artery, non stenotic. Irregular calcified and noncalcified plaque at the bifurcation, RIGHT ICA origin, 60-70% stenosis based on luminal measurements 1.9/5.6 proximal/distal. No evidence of dissection, or occlusion. Left carotid system: Non stenotic atheromatous change in the proximal LEFT common carotid artery. Irregular calcific and noncalcified plaque at the LEFT ICA origin, 90% or greater stenosis, based on luminal measurements of 0.6/4.1 proximal/distal. Some apparent reduction in luminal caliber of the cervical LEFT ICA could underestimate the degree of stenosis. No evidence of dissection, or occlusion. Vertebral arteries: Codominant. Calcified plaque at the LEFT vertebral origin estimated 50% stenosis. No evidence of dissection, or occlusion. Nonvascular soft tissues: BILATERAL pleural effusions. BILATERAL airspace opacities, correlate clinically for pneumonia versus edema, greater in the LEFT upper lobe. No neck masses. Airway midline. Central venous catheter without pneumothorax. Cervical spondylosis. In CTA HEAD Anterior circulation: Calcific plaque in the cavernous and supraclinoid RIGHT ICA segments, non stenotic. 50% tandem stenoses due to calcific plaque in the inferior and superior cavernous segments of the LEFT ICA. BILATERAL ICA supraclinoid and termini segments widely patent. No M1 stenosis or occlusion. Both anterior cerebral arteries unremarkable. No significant MCA branch stenosis or occlusion. No visible  aneurysm or vascular malformation. Posterior circulation: Both vertebral arteries contribute to formation of basilar which is widely patent. No cerebellar branch occlusion or proximal PCA disease. No aneurysm, or vascular malformation. Venous sinuses: As permitted by contrast timing, patent. Anatomic variants: None of significance. Delayed phase:   No abnormal intracranial enhancement. IMPRESSION: Significant calcific and noncalcified plaque at the LEFT ICA origin, luminal diameter of less than 1 mm, estimated 90% stenosis or greater. Similar less severe calcific and noncalcified plaque, RIGHT ICA origin, estimated 60-70% stenosis. Tandem 50% stenoses cavernous LEFT ICA, but no other intracranial atheromatous change of significance. No posterior circulation disease of significance. BILATERAL pleural effusions, and BILATERAL LEFT greater than RIGHT airspace opacities of uncertain significance. Correlate clinically for pneumonia versus edema. Electronically Signed   By: Staci Righter M.D.   On: 11/09/2015 11:30   US Renal  Result Date: 11/04/2015 CLINICAL DATA:  Acute renal failure. EXAM: RENAL / URINARY TRACT ULTRASOUND COMPLETE COMPARISON:  None. FINDINGS: Right Kidney: Length: 12.4 cm. Echogenicity within normal limits. No mass or hydronephrosis visualized. Left Kidney: Length: 11.1 cm. Echogenicity within normal limits. No mass or hydronephrosis visualized. Bladder: Appears normal for degree of bladder distention. IMPRESSION: Negative. Electronically Signed   By: Staci Righter M.D.   On: 11/04/2015 17:31  Nm Myocar Multi W/spect W/wall Motion / Ef  Result Date: 11/06/2015 Pharmacological myocardial perfusion imaging study with moderate sized region of moderate intensity ischemia in the lateral wall Normal wall motion, EF estimated at 68% No EKG changes concerning for ischemia at peak stress or in recovery. High risk scan given lateral wall ischemia, given recent troponin elevation Signed, Esmond Plants, MD,  Ph.D [facility]   Ir Perc Cholecystostomy  Result Date: 11/08/2015 INDICATION: 65 year old male with acute acalculous cholecystitis EXAM: IR IMAGE GUIDED DRAINAGE BY PERCUTANEOUS CATHETER MEDICATIONS: The patient is currently admitted to the hospital and receiving intravenous antibiotics. The antibiotics were administered within an appropriate time frame prior to the initiation of the procedure. ANESTHESIA/SEDATION: Fentanyl 50 mcg IV; Versed 1.0 mg IV, 4 mg Zofran Moderate Sedation Time:  35 minutes The patient was continuously monitored during the procedure by the interventional radiology nurse under my direct supervision. COMPLICATIONS: None PROCEDURE: Informed written consent was obtained from the patient and the patient's family after a thorough discussion of the procedural risks, benefits and alternatives. All questions were addressed. Maximal Sterile Barrier Technique was utilized including caps, mask, sterile gowns, sterile gloves, sterile drape, hand hygiene and skin antiseptic. A timeout was performed prior to the initiation of the procedure. Ultrasound survey of the right upper quadrant was performed for planning purposes. Once the patient is prepped and draped in the usual sterile fashion, the skin and subcutaneous tissues overlying the gallbladder were generously infiltrated 1% lidocaine for local anesthesia. Using ultrasound guidance, attempt was made to guide Chiba needle through liver parenchyma into the gallbladder. The arrangement of the gallbladder in the right upper quadrant precluded a liver passage, given the overlying ribs. Access was through the dome of the gallbladder. With removal of the stylet, spontaneous dark bile drainage occurred. Using modified Seldinger technique, a 10 French drain was placed into the gallbladder fossa, with aspiration of the sample for the lab. Contrast injection confirmed position of the tube within the gallbladder lumen. Drainage catheter was attached to  gravity drain with a suture retention placed. Patient tolerated the procedure well and remained hemodynamically stable throughout. No complications were encountered and no significant blood loss encountered. IMPRESSION: Status post percutaneous cholecystostomy. Sample of of bile was sent to the lab for analysis. Signed, Dulcy Fanny. Earleen Newport, DO [facility] Electronically Signed   By: Corrie Mckusick D.O.   On: 11/08/2015 18:06   Dg Abd Portable 1v  Result Date: 11/08/2015 CLINICAL DATA:  No bowel sounds EXAM: PORTABLE ABDOMEN - 1 VIEW COMPARISON:  11/08/2015 FINDINGS: There is normal small bowel gas pattern. A cholecystostomy catheter is noted in right lower quadrant. Moderate gas noted within transverse colon. IMPRESSION: Normal small bowel gas pattern. Moderate gas noted in transverse colon. Cholecystostomy catheter is noted in right upper quadrant. Electronically Signed   By: Lahoma Crocker M.D.   On: 11/08/2015 23:07   US Abdomen Limited Ruq  Result Date: 11/04/2015 CLINICAL DATA:  Right-sided abdominal pain for 2 days.  Abnormal CT. EXAM: US ABDOMEN LIMITED - RIGHT UPPER QUADRANT COMPARISON:  CT 11/04/2015. FINDINGS: Gallbladder: Gallbladder wall thickening to 7 mm is noted at the gallbladder fundus. Gallbladder sludge is present. No discrete gallstones are seen. Sonographic Percell Miller sign is absent according to the sonographer. Common bile duct: Diameter: 5 mm. Liver: Increased echogenicity corresponding with steatosis on CT. No focal hepatic abnormalities identified. IMPRESSION: 1. Gallbladder wall thickening and sludge without gallstones, sonographic Murphy sign or biliary dilatation. 2. I  reviewed the CT from earlier today, and the inflammatory changes on that study are in the right upper quadrant with the epicenter at the gallbladder. In conjunction with that study, findings still remain concerning for acalculous cholecystitis. The patient's appendix  does lie in the right upper quadrant adjacent to the gallbladder, but appears normal. There is no apparent wall thickening of the duodenum or colon. The pancreas appears normal, and the patient does not have an elevated lipase level, although does have leukocytosis. Electronically Signed   By: Richardean Sale M.D.   On: 11/04/2015 11:35   Procedures  Left Heart Cath and Coronary Angiography  Conclusion   Dist RCA lesion, 95 %stenosed.  Ramus lesion, 90 %stenosed.  Ost Cx lesion, 95 %stenosed.  Mid LAD lesion, 70 %stenosed.  Dist LAD lesion, 60 %stenosed.  Ost LM to LM lesion, 40 %stenosed.      2D ECHO: 11/04/2015 LV EF: 50% -   55% Study Conclusions - Left ventricle: The cavity size was normal. Systolic function was   normal. The estimated ejection fraction was in the range of 50%   to 55%. Wall motion was normal; there were no regional wall   motion abnormalities. Features are consistent with a pseudonormal   left ventricular filling pattern, with concomitant abnormal   relaxation and increased filling pressure (grade 2 diastolic   dysfunction). - Left atrium: The atrium was at the upper limits of normal in   size. - Right ventricle: Systolic function was normal. - Pulmonary arteries: Systolic pressure was within the normal   range. Impressions: - Rhythm is normal sinus.   ASSESSMENT AND PLAN 65 y.o. male with h/o DM2, HTN, HLD, hypothyroidisim, and chronic alcohol abuse presented to [facility] on 11/04/15 with RUQ abdominal and was found to have acute cholecystitis on CT scan as well as new onset Afib with RVR with heart rates in the 180's bpm. He underwent myoview for pre op risk stratification which was high risk and subsequent LHC showing severe 4VCAD. He was transferred from [facility] to [facility] on 11/07/15 for evaluation by [facility]. Dr. Darcey Nora plans on surgery later this week.    Severe multivessel CAD: severe 4V CAD. Plan is for CABG later this week. Continue ASA, BB and statin.     B carotid stenosis: Dr. Bridgett Larsson has evaluated and will discuss about potential staged procedure versus combined approach.   CT angiogram: Significant calcific and noncalcified plaque at the   LEFT ICA origin,luminal diameter of less than 1 mm, estimated 90% stenosis or greater.  Similar less severe calcific and noncalcified plaque,  RIGHT ICA origin, estimated 60-70% stenosis.  Tandem 50% stenoses cavernous LEFT ICA, but no other intracranial atheromatous change of significance.  No posterior circulation disease of significance.  BILATERAL pleural effusions, and BILATERAL LEFT greater than RIGHT airspace opacities of uncertain significance.   PAF: was in afib with RVR on admission and placed on amio gtt. He has had short bursts of RVR since.  He has since converted to NSR and now on PO amiodarone 275m BID. Lopressor to 571mBID.  -CHADS2VASc at least 3 (HTN, DM, vascular disease) -Heparin gtt, transition to DOTownerost op   -Telemetry now shows brief paroxysmal atrial tachycardia. Mostly sinus rhythm. -Mildly anemic. Watch with heparin.  Cholecystitis: surgery deferred at this time. Continue Zosyn. Also on vancomycin which may be able to be stopped if no evidence of gram-positive activity.  PICC line for TPN. Surgery placed perc tube for drainage. Feels better. Currently tolerating  clears fairly well. Appreciate hospitalist consultation with Erin Hearing, NP (family also appreciated as well).   Depression/anxiety: psych saw at [facility] and he was started Lexapro  HTN:  lopressor 41m BID  HLD: continue Lipitor. Last lipid panel 10/2014 and LDL 88  DM: HgA1c 6.4, hyperglycemia likely exacerbated by parenteral nutrition. Appreciate TRH guidance.  Pain: Much better controlled, appreciate TRH.  Signed, MCandee FurbishMD

## 2015-11-10 NOTE — Progress Notes (Signed)
ANTICOAGULATION CONSULT NOTE - Follow Up Consult  Pharmacy Consult for Heparin Indication: atrial fibrillation and multivessel disease  Allergies  Allergen Reactions  . Bupropion Hcl     REACTION: Severe constipation, insomnia  . Penicillins Other (See Comments)    REACTION: Vomiting Has patient had a PCN reaction causing immediate rash, facial/tongue/throat swelling, SOB or lightheadedness with hypotension: Yes Has patient had a PCN reaction causing severe rash involving mucus membranes or skin necrosis: No Has patient had a PCN reaction that required hospitalization No Has patient had a PCN reaction occurring within the last 10 years: No If all of the above answers are "NO", then may proceed with Cephalosporin use.    Patient Measurements: Height: 5' 7" (170.2 cm) Weight: 222 lb 0.1 oz (100.7 kg) IBW/kg (Calculated) : 66.1 Heparin Dosing Weight: 86.7 kg  Vital Signs: Temp: 97.7 F (36.5 C) (08/20 0800) Temp Source: Oral (08/20 0800) BP: 157/63 (08/20 0800) Pulse Rate: 69 (08/20 0800)  Labs:  Recent Labs  11/08/15 0347 11/08/15 2025 11/09/15 0450 11/09/15 0500 11/10/15 0630  HGB 10.6* 10.9* 10.1*  --  9.1*  HCT 30.6* 31.3* 29.2*  --  27.1*  PLT 176 196 174  --  189  LABPROT 14.8  --   --   --   --   INR 1.15  --   --   --   --   HEPARINUNFRC 0.26*  --   --  0.37 0.62  CREATININE 1.29* 1.28* 1.15  --  1.23    Estimated Creatinine Clearance: 69.5 mL/min (by C-G formula based on SCr of 1.23 mg/dL).   Assessment: 65 y.o.malewith new onset afib + multivessel disease. Pharmacy has been consulted to dose heparin. No anticoagulation PTA, Hgb low stable, Plt wnl, no bleeding documented.  HL therapeutic today at 0.62   Goal of Therapy:  Heparin level 0.3-0.7 units/ml Monitor platelets by anticoagulation protocol: Yes   Plan:  Continue heparin at 2150 units/hr Daily HL/CBC Monitor s/sx bleeding   Gwenlyn Perking, PharmD PGY1 Pharmacy Resident Pager:  [phone] 11/10/2015 8:43 AM   I discussed / reviewed the pharmacy note by Dr. Karenann Cai and I agree with the resident's findings and plans as documented.  [employer], Pharm.D., BCPS Clinical Pharmacist Pager [phone] 11/10/2015 9:51 AM

## 2015-11-10 NOTE — Progress Notes (Signed)
Subjective: Pt tol CLD well No pain, no nausea  Objective: Vital signs in last 24 hours: Temp:  [97.7 F (36.5 C)-99.7 F (37.6 C)] 97.7 F (36.5 C) (08/20 0800) Pulse Rate:  [67-104] 69 (08/20 0800) Resp:  [19-24] 20 (08/20 0800) BP: (147-164)/(60-71) 157/63 (08/20 0800) SpO2:  [93 %-96 %] 95 % (08/20 0800) Weight:  [100.7 kg (222 lb 0.1 oz)] 100.7 kg (222 lb 0.1 oz) (08/20 0400) Last BM Date: 11/09/15  Intake/Output from previous day: 08/19 0701 - 08/20 0700 In: 1720 [I.V.:1420; IV Piggyback:300] Out: 160 [Drains:160] Intake/Output this shift: No intake/output data recorded.  General appearance: alert and cooperative GI: soft, non-tender; bowel sounds normal; no masses,  no organomegaly and drain dark bilious output  Lab Results:   Recent Labs  11/09/15 0450 11/10/15 0630  WBC 15.3* 10.7*  HGB 10.1* 9.1*  HCT 29.2* 27.1*  PLT 174 189   BMET  Recent Labs  11/09/15 0450 11/10/15 0630  NA 127* 131*  K 4.8 3.8  CL 100* 97*  CO2 20* 26  GLUCOSE 493* 235*  BUN 11 12  CREATININE 1.15 1.23  CALCIUM 7.1* 7.9*   PT/INR  Recent Labs  11/08/15 0347  LABPROT 14.8  INR 1.15   ABG No results for input(s): PHART, HCO3 in the last 72 hours.  Invalid input(s): PCO2, PO2  Studies/Results: Ct Angio Head W Or Wo Contrast  Result Date: 11/09/2015 CLINICAL DATA:  Noninvasive testing discovered BILATERAL carotid stenosis. Coronary artery disease. No neurologic symptoms. EXAM: CT ANGIOGRAPHY HEAD AND NECK TECHNIQUE: Multidetector CT imaging of the head and neck was performed using the standard protocol during bolus administration of intravenous contrast. Multiplanar CT image reconstructions and MIPs were obtained to evaluate the vascular anatomy. Carotid stenosis measurements (when applicable) are obtained utilizing NASCET criteria, using the distal internal carotid diameter as the denominator. CONTRAST:  50 mL Isovue 370. COMPARISON:  None. FINDINGS: CT HEAD  Calvarium and skull base: No fracture or destructive lesion. Mastoids and middle ears are grossly clear. Paranasal sinuses: Layering fluid RIGHT greater than LEFT maxillary sinuses. Orbits: Negative. Brain: No evidence of acute abnormality, including acute infarct, hemorrhage, hydrocephalus, or mass lesion. Mild atrophy, slightly premature for age. Slight hypoattenuation of white matter representing chronic microvascular ischemic change. CTA NECK Aortic arch: Standard branching. Imaged portion shows no evidence of aneurysm or dissection. No significant stenosis of the major arch vessel origins. Right carotid system: Moderate calcific and noncalcified plaque proximal RIGHT common carotid artery, non stenotic. Irregular calcified and noncalcified plaque at the bifurcation, RIGHT ICA origin, 60-70% stenosis based on luminal measurements 1.9/5.6 proximal/distal. No evidence of dissection, or occlusion. Left carotid system: Non stenotic atheromatous change in the proximal LEFT common carotid artery. Irregular calcific and noncalcified plaque at the LEFT ICA origin, 90% or greater stenosis, based on luminal measurements of 0.6/4.1 proximal/distal. Some apparent reduction in luminal caliber of the cervical LEFT ICA could underestimate the degree of stenosis. No evidence of dissection, or occlusion. Vertebral arteries: Codominant. Calcified plaque at the LEFT vertebral origin estimated 50% stenosis. No evidence of dissection, or occlusion. Nonvascular soft tissues: BILATERAL pleural effusions. BILATERAL airspace opacities, correlate clinically for pneumonia versus edema, greater in the LEFT upper lobe. No neck masses. Airway midline. Central venous catheter without pneumothorax. Cervical spondylosis. In CTA HEAD Anterior circulation: Calcific plaque in the cavernous and supraclinoid RIGHT ICA segments, non stenotic. 50% tandem stenoses due to calcific plaque in the inferior and superior cavernous segments of the LEFT ICA.  BILATERAL  ICA supraclinoid and termini segments widely patent. No M1 stenosis or occlusion. Both anterior cerebral arteries unremarkable. No significant MCA branch stenosis or occlusion. No visible aneurysm or vascular malformation. Posterior circulation: Both vertebral arteries contribute to formation of basilar which is widely patent. No cerebellar branch occlusion or proximal PCA disease. No aneurysm, or vascular malformation. Venous sinuses: As permitted by contrast timing, patent. Anatomic variants: None of significance. Delayed phase:   No abnormal intracranial enhancement. IMPRESSION: Significant calcific and noncalcified plaque at the LEFT ICA origin, luminal diameter of less than 1 mm, estimated 90% stenosis or greater. Similar less severe calcific and noncalcified plaque, RIGHT ICA origin, estimated 60-70% stenosis. Tandem 50% stenoses cavernous LEFT ICA, but no other intracranial atheromatous change of significance. No posterior circulation disease of significance. BILATERAL pleural effusions, and BILATERAL LEFT greater than RIGHT airspace opacities of uncertain significance. Correlate clinically for pneumonia versus edema. Electronically Signed   By: Staci Righter M.D.   On: 11/09/2015 11:30   Ct Angio Neck W Or Wo Contrast  Result Date: 11/09/2015 CLINICAL DATA:  Noninvasive testing discovered BILATERAL carotid stenosis. Coronary artery disease. No neurologic symptoms. EXAM: CT ANGIOGRAPHY HEAD AND NECK TECHNIQUE: Multidetector CT imaging of the head and neck was performed using the standard protocol during bolus administration of intravenous contrast. Multiplanar CT image reconstructions and MIPs were obtained to evaluate the vascular anatomy. Carotid stenosis measurements (when applicable) are obtained utilizing NASCET criteria, using the distal internal carotid diameter as the denominator. CONTRAST:  50 mL Isovue 370. COMPARISON:  None. FINDINGS: CT HEAD Calvarium and skull base: No fracture or  destructive lesion. Mastoids and middle ears are grossly clear. Paranasal sinuses: Layering fluid RIGHT greater than LEFT maxillary sinuses. Orbits: Negative. Brain: No evidence of acute abnormality, including acute infarct, hemorrhage, hydrocephalus, or mass lesion. Mild atrophy, slightly premature for age. Slight hypoattenuation of white matter representing chronic microvascular ischemic change. CTA NECK Aortic arch: Standard branching. Imaged portion shows no evidence of aneurysm or dissection. No significant stenosis of the major arch vessel origins. Right carotid system: Moderate calcific and noncalcified plaque proximal RIGHT common carotid artery, non stenotic. Irregular calcified and noncalcified plaque at the bifurcation, RIGHT ICA origin, 60-70% stenosis based on luminal measurements 1.9/5.6 proximal/distal. No evidence of dissection, or occlusion. Left carotid system: Non stenotic atheromatous change in the proximal LEFT common carotid artery. Irregular calcific and noncalcified plaque at the LEFT ICA origin, 90% or greater stenosis, based on luminal measurements of 0.6/4.1 proximal/distal. Some apparent reduction in luminal caliber of the cervical LEFT ICA could underestimate the degree of stenosis. No evidence of dissection, or occlusion. Vertebral arteries: Codominant. Calcified plaque at the LEFT vertebral origin estimated 50% stenosis. No evidence of dissection, or occlusion. Nonvascular soft tissues: BILATERAL pleural effusions. BILATERAL airspace opacities, correlate clinically for pneumonia versus edema, greater in the LEFT upper lobe. No neck masses. Airway midline. Central venous catheter without pneumothorax. Cervical spondylosis. In CTA HEAD Anterior circulation: Calcific plaque in the cavernous and supraclinoid RIGHT ICA segments, non stenotic. 50% tandem stenoses due to calcific plaque in the inferior and superior cavernous segments of the LEFT ICA. BILATERAL ICA supraclinoid and termini  segments widely patent. No M1 stenosis or occlusion. Both anterior cerebral arteries unremarkable. No significant MCA branch stenosis or occlusion. No visible aneurysm or vascular malformation. Posterior circulation: Both vertebral arteries contribute to formation of basilar which is widely patent. No cerebellar branch occlusion or proximal PCA disease. No aneurysm, or vascular malformation. Venous sinuses: As permitted by contrast  timing, patent. Anatomic variants: None of significance. Delayed phase:   No abnormal intracranial enhancement. IMPRESSION: Significant calcific and noncalcified plaque at the LEFT ICA origin, luminal diameter of less than 1 mm, estimated 90% stenosis or greater. Similar less severe calcific and noncalcified plaque, RIGHT ICA origin, estimated 60-70% stenosis. Tandem 50% stenoses cavernous LEFT ICA, but no other intracranial atheromatous change of significance. No posterior circulation disease of significance. BILATERAL pleural effusions, and BILATERAL LEFT greater than RIGHT airspace opacities of uncertain significance. Correlate clinically for pneumonia versus edema. Electronically Signed   By: Staci Righter M.D.   On: 11/09/2015 11:30   Ir Perc Cholecystostomy  Result Date: 11/08/2015 INDICATION: 65 year old male with acute acalculous cholecystitis EXAM: IR IMAGE GUIDED DRAINAGE BY PERCUTANEOUS CATHETER MEDICATIONS: The patient is currently admitted to the hospital and receiving intravenous antibiotics. The antibiotics were administered within an appropriate time frame prior to the initiation of the procedure. ANESTHESIA/SEDATION: Fentanyl 50 mcg IV; Versed 1.0 mg IV, 4 mg Zofran Moderate Sedation Time:  35 minutes The patient was continuously monitored during the procedure by the interventional radiology nurse under my direct supervision. COMPLICATIONS: None PROCEDURE: Informed written consent was obtained from the patient and the patient's family after a thorough discussion of  the procedural risks, benefits and alternatives. All questions were addressed. Maximal Sterile Barrier Technique was utilized including caps, mask, sterile gowns, sterile gloves, sterile drape, hand hygiene and skin antiseptic. A timeout was performed prior to the initiation of the procedure. Ultrasound survey of the right upper quadrant was performed for planning purposes. Once the patient is prepped and draped in the usual sterile fashion, the skin and subcutaneous tissues overlying the gallbladder were generously infiltrated 1% lidocaine for local anesthesia. Using ultrasound guidance, attempt was made to guide Chiba needle through liver parenchyma into the gallbladder. The arrangement of the gallbladder in the right upper quadrant precluded a liver passage, given the overlying ribs. Access was through the dome of the gallbladder. With removal of the stylet, spontaneous dark bile drainage occurred. Using modified Seldinger technique, a 10 French drain was placed into the gallbladder fossa, with aspiration of the sample for the lab. Contrast injection confirmed position of the tube within the gallbladder lumen. Drainage catheter was attached to gravity drain with a suture retention placed. Patient tolerated the procedure well and remained hemodynamically stable throughout. No complications were encountered and no significant blood loss encountered. IMPRESSION: Status post percutaneous cholecystostomy. Sample of of bile was sent to the lab for analysis. Signed, Dulcy Fanny. Earleen Newport, DO [facility] Radiology Electronically Signed   By: Corrie Mckusick D.O.   On: 11/08/2015 18:06   Dg Abd Portable 1v  Result Date: 11/08/2015 CLINICAL DATA:  No bowel sounds EXAM: PORTABLE ABDOMEN - 1 VIEW COMPARISON:  11/08/2015 FINDINGS: There is normal small bowel gas pattern. A cholecystostomy catheter is noted in right lower quadrant. Moderate gas noted within transverse colon.  IMPRESSION: Normal small bowel gas pattern. Moderate gas noted in transverse colon. Cholecystostomy catheter is noted in right upper quadrant. Electronically Signed   By: Lahoma Crocker M.D.   On: 11/08/2015 23:07    Anti-infectives: Anti-infectives    Start     Dose/Rate Route Frequency Ordered Stop   11/09/15 1400  vancomycin (VANCOCIN) 1,250 mg in sodium chloride 0.9 % 250 mL IVPB     1,250 mg 166.7 mL/hr over 90 Minutes Intravenous Every 12 hours 11/09/15 1323     11/08/15 0230  piperacillin-tazobactam (ZOSYN) IVPB 3.375  g     3.375 g 12.5 mL/hr over 240 Minutes Intravenous Every 8 hours 11/07/15 2205     11/07/15 1630  piperacillin-tazobactam (ZOSYN) IVPB 3.375 g  Status:  Discontinued     3.375 g 12.5 mL/hr over 240 Minutes Intravenous Every 8 hours 11/07/15 1553 11/07/15 2205      Assessment/Plan: TX from [place], acute cholecystitis/CAD S/p NSTEMI/AF with RVR - CABG planned for this week S/p IR cholecystostomy tube placement 11/08/15 80-90% carotid stenosis by [place] -CTA head and neck this a.m. results pending FEN:  on clears tolerating well, can adv as tol  ID:  Day 7 Zosyn DVT:  SCD/heparin drip  Not much to add from Gen Surgery standpoint F/u with Dr. Dalbert Batman after DC Call with questions    LOS: 3 days    Rosario Jacks., [facility] 11/10/2015

## 2015-11-10 NOTE — Progress Notes (Signed)
Patient ID: Brian Casey, male   DOB: April 25, 1950, 65 y.o.   MRN: [id number]    Referring Physician(s): Ingram,H  Supervising Physician: Pilar Jarvis Patient Status:  IP  Chief Complaint:  cholecystitis  Subjective: Pt with some RUQ/occ rt shoulder discomfort ; no N/V  Allergies: Bupropion hcl and Penicillins  Medications: Prior to Admission medications   Medication Sig Start Date End Date Taking? Authorizing Provider  ALPRAZolam (XANAX) 0.25 MG tablet Take 1 tablet (0.25 mg total) by mouth 2 (two) times daily as needed. Patient taking differently: Take 0.25 mg by mouth 2 (two) times daily as needed for anxiety.  11/06/14  Yes Lucille Passy, MD  levothyroxine (SYNTHROID, LEVOTHROID) 100 MCG tablet Take 1 tablet (100 mcg total) by mouth daily before breakfast. OFFICE VISIT WITH  LABS REQUIRED FOR ADDITIONAL REFILLS 10/07/15  Yes Lucille Passy, MD  losartan-hydrochlorothiazide (HYZAAR) 100-25 MG tablet Take 1 tablet by mouth daily.   Yes Historical Provider, MD  metFORMIN (GLUCOPHAGE) 500 MG tablet Take 500 mg by mouth 2 (two) times daily with a meal.   Yes Historical Provider, MD  omeprazole (PRILOSEC) 20 MG capsule Take 1 capsule (20 mg total) by mouth daily. OFFICE VISIT REQUIRED FOR ADDITIONAL REFILLS 10/07/15  Yes Lucille Passy, MD  Probiotic Product (PROBIOTIC PO) Take by mouth.   Yes Historical Provider, MD  simvastatin (ZOCOR) 40 MG tablet Take 1 tablet (40 mg total) by mouth at bedtime. 12/10/14  Yes Lucille Passy, MD  amiodarone (PACERONE) 400 MG tablet Take 1 tablet (400 mg total) by mouth 2 (two) times daily. Patient not taking: Reported on 11/07/2015 11/07/15   Bettey Costa, MD  aspirin 81 MG chewable tablet Chew 1 tablet (81 mg total) by mouth before cath procedure. Patient not taking: Reported on 11/07/2015 11/07/15   Bettey Costa, MD  diltiazem (CARDIZEM) 30 MG tablet Take 1 tablet (30 mg total) by mouth 3 (three) times daily. Patient not taking: Reported on 11/07/2015 11/07/15   Bettey Costa, MD  escitalopram (LEXAPRO) 10 MG tablet Take 1 tablet (10 mg total) by mouth daily. Patient not taking: Reported on 11/07/2015 11/07/15   Bettey Costa, MD  metoprolol tartrate (LOPRESSOR) 25 MG tablet Take 1 tablet (25 mg total) by mouth 2 (two) times daily. Patient not taking: Reported on 11/07/2015 11/07/15   Bettey Costa, MD  piperacillin-tazobactam (ZOSYN) 3.375 GM/50ML IVPB Inject 50 mLs (3.375 g total) into the vein every 8 (eight) hours. Patient not taking: Reported on 11/07/2015 11/06/15   Bettey Costa, MD  temazepam (RESTORIL) 15 MG capsule Take 1 capsule (15 mg total) by mouth at bedtime as needed for sleep. Patient not taking: Reported on 11/07/2015 11/07/15   Bettey Costa, MD     Vital Signs: BP (!) 159/70   Pulse 66   Temp 97.4 F (36.3 C) (Oral)   Resp 18   Ht _0  (1.702 m)   Wt 222 lb 0.1 oz (100.7 kg)   SpO2 100%   BMI 34.77 kg/m   Physical Exam GB drain intact, insertion site with old blood/mildly tender , no acute bleeding, output 160 cc dark bile; cx's gm neg rods  Imaging: Ct Angio Head W Or Wo Contrast  Result Date: 11/09/2015 CLINICAL DATA:  Noninvasive testing discovered BILATERAL carotid stenosis. Coronary artery disease. No neurologic symptoms. EXAM: CT ANGIOGRAPHY HEAD AND NECK TECHNIQUE: Multidetector CT imaging of the head and neck was performed using the standard protocol during bolus administration of intravenous contrast. Multiplanar CT image  reconstructions and MIPs were obtained to evaluate the vascular anatomy. Carotid stenosis measurements (when applicable) are obtained utilizing NASCET criteria, using the distal internal carotid diameter as the denominator. CONTRAST:  50 mL Isovue 370. COMPARISON:  None. FINDINGS: CT HEAD Calvarium and skull base: No fracture or destructive lesion. Mastoids and middle ears are grossly clear. Paranasal sinuses: Layering fluid RIGHT greater than LEFT maxillary sinuses. Orbits: Negative. Brain: No evidence of acute abnormality,  including acute infarct, hemorrhage, hydrocephalus, or mass lesion. Mild atrophy, slightly premature for age. Slight hypoattenuation of white matter representing chronic microvascular ischemic change. CTA NECK Aortic arch: Standard branching. Imaged portion shows no evidence of aneurysm or dissection. No significant stenosis of the major arch vessel origins. Right carotid system: Moderate calcific and noncalcified plaque proximal RIGHT common carotid artery, non stenotic. Irregular calcified and noncalcified plaque at the bifurcation, RIGHT ICA origin, 60-70% stenosis based on luminal measurements 1.9/5.6 proximal/distal. No evidence of dissection, or occlusion. Left carotid system: Non stenotic atheromatous change in the proximal LEFT common carotid artery. Irregular calcific and noncalcified plaque at the LEFT ICA origin, 90% or greater stenosis, based on luminal measurements of 0.6/4.1 proximal/distal. Some apparent reduction in luminal caliber of the cervical LEFT ICA could underestimate the degree of stenosis. No evidence of dissection, or occlusion. Vertebral arteries: Codominant. Calcified plaque at the LEFT vertebral origin estimated 50% stenosis. No evidence of dissection, or occlusion. Nonvascular soft tissues: BILATERAL pleural effusions. BILATERAL airspace opacities, correlate clinically for pneumonia versus edema, greater in the LEFT upper lobe. No neck masses. Airway midline. Central venous catheter without pneumothorax. Cervical spondylosis. In CTA HEAD Anterior circulation: Calcific plaque in the cavernous and supraclinoid RIGHT ICA segments, non stenotic. 50% tandem stenoses due to calcific plaque in the inferior and superior cavernous segments of the LEFT ICA. BILATERAL ICA supraclinoid and termini segments widely patent. No M1 stenosis or occlusion. Both anterior cerebral arteries unremarkable. No significant MCA branch stenosis or occlusion. No visible aneurysm or vascular malformation. Posterior  circulation: Both vertebral arteries contribute to formation of basilar which is widely patent. No cerebellar branch occlusion or proximal PCA disease. No aneurysm, or vascular malformation. Venous sinuses: As permitted by contrast timing, patent. Anatomic variants: None of significance. Delayed phase:   No abnormal intracranial enhancement. IMPRESSION: Significant calcific and noncalcified plaque at the LEFT ICA origin, luminal diameter of less than 1 mm, estimated 90% stenosis or greater. Similar less severe calcific and noncalcified plaque, RIGHT ICA origin, estimated 60-70% stenosis. Tandem 50% stenoses cavernous LEFT ICA, but no other intracranial atheromatous change of significance. No posterior circulation disease of significance. BILATERAL pleural effusions, and BILATERAL LEFT greater than RIGHT airspace opacities of uncertain significance. Correlate clinically for pneumonia versus edema. Electronically Signed   By: Staci Righter M.D.   On: 11/09/2015 11:30   Dg Chest 2 View  Result Date: 11/08/2015 CLINICAL DATA:  Shortness of breath. Abdominal pain. Gallbladder and heart complications. EXAM: CHEST  2 VIEW COMPARISON:  11/10/2013 FINDINGS: Mild cardiac enlargement with central pulmonary vascular congestion. Mild interstitial pattern to the lungs likely represents early interstitial edema. Changes are new since prior study. Small bilateral pleural effusions. No pneumothorax. Calcified aorta. IMPRESSION: Cardiac enlargement with mild central pulmonary vascular congestion, interstitial edema, and small pleural effusions. Electronically Signed   By: Lucienne Capers M.D.   On: 11/08/2015 01:46   Ct Angio Neck W Or Wo Contrast  Result Date: 11/09/2015 CLINICAL DATA:  Noninvasive testing discovered BILATERAL carotid stenosis. Coronary artery disease. No neurologic symptoms.  EXAM: CT ANGIOGRAPHY HEAD AND NECK TECHNIQUE: Multidetector CT imaging of the head and neck was performed using the standard protocol  during bolus administration of intravenous contrast. Multiplanar CT image reconstructions and MIPs were obtained to evaluate the vascular anatomy. Carotid stenosis measurements (when applicable) are obtained utilizing NASCET criteria, using the distal internal carotid diameter as the denominator. CONTRAST:  50 mL Isovue 370. COMPARISON:  None. FINDINGS: CT HEAD Calvarium and skull base: No fracture or destructive lesion. Mastoids and middle ears are grossly clear. Paranasal sinuses: Layering fluid RIGHT greater than LEFT maxillary sinuses. Orbits: Negative. Brain: No evidence of acute abnormality, including acute infarct, hemorrhage, hydrocephalus, or mass lesion. Mild atrophy, slightly premature for age. Slight hypoattenuation of white matter representing chronic microvascular ischemic change. CTA NECK Aortic arch: Standard branching. Imaged portion shows no evidence of aneurysm or dissection. No significant stenosis of the major arch vessel origins. Right carotid system: Moderate calcific and noncalcified plaque proximal RIGHT common carotid artery, non stenotic. Irregular calcified and noncalcified plaque at the bifurcation, RIGHT ICA origin, 60-70% stenosis based on luminal measurements 1.9/5.6 proximal/distal. No evidence of dissection, or occlusion. Left carotid system: Non stenotic atheromatous change in the proximal LEFT common carotid artery. Irregular calcific and noncalcified plaque at the LEFT ICA origin, 90% or greater stenosis, based on luminal measurements of 0.6/4.1 proximal/distal. Some apparent reduction in luminal caliber of the cervical LEFT ICA could underestimate the degree of stenosis. No evidence of dissection, or occlusion. Vertebral arteries: Codominant. Calcified plaque at the LEFT vertebral origin estimated 50% stenosis. No evidence of dissection, or occlusion. Nonvascular soft tissues: BILATERAL pleural effusions. BILATERAL airspace opacities, correlate clinically for pneumonia versus  edema, greater in the LEFT upper lobe. No neck masses. Airway midline. Central venous catheter without pneumothorax. Cervical spondylosis. In CTA HEAD Anterior circulation: Calcific plaque in the cavernous and supraclinoid RIGHT ICA segments, non stenotic. 50% tandem stenoses due to calcific plaque in the inferior and superior cavernous segments of the LEFT ICA. BILATERAL ICA supraclinoid and termini segments widely patent. No M1 stenosis or occlusion. Both anterior cerebral arteries unremarkable. No significant MCA branch stenosis or occlusion. No visible aneurysm or vascular malformation. Posterior circulation: Both vertebral arteries contribute to formation of basilar which is widely patent. No cerebellar branch occlusion or proximal PCA disease. No aneurysm, or vascular malformation. Venous sinuses: As permitted by contrast timing, patent. Anatomic variants: None of significance. Delayed phase:   No abnormal intracranial enhancement. IMPRESSION: Significant calcific and noncalcified plaque at the LEFT ICA origin, luminal diameter of less than 1 mm, estimated 90% stenosis or greater. Similar less severe calcific and noncalcified plaque, RIGHT ICA origin, estimated 60-70% stenosis. Tandem 50% stenoses cavernous LEFT ICA, but no other intracranial atheromatous change of significance. No posterior circulation disease of significance. BILATERAL pleural effusions, and BILATERAL LEFT greater than RIGHT airspace opacities of uncertain significance. Correlate clinically for pneumonia versus edema. Electronically Signed   By: Staci Righter M.D.   On: 11/09/2015 11:30   Ir Perc Cholecystostomy  Result Date: 11/08/2015 INDICATION: 65 year old male with acute acalculous cholecystitis EXAM: IR IMAGE GUIDED DRAINAGE BY PERCUTANEOUS CATHETER MEDICATIONS: The patient is currently admitted to the hospital and receiving intravenous antibiotics. The antibiotics were administered within an appropriate time frame prior to the  initiation of the procedure. ANESTHESIA/SEDATION: Fentanyl 50 mcg IV; Versed 1.0 mg IV, 4 mg Zofran Moderate Sedation Time:  35 minutes The patient was continuously monitored during the procedure by the interventional radiology nurse under my direct supervision. COMPLICATIONS: None  PROCEDURE: Informed written consent was obtained from the patient and the patient's family after a thorough discussion of the procedural risks, benefits and alternatives. All questions were addressed. Maximal Sterile Barrier Technique was utilized including caps, mask, sterile gowns, sterile gloves, sterile drape, hand hygiene and skin antiseptic. A timeout was performed prior to the initiation of the procedure. Ultrasound survey of the right upper quadrant was performed for planning purposes. Once the patient is prepped and draped in the usual sterile fashion, the skin and subcutaneous tissues overlying the gallbladder were generously infiltrated 1% lidocaine for local anesthesia. Using ultrasound guidance, attempt was made to guide Chiba needle through liver parenchyma into the gallbladder. The arrangement of the gallbladder in the right upper quadrant precluded a liver passage, given the overlying ribs. Access was through the dome of the gallbladder. With removal of the stylet, spontaneous dark bile drainage occurred. Using modified Seldinger technique, a 10 French drain was placed into the gallbladder fossa, with aspiration of the sample for the lab. Contrast injection confirmed position of the tube within the gallbladder lumen. Drainage catheter was attached to gravity drain with a suture retention placed. Patient tolerated the procedure well and remained hemodynamically stable throughout. No complications were encountered and no significant blood loss encountered. IMPRESSION: Status post percutaneous cholecystostomy. Sample of of bile was sent to the lab for analysis. Signed, Dulcy Fanny. Earleen Newport, DO [facility] Radiology Electronically Signed   By: Corrie Mckusick D.O.   On: 11/08/2015 18:06   Dg Abd Portable 1v  Result Date: 11/08/2015 CLINICAL DATA:  No bowel sounds EXAM: PORTABLE ABDOMEN - 1 VIEW COMPARISON:  11/08/2015 FINDINGS: There is normal small bowel gas pattern. A cholecystostomy catheter is noted in right lower quadrant. Moderate gas noted within transverse colon. IMPRESSION: Normal small bowel gas pattern. Moderate gas noted in transverse colon. Cholecystostomy catheter is noted in right upper quadrant. Electronically Signed   By: Lahoma Crocker M.D.   On: 11/08/2015 23:07    Labs:  CBC:  Recent Labs  11/08/15 0347 11/08/15 2025 11/09/15 0450 11/10/15 0630  WBC 13.0* 9.5 15.3* 10.7*  HGB 10.6* 10.9* 10.1* 9.1*  HCT 30.6* 31.3* 29.2* 27.1*  PLT 176 196 174 189    COAGS:  Recent Labs  11/04/15 0841 11/08/15 0347  INR 1.14 1.15  APTT 27  --     BMP:  Recent Labs  11/08/15 0347 11/08/15 2025 11/09/15 0450 11/10/15 0630  NA 129* 131* 127* 131*  K 3.6 3.8 4.8 3.8  CL 99* 100* 100* 97*  CO2 21* 21* 20* 26  GLUCOSE 110* 115* 493* 235*  BUN _0 CALCIUM 7.9* 8.0* 7.1* 7.9*  CREATININE 1.29* 1.28* 1.15 1.23  GFRNONAA 57* 58* >60 >60  GFRAA >60 >60 >60 >60    LIVER FUNCTION TESTS:  Recent Labs  11/08/15 0347 11/08/15 2025 11/09/15 0450 11/10/15 0630  BILITOT 1.3* 1.4* 0.9 0.8  AST 26 25 78* 42*  ALT 33 32 61 54  ALKPHOS 163* 169* 137* 148*  PROT 5.5* 5.7* 4.8* 5.0*  ALBUMIN 2.2* 2.1* 1.9* 1.8*    Assessment and Plan: Cholecystitis, s/p per cholecystostomy 8/18; AF; WBC 10.7(15.3), HGB 9.1(10.1), t bili 0.8, K/creat ok; bile cx's - gm neg rods- check sens; flush drain daily with 5 cc sterile NS; drain will need to remain in place at least 4-6 weeks unless cholecystectomy done in interim; other plans as per primary /CCS/cards/vasc   Electronically Signed: D. Lennette Bihari  Allred 11/10/2015, 3:11 PM   I spent a total of 15 minutes  at the the patient's bedside AND on the patient's hospital floor or unit, greater than 50% of which was counseling/coordinating care for gallbladder drain

## 2015-11-10 NOTE — Progress Notes (Signed)
PROGRESS NOTE    Brian Casey  PRF:[id number]  DOB: [birth year]  DOA: 11/07/2015 PCP: [name], MD Outpatient Specialists:   Hospital course: HPI: Brian Casey is a 65 y.o. male with medical history significant for hypothyroidism, diabetes on metformin, chronic alcohol abuse, history of depression and ongoing tobacco abuse. The patient initially presented to the  regional ER and was admitted on 8/14 with a diagnosis of likely cholecystitis. He presented with sepsis physiology as well as new onset atrial fibrillation and acute kidney injury. During the hospitalization patient had positive troponins so a stress test was obtained and was positive. Patient later underwent cardiac catheterization which revealed multivessel CAD. He was also formally diagnosed with cholecystitis. His sepsis was treated with empiric broad-spectrum antibiotics. Nephrology was consulted at [facility] for his acute kidney injury. He was eventually transferred to this facility where he was evaluated by general surgery who felt the patient was a poor surgical candidate at this time and the patient was subsequently taken to interventional radiology where a percutaneous cholecystostomy tube was placed. He was also evaluated by CVTS who recommended CABG procedure next week after gallbladder was drained, abdominal tenderness resolved and nutritional status improved. Because patient's pre-albumin was 10.1 thoracic surgery started the patient on parenteral nutrition after arrival. As part of his pre-CABG evaluation he underwent carotid duplex which did reveal carotid stenosis and subsequently vascular surgery has been consulted for recommendations.  Assessment & Plan:    Sepsis due to Gram negative bacteria/Cholecystitis, acute -Sepsis physiology has resolved -Continue Zosyn and DC vancomycin  -Surgery and IR managing percutaneous drain -Discussed with Brian Casey with general surgery and no indication at this  standpoint to continue NPO status so we'll begin slow diet advance (see below) -Pain management has been an issue: Begin with IV morphine until proves can tolerate full liquids/more substantial diet before initiating oral narcotics (oral narcotics would give most consistent pain relief) -prn Tylenol -Encourage continued mobility/OOB to chair q shift and continue working with PT   Diabetes mellitus type 2, uncontrolled -Serum glucose this morning was 493 (with associated pseudohyponatremia) and hyperglycemia is likely being exacerbated by parenteral nutrition -Preadmission metformin on hold -Check CBGs before meals/at bedtime and utilize moderate sliding scale insulin, adding prandial novolog 8/20 3 units TIDWC -Hemoglobin A1c on 8/14 was 6.4      AKI (acute kidney injury) on CKD (chronic kidney disease), stage II -Renal function consistent with acute kidney injury at presentation on 8/14 with a BUN of 31 and a creatinine of 2.4 -Patient was evaluated by nephrology at previous facility who documented acute kidney injury precipitated by sepsis physiology as well as preadmission meds including ARB, diuretic and metformin -Renal function has recovered and is near baseline of 10/1.05 -Continue to follow closely and avoid offending medications    Essential hypertension/ Left ventricular diastolic dysfunction/grade 2 -Current blood pressure moderately controlled and likely being influenced by inadequate pain control -Continue Lopressor    PAF (paroxysmal atrial fibrillation)  -Patient had paroxysmal atrial fibrillation at the initial portion of his admission likely related to sepsis physiology as well as coronary ischemia -Currently maintaining sinus rhythm on amiodarone and beta blockers -Management per cardiology    NSTEMI (non-ST elevated myocardial infarction) /  Coronary artery disease involving native coronary artery of native heart with angina pectoris /  HLD (hyperlipidemia) -Patient  awaiting CABG procedure pending recovery from acute cholecystitis -On beta blocker, statin and aspirin and full dose heparin IV -Management per cardiology and thoracic  surgery      Carotid stenosis -Incidental finding on pre-CABG evaluation -Asymptomatic -Management and surgical recommendations per VVS    Protein-calorie malnutrition, severe  -Pre-albumin 10.1 -Started on parenteral nutrition on 8/18 by thoracic surgery, DC 8/20.  -Abdominal x-ray 8/18 without evidence of obstruction -Discussed with general surgery physician assistant -Nutrition consultation -DC TPN and start oral nutrition slowly as he tolerated clears, advance to soft, then further advance as tolerated -Mag and phos within normal limits.     Hypothyroidism -Continue Synthroid -TSH slightly elevated at 4.905 with free T4 only slightly elevated at 1.09 and this is likely reflective of sick euthyroid syndrome    Anxiety state/situational depression/  Alcohol abuse -Patient clearly overwhelmed by current medical status and multiple new medical diagnoses as well as prospect of upcoming surgical procedures -Further distress over lack of control over circumstances and recently quite embarrassed by fecal incontinence with healthcare personnel at the bedside -Continue supportive care -Continue Lexapro -Continue low grade CIWA with Ativan although patient out of the window for alcohol withdrawal syndrome    NICOTINE ADDICTION counseled at bedside and will need ongoing counseling to get him to quit.    DVT prophylaxis: On full dose IV heparin Code Status: Full Family Communication: Multiple family members at bedside including son and Schanz sisters Disposition Plan: At discretion of primary team  Antimicrobials: Anti-infectives    Start     Dose/Rate Route Frequency Ordered Stop   11/10/15 1400  vancomycin (VANCOCIN) 1,250 mg in sodium chloride 0.9 % 250 mL IVPB     1,250 mg 166.7 mL/hr over 90 Minutes  Intravenous Every 12 hours 11/10/15 1131 11/11/15 0159   11/09/15 1400  vancomycin (VANCOCIN) 1,250 mg in sodium chloride 0.9 % 250 mL IVPB  Status:  Discontinued     1,250 mg 166.7 mL/hr over 90 Minutes Intravenous Every 12 hours 11/09/15 1323 11/10/15 1131   11/08/15 0230  piperacillin-tazobactam (ZOSYN) IVPB 3.375 g     3.375 g 12.5 mL/hr over 240 Minutes Intravenous Every 8 hours 11/07/15 2205     11/07/15 1630  piperacillin-tazobactam (ZOSYN) IVPB 3.375 g  Status:  Discontinued     3.375 g 12.5 mL/hr over 240 Minutes Intravenous Every 8 hours 11/07/15 1553 11/07/15 2205       Subjective: Pt without complaints.   Objective: Vitals:   11/10/15 0400 11/10/15 0633 11/10/15 0700 11/10/15 0800  BP:  (!) 153/60 (!) 157/67 (!) 157/63  Pulse:  68 67 69  Resp:  _0 Temp:  98.2 F (36.8 C)  97.7 F (36.5 C)  TempSrc:  Axillary  Oral  SpO2:  95% 96% 95%  Weight: 100.7 kg (222 lb 0.1 oz)     Height:        Intake/Output Summary (Last 24 hours) at 11/10/15 1151 Last data filed at 11/10/15 1001  Gross per 24 hour  Intake          2199.95 ml  Output              560 ml  Net          1639.95 ml   Filed Weights   11/08/15 0607 11/09/15 0328 11/10/15 0400  Weight: 98.2 kg (216 lb 8 oz) 98.6 kg (217 lb 6 oz) 100.7 kg (222 lb 0.1 oz)    Exam: General: Pleasant, NAD. Neuro: Alert and oriented X 3. Moves all extremities spontaneously. HEENT:  Normal sclera clear  Neck: Supple without bruits or JVD. Lungs:  Resp regular and unlabored, CTA. Heart: RRR no s3, s4, or murmurs.  Abdomen: Soft, non-tender, non-distended, BS + x 4. Perc tube Extremities: No clubbing, cyanosis Psych: Flat Affect.    Data Reviewed: Basic Metabolic Panel:  Recent Labs Lab 11/04/15 0842  11/07/15 0648 11/08/15 0347 11/08/15 2025 11/09/15 0450 11/10/15 0630  NA  --   < > 131* 129* 131* 127* 131*  K  --   < > 3.7 3.6 3.8 4.8 3.8  CL  --   < > 102 99* 100* 100* 97*  CO2  --    < > 22 21* 21* 20* 26  GLUCOSE  --   < > 150* 110* 115* 493* 235*  BUN  --   < > _0 CREATININE  --   < > 1.24 1.29* 1.28* 1.15 1.23  CALCIUM  --   < > 7.4* 7.9* 8.0* 7.1* 7.9*  MG 1.9  --   --  1.8  --  1.8  --   PHOS  --   --   --   --   --  4.2  --   < > = values in this interval not displayed. Liver Function Tests:  Recent Labs Lab 11/05/15 0450 11/08/15 0347 11/08/15 2025 11/09/15 0450 11/10/15 0630  AST _1 78* 42*  ALT 20 33 32 61 54  ALKPHOS 66 163* 169* 137* 148*  BILITOT 1.1 1.3* 1.4* 0.9 0.8  PROT 5.6* 5.5* 5.7* 4.8* 5.0*  ALBUMIN 2.7* 2.2* 2.1* 1.9* 1.8*    Recent Labs Lab 11/04/15 0841 11/09/15 0450  LIPASE 18 23   No results for input(s): AMMONIA in the last 168 hours. CBC:  Recent Labs Lab 11/04/15 0841  11/07/15 0648 11/08/15 0347 11/08/15 2025 11/09/15 0450 11/10/15 0630  WBC 24.2*  < > 12.3* 13.0* 9.5 15.3* 10.7*  NEUTROABS 22.6*  --   --  11.4*  --  14.1*  --   HGB 14.6  < > 11.5* 10.6* 10.9* 10.1* 9.1*  HCT 40.9  < > 31.8* 30.6* 31.3* 29.2* 27.1*  MCV 96.2  < > 95.9 94.2 94.6 97.3 95.8  PLT 176  < > 144* 176 196 174 189  < > = values in this interval not displayed. Cardiac Enzymes:  Recent Labs Lab 11/04/15 0841 11/04/15 1257 11/04/15 2355 11/05/15 0607  TROPONINI 0.27* 0.04* 0.11* 0.04*   CBG (last 3)   Recent Labs  11/10/15 0058 11/10/15 0813 11/10/15 1126  GLUCAP 196* 236* 236*   Recent Results (from the past 240 hour(s))  Culture, body fluid-bottle     Status: None (Preliminary result)   Collection Time: 11/08/15  6:02 PM  Result Value Ref Range Status   Specimen Description BILE  Final   Special Requests BOTTLES DRAWN AEROBIC AND ANAEROBIC 5CC  Final   Gram Stain   Final    GRAM NEGATIVE RODS IN BOTH AEROBIC AND ANAEROBIC BOTTLES CRITICAL RESULT CALLED TO, READ BACK BY AND VERIFIED WITH: T KEETON,RN _2  11/09/15 MKELLY    Culture   Final    GRAM NEGATIVE RODS IDENTIFICATION AND  SUSCEPTIBILITIES TO FOLLOW    Report Status PENDING  Incomplete  Gram stain     Status: None   Collection Time: 11/08/15  6:02 PM  Result Value Ref Range Status   Specimen Description BILE  Final   Special Requests NONE  Final   Gram Stain  Final    ABUNDANT WBC PRESENT,BOTH PMN AND MONONUCLEAR ABUNDANT GRAM NEGATIVE RODS    Report Status 11/08/2015 FINAL  Final  Surgical pcr screen     Status: None   Collection Time: 11/08/15  8:00 PM  Result Value Ref Range Status   MRSA, PCR NEGATIVE NEGATIVE Final   Staphylococcus aureus NEGATIVE NEGATIVE Final    Comment:        The Xpert SA Assay (FDA approved for NASAL specimens in patients over 64 years of age), is one component of a comprehensive surveillance program.  Test performance has been validated by [facility] for patients greater than or equal to 20 year old. It is not intended to diagnose infection nor to guide or monitor treatment.   Culture, blood (routine x 2)     Status: None (Preliminary result)   Collection Time: 11/08/15  9:19 PM  Result Value Ref Range Status   Specimen Description BLOOD LEFT ANTECUBITAL  Final   Special Requests BOTTLES DRAWN AEROBIC AND ANAEROBIC 5CC  Final   Culture NO GROWTH < 24 HOURS  Final   Report Status PENDING  Incomplete  Culture, blood (routine x 2)     Status: None (Preliminary result)   Collection Time: 11/08/15  9:29 PM  Result Value Ref Range Status   Specimen Description BLOOD RIGHT HAND  Final   Special Requests BOTTLES DRAWN AEROBIC AND ANAEROBIC 5CC  Final   Culture NO GROWTH < 24 HOURS  Final   Report Status PENDING  Incomplete     Studies: Ct Angio Head W Or Wo Contrast  Result Date: 11/09/2015 CLINICAL DATA:  Noninvasive testing discovered BILATERAL carotid stenosis. Coronary artery disease. No neurologic symptoms. EXAM: CT ANGIOGRAPHY HEAD AND NECK TECHNIQUE: Multidetector CT imaging of the head and neck was performed using the standard protocol during bolus  administration of intravenous contrast. Multiplanar CT image reconstructions and MIPs were obtained to evaluate the vascular anatomy. Carotid stenosis measurements (when applicable) are obtained utilizing NASCET criteria, using the distal internal carotid diameter as the denominator. CONTRAST:  50 mL Isovue 370. COMPARISON:  None. FINDINGS: CT HEAD Calvarium and skull base: No fracture or destructive lesion. Mastoids and middle ears are grossly clear. Paranasal sinuses: Layering fluid RIGHT greater than LEFT maxillary sinuses. Orbits: Negative. Brain: No evidence of acute abnormality, including acute infarct, hemorrhage, hydrocephalus, or mass lesion. Mild atrophy, slightly premature for age. Slight hypoattenuation of white matter representing chronic microvascular ischemic change. CTA NECK Aortic arch: Standard branching. Imaged portion shows no evidence of aneurysm or dissection. No significant stenosis of the major arch vessel origins. Right carotid system: Moderate calcific and noncalcified plaque proximal RIGHT common carotid artery, non stenotic. Irregular calcified and noncalcified plaque at the bifurcation, RIGHT ICA origin, 60-70% stenosis based on luminal measurements 1.9/5.6 proximal/distal. No evidence of dissection, or occlusion. Left carotid system: Non stenotic atheromatous change in the proximal LEFT common carotid artery. Irregular calcific and noncalcified plaque at the LEFT ICA origin, 90% or greater stenosis, based on luminal measurements of 0.6/4.1 proximal/distal. Some apparent reduction in luminal caliber of the cervical LEFT ICA could underestimate the degree of stenosis. No evidence of dissection, or occlusion. Vertebral arteries: Codominant. Calcified plaque at the LEFT vertebral origin estimated 50% stenosis. No evidence of dissection, or occlusion. Nonvascular soft tissues: BILATERAL pleural effusions. BILATERAL airspace opacities, correlate clinically for pneumonia versus edema, greater  in the LEFT upper lobe. No neck masses. Airway midline. Central venous catheter without pneumothorax. Cervical spondylosis. In CTA HEAD  Anterior circulation: Calcific plaque in the cavernous and supraclinoid RIGHT ICA segments, non stenotic. 50% tandem stenoses due to calcific plaque in the inferior and superior cavernous segments of the LEFT ICA. BILATERAL ICA supraclinoid and termini segments widely patent. No M1 stenosis or occlusion. Both anterior cerebral arteries unremarkable. No significant MCA branch stenosis or occlusion. No visible aneurysm or vascular malformation. Posterior circulation: Both vertebral arteries contribute to formation of basilar which is widely patent. No cerebellar branch occlusion or proximal PCA disease. No aneurysm, or vascular malformation. Venous sinuses: As permitted by contrast timing, patent. Anatomic variants: None of significance. Delayed phase:   No abnormal intracranial enhancement. IMPRESSION: Significant calcific and noncalcified plaque at the LEFT ICA origin, luminal diameter of less than 1 mm, estimated 90% stenosis or greater. Similar less severe calcific and noncalcified plaque, RIGHT ICA origin, estimated 60-70% stenosis. Tandem 50% stenoses cavernous LEFT ICA, but no other intracranial atheromatous change of significance. No posterior circulation disease of significance. BILATERAL pleural effusions, and BILATERAL LEFT greater than RIGHT airspace opacities of uncertain significance. Correlate clinically for pneumonia versus edema. Electronically Signed   By: Staci Righter M.D.   On: 11/09/2015 11:30   Ct Angio Neck W Or Wo Contrast  Result Date: 11/09/2015 CLINICAL DATA:  Noninvasive testing discovered BILATERAL carotid stenosis. Coronary artery disease. No neurologic symptoms. EXAM: CT ANGIOGRAPHY HEAD AND NECK TECHNIQUE: Multidetector CT imaging of the head and neck was performed using the standard protocol during bolus administration of intravenous contrast.  Multiplanar CT image reconstructions and MIPs were obtained to evaluate the vascular anatomy. Carotid stenosis measurements (when applicable) are obtained utilizing NASCET criteria, using the distal internal carotid diameter as the denominator. CONTRAST:  50 mL Isovue 370. COMPARISON:  None. FINDINGS: CT HEAD Calvarium and skull base: No fracture or destructive lesion. Mastoids and middle ears are grossly clear. Paranasal sinuses: Layering fluid RIGHT greater than LEFT maxillary sinuses. Orbits: Negative. Brain: No evidence of acute abnormality, including acute infarct, hemorrhage, hydrocephalus, or mass lesion. Mild atrophy, slightly premature for age. Slight hypoattenuation of white matter representing chronic microvascular ischemic change. CTA NECK Aortic arch: Standard branching. Imaged portion shows no evidence of aneurysm or dissection. No significant stenosis of the major arch vessel origins. Right carotid system: Moderate calcific and noncalcified plaque proximal RIGHT common carotid artery, non stenotic. Irregular calcified and noncalcified plaque at the bifurcation, RIGHT ICA origin, 60-70% stenosis based on luminal measurements 1.9/5.6 proximal/distal. No evidence of dissection, or occlusion. Left carotid system: Non stenotic atheromatous change in the proximal LEFT common carotid artery. Irregular calcific and noncalcified plaque at the LEFT ICA origin, 90% or greater stenosis, based on luminal measurements of 0.6/4.1 proximal/distal. Some apparent reduction in luminal caliber of the cervical LEFT ICA could underestimate the degree of stenosis. No evidence of dissection, or occlusion. Vertebral arteries: Codominant. Calcified plaque at the LEFT vertebral origin estimated 50% stenosis. No evidence of dissection, or occlusion. Nonvascular soft tissues: BILATERAL pleural effusions. BILATERAL airspace opacities, correlate clinically for pneumonia versus edema, greater in the LEFT upper lobe. No neck masses.  Airway midline. Central venous catheter without pneumothorax. Cervical spondylosis. In CTA HEAD Anterior circulation: Calcific plaque in the cavernous and supraclinoid RIGHT ICA segments, non stenotic. 50% tandem stenoses due to calcific plaque in the inferior and superior cavernous segments of the LEFT ICA. BILATERAL ICA supraclinoid and termini segments widely patent. No M1 stenosis or occlusion. Both anterior cerebral arteries unremarkable. No significant MCA branch stenosis or occlusion. No visible aneurysm or vascular malformation.  Posterior circulation: Both vertebral arteries contribute to formation of basilar which is widely patent. No cerebellar branch occlusion or proximal PCA disease. No aneurysm, or vascular malformation. Venous sinuses: As permitted by contrast timing, patent. Anatomic variants: None of significance. Delayed phase:   No abnormal intracranial enhancement. IMPRESSION: Significant calcific and noncalcified plaque at the LEFT ICA origin, luminal diameter of less than 1 mm, estimated 90% stenosis or greater. Similar less severe calcific and noncalcified plaque, RIGHT ICA origin, estimated 60-70% stenosis. Tandem 50% stenoses cavernous LEFT ICA, but no other intracranial atheromatous change of significance. No posterior circulation disease of significance. BILATERAL pleural effusions, and BILATERAL LEFT greater than RIGHT airspace opacities of uncertain significance. Correlate clinically for pneumonia versus edema. Electronically Signed   By: Staci Righter M.D.   On: 11/09/2015 11:30   Ir Perc Cholecystostomy  Result Date: 11/08/2015 INDICATION: 65 year old male with acute acalculous cholecystitis EXAM: IR IMAGE GUIDED DRAINAGE BY PERCUTANEOUS CATHETER MEDICATIONS: The patient is currently admitted to the hospital and receiving intravenous antibiotics. The antibiotics were administered within an appropriate time frame prior to the initiation of the procedure. ANESTHESIA/SEDATION: Fentanyl  50 mcg IV; Versed 1.0 mg IV, 4 mg Zofran Moderate Sedation Time:  35 minutes The patient was continuously monitored during the procedure by the interventional radiology nurse under my direct supervision. COMPLICATIONS: None PROCEDURE: Informed written consent was obtained from the patient and the patient's family after a thorough discussion of the procedural risks, benefits and alternatives. All questions were addressed. Maximal Sterile Barrier Technique was utilized including caps, mask, sterile gowns, sterile gloves, sterile drape, hand hygiene and skin antiseptic. A timeout was performed prior to the initiation of the procedure. Ultrasound survey of the right upper quadrant was performed for planning purposes. Once the patient is prepped and draped in the usual sterile fashion, the skin and subcutaneous tissues overlying the gallbladder were generously infiltrated 1% lidocaine for local anesthesia. Using ultrasound guidance, attempt was made to guide Chiba needle through liver parenchyma into the gallbladder. The arrangement of the gallbladder in the right upper quadrant precluded a liver passage, given the overlying ribs. Access was through the dome of the gallbladder. With removal of the stylet, spontaneous dark bile drainage occurred. Using modified Seldinger technique, a 10 French drain was placed into the gallbladder fossa, with aspiration of the sample for the lab. Contrast injection confirmed position of the tube within the gallbladder lumen. Drainage catheter was attached to gravity drain with a suture retention placed. Patient tolerated the procedure well and remained hemodynamically stable throughout. No complications were encountered and no significant blood loss encountered. IMPRESSION: Status post percutaneous cholecystostomy. Sample of of bile was sent to the lab for analysis. Signed, Dulcy Fanny. Earleen Newport, DO [facility] Electronically Signed    By: Corrie Mckusick D.O.   On: 11/08/2015 18:06   Dg Abd Portable 1v  Result Date: 11/08/2015 CLINICAL DATA:  No bowel sounds EXAM: PORTABLE ABDOMEN - 1 VIEW COMPARISON:  11/08/2015 FINDINGS: There is normal small bowel gas pattern. A cholecystostomy catheter is noted in right lower quadrant. Moderate gas noted within transverse colon. IMPRESSION: Normal small bowel gas pattern. Moderate gas noted in transverse colon. Cholecystostomy catheter is noted in right upper quadrant. Electronically Signed   By: Lahoma Crocker M.D.   On: 11/08/2015 23:07     Scheduled Meds: . amiodarone  200 mg Oral BID  . aspirin EC  81 mg Oral Daily  . atorvastatin  40 mg Oral q1800  .  escitalopram  10 mg Oral Daily  . feeding supplement (GLUCERNA SHAKE)  237 mL Oral TID BM  . folic acid  1 mg Oral Daily  . insulin aspart  0-15 Units Subcutaneous TID WC  . insulin aspart  0-5 Units Subcutaneous QHS  . insulin aspart  3 Units Subcutaneous TID WC  . levothyroxine  100 mcg Oral QAC breakfast  . magic mouthwash  10 mL Oral TID  . metoprolol tartrate  50 mg Oral BID  . mometasone-formoterol  2 puff Inhalation BID  . pantoprazole  40 mg Oral Daily  . piperacillin-tazobactam (ZOSYN)  IV  3.375 g Intravenous Q8H  . potassium chloride  20 mEq Oral BID  . thiamine  100 mg Oral Daily   Or  . thiamine  100 mg Intravenous Daily  . vancomycin  1,250 mg Intravenous Q12H   Continuous Infusions: . heparin 2,150 Units/hr (11/10/15 1001)    Principal Problem:   Sepsis due to Gram negative bacteria (HCC) Active Problems:   Hypothyroidism   Diabetes mellitus type 2, uncontrolled ([place])   Anxiety state   NICOTINE ADDICTION   HLD (hyperlipidemia)   Essential hypertension   Cholecystitis, acute   PAF (paroxysmal atrial fibrillation) (HCC)   AKI (acute kidney injury) ([place])   Aortic atherosclerosis (HCC)   NSTEMI (non-ST elevated myocardial infarction) ([place])   Coronary artery disease involving native coronary artery of  native heart with angina pectoris (HCC)   Carotid stenosis   CKD (chronic kidney disease), stage II   Protein-calorie malnutrition, severe ([place])   Alcohol abuse   Left ventricular diastolic dysfunction/grade 2  Time spent:   Irwin Brakeman, MD, FAAFP [facility] Pager [phone]  If 7PM-7AM, please contact night-coverage [email or web address] Password [email or web address] 11/10/2015, 11:51 AM    LOS: 3 days

## 2015-11-10 NOTE — Progress Notes (Signed)
Patient continues to be irregular on heart monitor AFIB with intermittent NSR denies any pain I will continue to monitor patient is on oral meds for control.

## 2015-11-11 ENCOUNTER — Inpatient Hospital Stay (HOSPITAL_COMMUNITY): Payer: Managed Care, Other (non HMO)

## 2015-11-11 DIAGNOSIS — E1100 Type 2 diabetes mellitus with hyperosmolarity without nonketotic hyperglycemic-hyperosmolar coma (NKHHC): Secondary | ICD-10-CM

## 2015-11-11 DIAGNOSIS — E43 Unspecified severe protein-calorie malnutrition: Secondary | ICD-10-CM

## 2015-11-11 DIAGNOSIS — R9439 Abnormal result of other cardiovascular function study: Secondary | ICD-10-CM

## 2015-11-11 DIAGNOSIS — R1011 Right upper quadrant pain: Secondary | ICD-10-CM

## 2015-11-11 DIAGNOSIS — I779 Disorder of arteries and arterioles, unspecified: Secondary | ICD-10-CM

## 2015-11-11 DIAGNOSIS — K810 Acute cholecystitis: Secondary | ICD-10-CM

## 2015-11-11 DIAGNOSIS — I519 Heart disease, unspecified: Secondary | ICD-10-CM

## 2015-11-11 DIAGNOSIS — F17200 Nicotine dependence, unspecified, uncomplicated: Secondary | ICD-10-CM

## 2015-11-11 LAB — PULMONARY FUNCTION TEST
DL/VA % pred: 119 %
DL/VA: 5.28 ml/min/mmHg/L
DLCO cor % pred: 38 %
DLCO cor: 10.84 ml/min/mmHg
DLCO unc % pred: 31 %
DLCO unc: 8.93 ml/min/mmHg
FEF 25-75 Post: 0.44 L/sec
FEF 25-75 Pre: 0.36 L/sec
FEF2575-%Change-Post: 22 %
FEF2575-%Pred-Post: 17 %
FEF2575-%Pred-Pre: 14 %
FEV1-%Change-Post: 5 %
FEV1-%Pred-Post: 24 %
FEV1-%Pred-Pre: 22 %
FEV1-Post: 0.76 L
FEV1-Pre: 0.72 L
FEV1FVC-%Change-Post: -6 %
FEV1FVC-%Pred-Pre: 79 %
FEV6-%Change-Post: 12 %
FEV6-%Pred-Post: 33 %
FEV6-%Pred-Pre: 29 %
FEV6-Post: 1.32 L
FEV6-Pre: 1.18 L
FEV6FVC-%Change-Post: 0 %
FEV6FVC-%Pred-Post: 102 %
FEV6FVC-%Pred-Pre: 103 %
FVC-%Change-Post: 12 %
FVC-%Pred-Post: 32 %
FVC-%Pred-Pre: 28 %
FVC-Post: 1.35 L
FVC-Pre: 1.2 L
Post FEV1/FVC ratio: 56 %
Post FEV6/FVC ratio: 98 %
Pre FEV1/FVC ratio: 60 %
Pre FEV6/FVC Ratio: 98 %
RV % pred: 61 %
RV: 1.32 L
TLC % pred: 38 %
TLC: 2.47 L

## 2015-11-11 LAB — CBC
HCT: 29 % — ABNORMAL LOW (ref 39.0–52.0)
Hemoglobin: 9.6 g/dL — ABNORMAL LOW (ref 13.0–17.0)
MCH: 32 pg (ref 26.0–34.0)
MCHC: 33.1 g/dL (ref 30.0–36.0)
MCV: 96.7 fL (ref 78.0–100.0)
Platelets: 220 10*3/uL (ref 150–400)
RBC: 3 MIL/uL — ABNORMAL LOW (ref 4.22–5.81)
RDW: 13.8 % (ref 11.5–15.5)
WBC: 9.6 10*3/uL (ref 4.0–10.5)

## 2015-11-11 LAB — COMPREHENSIVE METABOLIC PANEL
ALT: 55 U/L (ref 17–63)
AST: 36 U/L (ref 15–41)
Albumin: 1.9 g/dL — ABNORMAL LOW (ref 3.5–5.0)
Alkaline Phosphatase: 210 U/L — ABNORMAL HIGH (ref 38–126)
Anion gap: 6 (ref 5–15)
BUN: 9 mg/dL (ref 6–20)
CO2: 27 mmol/L (ref 22–32)
Calcium: 8 mg/dL — ABNORMAL LOW (ref 8.9–10.3)
Chloride: 102 mmol/L (ref 101–111)
Creatinine, Ser: 1.16 mg/dL (ref 0.61–1.24)
GFR calc Af Amer: >60 mL/min (ref >60)
GFR calc non Af Amer: >60 mL/min (ref >60)
Glucose, Bld: 120 mg/dL — ABNORMAL HIGH (ref 65–99)
Potassium: 4.2 mmol/L (ref 3.5–5.1)
Sodium: 135 mmol/L (ref 135–145)
Total Bilirubin: 1 mg/dL (ref 0.3–1.2)
Total Protein: 5.3 g/dL — ABNORMAL LOW (ref 6.5–8.1)

## 2015-11-11 LAB — GLUCOSE, CAPILLARY
GLUCOSE-CAPILLARY: 181 mg/dL — AB (ref 65–99)
GLUCOSE-CAPILLARY: 94 mg/dL (ref 65–99)
Glucose-Capillary: 123 mg/dL — ABNORMAL HIGH (ref 65–99)
Glucose-Capillary: 237 mg/dL — ABNORMAL HIGH (ref 65–99)

## 2015-11-11 LAB — HEPARIN LEVEL (UNFRACTIONATED)
HEPARIN UNFRACTIONATED: 0.93 [IU]/mL — AB (ref 0.30–0.70)
Heparin Unfractionated: 0.68 IU/mL (ref 0.30–0.70)

## 2015-11-11 MED ORDER — ALBUTEROL SULFATE (2.5 MG/3ML) 0.083% IN NEBU
2.5000 mg | INHALATION_SOLUTION | Freq: Once | RESPIRATORY_TRACT | Status: AC
  Administered 2015-11-11: 2.5 mg via RESPIRATORY_TRACT

## 2015-11-11 MED ORDER — ALBUMIN HUMAN 25 % IV SOLN
25.0000 g | Freq: Four times a day (QID) | INTRAVENOUS | Status: AC
  Administered 2015-11-11 (×3): 25 g via INTRAVENOUS
  Filled 2015-11-11: qty 50
  Filled 2015-11-11 (×2): qty 100
  Filled 2015-11-11: qty 50

## 2015-11-11 MED ORDER — IOPAMIDOL (ISOVUE-370) INJECTION 76%
50.0000 mL | Freq: Once | INTRAVENOUS | Status: AC | PRN
  Administered 2015-11-09: 50 mL via INTRAVENOUS

## 2015-11-11 MED ORDER — FUROSEMIDE 10 MG/ML IJ SOLN
20.0000 mg | Freq: Two times a day (BID) | INTRAMUSCULAR | Status: DC
  Administered 2015-11-11 – 2015-11-12 (×3): 20 mg via INTRAVENOUS
  Filled 2015-11-11 (×3): qty 2

## 2015-11-11 NOTE — Progress Notes (Signed)
   11/11/15 1600  Clinical Encounter Type  Visited With Patient and family together  Visit Type Spiritual support  Referral From Nurse  Spiritual Encounters  Spiritual Needs Prayer;Emotional  Stress Factors  Patient Stress Factors Health changes;Other (Comment) (Troubles at work)  Family Stress Factors Major life changes  Chaplain visited with patient and wife, listened to their concerns about his health, her tiredness, and his job. Prayed for his recovery and his ability to have surgery on Thursday. Apollonia Amini, Chaplain

## 2015-11-11 NOTE — Progress Notes (Signed)
PROGRESS NOTE    Brian Casey  WUJ:[id number]  DOB: 1950-08-20  DOA: 11/07/2015 PCP: [name], MD Outpatient Specialists:   Hospital course: HPI: Brian Casey is a 65 y.o. male with medical history significant for hypothyroidism, diabetes on metformin, chronic alcohol abuse, history of depression and ongoing tobacco abuse. The patient initially presented to the [place] [facility] ER and was admitted on 8/14 with a diagnosis of likely cholecystitis. He presented with sepsis physiology as well as new onset atrial fibrillation and acute kidney injury. During the hospitalization patient had positive troponins so a stress test was obtained and was positive. Patient later underwent cardiac catheterization which revealed multivessel CAD. He was also formally diagnosed with cholecystitis. His sepsis was treated with empiric broad-spectrum antibiotics. Nephrology was consulted at [facility] for his acute kidney injury. He was eventually transferred to this facility where he was evaluated by general surgery who felt the patient was a poor surgical candidate at this time and the patient was subsequently taken to interventional radiology where a percutaneous cholecystostomy tube was placed. He was also evaluated by CVTS who recommended CABG procedure next week after gallbladder was drained, abdominal tenderness resolved and nutritional status improved. Because patient's pre-albumin was 10.1 thoracic surgery started the patient on parenteral nutrition after arrival. As part of his pre-CABG evaluation he underwent carotid duplex which did reveal carotid stenosis and subsequently vascular surgery has been consulted for recommendations.  Assessment & Plan:    Sepsis due to Gram negative bacteria/Cholecystitis, acute -Sepsis physiology has resolved -Continue Zosyn and DC vancomycin  -Surgery and IR managing percutaneous drain -Discussed with [name] with general surgery and no indication at this  standpoint to continue NPO status so will begin diet advance (see below) started 8/20 and tolerating well.  -Pain management has been an issue: Begin with IV morphine until proves can tolerate full liquids/more substantial diet before initiating oral narcotics  -prn Tylenol -Encourage continued mobility/OOB to chair q shift and continue working with cardiac rehab.   Diabetes mellitus type 2, - better controlled -Serum glucose this morning was 493 (with associated pseudohyponatremia) and hyperglycemia is likely being exacerbated by parenteral nutrition -Preadmission metformin on hold -Check CBGs before meals/at bedtime and utilize moderate sliding scale insulin, adding prandial novolog 8/20 3 units TIDWC (hold if NPO or eats less than 25% or meal) -Hemoglobin A1c on 8/14 was 6.4      AKI (acute kidney injury) on CKD (chronic kidney disease), stage II -Renal function consistent with acute kidney injury at presentation on 8/14 with a BUN of 31 and a creatinine of 2.4 -Patient was evaluated by nephrology at previous facility who documented acute kidney injury precipitated by sepsis physiology as well as preadmission meds including ARB, diuretic and metformin -Renal function has recovered and is near baseline of 10/1.05 -Continue to follow closely and avoid offending medications    Essential hypertension/ Left ventricular diastolic dysfunction/grade 2 -Current blood pressure moderately controlled and likely being influenced by inadequate pain control -Continue Lopressor    PAF (paroxysmal atrial fibrillation)  -Patient had paroxysmal atrial fibrillation at the initial portion of his admission likely related to sepsis physiology as well as coronary ischemia -Currently maintaining sinus rhythm on amiodarone and beta blockers -Management per cardiology    NSTEMI (non-ST elevated myocardial infarction) /  Coronary artery disease involving native coronary artery of native heart with angina  pectoris /  HLD (hyperlipidemia) -Patient awaiting CABG procedure pending recovery from acute cholecystitis -On beta blocker, statin and  aspirin and full dose heparin IV -Management per cardiology and thoracic surgery      Carotid stenosis -Incidental finding on pre-CABG evaluation -Asymptomatic -Management and surgical recommendations per VVS - see notes    Protein-calorie malnutrition, severe  -Pre-albumin 10.1 -Started on parenteral nutrition on 8/18 by thoracic surgery, DCd 8/20.  -Abdominal x-ray 8/18 without evidence of obstruction -Discussed with general surgery physician assistant -Nutrition consultation -DC TPN and start oral nutrition slowly as he tolerated clears, advance to soft, then further advance as tolerated -Mag and phos within normal limits.     Hypothyroidism -Continue Synthroid -TSH slightly elevated at 4.905 with free T4 only slightly elevated at 1.09 and this is likely reflective of sick euthyroid syndrome    Anxiety state/situational depression/  Alcohol abuse -Patient clearly overwhelmed by current medical status and multiple new medical diagnoses as well as prospect of upcoming surgical procedures -Further distress over lack of control over circumstances and recently quite embarrassed by fecal incontinence with healthcare personnel at the bedside -Continue supportive care -Continue Lexapro -Continue low grade CIWA with Ativan although patient out of the window for alcohol withdrawal syndrome    NICOTINE ADDICTION counseled at bedside and will need ongoing counseling to get him to quit.    DVT prophylaxis: On full dose IV heparin Code Status: Full Family Communication: Multiple family members at bedside including son and Schanz sisters Disposition Plan: At discretion of primary team  Antimicrobials: Anti-infectives    Start     Dose/Rate Route Frequency Ordered Stop   11/10/15 1400  vancomycin (VANCOCIN) 1,250 mg in sodium chloride 0.9 % 250 mL  IVPB  Status:  Discontinued     1,250 mg 166.7 mL/hr over 90 Minutes Intravenous Every 12 hours 11/10/15 1131 11/10/15 1447   11/09/15 1400  vancomycin (VANCOCIN) 1,250 mg in sodium chloride 0.9 % 250 mL IVPB  Status:  Discontinued     1,250 mg 166.7 mL/hr over 90 Minutes Intravenous Every 12 hours 11/09/15 1323 11/10/15 1131   11/08/15 0230  piperacillin-tazobactam (ZOSYN) IVPB 3.375 g     3.375 g 12.5 mL/hr over 240 Minutes Intravenous Every 8 hours 11/07/15 2205     11/07/15 1630  piperacillin-tazobactam (ZOSYN) IVPB 3.375 g  Status:  Discontinued     3.375 g 12.5 mL/hr over 240 Minutes Intravenous Every 8 hours 11/07/15 1553 11/07/15 2205     Subjective: Pt without complaints.  Pt eating well and has been ambulating.    Objective: Vitals:   11/11/15 0400 11/11/15 0500 11/11/15 0859 11/11/15 1200  BP: (!) 144/63  (!) 148/68 (!) 154/73  Pulse: (!) 57  63 63  Resp: _0 Temp: 98.2 F (36.8 C)  97.9 F (36.6 C) 97.8 F (36.6 C)  TempSrc: Oral  Oral Oral  SpO2: 97%  100% 100%  Weight:  98.8 kg (217 lb 13 oz)    Height:        Intake/Output Summary (Last 24 hours) at 11/11/15 1219 Last data filed at 11/11/15 1133  Gross per 24 hour  Intake           913.51 ml  Output             3190 ml  Net         -2276.49 ml   Filed Weights   11/09/15 0328 11/10/15 0400 11/11/15 0500  Weight: 98.6 kg (217 lb 6 oz) 100.7 kg (222 lb 0.1 oz) 98.8 kg (217 lb 13 oz)  Exam: General: Pleasant, NAD. Neuro: Alert and oriented X 3. Moves all extremities spontaneously. HEENT:  Normal sclera clear                   Neck: Supple without bruits or JVD. Lungs:  Resp regular and unlabored, CTA. Heart: RRR no s3, s4, or murmurs.  Abdomen: Soft, non-tender, non-distended, BS + x 4. Perc tube Extremities: No clubbing, cyanosis Psych: Flat Affect.    Data Reviewed: Basic Metabolic Panel:  Recent Labs Lab 11/08/15 0347 11/08/15 2025 11/09/15 0450 11/10/15 0630 11/11/15 0505  NA  129* 131* 127* 131* 135  K 3.6 3.8 4.8 3.8 4.2  CL 99* 100* 100* 97* 102  CO2 21* 21* 20* 26 27  GLUCOSE 110* 115* 493* 235* 120*  BUN _0 CREATININE 1.29* 1.28* 1.15 1.23 1.16  CALCIUM 7.9* 8.0* 7.1* 7.9* 8.0*  MG 1.8  --  1.8  --   --   PHOS  --   --  4.2  --   --    Liver Function Tests:  Recent Labs Lab 11/08/15 0347 11/08/15 2025 11/09/15 0450 11/10/15 0630 11/11/15 0505  AST 26 25 78* 42* 36  ALT 33 32 61 54 55  ALKPHOS 163* 169* 137* 148* 210*  BILITOT 1.3* 1.4* 0.9 0.8 1.0  PROT 5.5* 5.7* 4.8* 5.0* 5.3*  ALBUMIN 2.2* 2.1* 1.9* 1.8* 1.9*    Recent Labs Lab 11/09/15 0450  LIPASE 23   No results for input(s): AMMONIA in the last 168 hours. CBC:  Recent Labs Lab 11/08/15 0347 11/08/15 2025 11/09/15 0450 11/10/15 0630 11/11/15 0505  WBC 13.0* 9.5 15.3* 10.7* 9.6  NEUTROABS 11.4*  --  14.1*  --   --   HGB 10.6* 10.9* 10.1* 9.1* 9.6*  HCT 30.6* 31.3* 29.2* 27.1* 29.0*  MCV 94.2 94.6 97.3 95.8 96.7  PLT 176 196 174 189 220   Cardiac Enzymes:  Recent Labs Lab 11/04/15 1257 11/04/15 2355 11/05/15 0607  TROPONINI 0.04* 0.11* 0.04*   CBG (last 3)   Recent Labs  11/10/15 2030 11/10/15 2310 11/11/15 0900  GLUCAP 164* 130* 123*   Recent Results (from the past 240 hour(s))  Culture, body fluid-bottle     Status: None (Preliminary result)   Collection Time: 11/08/15  6:02 PM  Result Value Ref Range Status   Specimen Description BILE  Final   Special Requests BOTTLES DRAWN AEROBIC AND ANAEROBIC 5CC  Final   Gram Stain   Final    GRAM NEGATIVE RODS IN BOTH AEROBIC AND ANAEROBIC BOTTLES CRITICAL RESULT CALLED TO, READ BACK BY AND VERIFIED WITH: T KEETON,RN _1  11/09/15 MKELLY    Culture   Final    GRAM NEGATIVE RODS IDENTIFICATION AND SUSCEPTIBILITIES TO FOLLOW    Report Status PENDING  Incomplete  Gram stain     Status: None   Collection Time: 11/08/15  6:02 PM  Result Value Ref Range Status   Specimen Description BILE  Final    Special Requests NONE  Final   Gram Stain   Final    ABUNDANT WBC PRESENT,BOTH PMN AND MONONUCLEAR ABUNDANT GRAM NEGATIVE RODS    Report Status 11/08/2015 FINAL  Final  Surgical pcr screen     Status: None   Collection Time: 11/08/15  8:00 PM  Result Value Ref Range Status   MRSA, PCR NEGATIVE NEGATIVE Final   Staphylococcus aureus NEGATIVE NEGATIVE Final    Comment:        The  Xpert SA Assay (FDA approved for NASAL specimens in patients over 24 years of age), is one component of a comprehensive surveillance program.  Test performance has been validated by [facility] for patients greater than or equal to 23 year old. It is not intended to diagnose infection nor to guide or monitor treatment.   Culture, blood (routine x 2)     Status: None (Preliminary result)   Collection Time: 11/08/15  9:19 PM  Result Value Ref Range Status   Specimen Description BLOOD LEFT ANTECUBITAL  Final   Special Requests BOTTLES DRAWN AEROBIC AND ANAEROBIC 5CC  Final   Culture NO GROWTH 2 DAYS  Final   Report Status PENDING  Incomplete  Culture, blood (routine x 2)     Status: None (Preliminary result)   Collection Time: 11/08/15  9:29 PM  Result Value Ref Range Status   Specimen Description BLOOD RIGHT HAND  Final   Special Requests BOTTLES DRAWN AEROBIC AND ANAEROBIC 5CC  Final   Culture NO GROWTH 2 DAYS  Final   Report Status PENDING  Incomplete    Studies: No results found.   Scheduled Meds: . albumin human  25 g Intravenous Q6H  . amiodarone  200 mg Oral BID  . aspirin EC  81 mg Oral Daily  . atorvastatin  40 mg Oral q1800  . escitalopram  10 mg Oral Daily  . feeding supplement (GLUCERNA SHAKE)  237 mL Oral TID BM  . folic acid  1 mg Oral Daily  . furosemide  20 mg Intravenous BID  . insulin aspart  0-20 Units Subcutaneous TID WC  . insulin aspart  0-5 Units Subcutaneous QHS  . insulin aspart  3 Units Subcutaneous TID WC  . levothyroxine  100 mcg Oral QAC breakfast  . magic  mouthwash  10 mL Oral TID  . metoprolol tartrate  50 mg Oral BID  . mometasone-formoterol  2 puff Inhalation BID  . pantoprazole  40 mg Oral Daily  . piperacillin-tazobactam (ZOSYN)  IV  3.375 g Intravenous Q8H  . potassium chloride  20 mEq Oral BID  . thiamine  100 mg Oral Daily   Or  . thiamine  100 mg Intravenous Daily   Continuous Infusions: . heparin 1,800 Units/hr (11/11/15 0622)   Principal Problem:   Sepsis due to Gram negative bacteria (HCC) Active Problems:   Hypothyroidism   Diabetes mellitus type 2, uncontrolled ([place])   Anxiety state   NICOTINE ADDICTION   HLD (hyperlipidemia)   Essential hypertension   Acute cholecystitis without calculus   PAF (paroxysmal atrial fibrillation) (HCC)   AKI (acute kidney injury) ([place])   Aortic atherosclerosis (HCC)   NSTEMI (non-ST elevated myocardial infarction) ([place])   Coronary artery disease involving native coronary artery of native heart with angina pectoris (HCC)   Carotid stenosis   CKD (chronic kidney disease), stage II   Protein-calorie malnutrition, severe (HCC)   Alcohol abuse   Left ventricular diastolic dysfunction/grade 2   Bilateral carotid artery disease ([place])  Time spent:   Irwin Brakeman, MD, FAAFP [facility] Pager [phone]  If 7PM-7AM, please contact night-coverage [email or web address] Password [email or web address] 11/11/2015, 12:19 PM    LOS: 4 days

## 2015-11-11 NOTE — Progress Notes (Signed)
CARDIAC REHAB PHASE I   PRE:  Rate/Rhythm: 65 bigeminy  BP:  Sitting: 157/74        SaO2: 98 2L  MODE:  Ambulation: 300 ft   POST:  Rate/Rhythm: 86 SR  BP:  Sitting: 151/72         SaO2: 97 2L  Pt in bigeminy upon entering room, RN notified, Pt ambulated 300 ft on 2L O2, IV, cholecystostomy tube, assist x2, somewhat wobbly gait, tolerated fairly well. Pt with moderate DOE, brief standing rest x1. Pt would benefit from use of walker for future ambulation. Pt able to follow commands, somewhat flat affect, slow to respond at times, son present, appears baseline. Pt to recliner after walk, feet elevated, call bell within reach. Will follow as x2 for safety/equipment.  [phone] Lenna Sciara, RN, BSN 11/11/2015 11:29 AM

## 2015-11-11 NOTE — Care Management Note (Addendum)
Case Management Note  Patient Details  Name: YOEL KAUFHOLD MRN: [id number] Date of Birth: 01-Dec-1950  Subjective/Objective:     Patient lives with spouse, pta indep, has a rollator at home, hs pcp and insurance for medications, wife will transport at [employer].  Pateint with  NSTEMI, had cath, for CABG/carotid on Thursday, also with cholecystitis has a biliary drain. Wife is insterested in getting handicapped sticker for vehicle.  NCM will cont to follow for dc needs.    Patient is also [facility] patient, for dc needs contact Linda [phone] ext [phone] or Lori ext [phone].          Action/Plan:   Expected Discharge Date:                  Expected Discharge Plan:  [place]  In-House Referral:     Discharge planning Services  CM Consult  Post Acute Care Choice:    Choice offered to:     DME Arranged:    DME Agency:     HH Arranged:    [place] Agency:     Status of Service:  In process, will continue to follow  If discussed at Long Length of Stay Meetings, dates discussed:    Additional Comments:  [name], RN 11/11/2015, 5:07 PM

## 2015-11-11 NOTE — Progress Notes (Signed)
[place] for Heparin Indication: atrial fibrillation and multivessel disease  Allergies  Allergen Reactions  . Bupropion Hcl     REACTION: Severe constipation, insomnia  . Penicillins Other (See Comments)    REACTION: Vomiting Has patient had a PCN reaction causing immediate rash, facial/tongue/throat swelling, SOB or lightheadedness with hypotension: Yes Has patient had a PCN reaction causing severe rash involving mucus membranes or skin necrosis: No Has patient had a PCN reaction that required hospitalization No Has patient had a PCN reaction occurring within the last 10 years: No If all of the above answers are "NO", then may proceed with Cephalosporin use.    Patient Measurements: Height: _0  (170.2 cm) Weight: 222 lb 0.1 oz (100.7 kg) IBW/kg (Calculated) : 66.1 Heparin Dosing Weight: 86.7 kg  Vital Signs: Temp: 98.2 F (36.8 C) (08/21 0400) Temp Source: Oral (08/21 0400) BP: 144/63 (08/21 0400) Pulse Rate: 57 (08/21 0400)  Labs:  Recent Labs  11/09/15 0450 11/09/15 0500 11/10/15 0630 11/11/15 0505  HGB 10.1*  --  9.1* 9.6*  HCT 29.2*  --  27.1* 29.0*  PLT 174  --  189 220  HEPARINUNFRC  --  0.37 0.62 0.93*  CREATININE 1.15  --  1.23 1.16    Estimated Creatinine Clearance: 73.7 mL/min (by C-G formula based on SCr of 1.16 mg/dL).   Assessment: 65 y.o.malewith new onset afib + multivessel disease, awaiting CABG, for heparin   Goal of Therapy:  Heparin level 0.3-0.7 units/ml Monitor platelets by anticoagulation protocol: Yes   Plan:  Decrease Heparin 1800 units/hr Check heparin level in 8 hours.   Phillis Knack, PharmD, BCPS  11/11/2015 6:01 AM

## 2015-11-11 NOTE — Progress Notes (Signed)
   Daily Progress Note  Looks like Dr. Darcey Nora favors proceeding with combine CABG/carotid on Thursday.  Will make arrangements to staff the procedure, as I'm already scheduled.   Adele Barthel, MD [facility] Office: [phone] Pager: [phone]  11/11/2015, 3:05 PM

## 2015-11-11 NOTE — Progress Notes (Addendum)
Procedure(s) (LRB): CORONARY ARTERY BYPASS GRAFTING (CABG) (N/A) TRANSESOPHAGEAL ECHOCARDIOGRAM (TEE) (N/A) Subjective: Acute acalculous cholecystitis, patient developed temperature 102.3 on August 18 after drainage of gallbladder with cholecystostomy tube. Bile  positive for gram-negative rods, identification and sensitivity pending Patient with low albumin, low prealbumin, moderate to severe  protein deficiency malnutrition Chronic three-vessel coronary disease with stress-induced nontransmural MI with sepsis Asymptomatic 90% left carotid stenosis  Patient generally improved but remains weak malnourished and with gram-negative rods in his bile. Overall clinical condition will need improvement before multivessel CABG with combined carotid endarterectomy  Cardiac rehabilitation will see the patient.  Objective: Vital signs in last 24 hours: Temp:  [97.4 F (36.3 C)-98.4 F (36.9 C)] 97.9 F (36.6 C) (08/21 0859) Pulse Rate:  [57-66] 63 (08/21 0859) Cardiac Rhythm: Sinus bradycardia (08/21 0700) Resp:  [15-20] 19 (08/21 0859) BP: (144-160)/(63-81) 148/68 (08/21 0859) SpO2:  [97 %-100 %] 100 % (08/21 0859) Weight:  [217 lb 13 oz (98.8 kg)] 217 lb 13 oz (98.8 kg) (08/21 0500)  Hemodynamic parameters for last 24 hours:  afebrile sinus rhythm  Intake/Output from previous day: 08/20 0701 - 08/21 0700 In: 1633.5 [P.O.:960; I.V.:518.5; IV Piggyback:150] Out: 3500 [Urine:3300; Drains:90] Intake/Output this shift: No intake/output data recorded.  Up in chair Breath sounds reduced at bases from pleural effusions Abdomen distended with improved guarding Golden colored bile from bile drain Neuro intact  Lab Results:  Recent Labs  11/10/15 0630 11/11/15 0505  WBC 10.7* 9.6  HGB 9.1* 9.6*  HCT 27.1* 29.0*  PLT 189 220   BMET:  Recent Labs  11/10/15 0630 11/11/15 0505  NA 131* 135  K 3.8 4.2  CL 97* 102  CO2 26 27  GLUCOSE 235* 120*  BUN 12 9  CREATININE 1.23 1.16   CALCIUM 7.9* 8.0*    PT/INR: No results for input(s): LABPROT, INR in the last 72 hours. ABG No results found for: PHART, HCO3, TCO2, ACIDBASEDEF, O2SAT CBG (last 3)   Recent Labs  11/10/15 2030 11/10/15 2310 11/11/15 0900  GLUCAP 164* 130* 123*    Assessment/Plan: S/P Procedure(s) (LRB): CORONARY ARTERY BYPASS GRAFTING (CABG) (N/A) TRANSESOPHAGEAL ECHOCARDIOGRAM (TEE) (N/A) Acalculous cholecystitis with sepsis Stress-induced nontransmural MI from chronic CAD Transient atrial fib-flutter Protein malnutrition General deconditioning History alcohol abuse  Patient will need CABG with combined left carotid endarterectomy when general medical condition improves. We'll follow. Tentative surgical date is Thursday, August 24   LOS: 4 days    Brian Casey 11/11/2015

## 2015-11-11 NOTE — Progress Notes (Signed)
Patient walked a second time today with staff RN. Pt did a lap around the unit but did start to become a little short of breath and oxygen saturation went into the 80's. Patient otherwise has been on room air and saturations have been in upper 90's.

## 2015-11-11 NOTE — Progress Notes (Addendum)
[place] for Heparin Indication: atrial fibrillation and multivessel disease  Allergies  Allergen Reactions  . Bupropion Hcl     REACTION: Severe constipation, insomnia  . Penicillins Other (See Comments)    REACTION: Vomiting Has patient had a PCN reaction causing immediate rash, facial/tongue/throat swelling, SOB or lightheadedness with hypotension: Yes Has patient had a PCN reaction causing severe rash involving mucus membranes or skin necrosis: No Has patient had a PCN reaction that required hospitalization No Has patient had a PCN reaction occurring within the last 10 years: No If all of the above answers are "NO", then may proceed with Cephalosporin use.    Patient Measurements: Height: 5' 7" (170.2 cm) Weight: 217 lb 13 oz (98.8 kg) IBW/kg (Calculated) : 66.1 Heparin Dosing Weight: 86.7 kg  Vital Signs: Temp: 97.8 F (36.6 C) (08/21 1200) Temp Source: Oral (08/21 1200) BP: 154/73 (08/21 1200) Pulse Rate: 63 (08/21 1200)  Labs:  Recent Labs  11/09/15 0450  11/10/15 0630 11/11/15 0505 11/11/15 1445  HGB 10.1*  --  9.1* 9.6*  --   HCT 29.2*  --  27.1* 29.0*  --   PLT 174  --  189 220  --   HEPARINUNFRC  --   < > 0.62 0.93* 0.68  CREATININE 1.15  --  1.23 1.16  --   < > = values in this interval not displayed.  Estimated Creatinine Clearance: 73 mL/min (by C-G formula based on SCr of 1.16 mg/dL).   Assessment: 65 y.o.malewith new onset afib + multivessel disease, awaiting CABG/carotid planned for Thursday 8/24. Heparin level = 0.68, therapeutic on IV heparin drip at 1800 units/hr.  Hgb low stable at 9.6 and pltc remains within normal limits. No bleeding reported.   Goal of Therapy:  Heparin level 0.3-0.7 units/ml Monitor platelets by anticoagulation protocol: Yes   Plan:  Continue Heparin 1800 units/hr Check heparin level and CBC daily with AM labs  Thank you for allowing pharmacy to be part of this patients care  team. Nicole Cella, RPh Clinical Pharmacist Pager: [phone] 11/11/2015 3:24 PM

## 2015-11-12 ENCOUNTER — Inpatient Hospital Stay (HOSPITAL_COMMUNITY): Payer: Managed Care, Other (non HMO)

## 2015-11-12 DIAGNOSIS — N179 Acute kidney failure, unspecified: Secondary | ICD-10-CM

## 2015-11-12 LAB — COMPREHENSIVE METABOLIC PANEL
ALT: 39 U/L (ref 17–63)
AST: 24 U/L (ref 15–41)
Albumin: 2.8 g/dL — ABNORMAL LOW (ref 3.5–5.0)
Alkaline Phosphatase: 188 U/L — ABNORMAL HIGH (ref 38–126)
Anion gap: 9 (ref 5–15)
BUN: 10 mg/dL (ref 6–20)
CO2: 28 mmol/L (ref 22–32)
Calcium: 8.5 mg/dL — ABNORMAL LOW (ref 8.9–10.3)
Chloride: 98 mmol/L — ABNORMAL LOW (ref 101–111)
Creatinine, Ser: 1.33 mg/dL — ABNORMAL HIGH (ref 0.61–1.24)
GFR calc Af Amer: >60 mL/min (ref >60)
GFR calc non Af Amer: 55 mL/min — ABNORMAL LOW (ref >60)
Glucose, Bld: 111 mg/dL — ABNORMAL HIGH (ref 65–99)
Potassium: 4 mmol/L (ref 3.5–5.1)
Sodium: 135 mmol/L (ref 135–145)
Total Bilirubin: 0.6 mg/dL (ref 0.3–1.2)
Total Protein: 5.9 g/dL — ABNORMAL LOW (ref 6.5–8.1)

## 2015-11-12 LAB — CULTURE, BODY FLUID-BOTTLE

## 2015-11-12 LAB — PROTIME-INR
INR: 1.19
Prothrombin Time: 15.1 seconds (ref 11.4–15.2)

## 2015-11-12 LAB — CBC
HCT: 29.2 % — ABNORMAL LOW (ref 39.0–52.0)
Hemoglobin: 9.8 g/dL — ABNORMAL LOW (ref 13.0–17.0)
MCH: 32.6 pg (ref 26.0–34.0)
MCHC: 33.6 g/dL (ref 30.0–36.0)
MCV: 97 fL (ref 78.0–100.0)
Platelets: 221 10*3/uL (ref 150–400)
RBC: 3.01 MIL/uL — ABNORMAL LOW (ref 4.22–5.81)
RDW: 13.4 % (ref 11.5–15.5)
WBC: 9 10*3/uL (ref 4.0–10.5)

## 2015-11-12 LAB — GLUCOSE, CAPILLARY
GLUCOSE-CAPILLARY: 117 mg/dL — AB (ref 65–99)
GLUCOSE-CAPILLARY: 163 mg/dL — AB (ref 65–99)
GLUCOSE-CAPILLARY: 188 mg/dL — AB (ref 65–99)
GLUCOSE-CAPILLARY: 140 mg/dL — AB (ref 65–99)

## 2015-11-12 LAB — CULTURE, BODY FLUID W GRAM STAIN -BOTTLE

## 2015-11-12 LAB — HEPARIN LEVEL (UNFRACTIONATED): HEPARIN UNFRACTIONATED: 0.54 [IU]/mL (ref 0.30–0.70)

## 2015-11-12 MED ORDER — ALBUMIN HUMAN 25 % IV SOLN
25.0000 g | Freq: Four times a day (QID) | INTRAVENOUS | Status: AC
  Administered 2015-11-12: 25 g via INTRAVENOUS
  Administered 2015-11-12: 12.5 g via INTRAVENOUS
  Filled 2015-11-12: qty 100

## 2015-11-12 MED ORDER — FUROSEMIDE 10 MG/ML IJ SOLN: 20.0000 mg | Freq: Every day | INTRAMUSCULAR | Status: DC

## 2015-11-12 MED ORDER — FUROSEMIDE 20 MG PO TABS
20.0000 mg | ORAL_TABLET | Freq: Every day | ORAL | Status: DC
  Administered 2015-11-12 – 2015-11-13 (×2): 20 mg via ORAL
  Filled 2015-11-12 (×2): qty 1

## 2015-11-12 NOTE — Progress Notes (Signed)
Patient Name: Brian Casey Date of Encounter: 11/12/2015  Hospital Problem List     Principal Problem:   Sepsis due to Gram negative bacteria ([place]) Active Problems:   Hypothyroidism   Diabetes mellitus type 2, uncontrolled ([place])   Anxiety state   NICOTINE ADDICTION   HLD (hyperlipidemia)   Essential hypertension   Acute cholecystitis without calculus   PAF (paroxysmal atrial fibrillation) (HCC)   AKI (acute kidney injury) ([place])   Aortic atherosclerosis (HCC)   NSTEMI (non-ST elevated myocardial infarction) ([place])   Coronary artery disease involving native coronary artery of native heart with angina pectoris (HCC)   Carotid stenosis   CKD (chronic kidney disease), stage II   Protein-calorie malnutrition, severe (HCC)   Alcohol abuse   Left ventricular diastolic dysfunction/grade 2   Bilateral carotid artery disease [facility])    Patient Summary     65 year old gentleman transferred from [facility] after cardiac catheterization found multivessel CAD. Catheterization was done as part of preoperative evaluation for potential cholecystectomy but in a patient has had some exertional chest pain. He also was found to have gram-negative bacteremia and sepsis secondary to acute cholecystitis. He was transferred to [facility] to be evaluated by CT surgery. He was also been found to have left carotid artery disease. As it stands he is currently status post percutaneous drainage tube placement to stabilize his cholecystitis, and plans of to proceed with combined CABG and carotid endarterectomy as early as Thursday of this week as long as he remains afebrile.  Subjective   Still feels well.  Tolerating PO. States that he has been walking.  No CP or SOB.  No SSx of rapid HR.  Inpatient Medications    . amiodarone  200 mg Oral BID  . aspirin EC  81 mg Oral Daily  . atorvastatin  40 mg Oral q1800  . escitalopram  10 mg Oral Daily  . feeding supplement (GLUCERNA SHAKE)  237 mL Oral TID BM  . folic  acid  1 mg Oral Daily  . furosemide  20 mg Oral Daily  . insulin aspart  0-20 Units Subcutaneous TID WC  . insulin aspart  0-5 Units Subcutaneous QHS  . insulin aspart  3 Units Subcutaneous TID WC  . levothyroxine  100 mcg Oral QAC breakfast  . magic mouthwash  10 mL Oral TID  . metoprolol tartrate  50 mg Oral BID  . mometasone-formoterol  2 puff Inhalation BID  . pantoprazole  40 mg Oral Daily  . piperacillin-tazobactam (ZOSYN)  IV  3.375 g Intravenous Q8H  . potassium chloride  20 mEq Oral BID  . thiamine  100 mg Oral Daily   Or  . thiamine  100 mg Intravenous Daily    Vital Signs    Vitals:   11/12/15 1428 11/12/15 1949 11/12/15 2036 11/12/15 2323  BP: (!) 152/67 (!) 157/66  (!) 154/60  Pulse: 62 66  76  Resp: _0 Temp: 97.6 F (36.4 C) 98.2 F (36.8 C)  98.2 F (36.8 C)  TempSrc: Oral Oral  Oral  SpO2: 97% 97% 95% 95%  Weight:      Height:        Intake/Output Summary (Last 24 hours) at 11/12/15 2356 Last data filed at 11/12/15 2200  Gross per 24 hour  Intake          1341.78 ml  Output             1465 ml  Net          -  123.22 ml   Filed Weights   11/10/15 0400 11/11/15 0500 11/12/15 0607  Weight: 222 lb 0.1 oz (100.7 kg) 217 lb 13 oz (98.8 kg) 213 lb 3 oz (96.7 kg)    Physical Exam    GEN: Well nourished, well developed, in no acute distress.  HEENT: normal.  Neck: Supple, no JVD, carotid bruits, or masses. Cardiac: RRR, no murmurs, rubs, or gallops. No clubbing, cyanosis, edema.  Radials/DP/PT 2+ and equal bilaterally.  Respiratory:  Respirations regular and unlabored, clear to auscultation bilaterally. GI: Soft, Appropriately tender in the right upper quadrant. Percutaneous drainage tube in place. CVI Skin: warm and dry, no rash. Psych: Normal affect.  Labs    CBC  Recent Labs  11/11/15 0505 11/12/15 0640  WBC 9.6 9.0  HGB 9.6* 9.8*  HCT 29.0* 29.2*  MCV 96.7 97.0  PLT 220 220   Basic Metabolic Panel  Recent Labs  11/11/15 0505  11/12/15 0640  NA 135 135  K 4.2 4.0  CL 102 98*  CO2 27 28  GLUCOSE 120* 111*  BUN 9 10  CREATININE 1.16 1.33*  CALCIUM 8.0* 8.5*   Liver Function Tests  Recent Labs  11/11/15 0505 11/12/15 0640  AST 36 24  ALT 55 39  ALKPHOS 210* 188*  BILITOT 1.0 0.6  PROT 5.3* 5.9*  ALBUMIN 1.9* 2.8*   No results for input(s): LIPASE, AMYLASE in the last 72 hours. Cardiac Enzymes No results for input(s): CKTOTAL, CKMB, CKMBINDEX, TROPONINI in the last 72 hours. BNP Invalid input(s): POCBNP D-Dimer No results for input(s): DDIMER in the last 72 hours. Hemoglobin A1C No results for input(s): HGBA1C in the last 72 hours. Fasting Lipid Panel No results for input(s): CHOL, HDL, LDLCALC, TRIG, CHOLHDL, LDLDIRECT in the last 72 hours. Thyroid Function Tests No results for input(s): TSH, T4TOTAL, T3FREE, THYROIDAB in the last 72 hours.  Invalid input(s): FREET3  Telemetry    Mostly sinus rhythm   ECG     No recent EKG  Cardiology    Cardiac cath 11/06/2015 at [facility]:  Conclusion    Dist RCA lesion, 95 %stenosed.  Ramus lesion, 90 %stenosed.  Ost Cx lesion, 95 %stenosed.  Mid LAD lesion, 70 %stenosed.  Dist LAD lesion, 60 %stenosed.  Ost LM to LM lesion, 40 %stenosed.    2-D echo 25/42/7062: Normal systolic function with EF 50-55%. Grade 2 diastolic dysfunction/pseudo-normal filling. Otherwise normal  Radiology    No new studies  Assessment & Plan    65 y.o.malewith h/o DM2, HTN, HLD, hypothyroidisim, and chronic alcohol abuse presented to [facility] on 11/04/15 with RUQ abdominal and was found to have acute cholecystitis on CT scan as well as new onset Afib with RVR with heart rates in the 180's bpm. He underwent myoview for pre op risk stratification which was high risk and subsequent LHC showing severe 4VCAD. He was transferred from [facility] to [facility] on 11/07/15 for evaluation by CTCS. Dr. Darcey Nora plans on surgery later this week.   Severe multivessel CAD: severe 4V CAD.  Plan is for CABG later this week. Continue ASA, BB and statin.   -- Has mild volume overload after treatment of his sepsis, now currently on IV Lasix. May convert to oral Lasix .  B carotid stenosis / LEFT ICA origin,luminal diameter of less than 1 mm, estimated 90% stenosis or greater.: Dr. Bridgett Larsson has evaluated and will discuss about potential staged procedure versus combined approach.    PAF: was in afib with RVR on admission and  placed on amio gtt. He has had short bursts of RVR since.  He has since converted to NSR and now on PO amiodarone 280m BID. Lopressor to 554mBID.  -CHADS2VASc at least 3 (HTN, DM, vascular disease) -Heparin gtt, transition to DOKempost op -Telemetry now shows brief paroxysmal atrial tachycardia. Mostly sinus rhythm. Continue beta blocker and amiodarone. -Mildly anemic. Watch with heparin.  Cholecystitis: surgery deferred at this time. Continue Zosyn. Also on vancomycin which may be able to be stopped if no evidence of gram-positive activity.  PICC line for TPN. Surgery placed perc tube for drainage. Feels better. Currently tolerating clears fairly well. Appreciate hospitalist consultation with AlErin HearingNP (family also appreciated as well).   [facility]  HTN:  lopressor 5074mID. Relatively well-controlled. As his cholecystitis improves, I suspect his BP may continue to increase.  Would consider ACE-I/ARB post-op CABG as long as renal Fxn remains stable  HLD: continue Lipitor. Last lipid panel 10/2014 and LDL 88  DM: HgA1c 6.4, hyperglycemia likely exacerbated by parenteral nutrition. Appreciate TRH guidance.  Pain: Much better controlled, appreciate TRH. Signed,  DavGlenetta Hew.D., M.S. Interventional Cardiologist   Pager # [phone] # [phone] [address]walke[address]xvilleC [id number]

## 2015-11-12 NOTE — Progress Notes (Signed)
CARDIAC REHAB PHASE I   PRE:  Rate/Rhythm: 48 SR  BP:  Sitting: 148/68        SaO2: 98 RA  MODE:  Ambulation: 600 ft   POST:  Rate/Rhythm: 76 SR  BP:  Sitting: 148/66         SaO2: 96 2L  Pt ambulated 600 ft on 2L O2, rolling walker, IV, chole tube, assist x2 for safety/equipment, fairly steady gait, tolerated well. Pt denies any complaints. Pt eager to walk, appreciative. Pt much steadier today with use of RW. Pt to recliner after walk, feet elevated, call bell within reach. Will continue to follow.   [id number] Lenna Sciara, RN, BSN 11/12/2015 11:33 AM

## 2015-11-12 NOTE — Progress Notes (Signed)
Procedure(s) (LRB): CORONARY ARTERY BYPASS GRAFTING (CABG) (N/A) TRANSESOPHAGEAL ECHOCARDIOGRAM (TEE) (N/A) Subjective: Walking but weak and with O2 desats 120 cc of bile drainage ID of gram negative rod in bile still pending Oral intake, albumin levels better  Objective: Vital signs in last 24 hours: Temp:  [97.5 F (36.4 C)-98.2 F (36.8 C)] 97.9 F (36.6 C) (08/22 0750) Pulse Rate:  [53-108] 53 (08/22 0750) Cardiac Rhythm: Normal sinus rhythm (08/22 0000) Resp:  [15-22] 15 (08/22 0750) BP: (127-159)/(47-84) 159/69 (08/22 0750) SpO2:  [91 %-100 %] 93 % (08/22 0750) Weight:  [213 lb 3 oz (96.7 kg)] 213 lb 3 oz (96.7 kg) (08/22 0607)  Hemodynamic parameters for last 24 hours:  stable  Intake/Output from previous day: 08/21 0701 - 08/22 0700 In: 1126.9 [P.O.:360; I.V.:411.9; IV Piggyback:350] Out: 0929 [Urine:3750; Drains:115] Intake/Output this shift: No intake/output data recorded.       Exam    General- alert and comfortable   Lungs- clear without rales, wheezes   Cor- regular rate and rhythm, no murmur , gallop   Abdomen- soft, non-tender   Extremities - warm, non-tender, minimal edema   Neuro- oriented, appropriate, no focal weakness   Lab Results:  Recent Labs  11/11/15 0505 11/12/15 0640  WBC 9.6 9.0  HGB 9.6* 9.8*  HCT 29.0* 29.2*  PLT 220 221   BMET:  Recent Labs  11/11/15 0505 11/12/15 0640  NA 135 135  K 4.2 4.0  CL 102 98*  CO2 27 28  GLUCOSE 120* 111*  BUN 9 10  CREATININE 1.16 1.33*  CALCIUM 8.0* 8.5*    PT/INR:  Recent Labs  11/12/15 0640  LABPROT 15.1  INR 1.19   ABG No results found for: PHART, HCO3, TCO2, ACIDBASEDEF, O2SAT CBG (last 3)   Recent Labs  11/11/15 1611 11/11/15 2129 11/12/15 0748  GLUCAP 181* 94 117*    Assessment/Plan: S/P Procedure(s) (LRB): CORONARY ARTERY BYPASS GRAFTING (CABG) (N/A) TRANSESOPHAGEAL ECHOCARDIOGRAM (TEE) (N/A) Plan CABG- carotid on thursday   LOS: 5 days    Tharon Aquas  Trigt III 11/12/2015

## 2015-11-12 NOTE — Progress Notes (Signed)
Daily Progress Note  Transient period of bacteremia appears to have resolved with abx and cholecystostomy tube.  Pt on schedule for combined CABG/L CEA.  My partner Dr. Donzetta Matters will be the attending for the L CEA portion of the case.    - Will come by tomorrow to discuss details of the CEA  Adele Barthel, MD [facility] Office: [phone] Pager: [phone]  11/12/2015, 2:17 PM

## 2015-11-12 NOTE — Progress Notes (Signed)
Patient Name: Brian Casey Date of Encounter: 11/12/2015  Hospital Problem List     Principal Problem:   Sepsis due to Gram negative bacteria ([place]) Active Problems:   Hypothyroidism   Diabetes mellitus type 2, uncontrolled ([place])   Anxiety state   NICOTINE ADDICTION   HLD (hyperlipidemia)   Essential hypertension   Acute cholecystitis without calculus   PAF (paroxysmal atrial fibrillation) (HCC)   AKI (acute kidney injury) ([place])   Aortic atherosclerosis (HCC)   NSTEMI (non-ST elevated myocardial infarction) ([place])   Coronary artery disease involving native coronary artery of native heart with angina pectoris (HCC)   Carotid stenosis   CKD (chronic kidney disease), stage II   Protein-calorie malnutrition, severe (HCC)   Alcohol abuse   Left ventricular diastolic dysfunction/grade 2   Bilateral carotid artery disease [facility])    Patient Summary     65 year old gentleman transferred from [facility] after cardiac catheterization found multivessel CAD. Catheterization was done as part of preoperative evaluation for potential cholecystectomy but in a patient has had some exertional chest pain. He also was found to have gram-negative bacteremia and sepsis secondary to acute cholecystitis. He was transferred to [facility] to be evaluated by CT surgery. He was also been found to have left carotid artery disease. As it stands he is currently status post percutaneous drainage tube placement to stabilize his cholecystitis, and plans of to proceed with combined CABG and carotid endarterectomy as early as Thursday of this week as long as he remains afebrile.  Subjective   He feels great today no significant abdominal pain not had any chest tightness or pressure. No dyspnea unless his heart rate goes fast. He has a good mental outlook and is ready to go forward with the surgeries.  Inpatient Medications    . amiodarone  200 mg Oral BID  . aspirin EC  81 mg Oral Daily  . atorvastatin  40 mg Oral  q1800  . escitalopram  10 mg Oral Daily  . feeding supplement (GLUCERNA SHAKE)  237 mL Oral TID BM  . folic acid  1 mg Oral Daily  . furosemide  20 mg Intravenous BID  . insulin aspart  0-20 Units Subcutaneous TID WC  . insulin aspart  0-5 Units Subcutaneous QHS  . insulin aspart  3 Units Subcutaneous TID WC  . levothyroxine  100 mcg Oral QAC breakfast  . magic mouthwash  10 mL Oral TID  . metoprolol tartrate  50 mg Oral BID  . mometasone-formoterol  2 puff Inhalation BID  . pantoprazole  40 mg Oral Daily  . piperacillin-tazobactam (ZOSYN)  IV  3.375 g Intravenous Q8H  . potassium chloride  20 mEq Oral BID  . thiamine  100 mg Oral Daily   Or  . thiamine  100 mg Intravenous Daily    Vital Signs    Vitals:   11/11/15 1610 11/11/15 2000 11/11/15 2121 11/11/15 2359  BP: (!) 139/59  (!) 151/66 (!) 152/84  Pulse: 62  61 (!) 108  Resp: 19   (!) 22  Temp: 98 F (36.7 C) 97.5 F (36.4 C) 97.7 F (36.5 C) 97.7 F (36.5 C)  TempSrc: Oral Oral Oral Oral  SpO2: 97%   93%  Weight:      Height:        Intake/Output Summary (Last 24 hours) at 11/12/15 0000 Last data filed at 11/11/15 1900  Gross per 24 hour  Intake  930 ml  Output             4005 ml  Net            -3075 ml   Filed Weights   11/09/15 0328 11/10/15 0400 11/11/15 0500  Weight: 217 lb 6 oz (98.6 kg) 222 lb 0.1 oz (100.7 kg) 217 lb 13 oz (98.8 kg)    Physical Exam    GEN: Well nourished, well developed, in no acute distress.  HEENT: normal.  Neck: Supple, no JVD, carotid bruits, or masses. Cardiac: RRR, no murmurs, rubs, or gallops. No clubbing, cyanosis, edema.  Radials/DP/PT 2+ and equal bilaterally.  Respiratory:  Respirations regular and unlabored, clear to auscultation bilaterally. GI: Soft, Appropriately tender in the right upper quadrant. Percutaneous drainage tube in place. CVI Skin: warm and dry, no rash. Psych: Normal affect.  Labs    CBC  Recent Labs  11/09/15 0450 11/10/15 0630  11/11/15 0505  WBC 15.3* 10.7* 9.6  NEUTROABS 14.1*  --   --   HGB 10.1* 9.1* 9.6*  HCT 29.2* 27.1* 29.0*  MCV 97.3 95.8 96.7  PLT 174 189 098   Basic Metabolic Panel  Recent Labs  11/09/15 0450 11/10/15 0630 11/11/15 0505  NA 127* 131* 135  K 4.8 3.8 4.2  CL 100* 97* 102  CO2 20* 26 27  GLUCOSE 493* 235* 120*  BUN _0 CREATININE 1.15 1.23 1.16  CALCIUM 7.1* 7.9* 8.0*  MG 1.8  --   --   PHOS 4.2  --   --    Liver Function Tests  Recent Labs  11/10/15 0630 11/11/15 0505  AST 42* 36  ALT 54 55  ALKPHOS 148* 210*  BILITOT 0.8 1.0  PROT 5.0* 5.3*  ALBUMIN 1.8* 1.9*    Recent Labs  11/09/15 0450  LIPASE 23   Cardiac Enzymes No results for input(s): CKTOTAL, CKMB, CKMBINDEX, TROPONINI in the last 72 hours. BNP Invalid input(s): POCBNP D-Dimer No results for input(s): DDIMER in the last 72 hours. Hemoglobin A1C No results for input(s): HGBA1C in the last 72 hours. Fasting Lipid Panel  Recent Labs  11/09/15 0450  TRIG 437*   Thyroid Function Tests  Recent Labs  11/09/15 0822  TSH 4.905*    Telemetry    Mostly sinus rhythm with very short runs of PAT/PAF.  ECG     No recent EKG  Cardiology    Cardiac [facility] 11/06/2015 at [facility]:  Conclusion    Dist RCA lesion, 95 %stenosed.  Ramus lesion, 90 %stenosed.  Ost Cx lesion, 95 %stenosed.  Mid LAD lesion, 70 %stenosed.  Dist LAD lesion, 60 %stenosed.  Ost LM to LM lesion, 40 %stenosed.    2-D echo 28/67/5198: Normal systolic function with EF 50-55%. Grade 2 diastolic dysfunction/pseudo-normal filling. Otherwise normal  Radiology    No new studies  Assessment & Plan    65 y.o.malewith h/o DM2, HTN, HLD, hypothyroidisim, and chronic alcohol abuse presented to [facility] on 11/04/15 with RUQ abdominal and was found to have acute cholecystitis on CT scan as well as new onset Afib with RVR with heart rates in the 180's bpm. He underwent myoview for pre op risk stratification which was high  risk and subsequent LHC showing severe 4VCAD. He was transferred from [facility] to [facility] on 11/07/15 for evaluation by CTCS. Dr. Darcey Nora plans on surgery later this week.   Severe multivessel CAD: severe 4V CAD. Plan is for CABG later this week. Continue ASA, BB  and statin.   -- Has mild volume overload after treatment of his sepsis, now currently on IV Lasix. May convert to oral Lasix tomorrow.  B carotid stenosis / LEFT ICA origin,luminal diameter of less than 1 mm, estimated 90% stenosis or greater.: Dr. Bridgett Larsson has evaluated and will discuss about potential staged procedure versus combined approach.    PAF: was in afib with RVR on admission and placed on amio gtt. He has had short bursts of RVR since.  He has since converted to NSR and now on PO amiodarone 27m BID. Lopressor to 563mBID.  -CHADS2VASc at least 3 (HTN, DM, vascular disease) -Heparin gtt, transition to DOLaresost op -Telemetry now shows brief paroxysmal atrial tachycardia. Mostly sinus rhythm. Continue beta blocker and amiodarone. -Mildly anemic. Watch with heparin.  Cholecystitis: surgery deferred at this time. Continue Zosyn. Also on vancomycin which may be able to be stopped if no evidence of gram-positive activity.  PICC line for TPN. Surgery placed perc tube for drainage. Feels better. Currently tolerating clears fairly well. Appreciate hospitalist consultation with AlErin HearingNP (family also appreciated as well).   [facility]an[facility]  HTN:  lopressor 5076mID. Relatively well-controlled.  HLD: continue Lipitor. Last lipid panel 10/2014 and LDL 88  DM: HgA1c 6.4, hyperglycemia likely exacerbated by parenteral nutrition. Appreciate TRH guidance.  Pain: Much better controlled, appreciate TRH. Signed,  DavGlenetta Hew.D., M.S. Interventional Cardiologist   Pager # [phone] # [phone] [address] [id number]

## 2015-11-12 NOTE — Progress Notes (Signed)
ANTICOAGULATION CONSULT NOTE - Follow Up Consult  Pharmacy Consult for Heparin Indication: atrial fibrillation and multivessel CAD  Allergies  Allergen Reactions  . Bupropion Hcl     REACTION: Severe constipation, insomnia  . Penicillins Other (See Comments)    REACTION: Vomiting Has patient had a PCN reaction causing immediate rash, facial/tongue/throat swelling, SOB or lightheadedness with hypotension: Yes Has patient had a PCN reaction causing severe rash involving mucus membranes or skin necrosis: No Has patient had a PCN reaction that required hospitalization No Has patient had a PCN reaction occurring within the last 10 years: No If all of the above answers are "NO", then may proceed with Cephalosporin use.    Patient Measurements: Height: _0  (170.2 cm) Weight: 213 lb 3 oz (96.7 kg) IBW/kg (Calculated) : 66.1 Heparin Dosing Weight: 87 kg  Vital Signs: Temp: 97.9 F (36.6 C) (08/22 0750) Temp Source: Oral (08/22 0750) BP: 159/69 (08/22 0750) Pulse Rate: 53 (08/22 0750)  Labs:  Recent Labs  11/10/15 0630 11/11/15 0505 11/11/15 1445 11/12/15 0640  HGB 9.1* 9.6*  --  9.8*  HCT 27.1* 29.0*  --  29.2*  PLT 189 220  --  221  LABPROT  --   --   --  15.1  INR  --   --   --  1.19  HEPARINUNFRC 0.62 0.93* 0.68 0.54  CREATININE 1.23 1.16  --  1.33*    Estimated Creatinine Clearance: 63 mL/min (by C-G formula based on SCr of 1.33 mg/dL).  Assessment:   65 y.o.malewith new onset afib + multivessel CAD, awaiting CABG/carotid planned for Thursday 8/24.   Heparin level remains therapeutic (0.54) on 1800 units/hr. CBC stable, no bleeding reported.  Goal of Therapy:  Heparin level 0.3-0.7 units/ml Monitor platelets by anticoagulation protocol: Yes   Plan:   Continue heparin drip at 1800 units/hr.  Daily heparin level and CBC.  For CABG/carotid on 8/24.  Arty Baumgartner, Center Pager: [phone] 11/12/2015,11:05 AM

## 2015-11-12 NOTE — Progress Notes (Signed)
PROGRESS NOTE    Brian Casey  NWG:[id number]  DOB: 09/16/1950  DOA: 11/07/2015 PCP: [name], MD Outpatient Specialists:   Hospital course: HPI: Brian Casey is a 65 y.o. male with medical history significant for hypothyroidism, diabetes on metformin, chronic alcohol abuse, history of depression and ongoing tobacco abuse. The patient initially presented to the [facility] ER and was admitted on 8/14 with a diagnosis of likely cholecystitis. He presented with sepsis physiology as well as new onset atrial fibrillation and acute kidney injury. During the hospitalization patient had positive troponins so a stress test was obtained and was positive. Patient later underwent cardiac catheterization which revealed multivessel CAD. He was also formally diagnosed with cholecystitis. His sepsis was treated with empiric broad-spectrum antibiotics. Nephrology was consulted at [facility] for his acute kidney injury. He was eventually transferred to this facility where he was evaluated by general surgery who felt the patient was a poor surgical candidate at this time and the patient was subsequently taken to interventional radiology where a percutaneous cholecystostomy tube was placed. He was also evaluated by CVTS who recommended CABG procedure next week after gallbladder was drained, abdominal tenderness resolved and nutritional status improved. Because patient's pre-albumin was 10.1 thoracic surgery started the patient on parenteral nutrition after arrival. As part of his pre-CABG evaluation he underwent carotid duplex which did reveal carotid stenosis and subsequently vascular surgery has been consulted for recommendations.  Assessment & Plan:    Sepsis due to Gram negative bacteria/Cholecystitis, acute -Sepsis physiology has resolved -Continue Zosyn and DC vancomycin  -Surgery and IR managing percutaneous drain -Discussed with [name] with general surgery and no indication at this  standpoint to continue NPO status so will begin diet advance (see below) started 8/20 and tolerating well.  -Pain management has been an issue: Begin with IV morphine until proves can tolerate full liquids/more substantial diet before initiating oral narcotics  -prn Tylenol -Encourage continued mobility/OOB to chair q shift and continue working with cardiac rehab.   Diabetes mellitus type 2, - better controlled -Serum glucose this morning was 493 (with associated pseudohyponatremia) and hyperglycemia is likely being exacerbated by parenteral nutrition -Preadmission metformin on hold -Check CBGs before meals/at bedtime and utilize moderate sliding scale insulin, adding prandial novolog 8/20 -  3 units TIDWC (hold if NPO or eats less than 25% or meal) -Hemoglobin A1c on 8/14 was 6.4    CBG (last 3)   Recent Labs  11/11/15 2129 11/12/15 0748 11/12/15 1118  GLUCAP 94 117* 163*     AKI (acute kidney injury) on CKD (chronic kidney disease), stage II -Renal function consistent with acute kidney injury at presentation on 8/14 with a BUN of 31 and a creatinine of 2.4 -Patient was evaluated by nephrology at previous facility who documented acute kidney injury precipitated by sepsis physiology as well as preadmission meds including ARB, diuretic and metformin -Renal function has recovered and is near baseline of 10/1.05 -Continue to follow closely and avoid offending medications    Essential hypertension/ Left ventricular diastolic dysfunction/grade 2 -Current blood pressure moderately controlled and likely being influenced by inadequate pain control -Continue Lopressor    PAF (paroxysmal atrial fibrillation)  -Patient had paroxysmal atrial fibrillation at the initial portion of his admission likely related to sepsis physiology as well as coronary ischemia -Currently maintaining sinus rhythm on amiodarone and beta blockers -Management per cardiology    NSTEMI (non-ST elevated myocardial  infarction) /  Coronary artery disease involving native coronary artery of  native heart with angina pectoris /  HLD (hyperlipidemia) -Patient awaiting CABG procedure pending recovery from acute cholecystitis -On beta blocker, statin and aspirin and full dose heparin IV -Management per cardiology and thoracic surgery      Carotid stenosis -Incidental finding on pre-CABG evaluation -Asymptomatic -Management and surgical recommendations per VVS - see notes    Protein-calorie malnutrition, severe  -Pre-albumin 10.1 -Started on parenteral nutrition on 8/18 by thoracic surgery, DCd 8/20.  -Abdominal x-ray 8/18 without evidence of obstruction -Discussed with general surgery physician assistant -Nutrition consultation -DC TPN and start oral nutrition slowly as he tolerated clears, advance to soft, now tolerating heart healthy carb modified -Mag and phos within normal limits.     Hypothyroidism -Continue Synthroid -TSH slightly elevated at 4.905 with free T4 only slightly elevated at 1.09 and this is likely reflective of sick euthyroid syndrome    Anxiety state/situational depression/  Alcohol abuse -Patient clearly overwhelmed by current medical status and multiple new medical diagnoses as well as prospect of upcoming surgical procedures -Further distress over lack of control over circumstances and recently quite embarrassed by fecal incontinence with healthcare personnel at the bedside -Continue supportive care -Continue Lexapro -Continue low grade CIWA with Ativan although patient out of the window for alcohol withdrawal syndrome    NICOTINE ADDICTION counseled at bedside and will need ongoing counseling to get him to quit.    DVT prophylaxis: On full dose IV heparin Code Status: Full Family Communication: Multiple family members at bedside including son and Schanz sisters Disposition Plan: At discretion of primary team  Antimicrobials: Anti-infectives    Start     Dose/Rate  Route Frequency Ordered Stop   11/10/15 1400  vancomycin (VANCOCIN) 1,250 mg in sodium chloride 0.9 % 250 mL IVPB  Status:  Discontinued     1,250 mg 166.7 mL/hr over 90 Minutes Intravenous Every 12 hours 11/10/15 1131 11/10/15 1447   11/09/15 1400  vancomycin (VANCOCIN) 1,250 mg in sodium chloride 0.9 % 250 mL IVPB  Status:  Discontinued     1,250 mg 166.7 mL/hr over 90 Minutes Intravenous Every 12 hours 11/09/15 1323 11/10/15 1131   11/08/15 0230  piperacillin-tazobactam (ZOSYN) IVPB 3.375 g     3.375 g 12.5 mL/hr over 240 Minutes Intravenous Every 8 hours 11/07/15 2205     11/07/15 1630  piperacillin-tazobactam (ZOSYN) IVPB 3.375 g  Status:  Discontinued     3.375 g 12.5 mL/hr over 240 Minutes Intravenous Every 8 hours 11/07/15 1553 11/07/15 2205     Subjective: Pt without complaints.  Pt eating well and has been ambulating.  Blood sugars better controlled now.   Objective: Vitals:   11/12/15 0607 11/12/15 0750 11/12/15 1111 11/12/15 1136  BP:  (!) 159/69  (!) 148/66  Pulse:  (!) 53  (!) 58  Resp:  15  16  Temp:  97.9 F (36.6 C)  97.6 F (36.4 C)  TempSrc:  Oral  Oral  SpO2:  93% 97% 96%  Weight: 96.7 kg (213 lb 3 oz)     Height:        Intake/Output Summary (Last 24 hours) at 11/12/15 1242 Last data filed at 11/12/15 0700  Gross per 24 hour  Intake          1126.88 ml  Output             2965 ml  Net         -1838.12 ml   Filed Weights   11/10/15 0400 11/11/15 0500  11/12/15 0607  Weight: 100.7 kg (222 lb 0.1 oz) 98.8 kg (217 lb 13 oz) 96.7 kg (213 lb 3 oz)    Exam: General: Pleasant, NAD. Neuro: Alert and oriented X 3. Moves all extremities spontaneously. HEENT:  Normal sclera clear                   Neck: Supple without bruits or JVD. Lungs:  Resp regular and unlabored, CTA. Heart: RRR no s3, s4, or murmurs.  Abdomen: Soft, non-tender, non-distended, BS + x 4. Perc tube Extremities: No clubbing, cyanosis Psych: Flat Affect.    Data Reviewed: Basic  Metabolic Panel:  Recent Labs Lab 11/08/15 0347 11/08/15 2025 11/09/15 0450 11/10/15 0630 11/11/15 0505 11/12/15 0640  NA 129* 131* 127* 131* 135 135  K 3.6 3.8 4.8 3.8 4.2 4.0  CL 99* 100* 100* 97* 102 98*  CO2 21* 21* 20* _0 GLUCOSE 110* 115* 493* 235* 120* 111*  BUN _1 CREATININE 1.29* 1.28* 1.15 1.23 1.16 1.33*  CALCIUM 7.9* 8.0* 7.1* 7.9* 8.0* 8.5*  MG 1.8  --  1.8  --   --   --   PHOS  --   --  4.2  --   --   --    Liver Function Tests:  Recent Labs Lab 11/08/15 2025 11/09/15 0450 11/10/15 0630 11/11/15 0505 11/12/15 0640  AST 25 78* 42* 36 24  ALT 32 61 54 55 39  ALKPHOS 169* 137* 148* 210* 188*  BILITOT 1.4* 0.9 0.8 1.0 0.6  PROT 5.7* 4.8* 5.0* 5.3* 5.9*  ALBUMIN 2.1* 1.9* 1.8* 1.9* 2.8*    Recent Labs Lab 11/09/15 0450  LIPASE 23   No results for input(s): AMMONIA in the last 168 hours. CBC:  Recent Labs Lab 11/08/15 0347 11/08/15 2025 11/09/15 0450 11/10/15 0630 11/11/15 0505 11/12/15 0640  WBC 13.0* 9.5 15.3* 10.7* 9.6 9.0  NEUTROABS 11.4*  --  14.1*  --   --   --   HGB 10.6* 10.9* 10.1* 9.1* 9.6* 9.8*  HCT 30.6* 31.3* 29.2* 27.1* 29.0* 29.2*  MCV 94.2 94.6 97.3 95.8 96.7 97.0  PLT 176 196 174 189 220 221   Cardiac Enzymes: No results for input(s): CKTOTAL, CKMB, CKMBINDEX, TROPONINI in the last 168 hours. CBG (last 3)   Recent Labs  11/11/15 2129 11/12/15 0748 11/12/15 1118  GLUCAP 94 117* 163*   Recent Results (from the past 240 hour(s))  Culture, body fluid-bottle     Status: None (Preliminary result)   Collection Time: 11/08/15  6:02 PM  Result Value Ref Range Status   Specimen Description BILE  Final   Special Requests BOTTLES DRAWN AEROBIC AND ANAEROBIC 5CC  Final   Gram Stain   Final    GRAM NEGATIVE RODS IN BOTH AEROBIC AND ANAEROBIC BOTTLES CRITICAL RESULT CALLED TO, READ BACK BY AND VERIFIED WITH: T KEETON,RN _2  11/09/15 MKELLY    Culture   Final    GRAM NEGATIVE RODS IDENTIFICATION AND  SUSCEPTIBILITIES TO FOLLOW    Report Status PENDING  Incomplete  Gram stain     Status: None   Collection Time: 11/08/15  6:02 PM  Result Value Ref Range Status   Specimen Description BILE  Final   Special Requests NONE  Final   Gram Stain   Final    ABUNDANT WBC PRESENT,BOTH PMN AND MONONUCLEAR ABUNDANT GRAM NEGATIVE RODS    Report Status 11/08/2015 FINAL  Final  Surgical pcr  screen     Status: None   Collection Time: 11/08/15  8:00 PM  Result Value Ref Range Status   MRSA, PCR NEGATIVE NEGATIVE Final   Staphylococcus aureus NEGATIVE NEGATIVE Final    Comment:        The Xpert SA Assay (FDA approved for NASAL specimens in patients over 85 years of age), is one component of a comprehensive surveillance program.  Test performance has been validated by [facility] for patients greater than or equal to 25 year old. It is not intended to diagnose infection nor to guide or monitor treatment.   Culture, blood (routine x 2)     Status: None (Preliminary result)   Collection Time: 11/08/15  9:19 PM  Result Value Ref Range Status   Specimen Description BLOOD LEFT ANTECUBITAL  Final   Special Requests BOTTLES DRAWN AEROBIC AND ANAEROBIC 5CC  Final   Culture NO GROWTH 3 DAYS  Final   Report Status PENDING  Incomplete  Culture, blood (routine x 2)     Status: None (Preliminary result)   Collection Time: 11/08/15  9:29 PM  Result Value Ref Range Status   Specimen Description BLOOD RIGHT HAND  Final   Special Requests BOTTLES DRAWN AEROBIC AND ANAEROBIC 5CC  Final   Culture NO GROWTH 3 DAYS  Final   Report Status PENDING  Incomplete    Studies: Dg Chest 2 View  Result Date: 11/12/2015 CLINICAL DATA:  History of gastroesophageal reflux, hypertension, diabetes, coronary artery disease, current smoker. The patient underwent cholecystostomy on November 08, 2015. EXAM: CHEST  2 VIEW COMPARISON:  PA and lateral chest x-ray of November 07, 2015 FINDINGS: There remains mild volume loss on the  right. The left lung is better inflated. There is persistent left basilar density. There are small bilateral pleural effusions layering posterior. The cardiac silhouette remains enlarged. The central pulmonary vascularity is engorged. There is calcification in the wall of the aortic arch. The PICC line tip projects over the midportion of the SVC. The bony thorax exhibits no acute abnormality. IMPRESSION: Mild CHF with bilateral pleural effusions. Left lower lobe atelectasis is suspected as well. No significant change since the earlier study. Electronically Signed   By: David  Martinique M.D.   On: 11/12/2015 07:48     Scheduled Meds: . albumin human  25 g Intravenous Q6H  . amiodarone  200 mg Oral BID  . aspirin EC  81 mg Oral Daily  . atorvastatin  40 mg Oral q1800  . escitalopram  10 mg Oral Daily  . feeding supplement (GLUCERNA SHAKE)  237 mL Oral TID BM  . folic acid  1 mg Oral Daily  . [START ON 11/13/2015] furosemide  20 mg Intravenous Daily  . insulin aspart  0-20 Units Subcutaneous TID WC  . insulin aspart  0-5 Units Subcutaneous QHS  . insulin aspart  3 Units Subcutaneous TID WC  . levothyroxine  100 mcg Oral QAC breakfast  . magic mouthwash  10 mL Oral TID  . metoprolol tartrate  50 mg Oral BID  . mometasone-formoterol  2 puff Inhalation BID  . pantoprazole  40 mg Oral Daily  . piperacillin-tazobactam (ZOSYN)  IV  3.375 g Intravenous Q8H  . potassium chloride  20 mEq Oral BID  . thiamine  100 mg Oral Daily   Or  . thiamine  100 mg Intravenous Daily   Continuous Infusions: . heparin 1,800 Units/hr (11/12/15 1607)   Principal Problem:   Sepsis due to Gram negative bacteria (  [place]) Active Problems:   Hypothyroidism   Diabetes mellitus type 2, uncontrolled ([place])   Anxiety state   NICOTINE ADDICTION   HLD (hyperlipidemia)   Essential hypertension   Acute cholecystitis without calculus   PAF (paroxysmal atrial fibrillation) ([facility])   AKI (acute kidney injury) ([place])   Aortic  atherosclerosis ([facility])   NSTEMI (non-ST elevated myocardial infarction) ([place])   Coronary artery disease involving native coronary artery of native heart with angina pectoris ([facility])   Carotid stenosis   CKD (chronic kidney disease), stage II   Protein-calorie malnutrition, severe ([facility])   Alcohol abuse   Left ventricular diastolic dysfunction/grade 2   Bilateral carotid artery disease ([place])  Time spent:   Irwin Brakeman, MD, FAAFP [facility] Pager [phone]  If 7PM-7AM, please contact night-coverage [email or web address] Password [email or web address] 11/12/2015, 12:42 PM    LOS: 5 days

## 2015-11-13 ENCOUNTER — Encounter (HOSPITAL_COMMUNITY): Payer: Self-pay | Admitting: Radiology

## 2015-11-13 ENCOUNTER — Inpatient Hospital Stay (HOSPITAL_COMMUNITY): Payer: Managed Care, Other (non HMO)

## 2015-11-13 DIAGNOSIS — F1010 Alcohol abuse, uncomplicated: Secondary | ICD-10-CM

## 2015-11-13 DIAGNOSIS — F411 Generalized anxiety disorder: Secondary | ICD-10-CM

## 2015-11-13 DIAGNOSIS — I6522 Occlusion and stenosis of left carotid artery: Secondary | ICD-10-CM

## 2015-11-13 LAB — GLUCOSE, CAPILLARY
GLUCOSE-CAPILLARY: 177 mg/dL — AB (ref 65–99)
GLUCOSE-CAPILLARY: 117 mg/dL — AB (ref 65–99)
Glucose-Capillary: 132 mg/dL — ABNORMAL HIGH (ref 65–99)
Glucose-Capillary: 151 mg/dL — ABNORMAL HIGH (ref 65–99)

## 2015-11-13 LAB — COMPREHENSIVE METABOLIC PANEL
ALT: 33 U/L (ref 17–63)
AST: 22 U/L (ref 15–41)
Albumin: 3.1 g/dL — ABNORMAL LOW (ref 3.5–5.0)
Alkaline Phosphatase: 187 U/L — ABNORMAL HIGH (ref 38–126)
Anion gap: 8 (ref 5–15)
BUN: 12 mg/dL (ref 6–20)
CO2: 28 mmol/L (ref 22–32)
Calcium: 8.6 mg/dL — ABNORMAL LOW (ref 8.9–10.3)
Chloride: 100 mmol/L — ABNORMAL LOW (ref 101–111)
Creatinine, Ser: 1.31 mg/dL — ABNORMAL HIGH (ref 0.61–1.24)
GFR calc Af Amer: >60 mL/min (ref >60)
GFR calc non Af Amer: 56 mL/min — ABNORMAL LOW (ref >60)
Glucose, Bld: 131 mg/dL — ABNORMAL HIGH (ref 65–99)
Potassium: 3.9 mmol/L (ref 3.5–5.1)
Sodium: 136 mmol/L (ref 135–145)
Total Bilirubin: 0.8 mg/dL (ref 0.3–1.2)
Total Protein: 6 g/dL — ABNORMAL LOW (ref 6.5–8.1)

## 2015-11-13 LAB — BLOOD GAS, ARTERIAL
Acid-Base Excess: 0.7 mmol/L (ref 0.0–2.0)
Bicarbonate: 23.8 mEq/L (ref 20.0–24.0)
Drawn by: 406621
FIO2: 0.21
O2 Saturation: 97.1 %
Patient temperature: 98.6
TCO2: 24.8 mmol/L (ref 0–100)
pCO2 arterial: 32.2 mmHg — ABNORMAL LOW (ref 35.0–45.0)
pH, Arterial: 7.482 — ABNORMAL HIGH (ref 7.350–7.450)
pO2, Arterial: 91.6 mmHg (ref 80.0–100.0)

## 2015-11-13 LAB — CULTURE, BLOOD (ROUTINE X 2)
CULTURE: NO GROWTH
Culture: NO GROWTH

## 2015-11-13 LAB — CBC
HCT: 28.3 % — ABNORMAL LOW (ref 39.0–52.0)
Hemoglobin: 9.7 g/dL — ABNORMAL LOW (ref 13.0–17.0)
MCH: 32.7 pg (ref 26.0–34.0)
MCHC: 34.3 g/dL (ref 30.0–36.0)
MCV: 95.3 fL (ref 78.0–100.0)
Platelets: 228 10*3/uL (ref 150–400)
RBC: 2.97 MIL/uL — ABNORMAL LOW (ref 4.22–5.81)
RDW: 13.2 % (ref 11.5–15.5)
WBC: 8.6 10*3/uL (ref 4.0–10.5)

## 2015-11-13 LAB — PREPARE RBC (CROSSMATCH)

## 2015-11-13 LAB — HEPARIN LEVEL (UNFRACTIONATED): HEPARIN UNFRACTIONATED: 0.56 [IU]/mL (ref 0.30–0.70)

## 2015-11-13 LAB — ABO/RH: ABO/RH(D): A POS

## 2015-11-13 MED ORDER — TEMAZEPAM 15 MG PO CAPS: 15.0000 mg | ORAL_CAPSULE | Freq: Once | ORAL | Status: DC | PRN

## 2015-11-13 MED ORDER — DOPAMINE-DEXTROSE 3.2-5 MG/ML-% IV SOLN
0.0000 ug/kg/min | INTRAVENOUS | Status: AC
  Administered 2015-11-14: 3 ug/kg/min via INTRAVENOUS
  Filled 2015-11-13: qty 250

## 2015-11-13 MED ORDER — CHLORHEXIDINE GLUCONATE 4 % EX LIQD
60.0000 mL | Freq: Once | CUTANEOUS | Status: AC
  Administered 2015-11-14: 4 via TOPICAL
  Filled 2015-11-13 (×2): qty 60

## 2015-11-13 MED ORDER — NITROGLYCERIN IN D5W 200-5 MCG/ML-% IV SOLN
2.0000 ug/min | INTRAVENOUS | Status: AC
  Administered 2015-11-14: 5 ug/min via INTRAVENOUS
  Filled 2015-11-13: qty 250

## 2015-11-13 MED ORDER — POTASSIUM CHLORIDE 2 MEQ/ML IV SOLN
80.0000 meq | INTRAVENOUS | Status: DC
  Filled 2015-11-13: qty 40

## 2015-11-13 MED ORDER — ~~LOC~~ CARDIAC SURGERY, PATIENT & FAMILY EDUCATION
Freq: Once | Status: AC
  Administered 2015-11-13: 15:00:00
  Filled 2015-11-13: qty 1

## 2015-11-13 MED ORDER — METOPROLOL TARTRATE 12.5 MG HALF TABLET
12.5000 mg | ORAL_TABLET | Freq: Once | ORAL | Status: AC
  Administered 2015-11-14: 12.5 mg via ORAL
  Filled 2015-11-13: qty 1

## 2015-11-13 MED ORDER — BISACODYL 5 MG PO TBEC
5.0000 mg | DELAYED_RELEASE_TABLET | Freq: Once | ORAL | Status: AC
  Administered 2015-11-13: 5 mg via ORAL
  Filled 2015-11-13: qty 1

## 2015-11-13 MED ORDER — PIPERACILLIN-TAZOBACTAM 3.375 G IVPB
3.3750 g | Freq: Three times a day (TID) | INTRAVENOUS | Status: DC
  Administered 2015-11-14: 3.375 g via INTRAVENOUS
  Filled 2015-11-13 (×4): qty 50

## 2015-11-13 MED ORDER — HEPARIN SODIUM (PORCINE) 1000 UNIT/ML IJ SOLN
INTRAMUSCULAR | Status: DC
  Filled 2015-11-13: qty 30

## 2015-11-13 MED ORDER — PHENYLEPHRINE HCL 10 MG/ML IJ SOLN
30.0000 ug/min | INTRAVENOUS | Status: AC
  Administered 2015-11-14: 20 ug/min via INTRAVENOUS
  Filled 2015-11-13: qty 2

## 2015-11-13 MED ORDER — DEXMEDETOMIDINE HCL IN NACL 400 MCG/100ML IV SOLN
0.1000 ug/kg/h | INTRAVENOUS | Status: AC
  Administered 2015-11-14: .4 ug/kg/h via INTRAVENOUS
  Administered 2015-11-14: 15:00:00 via INTRAVENOUS
  Filled 2015-11-13: qty 100

## 2015-11-13 MED ORDER — CHLORHEXIDINE GLUCONATE 4 % EX LIQD
60.0000 mL | Freq: Once | CUTANEOUS | Status: AC
  Administered 2015-11-13: 4 via TOPICAL
  Filled 2015-11-13 (×2): qty 60

## 2015-11-13 MED ORDER — LEVOFLOXACIN IN D5W 500 MG/100ML IV SOLN
500.0000 mg | INTRAVENOUS | Status: AC
  Administered 2015-11-14: 500 mg via INTRAVENOUS
  Filled 2015-11-13: qty 100

## 2015-11-13 MED ORDER — DIAZEPAM 5 MG PO TABS
5.0000 mg | ORAL_TABLET | Freq: Once | ORAL | Status: AC
  Administered 2015-11-14: 5 mg via ORAL
  Filled 2015-11-13: qty 1

## 2015-11-13 MED ORDER — CHLORHEXIDINE GLUCONATE 0.12 % MT SOLN
15.0000 mL | Freq: Once | OROMUCOSAL | Status: AC
  Administered 2015-11-14: 15 mL via OROMUCOSAL
  Filled 2015-11-13: qty 15

## 2015-11-13 MED ORDER — SODIUM CHLORIDE 0.9 % IV SOLN
INTRAVENOUS | Status: AC
  Administered 2015-11-14: 1 [IU]/h via INTRAVENOUS
  Filled 2015-11-13: qty 2.5

## 2015-11-13 MED ORDER — SODIUM CHLORIDE 0.9 % IV SOLN
INTRAVENOUS | Status: AC
  Administered 2015-11-14: 69.8 mL/h via INTRAVENOUS
  Filled 2015-11-13: qty 40

## 2015-11-13 MED ORDER — PLASMA-LYTE 148 IV SOLN
INTRAVENOUS | Status: AC
  Administered 2015-11-14: 500 mL
  Filled 2015-11-13: qty 2.5

## 2015-11-13 MED ORDER — VANCOMYCIN HCL 10 G IV SOLR
1500.0000 mg | INTRAVENOUS | Status: AC
  Administered 2015-11-14: 1500 mg via INTRAVENOUS
  Filled 2015-11-13: qty 1500

## 2015-11-13 MED ORDER — IOPAMIDOL (ISOVUE-300) INJECTION 61%
INTRAVENOUS | Status: AC
  Administered 2015-11-13: 75 mL
  Filled 2015-11-13: qty 75

## 2015-11-13 MED ORDER — EPINEPHRINE HCL 1 MG/ML IJ SOLN
0.0000 ug/min | INTRAVENOUS | Status: DC
  Filled 2015-11-13: qty 4

## 2015-11-13 MED ORDER — MAGNESIUM SULFATE 50 % IJ SOLN
40.0000 meq | INTRAMUSCULAR | Status: DC
  Filled 2015-11-13: qty 10

## 2015-11-13 MED ORDER — PIPERACILLIN-TAZOBACTAM 3.375 G IVPB
3.3750 g | Freq: Three times a day (TID) | INTRAVENOUS | Status: DC
  Filled 2015-11-13: qty 50

## 2015-11-13 NOTE — Progress Notes (Signed)
Patient Name: Brian Casey Date of Encounter: 11/13/2015  Primary Cardiologist: Dr. Rockey Situ Patient Profile: 65 y.o. male with h/o DM2, HTN, HLD, hypothyroidisim, and chronic alcohol abuse presented to [facility] on 11/04/15 with RUQ abdominal and was found to have acute cholecystitis on CT scan as well as new onset Afib with RVR with heart rates in the 180's bpm with troponin elevation of 0.27-->0.04 without chest pain. He was seen by general surgery who recommended cholecystectomy after cardiac clearance. He underwent Lexiscan Myoview on 11/06/15 that was high risk with moderate sized region of moderate intensity in the lateral wall. EF 68%. Given his elevated troponin and abnormal nuclear stress test he underwent cardiac cath on 8/16 in the evening that showed severe 4-vessel CAD. Case was reviewed between Drs. Rockey Situ and Ellyn Hack who felt the patient needed transfer to [facility] for evaluation of CABG.   SUBJECTIVE: Feels well, denies chest pain and SOB.    OBJECTIVE Vitals:   11/12/15 2323 11/13/15 0237 11/13/15 0500 11/13/15 0728  BP: (!) 154/60 (!) 150/72  (!) 144/79  Pulse: 76 (!) 59  (!) 59  Resp: _0 Temp: 98.2 F (36.8 C) 98.3 F (36.8 C)  97.7 F (36.5 C)  TempSrc: Oral Oral  Oral  SpO2: 95% 95%  95%  Weight:   206 lb 5.6 oz (93.6 kg)   Height:        Intake/Output Summary (Last 24 hours) at 11/13/15 0943 Last data filed at 11/13/15 0800  Gross per 24 hour  Intake           1127.9 ml  Output             1450 ml  Net           -322.1 ml   Filed Weights   11/11/15 0500 11/12/15 0607 11/13/15 0500  Weight: 217 lb 13 oz (98.8 kg) 213 lb 3 oz (96.7 kg) 206 lb 5.6 oz (93.6 kg)    PHYSICAL EXAM General: Well developed, well nourished, male in no acute distress. Head: Normocephalic, atraumatic.  Neck: Supple without bruits, no JVD. Lungs:  Resp regular and unlabored, CTA. Heart: RRR, S1, S2, no S3, S4, or murmur; no rub. Abdomen: Soft, non-tender, non-distended, BS + x  4.  Extremities: No clubbing, cyanosis, no edema.  Neuro: Alert and oriented X 3. Moves all extremities spontaneously. Psych: Normal affect.  LABS: CBC: Recent Labs  11/12/15 0640 11/13/15 0420  WBC 9.0 8.6  HGB 9.8* 9.7*  HCT 29.2* 28.3*  MCV 97.0 95.3  PLT 221 228   INR: Recent Labs  11/12/15 0640  INR 0.51   Basic Metabolic Panel: Recent Labs  11/12/15 0640 11/13/15 0420  NA 135 136  K 4.0 3.9  CL 98* 100*  CO2 28 28  GLUCOSE 111* 131*  BUN 10 12  CREATININE 1.33* 1.31*  CALCIUM 8.5* 8.6*   Liver Function Tests: Recent Labs  11/12/15 0640 11/13/15 0420  AST 24 22  ALT 39 33  ALKPHOS 188* 187*  BILITOT 0.6 0.8  PROT 5.9* 6.0*  ALBUMIN 2.8* 3.1*   BNP:  B Natriuretic Peptide  Date/Time Value Ref Range Status  11/08/2015 08:25 PM 907.9 (H) 0.0 - 100.0 pg/mL Final     Current Facility-Administered Medications:  .  acetaminophen (TYLENOL) tablet 650 mg, 650 mg, Oral, Q4H PRN, Samella Parr, NP .  Derrill Memo ON 11/14/2015] aminocaproic acid (AMICAR) 10 g in sodium chloride 0.9 % 100 mL  infusion, , Intravenous, To OR, Ivin Poot, MD .  amiodarone (PACERONE) tablet 200 mg, 200 mg, Oral, BID, Fransisco Hertz [place], [place], 200 mg at 11/12/15 2205 .  aspirin EC tablet 81 mg, 81 mg, Oral, Daily, Fransisco Hertz [place], [place], 81 mg at 11/12/15 0813 .  atorvastatin (LIPITOR) tablet 40 mg, 40 mg, Oral, q1800, Erma Heritage, [place], 40 mg at 11/12/15 1757 .  bisacodyl (DULCOLAX) EC tablet 5 mg, 5 mg, Oral, Once, Ivin Poot, MD .  Shower Chin To Toes With 60 mL chlorhexidine (HIBICLENS) the night before surgery, , , Once **AND** [START ON 11/14/2015] Shower Chin To Toes With 60 mL chlorhexidine (HIBICLENS) in AM of surgery after pre-op clip completed, , , Once **AND** chlorhexidine (HIBICLENS) 4 % liquid 4 application, 60 mL, Topical, Once **AND** [START ON 11/14/2015] chlorhexidine (HIBICLENS) 4 % liquid 4 application, 60 mL, Topical, Once, Ivin Poot, MD .  Derrill Memo  ON 11/14/2015] chlorhexidine (PERIDEX) 0.12 % solution 15 mL, 15 mL, Mouth/Throat, Once, Ivin Poot, MD .  Derrill Memo ON 11/14/2015] dexmedetomidine (PRECEDEX) 400 MCG/100ML (4 mcg/mL) infusion, 0.1-0.7 mcg/kg/hr, Intravenous, To OR, Ivin Poot, MD .  Derrill Memo ON 11/14/2015] diazepam (VALIUM) tablet 5 mg, 5 mg, Oral, Once, Ivin Poot, MD .  Derrill Memo ON 11/14/2015] DOPamine (INTROPIN) 800 mg in dextrose 5 % 250 mL (3.2 mg/mL) infusion, 0-10 mcg/kg/min, Intravenous, To OR, Ivin Poot, MD .  Derrill Memo ON 11/14/2015] EPINEPHrine (ADRENALIN) 4 mg in dextrose 5 % 250 mL (0.016 mg/mL) infusion, 0-10 mcg/min, Intravenous, To OR, Ivin Poot, MD .  escitalopram (LEXAPRO) tablet 10 mg, 10 mg, Oral, Daily, Erma Heritage, [place], 10 mg at 11/12/15 0813 .  feeding supplement (GLUCERNA SHAKE) (GLUCERNA SHAKE) liquid 237 mL, 237 mL, Oral, TID BM, Clanford L Johnson, MD, 237 mL at 11/12/15 2206 .  folic acid (FOLVITE) tablet 1 mg, 1 mg, Oral, Daily, Lamar Sprinkles, MD, 1 mg at 11/12/15 0813 .  furosemide (LASIX) tablet 20 mg, 20 mg, Oral, Daily, Leonie Man, MD, 20 mg at 11/12/15 1439 .  [START ON 11/14/2015] heparin 2,500 Units, papaverine 30 mg in electrolyte-148 (PLASMALYTE-148) 500 mL irrigation, , Irrigation, To OR, Ivin Poot, MD .  Derrill Memo ON 11/14/2015] heparin 30,000 units/NS 1000 mL solution for CELLSAVER, , Other, To OR, Ivin Poot, MD .  heparin ADULT infusion 100 units/mL (25000 units/215m sodium chloride 0.45%), 1,800 Units/hr, Intravenous, Continuous, TTroy Sine MD, Last Rate: 18 mL/hr at 11/13/15 0700, 1,800 Units/hr at 11/13/15 0700 .  insulin aspart (novoLOG) injection 0-20 Units, 0-20 Units, Subcutaneous, TID WC, LDionne Milo NP, 3 Units at 11/13/15 0831 .  insulin aspart (novoLOG) injection 0-5 Units, 0-5 Units, Subcutaneous, QHS, ASamella Parr NP, 2 Units at 11/10/15 0029 .  insulin aspart (novoLOG) injection 3 Units, 3 Units, Subcutaneous, TID WC, Clanford LMarisa Hua MD, 3 Units at 11/12/15 1756 .  [START ON 11/14/2015] insulin regular (NOVOLIN R,HUMULIN R) 250 Units in sodium chloride 0.9 % 250 mL (1 Units/mL) infusion, , Intravenous, To OR, PIvin Poot MD .  levalbuterol ([place] nebulizer solution 1.25 mg, 1.25 mg, Nebulization, Q6H PRN, TTroy Sine MD .  levothyroxine (SYNTHROID, LEVOTHROID) tablet 100 mcg, 100 mcg, Oral, QAC breakfast, BErma Heritage [place] 100 mcg at 11/13/15 0547 .  magic mouthwash, 10 mL, Oral, TID, PIvin Poot MD, 10 mL at 11/12/15 2205 .  [START ON 11/14/2015] magnesium sulfate (IV Push/IM) injection 40 mEq, 40  mEq, Other, To OR, Ivin Poot, MD .  metoprolol (LOPRESSOR) tablet 50 mg, 50 mg, Oral, BID, Eileen Stanford, PA-C, 50 mg at 11/12/15 2205 .  [START ON 11/14/2015] metoprolol tartrate (LOPRESSOR) tablet 12.5 mg, 12.5 mg, Oral, Once, Ivin Poot, MD .  mometasone-formoterol [facility]) 200-5 MCG/ACT inhaler 2 puff, 2 puff, Inhalation, BID, Ivin Poot, MD, 2 puff at 11/13/15 0827 .  morphine 2 MG/ML injection 1-4 mg, 1-4 mg, Intravenous, Q2H PRN, Samella Parr, NP, 2 mg at 11/10/15 0950 .  nitroGLYCERIN (NITROSTAT) SL tablet 0.4 mg, 0.4 mg, Sublingual, Q5 Min x 3 PRN, Erma Heritage, [place] .  [START ON 11/14/2015] nitroGLYCERIN 50 mg in dextrose 5 % 250 mL (0.2 mg/mL) infusion, 2-200 mcg/min, Intravenous, To OR, Ivin Poot, MD .  ondansetron [facility]) injection 4 mg, 4 mg, Intravenous, Q6H PRN **OR** promethazine (PHENERGAN) injection 25 mg, 25 mg, Intravenous, Q6H PRN, Samella Parr, NP .  oxyCODONE (Oxy IR/ROXICODONE) immediate release tablet 5-10 mg, 5-10 mg, Oral, Q4H PRN, Samella Parr, NP .  pantoprazole (PROTONIX) EC tablet 40 mg, 40 mg, Oral, Daily, Erma Heritage, [place], 40 mg at 11/12/15 0813 .  [START ON 11/14/2015] phenylephrine (NEO-SYNEPHRINE) 20 mg in dextrose 5 % 250 mL (0.08 mg/mL) infusion, 30-200 mcg/min, Intravenous, To OR, Ivin Poot, MD .   piperacillin-tazobactam (ZOSYN) IVPB 3.375 g, 3.375 g, Intravenous, Q8H, Troy Sine, MD, 3.375 g at 11/13/15 0230 .  [START ON 11/14/2015] potassium chloride injection 80 mEq, 80 mEq, Other, To OR, Ivin Poot, MD .  potassium chloride SA (K-DUR,KLOR-CON) CR tablet 20 mEq, 20 mEq, Oral, BID, Fransisco Hertz [place], [place], 20 mEq at 11/12/15 2205 .  senna (SENOKOT) tablet 8.6 mg, 1 tablet, Oral, QHS PRN, Erma Heritage, PA .  sodium chloride flush (NS) 0.9 % injection 10-40 mL, 10-40 mL, Intracatheter, PRN, Troy Sine, MD .  temazepam (RESTORIL) capsule 15 mg, 15 mg, Oral, Once PRN, Ivin Poot, MD .  thiamine (VITAMIN B-1) tablet 100 mg, 100 mg, Oral, Daily, 100 mg at 11/12/15 0813 **OR** thiamine (B-1) injection 100 mg, 100 mg, Intravenous, Daily, Lamar Sprinkles, MD . heparin 1,800 Units/hr (11/13/15 0700)    TELE:   Sinus bradycardia   Cardiac cath 11/06/2015 at [facility]:  Conclusion    Dist RCA lesion, 95 %stenosed.  Ramus lesion, 90 %stenosed.  Ost Cx lesion, 95 %stenosed.  Mid LAD lesion, 70 %stenosed.  Dist LAD lesion, 60 %stenosed.  Ost LM to LM lesion, 40 %stenosed.    2-D echo 11/02/8869: Normal systolic function with EF 50-55%. Grade 2 diastolic dysfunction/pseudo-normal filling. Otherwise normal   Radiology/Studies: Dg Chest 2 View  Result Date: 11/12/2015 CLINICAL DATA:  History of gastroesophageal reflux, hypertension, diabetes, coronary artery disease, current smoker. The patient underwent cholecystostomy on November 08, 2015. EXAM: CHEST  2 VIEW COMPARISON:  PA and lateral chest x-ray of November 07, 2015 FINDINGS: There remains mild volume loss on the right. The left lung is better inflated. There is persistent left basilar density. There are small bilateral pleural effusions layering posterior. The cardiac silhouette remains enlarged. The central pulmonary vascularity is engorged. There is calcification in the wall of the aortic arch. The PICC line tip  projects over the midportion of the SVC. The bony thorax exhibits no acute abnormality. IMPRESSION: Mild CHF with bilateral pleural effusions. Left lower lobe atelectasis is suspected as well. No significant change since the earlier study. Electronically Signed  By: Askia Hazelip  [place] M.D.   On: 11/12/2015 07:48     Current Medications:  . [START ON 11/14/2015] aminocaproic acid (AMICAR) for OHS   Intravenous To OR  . amiodarone  200 mg Oral BID  . aspirin EC  81 mg Oral Daily  . atorvastatin  40 mg Oral q1800  . bisacodyl  5 mg Oral Once  . chlorhexidine  60 mL Topical Once   And  . [START ON 11/14/2015] chlorhexidine  60 mL Topical Once  . [START ON 11/14/2015] chlorhexidine  15 mL Mouth/Throat Once  . [START ON 11/14/2015] dexmedetomidine  0.1-0.7 mcg/kg/hr Intravenous To OR  . [START ON 11/14/2015] diazepam  5 mg Oral Once  . [START ON 11/14/2015] DOPamine  0-10 mcg/kg/min Intravenous To OR  . [START ON 11/14/2015] epinephrine  0-10 mcg/min Intravenous To OR  . escitalopram  10 mg Oral Daily  . feeding supplement (GLUCERNA SHAKE)  237 mL Oral TID BM  . folic acid  1 mg Oral Daily  . furosemide  20 mg Oral Daily  . [START ON 11/14/2015] heparin-papaverine-plasmalyte irrigation   Irrigation To OR  . [START ON 11/14/2015] heparin 30,000 units/NS 1000 mL solution for CELLSAVER   Other To OR  . insulin aspart  0-20 Units Subcutaneous TID WC  . insulin aspart  0-5 Units Subcutaneous QHS  . insulin aspart  3 Units Subcutaneous TID WC  . [START ON 11/14/2015] insulin (NOVOLIN-R) infusion   Intravenous To OR  . levothyroxine  100 mcg Oral QAC breakfast  . magic mouthwash  10 mL Oral TID  . [START ON 11/14/2015] magnesium sulfate  40 mEq Other To OR  . metoprolol tartrate  50 mg Oral BID  . [START ON 11/14/2015] metoprolol tartrate  12.5 mg Oral Once  . mometasone-formoterol  2 puff Inhalation BID  . [START ON 11/14/2015] nitroGLYCERIN  2-200 mcg/min Intravenous To OR  . pantoprazole  40 mg Oral Daily    . [START ON 11/14/2015] phenylephrine (NEO-SYNEPHRINE) Adult infusion  30-200 mcg/min Intravenous To OR  . piperacillin-tazobactam (ZOSYN)  IV  3.375 g Intravenous Q8H  . [START ON 11/14/2015] potassium chloride  80 mEq Other To OR  . potassium chloride  20 mEq Oral BID  . thiamine  100 mg Oral Daily   Or  . thiamine  100 mg Intravenous Daily   . heparin 1,800 Units/hr (11/13/15 0700)    ASSESSMENT AND PLAN: Principal Problem:   Sepsis due to Gram negative bacteria (HCC) Active Problems:   Hypothyroidism   Diabetes mellitus type 2, uncontrolled ([place])   Anxiety state   NICOTINE ADDICTION   HLD (hyperlipidemia)   Essential hypertension   Acute cholecystitis without calculus   PAF (paroxysmal atrial fibrillation) (HCC)   AKI (acute kidney injury) ([place])   Aortic atherosclerosis (HCC)   NSTEMI (non-ST elevated myocardial infarction) ([place])   Coronary artery disease involving native coronary artery of native heart with angina pectoris (HCC)   Carotid stenosis   CKD (chronic kidney disease), stage II   Protein-calorie malnutrition, severe (HCC)   Alcohol abuse   Left ventricular diastolic dysfunction/grade 2   Bilateral carotid artery disease ([place]) 1. Severe multivessel CAD: severe 4V CAD. Plan is for CABG tomorrow. Continue ASA, BB and statin.    2. B carotid stenosis / LEFT ICAorigin,luminal diameter of less than 1 mm, estimated 90% stenosis or greater.:Dr. Bridgett Larsson has evaluatedand plan is for combined CABG and L CEA.   3. PAF: was in afib with RVR on  admission and placed on amio gtt. He has had short bursts of RVR since. He has since converted to NSR and now on PO amiodarone 224m BID. Lopressor to 559mBID.  -CHADS2VASc at least 3 (HTN, DM, vascular disease) -Heparin gtt, [place] -Telemetry now shows brief paroxysmal atrial tachycardia. Mostly sinus rhythm. Continue beta blocker and amiodarone. -Mildly anemic. Watch with heparin.  4/ Cholecystitis:  surgery deferred at this time. Continue Zosyn.Also on vancomycin which may be able to be stopped if no evidence of gram-positive activity.PICC line for TPN. Surgery placed perc tube for drainage. Feels better. Currently tolerating clears fairly well. Appreciate hospitalist consultation with AlErin HearingNP (family also appreciated as well).   [facility]. [facility]  6. HTN: lopressor 5082mID. Relatively well-controlled. As his cholecystitis improves, I suspect his BP may continue to increase.  Would consider ACE-I/ARB post-op CABG as long as renal Fxn remains stable  7. HLD: continue Lipitor. Last lipid panel 10/2014 and LDL 88  8. DM: HgA1c 6.4, hyperglycemia likely exacerbated by parenteral nutrition. Appreciate TRH guidance.  9. Pain:Much better controlled, appreciateTRH.  Signed, EriArbutus LeasNP 9:43 AM 11/13/2015 Pager [phone] have seen, examined and evaluated the patient this pm along with EriJettie BoozeP.  After reviewing all the available data and chart, we discussed the patients laboratory, study & physical findings as well as symptoms in detail. I agree with her findings, examination as well as impression recommendations as per our discussion.     He continues to be doing fairly well with no active anginal symptoms. No recurrence of A. fib.   On stable medications with pending CABG tomorrow.  We will be available for assistance post CABG if necessary.   DavGlenetta Hew.D., M.S. Interventional Cardiologist   Pager # [phone] # [phone] [email or web address] [id number]

## 2015-11-13 NOTE — Progress Notes (Signed)
ANTICOAGULATION CONSULT NOTE - Follow Up Consult  Pharmacy Consult for Heparin Indication: atrial fibrillation and multivessel CAD  Allergies  Allergen Reactions  . Bupropion Hcl     REACTION: Severe constipation, insomnia  . Penicillins Other (See Comments)    REACTION: Vomiting Has patient had a PCN reaction causing immediate rash, facial/tongue/throat swelling, SOB or lightheadedness with hypotension: Yes Has patient had a PCN reaction causing severe rash involving mucus membranes or skin necrosis: No Has patient had a PCN reaction that required hospitalization No Has patient had a PCN reaction occurring within the last 10 years: No If all of the above answers are "NO", then may proceed with Cephalosporin use.    Patient Measurements: Height: _0  (170.2 cm) Weight: 206 lb 5.6 oz (93.6 kg) IBW/kg (Calculated) : 66.1 Heparin Dosing Weight: 87 kg  Vital Signs: Temp: 97.7 F (36.5 C) (08/23 0728) Temp Source: Oral (08/23 0728) BP: 144/79 (08/23 0728) Pulse Rate: 59 (08/23 0728)  Labs:  Recent Labs  11/11/15 0505 11/11/15 1445 11/12/15 0640 11/13/15 0400 11/13/15 0420  HGB 9.6*  --  9.8*  --  9.7*  HCT 29.0*  --  29.2*  --  28.3*  PLT 220  --  221  --  228  LABPROT  --   --  15.1  --   --   INR  --   --  1.19  --   --   HEPARINUNFRC 0.93* 0.68 0.54 0.56  --   CREATININE 1.16  --  1.33*  --  1.31*    Estimated Creatinine Clearance: 62.9 mL/min (by C-G formula based on SCr of 1.31 mg/dL).  Assessment: 65 y.o.malewith new onset afib + multivessel CAD, awaiting CABG/carotid planned for Thursday 8/24.  Heparin level remains therapeutic (0.56) on 1800 units/hr. CBC stable, no bleeding reported.  Goal of Therapy:  Heparin level 0.3-0.7 units/ml Monitor platelets by anticoagulation protocol: Yes   Plan:   Continue heparin drip at 1800 units/h  Daily heparin level and CBC  For CABG/carotid on 8/24, transition to [place] post-op per Cards  Elicia Lamp, PharmD,  BCPS Clinical Pharmacist Pager [phone] 11/13/2015 8:44 AM

## 2015-11-13 NOTE — Progress Notes (Signed)
  Progress Note    11/13/2015 10:39 AM * No surgery date entered *  Subjective:  Feeling ok today  Vitals:   11/13/15 0237 11/13/15 0728  BP: (!) 150/72 (!) 144/79  Pulse: (!) 59 (!) 59  Resp: 19 19  Temp: 98.3 F (36.8 C) 97.7 F (36.5 C)    Physical Exam: Neuro: cn in tact, upper and lower extremity strength 5/5 Cv: rrr Resp: non labored Ext: no edema Abd: chole tube in place and draining  CBC    Component Value Date/Time   WBC 8.6 11/13/2015 0420   RBC 2.97 (L) 11/13/2015 0420   HGB 9.7 (L) 11/13/2015 0420   HCT 28.3 (L) 11/13/2015 0420   PLT 228 11/13/2015 0420   MCV 95.3 11/13/2015 0420   MCH 32.7 11/13/2015 0420   MCHC 34.3 11/13/2015 0420   RDW 13.2 11/13/2015 0420   LYMPHSABS 0.4 (L) 11/09/2015 0450   MONOABS 0.7 11/09/2015 0450   EOSABS 0.1 11/09/2015 0450   BASOSABS 0.0 11/09/2015 0450    BMET    Component Value Date/Time   NA 136 11/13/2015 0420   K 3.9 11/13/2015 0420   CL 100 (L) 11/13/2015 0420   CO2 28 11/13/2015 0420   GLUCOSE 131 (H) 11/13/2015 0420   BUN 12 11/13/2015 0420   CREATININE 1.31 (H) 11/13/2015 0420   CALCIUM 8.6 (L) 11/13/2015 0420   GFRNONAA 56 (L) 11/13/2015 0420   GFRAA >60 11/13/2015 0420    INR    Component Value Date/Time   INR 1.19 11/12/2015 0640     Intake/Output Summary (Last 24 hours) at 11/13/15 1039 Last data filed at 11/13/15 0900  Gross per 24 hour  Intake           1367.9 ml  Output             2000 ml  Net           -632.1 ml     Assessment:  65 y.o. male with coronary and carotid disease, L ICA with 90% stenosis  Plan: OR tomorrow for Left CEA in conjunction with cabg Discussed risks of bleeding, infection, stroke and nerve injury with patient as he is willing to proceed.    Brandon C. Donzetta Matters, MD [facility] [employer] [facility] Office: [phone] Pager: [phone]  11/13/2015 10:39 AM

## 2015-11-13 NOTE — Progress Notes (Signed)
PROGRESS NOTE        PATIENT DETAILS Name: Brian Casey Age: 65 y.o. Sex: male Date of Birth: [birth year] Admit Date: 11/07/2015 Admitting Physician Troy Sine, MD K[facility] Kary Kos, MD  Brief Narrative: Patient is a 64 y.o. male with past medical history of hypothyroidism, diabetes, alcohol abuse who was admitted to [facility] with sepsis secondary to acute cholecystitis. During his hospitalization, he was found to have new onset atrial fibrillation, acute kidney injury and elevated troponins. A subsequent nuclear stress test was positive for ischemia, patient underwent cardiac catheterization which revealed multivessel CAD, patient was transferred to the cardiology service here at [facility] to be evaluated by cardiothoracic surgery for possible CABG. The hospitalist service was consulted for to manage his multiple medical issues.  Subjective: Denies any abdominal pain.  Assessment/Plan: Principal Problem: Sepsis due to acute cholecystitis: Sepsis pathophysiology has resolved, patient was deemed to be a very poor surgical candidate, as a result underwent percutaneous cholecystostomy tube placement on 8/18. He has been on intravenous Zosyn since 8/14-suspect he requires a 2 week course of antibiotics-stop date of antibiotics 8/28. Cholecystostomy tube is in place, remove at the discretion of general surgery or interventional radiology over the next few weeks  Type 2 diabetes: CBGs stable, continue SSI-A1c at 6.4-could potentially place diet control/exercise regimen on discharge.  Paroxysmal atrial fibrillation: On amiodarone and Lopressor for rate control, on heparin infusion-with plans to transition to DOAC's post CABG.  Non-STEMI: LHC revealed multivessel CAD-cardia thoracic surgery planning on CABG on 8/24-management deferred to cardiology  Left carotid stenosis: Scheduled for a left CEA in conjunction with CABG on  8/24.  Anxiety/depression: Continue Lexapro.  Hypothyroidism: Continue levothyroxine  History of alcohol abuse: Awake and alert-non-tremulous-he is out of the window for alcohol withdrawal at this time.  Severe Protein calorie malnutrition: Suggest nutrition/dietitian consult-post CABG   Hospitalist service will sign off, please reconsult as needed.  DVT Prophylaxis: Full dose anticoagulation with Heparin  Code Status: Full code   Family Communication: None at bedside  Disposition Plan: Remain inpatient  Antimicrobial agents: IV Zosyn 8/14>>  Time spent: 25 minutes-Greater than 50% of this time was spent in counseling, explanation of diagnosis, planning of further management, and coordination of care.  MEDICATIONS: Anti-infectives    Start     Dose/Rate Route Frequency Ordered Stop   11/14/15 0530  piperacillin-tazobactam (ZOSYN) IVPB 3.375 g     3.375 g 12.5 mL/hr over 240 Minutes Intravenous Every 8 hours 11/13/15 1308     11/14/15 0400  vancomycin (VANCOCIN) 1,500 mg in sodium chloride 0.9 % 250 mL IVPB     1,500 mg 125 mL/hr over 120 Minutes Intravenous To Surgery 11/13/15 1425 11/15/15 0400   11/13/15 1830  piperacillin-tazobactam (ZOSYN) IVPB 3.375 g  Status:  Discontinued     3.375 g 12.5 mL/hr over 240 Minutes Intravenous Every 8 hours 11/13/15 1308 11/13/15 1309   11/10/15 1400  vancomycin (VANCOCIN) 1,250 mg in sodium chloride 0.9 % 250 mL IVPB  Status:  Discontinued     1,250 mg 166.7 mL/hr over 90 Minutes Intravenous Every 12 hours 11/10/15 1131 11/10/15 1447   11/09/15 1400  vancomycin (VANCOCIN) 1,250 mg in sodium chloride 0.9 % 250 mL IVPB  Status:  Discontinued     1,250 mg 166.7 mL/hr over 90 Minutes Intravenous Every 12 hours 11/09/15  1323 11/10/15 1131   11/08/15 0230  piperacillin-tazobactam (ZOSYN) IVPB 3.375 g  Status:  Discontinued     3.375 g 12.5 mL/hr over 240 Minutes Intravenous Every 8 hours 11/07/15 2205 11/13/15 1308   11/07/15 1630   piperacillin-tazobactam (ZOSYN) IVPB 3.375 g  Status:  Discontinued     3.375 g 12.5 mL/hr over 240 Minutes Intravenous Every 8 hours 11/07/15 1553 11/07/15 2205      Scheduled Meds: . [START ON 11/14/2015] aminocaproic acid (AMICAR) for OHS   Intravenous To OR  . amiodarone  200 mg Oral BID  . aspirin EC  81 mg Oral Daily  . atorvastatin  40 mg Oral q1800  . chlorhexidine  60 mL Topical Once   And  . [START ON 11/14/2015] chlorhexidine  60 mL Topical Once  . [START ON 11/14/2015] chlorhexidine  15 mL Mouth/Throat Once  . [START ON 11/14/2015] dexmedetomidine  0.1-0.7 mcg/kg/hr Intravenous To OR  . [START ON 11/14/2015] diazepam  5 mg Oral Once  . [START ON 11/14/2015] DOPamine  0-10 mcg/kg/min Intravenous To OR  . [START ON 11/14/2015] epinephrine  0-10 mcg/min Intravenous To OR  . escitalopram  10 mg Oral Daily  . feeding supplement (GLUCERNA SHAKE)  237 mL Oral TID BM  . folic acid  1 mg Oral Daily  . furosemide  20 mg Oral Daily  . going for heart surgery book   Does not apply Once  . [START ON 11/14/2015] heparin-papaverine-plasmalyte irrigation   Irrigation To OR  . [START ON 11/14/2015] heparin 30,000 units/NS 1000 mL solution for CELLSAVER   Other To OR  . insulin aspart  0-20 Units Subcutaneous TID WC  . insulin aspart  0-5 Units Subcutaneous QHS  . insulin aspart  3 Units Subcutaneous TID WC  . [START ON 11/14/2015] insulin (NOVOLIN-R) infusion   Intravenous To OR  . levothyroxine  100 mcg Oral QAC breakfast  . magic mouthwash  10 mL Oral TID  . [START ON 11/14/2015] magnesium sulfate  40 mEq Other To OR  . metoprolol tartrate  50 mg Oral BID  . [START ON 11/14/2015] metoprolol tartrate  12.5 mg Oral Once  . mometasone-formoterol  2 puff Inhalation BID  . [START ON 11/14/2015] nitroGLYCERIN  2-200 mcg/min Intravenous To OR  . pantoprazole  40 mg Oral Daily  . [START ON 11/14/2015] phenylephrine (NEO-SYNEPHRINE) Adult infusion  30-200 mcg/min Intravenous To OR  . [START ON  11/14/2015] piperacillin-tazobactam (ZOSYN)  IV  3.375 g Intravenous Q8H  . [START ON 11/14/2015] potassium chloride  80 mEq Other To OR  . potassium chloride  20 mEq Oral BID  . thiamine  100 mg Oral Daily   Or  . thiamine  100 mg Intravenous Daily  . [START ON 11/14/2015] vancomycin  1,500 mg Intravenous To OR   Continuous Infusions: . heparin 1,800 Units/hr (11/13/15 0700)   PRN Meds:.acetaminophen, levalbuterol, morphine injection, nitroGLYCERIN, ondansetron (ZOFRAN) IV **OR** promethazine, oxyCODONE, senna, sodium chloride flush, temazepam   PHYSICAL EXAM: Vital signs: Vitals:   11/13/15 0728 11/13/15 0820 11/13/15 1104 11/13/15 1300  BP: (!) 144/79  (!) 166/83 (!) 159/78  Pulse: (!) 59  69 64  Resp: _0 Temp: 97.7 F (36.5 C)   98.5 F (36.9 C)  TempSrc: Oral     SpO2: 95% 96% 95% 98%  Weight:      Height:       Filed Weights   11/11/15 0500 11/12/15 0607 11/13/15 0500  Weight: 98.8  kg (217 lb 13 oz) 96.7 kg (213 lb 3 oz) 93.6 kg (206 lb 5.6 oz)   Body mass index is 32.32 kg/m.   Gen Exam: Awake and alert with clear speech. Not in any distress  Neck: Supple, No JVD.   Chest: B/L Clear.   CVS: S1 S2 Regular, no murmurs.  Abdomen: soft, BS +, non tender, non distended.  Extremities: no edema, lower extremities warm to touch. Neurologic: Non Focal.   Skin: No Rash or lesions   Wounds: N/A.    I have personally reviewed following labs and imaging studies  LABORATORY DATA: CBC:  Recent Labs Lab 11/08/15 0347  11/09/15 0450 11/10/15 0630 11/11/15 0505 11/12/15 0640 11/13/15 0420  WBC 13.0*  < > 15.3* 10.7* 9.6 9.0 8.6  NEUTROABS 11.4*  --  14.1*  --   --   --   --   HGB 10.6*  < > 10.1* 9.1* 9.6* 9.8* 9.7*  HCT 30.6*  < > 29.2* 27.1* 29.0* 29.2* 28.3*  MCV 94.2  < > 97.3 95.8 96.7 97.0 95.3  PLT 176  < > 174 189 220 221 228  < > = values in this interval not displayed.  Basic Metabolic Panel:  Recent Labs Lab 11/08/15 0347  11/09/15 0450  11/10/15 0630 11/11/15 0505 11/12/15 0640 11/13/15 0420  NA 129*  < > 127* 131* 135 135 136  K 3.6  < > 4.8 3.8 4.2 4.0 3.9  CL 99*  < > 100* 97* 102 98* 100*  CO2 21*  < > 20* _0 GLUCOSE 110*  < > 493* 235* 120* 111* 131*  BUN 11  < > _1 CREATININE 1.29*  < > 1.15 1.23 1.16 1.33* 1.31*  CALCIUM 7.9*  < > 7.1* 7.9* 8.0* 8.5* 8.6*  MG 1.8  --  1.8  --   --   --   --   PHOS  --   --  4.2  --   --   --   --   < > = values in this interval not displayed.  GFR: Estimated Creatinine Clearance: 62.9 mL/min (by C-G formula based on SCr of 1.31 mg/dL).  Liver Function Tests:  Recent Labs Lab 11/09/15 0450 11/10/15 0630 11/11/15 0505 11/12/15 0640 11/13/15 0420  AST 78* 42* 36 24 22  ALT 61 54 55 39 33  ALKPHOS 137* 148* 210* 188* 187*  BILITOT 0.9 0.8 1.0 0.6 0.8  PROT 4.8* 5.0* 5.3* 5.9* 6.0*  ALBUMIN 1.9* 1.8* 1.9* 2.8* 3.1*    Recent Labs Lab 11/09/15 0450  LIPASE 23   No results for input(s): AMMONIA in the last 168 hours.  Coagulation Profile:  Recent Labs Lab 11/08/15 0347 11/12/15 0640  INR 1.15 1.19    Cardiac Enzymes: No results for input(s): CKTOTAL, CKMB, CKMBINDEX, TROPONINI in the last 168 hours.  BNP (last 3 results) No results for input(s): PROBNP in the last 8760 hours.  HbA1C: No results for input(s): HGBA1C in the last 72 hours.  CBG:  Recent Labs Lab 11/12/15 1118 11/12/15 1653 11/12/15 2222 11/13/15 0740 11/13/15 1232  GLUCAP 163* 188* 140* 132* 177*    Lipid Profile: No results for input(s): CHOL, HDL, LDLCALC, TRIG, CHOLHDL, LDLDIRECT in the last 72 hours.  Thyroid Function Tests: No results for input(s): TSH, T4TOTAL, FREET4, T3FREE, THYROIDAB in the last 72 hours.  Anemia Panel: No results for input(s): VITAMINB12, FOLATE, FERRITIN, TIBC, IRON, RETICCTPCT in  the last 72 hours.  Urine analysis:    Component Value Date/Time   COLORURINE YELLOW 11/08/2015 1959   APPEARANCEUR CLEAR 11/08/2015 1959     LABSPEC 1.019 11/08/2015 1959   PHURINE 6.5 11/08/2015 1959   GLUCOSEU 100 (A) 11/08/2015 1959   HGBUR NEGATIVE 11/08/2015 1959   BILIRUBINUR NEGATIVE 11/08/2015 1959   KETONESUR 15 (A) 11/08/2015 1959   PROTEINUR NEGATIVE 11/08/2015 1959   NITRITE NEGATIVE 11/08/2015 1959   LEUKOCYTESUR NEGATIVE 11/08/2015 1959    Sepsis Labs: Lactic Acid, Venous    Component Value Date/Time   LATICACIDVEN 1.1 11/09/2015 0450    MICROBIOLOGY: Recent Results (from the past 240 hour(s))  Culture, body fluid-bottle     Status: Abnormal   Collection Time: 11/08/15  6:02 PM  Result Value Ref Range Status   Specimen Description BILE  Final   Special Requests BOTTLES DRAWN AEROBIC AND ANAEROBIC 5CC  Final   Gram Stain   Final    GRAM NEGATIVE RODS IN BOTH AEROBIC AND ANAEROBIC BOTTLES CRITICAL RESULT CALLED TO, READ BACK BY AND VERIFIED WITH: T KEETON,RN _0  11/09/15 MKELLY    Culture ESCHERICHIA COLI (A)  Final   Report Status 11/12/2015 FINAL  Final   Organism ID, Bacteria ESCHERICHIA COLI  Final      Susceptibility   Escherichia coli - MIC*    AMPICILLIN >=32 RESISTANT Resistant     CEFAZOLIN <=4 SENSITIVE Sensitive     CEFEPIME <=1 SENSITIVE Sensitive     CEFTAZIDIME <=1 SENSITIVE Sensitive     CEFTRIAXONE <=1 SENSITIVE Sensitive     CIPROFLOXACIN <=0.25 SENSITIVE Sensitive     GENTAMICIN <=1 SENSITIVE Sensitive     IMIPENEM <=0.25 SENSITIVE Sensitive     TRIMETH/SULFA <=20 SENSITIVE Sensitive     AMPICILLIN/SULBACTAM 16 INTERMEDIATE Intermediate     PIP/TAZO <=4 SENSITIVE Sensitive     Extended ESBL NEGATIVE Sensitive     * ESCHERICHIA COLI  Gram stain     Status: None   Collection Time: 11/08/15  6:02 PM  Result Value Ref Range Status   Specimen Description BILE  Final   Special Requests NONE  Final   Gram Stain   Final    ABUNDANT WBC PRESENT,BOTH PMN AND MONONUCLEAR ABUNDANT GRAM NEGATIVE RODS    Report Status 11/08/2015 FINAL  Final  Surgical pcr screen     Status:  None   Collection Time: 11/08/15  8:00 PM  Result Value Ref Range Status   MRSA, PCR NEGATIVE NEGATIVE Final   Staphylococcus aureus NEGATIVE NEGATIVE Final    Comment:        The Xpert SA Assay (FDA approved for NASAL specimens in patients over 17 years of age), is one component of a comprehensive surveillance program.  Test performance has been validated by [facility] for patients greater than or equal to 61 year old. It is not intended to diagnose infection nor to guide or monitor treatment.   Culture, blood (routine x 2)     Status: None   Collection Time: 11/08/15  9:19 PM  Result Value Ref Range Status   Specimen Description BLOOD LEFT ANTECUBITAL  Final   Special Requests BOTTLES DRAWN AEROBIC AND ANAEROBIC 5CC  Final   Culture NO GROWTH 5 DAYS  Final   Report Status 11/13/2015 FINAL  Final  Culture, blood (routine x 2)     Status: None   Collection Time: 11/08/15  9:29 PM  Result Value Ref Range Status   Specimen Description BLOOD RIGHT  HAND  Final   Special Requests BOTTLES DRAWN AEROBIC AND ANAEROBIC 5CC  Final   Culture NO GROWTH 5 DAYS  Final   Report Status 11/13/2015 FINAL  Final    RADIOLOGY STUDIES/RESULTS: Ct Abdomen Pelvis Wo Contrast  Result Date: 11/04/2015 CLINICAL DATA:  Right lower quadrant pain extending into the right upper and central abdomen. Symptoms for 2 days. History of hernia repair. EXAM: CT ABDOMEN AND PELVIS WITHOUT CONTRAST TECHNIQUE: Multidetector CT imaging of the abdomen and pelvis was performed following the standard protocol without IV contrast. COMPARISON:  None. FINDINGS: Lower chest: Mild dependent airspace opacities at both lung bases, probably atelectasis. No significant pleural or pericardial effusion. Prominent coronary artery atherosclerosis noted. There is a small hiatal hernia. Hepatobiliary: The hepatic density is diffusely decreased, consistent with steatosis. No focal hepatic abnormalities are identified on noncontrast  imaging. The gallbladder is distended with wall thickening and prominent surrounding inflammatory changes. No calcified gallstones are seen. There is no significant biliary dilatation. Pancreas: Unremarkable. No pancreatic ductal dilatation or surrounding inflammatory changes. Spleen: Normal in size without focal abnormality. Adrenals/Urinary Tract: Both adrenal glands appear normal. Both kidneys demonstrate mild cortical thinning and symmetric perinephric soft tissue stranding. 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 No evidence of renal mass, hydronephrosis or urinary tract calculus. The bladder appears unremarkable. Stomach/Bowel: No evidence bowel wall thickening or distention. The right upper quadrant inflammatory changes do not appear to arise from the duodenum. The appendix appears normal. There is mild distal colonic diverticulosis. Vascular/Lymphatic: There are no enlarged abdominal or pelvic lymph nodes. Aortic and branch vessel atherosclerosis noted. Vascular assessment limited without contrast. Reproductive: There is a focal parenchymal calcification within the right aspect of the prostate gland which is not significantly enlarged. The seminal vesicles appear normal. Other: No evidence of abdominal wall mass or hernia. Musculoskeletal: No acute or significant osseous findings. IMPRESSION: 1. Distended gallbladder with wall thickening and surrounding inflammation highly suspicious for acute cholecystitis. Correlate clinically. No calcified gallstones or biliary dilatation identified. 2. No other acute findings are seen.  The appendix appears normal. 3. Bibasilar atelectasis, hepatic steatosis and mild distal colonic diverticulosis. 4.  Aortic Atherosclerosis (ICD10-170.0) Electronically Signed   By: Richardean Sale M.D.   On: 11/04/2015 10:17   Ct Angio Head W Or Wo Contrast  Result Date: 11/09/2015 CLINICAL DATA:  Noninvasive testing discovered BILATERAL carotid  stenosis. Coronary artery disease. No neurologic symptoms. EXAM: CT ANGIOGRAPHY HEAD AND NECK TECHNIQUE: Multidetector CT imaging of the head and neck was performed using the standard protocol during bolus administration of intravenous contrast. Multiplanar CT image reconstructions and MIPs were obtained to evaluate the vascular anatomy. Carotid stenosis measurements (when applicable) are obtained utilizing NASCET criteria, using the distal internal carotid diameter as the denominator. CONTRAST:  50 mL Isovue 370. COMPARISON:  None. FINDINGS: CT HEAD Calvarium and skull base: No fracture or destructive lesion. Mastoids and middle ears are grossly clear. Paranasal sinuses: Layering fluid RIGHT greater than LEFT maxillary sinuses. Orbits: Negative. Brain: No evidence of acute abnormality, including acute infarct, hemorrhage, hydrocephalus, or mass lesion. Mild atrophy, slightly premature for age. Slight hypoattenuation of white matter representing chronic microvascular ischemic change. CTA NECK Aortic arch: Standard branching. Imaged portion shows no evidence of aneurysm or dissection. No significant stenosis of the major arch vessel origins. Right carotid system: Moderate calcific and noncalcified plaque proximal RIGHT common carotid artery, non stenotic. Irregular calcified and noncalcified plaque at the bifurcation, RIGHT ICA origin,  60-70% stenosis based on luminal measurements 1.9/5.6 proximal/distal. No evidence of dissection, or occlusion. Left carotid system: Non stenotic atheromatous change in the proximal LEFT common carotid artery. Irregular calcific and noncalcified plaque at the LEFT ICA origin, 90% or greater stenosis, based on luminal measurements of 0.6/4.1 proximal/distal. Some apparent reduction in luminal caliber of the cervical LEFT ICA could underestimate the degree of stenosis. No evidence of dissection, or occlusion. Vertebral arteries: Codominant. Calcified plaque at the LEFT vertebral origin  estimated 50% stenosis. No evidence of dissection, or occlusion. Nonvascular soft tissues: BILATERAL pleural effusions. BILATERAL airspace opacities, correlate clinically for pneumonia versus edema, greater in the LEFT upper lobe. No neck masses. Airway midline. Central venous catheter without pneumothorax. Cervical spondylosis. In CTA HEAD Anterior circulation: Calcific plaque in the cavernous and supraclinoid RIGHT ICA segments, non stenotic. 50% tandem stenoses due to calcific plaque in the inferior and superior cavernous segments of the LEFT ICA. BILATERAL ICA supraclinoid and termini segments widely patent. No M1 stenosis or occlusion. Both anterior cerebral arteries unremarkable. No significant MCA branch stenosis or occlusion. No visible aneurysm or vascular malformation. Posterior circulation: Both vertebral arteries contribute to formation of basilar which is widely patent. No cerebellar branch occlusion or proximal PCA disease. No aneurysm, or vascular malformation. Venous sinuses: As permitted by contrast timing, patent. Anatomic variants: None of significance. Delayed phase:   No abnormal intracranial enhancement. IMPRESSION: Significant calcific and noncalcified plaque at the LEFT ICA origin, luminal diameter of less than 1 mm, estimated 90% stenosis or greater. Similar less severe calcific and noncalcified plaque, RIGHT ICA origin, estimated 60-70% stenosis. Tandem 50% stenoses cavernous LEFT ICA, but no other intracranial atheromatous change of significance. No posterior circulation disease of significance. BILATERAL pleural effusions, and BILATERAL LEFT greater than RIGHT airspace opacities of uncertain significance. Correlate clinically for pneumonia versus edema. Electronically Signed   By: Staci Righter M.D.   On: 11/09/2015 11:30   Dg Chest 2 View  Result Date: 11/12/2015 CLINICAL DATA:  History of gastroesophageal reflux, hypertension, diabetes, coronary artery disease, current smoker. The  patient underwent cholecystostomy on November 08, 2015. EXAM: CHEST  2 VIEW COMPARISON:  PA and lateral chest x-ray of November 07, 2015 FINDINGS: There remains mild volume loss on the right. The left lung is better inflated. There is persistent left basilar density. There are small bilateral pleural effusions layering posterior. The cardiac silhouette remains enlarged. The central pulmonary vascularity is engorged. There is calcification in the wall of the aortic arch. The PICC line tip projects over the midportion of the SVC. The bony thorax exhibits no acute abnormality. IMPRESSION: Mild CHF with bilateral pleural effusions. Left lower lobe atelectasis is suspected as well. No significant change since the earlier study. Electronically Signed   By: David  Martinique M.D.   On: 11/12/2015 07:48   Dg Chest 2 View  Result Date: 11/08/2015 CLINICAL DATA:  Shortness of breath. Abdominal pain. Gallbladder and heart complications. EXAM: CHEST  2 VIEW COMPARISON:  11/10/2013 FINDINGS: Mild cardiac enlargement with central pulmonary vascular congestion. Mild interstitial pattern to the lungs likely represents early interstitial edema. Changes are new since prior study. Small bilateral pleural effusions. No pneumothorax. Calcified aorta. IMPRESSION: Cardiac enlargement with mild central pulmonary vascular congestion, interstitial edema, and small pleural effusions. Electronically Signed   By: Lucienne Capers M.D.   On: 11/08/2015 01:46   Ct Angio Neck W Or Wo Contrast  Result Date: 11/09/2015 CLINICAL DATA:  Noninvasive testing discovered BILATERAL carotid stenosis. Coronary artery disease.  No neurologic symptoms. EXAM: CT ANGIOGRAPHY HEAD AND NECK TECHNIQUE: Multidetector CT imaging of the head and neck was performed using the standard protocol during bolus administration of intravenous contrast. Multiplanar CT image reconstructions and MIPs were obtained to evaluate the vascular anatomy. Carotid stenosis measurements  (when applicable) are obtained utilizing NASCET criteria, using the distal internal carotid diameter as the denominator. CONTRAST:  50 mL Isovue 370. COMPARISON:  None. FINDINGS: CT HEAD Calvarium and skull base: No fracture or destructive lesion. Mastoids and middle ears are grossly clear. Paranasal sinuses: Layering fluid RIGHT greater than LEFT maxillary sinuses. Orbits: Negative. Brain: No evidence of acute abnormality, including acute infarct, hemorrhage, hydrocephalus, or mass lesion. Mild atrophy, slightly premature for age. Slight hypoattenuation of white matter representing chronic microvascular ischemic change. CTA NECK Aortic arch: Standard branching. Imaged portion shows no evidence of aneurysm or dissection. No significant stenosis of the major arch vessel origins. Right carotid system: Moderate calcific and noncalcified plaque proximal RIGHT common carotid artery, non stenotic. Irregular calcified and noncalcified plaque at the bifurcation, RIGHT ICA origin, 60-70% stenosis based on luminal measurements 1.9/5.6 proximal/distal. No evidence of dissection, or occlusion. Left carotid system: Non stenotic atheromatous change in the proximal LEFT common carotid artery. Irregular calcific and noncalcified plaque at the LEFT ICA origin, 90% or greater stenosis, based on luminal measurements of 0.6/4.1 proximal/distal. Some apparent reduction in luminal caliber of the cervical LEFT ICA could underestimate the degree of stenosis. No evidence of dissection, or occlusion. Vertebral arteries: Codominant. Calcified plaque at the LEFT vertebral origin estimated 50% stenosis. No evidence of dissection, or occlusion. Nonvascular soft tissues: BILATERAL pleural effusions. BILATERAL airspace opacities, correlate clinically for pneumonia versus edema, greater in the LEFT upper lobe. No neck masses. Airway midline. Central venous catheter without pneumothorax. Cervical spondylosis. In CTA HEAD Anterior circulation:  Calcific plaque in the cavernous and supraclinoid RIGHT ICA segments, non stenotic. 50% tandem stenoses due to calcific plaque in the inferior and superior cavernous segments of the LEFT ICA. BILATERAL ICA supraclinoid and termini segments widely patent. No M1 stenosis or occlusion. Both anterior cerebral arteries unremarkable. No significant MCA branch stenosis or occlusion. No visible aneurysm or vascular malformation. Posterior circulation: Both vertebral arteries contribute to formation of basilar which is widely patent. No cerebellar branch occlusion or proximal PCA disease. No aneurysm, or vascular malformation. Venous sinuses: As permitted by contrast timing, patent. Anatomic variants: None of significance. Delayed phase:   No abnormal intracranial enhancement. IMPRESSION: Significant calcific and noncalcified plaque at the LEFT ICA origin, luminal diameter of less than 1 mm, estimated 90% stenosis or greater. Similar less severe calcific and noncalcified plaque, RIGHT ICA origin, estimated 60-70% stenosis. Tandem 50% stenoses cavernous LEFT ICA, but no other intracranial atheromatous change of significance. No posterior circulation disease of significance. BILATERAL pleural effusions, and BILATERAL LEFT greater than RIGHT airspace opacities of uncertain significance. Correlate clinically for pneumonia versus edema. Electronically Signed   By: Staci Righter M.D.   On: 11/09/2015 11:30   Ct Chest W Contrast  Result Date: 11/13/2015 CLINICAL DATA:  Shortness of breath.  COPD. EXAM: CT CHEST WITH CONTRAST TECHNIQUE: Multidetector CT imaging of the chest was performed during intravenous contrast administration. CONTRAST:  53m ISOVUE-300 IOPAMIDOL (ISOVUE-300) INJECTION 61% COMPARISON:  Chest radiograph on 11/12/2015 FINDINGS: Cardiovascular: Heart size within normal limits. Three-vessel coronary artery calcification. Aortic atherosclerosis. Right subclavian central venous catheter tip in distal SVC.  Mediastinum/Nodes: No masses, pathologically enlarged lymph nodes, or other significant abnormality. Lungs/Pleura: Small pleural effusions  seen bilaterally with mild dependent atelectasis. Patchy areas of airspace disease are seen in both upper lobes, most likely due to infectious or inflammatory process. No evidence of pulmonary mass. Central tracheobronchial airways are patent. Upper Abdomen: Diffuse hepatic steatosis and minimal perihepatic ascites. Musculoskeletal: No chest wall mass or suspicious bone lesions identified. IMPRESSION: Patchy bilateral upper lobe airspace disease, most likely infectious or inflammatory in etiology. Small bilateral pleural effusions and bibasilar atelectasis. No evidence of mass or lymphadenopathy. Hepatic steatosis and minimal perihepatic ascites. Aortic atherosclerosis and three-vessel coronary artery calcification. Electronically Signed   By: Earle Gell M.D.   On: 11/13/2015 13:34   US Renal  Result Date: 11/04/2015 CLINICAL DATA:  Acute renal failure. EXAM: RENAL / URINARY TRACT ULTRASOUND COMPLETE COMPARISON:  None. FINDINGS: Right Kidney: Length: 12.4 cm. Echogenicity within normal limits. No mass or hydronephrosis visualized. Left Kidney: Length: 11.1 cm. Echogenicity within normal limits. No mass or hydronephrosis visualized. Bladder: Appears normal for degree of bladder distention. IMPRESSION: Negative. Electronically Signed   By: Staci Righter M.D.   On: 11/04/2015 17:31   Nm Myocar Multi W/spect W/wall Motion / Ef  Result Date: 11/06/2015 Pharmacological myocardial perfusion imaging study with moderate sized region of moderate intensity ischemia in the lateral wall Normal wall motion, EF estimated at 68% No EKG changes concerning for ischemia at peak stress or in recovery. High risk scan given lateral wall ischemia, given recent troponin elevation Signed, Esmond Plants, MD, Ph.D [facility]   Ir Perc Cholecystostomy  Result Date: 11/08/2015 INDICATION:  65 year old male with acute acalculous cholecystitis EXAM: IR IMAGE GUIDED DRAINAGE BY PERCUTANEOUS CATHETER MEDICATIONS: The patient is currently admitted to the hospital and receiving intravenous antibiotics. The antibiotics were administered within an appropriate time frame prior to the initiation of the procedure. ANESTHESIA/SEDATION: Fentanyl 50 mcg IV; Versed 1.0 mg IV, 4 mg Zofran Moderate Sedation Time:  35 minutes The patient was continuously monitored during the procedure by the interventional radiology nurse under my direct supervision. COMPLICATIONS: None PROCEDURE: Informed written consent was obtained from the patient and the patient's family after a thorough discussion of the procedural risks, benefits and alternatives. All questions were addressed. Maximal Sterile Barrier Technique was utilized including caps, mask, sterile gowns, sterile gloves, sterile drape, hand hygiene and skin antiseptic. A timeout was performed prior to the initiation of the procedure. Ultrasound survey of the right upper quadrant was performed for planning purposes. Once the patient is prepped and draped in the usual sterile fashion, the skin and subcutaneous tissues overlying the gallbladder were generously infiltrated 1% lidocaine for local anesthesia. Using ultrasound guidance, attempt was made to guide Chiba needle through liver parenchyma into the gallbladder. The arrangement of the gallbladder in the right upper quadrant precluded a liver passage, given the overlying ribs. Access was through the dome of the gallbladder. With removal of the stylet, spontaneous dark bile drainage occurred. Using modified Seldinger technique, a 10 French drain was placed into the gallbladder fossa, with aspiration of the sample for the lab. Contrast injection confirmed position of the tube within the gallbladder lumen. Drainage catheter was attached to gravity drain with a suture retention placed. Patient tolerated the procedure well and  remained hemodynamically stable throughout. No complications were encountered and no significant blood loss encountered. IMPRESSION: Status post percutaneous cholecystostomy. Sample of of bile was sent to the lab for analysis. Signed, Dulcy Fanny. Earleen Newport, DO [facility] Electronically Signed   By: Corrie Mckusick D.O.  On: 11/08/2015 18:06   Dg Abd Portable 1v  Result Date: 11/08/2015 CLINICAL DATA:  No bowel sounds EXAM: PORTABLE ABDOMEN - 1 VIEW COMPARISON:  11/08/2015 FINDINGS: There is normal small bowel gas pattern. A cholecystostomy catheter is noted in right lower quadrant. Moderate gas noted within transverse colon. IMPRESSION: Normal small bowel gas pattern. Moderate gas noted in transverse colon. Cholecystostomy catheter is noted in right upper quadrant. Electronically Signed   By: Lahoma Crocker M.D.   On: 11/08/2015 23:07   US Abdomen Limited Ruq  Result Date: 11/04/2015 CLINICAL DATA:  Right-sided abdominal pain for 2 days.  Abnormal CT. EXAM: US ABDOMEN LIMITED - RIGHT UPPER QUADRANT COMPARISON:  CT 11/04/2015. FINDINGS: Gallbladder: Gallbladder wall thickening to 7 mm is noted at the gallbladder fundus. Gallbladder sludge is present. No discrete gallstones are seen. Sonographic Percell Miller sign is absent according to the sonographer. Common bile duct: Diameter: 5 mm. Liver: Increased echogenicity corresponding with steatosis on CT. No focal hepatic abnormalities identified. IMPRESSION: 1. Gallbladder wall thickening and sludge without gallstones, sonographic Murphy sign or biliary dilatation. 2. I reviewed the CT from earlier today, and the inflammatory changes on that study are in the right upper quadrant with the epicenter at the gallbladder. In conjunction with that study, findings still remain concerning for acalculous cholecystitis. The patient's appendix does lie in the right upper quadrant adjacent to the gallbladder, but appears normal.  There is no apparent wall thickening of the duodenum or colon. The pancreas appears normal, and the patient does not have an elevated lipase level, although does have leukocytosis. Electronically Signed   By: Richardean Sale M.D.   On: 11/04/2015 11:35     LOS: 6 days   Oren Binet, MD  [facility] Pager:[phone]  If 7PM-7AM, please contact night-coverage [email or web address] Password [facility] 11/13/2015, 3:58 PM

## 2015-11-13 NOTE — Progress Notes (Addendum)
Procedure(s) (LRB): CORONARY ARTERY BYPASS GRAFTING (CABG) (N/A) TRANSESOPHAGEAL ECHOCARDIOGRAM (TEE) (N/A) LEFT ENDARTERECTOMY CAROTID (Left) Subjective: Patient remains without abdominal complaint, afebrile, white count normal Gallbladder drain continues with daily output of 100 cc of bile Bile cultures have returned positive for Escherichia coli sensitive to Zosyn LFTs remain satisfactory, albumin now increased to 3.1 Continue IV heparin until on-call to OR tomorrow Combined CABG with left carotid endarterectomy scheduled for a.m. August 24 Continue IV Zosyn postop for acalculous cholecystitis   Objective: Vital signs in last 24 hours: Temp:  [97.6 F (36.4 C)-98.3 F (36.8 C)] 98.3 F (36.8 C) (08/23 0237) Pulse Rate:  [58-76] 59 (08/23 0237) Cardiac Rhythm: Sinus bradycardia (08/23 0237) Resp:  [14-19] 19 (08/23 0237) BP: (148-157)/(60-72) 150/72 (08/23 0237) SpO2:  [95 %-97 %] 95 % (08/23 0237) Weight:  [206 lb 5.6 oz (93.6 kg)] 206 lb 5.6 oz (93.6 kg) (08/23 0500)  Hemodynamic parameters for last 24 hours:  stable  Intake/Output from previous day: 08/22 0701 - 08/23 0700 In: 1091.9 [P.O.:480; I.V.:456.9; IV Piggyback:150] Out: 1450 [Urine:1350; Drains:100] Intake/Output this shift: No intake/output data recorded.       Exam    General- alert and comfortable   Lungs- clear without rales, wheezes   Cor- regular rate and rhythm, no murmur , gallop   Abdomen- soft, non-tender-gallbladder drain in place   Extremities - warm, non-tender, minimal edema   Neuro- oriented, appropriate, no focal weakness   Lab Results:  Recent Labs  11/12/15 0640 11/13/15 0420  WBC 9.0 8.6  HGB 9.8* 9.7*  HCT 29.2* 28.3*  PLT 221 228   BMET:  Recent Labs  11/12/15 0640 11/13/15 0420  NA 135 136  K 4.0 3.9  CL 98* 100*  CO2 28 28  GLUCOSE 111* 131*  BUN 10 12  CREATININE 1.33* 1.31*  CALCIUM 8.5* 8.6*    PT/INR:  Recent Labs  11/12/15 0640  LABPROT 15.1  INR  1.19   ABG No results found for: PHART, HCO3, TCO2, ACIDBASEDEF, O2SAT CBG (last 3)   Recent Labs  11/12/15 1653 11/12/15 2222 11/13/15 0740  GLUCAP 188* 140* 132*    Assessment/Plan: S/P Procedure(s) (LRB): CORONARY ARTERY BYPASS GRAFTING (CABG) (N/A) TRANSESOPHAGEAL ECHOCARDIOGRAM (TEE) (N/A) LEFT ENDARTERECTOMY CAROTID (Left) CABG x4 in am-procedure indications benefits and risks fully discussed with patient  Patient's PFTs demonstrate severe COPD FVC 30% predicted FEV1 25% predicted Diffusion capacity 32% predicted  This places patient at increased risk for postoperative palmar he complications, prolonged intubation, possible tracheostomy. We will obtain preoperative CT scan of chest to better assess pulmonary parenchymal disease.   LOS: 6 days    Tharon Aquas Trigt III 11/13/2015

## 2015-11-13 NOTE — Progress Notes (Signed)
CARDIAC REHAB PHASE I   PRE:  Rate/Rhythm: 71 SR    BP: sitting 164/73    SaO2: 95 RA  MODE:  Ambulation: 900 ft   POST:  Rate/Rhythm: 89 SR    BP: sitting 166/83     SaO2: 98 RA  Pt is stronger and more aware. Able to walk 900 ft with quick pace and RW. Used assist x2 for safety and equipment. Did not require O2 today and SaO2 98 RA. Denied SOB. Return to recliner. Pt practiced IS x10. Reviewed ed. Son present. Pt feels good. [id number]   [place], ACSM 11/13/2015 11:07 AM

## 2015-11-14 ENCOUNTER — Inpatient Hospital Stay (HOSPITAL_COMMUNITY): Payer: Managed Care, Other (non HMO)

## 2015-11-14 ENCOUNTER — Inpatient Hospital Stay (HOSPITAL_COMMUNITY): Payer: Managed Care, Other (non HMO) | Admitting: Certified Registered Nurse Anesthetist

## 2015-11-14 ENCOUNTER — Inpatient Hospital Stay (HOSPITAL_COMMUNITY)
Admission: AD | Disposition: A | Discharge status: Home / Self Care | Payer: Self-pay | Source: Other Acute Inpatient Hospital | Attending: Cardiovascular Disease

## 2015-11-14 DIAGNOSIS — Z951 Presence of aortocoronary bypass graft: Secondary | ICD-10-CM

## 2015-11-14 HISTORY — PX: VEIN HARVEST: SHX6363

## 2015-11-14 HISTORY — PX: ENDARTERECTOMY: SHX5162

## 2015-11-14 HISTORY — PX: CORONARY ARTERY BYPASS GRAFT: SHX141

## 2015-11-14 HISTORY — PX: TEE WITHOUT CARDIOVERSION: SHX5443

## 2015-11-14 LAB — POCT I-STAT, CHEM 8
BUN: 12 mg/dL (ref 6–20)
BUN: 10 mg/dL (ref 6–20)
BUN: 12 mg/dL (ref 6–20)
BUN: 13 mg/dL (ref 6–20)
BUN: 11 mg/dL (ref 6–20)
BUN: 11 mg/dL (ref 6–20)
BUN: 12 mg/dL (ref 6–20)
BUN: 15 mg/dL (ref 6–20)
CALCIUM ION: 0.97 mmol/L — AB (ref 1.12–1.23)
CALCIUM ION: 1.19 mmol/L (ref 1.12–1.23)
CALCIUM ION: 1.2 mmol/L (ref 1.12–1.23)
CALCIUM ION: 0.95 mmol/L — AB (ref 1.12–1.23)
CALCIUM ION: 0.96 mmol/L — AB (ref 1.12–1.23)
CALCIUM ION: 1.16 mmol/L (ref 1.12–1.23)
CHLORIDE: 100 mmol/L — AB (ref 101–111)
CHLORIDE: 100 mmol/L — AB (ref 101–111)
CHLORIDE: 100 mmol/L — AB (ref 101–111)
CHLORIDE: 95 mmol/L — AB (ref 101–111)
CHLORIDE: 97 mmol/L — AB (ref 101–111)
CHLORIDE: 102 mmol/L (ref 101–111)
CREATININE: 1.1 mg/dL (ref 0.61–1.24)
CREATININE: 1.2 mg/dL (ref 0.61–1.24)
CREATININE: 1.2 mg/dL (ref 0.61–1.24)
Calcium, Ion: 1.21 mmol/L (ref 1.12–1.23)
Calcium, Ion: 1.18 mmol/L (ref 1.12–1.23)
Chloride: 98 mmol/L — ABNORMAL LOW (ref 101–111)
Chloride: 100 mmol/L — ABNORMAL LOW (ref 101–111)
Creatinine, Ser: 1 mg/dL (ref 0.61–1.24)
Creatinine, Ser: 1.2 mg/dL (ref 0.61–1.24)
Creatinine, Ser: 1.1 mg/dL (ref 0.61–1.24)
Creatinine, Ser: 1.2 mg/dL (ref 0.61–1.24)
Creatinine, Ser: 1.2 mg/dL (ref 0.61–1.24)
GLUCOSE: 124 mg/dL — AB (ref 65–99)
GLUCOSE: 141 mg/dL — AB (ref 65–99)
GLUCOSE: 142 mg/dL — AB (ref 65–99)
GLUCOSE: 142 mg/dL — AB (ref 65–99)
GLUCOSE: 112 mg/dL — AB (ref 65–99)
Glucose, Bld: 125 mg/dL — ABNORMAL HIGH (ref 65–99)
Glucose, Bld: 142 mg/dL — ABNORMAL HIGH (ref 65–99)
Glucose, Bld: 169 mg/dL — ABNORMAL HIGH (ref 65–99)
HCT: 20 % — ABNORMAL LOW (ref 39.0–52.0)
HCT: 28 % — ABNORMAL LOW (ref 39.0–52.0)
HCT: 30 % — ABNORMAL LOW (ref 39.0–52.0)
HCT: 20 % — ABNORMAL LOW (ref 39.0–52.0)
HCT: 24 % — ABNORMAL LOW (ref 39.0–52.0)
HEMATOCRIT: 28 % — AB (ref 39.0–52.0)
HEMATOCRIT: 21 % — AB (ref 39.0–52.0)
HEMATOCRIT: 29 % — AB (ref 39.0–52.0)
HEMOGLOBIN: 6.8 g/dL — AB (ref 13.0–17.0)
HEMOGLOBIN: 7.1 g/dL — AB (ref 13.0–17.0)
HEMOGLOBIN: 9.9 g/dL — AB (ref 13.0–17.0)
Hemoglobin: 9.5 g/dL — ABNORMAL LOW (ref 13.0–17.0)
Hemoglobin: 10.2 g/dL — ABNORMAL LOW (ref 13.0–17.0)
Hemoglobin: 6.8 g/dL — CL (ref 13.0–17.0)
Hemoglobin: 9.5 g/dL — ABNORMAL LOW (ref 13.0–17.0)
Hemoglobin: 8.2 g/dL — ABNORMAL LOW (ref 13.0–17.0)
POTASSIUM: 4.3 mmol/L (ref 3.5–5.1)
POTASSIUM: 5 mmol/L (ref 3.5–5.1)
POTASSIUM: 5.3 mmol/L — AB (ref 3.5–5.1)
POTASSIUM: 4.8 mmol/L (ref 3.5–5.1)
Potassium: 4.5 mmol/L (ref 3.5–5.1)
Potassium: 5 mmol/L (ref 3.5–5.1)
Potassium: 5.9 mmol/L — ABNORMAL HIGH (ref 3.5–5.1)
Potassium: 5.9 mmol/L — ABNORMAL HIGH (ref 3.5–5.1)
SODIUM: 134 mmol/L — AB (ref 135–145)
SODIUM: 133 mmol/L — AB (ref 135–145)
SODIUM: 134 mmol/L — AB (ref 135–145)
SODIUM: 135 mmol/L (ref 135–145)
SODIUM: 138 mmol/L (ref 135–145)
Sodium: 137 mmol/L (ref 135–145)
Sodium: 136 mmol/L (ref 135–145)
Sodium: 136 mmol/L (ref 135–145)
TCO2: 28 mmol/L (ref 0–100)
TCO2: 25 mmol/L (ref 0–100)
TCO2: 26 mmol/L (ref 0–100)
TCO2: 26 mmol/L (ref 0–100)
TCO2: 24 mmol/L (ref 0–100)
TCO2: 24 mmol/L (ref 0–100)
TCO2: 22 mmol/L (ref 0–100)
TCO2: 23 mmol/L (ref 0–100)

## 2015-11-14 LAB — CBC
HCT: 31 % — ABNORMAL LOW (ref 39.0–52.0)
HCT: 25.2 % — ABNORMAL LOW (ref 39.0–52.0)
HEMATOCRIT: 26.4 % — AB (ref 39.0–52.0)
Hemoglobin: 10.4 g/dL — ABNORMAL LOW (ref 13.0–17.0)
Hemoglobin: 9.1 g/dL — ABNORMAL LOW (ref 13.0–17.0)
Hemoglobin: 8.5 g/dL — ABNORMAL LOW (ref 13.0–17.0)
MCH: 32.1 pg (ref 26.0–34.0)
MCH: 31.8 pg (ref 26.0–34.0)
MCH: 31 pg (ref 26.0–34.0)
MCHC: 33.5 g/dL (ref 30.0–36.0)
MCHC: 34.5 g/dL (ref 30.0–36.0)
MCHC: 33.7 g/dL (ref 30.0–36.0)
MCV: 95.7 fL (ref 78.0–100.0)
MCV: 92.3 fL (ref 78.0–100.0)
MCV: 92 fL (ref 78.0–100.0)
PLATELETS: 111 10*3/uL — AB (ref 150–400)
Platelets: 252 10*3/uL (ref 150–400)
Platelets: 117 10*3/uL — ABNORMAL LOW (ref 150–400)
RBC: 3.24 MIL/uL — ABNORMAL LOW (ref 4.22–5.81)
RBC: 2.86 MIL/uL — ABNORMAL LOW (ref 4.22–5.81)
RBC: 2.74 MIL/uL — ABNORMAL LOW (ref 4.22–5.81)
RDW: 13.2 % (ref 11.5–15.5)
RDW: 14.8 % (ref 11.5–15.5)
RDW: 15.3 % (ref 11.5–15.5)
WBC: 8.3 10*3/uL (ref 4.0–10.5)
WBC: 11.7 10*3/uL — AB (ref 4.0–10.5)
WBC: 11.7 10*3/uL — ABNORMAL HIGH (ref 4.0–10.5)

## 2015-11-14 LAB — APTT: APTT: 38 s — AB (ref 24–36)

## 2015-11-14 LAB — MAGNESIUM: Magnesium: 2.8 mg/dL — ABNORMAL HIGH (ref 1.7–2.4)

## 2015-11-14 LAB — PROTIME-INR
INR: 1.81
PROTHROMBIN TIME: 21.2 s — AB (ref 11.4–15.2)

## 2015-11-14 LAB — POCT I-STAT 3, ART BLOOD GAS (G3+)
ACID-BASE DEFICIT: 2 mmol/L (ref 0.0–2.0)
ACID-BASE DEFICIT: 3 mmol/L — AB (ref 0.0–2.0)
Acid-Base Excess: 1 mmol/L (ref 0.0–2.0)
BICARBONATE: 21.9 meq/L (ref 20.0–24.0)
BICARBONATE: 26.3 meq/L — AB (ref 20.0–24.0)
Bicarbonate: 23 mEq/L (ref 20.0–24.0)
Bicarbonate: 25.7 mEq/L — ABNORMAL HIGH (ref 20.0–24.0)
O2 SAT: 100 %
O2 SAT: 100 %
O2 SAT: 97 %
O2 Saturation: 99 %
PCO2 ART: 39.5 mmHg (ref 35.0–45.0)
PCO2 ART: 38.2 mmHg (ref 35.0–45.0)
PH ART: 7.361 (ref 7.350–7.450)
PH ART: 7.366 (ref 7.350–7.450)
PH ART: 7.421 (ref 7.350–7.450)
PO2 ART: 130 mmHg — AB (ref 80.0–100.0)
Patient temperature: 36.8
Patient temperature: 97.6
TCO2: 23 mmol/L (ref 0–100)
TCO2: 24 mmol/L (ref 0–100)
TCO2: 27 mmol/L (ref 0–100)
TCO2: 28 mmol/L (ref 0–100)
pCO2 arterial: 40.3 mmHg (ref 35.0–45.0)
pCO2 arterial: 52.3 mmHg — ABNORMAL HIGH (ref 35.0–45.0)
pH, Arterial: 7.308 — ABNORMAL LOW (ref 7.350–7.450)
pO2, Arterial: 347 mmHg — ABNORMAL HIGH (ref 80.0–100.0)
pO2, Arterial: 363 mmHg — ABNORMAL HIGH (ref 80.0–100.0)
pO2, Arterial: 91 mmHg (ref 80.0–100.0)

## 2015-11-14 LAB — BASIC METABOLIC PANEL
Anion gap: 6 (ref 5–15)
Anion gap: 6 (ref 5–15)
BUN: 12 mg/dL (ref 6–20)
BUN: 13 mg/dL (ref 6–20)
CO2: 25 mmol/L (ref 22–32)
CO2: 23 mmol/L (ref 22–32)
Calcium: 8.7 mg/dL — ABNORMAL LOW (ref 8.9–10.3)
Calcium: 7.6 mg/dL — ABNORMAL LOW (ref 8.9–10.3)
Chloride: 102 mmol/L (ref 101–111)
Chloride: 105 mmol/L (ref 101–111)
Creatinine, Ser: 1.25 mg/dL — ABNORMAL HIGH (ref 0.61–1.24)
Creatinine, Ser: 1.43 mg/dL — ABNORMAL HIGH (ref 0.61–1.24)
GFR calc Af Amer: >60 mL/min (ref >60)
GFR calc Af Amer: 59 mL/min — ABNORMAL LOW (ref >60)
GFR calc non Af Amer: 60 mL/min — ABNORMAL LOW (ref >60)
GFR calc non Af Amer: 51 mL/min — ABNORMAL LOW (ref >60)
Glucose, Bld: 126 mg/dL — ABNORMAL HIGH (ref 65–99)
Glucose, Bld: 112 mg/dL — ABNORMAL HIGH (ref 65–99)
Potassium: 3.9 mmol/L (ref 3.5–5.1)
Potassium: 4.7 mmol/L (ref 3.5–5.1)
Sodium: 133 mmol/L — ABNORMAL LOW (ref 135–145)
Sodium: 134 mmol/L — ABNORMAL LOW (ref 135–145)

## 2015-11-14 LAB — GLUCOSE, CAPILLARY
GLUCOSE-CAPILLARY: 89 mg/dL (ref 65–99)
GLUCOSE-CAPILLARY: 96 mg/dL (ref 65–99)
GLUCOSE-CAPILLARY: 105 mg/dL — AB (ref 65–99)
GLUCOSE-CAPILLARY: 113 mg/dL — AB (ref 65–99)
GLUCOSE-CAPILLARY: 122 mg/dL — AB (ref 65–99)
Glucose-Capillary: 102 mg/dL — ABNORMAL HIGH (ref 65–99)
Glucose-Capillary: 112 mg/dL — ABNORMAL HIGH (ref 65–99)

## 2015-11-14 LAB — HEMOGLOBIN A1C
Hgb A1c MFr Bld: 7.1 % — ABNORMAL HIGH (ref 4.8–5.6)
Mean Plasma Glucose: 157 mg/dL

## 2015-11-14 LAB — HEMOGLOBIN AND HEMATOCRIT, BLOOD
HCT: 20.1 % — ABNORMAL LOW (ref 39.0–52.0)
Hemoglobin: 6.8 g/dL — CL (ref 13.0–17.0)

## 2015-11-14 LAB — PLATELET COUNT: Platelets: 120 10*3/uL — ABNORMAL LOW (ref 150–400)

## 2015-11-14 LAB — PREPARE RBC (CROSSMATCH)

## 2015-11-14 SURGERY — CORONARY ARTERY BYPASS GRAFTING (CABG)
Anesthesia: General | Site: Neck | Laterality: Right

## 2015-11-14 MED ORDER — LIDOCAINE 2% (20 MG/ML) 5 ML SYRINGE
INTRAMUSCULAR | Status: AC
  Filled 2015-11-14: qty 5

## 2015-11-14 MED ORDER — PROTAMINE SULFATE 10 MG/ML IV SOLN
INTRAVENOUS | Status: AC
  Filled 2015-11-14: qty 25

## 2015-11-14 MED ORDER — HEPARIN SODIUM (PORCINE) 1000 UNIT/ML IJ SOLN
INTRAMUSCULAR | Status: DC | PRN
  Administered 2015-11-14: 3000 [IU] via INTRAVENOUS
  Administered 2015-11-14: 22000 [IU] via INTRAVENOUS
  Administered 2015-11-14: 10000 [IU] via INTRAVENOUS

## 2015-11-14 MED ORDER — AMIODARONE HCL 200 MG PO TABS
200.0000 mg | ORAL_TABLET | Freq: Two times a day (BID) | ORAL | Status: DC
  Administered 2015-11-15 – 2015-11-16 (×3): 200 mg via ORAL
  Filled 2015-11-14 (×3): qty 1

## 2015-11-14 MED ORDER — METOPROLOL TARTRATE 5 MG/5ML IV SOLN
2.5000 mg | INTRAVENOUS | Status: DC | PRN
  Administered 2015-11-15: 5 mg via INTRAVENOUS
  Filled 2015-11-14: qty 5

## 2015-11-14 MED ORDER — SODIUM CHLORIDE 0.9% FLUSH
3.0000 mL | Freq: Two times a day (BID) | INTRAVENOUS | Status: DC
  Administered 2015-11-15 – 2015-11-17 (×5): 3 mL via INTRAVENOUS

## 2015-11-14 MED ORDER — METOPROLOL TARTRATE 12.5 MG HALF TABLET
12.5000 mg | ORAL_TABLET | Freq: Two times a day (BID) | ORAL | Status: DC
  Administered 2015-11-15 – 2015-11-17 (×4): 12.5 mg via ORAL
  Filled 2015-11-14 (×4): qty 1

## 2015-11-14 MED ORDER — CHLORHEXIDINE GLUCONATE 0.12 % MT SOLN
15.0000 mL | OROMUCOSAL | Status: AC
  Administered 2015-11-14: 15 mL via OROMUCOSAL

## 2015-11-14 MED ORDER — ACETAMINOPHEN 160 MG/5ML PO SOLN
1000.0000 mg | Freq: Four times a day (QID) | ORAL | Status: DC
  Administered 2015-11-15: 1000 mg
  Filled 2015-11-14: qty 40.6

## 2015-11-14 MED ORDER — PIPERACILLIN-TAZOBACTAM 3.375 G IVPB
3.3750 g | Freq: Three times a day (TID) | INTRAVENOUS | Status: DC
  Administered 2015-11-14 – 2015-11-20 (×18): 3.375 g via INTRAVENOUS
  Filled 2015-11-14 (×20): qty 50

## 2015-11-14 MED ORDER — ACETAMINOPHEN 500 MG PO TABS
1000.0000 mg | ORAL_TABLET | Freq: Four times a day (QID) | ORAL | Status: DC
  Administered 2015-11-15 – 2015-11-17 (×11): 1000 mg via ORAL
  Filled 2015-11-14 (×11): qty 2

## 2015-11-14 MED ORDER — DOCUSATE SODIUM 100 MG PO CAPS
200.0000 mg | ORAL_CAPSULE | Freq: Every day | ORAL | Status: DC
  Administered 2015-11-15 – 2015-11-17 (×3): 200 mg via ORAL
  Filled 2015-11-14 (×3): qty 2

## 2015-11-14 MED ORDER — PROTAMINE SULFATE 10 MG/ML IV SOLN
INTRAVENOUS | Status: AC
  Filled 2015-11-14: qty 5

## 2015-11-14 MED ORDER — FAMOTIDINE IN NACL 20-0.9 MG/50ML-% IV SOLN
20.0000 mg | Freq: Two times a day (BID) | INTRAVENOUS | Status: DC
  Administered 2015-11-14: 20 mg via INTRAVENOUS
  Filled 2015-11-14: qty 50

## 2015-11-14 MED ORDER — ALBUMIN HUMAN 5 % IV SOLN
INTRAVENOUS | Status: DC | PRN
  Administered 2015-11-14 (×2): via INTRAVENOUS

## 2015-11-14 MED ORDER — 0.9 % SODIUM CHLORIDE (POUR BTL) OPTIME
TOPICAL | Status: DC | PRN
  Administered 2015-11-14: 6000 mL
  Administered 2015-11-14: 1000 mL

## 2015-11-14 MED ORDER — VECURONIUM BROMIDE 10 MG IV SOLR
INTRAVENOUS | Status: AC
  Filled 2015-11-14: qty 10

## 2015-11-14 MED ORDER — LACTATED RINGERS IV SOLN: INTRAVENOUS | Status: DC

## 2015-11-14 MED ORDER — MORPHINE SULFATE (PF) 2 MG/ML IV SOLN
2.0000 mg | INTRAVENOUS | Status: DC | PRN
  Administered 2015-11-15: 2 mg via INTRAVENOUS
  Filled 2015-11-14: qty 1

## 2015-11-14 MED ORDER — POTASSIUM CHLORIDE 10 MEQ/50ML IV SOLN: 10.0000 meq | INTRAVENOUS | Status: AC

## 2015-11-14 MED ORDER — FENTANYL CITRATE (PF) 250 MCG/5ML IJ SOLN
INTRAMUSCULAR | Status: DC | PRN
  Administered 2015-11-14: 250 ug via INTRAVENOUS
  Administered 2015-11-14 (×2): 50 ug via INTRAVENOUS
  Administered 2015-11-14 (×2): 250 ug via INTRAVENOUS
  Administered 2015-11-14: 400 ug via INTRAVENOUS
  Administered 2015-11-14: 250 ug via INTRAVENOUS

## 2015-11-14 MED ORDER — TRAMADOL HCL 50 MG PO TABS: 50.0000 mg | ORAL_TABLET | ORAL | Status: DC | PRN

## 2015-11-14 MED ORDER — SODIUM CHLORIDE 0.9 % IV SOLN: INTRAVENOUS | Status: DC

## 2015-11-14 MED ORDER — CHLORHEXIDINE GLUCONATE 0.12% ORAL RINSE (MEDLINE KIT)
15.0000 mL | Freq: Two times a day (BID) | OROMUCOSAL | Status: DC
  Administered 2015-11-14: 15 mL via OROMUCOSAL

## 2015-11-14 MED ORDER — MIDAZOLAM HCL 2 MG/2ML IJ SOLN: 2.0000 mg | INTRAMUSCULAR | Status: DC | PRN

## 2015-11-14 MED ORDER — THROMBIN 5000 UNITS EX SOLR
OROMUCOSAL | Status: DC | PRN
  Administered 2015-11-14: 5 mL via TOPICAL

## 2015-11-14 MED ORDER — EPHEDRINE 5 MG/ML INJ
INTRAVENOUS | Status: AC
  Filled 2015-11-14: qty 10

## 2015-11-14 MED ORDER — OXYCODONE HCL 5 MG PO TABS
5.0000 mg | ORAL_TABLET | ORAL | Status: DC | PRN
  Administered 2015-11-15 – 2015-11-16 (×2): 10 mg via ORAL
  Filled 2015-11-14 (×2): qty 2

## 2015-11-14 MED ORDER — PROPOFOL 10 MG/ML IV BOLUS
INTRAVENOUS | Status: AC
  Filled 2015-11-14: qty 20

## 2015-11-14 MED ORDER — PROPOFOL 10 MG/ML IV BOLUS
INTRAVENOUS | Status: DC | PRN
  Administered 2015-11-14: 120 mg via INTRAVENOUS

## 2015-11-14 MED ORDER — ARTIFICIAL TEARS OP OINT
TOPICAL_OINTMENT | OPHTHALMIC | Status: AC
  Filled 2015-11-14: qty 3.5

## 2015-11-14 MED ORDER — LEVALBUTEROL HCL 1.25 MG/0.5ML IN NEBU
1.2500 mg | INHALATION_SOLUTION | Freq: Four times a day (QID) | RESPIRATORY_TRACT | Status: DC
  Administered 2015-11-15 (×2): 1.25 mg via RESPIRATORY_TRACT
  Filled 2015-11-14 (×2): qty 0.5

## 2015-11-14 MED ORDER — ACETAMINOPHEN 650 MG RE SUPP
650.0000 mg | Freq: Once | RECTAL | Status: AC
  Administered 2015-11-14: 650 mg via RECTAL

## 2015-11-14 MED ORDER — LACTATED RINGERS IV SOLN
INTRAVENOUS | Status: DC
  Administered 2015-11-14 – 2015-11-16 (×2): via INTRAVENOUS

## 2015-11-14 MED ORDER — DOPAMINE-DEXTROSE 3.2-5 MG/ML-% IV SOLN
0.0000 ug/kg/min | INTRAVENOUS | Status: DC
Start: 2015-11-14 — End: 2015-11-14

## 2015-11-14 MED ORDER — PROTAMINE SULFATE 10 MG/ML IV SOLN
INTRAVENOUS | Status: DC | PRN
  Administered 2015-11-14: 320 mg via INTRAVENOUS

## 2015-11-14 MED ORDER — MAGNESIUM SULFATE 4 GM/100ML IV SOLN
4.0000 g | Freq: Once | INTRAVENOUS | Status: AC
  Administered 2015-11-14: 4 g via INTRAVENOUS
  Filled 2015-11-14: qty 100

## 2015-11-14 MED ORDER — BISACODYL 10 MG RE SUPP: 10.0000 mg | Freq: Every day | RECTAL | Status: DC

## 2015-11-14 MED ORDER — MOMETASONE FURO-FORMOTEROL FUM 200-5 MCG/ACT IN AERO: 2.0000 | INHALATION_SPRAY | Freq: Two times a day (BID) | RESPIRATORY_TRACT | Status: DC

## 2015-11-14 MED ORDER — MILRINONE LACTATE IN DEXTROSE 20-5 MG/100ML-% IV SOLN
0.3750 ug/kg/min | INTRAVENOUS | Status: AC
  Administered 2015-11-14: 0.25 ug/kg/min via INTRAVENOUS
  Filled 2015-11-14: qty 100

## 2015-11-14 MED ORDER — MORPHINE SULFATE (PF) 2 MG/ML IV SOLN
1.0000 mg | INTRAVENOUS | Status: DC | PRN
  Administered 2015-11-14: 2 mg via INTRAVENOUS
  Filled 2015-11-14: qty 1

## 2015-11-14 MED ORDER — EPHEDRINE SULFATE 50 MG/ML IJ SOLN
INTRAMUSCULAR | Status: DC | PRN
  Administered 2015-11-14: 10 mg via INTRAVENOUS

## 2015-11-14 MED ORDER — PANTOPRAZOLE SODIUM 40 MG PO TBEC
40.0000 mg | DELAYED_RELEASE_TABLET | Freq: Every day | ORAL | Status: DC
  Administered 2015-11-16 – 2015-11-17 (×2): 40 mg via ORAL
  Filled 2015-11-14 (×2): qty 1

## 2015-11-14 MED ORDER — SODIUM CHLORIDE 0.45 % IV SOLN
INTRAVENOUS | Status: DC | PRN
  Administered 2015-11-14: 16:00:00 via INTRAVENOUS

## 2015-11-14 MED ORDER — ONDANSETRON HCL 4 MG/2ML IJ SOLN: 4.0000 mg | Freq: Four times a day (QID) | INTRAMUSCULAR | Status: DC | PRN

## 2015-11-14 MED ORDER — LEVALBUTEROL HCL 1.25 MG/0.5ML IN NEBU
1.2500 mg | INHALATION_SOLUTION | Freq: Four times a day (QID) | RESPIRATORY_TRACT | Status: DC
  Administered 2015-11-14: 1.25 mg via RESPIRATORY_TRACT
  Filled 2015-11-14: qty 0.5

## 2015-11-14 MED ORDER — ROCURONIUM BROMIDE 10 MG/ML (PF) SYRINGE
PREFILLED_SYRINGE | INTRAVENOUS | Status: AC
  Filled 2015-11-14: qty 10

## 2015-11-14 MED ORDER — VANCOMYCIN HCL IN DEXTROSE 1-5 GM/200ML-% IV SOLN
1000.0000 mg | INTRAVENOUS | Status: AC
  Administered 2015-11-15 – 2015-11-17 (×3): 1000 mg via INTRAVENOUS
  Filled 2015-11-14 (×3): qty 200

## 2015-11-14 MED ORDER — PHENYLEPHRINE 40 MCG/ML (10ML) SYRINGE FOR IV PUSH (FOR BLOOD PRESSURE SUPPORT)
PREFILLED_SYRINGE | INTRAVENOUS | Status: AC
  Filled 2015-11-14: qty 10

## 2015-11-14 MED ORDER — PHENYLEPHRINE HCL 10 MG/ML IJ SOLN
0.0000 ug/min | INTRAVENOUS | Status: DC
  Administered 2015-11-15: 25 ug/min via INTRAVENOUS
  Filled 2015-11-14: qty 2

## 2015-11-14 MED ORDER — ASPIRIN EC 325 MG PO TBEC
325.0000 mg | DELAYED_RELEASE_TABLET | Freq: Every day | ORAL | Status: DC
  Administered 2015-11-15 – 2015-11-17 (×3): 325 mg via ORAL
  Filled 2015-11-14 (×3): qty 1

## 2015-11-14 MED ORDER — MILRINONE LACTATE IN DEXTROSE 20-5 MG/100ML-% IV SOLN
0.2500 ug/kg/min | INTRAVENOUS | Status: DC
Start: 2015-11-14 — End: 2015-11-14

## 2015-11-14 MED ORDER — MIDAZOLAM HCL 5 MG/5ML IJ SOLN
INTRAMUSCULAR | Status: DC | PRN
  Administered 2015-11-14 (×3): 2 mg via INTRAVENOUS
  Administered 2015-11-14: 1 mg via INTRAVENOUS
  Administered 2015-11-14: 3 mg via INTRAVENOUS

## 2015-11-14 MED ORDER — SODIUM CHLORIDE 0.9 % IV SOLN
INTRAVENOUS | Status: DC | PRN
  Administered 2015-11-14: 14:00:00 via INTRAVENOUS

## 2015-11-14 MED ORDER — HEPARIN SODIUM (PORCINE) 1000 UNIT/ML IJ SOLN
INTRAMUSCULAR | Status: AC
  Filled 2015-11-14: qty 1

## 2015-11-14 MED ORDER — ASPIRIN 81 MG PO CHEW
324.0000 mg | CHEWABLE_TABLET | Freq: Every day | ORAL | Status: DC
  Filled 2015-11-14: qty 4

## 2015-11-14 MED ORDER — SUCCINYLCHOLINE CHLORIDE 200 MG/10ML IV SOSY
PREFILLED_SYRINGE | INTRAVENOUS | Status: AC
  Filled 2015-11-14: qty 10

## 2015-11-14 MED ORDER — ROCURONIUM BROMIDE 10 MG/ML (PF) SYRINGE
PREFILLED_SYRINGE | INTRAVENOUS | Status: DC | PRN
  Administered 2015-11-14: 100 mg via INTRAVENOUS
  Administered 2015-11-14 (×2): 50 mg via INTRAVENOUS

## 2015-11-14 MED ORDER — SODIUM CHLORIDE 0.9 % IV SOLN
INTRAVENOUS | Status: DC | PRN
  Administered 2015-11-14: 500 mL

## 2015-11-14 MED ORDER — FENTANYL CITRATE (PF) 250 MCG/5ML IJ SOLN
INTRAMUSCULAR | Status: AC
  Filled 2015-11-14: qty 30

## 2015-11-14 MED ORDER — LIDOCAINE 2% (20 MG/ML) 5 ML SYRINGE
INTRAMUSCULAR | Status: DC | PRN
  Administered 2015-11-14: 80 mg via INTRAVENOUS

## 2015-11-14 MED ORDER — MILRINONE LACTATE IN DEXTROSE 20-5 MG/100ML-% IV SOLN
0.1250 ug/kg/min | INTRAVENOUS | Status: DC
  Administered 2015-11-15 – 2015-11-16 (×3): 0.25 ug/kg/min via INTRAVENOUS
  Filled 2015-11-14 (×3): qty 100

## 2015-11-14 MED ORDER — SODIUM CHLORIDE 0.9 % IV SOLN
INTRAVENOUS | Status: DC
  Filled 2015-11-14: qty 2.5

## 2015-11-14 MED ORDER — SODIUM CHLORIDE 0.9 % IV SOLN: 250.0000 mL | INTRAVENOUS | Status: DC

## 2015-11-14 MED ORDER — ALBUMIN HUMAN 5 % IV SOLN
250.0000 mL | INTRAVENOUS | Status: AC | PRN
  Administered 2015-11-14 – 2015-11-15 (×4): 250 mL via INTRAVENOUS
  Filled 2015-11-14 (×2): qty 250

## 2015-11-14 MED ORDER — INSULIN REGULAR BOLUS VIA INFUSION
0.0000 [IU] | Freq: Three times a day (TID) | INTRAVENOUS | Status: DC
  Filled 2015-11-14: qty 10

## 2015-11-14 MED ORDER — MIDAZOLAM HCL 10 MG/2ML IJ SOLN
INTRAMUSCULAR | Status: AC
  Filled 2015-11-14: qty 2

## 2015-11-14 MED ORDER — METOCLOPRAMIDE HCL 5 MG/ML IJ SOLN
10.0000 mg | Freq: Four times a day (QID) | INTRAMUSCULAR | Status: DC
  Administered 2015-11-15 – 2015-11-17 (×12): 10 mg via INTRAVENOUS
  Filled 2015-11-14 (×12): qty 2

## 2015-11-14 MED ORDER — DEXMEDETOMIDINE HCL IN NACL 200 MCG/50ML IV SOLN
0.0000 ug/kg/h | INTRAVENOUS | Status: DC
  Administered 2015-11-14: 0.7 ug/kg/h via INTRAVENOUS
  Administered 2015-11-14: 0.5 ug/kg/h via INTRAVENOUS
  Administered 2015-11-14: 0.7 ug/kg/h via INTRAVENOUS
  Filled 2015-11-14 (×5): qty 50

## 2015-11-14 MED ORDER — GLYCOPYRROLATE 0.2 MG/ML IJ SOLN
INTRAMUSCULAR | Status: DC | PRN
  Administered 2015-11-14 (×3): 0.2 mg via INTRAVENOUS

## 2015-11-14 MED ORDER — LACTATED RINGERS IV SOLN: 500.0000 mL | Freq: Once | INTRAVENOUS | Status: DC | PRN

## 2015-11-14 MED ORDER — THROMBIN 5000 UNITS EX SOLR
CUTANEOUS | Status: AC
  Filled 2015-11-14: qty 5000

## 2015-11-14 MED ORDER — GLYCOPYRROLATE 0.2 MG/ML IV SOSY
PREFILLED_SYRINGE | INTRAVENOUS | Status: AC
  Filled 2015-11-14: qty 3

## 2015-11-14 MED ORDER — SODIUM CHLORIDE 0.9 % IJ SOLN
OROMUCOSAL | Status: DC | PRN
  Administered 2015-11-14 (×5): 4 mL via TOPICAL

## 2015-11-14 MED ORDER — METOPROLOL TARTRATE 25 MG/10 ML ORAL SUSPENSION: 12.5000 mg | Freq: Two times a day (BID) | ORAL | Status: DC

## 2015-11-14 MED ORDER — LACTATED RINGERS IV SOLN
INTRAVENOUS | Status: DC | PRN
  Administered 2015-11-14 (×6): via INTRAVENOUS

## 2015-11-14 MED ORDER — BISACODYL 5 MG PO TBEC
10.0000 mg | DELAYED_RELEASE_TABLET | Freq: Every day | ORAL | Status: DC
  Administered 2015-11-15 – 2015-11-17 (×3): 10 mg via ORAL
  Filled 2015-11-14 (×3): qty 2

## 2015-11-14 MED ORDER — LIDOCAINE HCL (PF) 1 % IJ SOLN
INTRAMUSCULAR | Status: AC
  Filled 2015-11-14: qty 30

## 2015-11-14 MED ORDER — HEMOSTATIC AGENTS (NO CHARGE) OPTIME
TOPICAL | Status: DC | PRN
Start: 2015-11-14 — End: 2015-11-14
  Administered 2015-11-14: 1 via TOPICAL
  Administered 2015-11-14: 2 via TOPICAL

## 2015-11-14 MED ORDER — ANTISEPTIC ORAL RINSE SOLUTION (CORINZ)
7.0000 mL | OROMUCOSAL | Status: DC
  Administered 2015-11-14 – 2015-11-15 (×2): 7 mL via OROMUCOSAL

## 2015-11-14 MED ORDER — SODIUM CHLORIDE 0.9% FLUSH: 3.0000 mL | INTRAVENOUS | Status: DC | PRN

## 2015-11-14 MED ORDER — NITROGLYCERIN IN D5W 200-5 MCG/ML-% IV SOLN: 0.0000 ug/min | INTRAVENOUS | Status: DC

## 2015-11-14 MED ORDER — DEXMEDETOMIDINE HCL IN NACL 200 MCG/50ML IV SOLN
INTRAVENOUS | Status: AC
  Filled 2015-11-14: qty 50

## 2015-11-14 MED ORDER — AMIODARONE HCL IN DEXTROSE 360-4.14 MG/200ML-% IV SOLN
30.0000 mg/h | INTRAVENOUS | Status: AC
  Administered 2015-11-14 – 2015-11-15 (×2): 30 mg/h via INTRAVENOUS
  Filled 2015-11-14 (×2): qty 200

## 2015-11-14 MED ORDER — ACETAMINOPHEN 160 MG/5ML PO SOLN: 650.0000 mg | Freq: Once | ORAL | Status: AC

## 2015-11-14 MED ORDER — CALCIUM CHLORIDE 10 % IV SOLN
INTRAVENOUS | Status: AC
  Filled 2015-11-14: qty 10

## 2015-11-14 MED FILL — Heparin Sodium (Porcine) Inj 1000 Unit/ML: INTRAMUSCULAR | Qty: 30 | Status: AC

## 2015-11-14 MED FILL — Potassium Chloride Inj 2 mEq/ML: INTRAVENOUS | Qty: 40 | Status: AC

## 2015-11-14 MED FILL — Magnesium Sulfate Inj 50%: INTRAMUSCULAR | Qty: 10 | Status: AC

## 2015-11-14 SURGICAL SUPPLY — 130 items
ADAPTER CARDIO PERF ANTE/RETRO (ADAPTER) ×6 IMPLANT
BAG DECANTER FOR FLEXI CONT (MISCELLANEOUS) ×6 IMPLANT
BANDAGE ELASTIC 4 VELCRO ST LF (GAUZE/BANDAGES/DRESSINGS) ×6 IMPLANT
BANDAGE ELASTIC 6 VELCRO ST LF (GAUZE/BANDAGES/DRESSINGS) ×6 IMPLANT
BASKET HEART  (ORDER IN 25'S) (MISCELLANEOUS) ×1
BASKET HEART (ORDER IN 25'S) (MISCELLANEOUS) ×1
BASKET HEART (ORDER IN 25S) (MISCELLANEOUS) ×4 IMPLANT
BLADE STERNUM SYSTEM 6 (BLADE) ×6 IMPLANT
BLADE SURG 12 STRL SS (BLADE) ×6 IMPLANT
BLADE SURG ROTATE 9660 (MISCELLANEOUS) ×6 IMPLANT
BNDG GAUZE ELAST 4 BULKY (GAUZE/BANDAGES/DRESSINGS) ×6 IMPLANT
CANISTER SUCTION 2500CC (MISCELLANEOUS) ×12 IMPLANT
CANNULA GUNDRY RCSP 15FR (MISCELLANEOUS) ×6 IMPLANT
CATH CPB KIT VANTRIGT (MISCELLANEOUS) ×6 IMPLANT
CATH ROBINSON RED A/P 18FR (CATHETERS) ×24 IMPLANT
CATH THORACIC 36FR RT ANG (CATHETERS) ×6 IMPLANT
CLIP FOGARTY SPRING 6M (CLIP) ×6 IMPLANT
CLIP RETRACTION 3.0MM CORONARY (MISCELLANEOUS) ×6 IMPLANT
CLIP TI MEDIUM 6 (CLIP) IMPLANT
CLIP TI WIDE RED SMALL 24 (CLIP) ×6 IMPLANT
CLIP TI WIDE RED SMALL 6 (CLIP) IMPLANT
CRADLE DONUT ADULT HEAD (MISCELLANEOUS) ×12 IMPLANT
DRAIN CHANNEL 10M FLAT 3/4 FLT (DRAIN) ×6 IMPLANT
DRAIN CHANNEL 15F RND FF W/TCR (WOUND CARE) IMPLANT
DRAIN CHANNEL 32F RND 10.7 FF (WOUND CARE) ×6 IMPLANT
DRAPE CARDIOVASCULAR INCISE (DRAPES) ×2
DRAPE SLUSH/WARMER DISC (DRAPES) ×6 IMPLANT
DRAPE SRG 135X102X78XABS (DRAPES) ×4 IMPLANT
DRSG AQUACEL AG ADV 3.5X14 (GAUZE/BANDAGES/DRESSINGS) ×6 IMPLANT
ELECT BLADE 4.0 EZ CLEAN MEGAD (MISCELLANEOUS) ×6
ELECT BLADE 6.5 EXT (BLADE) ×6 IMPLANT
ELECT CAUTERY BLADE 6.4 (BLADE) ×6 IMPLANT
ELECT REM PT RETURN 9FT ADLT (ELECTROSURGICAL) ×18
ELECTRODE BLDE 4.0 EZ CLN MEGD (MISCELLANEOUS) ×4 IMPLANT
ELECTRODE REM PT RTRN 9FT ADLT (ELECTROSURGICAL) ×12 IMPLANT
EVACUATOR SILICONE 100CC (DRAIN) ×6 IMPLANT
FELT TEFLON 1X6 (MISCELLANEOUS) ×12 IMPLANT
GAUZE SPONGE 4X4 12PLY STRL (GAUZE/BANDAGES/DRESSINGS) ×12 IMPLANT
GLOVE BIO SURGEON STRL SZ 6.5 (GLOVE) ×30 IMPLANT
GLOVE BIO SURGEON STRL SZ7.5 (GLOVE) ×24 IMPLANT
GLOVE BIO SURGEONS STRL SZ 6.5 (GLOVE) ×6
GLOVE BIOGEL PI IND STRL 6.5 (GLOVE) ×24 IMPLANT
GLOVE BIOGEL PI IND STRL 7.5 (GLOVE) ×4 IMPLANT
GLOVE BIOGEL PI INDICATOR 6.5 (GLOVE) ×12
GLOVE BIOGEL PI INDICATOR 7.5 (GLOVE) ×2
GOWN STRL REUS W/ TWL LRG LVL3 (GOWN DISPOSABLE) ×24 IMPLANT
GOWN STRL REUS W/ TWL XL LVL3 (GOWN DISPOSABLE) ×4 IMPLANT
GOWN STRL REUS W/TWL LRG LVL3 (GOWN DISPOSABLE) ×12
GOWN STRL REUS W/TWL XL LVL3 (GOWN DISPOSABLE) ×2
HEMOSTAT POWDER SURGIFOAM 1G (HEMOSTASIS) ×42 IMPLANT
HEMOSTAT SNOW SURGICEL 2X4 (HEMOSTASIS) IMPLANT
HEMOSTAT SURGICEL 2X14 (HEMOSTASIS) ×18 IMPLANT
INSERT FOGARTY SM (MISCELLANEOUS) ×6 IMPLANT
INSERT FOGARTY XLG (MISCELLANEOUS) IMPLANT
IV ADAPTER SYR DOUBLE MALE LL (MISCELLANEOUS) IMPLANT
KIT BASIN OR (CUSTOM PROCEDURE TRAY) ×12 IMPLANT
KIT ROOM TURNOVER OR (KITS) ×12 IMPLANT
KIT SUCTION CATH 14FR (SUCTIONS) ×6 IMPLANT
KIT VASOVIEW 6 PRO VH 2400 (KITS) ×6 IMPLANT
LEAD PACING MYOCARDI (MISCELLANEOUS) ×6 IMPLANT
LIQUID BAND (GAUZE/BANDAGES/DRESSINGS) ×6 IMPLANT
MARKER GRAFT CORONARY BYPASS (MISCELLANEOUS) ×18 IMPLANT
NEEDLE HYPO 25GX1X1/2 BEV (NEEDLE) IMPLANT
NS IRRIG 1000ML POUR BTL (IV SOLUTION) ×48 IMPLANT
PACK CAROTID (CUSTOM PROCEDURE TRAY) ×6 IMPLANT
PACK OPEN HEART (CUSTOM PROCEDURE TRAY) ×6 IMPLANT
PAD ARMBOARD 7.5X6 YLW CONV (MISCELLANEOUS) ×24 IMPLANT
PAD ELECT DEFIB RADIOL ZOLL (MISCELLANEOUS) ×6 IMPLANT
PATCH VASC XENOSURE 1CMX6CM (Vascular Products) ×2 IMPLANT
PATCH VASC XENOSURE 1X6 (Vascular Products) ×4 IMPLANT
PENCIL BUTTON HOLSTER BLD 10FT (ELECTRODE) ×6 IMPLANT
PUNCH AORTIC ROTATE  4.5MM 8IN (MISCELLANEOUS) ×6 IMPLANT
PUNCH AORTIC ROTATE 4.0MM (MISCELLANEOUS) IMPLANT
PUNCH AORTIC ROTATE 4.5MM 8IN (MISCELLANEOUS) IMPLANT
PUNCH AORTIC ROTATE 5MM 8IN (MISCELLANEOUS) IMPLANT
SET CARDIOPLEGIA MPS 5001102 (MISCELLANEOUS) ×6 IMPLANT
SET COLLECT BLD 21X3/4 12 PB (MISCELLANEOUS) ×6 IMPLANT
SHUNT CAROTID BYPASS 10 (VASCULAR PRODUCTS) ×12 IMPLANT
SHUNT CAROTID BYPASS 12FRX15.5 (VASCULAR PRODUCTS) IMPLANT
SPOGE SURGIFLO 8M (HEMOSTASIS) ×2
SPONGE GAUZE 4X4 12PLY STER LF (GAUZE/BANDAGES/DRESSINGS) ×12 IMPLANT
SPONGE LAP 18X18 X RAY DECT (DISPOSABLE) ×6 IMPLANT
SPONGE SURGIFLO 8M (HEMOSTASIS) ×4 IMPLANT
STOPCOCK 4 WAY LG BORE MALE ST (IV SETS) IMPLANT
SURGIFLO W/THROMBIN 8M KIT (HEMOSTASIS) ×6 IMPLANT
SUT BONE WAX W31G (SUTURE) ×6 IMPLANT
SUT ETHILON 3 0 PS 1 (SUTURE) ×6 IMPLANT
SUT MNCRL AB 4-0 PS2 18 (SUTURE) IMPLANT
SUT PROLENE 3 0 SH DA (SUTURE) IMPLANT
SUT PROLENE 3 0 SH1 36 (SUTURE) IMPLANT
SUT PROLENE 4 0 RB 1 (SUTURE) ×2
SUT PROLENE 4 0 SH DA (SUTURE) ×6 IMPLANT
SUT PROLENE 4-0 RB1 .5 CRCL 36 (SUTURE) ×4 IMPLANT
SUT PROLENE 5 0 C 1 36 (SUTURE) IMPLANT
SUT PROLENE 6 0 BV (SUTURE) ×12 IMPLANT
SUT PROLENE 6 0 C 1 30 (SUTURE) ×12 IMPLANT
SUT PROLENE 6 0 CC (SUTURE) ×30 IMPLANT
SUT PROLENE 7 0 BV 1 (SUTURE) IMPLANT
SUT PROLENE 8 0 BV175 6 (SUTURE) IMPLANT
SUT PROLENE BLUE 7 0 (SUTURE) ×12 IMPLANT
SUT SILK  1 MH (SUTURE)
SUT SILK 1 MH (SUTURE) IMPLANT
SUT SILK 2 0 SH CR/8 (SUTURE) ×6 IMPLANT
SUT SILK 3 0 (SUTURE)
SUT SILK 3 0 SH CR/8 (SUTURE) IMPLANT
SUT SILK 3-0 18XBRD TIE 12 (SUTURE) IMPLANT
SUT STEEL 6MS V (SUTURE) ×12 IMPLANT
SUT STEEL SZ 6 DBL 3X14 BALL (SUTURE) ×6 IMPLANT
SUT VIC AB 1 CTX 18 (SUTURE) ×6 IMPLANT
SUT VIC AB 1 CTX 36 (SUTURE) ×10
SUT VIC AB 1 CTX36XBRD ANBCTR (SUTURE) ×20 IMPLANT
SUT VIC AB 2-0 CT1 27 (SUTURE) ×2
SUT VIC AB 2-0 CT1 TAPERPNT 27 (SUTURE) ×4 IMPLANT
SUT VIC AB 2-0 CTX 27 (SUTURE) ×6 IMPLANT
SUT VIC AB 3-0 SH 27 (SUTURE) ×4
SUT VIC AB 3-0 SH 27X BRD (SUTURE) ×8 IMPLANT
SUT VIC AB 3-0 X1 27 (SUTURE) ×12 IMPLANT
SUT VICRYL 4-0 PS2 18IN ABS (SUTURE) ×6 IMPLANT
SUTURE E-PAK OPEN HEART (SUTURE) ×6 IMPLANT
SYR CONTROL 10ML LL (SYRINGE) IMPLANT
SYSTEM SAHARA CHEST DRAIN ATS (WOUND CARE) ×6 IMPLANT
TAPE CLOTH SURG 4X10 WHT LF (GAUZE/BANDAGES/DRESSINGS) ×12 IMPLANT
TOWEL OR 17X24 6PK STRL BLUE (TOWEL DISPOSABLE) ×12 IMPLANT
TOWEL OR 17X26 10 PK STRL BLUE (TOWEL DISPOSABLE) ×18 IMPLANT
TRAY FOLEY IC TEMP SENS 16FR (CATHETERS) ×6 IMPLANT
TUBING ART PRESS 48 MALE/FEM (TUBING) ×6 IMPLANT
TUBING EXTENTION W/L.L. (IV SETS) IMPLANT
TUBING INSUFFLATION (TUBING) ×6 IMPLANT
UNDERPAD 30X30 INCONTINENT (UNDERPADS AND DIAPERS) ×6 IMPLANT
WATER STERILE IRR 1000ML POUR (IV SOLUTION) ×18 IMPLANT

## 2015-11-14 NOTE — Progress Notes (Signed)
Pt ABG within parameters, per Dr. Lucianne Lei Trigt's progress note will begin weaning off Precedex. RT aware. Will continue to monitor closely.  Sherlie Ban, RN

## 2015-11-14 NOTE — Progress Notes (Signed)
The patient was examined and preop studies reviewed. There has been no change from the prior exam and the patient is ready for surgery. Plan CABG on L Revolorio

## 2015-11-14 NOTE — Progress Notes (Signed)
Echocardiogram Echocardiogram Transesophageal has been performed.  Joelene Millin 11/14/2015, 9:52 AM

## 2015-11-14 NOTE — Anesthesia Procedure Notes (Signed)
Procedure Name: Intubation Date/Time: 11/14/2015 7:52 AM Performed by: Willeen Cass P Pre-anesthesia Checklist: Patient identified, Emergency Drugs available, Suction available, Patient being monitored and Timeout performed Patient Re-evaluated:Patient Re-evaluated prior to inductionOxygen Delivery Method: Circle system utilized Preoxygenation: Pre-oxygenation with 100% oxygen Intubation Type: IV induction Ventilation: Mask ventilation without difficulty and Oral airway inserted - appropriate to patient size Laryngoscope Size: Mac and 3 (Grade II view with cric BURP, Grade III without pressure) Grade View: Grade II Tube type: Oral Tube size: 8.0 mm Number of attempts: 1 Airway Equipment and Method: Stylet Placement Confirmation: ETT inserted through vocal cords under direct vision,  positive ETCO2 and breath sounds checked- equal and bilateral Secured at: 23 cm Tube secured with: Tape Dental Injury: Teeth and Oropharynx as per pre-operative assessment

## 2015-11-14 NOTE — Anesthesia Preprocedure Evaluation (Addendum)
Anesthesia Evaluation  Patient identified by MRN, date of birth, ID band Patient awake    Reviewed: Allergy & Precautions, H&P , NPO status , Patient's Chart, lab work & pertinent test results  Airway Mallampati: II   Neck ROM: full    Dental  (+) Dental Advisory Given   Pulmonary Current Smoker,    breath sounds clear to auscultation       Cardiovascular hypertension, + angina + CAD, + Past MI and + Peripheral Vascular Disease   Rhythm:regular Rate:Normal     Neuro/Psych Anxiety Depression    GI/Hepatic GERD  ,  Endo/Other  diabetes, Type 2Hypothyroidism Hyperthyroidism   Renal/GU Renal InsufficiencyRenal disease     Musculoskeletal  (+) Arthritis ,   Abdominal   Peds  Hematology   Anesthesia Other Findings   Reproductive/Obstetrics                            Anesthesia Physical Anesthesia Plan  ASA: III  Anesthesia Plan: General   Post-op Pain Management:    Induction: Intravenous  Airway Management Planned: Oral ETT  Additional Equipment: Arterial line, CVP, PA Cath, TEE and Ultrasound Guidance Line Placement  Intra-op Plan:   Post-operative Plan: Post-operative intubation/ventilation  Informed Consent: I have reviewed the patients History and Physical, chart, labs and discussed the procedure including the risks, benefits and alternatives for the proposed anesthesia with the patient or authorized representative who has indicated his/her understanding and acceptance.     Plan Discussed with: CRNA, Anesthesiologist and Surgeon  Anesthesia Plan Comments:         Anesthesia Quick Evaluation

## 2015-11-14 NOTE — Op Note (Signed)
OPERATIVE NOTE  PROCEDURE:   1.  left carotid endarterectomy with bovine patch angioplasty  PRE-OPERATIVE DIAGNOSIS: left asymptomatic carotid stenosis  POST-OPERATIVE DIAGNOSIS: same as above   SURGEON: Brandon C. Donzetta Matters,  MD  ASSISTANT(S): Gerri Lins  ANESTHESIA: general  ESTIMATED BLOOD LOSS: 150 cc  FINDING(S): Lesion to the level of hypoglossal Doppler with Left ICA continuous flow at completion   INDICATIONS:   65yo WM in need of lap cholecystectomy found to have significant coronary artery disease that was found to have asymptomatic carotid stenosis. He is now indicated for left carotid endarterectomy for which he has consented   DESCRIPTION: After full informed written consent was obtained from the patient, the patient was brought back to the operating room and placed supine upon the operating table.  Prior to induction, the patient received IV antibiotics.  After obtaining adequate anesthesia, the patient was placed into semi-Fowler position with a shoulder roll in place and the patient's neck slightly hyperextended and rotated away from the surgical site.    The patient was prepped in the standard fashion for a left carotid endarterectomy.  I made an incision anterior to the sternocleidomastoid muscle and dissected down through the subcutaneous tissue.  The platysmas was opened with electrocautery.  Then I dissected down to the internal jugular vein.  This was dissected posteriorly until I obtained visualization of the common carotid artery.  This was dissected out and then an umbilical tape was placed around the common carotid artery and I loosely applied a Rumel tourniquet.  I then dissected in a periadventitial fashion along the common carotid artery up to the bifurcation.  I then identified the external carotid artery and the superior thyroid artery.  A 2-0 silk tie was looped around the superior thyroid artery, and I also dissected out the external carotid artery  and placed a vessel loop around it.  In continuing the dissection to the internal carotid artery, I identified the facial vein.  This was ligated and then transected, giving me improved exposure of the internal carotid artery.  In the process of this dissection, the hypoglossal nerve was identified.  I then dissected out the internal carotid artery until I identified an area of soft tissue in the internal carotid artery and placed vessel loop around. The patient was heparinized and ACT checked return greater than 250.  then I clamped the internal carotid artery, external carotid artery and then the common carotid artery.  I then made an arteriotomy in the common carotid artery with a 11 blade, and extended the arteriotomy with a Potts scissor down into the common carotid artery, then I carried the arteriotomy through the bifurcation into the internal carotid artery until I reached an area that was not diseased.  At this point, I took the 10 shunt that previously been prepared and I inserted it into the internal carotid artery.   I used a shunt clamp on the ICA.  I unclamped the shunt to verify retrograde blood flow in the internal carotid artery.  I then placed the other end of the shunt into the common carotid artery after unclamping the artery.  The Rumel was tightened down around the shunt.  At this point, I verified blood flow in the shunt with a continuous doppler.  At this point, I started the endarterectomy in the common carotid artery with a Technical brewer and carried this dissection down into the common carotid artery circumferentially.  Then I transected the plaque at a segment where  it was adherent.  I then carried this dissection up into the external carotid artery.  The plaque was extracted by unclamping the external carotid artery and everting the artery.  The dissection was then carried into the internal carotid artery, extracting the remaining portion of the carotid plaque.  I passed the plaque  off the field as a specimen.  After verifying that there was no more loose intimal flaps or debris, I re-interrogated the entirety of this carotid artery.  At this point, I was satisfied that the minimal remaining disease was densely adherent to the wall and wall integrity was intact.  At this point, I then fashioned a bovine pericardial patch for the geometry of this artery and sewed it in place with two running stitch of 6-0 Prolene, one from each end.  Prior to completing this patch angioplasty, I removed the shunt first from the internal carotid artery, from which there was excellent backbleeding, and clamped it.  Then I removed the shunt from the common carotid artery, from which there was excellent antegrade bleeding, and then clamped it.  At this point, I allowed the external carotid artery to backbleed, which was excellent.  Then I instilled heparinized saline in this patched artery and then completed the patch angioplasty in the usual fashion. I first released the clamp on the external carotid artery, then I released it on the common carotid artery.  After waiting a few seconds, I then released it on the internal carotid artery.  I then interrogated this patient's arteries with the continuous Doppler.  The audible waveforms in each artery were consistent with the expected characteristics for each artery.  obtained hemostasis of the wound and placed Surgicel within the wound bed and closed the skin over top with 3-0 nylon sutures Dr. and tried to then perform the coronary aspect of this case.     COMPLICATIONS: none immediate   Brandon C. Donzetta Matters, MD [facility] [place] Office: [phone] Pager: [phone]

## 2015-11-14 NOTE — Care Management Note (Signed)
Case Management Note  Patient Details  Name: Brian Casey MRN: [id number] Date of Birth: 1950/11/08  Subjective/Objective:   Patient lives with spouse, pta indep, has a rollator at home, hs pcp and insurance for medications, wife will transport at [employer].  Pateint with  NSTEMI, had cath, for CABG/carotid on Thursday, also with cholecystitis has a biliary drain. Wife is insterested in getting handicapped sticker for vehicle.  NCM will cont to follow for dc needs.    Patient is also [name] patient, for dc needs contact Brian Casey [phone] or Brian Casey ext [phone]                 Action/Plan:   Expected Discharge Date:                  Expected Discharge Plan:  [place]  In-House Referral:     Discharge planning Services  CM Consult  Post Acute Care Choice:    Choice offered to:     DME Arranged:    DME Agency:     HH Arranged:    HH Agency:     Status of Service:  In process, will continue to follow  If discussed at Long Length of Stay Meetings, dates discussed:    Additional Comments:  Brian Mayo, RN 11/14/2015, 2:42 PM

## 2015-11-14 NOTE — Progress Notes (Signed)
Pt temp 35.8 via Swan-Ganz; applying Bair-Hugger. Will continue to monitor and assess for weaning criteria.  Sherlie Ban, RN

## 2015-11-14 NOTE — Progress Notes (Signed)
  Progress Note    11/14/2015 7:00 AM Day of Surgery  Subjective:  Feeling well this a.m  Vitals:   11/13/15 2308 11/14/15 0334  BP: (!) 155/64 (!) 155/87  Pulse: (!) 55 60  Resp: 14 14  Temp: 98 F (36.7 C) 98.3 F (36.8 C)    Physical Exam: Neuro: cn in tact, moving all 4 with 5/5 strength Cv: bradycardic in 50's Pulm: non labored Ext: no cce  CBC    Component Value Date/Time   WBC 8.3 11/14/2015 0500   RBC 3.24 (L) 11/14/2015 0500   HGB 10.4 (L) 11/14/2015 0500   HCT 31.0 (L) 11/14/2015 0500   PLT 252 11/14/2015 0500   MCV 95.7 11/14/2015 0500   MCH 32.1 11/14/2015 0500   MCHC 33.5 11/14/2015 0500   RDW 13.2 11/14/2015 0500   LYMPHSABS 0.4 (L) 11/09/2015 0450   MONOABS 0.7 11/09/2015 0450   EOSABS 0.1 11/09/2015 0450   BASOSABS 0.0 11/09/2015 0450    BMET    Component Value Date/Time   NA 133 (L) 11/14/2015 0500   K 3.9 11/14/2015 0500   CL 102 11/14/2015 0500   CO2 25 11/14/2015 0500   GLUCOSE 126 (H) 11/14/2015 0500   BUN 12 11/14/2015 0500   CREATININE 1.25 (H) 11/14/2015 0500   CALCIUM 8.7 (L) 11/14/2015 0500   GFRNONAA 60 (L) 11/14/2015 0500   GFRAA >60 11/14/2015 0500    INR    Component Value Date/Time   INR 1.19 11/12/2015 0640     Intake/Output Summary (Last 24 hours) at 11/14/15 0700 Last data filed at 11/14/15 0515  Gross per 24 hour  Intake           1135.5 ml  Output             2900 ml  Net          -1764.5 ml     Assessment:  65 y.o. male here with cholecystitis now s/p tube decompression, has high grade stenosis of left carotid and coronary disease.   Plan: OR today for Left CEA with concomitant CABG  Discussed risks and benefits with patient and wife yesterday, no new questions this a.m,  Consent signed and left side marked.    Mittie Knittel C. Donzetta Matters, MD [facility] [place] Office: [phone] Pager: [phone]  11/14/2015 7:00 AM

## 2015-11-14 NOTE — Transfer of Care (Signed)
Immediate Anesthesia Transfer of Care Note  Patient: Brian Casey  Procedure(s) Performed: Procedure(s): CORONARY ARTERY BYPASS GRAFTING (CABG) x 4 (N/A) TRANSESOPHAGEAL ECHOCARDIOGRAM (TEE) (N/A) LEFT ENDARTERECTOMY CAROTID (Left) RIGHT LEG GREATER SAPHENOUS VEIN HARVEST (Right)  Patient Location: SICU  Anesthesia Type:General  Level of Consciousness: sedated and Patient remains intubated per anesthesia plan  Airway & Oxygen Therapy: Patient remains intubated per anesthesia plan and Patient placed on Ventilator (see vital sign flow sheet for setting)  Post-op Assessment: Post -op Vital signs reviewed and stable  Post vital signs: Reviewed and stable  Last Vitals:  BP 103/51 PA 22/13 (17) HR 90 (A paced) RR (see vent settings flowsheet) SpO2 100% on 100% FiO2 T 12.4  Complications: No apparent anesthesia complications

## 2015-11-14 NOTE — Brief Op Note (Signed)
11/07/2015 - 11/14/2015  1:20 PM      [address]       [place] [address]             [phone]     11/07/2015 - 11/14/2015  1:21 PM  PATIENT:  Brian Casey  65 y.o. male  PRE-OPERATIVE DIAGNOSIS:  CAD, Left carotid artery stenosis  POST-OPERATIVE DIAGNOSIS:  CAD, Left carotid artery stenosis  PROCEDURE:  Procedure(s): CORONARY ARTERY BYPASS GRAFTING (CABG)X4  LIMA-LAD; SVG-RAMUS; SVG-DIAG; SVG-RCA ENDOSCOPIC GREATER SAPHENOUS VEIN HARVEST(EVH) RIGHT LEG TRANSESOPHAGEAL ECHOCARDIOGRAM (TEE) LEFT ENDARTERECTOMY CAROTID  SURGEON:  Surgeon(s): Ivin Poot, MD Waynetta Sandy, MD- (CEA)  PHYSICIAN ASSISTANT: Jeffrey Graefe PA-C  ANESTHESIA:   general  PATIENT CONDITION:  ICU - intubated and hemodynamically stable.  PRE-OPERATIVE WEIGHT: 86PY  COMPLICATIONS: NO KNOWN

## 2015-11-14 NOTE — Progress Notes (Addendum)
Pt axillary temp 97.6 Fahrenheit. Bair-Hugger removed. Will check ABG per Dr. Lucianne Lei Trigt's orders and proceed with weaning if within parameters.  Sherlie Ban, RN

## 2015-11-14 NOTE — Progress Notes (Signed)
CT surgery p.m. 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 Do not start ventilator wean unless PCO2 less than 45

## 2015-11-15 ENCOUNTER — Encounter (HOSPITAL_COMMUNITY): Payer: Self-pay | Admitting: Cardiothoracic Surgery

## 2015-11-15 ENCOUNTER — Inpatient Hospital Stay (HOSPITAL_COMMUNITY): Payer: Managed Care, Other (non HMO)

## 2015-11-15 LAB — POCT I-STAT, CHEM 8
BUN: 14 mg/dL (ref 6–20)
BUN: 14 mg/dL (ref 6–20)
CHLORIDE: 104 mmol/L (ref 101–111)
CREATININE: 1.4 mg/dL — AB (ref 0.61–1.24)
Calcium, Ion: 1.08 mmol/L — ABNORMAL LOW (ref 1.12–1.23)
Calcium, Ion: 1.14 mmol/L (ref 1.12–1.23)
Chloride: 97 mmol/L — ABNORMAL LOW (ref 101–111)
Creatinine, Ser: 1.2 mg/dL (ref 0.61–1.24)
Glucose, Bld: 141 mg/dL — ABNORMAL HIGH (ref 65–99)
Glucose, Bld: 184 mg/dL — ABNORMAL HIGH (ref 65–99)
HEMATOCRIT: 24 % — AB (ref 39.0–52.0)
HEMATOCRIT: 26 % — AB (ref 39.0–52.0)
HEMOGLOBIN: 8.8 g/dL — AB (ref 13.0–17.0)
Hemoglobin: 8.2 g/dL — ABNORMAL LOW (ref 13.0–17.0)
Potassium: 4.5 mmol/L (ref 3.5–5.1)
Potassium: 4 mmol/L (ref 3.5–5.1)
SODIUM: 138 mmol/L (ref 135–145)
SODIUM: 135 mmol/L (ref 135–145)
TCO2: 22 mmol/L (ref 0–100)
TCO2: 25 mmol/L (ref 0–100)

## 2015-11-15 LAB — CBC
HCT: 22.6 % — ABNORMAL LOW (ref 39.0–52.0)
HEMOGLOBIN: 7.8 g/dL — AB (ref 13.0–17.0)
MCH: 31.5 pg (ref 26.0–34.0)
MCHC: 34.5 g/dL (ref 30.0–36.0)
MCV: 91.1 fL (ref 78.0–100.0)
Platelets: 123 10*3/uL — ABNORMAL LOW (ref 150–400)
RBC: 2.48 MIL/uL — AB (ref 4.22–5.81)
RDW: 15.7 % — ABNORMAL HIGH (ref 11.5–15.5)
WBC: 10.9 10*3/uL — ABNORMAL HIGH (ref 4.0–10.5)

## 2015-11-15 LAB — POCT I-STAT 4, (NA,K, GLUC, HGB,HCT)
GLUCOSE: 110 mg/dL — AB (ref 65–99)
HEMATOCRIT: 26 % — AB (ref 39.0–52.0)
Hemoglobin: 8.8 g/dL — ABNORMAL LOW (ref 13.0–17.0)
POTASSIUM: 4.1 mmol/L (ref 3.5–5.1)
SODIUM: 141 mmol/L (ref 135–145)

## 2015-11-15 LAB — MAGNESIUM: MAGNESIUM: 2.5 mg/dL — AB (ref 1.7–2.4)

## 2015-11-15 LAB — POCT I-STAT 3, ART BLOOD GAS (G3+)
Acid-base deficit: 3 mmol/L — ABNORMAL HIGH (ref 0.0–2.0)
Acid-base deficit: 4 mmol/L — ABNORMAL HIGH (ref 0.0–2.0)
BICARBONATE: 22.5 meq/L (ref 20.0–24.0)
Bicarbonate: 21.4 mEq/L (ref 20.0–24.0)
O2 SAT: 98 %
O2 SAT: 95 %
PCO2 ART: 39.3 mmHg (ref 35.0–45.0)
PCO2 ART: 38.4 mmHg (ref 35.0–45.0)
PH ART: 7.352 (ref 7.350–7.450)
PO2 ART: 78 mmHg — AB (ref 80.0–100.0)
Patient temperature: 36.1
Patient temperature: 36.3
TCO2: 24 mmol/L (ref 0–100)
TCO2: 23 mmol/L (ref 0–100)
pH, Arterial: 7.361 (ref 7.350–7.450)
pO2, Arterial: 101 mmHg — ABNORMAL HIGH (ref 80.0–100.0)

## 2015-11-15 LAB — GLUCOSE, CAPILLARY
GLUCOSE-CAPILLARY: 111 mg/dL — AB (ref 65–99)
GLUCOSE-CAPILLARY: 125 mg/dL — AB (ref 65–99)
GLUCOSE-CAPILLARY: 135 mg/dL — AB (ref 65–99)
GLUCOSE-CAPILLARY: 138 mg/dL — AB (ref 65–99)
GLUCOSE-CAPILLARY: 140 mg/dL — AB (ref 65–99)
GLUCOSE-CAPILLARY: 152 mg/dL — AB (ref 65–99)
GLUCOSE-CAPILLARY: 193 mg/dL — AB (ref 65–99)
GLUCOSE-CAPILLARY: 200 mg/dL — AB (ref 65–99)
Glucose-Capillary: 107 mg/dL — ABNORMAL HIGH (ref 65–99)
Glucose-Capillary: 115 mg/dL — ABNORMAL HIGH (ref 65–99)
Glucose-Capillary: 120 mg/dL — ABNORMAL HIGH (ref 65–99)
Glucose-Capillary: 139 mg/dL — ABNORMAL HIGH (ref 65–99)
Glucose-Capillary: 144 mg/dL — ABNORMAL HIGH (ref 65–99)
Glucose-Capillary: 168 mg/dL — ABNORMAL HIGH (ref 65–99)
Glucose-Capillary: 175 mg/dL — ABNORMAL HIGH (ref 65–99)

## 2015-11-15 LAB — PREPARE PLATELET PHERESIS: UNIT DIVISION: 0

## 2015-11-15 LAB — PREPARE RBC (CROSSMATCH)

## 2015-11-15 LAB — PREPARE FRESH FROZEN PLASMA
Unit division: 0
Unit division: 0

## 2015-11-15 LAB — CARBOXYHEMOGLOBIN
Carboxyhemoglobin: 1.9 % — ABNORMAL HIGH (ref 0.5–1.5)
Methemoglobin: 1 % (ref 0.0–1.5)
O2 Saturation: 61.7 %
Total hemoglobin: 8 g/dL — ABNORMAL LOW (ref 13.5–18.0)

## 2015-11-15 LAB — CREATININE, SERUM
CREATININE: 1.41 mg/dL — AB (ref 0.61–1.24)
GFR calc Af Amer: 60 mL/min — ABNORMAL LOW (ref >60)
GFR, EST NON AFRICAN AMERICAN: 52 mL/min — AB (ref >60)

## 2015-11-15 LAB — POCT ACTIVATED CLOTTING TIME: ACTIVATED CLOTTING TIME: 279 s

## 2015-11-15 MED ORDER — CETYLPYRIDINIUM CHLORIDE 0.05 % MT LIQD
7.0000 mL | Freq: Two times a day (BID) | OROMUCOSAL | Status: DC
Start: 2015-11-15 — End: 2015-11-15
  Administered 2015-11-15: 7 mL via OROMUCOSAL

## 2015-11-15 MED ORDER — BOOST / RESOURCE BREEZE PO LIQD
1.0000 | Freq: Three times a day (TID) | ORAL | Status: DC
  Administered 2015-11-15 – 2015-11-18 (×10): 1 via ORAL

## 2015-11-15 MED ORDER — INSULIN ASPART 100 UNIT/ML ~~LOC~~ SOLN
0.0000 [IU] | SUBCUTANEOUS | Status: DC
  Administered 2015-11-15 – 2015-11-16 (×4): 4 [IU] via SUBCUTANEOUS
  Administered 2015-11-16: 2 [IU] via SUBCUTANEOUS
  Administered 2015-11-16: 4 [IU] via SUBCUTANEOUS
  Administered 2015-11-16 (×2): 2 [IU] via SUBCUTANEOUS
  Administered 2015-11-17: 4 [IU] via SUBCUTANEOUS

## 2015-11-15 MED ORDER — CHLORHEXIDINE GLUCONATE 0.12 % MT SOLN
15.0000 mL | Freq: Two times a day (BID) | OROMUCOSAL | Status: DC
  Administered 2015-11-15 – 2015-11-21 (×12): 15 mL via OROMUCOSAL
  Filled 2015-11-15 (×11): qty 15

## 2015-11-15 MED ORDER — MIDAZOLAM HCL 2 MG/2ML IJ SOLN: 2.0000 mg | Freq: Four times a day (QID) | INTRAMUSCULAR | Status: DC | PRN

## 2015-11-15 MED ORDER — ORAL CARE MOUTH RINSE
15.0000 mL | Freq: Two times a day (BID) | OROMUCOSAL | Status: DC
  Administered 2015-11-16 – 2015-11-20 (×10): 15 mL via OROMUCOSAL

## 2015-11-15 MED ORDER — FUROSEMIDE 10 MG/ML IJ SOLN: 20.0000 mg | Freq: Two times a day (BID) | INTRAMUSCULAR | Status: DC

## 2015-11-15 MED ORDER — FUROSEMIDE 10 MG/ML IJ SOLN
20.0000 mg | Freq: Two times a day (BID) | INTRAMUSCULAR | Status: DC
  Administered 2015-11-15 – 2015-11-17 (×4): 20 mg via INTRAVENOUS
  Filled 2015-11-15 (×4): qty 2

## 2015-11-15 MED ORDER — FE FUMARATE-B12-VIT C-FA-IFC PO CAPS
1.0000 | ORAL_CAPSULE | Freq: Two times a day (BID) | ORAL | Status: DC
  Administered 2015-11-15 – 2015-11-21 (×12): 1 via ORAL
  Filled 2015-11-15 (×12): qty 1

## 2015-11-15 MED ORDER — INSULIN DETEMIR 100 UNIT/ML ~~LOC~~ SOLN
14.0000 [IU] | Freq: Two times a day (BID) | SUBCUTANEOUS | Status: DC
  Administered 2015-11-15 – 2015-11-17 (×5): 14 [IU] via SUBCUTANEOUS
  Filled 2015-11-15 (×6): qty 0.14

## 2015-11-15 MED ORDER — LEVALBUTEROL HCL 1.25 MG/0.5ML IN NEBU: 1.2500 mg | INHALATION_SOLUTION | RESPIRATORY_TRACT | Status: DC | PRN

## 2015-11-15 MED ORDER — FUROSEMIDE 10 MG/ML IJ SOLN
40.0000 mg | Freq: Once | INTRAMUSCULAR | Status: AC
  Administered 2015-11-15: 40 mg via INTRAVENOUS
  Filled 2015-11-15: qty 4

## 2015-11-15 MED FILL — Electrolyte-R (PH 7.4) Solution: INTRAVENOUS | Qty: 6000 | Status: AC

## 2015-11-15 MED FILL — Mannitol IV Soln 20%: INTRAVENOUS | Qty: 500 | Status: AC

## 2015-11-15 MED FILL — Lidocaine HCl IV Inj 20 MG/ML: INTRAVENOUS | Qty: 5 | Status: AC

## 2015-11-15 MED FILL — Heparin Sodium (Porcine) Inj 1000 Unit/ML: INTRAMUSCULAR | Qty: 10 | Status: AC

## 2015-11-15 MED FILL — Calcium Chloride Inj 10%: INTRAVENOUS | Qty: 10 | Status: AC

## 2015-11-15 MED FILL — Albumin, Human Inj 5%: INTRAVENOUS | Qty: 250 | Status: AC

## 2015-11-15 MED FILL — Sodium Bicarbonate IV Soln 8.4%: INTRAVENOUS | Qty: 50 | Status: AC

## 2015-11-15 MED FILL — Sodium Chloride IV Soln 0.9%: INTRAVENOUS | Qty: 3000 | Status: AC

## 2015-11-15 NOTE — Progress Notes (Signed)
Nutrition Follow-up  DOCUMENTATION CODES:   Obesity unspecified  INTERVENTION:    Boost Breeze po TID, each supplement provides 250 kcal and 9 grams of protein  NEW NUTRITION DIAGNOSIS:   Increased nutrient needs related to  (post op healing) as evidenced by estimated needs; ongoing  GOAL:   Patient will meet greater than or equal to 90% of their needs; progressing  MONITOR:   PO intake, Supplement acceptance, Labs, Weight trends, I & O's  ASSESSMENT:   65 y.o. Male with h/o DM2, HTN, HLD, hypothyroidisim, and chronic alcohol abuse presented to [facility] on 11/04/15 with RUQ abdominal and was found to have acute cholecystitis on CT scan as well as new onset Afib with RVR with heart rates in the 180's bpm with troponin elevation of 0.27-->0.04 without chest pain.     Patient s/p procedure 8/25: CORONARY BYPASS GRAFTING x 4  TPN discontinued 8/20. Prior to surgery pt was on a Heart Healthy/Carbohydrate Modified diet. PO intake was variable at 25-100% per flowsheet records. Pt was also receiving oral nutrition supplements prior to surgery. Currently on Clear Liquids >> will add Boost Breeze TID. Labs and medications reviewed. CBG's [id number].  Diet Order:  Diet clear liquid Room service appropriate? Yes; Fluid consistency: Thin  Skin:  Reviewed, no issues  Last BM:  8/22  Height:   Ht Readings from Last 1 Encounters:  11/15/15 5' 7" (1.702 m)    Weight:   Wt Readings from Last 1 Encounters:  11/15/15 219 lb 12.8 oz (99.7 kg)    Ideal Body Weight:  67.2 kg  BMI:  Body mass index is 34.43 kg/m.  Estimated Nutritional Needs:   Kcal:  2200-2400  Protein:  120-130 gm  Fluid:  2.2-2.4 L  EDUCATION NEEDS:   No education needs identified at this time  Arthur Holms, RD, LDN Pager #: [phone] After-Hours Pager #: [phone]

## 2015-11-15 NOTE — Discharge Summary (Signed)
Physician Discharge Summary       [address]       [place] [address]             [phone]    Patient ID: Brian Casey MRN: [id number] DOB/AGE: 1951-02-06 65 y.o.  Admit date: 11/07/2015 Discharge date: 11/21/2015  Admission Diagnoses: 1. S/p NSTEMI 2. Coronary artery disease 3. Acute cholecystitis without calculus 4. E.Coli bacteremia  Active Diagnoses:  1. Diabetes mellitus type 2, uncontrolled ([place]) 2. HLD (hyperlipidemia) 3.Essential hypertension 4.  CKD (chronic kidney disease), stage II 5. Alcohol abuse 6. PAF (paroxysmal atrial fibrillation) ([place]) 7. Hypothyroidism 8. GERD (gastroesophageal reflux disease) 9. Protein-calorie malnutrition, severe ([place]) 10. ABL anemia  Consults: vascular surgery and internal medicine, general surgery, and IR  Procedure (s):  1.  Left Heart Cath and Coronary Angiography by Dr. Rockey Situ on 11/06/2015:  Conclusion    Dist RCA lesion, 95 %stenosed.  [place]amus lesion, 90 %stenosed.  Ost Cx lesion, 95 %stenosed.  Mid LAD lesion, 70 %stenosed.  Dist LAD lesion, 60 %stenosed.  Ost LM to LM lesion, 40 %stenosed.     2. Cholecystostomy tube by Dr. Earleen Newport on 11/08/2015.  3.  Left carotid endarterectomy with bovine patch angioplasty by Dr. Donzetta Matters on 11/14/2015 4. Coronary artery bypass grafting x4 (left internal mammary artery to     LAD, saphenous vein graft to diagonal, saphenous vein graft to     ramus intermediate, saphenous vein graft to posterior descending). 5. Endoscopic harvest of right leg greater saphenous vein by Dr. Prescott Gum on 11/14/2015.  History of Presenting Illness: The patient is a 65 year old male who initially presented to [facility] on 11/04/2015. He has multiple cardiac risk factors including DM 2, hypertension, and hyperlipidemia as well as multiple medical comorbidities including hypothyroidism and chronic alcohol abuse. He presented to the emergency department at [facility] with right upper quadrant abdominal pain and was found to have acute cholecystitis on CT scan. He was also found to be in atrial fibrillation with RVR with heart rates into the 180's bpm. He noted the abdominal pain 3 days prior but denied fevers or chills. He did note decreased by mouth intake. Abdominal ultrasound confirmed findings consistent with wall thickening and sludge without gallstones. CT scan findings did confirm acalculous cholecystitis. He was found to have a significant leukocytosis with white blood cell count of 24,000. Additionally he had hyponatremia with sodium of 121. He was found also to have some acute renal failure with a creatinine of 2.4. He was started on intravenous vancomycin and Zosyn by the internal medicine service and cardiology consultation was obtained. He was placed on amiodarone. Rate is  into good control with this as well as diltiazem. He was started on heparin as well. Troponins were noted to be elevated an echocardiogram was also done. It was felt that nuclear stress testing was appropriate and this was performed. This was felt to be a high-risk scan showing lateral wall ischemia. Cardiac catheterization was then performed and this revealed severe three-vessel coronary artery disease. He was transferred to [facility] for further management of his cholecystitis and plan for potential CABG when fully stabilized. He did rule in for NSTEMI. He is a smoker x 48 years and uses ETOH , admitting to at least 1 case of beer weekly.  Dr. Prescott Gum discussed the need for coronary artery bypass grafting surgery. Potential risks, benefits, and complications were discussed with the patient and he  agreed to proceed with surgery. Pre operative carotid duplex showed a 60-79% right internal carotid artery stenosis and an 80-99% left internal carotid artery stenosis.   Brief Hospital Course:  A consult was obtained with general surgery with findings of acute  cholecystitis. An IR consult was obtained and a cholecystostomy tube was placed on  08/18. A vascular consult was obtained regarding the findings of left internal carotid artery stenosis. Ultimately, the patient needed to undergo a left carotid endarterectomy and patch angio and a CABG x 4 on 08/24.  The patient was extubated early the morning of post operative day one without difficulty. He remained afebrile and hemodynamically stable. [name], a line, chest tubes, and foley were removed early in the post operative course. Lopressor was started and titrated accordingly. He/she was volume over loaded and diuresed. He/she had ABL anemia. He/she did not require a post op transfusion. He/she was weaned off the insulin drip. . The patient's HGA1C pre op was  7.1. Once he/she was tolerating a diet, home diabetic medicines were restarted.  The patient's glucose remained well controlledThe patient was felt surgically stable for transfer from the ICU to [facility] for further convalescence . He/she continues to progress with cardiac rehab. He/she was ambulating on room air. He/she has been tolerating a diet and has had a bowel movement. Epicardial pacing wires and chest tube sutures will be removed prior to discharge. The patient is felt surgically stable for discharge today.  The patient received a prolonged course of IV Zosyn postop to treat E Coli cholecystitis Bile culture returned sterile and antibiotics were stopped He is being DC'ed with Gall Bladder drain to bag and will be followed by [facility] He will return to see gen surgery in 6 weeks to plan cholecystectomy vs removal of drain.  Latest Vital Signs: Blood pressure 134/68, pulse 75, temperature 98.2 F (36.8 C), temperature source Oral, resp. rate 18, height _0  (1.702 m), weight 207 lb 8 oz (94.1 kg), SpO2 97 %.  Physical Exam: General- alert and comfortable   Lungs- clear without rales, wheezes   Cor- regular rate and rhythm, no murmur , gallop    Abdomen- soft, non-tender   Extremities - warm, non-tender, pitting edema   Neuro- oriented, appropriate, no focal weakness  Discharge Condition:Stable and discharged to home.  Recent laboratory studies:  Lab Results  Component Value Date   WBC 10.2 11/19/2015   HGB 9.1 (L) 11/19/2015   HCT 27.7 (L) 11/19/2015   MCV 92.6 11/19/2015   PLT 252 11/19/2015   Lab Results  Component Value Date   NA 136 11/19/2015   K 4.0 11/19/2015   CL 101 11/19/2015   CO2 28 11/19/2015   CREATININE 1.18 11/19/2015   GLUCOSE 105 (H) 11/19/2015   Diagnostic Studies:  Ct Angio Head W Or Wo Contrast  Result Date: 11/09/2015 CLINICAL DATA:  Noninvasive testing discovered BILATERAL carotid stenosis. Coronary artery disease. No neurologic symptoms. EXAM: CT ANGIOGRAPHY HEAD AND NECK TECHNIQUE: Multidetector CT imaging of the head and neck was performed using the standard protocol during bolus administration of intravenous contrast. Multiplanar CT image reconstructions and MIPs were obtained to evaluate the vascular anatomy. Carotid stenosis measurements (when applicable) are obtained utilizing NASCET criteria, using the distal internal carotid diameter as the denominator. CONTRAST:  50 mL Isovue 370. COMPARISON:  None. FINDINGS: CT HEAD Calvarium and skull base: No fracture or destructive lesion. Mastoids and middle ears are grossly clear. Paranasal sinuses: Layering fluid RIGHT greater than LEFT  maxillary sinuses. Orbits: Negative. Brain: No evidence of acute abnormality, including acute infarct, hemorrhage, hydrocephalus, or mass lesion. Mild atrophy, slightly premature for age. Slight hypoattenuation of white matter representing chronic microvascular ischemic change. CTA NECK Aortic arch: Standard branching. Imaged portion shows no evidence of aneurysm or dissection. No significant stenosis of the major arch vessel origins. Right carotid system: Moderate calcific and noncalcified plaque proximal RIGHT common  carotid artery, non stenotic. Irregular calcified and noncalcified plaque at the bifurcation, RIGHT ICA origin, 60-70% stenosis based on luminal measurements 1.9/5.6 proximal/distal. No evidence of dissection, or occlusion. Left carotid system: Non stenotic atheromatous change in the proximal LEFT common carotid artery. Irregular calcific and noncalcified plaque at the LEFT ICA origin, 90% or greater stenosis, based on luminal measurements of 0.6/4.1 proximal/distal. Some apparent reduction in luminal caliber of the cervical LEFT ICA could underestimate the degree of stenosis. No evidence of dissection, or occlusion. Vertebral arteries: Codominant. Calcified plaque at the LEFT vertebral origin estimated 50% stenosis. No evidence of dissection, or occlusion. Nonvascular soft tissues: BILATERAL pleural effusions. BILATERAL airspace opacities, correlate clinically for pneumonia versus edema, greater in the LEFT upper lobe. No neck masses. Airway midline. Central venous catheter without pneumothorax. Cervical spondylosis. In CTA HEAD Anterior circulation: Calcific plaque in the cavernous and supraclinoid RIGHT ICA segments, non stenotic. 50% tandem stenoses due to calcific plaque in the inferior and superior cavernous segments of the LEFT ICA. BILATERAL ICA supraclinoid and termini segments widely patent. No M1 stenosis or occlusion. Both anterior cerebral arteries unremarkable. No significant MCA branch stenosis or occlusion. No visible aneurysm or vascular malformation. Posterior circulation: Both vertebral arteries contribute to formation of basilar which is widely patent. No cerebellar branch occlusion or proximal PCA disease. No aneurysm, or vascular malformation. Venous sinuses: As permitted by contrast timing, patent. Anatomic variants: None of significance. Delayed phase:   No abnormal intracranial enhancement. IMPRESSION: Significant calcific and noncalcified plaque at the LEFT ICA origin, luminal diameter of  less than 1 mm, estimated 90% stenosis or greater. Similar less severe calcific and noncalcified plaque, RIGHT ICA origin, estimated 60-70% stenosis. Tandem 50% stenoses cavernous LEFT ICA, but no other intracranial atheromatous change of significance. No posterior circulation disease of significance. BILATERAL pleural effusions, and BILATERAL LEFT greater than RIGHT airspace opacities of uncertain significance. Correlate clinically for pneumonia versus edema. Electronically Signed   By: Staci Righter M.D.   On: 11/09/2015 11:30   Dg Chest 2 View  Result Date: 11/12/2015 CLINICAL DATA:  History of gastroesophageal reflux, hypertension, diabetes, coronary artery disease, current smoker. The patient underwent cholecystostomy on November 08, 2015. EXAM: CHEST  2 VIEW COMPARISON:  PA and lateral chest x-ray of November 07, 2015 FINDINGS: There remains mild volume loss on the right. The left lung is better inflated. There is persistent left basilar density. There are small bilateral pleural effusions layering posterior. The cardiac silhouette remains enlarged. The central pulmonary vascularity is engorged. There is calcification in the wall of the aortic arch. The PICC line tip projects over the midportion of the SVC. The bony thorax exhibits no acute abnormality. IMPRESSION: Mild CHF with bilateral pleural effusions. Left lower lobe atelectasis is suspected as well. No significant change since the earlier study. Electronically Signed   By: David  Martinique M.D.   On: 11/12/2015 07:48   Dg Chest 2 View  Result Date: 11/08/2015 CLINICAL DATA:  Shortness of breath. Abdominal pain. Gallbladder and heart complications. EXAM: CHEST  2 VIEW COMPARISON:  11/10/2013 FINDINGS: Mild cardiac enlargement  with central pulmonary vascular congestion. Mild interstitial pattern to the lungs likely represents early interstitial edema. Changes are new since prior study. Small bilateral pleural effusions. No pneumothorax. Calcified aorta.  IMPRESSION: Cardiac enlargement with mild central pulmonary vascular congestion, interstitial edema, and small pleural effusions. Electronically Signed   By: Lucienne Capers M.D.   On: 11/08/2015 01:46   Ct Angio Neck W Or Wo Contrast  Result Date: 11/09/2015 CLINICAL DATA:  Noninvasive testing discovered BILATERAL carotid stenosis. Coronary artery disease. No neurologic symptoms. EXAM: CT ANGIOGRAPHY HEAD AND NECK TECHNIQUE: Multidetector CT imaging of the head and neck was performed using the standard protocol during bolus administration of intravenous contrast. Multiplanar CT image reconstructions and MIPs were obtained to evaluate the vascular anatomy. Carotid stenosis measurements (when applicable) are obtained utilizing NASCET criteria, using the distal internal carotid diameter as the denominator. CONTRAST:  50 mL Isovue 370. COMPARISON:  None. FINDINGS: CT HEAD Calvarium and skull base: No fracture or destructive lesion. Mastoids and middle ears are grossly clear. Paranasal sinuses: Layering fluid RIGHT greater than LEFT maxillary sinuses. Orbits: Negative. Brain: No evidence of acute abnormality, including acute infarct, hemorrhage, hydrocephalus, or mass lesion. Mild atrophy, slightly premature for age. Slight hypoattenuation of white matter representing chronic microvascular ischemic change. CTA NECK Aortic arch: Standard branching. Imaged portion shows no evidence of aneurysm or dissection. No significant stenosis of the major arch vessel origins. Right carotid system: Moderate calcific and noncalcified plaque proximal RIGHT common carotid artery, non stenotic. Irregular calcified and noncalcified plaque at the bifurcation, RIGHT ICA origin, 60-70% stenosis based on luminal measurements 1.9/5.6 proximal/distal. No evidence of dissection, or occlusion. Left carotid system: Non stenotic atheromatous change in the proximal LEFT common carotid artery. Irregular calcific and noncalcified plaque at the  LEFT ICA origin, 90% or greater stenosis, based on luminal measurements of 0.6/4.1 proximal/distal. Some apparent reduction in luminal caliber of the cervical LEFT ICA could underestimate the degree of stenosis. No evidence of dissection, or occlusion. Vertebral arteries: Codominant. Calcified plaque at the LEFT vertebral origin estimated 50% stenosis. No evidence of dissection, or occlusion. Nonvascular soft tissues: BILATERAL pleural effusions. BILATERAL airspace opacities, correlate clinically for pneumonia versus edema, greater in the LEFT upper lobe. No neck masses. Airway midline. Central venous catheter without pneumothorax. Cervical spondylosis. In CTA HEAD Anterior circulation: Calcific plaque in the cavernous and supraclinoid RIGHT ICA segments, non stenotic. 50% tandem stenoses due to calcific plaque in the inferior and superior cavernous segments of the LEFT ICA. BILATERAL ICA supraclinoid and termini segments widely patent. No M1 stenosis or occlusion. Both anterior cerebral arteries unremarkable. No significant MCA branch stenosis or occlusion. No visible aneurysm or vascular malformation. Posterior circulation: Both vertebral arteries contribute to formation of basilar which is widely patent. No cerebellar branch occlusion or proximal PCA disease. No aneurysm, or vascular malformation. Venous sinuses: As permitted by contrast timing, patent. Anatomic variants: None of significance. Delayed phase:   No abnormal intracranial enhancement. IMPRESSION: Significant calcific and noncalcified plaque at the LEFT ICA origin, luminal diameter of less than 1 mm, estimated 90% stenosis or greater. Similar less severe calcific and noncalcified plaque, RIGHT ICA origin, estimated 60-70% stenosis. Tandem 50% stenoses cavernous LEFT ICA, but no other intracranial atheromatous change of significance. No posterior circulation disease of significance. BILATERAL pleural effusions, and BILATERAL LEFT greater than RIGHT  airspace opacities of uncertain significance. Correlate clinically for pneumonia versus edema. Electronically Signed   By: Staci Righter M.D.   On: 11/09/2015 11:30   Ct  Chest W Contrast  Result Date: 11/13/2015 CLINICAL DATA:  Shortness of breath.  COPD. EXAM: CT CHEST WITH CONTRAST TECHNIQUE: Multidetector CT imaging of the chest was performed during intravenous contrast administration. CONTRAST:  62m ISOVUE-300 IOPAMIDOL (ISOVUE-300) INJECTION 61% COMPARISON:  Chest radiograph on 11/12/2015 FINDINGS: Cardiovascular: Heart size within normal limits. Three-vessel coronary artery calcification. Aortic atherosclerosis. Right subclavian central venous catheter tip in distal SVC. Mediastinum/Nodes: No masses, pathologically enlarged lymph nodes, or other significant abnormality. Lungs/Pleura: Small pleural effusions seen bilaterally with mild dependent atelectasis. Patchy areas of airspace disease are seen in both upper lobes, most likely due to infectious or inflammatory process. No evidence of pulmonary mass. Central tracheobronchial airways are patent. Upper Abdomen: Diffuse hepatic steatosis and minimal perihepatic ascites. Musculoskeletal: No chest wall mass or suspicious bone lesions identified. IMPRESSION: Patchy bilateral upper lobe airspace disease, most likely infectious or inflammatory in etiology. Small bilateral pleural effusions and bibasilar atelectasis. No evidence of mass or lymphadenopathy. Hepatic steatosis and minimal perihepatic ascites. Aortic atherosclerosis and three-vessel coronary artery calcification. Electronically Signed   By: JEarle GellM.D.   On: 11/13/2015 13:34    Nm Myocar Multi W/spect W/wall Motion / Ef  Result Date: 11/06/2015 Pharmacological myocardial perfusion imaging study with moderate sized region of moderate intensity ischemia in the lateral wall Normal wall motion, EF estimated at 68% No EKG changes concerning for ischemia at peak stress or in recovery. High risk  scan given lateral wall ischemia, given recent troponin elevation Signed, TEsmond Plants MD, Ph.D [facility]   Ir Perc Cholecystostomy  Result Date: 11/08/2015 INDICATION: 65year old male with acute acalculous cholecystitis EXAM: IR IMAGE GUIDED DRAINAGE BY PERCUTANEOUS CATHETER MEDICATIONS: The patient is currently admitted to the hospital and receiving intravenous antibiotics. The antibiotics were administered within an appropriate time frame prior to the initiation of the procedure. ANESTHESIA/SEDATION: Fentanyl 50 mcg IV; Versed 1.0 mg IV, 4 mg Zofran Moderate Sedation Time:  35 minutes The patient was continuously monitored during the procedure by the interventional radiology nurse under my direct supervision. COMPLICATIONS: None PROCEDURE: Informed written consent was obtained from the patient and the patient's family after a thorough discussion of the procedural risks, benefits and alternatives. All questions were addressed. Maximal Sterile Barrier Technique was utilized including caps, mask, sterile gowns, sterile gloves, sterile drape, hand hygiene and skin antiseptic. A timeout was performed prior to the initiation of the procedure. Ultrasound survey of the right upper quadrant was performed for planning purposes. Once the patient is prepped and draped in the usual sterile fashion, the skin and subcutaneous tissues overlying the gallbladder were generously infiltrated 1% lidocaine for local anesthesia. Using ultrasound guidance, attempt was made to guide Chiba needle through liver parenchyma into the gallbladder. The arrangement of the gallbladder in the right upper quadrant precluded a liver passage, given the overlying ribs. Access was through the dome of the gallbladder. With removal of the stylet, spontaneous dark bile drainage occurred. Using modified Seldinger technique, a 10 French drain was placed into the gallbladder fossa, with aspiration of the sample for the lab. Contrast injection  confirmed position of the tube within the gallbladder lumen. Drainage catheter was attached to gravity drain with a suture retention placed. Patient tolerated the procedure well and remained hemodynamically stable throughout. No complications were encountered and no significant blood loss encountered. IMPRESSION: Status post percutaneous cholecystostomy. Sample of of bile was sent to the lab for analysis. Signed, JDulcy Fanny WEarleen Newport DO [facility] Electronically Signed  By: Corrie Mckusick D.O.   On: 11/08/2015 18:06   Dg Chest Port 1 View  Result Date: 11/15/2015 CLINICAL DATA:  Status post CABG yesterday EXAM: PORTABLE CHEST 1 VIEW COMPARISON:  Portable chest x-ray of November 14, 2015 FINDINGS: The trachea and esophagus have been extubated. The lungs are borderline to mildly hypo inflated. The heart is mildly enlarged. The central pulmonary vascularity is less engorged. There is no pneumothorax or significant pleural effusion. The mediastinal drain and left chest tube are in stable position. The Swan-Ganz catheter is stable projecting over the proximal right main pulmonary artery. The right-sided PICC line tip projects over the cavoatrial junction. The sternal wires are intact. IMPRESSION: Improved appearance of both lungs with decreased interstitial edema. Stable cardiomegaly with central pulmonary vascular prominence. No significant pleural effusion or pneumothorax. The support tubes are in stable position. Electronically Signed   By: David  Martinique M.D.   On: 11/15/2015 07:08   Dg Abd Portable 1v  Result Date: 11/08/2015 CLINICAL DATA:  No bowel sounds EXAM: PORTABLE ABDOMEN - 1 VIEW COMPARISON:  11/08/2015 FINDINGS: There is normal small bowel gas pattern. A cholecystostomy catheter is noted in right lower quadrant. Moderate gas noted within transverse colon. IMPRESSION: Normal small bowel gas pattern. Moderate gas noted in transverse colon.  Cholecystostomy catheter is noted in right upper quadrant. Electronically Signed   By: Lahoma Crocker M.D.   On: 11/08/2015 23:07    Discharge Instructions    Amb Referral to Cardiac Rehabilitation    Complete by:  As directed   Diagnosis:  CABG   CABG X ___:  4     Discharge Medications:   Medication List    STOP taking these medications   ALPRAZolam 0.25 MG tablet Commonly known as:  XANAX   aspirin 81 MG chewable tablet Replaced by:  aspirin 325 MG EC tablet   diltiazem 30 MG tablet Commonly known as:  CARDIZEM   losartan-hydrochlorothiazide 100-25 MG tablet Commonly known as:  HYZAAR   piperacillin-tazobactam 3.375 GM/50ML IVPB Commonly known as:  ZOSYN   temazepam 15 MG capsule Commonly known as:  RESTORIL     TAKE these medications   amiodarone 200 MG tablet Commonly known as:  PACERONE Take 1 tablet (200 mg total) by mouth daily. What changed:  medication strength  how much to take  when to take this   aspirin 325 MG EC tablet Take 1 tablet (325 mg total) by mouth daily. Replaces:  aspirin 81 MG chewable tablet   escitalopram 10 MG tablet Commonly known as:  LEXAPRO Take 1 tablet (10 mg total) by mouth daily.   ferrous [id number] C-folic acid capsule Commonly known as:  TRINSICON / FOLTRIN Take 1 capsule by mouth 2 (two) times daily with a meal.   furosemide 40 MG tablet Commonly known as:  LASIX Take 1 tablet (40 mg total) by mouth daily.   levothyroxine 100 MCG tablet Commonly known as:  SYNTHROID, LEVOTHROID Take 1 tablet (100 mcg total) by mouth daily before breakfast. OFFICE VISIT WITH  LABS REQUIRED FOR ADDITIONAL REFILLS   lisinopril 10 MG tablet Commonly known as:  PRINIVIL,ZESTRIL Take 1 tablet (10 mg total) by mouth daily.   metFORMIN 500 MG tablet Commonly known as:  GLUCOPHAGE Take 500 mg by mouth 2 (two) times daily with a meal.   metoprolol tartrate 25 MG tablet Commonly known as:  LOPRESSOR Take 1 tablet (25 mg  total) by mouth 2 (two) times daily.   omeprazole 20 MG  capsule Commonly known as:  PRILOSEC Take 1 capsule (20 mg total) by mouth daily. OFFICE VISIT REQUIRED FOR ADDITIONAL REFILLS   oxyCODONE 5 MG immediate release tablet Commonly known as:  Oxy IR/ROXICODONE Take 1-2 tablets (5-10 mg total) by mouth every 6 (six) hours as needed for severe pain.   potassium chloride SA 20 MEQ tablet Commonly known as:  K-DUR,KLOR-CON Take 1 tablet (20 mEq total) by mouth daily.   PROBIOTIC PO Take by mouth.   simvastatin 40 MG tablet Commonly known as:  ZOCOR Take 1 tablet (40 mg total) by mouth at bedtime.     The patient has been discharged on:   1.Beta Blocker:  Yes [  x ]                              No   [   ]                              If No, reason:  2.Ace Inhibitor/ARB: Yes [  x ]                                     No  [    ]                                     If No, reason:  3.Statin:   Yes [ x  ]                  No  [   ]                  If No, reason:  4.Ecasa:  Yes  [ x  ]                  No   [   ]                  If No, reason:  Follow Up Appointments: Follow-up Information    [name], MD. Schedule an appointment as soon as possible for a visit in 2 week(s).   Specialty:  General Surgery Why:  make appoint 2 weeks after DC Contact information: [address] [place] [address] [phone]        Servando Snare, MD Follow up in 4 week(s).   Specialties:  Vascular Surgery, Cardiology Why:  Office will call you to arrange your appt (sent) Contact information: [place] [address] [phone]        Tharon Aquas Kerby Less III, MD Follow up on 12/18/2015.   Specialty:  Cardiothoracic Surgery Why:  PA/LAT CXR to be taken (at [facility] which is in the same building as Dr. Lucianne Lei Trigt's office) on 12/18/2015 at 11:00 am;Appointment time is at 11:30 am Contact information: [place] [address] [phone]        Murray Hodgkins, NP Follow up on 12/06/2015.   Specialties:  Nurse Practitioner, Cardiology, Radiology Why:  Appointment time is at 2:30 pm Contact information: [address] [place] [address] [phone]           Signed: Macy Mis 11/21/2015,  7:59 AM

## 2015-11-15 NOTE — Progress Notes (Signed)
Ventilator settings placed on CPAP/PS by RT. Will check ABG in 20 minutes and continue to monitor closely.  Sherlie Ban, RN

## 2015-11-15 NOTE — Op Note (Signed)
NAMEDILLAN, CANDELA NO.:  [id number]  MEDICAL RECORD NO.:  [id number]  LOCATION:  2S11C                        FACILITY:  [facility]  PHYSICIAN:  Ivin Poot, M.D.  DATE OF BIRTH:  01/05/1951  DATE OF PROCEDURE:  11/14/2015 DATE OF DISCHARGE:                              OPERATIVE REPORT   OPERATION: 1. Coronary artery bypass grafting x4 (left internal mammary artery to     LAD, saphenous vein graft to diagonal, saphenous vein graft to     ramus intermediate, saphenous vein graft to posterior descending). 2. Endoscopic harvest of right leg greater saphenous vein.  SURGEON:  Ivin Poot, MD.  ASSISTANT:  John Giovanni, PA-C.  PREOPERATIVE DIAGNOSES:  Severe three-vessel coronary artery disease, non-ST-elevation myocardial infarction, unstable angina, preoperative acalculous cholecystitis with Escherichia coli biliary sepsis.  POSTOPERATIVE DIAGNOSES:  Severe three-vessel coronary artery disease, non-ST-elevation myocardial infarction, unstable angina, preoperative acalculous cholecystitis with Escherichia coli biliary sepsis.  CLINICAL NOTE:  The patient is a 65 year old, Caucasian male, with history of alcohol abuse, tobacco abuse, diabetes, and severe COPD, who presented to the hospital with biliary sepsis and was found to have acalculous cholecystitis.  His cardiac enzymes were positive and prior to cholecystectomy, Cardiology recommended catheterization.  This demonstrated severe three-vessel coronary artery disease with mild- moderate left main stenosis.  Cholecystectomy was canceled and the patient was transferred to this facility for cardiac surgery consideration.  At this hospital, his IV Zosyn was continued and his symptoms of abdominal pain, distention and nausea improved.  He underwent a General Surgery consultation and a cholecystostomy drain by IR was recommended.  This was placed and the gallbladder was draining approximately 100 mL of  bile daily.  The urological cultures were positive for E. coli sensitive to Zosyn.  The patient's overall condition improved after drainage of the gallbladder and he was felt to be in an adequate condition to undergo CABG.  Prior to surgery I discussed the procedure of CABG for treatment of his severe CAD with the patient and his family.  I discussed the expected benefits of the procedure, as well as the major aspects of the operation including the use of general anesthesia and cardiopulmonary bypass, the location of the surgical incisions, and the expected postoperative hospital recovery.  The patient was found to have a severe 90% left carotid stenosis during his pre CABG evaluation and this was treated with left carotid endarterectomy which will be dictated in a separate note by Dr. Servando Snare.  I discussed the risks of the procedure in detail with the patient including risks of MI, stroke, bleeding, blood transfusion requirement, postoperative infection, postoperative pulmonary problems including pleural effusion, and prolonged ventilator dependency and tracheostomy, and death.  After reviewing these issues, he demonstrated his understanding and agreed to proceed with surgery under what I felt was an informed consent.  OPERATIVE FINDINGS: 1. Coagulopathy-the patient received 2-3 units of packed cells as well     as FFP and platelets for this operation. 2. Adequate conduit for CABG. 3. Diffusely diseased coronary artery disease.  The distal circumflex     was atretic and too small to graft. 4. Preserved systolic LV function  after separation from     cardiopulmonary bypass.  OPERATIVE PROCEDURE:  The patient was brought to the operating room and placed supine on the operating table after general anesthesia was induced under invasive hemodynamic monitor.  The left neck, chest, abdomen and legs were prepped with Betadine and draped as a sterile field.  A left carotid  endarterectomy was performed by Dr. Donzetta Matters which will be dictated in a separate note.  After the endarterectomy was completed.  A second proper time-out was performed.  A sternal incision was made as the saphenous vein had been harvested endoscopically.  The left internal mammary artery was harvested as a pedicle graft from its origin at the subclavian vessels. This was a 1.5-mm vessel with good flow.  The sternal retractor was placed and the pericardium was opened and suspended.  The aorta was inspected, palpated and examined.  There was plaque but not calcification.  Pursestrings were placed in the ascending aorta and right atrium, and after the ACT had been documented as being therapeutic following heparin, the patient was cannulated and placed on cardiopulmonary bypass.  The coronaries were identified for grafting and mammary artery and vein grafts were prepared for the distal anastomoses. Cardioplegia cannulas were placed both antegrade and retrograde for cold blood cardioplegia.  The patient was cooled to 32 degrees and an aortic crossclamp was applied.  One liter of cold blood cardioplegia was delivered in split doses between the antegrade aortic and retrograde coronary sinus catheters.  There was good cardioplegic arrest, and septal temperature dropped less than 14 degrees.  Cardioplegia was delivered every 20 minutes while the crossclamp was placed.  The distal coronary anastomoses were performed.  The first distal anastomosis was to the posterior descending.  There was a 1.5-mm vessel with proximal 90-95% stenosis.  A reverse saphenous vein was sewn end-to- side with running 7-0 Prolene with good flow through the graft. Cardioplegia was redosed.  The second distal anastomosis was the ramus intermediate branch of left circumflex.  This was a 1.5-mm vessel with proximal 99% stenosis.  It was intramyocardial.  A reverse saphenous vein was sewn end-to-side with running 7-0  Prolene with good flow through the graft and cardioplegia was re-dosed.  The third distal anastomosis was the first diagonal branch of the LAD. This was between 2 significant lesions on the LAD and it was a 1.5-mm vessel.  A reverse saphenous vein was sewn end-to-side with running 7-0 Prolene.  There was good flow through the graft.  Cardioplegia was re- dosed.  The fourth distal anastomosis was distal third of the LAD.  The LAD was diffusely diseased and had a proximal 80% stenosis.  The left IMA pedicle was brought through an opening in the left lateral pericardium and brought down onto the LAD and sewn end-to-side with running 8-0 Prolene.  There was good flow through the anastomosis after briefly releasing the pedicle bulldog on the mammary artery.  The bulldog was reapplied and the pedicle was secured to the epicardium with 6-0 Prolenes.  Cardioplegia was redosed.  The cross-clamp was still in place.  Three proximal vein anastomoses were performed on the ascending aorta with a 4.5-mm punch running 6-0 Prolene.  Prior to tying down the final proximal anastomosis, air was vented from the coronaries with a dose of retrograde warm blood cardioplegia.  The cross-clamp was removed.  The heart resumed a spontaneous rhythm.  The vein grafts were de-aired and opened and each had good flow and hemostasis was documented at the  proximal and distal anastomoses.  The cardioplegia cannulas were removed.  The patient was rewarmed and reperfused.  Temporary pacing wires were applied.  The lungs were expanded and ventilator was resumed. A right pleural effusion was drained.  After the patient had been adequately re-perfused and re-warmed, he was weaned from cardiopulmonary bypass on low-dose 1.5-2 mcg of dopamine. Echo showed preserved LV systolic function.  Hemodynamics were stable. Cardiac output was satisfactory.  Protamine was administered without adverse reaction.  The patient remained  stable.  The cannulas were removed.  Hemodynamics remained stable.  The superior pericardial fat was closed over the aorta.  Anterior mediastinal and left pleural chest tube were placed and brought out through separate incisions.  There was still diffuse coagulopathy after reversal of heparin, and the patient was given platelets and FFP with improved coagulation function.  The sternum was closed with interrupted steel wire.  The pectoralis fascia was closed with a running #1 Vicryl.  The subcutaneous and skin layers were closed with running Vicryl.  The left neck incision was then inspected.  There was some bleeding from a muscle bleeder which was stopped with a figure-of-eight 6-0 Prolene transfixation suture.  The patch suture line was hemostatic.  The neck wound was irrigated.  A 10-French drain was placed.  The fascia was closed with interrupted #1 Vicryl.  The subcutaneous layer was closed with running 2-0 Vicryl and the skin was closed with a subcuticular.  Dressings were placed on the patient.  The patient's gallbladder drain had remained covered with a sterile sheet in the entire operation.  The patient was then transferred back to the ICU.     Ivin Poot, M.D.     PV/MEDQ  D:  11/14/2015  T:  11/15/2015  Job:  [id number]  cc:   Minna Merritts, MD

## 2015-11-15 NOTE — Procedures (Signed)
Extubation Procedure Note  Patient Details:   Name: Brian Casey DOB: [birth year] MRN: [id number]   Airway Documentation:     Evaluation  O2 sats: stable throughout Complications: No apparent complications Patient did tolerate procedure well. Bilateral Breath Sounds: Diminished, Clear   Yes   Pt extubated to 4L [place]. Pt able to vocalize post extubation. RT will continue to monitor.  Weldon Inches 11/15/2015, 2:25 AM

## 2015-11-15 NOTE — Progress Notes (Signed)
  Progress Note    11/15/2015 10:23 AM 1 Day Post-Op  Subjective:  No issues this morning, awake and alert  Vitals:   11/15/15 0645 11/15/15 0700  BP:  116/65  Pulse: 90 90  Resp: 10 10  Temp: 97 F (36.1 C) 97 F (36.1 C)    Physical Exam: Neuro: some tongue deviation to left No facial or lip droop, extremities with 5/5 strength Cv: rrr Pulm: non labored Neck: drain in place with thin sanguinous output   CBC    Component Value Date/Time   WBC 10.9 (H) 11/15/2015 0630   RBC 2.48 (L) 11/15/2015 0630   HGB 8.2 (L) 11/15/2015 0841   HCT 24.0 (L) 11/15/2015 0841   PLT 123 (L) 11/15/2015 0630   MCV 91.1 11/15/2015 0630   MCH 31.5 11/15/2015 0630   MCHC 34.5 11/15/2015 0630   RDW 15.7 (H) 11/15/2015 0630   LYMPHSABS 0.4 (L) 11/09/2015 0450   MONOABS 0.7 11/09/2015 0450   EOSABS 0.1 11/09/2015 0450   BASOSABS 0.0 11/09/2015 0450    BMET    Component Value Date/Time   NA 138 11/15/2015 0841   K 4.5 11/15/2015 0841   CL 104 11/15/2015 0841   CO2 23 11/14/2015 2100   GLUCOSE 141 (H) 11/15/2015 0841   BUN 14 11/15/2015 0841   CREATININE 1.20 11/15/2015 0841   CALCIUM 7.6 (L) 11/14/2015 2100   GFRNONAA 52 (L) 11/15/2015 0630   GFRAA 60 (L) 11/15/2015 0630    INR    Component Value Date/Time   INR 1.81 11/14/2015 1600     Intake/Output Summary (Last 24 hours) at 11/15/15 1023 Last data filed at 11/15/15 0700  Gross per 24 hour  Intake          8692.58 ml  Output            10768 ml  Net         -2075.42 ml     Assessment:  65 y.o. male is s/p left CEA combined with cabg, doing well this a.m.  Plan: -management per CT surgery -ok for drain out today -will follow while inpatient   Brandon C. Donzetta Matters, MD [facility] Office: [phone] Pager: [phone]  11/15/2015 10:23 AM

## 2015-11-15 NOTE — Progress Notes (Addendum)
Pt's AM labs not yet resulted; sent down @ 0330. This RN called lab, was told that the samples have been collected but cannot be found. Additional lab vials sent down STAT. Will continue to monitor.  Sherlie Ban, RN

## 2015-11-15 NOTE — Progress Notes (Signed)
NIF: -28 and VC: 637m prior to extubation with good effort.

## 2015-11-15 NOTE — Progress Notes (Signed)
Rapid wean protocol started by RT. Will continue to monitor closely.  Sherlie Ban, RN

## 2015-11-15 NOTE — Progress Notes (Signed)
Patient ID: Brian Casey, male   DOB: 12-Sep-1950, 65 y.o.   MRN: [id number]  SICU Evening Rounds:  Hemodynamically stable on Milrinone 0.25, low dose neo. sats 92% Urine output ok.  BMET    Component Value Date/Time   NA 135 11/15/2015 1651   K 4.0 11/15/2015 1651   CL 97 (L) 11/15/2015 1651   CO2 23 11/14/2015 2100   GLUCOSE 184 (H) 11/15/2015 1651   BUN 14 11/15/2015 1651   CREATININE 1.40 (H) 11/15/2015 1651   CALCIUM 7.6 (L) 11/14/2015 2100   GFRNONAA 52 (L) 11/15/2015 0630   GFRAA 60 (L) 11/15/2015 0630   CBC    Component Value Date/Time   WBC 10.9 (H) 11/15/2015 0630   RBC 2.48 (L) 11/15/2015 0630   HGB 8.8 (L) 11/15/2015 1651   HCT 26.0 (L) 11/15/2015 1651   PLT 123 (L) 11/15/2015 0630   MCV 91.1 11/15/2015 0630   MCH 31.5 11/15/2015 0630   MCHC 34.5 11/15/2015 0630   RDW 15.7 (H) 11/15/2015 0630   LYMPHSABS 0.4 (L) 11/09/2015 0450   MONOABS 0.7 11/09/2015 0450   EOSABS 0.1 11/09/2015 0450   BASOSABS 0.0 11/09/2015 0450   A/P: stable day. Continue current plans.

## 2015-11-15 NOTE — Progress Notes (Signed)
ABG within extubation parameters. RT made aware. Will continue to monitor.  Sherlie Ban, RN

## 2015-11-15 NOTE — Progress Notes (Signed)
   Left carotid dressing clean and dry, JP drain in place 200 cc out put total since surgery. Active range of motion all 4 extremities intact and grossly equal, sensation grossly intact all 4 extremities.  Slight left tongue deviation, no facial droop Speech is clear  Disposition stable s/p left CEA  Raynard Mapps MAUREEN PA-C

## 2015-11-15 NOTE — Progress Notes (Signed)
1 Day Post-Op Procedure(s) (LRB): CORONARY ARTERY BYPASS GRAFTING (CABG) x 4 (N/A) TRANSESOPHAGEAL ECHOCARDIOGRAM (TEE) (N/A) LEFT ENDARTERECTOMY CAROTID (Left) RIGHT LEG GREATER SAPHENOUS VEIN HARVEST (Right) Subjective: Status post multivessel CABG with combined left carotid endarterectomy Preoperative biliary sepsis with Escherichia coli and non-ST elevation MI Patient x-ray did with stable hemodynamics Sinus rhythm, cardiac index greater than 2 on low-dose milrinone We'll proceed with debridement of lines and mobilization Continue IV Zosyn for biliary sepsis Escherichia coli  Objective: Vital signs in last 24 hours: Temp:  [96.4 F (35.8 C)-98.2 F (36.8 C)] 97 F (36.1 C) (08/25 0700) Pulse Rate:  [65-90] 90 (08/25 0700) Cardiac Rhythm: Atrial paced (08/25 0400) Resp:  [9-25] 10 (08/25 0700) BP: (85-134)/(50-66) 116/65 (08/25 0700) SpO2:  [96 %-100 %] 98 % (08/25 0725) Arterial Line BP: (79-146)/(35-55) 132/47 (08/25 0700) FiO2 (%):  [40 %-100 %] 40 % (08/25 0108) Weight:  [219 lb 12.8 oz (99.7 kg)] 219 lb 12.8 oz (99.7 kg) (08/25 0400)  Hemodynamic parameters for last 24 hours: PAP: (19-35)/(11-25) 23/14 CO:  [3.9 L/min-5.8 L/min] 5.8 L/min CI:  [1.9 L/min/m2-2.8 L/min/m2] 2.8 L/min/m2  Intake/Output from previous day: 08/24 0701 - 08/25 0700 In: 8692.6 [I.V.:5401.6; Blood:1451; NG/GT:90; IV Piggyback:1750] Out: 11618 [Urine:2430; Emesis/NG output:100; Drains:200; Blood:1825; Chest Tube:540] Intake/Output this shift: No intake/output data recorded.       Exam    General- alert and comfortable   Lungs- clear without rales, wheezes   Cor- regular rate and rhythm, no murmur , gallop   Abdomen- soft, non-tender   Extremities - warm, non-tender, minimal edema   Neuro- oriented, appropriate, no focal weakness   Lab Results:  Recent Labs  11/14/15 2100 11/14/15 2107 11/15/15 0630  WBC 11.7*  --  10.9*  HGB 8.5* 8.2* 7.8*  HCT 25.2* 24.0* 22.6*  PLT 117*  --   123*   BMET:  Recent Labs  11/14/15 0500  11/14/15 2100 11/14/15 2107 11/15/15 0630  NA 133*  < > 134* 138  --   K 3.9  < > 4.7 4.8  --   CL 102  < > 105 102  --   CO2 25  --  23  --   --   GLUCOSE 126*  < > 112* 112*  --   BUN 12  < > 13 15  --   CREATININE 1.25*  < > 1.43* 1.20 1.41*  CALCIUM 8.7*  --  7.6*  --   --   < > = values in this interval not displayed.  PT/INR:  Recent Labs  11/14/15 1600  LABPROT 21.2*  INR 1.81   ABG    Component Value Date/Time   PHART 7.352 11/15/2015 0340   HCO3 21.4 11/15/2015 0340   TCO2 23 11/15/2015 0340   ACIDBASEDEF 4.0 (H) 11/15/2015 0340   O2SAT 61.7 11/15/2015 0625   CBG (last 3)   Recent Labs  11/15/15 0600 11/15/15 0634 11/15/15 0816  GLUCAP 138* 135* 139*    Assessment/Plan: S/P Procedure(s) (LRB): CORONARY ARTERY BYPASS GRAFTING (CABG) x 4 (N/A) TRANSESOPHAGEAL ECHOCARDIOGRAM (TEE) (N/A) LEFT ENDARTERECTOMY CAROTID (Left) RIGHT LEG GREATER SAPHENOUS VEIN HARVEST (Right) Mobilize Diuresis Diabetes control See progression orders   LOS: 8 days    Brian Casey 11/15/2015

## 2015-11-15 NOTE — Discharge Instructions (Signed)
Coronary Artery Bypass Grafting, Care After Refer to this sheet in the next few weeks. These instructions provide you with information on caring for yourself after your procedure. Your health care provider may also give you more specific instructions. Your treatment has been planned according to current medical practices, but problems sometimes occur. Call your health care provider if you have any problems or questions after your procedure. WHAT TO EXPECT AFTER THE PROCEDURE Recovery from surgery will be different for everyone. Some people feel well after 3 or 4 weeks, while for others it takes longer. After your procedure, it is typical to have the following:  Nausea and a lack of appetite.   Constipation.  Weakness and fatigue.   Depression or irritability.   Pain or discomfort at your incision site. HOME CARE INSTRUCTIONS  Take medicines only as directed by your health care provider. Do not stop taking medicines or start any new medicines without first checking with your health care provider.  Take your pulse as directed by your health care provider.  Perform deep breathing as directed by your health care provider. If you were given a device called an incentive spirometer, use it to practice deep breathing several times a day. Support your chest with a pillow or your arms when you take deep breaths or cough.  Keep incision areas clean, dry, and protected. Remove or change any bandages (dressings) only as directed by your health care provider. You may have skin adhesive strips over the incision areas. Do not take the strips off. They will fall off on their own.  Check incision areas daily for any swelling, redness, or drainage.  If incisions were made in your legs, do the following:  Avoid crossing your legs.   Avoid sitting for long periods of time. Change positions every 30 minutes.   Elevate your legs when you are sitting.  Wear compression stockings as directed by your  health care provider. These stockings help keep blood clots from forming in your legs.  Take showers once your health care provider approves. Until then, only take sponge baths. Pat incisions dry. Do not rub incisions with a washcloth or towel. Do not take baths, swim, or use a hot tub until your health care provider approves.  Eat foods that are high in fiber, such as raw fruits and vegetables, whole grains, beans, and nuts. Meats should be lean cut. Avoid canned, processed, and fried foods.  Drink enough fluid to keep your urine clear or pale yellow.  Weigh yourself every day. This helps identify if you are retaining fluid that may make your heart and lungs work harder.  Rest and limit activity as directed by your health care provider. You may be instructed to:  Stop any activity at once if you have chest pain, shortness of breath, irregular heartbeats, or dizziness. Get help right away if you have any of these symptoms.  Move around frequently for short periods or take short walks as directed by your health care provider. Increase your activities gradually. You may need physical therapy or cardiac rehabilitation to help strengthen your muscles and build your endurance.  Avoid lifting, pushing, or pulling anything heavier than 10 lb (4.5 kg) for at least 6 weeks after surgery.  Do not drive until your health care provider approves.  Ask your health care provider when you may return to work.  Ask your health care provider when you may resume sexual activity.  Keep all follow-up visits as directed by your health care  provider. This is important. SEEK MEDICAL CARE IF:  You have swelling, redness, increasing pain, or drainage at the site of an incision.  You have a fever.  You have swelling in your ankles or legs.  You have pain in your legs.   You gain 2 or more pounds (0.9 kg) a day.  You are nauseous or vomit.  You have diarrhea. SEEK IMMEDIATE MEDICAL CARE IF:  You have  chest pain that goes to your jaw or arms.  You have shortness of breath.   You have a fast or irregular heartbeat.   You notice a "clicking" in your breastbone (sternum) when you move.   You have numbness or weakness in your arms or legs.  You feel dizzy or light-headed.  MAKE SURE YOU:  Understand these instructions.  Will watch your condition.  Will get help right away if you are not doing well or get worse.   This information is not intended to replace advice given to you by your health care provider. Make sure you discuss any questions you have with your health care provider.   Document Released: 09/26/2004 Document Revised: 03/30/2014 Document Reviewed: 08/16/2012 Elsevier Interactive Patient Education [employer].

## 2015-11-16 ENCOUNTER — Inpatient Hospital Stay (HOSPITAL_COMMUNITY): Payer: Managed Care, Other (non HMO)

## 2015-11-16 LAB — GLUCOSE, CAPILLARY
GLUCOSE-CAPILLARY: 137 mg/dL — AB (ref 65–99)
GLUCOSE-CAPILLARY: 136 mg/dL — AB (ref 65–99)
Glucose-Capillary: 102 mg/dL — ABNORMAL HIGH (ref 65–99)
Glucose-Capillary: 139 mg/dL — ABNORMAL HIGH (ref 65–99)
Glucose-Capillary: 144 mg/dL — ABNORMAL HIGH (ref 65–99)
Glucose-Capillary: 180 mg/dL — ABNORMAL HIGH (ref 65–99)

## 2015-11-16 LAB — BASIC METABOLIC PANEL
Anion gap: 6 (ref 5–15)
BUN: 12 mg/dL (ref 6–20)
CO2: 26 mmol/L (ref 22–32)
Calcium: 8.1 mg/dL — ABNORMAL LOW (ref 8.9–10.3)
Chloride: 102 mmol/L (ref 101–111)
Creatinine, Ser: 1.44 mg/dL — ABNORMAL HIGH (ref 0.61–1.24)
GFR calc Af Amer: 58 mL/min — ABNORMAL LOW (ref >60)
GFR calc non Af Amer: 50 mL/min — ABNORMAL LOW (ref >60)
Glucose, Bld: 128 mg/dL — ABNORMAL HIGH (ref 65–99)
Potassium: 3.5 mmol/L (ref 3.5–5.1)
Sodium: 134 mmol/L — ABNORMAL LOW (ref 135–145)

## 2015-11-16 LAB — CBC
HCT: 26.4 % — ABNORMAL LOW (ref 39.0–52.0)
Hemoglobin: 8.8 g/dL — ABNORMAL LOW (ref 13.0–17.0)
MCH: 30.3 pg (ref 26.0–34.0)
MCHC: 33.3 g/dL (ref 30.0–36.0)
MCV: 91 fL (ref 78.0–100.0)
Platelets: 143 10*3/uL — ABNORMAL LOW (ref 150–400)
RBC: 2.9 MIL/uL — ABNORMAL LOW (ref 4.22–5.81)
RDW: 16.6 % — ABNORMAL HIGH (ref 11.5–15.5)
WBC: 11.6 10*3/uL — ABNORMAL HIGH (ref 4.0–10.5)

## 2015-11-16 MED ORDER — AMIODARONE IV BOLUS ONLY 150 MG/100ML
150.0000 mg | Freq: Once | INTRAVENOUS | Status: AC
  Administered 2015-11-16: 150 mg via INTRAVENOUS
  Filled 2015-11-16: qty 100

## 2015-11-16 MED ORDER — POTASSIUM CHLORIDE 10 MEQ/50ML IV SOLN
10.0000 meq | INTRAVENOUS | Status: AC | PRN
  Administered 2015-11-16 (×4): 10 meq via INTRAVENOUS
  Filled 2015-11-16: qty 50

## 2015-11-16 MED ORDER — AMIODARONE HCL 200 MG PO TABS
400.0000 mg | ORAL_TABLET | Freq: Two times a day (BID) | ORAL | Status: DC
  Administered 2015-11-16 – 2015-11-18 (×5): 400 mg via ORAL
  Filled 2015-11-16 (×5): qty 2

## 2015-11-16 NOTE — Progress Notes (Signed)
2 Days Post-Op Procedure(s) (LRB): CORONARY ARTERY BYPASS GRAFTING (CABG) x 4 (N/A) TRANSESOPHAGEAL ECHOCARDIOGRAM (TEE) (N/A) LEFT ENDARTERECTOMY CAROTID (Left) RIGHT LEG GREATER SAPHENOUS VEIN HARVEST (Right) Subjective:  No complaints  Objective: Vital signs in last 24 hours: Temp:  [97.6 F (36.4 C)-98.3 F (36.8 C)] 98.3 F (36.8 C) (08/26 0900) Pulse Rate:  [25-169] 25 (08/26 0900) Cardiac Rhythm: Atrial fibrillation (08/26 0900) Resp:  [11-20] 20 (08/26 0900) BP: (103-139)/(51-75) 126/71 (08/26 0900) SpO2:  [88 %-98 %] 94 % (08/26 0900) Arterial Line BP: (152)/(45) 152/45 (08/25 1200) Weight:  [97.7 kg (215 lb 6.2 oz)] 97.7 kg (215 lb 6.2 oz) (08/26 0500)  Hemodynamic parameters for last 24 hours:    Intake/Output from previous day: 08/25 0701 - 08/26 0700 In: 2794.5 [P.O.:1200; I.V.:831.1; Blood:363.3; IV Piggyback:400] Out: 3195 [Urine:2670; Drains:155; Chest Tube:370] Intake/Output this shift: Total I/O In: 60.7 [I.V.:60.7] Out: 140 [Urine:130; Chest Tube:10]  General appearance: alert and cooperative Heart: irregularly irregular rhythm Lungs: diminished breath sounds bibasilar Abdomen: soft, non-tender; bowel sounds normal; no masses,  no organomegaly Extremities: edema mild Wound: Aquacel in place  Lab Results:  Recent Labs  11/15/15 0630  11/15/15 1651 11/16/15 0408  WBC 10.9*  --   --  11.6*  HGB 7.8*  < > 8.8* 8.8*  HCT 22.6*  < > 26.0* 26.4*  PLT 123*  --   --  143*  < > = values in this interval not displayed. BMET:  Recent Labs  11/14/15 2100  11/15/15 1651 11/16/15 0408  NA 134*  < > 135 134*  K 4.7  < > 4.0 3.5  CL 105  < > 97* 102  CO2 23  --   --  26  GLUCOSE 112*  < > 184* 128*  BUN 13  < > 14 12  CREATININE 1.43*  < > 1.40* 1.44*  CALCIUM 7.6*  --   --  8.1*  < > = values in this interval not displayed.  PT/INR:  Recent Labs  11/14/15 1600  LABPROT 21.2*  INR 1.81   ABG    Component Value Date/Time   PHART 7.352  11/15/2015 0340   HCO3 21.4 11/15/2015 0340   TCO2 25 11/15/2015 1651   ACIDBASEDEF 4.0 (H) 11/15/2015 0340   O2SAT 61.7 11/15/2015 0625   CBG (last 3)   Recent Labs  11/16/15 0053 11/16/15 0404 11/16/15 0856  GLUCAP 144* 137* 139*    Assessment/Plan: S/P Procedure(s) (LRB): CORONARY ARTERY BYPASS GRAFTING (CABG) x 4 (N/A) TRANSESOPHAGEAL ECHOCARDIOGRAM (TEE) (N/A) LEFT ENDARTERECTOMY CAROTID (Left) RIGHT LEG GREATER SAPHENOUS VEIN HARVEST (Right)  He is hemodynamically stable.  Postop atrial fibrillation: he was in atrial fib at 110 this am on oral amio and just converted to sinus 80. Will give a bolus of amio and increase dose to 400 bid. Continue low dose lopressor.  Expected postop acute blood loss anemia: observe.  Preop biliary sepsis: continues on antibiotic with biliary drain. No signs of sepsis at this time and eating some.  DM: preop Hgb A1c was 7.1. Continue Levemir and SSI.  Volume excess: diuresing. Creat up slightly today. Will repeat tomorrow.  Continue IS, ambulation.     LOS: 9 days    Gaye Pollack 11/16/2015

## 2015-11-16 NOTE — Progress Notes (Signed)
Patient ID: Brian Casey, male   DOB: [birth year], 65 y.o.   MRN: [id number]  SICU Evening Rounds:  Hemodynamically stable today in sinus rhythm.  Diuresed well.  Ambulated.

## 2015-11-17 ENCOUNTER — Inpatient Hospital Stay (HOSPITAL_COMMUNITY): Payer: Managed Care, Other (non HMO)

## 2015-11-17 LAB — TYPE AND SCREEN
ABO/RH(D): A POS
Antibody Screen: NEGATIVE
Unit division: 0
Unit division: 0
Unit division: 0
Unit division: 0
Unit division: 0
Unit division: 0
Unit division: 0
Unit division: 0

## 2015-11-17 LAB — BASIC METABOLIC PANEL
ANION GAP: 3 — AB (ref 5–15)
BUN: 13 mg/dL (ref 6–20)
CHLORIDE: 102 mmol/L (ref 101–111)
CO2: 27 mmol/L (ref 22–32)
Calcium: 8 mg/dL — ABNORMAL LOW (ref 8.9–10.3)
Creatinine, Ser: 1.22 mg/dL (ref 0.61–1.24)
GFR calc Af Amer: >60 mL/min (ref >60)
GFR calc non Af Amer: >60 mL/min (ref >60)
GLUCOSE: 87 mg/dL (ref 65–99)
POTASSIUM: 3.7 mmol/L (ref 3.5–5.1)
Sodium: 132 mmol/L — ABNORMAL LOW (ref 135–145)

## 2015-11-17 LAB — GLUCOSE, CAPILLARY
GLUCOSE-CAPILLARY: 100 mg/dL — AB (ref 65–99)
GLUCOSE-CAPILLARY: 193 mg/dL — AB (ref 65–99)
Glucose-Capillary: 88 mg/dL (ref 65–99)
Glucose-Capillary: 139 mg/dL — ABNORMAL HIGH (ref 65–99)
Glucose-Capillary: 102 mg/dL — ABNORMAL HIGH (ref 65–99)

## 2015-11-17 MED ORDER — SODIUM CHLORIDE 0.9% FLUSH
3.0000 mL | Freq: Two times a day (BID) | INTRAVENOUS | Status: DC
  Administered 2015-11-18 – 2015-11-19 (×3): 3 mL via INTRAVENOUS

## 2015-11-17 MED ORDER — FAMOTIDINE 20 MG PO TABS
20.0000 mg | ORAL_TABLET | Freq: Two times a day (BID) | ORAL | Status: DC
  Administered 2015-11-17 – 2015-11-21 (×8): 20 mg via ORAL
  Filled 2015-11-17 (×8): qty 1

## 2015-11-17 MED ORDER — FENTANYL CITRATE (PF) 100 MCG/2ML IJ SOLN
INTRAMUSCULAR | Status: AC
  Filled 2015-11-17: qty 2

## 2015-11-17 MED ORDER — OXYCODONE HCL 5 MG PO TABS: 5.0000 mg | ORAL_TABLET | ORAL | Status: DC | PRN

## 2015-11-17 MED ORDER — BISACODYL 10 MG RE SUPP: 10.0000 mg | Freq: Every day | RECTAL | Status: DC | PRN

## 2015-11-17 MED ORDER — POTASSIUM CHLORIDE CRYS ER 20 MEQ PO TBCR
40.0000 meq | EXTENDED_RELEASE_TABLET | Freq: Every day | ORAL | Status: DC
  Administered 2015-11-18 – 2015-11-21 (×4): 40 meq via ORAL
  Filled 2015-11-17 (×4): qty 2

## 2015-11-17 MED ORDER — TRAMADOL HCL 50 MG PO TABS: 50.0000 mg | ORAL_TABLET | ORAL | Status: DC | PRN

## 2015-11-17 MED ORDER — BISACODYL 5 MG PO TBEC: 10.0000 mg | DELAYED_RELEASE_TABLET | Freq: Every day | ORAL | Status: DC | PRN

## 2015-11-17 MED ORDER — ASPIRIN EC 325 MG PO TBEC
325.0000 mg | DELAYED_RELEASE_TABLET | Freq: Every day | ORAL | Status: DC
  Administered 2015-11-18 – 2015-11-21 (×4): 325 mg via ORAL
  Filled 2015-11-17 (×4): qty 1

## 2015-11-17 MED ORDER — FUROSEMIDE 40 MG PO TABS
40.0000 mg | ORAL_TABLET | Freq: Every day | ORAL | Status: DC
  Administered 2015-11-18 – 2015-11-21 (×4): 40 mg via ORAL
  Filled 2015-11-17 (×4): qty 1

## 2015-11-17 MED ORDER — INSULIN DETEMIR 100 UNIT/ML ~~LOC~~ SOLN
10.0000 [IU] | Freq: Every day | SUBCUTANEOUS | Status: DC
  Administered 2015-11-18 – 2015-11-21 (×4): 10 [IU] via SUBCUTANEOUS
  Filled 2015-11-17 (×4): qty 0.1

## 2015-11-17 MED ORDER — ONDANSETRON HCL 4 MG PO TABS: 4.0000 mg | ORAL_TABLET | Freq: Four times a day (QID) | ORAL | Status: DC | PRN

## 2015-11-17 MED ORDER — MAGIC MOUTHWASH
10.0000 mL | Freq: Three times a day (TID) | ORAL | Status: DC
  Administered 2015-11-17 – 2015-11-21 (×13): 10 mL via ORAL
  Filled 2015-11-17 (×14): qty 10

## 2015-11-17 MED ORDER — SODIUM CHLORIDE 0.9 % IV SOLN
250.0000 mL | INTRAVENOUS | Status: DC | PRN
  Administered 2015-11-18: 250 mL via INTRAVENOUS

## 2015-11-17 MED ORDER — ACETAMINOPHEN 325 MG PO TABS: 650.0000 mg | ORAL_TABLET | Freq: Four times a day (QID) | ORAL | Status: DC | PRN

## 2015-11-17 MED ORDER — METOPROLOL TARTRATE 25 MG PO TABS
25.0000 mg | ORAL_TABLET | Freq: Two times a day (BID) | ORAL | Status: DC
  Administered 2015-11-17 – 2015-11-21 (×7): 25 mg via ORAL
  Filled 2015-11-17 (×8): qty 1

## 2015-11-17 MED ORDER — ONDANSETRON HCL 4 MG/2ML IJ SOLN: 4.0000 mg | Freq: Four times a day (QID) | INTRAMUSCULAR | Status: DC | PRN

## 2015-11-17 MED ORDER — POTASSIUM CHLORIDE 10 MEQ/50ML IV SOLN
10.0000 meq | INTRAVENOUS | Status: DC | PRN
  Administered 2015-11-17: 10 meq via INTRAVENOUS
  Filled 2015-11-17 (×4): qty 50

## 2015-11-17 MED ORDER — INSULIN ASPART 100 UNIT/ML ~~LOC~~ SOLN
0.0000 [IU] | Freq: Three times a day (TID) | SUBCUTANEOUS | Status: DC
  Administered 2015-11-18: 2 [IU] via SUBCUTANEOUS
  Administered 2015-11-18: 4 [IU] via SUBCUTANEOUS
  Administered 2015-11-19: 2 [IU] via SUBCUTANEOUS
  Administered 2015-11-19 – 2015-11-20 (×2): 4 [IU] via SUBCUTANEOUS
  Administered 2015-11-20: 2 [IU] via SUBCUTANEOUS

## 2015-11-17 MED ORDER — DOCUSATE SODIUM 100 MG PO CAPS
200.0000 mg | ORAL_CAPSULE | Freq: Every day | ORAL | Status: DC
  Administered 2015-11-17 – 2015-11-21 (×5): 200 mg via ORAL
  Filled 2015-11-17 (×6): qty 2

## 2015-11-17 MED ORDER — MOVING RIGHT ALONG BOOK
Freq: Once | Status: AC
  Administered 2015-11-17: 20:00:00
  Filled 2015-11-17: qty 1

## 2015-11-17 MED ORDER — SODIUM CHLORIDE 0.9% FLUSH
3.0000 mL | INTRAVENOUS | Status: DC | PRN
  Administered 2015-11-19 (×2): 3 mL via INTRAVENOUS
  Filled 2015-11-17 (×2): qty 3

## 2015-11-17 NOTE — Progress Notes (Signed)
Subjective  - POD #3  No complaints today Ambulating BID and sitting in a chair   Physical Exam:  Left CEA incision intact and soft Slight hypoglossal neuropraxia Smile symmetric Neuro intact       Assessment/Plan:  POD #3  Stable following L CEA/CABG Neuro intact with mild hypoglossal neuropraxia, which should resolve with time Dr. Donzetta Matters to see tomorrow  Brian Casey 11/17/2015 5:32 PM --  Vitals:   11/17/15 1500 11/17/15 1547  BP:    Pulse: 73   Resp: (!) 22   Temp:  98.5 F (36.9 C)    Intake/Output Summary (Last 24 hours) at 11/17/15 1732 Last data filed at 11/17/15 1500  Gross per 24 hour  Intake              998 ml  Output             3280 ml  Net            -2282 ml     Laboratory CBC    Component Value Date/Time   WBC 11.6 (H) 11/16/2015 0408   HGB 8.8 (L) 11/16/2015 0408   HCT 26.4 (L) 11/16/2015 0408   PLT 143 (L) 11/16/2015 0408    BMET    Component Value Date/Time   NA 132 (L) 11/17/2015 0415   K 3.7 11/17/2015 0415   CL 102 11/17/2015 0415   CO2 27 11/17/2015 0415   GLUCOSE 87 11/17/2015 0415   BUN 13 11/17/2015 0415   CREATININE 1.22 11/17/2015 0415   CALCIUM 8.0 (L) 11/17/2015 0415   GFRNONAA >60 11/17/2015 0415   GFRAA >60 11/17/2015 0415    COAG Lab Results  Component Value Date   INR 1.81 11/14/2015   INR 1.19 11/12/2015   INR 1.15 11/08/2015   No results found for: PTT  Antibiotics Anti-infectives    Start     Dose/Rate Route Frequency Ordered Stop   11/15/15 0800  vancomycin (VANCOCIN) IVPB 1000 mg/200 mL premix     1,000 mg 200 mL/hr over 60 Minutes Intravenous Every 24 hours 11/14/15 1606 11/17/15 0949   11/14/15 1700  piperacillin-tazobactam (ZOSYN) IVPB 3.375 g     3.375 g 12.5 mL/hr over 240 Minutes Intravenous Every 8 hours 11/14/15 1606     11/14/15 0530  piperacillin-tazobactam (ZOSYN) IVPB 3.375 g  Status:  Discontinued     3.375 g 12.5 mL/hr over 240 Minutes Intravenous Every 8 hours  11/13/15 1308 11/14/15 1601   11/14/15 0400  vancomycin (VANCOCIN) 1,500 mg in sodium chloride 0.9 % 250 mL IVPB     1,500 mg 125 mL/hr over 120 Minutes Intravenous To Surgery 11/13/15 1425 11/14/15 1000   11/14/15 0400  levofloxacin (LEVAQUIN) IVPB 500 mg     500 mg 100 mL/hr over 60 Minutes Intravenous To Surgery 11/13/15 1902 11/14/15 0830   11/13/15 1830  piperacillin-tazobactam (ZOSYN) IVPB 3.375 g  Status:  Discontinued     3.375 g 12.5 mL/hr over 240 Minutes Intravenous Every 8 hours 11/13/15 1308 11/13/15 1309   11/10/15 1400  vancomycin (VANCOCIN) 1,250 mg in sodium chloride 0.9 % 250 mL IVPB  Status:  Discontinued     1,250 mg 166.7 mL/hr over 90 Minutes Intravenous Every 12 hours 11/10/15 1131 11/10/15 1447   11/09/15 1400  vancomycin (VANCOCIN) 1,250 mg in sodium chloride 0.9 % 250 mL IVPB  Status:  Discontinued     1,250 mg 166.7 mL/hr over 90 Minutes Intravenous Every 12 hours 11/09/15 1323  11/10/15 1131   11/08/15 0230  piperacillin-tazobactam (ZOSYN) IVPB 3.375 g  Status:  Discontinued     3.375 g 12.5 mL/hr over 240 Minutes Intravenous Every 8 hours 11/07/15 2205 11/13/15 1308   11/07/15 1630  piperacillin-tazobactam (ZOSYN) IVPB 3.375 g  Status:  Discontinued     3.375 g 12.5 mL/hr over 240 Minutes Intravenous Every 8 hours 11/07/15 1553 11/07/15 2205       V. Leia Alf, M.D. [facility] Office: [phone] Pager:  [phone]

## 2015-11-17 NOTE — Progress Notes (Signed)
3 Days Post-Op Procedure(s) (LRB): CORONARY ARTERY BYPASS GRAFTING (CABG) x 4 (N/A) TRANSESOPHAGEAL ECHOCARDIOGRAM (TEE) (N/A) LEFT ENDARTERECTOMY CAROTID (Left) RIGHT LEG GREATER SAPHENOUS VEIN HARVEST (Right) Subjective:  No complaints. Ambulating well. Not much appetite yet. Passing flatus and thinks he is close to having a BM.  Objective: Vital signs in last 24 hours: Temp:  [98.2 F (36.8 C)-98.5 F (36.9 C)] 98.4 F (36.9 C) (08/27 0829) Pulse Rate:  [25-120] 70 (08/27 0600) Cardiac Rhythm: Normal sinus rhythm (08/27 0400) Resp:  [11-22] 14 (08/27 0600) BP: (103-152)/(57-75) 147/65 (08/27 0600) SpO2:  [89 %-97 %] 94 % (08/27 0600) Weight:  [97.1 kg (214 lb 1.1 oz)] 97.1 kg (214 lb 1.1 oz) (08/27 0500)  Hemodynamic parameters for last 24 hours:    Intake/Output from previous day: 08/26 0701 - 08/27 0700 In: 612.2 [I.V.:507.2; IV Piggyback:100] Out: 1850 [TFTDD:2202; Drains:165; Chest Tube:10] Intake/Output this shift: No intake/output data recorded.  General appearance: alert and cooperative Neurologic: intact Heart: regular rate and rhythm, S1, S2 normal, no murmur, click, rub or gallop Lungs: clear to auscultation bilaterally Abdomen: soft, non-tender; bowel sounds normal; no masses,  no organomegaly Extremities: edema mild Wound: incisions ok  Lab Results:  Recent Labs  11/15/15 0630  11/15/15 1651 11/16/15 0408  WBC 10.9*  --   --  11.6*  HGB 7.8*  < > 8.8* 8.8*  HCT 22.6*  < > 26.0* 26.4*  PLT 123*  --   --  143*  < > = values in this interval not displayed. BMET:  Recent Labs  11/16/15 0408 11/17/15 0415  NA 134* 132*  K 3.5 3.7  CL 102 102  CO2 26 27  GLUCOSE 128* 87  BUN 12 13  CREATININE 1.44* 1.22  CALCIUM 8.1* 8.0*    PT/INR:  Recent Labs  11/14/15 1600  LABPROT 21.2*  INR 1.81   ABG    Component Value Date/Time   PHART 7.352 11/15/2015 0340   HCO3 21.4 11/15/2015 0340   TCO2 25 11/15/2015 1651   ACIDBASEDEF 4.0 (H)  11/15/2015 0340   O2SAT 61.7 11/15/2015 0625   CBG (last 3)   Recent Labs  11/16/15 2332 11/17/15 0334 11/17/15 0825  GLUCAP 102* 88 100*   CLINICAL DATA:  CABG  EXAM: PORTABLE CHEST 1 VIEW  COMPARISON:  None.  FINDINGS: Removal of LEFT chest tube. No pneumothorax. LEFT basilar atelectasis small effusion remains. RIGHT lung is clear. RIGHT PICC line unchanged.  IMPRESSION: Removal LEFT chest tube without complication. Mild LEFT basilar atelectasis small effusion   Electronically Signed   By: Suzy Bouchard M.D.   On: 11/17/2015 07:23  Assessment/Plan: S/P Procedure(s) (LRB): CORONARY ARTERY BYPASS GRAFTING (CABG) x 4 (N/A) TRANSESOPHAGEAL ECHOCARDIOGRAM (TEE) (N/A) LEFT ENDARTERECTOMY CAROTID (Left) RIGHT LEG GREATER SAPHENOUS VEIN HARVEST (Right)  He is hemodynamically stable in sinus rhythm  Diuresed well yesterday and weight down 1.5 lbs but still 7.5 lbs over preop. Continue diuretic and KCL.  Biliary sepsis with E. Coli acalculous cholecystitis. Afebrile on Zosyn. Biliary drain working well.  DM: glucose under good control.   Will transfer to 2W and continue mobilization.   LOS: 10 days    Gaye Pollack 11/17/2015

## 2015-11-18 ENCOUNTER — Other Ambulatory Visit: Payer: Self-pay | Admitting: *Deleted

## 2015-11-18 ENCOUNTER — Encounter (HOSPITAL_COMMUNITY): Payer: Self-pay

## 2015-11-18 DIAGNOSIS — I6523 Occlusion and stenosis of bilateral carotid arteries: Secondary | ICD-10-CM

## 2015-11-18 LAB — BASIC METABOLIC PANEL
ANION GAP: 7 (ref 5–15)
BUN: 15 mg/dL (ref 6–20)
CALCIUM: 8.2 mg/dL — AB (ref 8.9–10.3)
CO2: 28 mmol/L (ref 22–32)
CREATININE: 1.13 mg/dL (ref 0.61–1.24)
Chloride: 100 mmol/L — ABNORMAL LOW (ref 101–111)
Glucose, Bld: 95 mg/dL (ref 65–99)
Potassium: 3.6 mmol/L (ref 3.5–5.1)
SODIUM: 135 mmol/L (ref 135–145)

## 2015-11-18 LAB — CBC
HCT: 27.7 % — ABNORMAL LOW (ref 39.0–52.0)
HEMOGLOBIN: 9.1 g/dL — AB (ref 13.0–17.0)
MCH: 30.6 pg (ref 26.0–34.0)
MCHC: 32.9 g/dL (ref 30.0–36.0)
MCV: 93.3 fL (ref 78.0–100.0)
Platelets: 240 10*3/uL (ref 150–400)
RBC: 2.97 MIL/uL — ABNORMAL LOW (ref 4.22–5.81)
RDW: 15.9 % — AB (ref 11.5–15.5)
WBC: 10.2 10*3/uL (ref 4.0–10.5)

## 2015-11-18 LAB — GLUCOSE, CAPILLARY
GLUCOSE-CAPILLARY: 175 mg/dL — AB (ref 65–99)
GLUCOSE-CAPILLARY: 96 mg/dL (ref 65–99)
GLUCOSE-CAPILLARY: 115 mg/dL — AB (ref 65–99)
Glucose-Capillary: 174 mg/dL — ABNORMAL HIGH (ref 65–99)
Glucose-Capillary: 143 mg/dL — ABNORMAL HIGH (ref 65–99)

## 2015-11-18 MED ORDER — POTASSIUM CHLORIDE CRYS ER 20 MEQ PO TBCR
40.0000 meq | EXTENDED_RELEASE_TABLET | Freq: Once | ORAL | Status: AC
  Administered 2015-11-18: 40 meq via ORAL
  Filled 2015-11-18: qty 2

## 2015-11-18 MED ORDER — GUAIFENESIN ER 600 MG PO TB12
600.0000 mg | ORAL_TABLET | Freq: Two times a day (BID) | ORAL | Status: DC
  Administered 2015-11-18 – 2015-11-21 (×7): 600 mg via ORAL
  Filled 2015-11-18 (×7): qty 1

## 2015-11-18 MED ORDER — SODIUM CHLORIDE 0.9% FLUSH
10.0000 mL | INTRAVENOUS | Status: DC | PRN
  Administered 2015-11-18 – 2015-11-21 (×5): 10 mL
  Filled 2015-11-18 (×4): qty 40

## 2015-11-18 MED ORDER — HYDROCOD POLST-CPM POLST ER 10-8 MG/5ML PO SUER
5.0000 mL | Freq: Every day | ORAL | Status: DC
  Administered 2015-11-18 – 2015-11-20 (×3): 5 mL via ORAL
  Filled 2015-11-18 (×3): qty 5

## 2015-11-18 NOTE — Anesthesia Postprocedure Evaluation (Signed)
Anesthesia Post Note  Patient: Brian Casey  Procedure(s) Performed: Procedure(s) (LRB): CORONARY ARTERY BYPASS GRAFTING (CABG) x 4 (N/A) TRANSESOPHAGEAL ECHOCARDIOGRAM (TEE) (N/A) LEFT ENDARTERECTOMY CAROTID (Left) RIGHT LEG GREATER SAPHENOUS VEIN HARVEST (Right)  Patient location during evaluation: ICU Anesthesia Type: General Level of consciousness: sedated and patient remains intubated per anesthesia plan Pain management: pain level controlled Vital Signs Assessment: post-procedure vital signs reviewed and stable Respiratory status: patient on ventilator - see flowsheet for VS and patient remains intubated per anesthesia plan Cardiovascular status: stable Postop Assessment: no headache Anesthetic complications: no    Last Vitals:  Vitals:   11/17/15 2047 11/18/15 0508  BP:  104/62  Pulse: 74 72  Resp: 18 20  Temp:  36.4 C    Last Pain:  Vitals:   11/18/15 0508  TempSrc: Oral  PainSc:                  [place] S

## 2015-11-18 NOTE — Progress Notes (Signed)
  Progress Note    11/18/2015 9:13 AM 4 Days Post-Op  Subjective:  No complaints this a.m.  Vitals:   11/18/15 0508 11/18/15 0811  BP: 104/62 (!) 146/46  Pulse: 72 (!) 45  Resp: 20   Temp: 97.6 F (36.4 C)     Physical Exam: Left neck with mild edema Tongue deviates to left Upper and lower extremities 5/5 strength  CBC    Component Value Date/Time   WBC 10.2 11/18/2015 0424   RBC 2.97 (L) 11/18/2015 0424   HGB 9.1 (L) 11/18/2015 0424   HCT 27.7 (L) 11/18/2015 0424   PLT 240 11/18/2015 0424   MCV 93.3 11/18/2015 0424   MCH 30.6 11/18/2015 0424   MCHC 32.9 11/18/2015 0424   RDW 15.9 (H) 11/18/2015 0424   LYMPHSABS 0.4 (L) 11/09/2015 0450   MONOABS 0.7 11/09/2015 0450   EOSABS 0.1 11/09/2015 0450   BASOSABS 0.0 11/09/2015 0450    BMET    Component Value Date/Time   NA 135 11/18/2015 0424   K 3.6 11/18/2015 0424   CL 100 (L) 11/18/2015 0424   CO2 28 11/18/2015 0424   GLUCOSE 95 11/18/2015 0424   BUN 15 11/18/2015 0424   CREATININE 1.13 11/18/2015 0424   CALCIUM 8.2 (L) 11/18/2015 0424   GFRNONAA >60 11/18/2015 0424   GFRAA >60 11/18/2015 0424    INR    Component Value Date/Time   INR 1.81 11/14/2015 1600     Intake/Output Summary (Last 24 hours) at 11/18/15 0913 Last data filed at 11/18/15 0117  Gross per 24 hour  Intake           725.83 ml  Output             2070 ml  Net         -1344.17 ml     Assessment:  65 y.o. male is s/p left cea with concomitant cabg. Mild neuropraxia of left hypoglossal  Plan: -will follow while inpatient -neuropraxia should resolve with time -on aspirin -at d/c can f/u in 4 weeks with duplex   Krisalyn Yankowski C. Donzetta Matters, MD [facility] Office: [phone] Pager: [phone]  11/18/2015 9:13 AM

## 2015-11-18 NOTE — Progress Notes (Signed)
    Subjective:  Feeling better. No chest pain or shortness of breath.   Objective:  Vital Signs in the last 24 hours: Temp:  [97.6 F (36.4 C)-98.6 F (37 C)] 97.6 F (36.4 C) (08/28 0508) Pulse Rate:  [45-84] 45 (08/28 0811) Resp:  [13-22] 20 (08/28 0508) BP: (104-146)/(46-66) 146/46 (08/28 0811) SpO2:  [94 %-96 %] 95 % (08/28 0508) Weight:  [95.8 kg (211 lb 3.2 oz)] 95.8 kg (211 lb 3.2 oz) (08/28 0508)  Intake/Output from previous day: 08/27 0701 - 08/28 0700 In: 1218.8 [P.O.:694; I.V.:224.8; IV Piggyback:300] Out: 2070 [Urine:2000; Drains:70]  Physical Exam: Pt is alert and oriented, NAD HEENT: normal Neck: JVP - normal, carotid endarterectomy site healing well Lungs: CTA bilaterally CV: irregular, distant without murmur or gallop Abd: soft, NT Ext: no edema Skin: warm/dry no rash   Lab Results:  Recent Labs  11/16/15 0408 11/18/15 0424  WBC 11.6* 10.2  HGB 8.8* 9.1*  PLT 143* 240    Recent Labs  11/17/15 0415 11/18/15 0424  NA 132* 135  K 3.7 3.6  CL 102 100*  CO2 27 28  GLUCOSE 87 95  BUN 13 15  CREATININE 1.22 1.13   No results for input(s): TROPONINI in the last 72 hours.  Invalid input(s): CK, MB  Cardiac Studies: 2D Echo: Study Conclusions  - Left ventricle: The cavity size was normal. Systolic function was   normal. The estimated ejection fraction was in the range of 50%   to 55%. Wall motion was normal; there were no regional wall   motion abnormalities. Features are consistent with a pseudonormal   left ventricular filling pattern, with concomitant abnormal   relaxation and increased filling pressure (grade 2 diastolic   dysfunction). - Left atrium: The atrium was at the upper limits of normal in   size. - Right ventricle: Systolic function was normal. - Pulmonary arteries: Systolic pressure was within the normal   range.  Impressions:  - Rhythm is normal sinus  Tele: Atrial fibrillation, HR 90 bpm  Assessment/Plan:    1. NSTEMI: s/p multivessel CABG< progressing well 2. Post-operative atrial fibrillation: on metoprolol and amiodarone. At some point need to make decision on anticoagulation but lots of issue (cholecystitis, biliary drain, post-op, etc). Continue ASA and current Rx for now. 3. Carotid stenosis s/p CEA: vascular surgery following, doing well. 4. Post-op anemia - mild, stable 5. Type II DM: management per surgical team  Sherren Mocha, M.D. 11/18/2015, 12:24 PM

## 2015-11-18 NOTE — Progress Notes (Signed)
CARDIAC REHAB PHASE I   PRE:  Rate/Rhythm: 80 a fib  BP:  Sitting: 137/53        SaO2: 96 RA  MODE:  Ambulation: 350 ft   POST:  Rate/Rhythm: 126 a fib  BP:  Sitting: 152/61         SaO2: 100 RA  Pt ambulated 350 ft on RA, IV, rolling walker, assist x1, slow, steady gait, tolerated fairly well. Pt HR elevated 120s, c/o mild DOE, declined rest stop. Sats good on RA. Encouraged IS, additional ambulation x2 today. Pt to recliner after walk, feet elevated, call bell within reach. Will follow.   [id number] Lenna Sciara, RN, BSN 11/18/2015 9:16 AM

## 2015-11-18 NOTE — Evaluation (Signed)
Clinical/Bedside Swallow Evaluation Patient Details  Name: Brian Casey MRN: [id number] Date of Birth: [birth year]  Today's Date: 11/18/2015 Time: SLP Start Time (ACUTE ONLY): 1520 SLP Stop Time (ACUTE ONLY): 1547 SLP Time Calculation (min) (ACUTE ONLY): 27 min  Past Medical History:  Past Medical History:  Diagnosis Date  . Acute renal failure ([place]) 11/06/2015   [name] 11/06/2015  . Anxiety   . Arthritis    "lower spine; fingers" (11/07/2015)  . Depression   . GERD (gastroesophageal reflux disease)   . High cholesterol   . Hypertension 10/17/2013  . Hyperthyroidism    "had it radiated in his '44s"  . Hypothyroidism   . New onset atrial fibrillation ([place]) 11/06/2015   [name] 11/07/2015  . NSTEMI (non-ST elevated myocardial infarction) ([place]) 11/07/2015  . Thyroid disease   . Type II diabetes mellitus ([place])    Past Surgical History:  Past Surgical History:  Procedure Laterality Date  . CARDIAC CATHETERIZATION Right 11/06/2015   Procedure: Left Heart Cath and Coronary Angiography;  Surgeon: Minna Merritts, MD;  Location: [facility];  Service: Cardiovascular;  Laterality: Right;  . COLONOSCOPY    . CORONARY ARTERY BYPASS GRAFT N/A 11/14/2015   Procedure: CORONARY ARTERY BYPASS GRAFTING (CABG) x 4;  Surgeon: Ivin Poot, MD;  Location: [facility];  Service: Open Heart Surgery;  Laterality: N/A;  . ENDARTERECTOMY Left 11/14/2015   Procedure: LEFT ENDARTERECTOMY CAROTID;  Surgeon: Waynetta Sandy, MD;  Location: [facility];  Service: Vascular;  Laterality: Left;  . FINGER GANGLION CYST EXCISION Left X 3   "index finger X 2; thumb X 1"  . [place]  . HERNIA REPAIR    . INSERTION OF MESH N/A 11/16/2013   Procedure: INSERTION OF MESH;  Surgeon: Imogene Burn. Georgette Dover, MD;  Location: [facility];  Service: General;  Laterality: N/A;  . IR GENERIC HISTORICAL  11/08/2015   IR PERC CHOLECYSTOSTOMY 11/08/2015 Corrie Mckusick, DO [facility]  . TEE WITHOUT CARDIOVERSION N/A  11/14/2015   Procedure: TRANSESOPHAGEAL ECHOCARDIOGRAM (TEE);  Surgeon: Ivin Poot, MD;  Location: [facility];  Service: Open Heart Surgery;  Laterality: N/A;  . UMBILICAL HERNIA REPAIR N/A 11/16/2013   Procedure: UMBILICAL HERNIA REPAIR;  Surgeon: Imogene Burn. Georgette Dover, MD;  Location: [facility];  Service: General;  Laterality: N/A;  . VEIN HARVEST Right 11/14/2015   Procedure: RIGHT LEG GREATER SAPHENOUS VEIN HARVEST;  Surgeon: Ivin Poot, MD;  Location: [facility];  Service: Open Heart Surgery;  Laterality: Right;   HPI:  65 y.o. male is s/p left cea with concomitant cabg. Mild neuropraxia of left hypoglossal   Assessment / Plan / Recommendation Clinical Impression  Pt referred for clinical assessment of swallow function due to hypoglossal neurapraxia following cardiac surgery. Pt presents with limited L lingual ROM and decreased L facial sensation. He reports frequent coughing with PO intake. Pt's spouse describes an episode earlier this date when the pt was unable to coordinate sucking from a straw. Pt was observed with ice chips, thin (via spoon, cup and straw), puree and dry solid. Pt demonstrated multiple swallows with thin and prolonged mastication with dry solid , but no overt s/s aspiration. Pt required mod verbal cueing to comply with swallow precautions of single small sips, small bites. Discussed removing distractions during meal times as the pt frequently looked up to the television and attempted to speak with PO in his mouth. Recommend: Continue with regular diet with thins and adherence to swallow precautions  to reduce the risk of aspiration. No straws (to limit bolus size and speed of delivery). Reduce distractions. May consider trialing meds whole with applesauce. Will follow.     Aspiration Risk  Mild aspiration risk    Diet Recommendation Regular;Thin liquid   Liquid Administration via: Cup;No straw Medication Administration: Whole meds with liquid (or trial whole with  puree) Supervision: Patient able to self feed;Intermittent supervision to cue for compensatory strategies Compensations: Slow rate;Small sips/bites;Minimize environmental distractions Postural Changes: Seated upright at 90 degrees    Other  Recommendations     Follow up Recommendations  24 hour supervision/assistance (pending progress)    Frequency and Duration min 2x/week  2 weeks       Prognosis Prognosis for Safe Diet Advancement: Good      Swallow Study   General Date of Onset: 11/15/15 HPI: 65 y.o. male is s/p left cea with concomitant cabg. Mild neuropraxia of left hypoglossal Type of Study: Bedside Swallow Evaluation Previous Swallow Assessment: none known Diet Prior to this Study: Regular;Thin liquids Temperature Spikes Noted: No Respiratory Status: Room air History of Recent Intubation:  (for surgery) Behavior/Cognition: Alert;Cooperative;Confused (mild confusion vs odd affect) Oral Cavity Assessment: Within Functional Limits Oral Care Completed by SLP: No Oral Cavity - Dentition: Adequate natural dentition Vision: Functional for self-feeding Self-Feeding Abilities: Able to feed self Patient Positioning:  (EOB) Baseline Vocal Quality: Normal Volitional Cough: Strong Volitional Swallow: Able to elicit    Oral/Motor/Sensory Function Overall Oral Motor/Sensory Function: Mild impairment Facial ROM: Within Functional Limits Facial Symmetry: Within Functional Limits Facial Strength: Within Functional Limits Facial Sensation: Reduced left Lingual ROM: Reduced left;Suspected CN XII (hypoglossal) dysfunction Lingual Symmetry: Abnormal symmetry left Lingual Strength: Reduced;Suspected CN XII (hypoglossal) dysfunction Mandible: Within Functional Limits   Ice Chips Ice chips: Within functional limits Presentation: Self Fed;Spoon   Thin Liquid Thin Liquid: Impaired Presentation: Cup;Spoon;Straw;Self Fed Pharyngeal  Phase Impairments: Multiple swallows    Nectar Thick  Nectar Thick Liquid: Not tested   Honey Thick Honey Thick Liquid: Not tested   Puree Puree: Within functional limits   Solid   GO   Solid: Impaired Presentation: Self Fed Oral Phase Functional Implications: Impaired mastication (prolonged mastication, but functional)        Vinetta Bergamo MA, CCC-SLP Pager [phone] 11/18/2015,4:18 PM

## 2015-11-18 NOTE — Progress Notes (Addendum)
[address]       [place] [address]             [phone]      4 Days Post-Op Procedure(s) (LRB): CORONARY ARTERY BYPASS GRAFTING (CABG) x 4 (N/A) TRANSESOPHAGEAL ECHOCARDIOGRAM (TEE) (N/A) LEFT ENDARTERECTOMY CAROTID (Left) RIGHT LEG GREATER SAPHENOUS VEIN HARVEST (Right) Subjective: Looks and feels well, slowly getting stronger. + BM, no nausea  Objective: Vital signs in last 24 hours: Temp:  [97.6 F (36.4 C)-98.6 F (37 C)] 97.6 F (36.4 C) (08/28 0508) Pulse Rate:  [71-84] 72 (08/28 0508) Cardiac Rhythm: Other (Comment) (08/27 1919) Resp:  [13-22] 20 (08/28 0508) BP: (104-152)/(58-74) 104/62 (08/28 0508) SpO2:  [94 %-97 %] 95 % (08/28 0508) Weight:  [211 lb 3.2 oz (95.8 kg)] 211 lb 3.2 oz (95.8 kg) (08/28 0508)  Hemodynamic parameters for last 24 hours:    Intake/Output from previous day: 08/27 0701 - 08/28 0700 In: 1218.8 [P.O.:694; I.V.:224.8; IV Piggyback:300] Out: 2070 [Urine:2000; Drains:70] Intake/Output this shift: No intake/output data recorded.  General appearance: alert, cooperative and no distress Heart: irregularly irregular rhythm Lungs: mildly dim in the bases Abdomen: soft, nontender, drainage in bag Extremities: + LE edema Wound: incis healing well Neuro- intact Lab Results:  Recent Labs  11/16/15 0408 11/18/15 0424  WBC 11.6* 10.2  HGB 8.8* 9.1*  HCT 26.4* 27.7*  PLT 143* 240   BMET:  Recent Labs  11/17/15 0415 11/18/15 0424  NA 132* 135  K 3.7 3.6  CL 102 100*  CO2 27 28  GLUCOSE 87 95  BUN 13 15  CREATININE 1.22 1.13  CALCIUM 8.0* 8.2*    PT/INR: No results for input(s): LABPROT, INR in the last 72 hours. ABG    Component Value Date/Time   PHART 7.352 11/15/2015 0340   HCO3 21.4 11/15/2015 0340   TCO2 25 11/15/2015 1651   ACIDBASEDEF 4.0 (H) 11/15/2015 0340   O2SAT 61.7 11/15/2015 0625   CBG (last 3)   Recent Labs  11/17/15 1546 11/17/15 2045 11/18/15 0621  GLUCAP 139* 102* 96     Meds Scheduled Meds: . amiodarone  400 mg Oral BID  . aspirin EC  325 mg Oral Daily  . atorvastatin  40 mg Oral q1800  . chlorhexidine  15 mL Mouth Rinse BID  . docusate sodium  200 mg Oral Daily  . escitalopram  10 mg Oral Daily  . famotidine  20 mg Oral BID  . feeding supplement  1 Container Oral TID WC  . ferrous [id number] C-folic acid  1 capsule Oral BID WC  . furosemide  40 mg Oral Daily  . insulin aspart  0-24 Units Subcutaneous TID AC & HS  . insulin detemir  10 Units Subcutaneous Daily  . levothyroxine  100 mcg Oral QAC breakfast  . magic mouthwash  10 mL Oral TID  . mouth rinse  15 mL Mouth Rinse q12n4p  . metoprolol tartrate  25 mg Oral BID  . mometasone-formoterol  2 puff Inhalation BID  . piperacillin-tazobactam  3.375 g Intravenous Q8H  . potassium chloride  40 mEq Oral Daily  . sodium chloride flush  3 mL Intravenous Q12H   Continuous Infusions:  PRN Meds:.sodium chloride, acetaminophen, bisacodyl **OR** bisacodyl, levalbuterol, ondansetron **OR** ondansetron (ZOFRAN) IV, oxyCODONE, sodium chloride flush, sodium chloride flush, traMADol  Xrays Dg Chest Port 1 View  Result Date: 11/17/2015 CLINICAL DATA:  CABG EXAM: PORTABLE CHEST 1 VIEW COMPARISON:  None. FINDINGS: Removal of  LEFT chest tube. No pneumothorax. LEFT basilar atelectasis small effusion remains. RIGHT lung is clear. RIGHT PICC line unchanged. IMPRESSION: Removal LEFT chest tube without complication. Mild LEFT basilar atelectasis small effusion Electronically Signed   By: Suzy Bouchard M.D.   On: 11/17/2015 07:23    Assessment/Plan: S/P Procedure(s) (LRB): CORONARY ARTERY BYPASS GRAFTING (CABG) x 4 (N/A) TRANSESOPHAGEAL ECHOCARDIOGRAM (TEE) (N/A) LEFT ENDARTERECTOMY CAROTID (Left) RIGHT LEG GREATER SAPHENOUS VEIN HARVEST (Right)  1 Afib with CVR, hemodyn stable with BP variable 100's-150's- conts with po amiodarone. Give some K+to get>4.o 2 mod volume overload- cont diuresis 4 ABL  anemia is improved 5 Renal fxn is normal 6 check LFT's/Bili- GB drain in place- will need to be careful with hepatotoxic Rx's 7 conts current abx- ?duration 8 sugars well controlled 9 push routine pulm toilet/cardiac rehab  LOS: 11 days       GOLD,WAYNE E 11/18/2015   Cont iv Zosyn until bile cultures negative for E Coli Check bile today patient examined and medical record reviewed,agree with above note. Tharon Aquas Trigt III 11/18/2015

## 2015-11-19 ENCOUNTER — Encounter: Payer: Self-pay | Admitting: Vascular Surgery

## 2015-11-19 ENCOUNTER — Inpatient Hospital Stay (HOSPITAL_COMMUNITY): Payer: Managed Care, Other (non HMO)

## 2015-11-19 LAB — BASIC METABOLIC PANEL
Anion gap: 7 (ref 5–15)
BUN: 14 mg/dL (ref 6–20)
CALCIUM: 8.3 mg/dL — AB (ref 8.9–10.3)
CO2: 28 mmol/L (ref 22–32)
Chloride: 101 mmol/L (ref 101–111)
Creatinine, Ser: 1.18 mg/dL (ref 0.61–1.24)
GFR calc Af Amer: >60 mL/min (ref >60)
GLUCOSE: 105 mg/dL — AB (ref 65–99)
Potassium: 4 mmol/L (ref 3.5–5.1)
Sodium: 136 mmol/L (ref 135–145)

## 2015-11-19 LAB — CBC
HCT: 27.7 % — ABNORMAL LOW (ref 39.0–52.0)
Hemoglobin: 9.1 g/dL — ABNORMAL LOW (ref 13.0–17.0)
MCH: 30.4 pg (ref 26.0–34.0)
MCHC: 32.9 g/dL (ref 30.0–36.0)
MCV: 92.6 fL (ref 78.0–100.0)
Platelets: 252 10*3/uL (ref 150–400)
RBC: 2.99 MIL/uL — ABNORMAL LOW (ref 4.22–5.81)
RDW: 15.8 % — ABNORMAL HIGH (ref 11.5–15.5)
WBC: 10.2 10*3/uL (ref 4.0–10.5)

## 2015-11-19 LAB — GLUCOSE, CAPILLARY
GLUCOSE-CAPILLARY: 147 mg/dL — AB (ref 65–99)
Glucose-Capillary: 102 mg/dL — ABNORMAL HIGH (ref 65–99)
Glucose-Capillary: 169 mg/dL — ABNORMAL HIGH (ref 65–99)
Glucose-Capillary: 109 mg/dL — ABNORMAL HIGH (ref 65–99)

## 2015-11-19 LAB — HEPATIC FUNCTION PANEL
ALK PHOS: 95 U/L (ref 38–126)
ALT: 19 U/L (ref 17–63)
AST: 19 U/L (ref 15–41)
Albumin: 2.8 g/dL — ABNORMAL LOW (ref 3.5–5.0)
BILIRUBIN DIRECT: 0.3 mg/dL (ref 0.1–0.5)
BILIRUBIN INDIRECT: 0.9 mg/dL (ref 0.3–0.9)
BILIRUBIN TOTAL: 1.2 mg/dL (ref 0.3–1.2)
Total Protein: 5.6 g/dL — ABNORMAL LOW (ref 6.5–8.1)

## 2015-11-19 MED ORDER — LISINOPRIL 5 MG PO TABS
5.0000 mg | ORAL_TABLET | Freq: Every day | ORAL | Status: DC
  Administered 2015-11-19: 5 mg via ORAL
  Filled 2015-11-19: qty 1

## 2015-11-19 MED ORDER — AMIODARONE HCL 200 MG PO TABS
200.0000 mg | ORAL_TABLET | Freq: Every day | ORAL | Status: DC
  Administered 2015-11-19 – 2015-11-21 (×3): 200 mg via ORAL
  Filled 2015-11-19 (×3): qty 1

## 2015-11-19 NOTE — Progress Notes (Signed)
Order received to removed pacing wires.  Pacing wires removed without difficulty.  Left wire site had a very small amount of oozing blood after removal.  Gauze and tape placed.  Will continue to monitor.

## 2015-11-19 NOTE — Progress Notes (Addendum)
[facility]  Subjective  - Doing well over all.  Ambulating to bathroom and back.   Objective (!) 143/74 75 98.3 F (36.8 C) (Oral) 16 98%  Intake/Output Summary (Last 24 hours) at 11/19/15 0756 Last data filed at 11/19/15 0507  Gross per 24 hour  Intake              890 ml  Output             1350 ml  Net             -460 ml    Left neck incision clean and dry without hematoma Left tongue deviation Smile is symmetric    Assessment/Planning: POD # 5 Left CEA  Plan for follow up with Dr. Donzetta Matters in 4 weeks  Theda Sers [place] 11/19/2015 7:56 AM --  Laboratory Lab Results:  Recent Labs  11/18/15 0424 11/19/15 0514  WBC 10.2 10.2  HGB 9.1* 9.1*  HCT 27.7* 27.7*  PLT 240 252   BMET  Recent Labs  11/18/15 0424 11/19/15 0514  NA 135 136  K 3.6 4.0  CL 100* 101  CO2 28 28  GLUCOSE 95 105*  BUN 15 14  CREATININE 1.13 1.18  CALCIUM 8.2* 8.3*    COAG Lab Results  Component Value Date   INR 1.81 11/14/2015   INR 1.19 11/12/2015   INR 1.15 11/08/2015   No results found for: PTT   I have independently interviewed patient and agree with PA assessment and plan above. Progressing well, can f/u in 4 weeks after discharge.   Ragnar Waas C. Donzetta Matters, MD [facility] Office: [phone] Pager: [phone]

## 2015-11-19 NOTE — Progress Notes (Addendum)
[address]       [place] [address]             [phone]      5 Days Post-Op Procedure(s) (LRB): CORONARY ARTERY BYPASS GRAFTING (CABG) x 4 (N/A) TRANSESOPHAGEAL ECHOCARDIOGRAM (TEE) (N/A) LEFT ENDARTERECTOMY CAROTID (Left) RIGHT LEG GREATER SAPHENOUS VEIN HARVEST (Right) Subjective: Feels well, no new c/o , cough improved   Objective: Vital signs in last 24 hours: Temp:  [97.9 F (36.6 C)-98.4 F (36.9 C)] 98.3 F (36.8 C) (08/29 0440) Pulse Rate:  [45-92] 75 (08/29 0440) Cardiac Rhythm: Normal sinus rhythm (08/28 1900) Resp:  [16-20] 16 (08/29 0440) BP: (132-146)/(46-74) 143/74 (08/29 0440) SpO2:  [95 %-98 %] 98 % (08/29 0440) Weight:  [210 lb (95.3 kg)] 210 lb (95.3 kg) (08/29 0457)  Hemodynamic parameters for last 24 hours:    Intake/Output from previous day: 08/28 0701 - 08/29 0700 In: 890 [P.O.:840; IV Piggyback:50] Out: 1350 [Urine:1200; Drains:150] Intake/Output this shift: No intake/output data recorded.  General appearance: alert, cooperative and no distress Heart: regular rate and rhythm Lungs: some scattered crackles Abdomen: benign Extremities: + LE edema Wound: incis healing well  Lab Results:  Recent Labs  11/18/15 0424 11/19/15 0514  WBC 10.2 10.2  HGB 9.1* 9.1*  HCT 27.7* 27.7*  PLT 240 252   BMET:  Recent Labs  11/18/15 0424 11/19/15 0514  NA 135 136  K 3.6 4.0  CL 100* 101  CO2 28 28  GLUCOSE 95 105*  BUN 15 14  CREATININE 1.13 1.18  CALCIUM 8.2* 8.3*    PT/INR: No results for input(s): LABPROT, INR in the last 72 hours. ABG    Component Value Date/Time   PHART 7.352 11/15/2015 0340   HCO3 21.4 11/15/2015 0340   TCO2 25 11/15/2015 1651   ACIDBASEDEF 4.0 (H) 11/15/2015 0340   O2SAT 61.7 11/15/2015 0625   CBG (last 3)   Recent Labs  11/18/15 1620 11/18/15 2014 11/19/15 0620  GLUCAP 115* 143* 102*    Meds Scheduled Meds: . amiodarone  400 mg Oral BID  . aspirin EC  325 mg Oral  Daily  . atorvastatin  40 mg Oral q1800  . chlorhexidine  15 mL Mouth Rinse BID  . chlorpheniramine-HYDROcodone  5 mL Oral QHS  . docusate sodium  200 mg Oral Daily  . escitalopram  10 mg Oral Daily  . famotidine  20 mg Oral BID  . feeding supplement  1 Container Oral TID WC  . ferrous [id number] C-folic acid  1 capsule Oral BID WC  . furosemide  40 mg Oral Daily  . guaiFENesin  600 mg Oral BID  . insulin aspart  0-24 Units Subcutaneous TID AC & HS  . insulin detemir  10 Units Subcutaneous Daily  . levothyroxine  100 mcg Oral QAC breakfast  . magic mouthwash  10 mL Oral TID  . mouth rinse  15 mL Mouth Rinse q12n4p  . metoprolol tartrate  25 mg Oral BID  . mometasone-formoterol  2 puff Inhalation BID  . piperacillin-tazobactam  3.375 g Intravenous Q8H  . potassium chloride  40 mEq Oral Daily  . sodium chloride flush  3 mL Intravenous Q12H   Continuous Infusions:  PRN Meds:.sodium chloride, acetaminophen, bisacodyl **OR** bisacodyl, levalbuterol, ondansetron **OR** ondansetron (ZOFRAN) IV, oxyCODONE, sodium chloride flush, sodium chloride flush, traMADol  Xrays No results found.  Assessment/Plan: S/P Procedure(s) (LRB): CORONARY ARTERY BYPASS GRAFTING (CABG) x 4 (N/A) TRANSESOPHAGEAL ECHOCARDIOGRAM (TEE) (N/A)  LEFT ENDARTERECTOMY CAROTID (Left) RIGHT LEG GREATER SAPHENOUS VEIN HARVEST (Right)  1 now in sinus rhythm, QTc> 500, will reduce amio dose 2 bile- no organisms- poss d/c abx soon 3 LFT's normal, direct Bili a little low, 5.6 4 cont diuresis 5 cont ambulation/pulm rx 6 home in next 24-48 hours if no change- d/c epw's 7 add low dose ace-I    LOS: 12 days    GOLD,WAYNE E 11/19/2015 Bile cultures negative so far Stop Zosyn if cultures are negative tomorrow Probably home Thursday

## 2015-11-19 NOTE — Progress Notes (Signed)
Speech Language Pathology Treatment: Dysphagia  Patient Details Name: Brian Casey MRN: [id number] DOB: Apr 05, 1950 Today's Date: 11/19/2015 Time: 6599-3570 SLP Time Calculation (min) (ACUTE ONLY): 27 min  Assessment / Plan / Recommendation Clinical Impression  Pt seen for dysphagia followup. Pt was observed with noon meal and was following all previously discussed swallow precautions at modified independent level. Pt eliminated distractions prior to initiating meal, took small bites/sips and kept head in neutral position throughout meal. No s/s airway compromise throughout session. Pt agreeable to trialing meds whole with puree with next med pass. Encouraged pt to order foods from menu that are moist and easy to chew. Pt verbalized understanding and increased comfort with PO intake. No further followup planned at this time.     HPI HPI: 65 y.o. male is s/p left cea with concomitant cabg. Mild neuropraxia of left hypoglossal      SLP Plan  All goals met     Recommendations  Diet recommendations: Regular;Thin liquid Liquids provided via: Cup Medication Administration: Whole meds with puree (trial, could continue with liquid if needed) Supervision: Patient able to self feed;Intermittent supervision to cue for compensatory strategies Compensations: Slow rate;Small sips/bites;Minimize environmental distractions Postural Changes and/or Swallow Maneuvers: Out of bed for meals             Oral Care Recommendations: Oral care BID Follow up Recommendations: None Plan: All goals met     [place] MA, [place] Pager [phone] 11/19/2015, 1:33 PM

## 2015-11-19 NOTE — Progress Notes (Signed)
CARDIAC REHAB PHASE I   PRE:  Rate/Rhythm: 65 SR    BP: sitting 137/66    SaO2: 98 RA  MODE:  Ambulation: 550 ft   POST:  Rate/Rhythm: 95 SR    BP: sitting 156/64     SaO2: 97 RA  Tolerated well, some SOB toward end. Used IV pole to push, slightly unsteady, prob due to swelling. No major c/o, to recliner. Discussed smoking cessation and gave fake cigarette. Will f/u tomorrow. Encouraged x1 more walk. [place], ACSM 11/19/2015 3:18 PM

## 2015-11-19 NOTE — Progress Notes (Signed)
Biliary drain flushed with 5 cc sterile saline as per MD orders without difficulty.

## 2015-11-20 ENCOUNTER — Telehealth: Payer: Self-pay | Admitting: Vascular Surgery

## 2015-11-20 LAB — GLUCOSE, CAPILLARY
GLUCOSE-CAPILLARY: 175 mg/dL — AB (ref 65–99)
GLUCOSE-CAPILLARY: 93 mg/dL (ref 65–99)
Glucose-Capillary: 94 mg/dL (ref 65–99)
Glucose-Capillary: 126 mg/dL — ABNORMAL HIGH (ref 65–99)

## 2015-11-20 MED ORDER — LISINOPRIL 10 MG PO TABS
10.0000 mg | ORAL_TABLET | Freq: Every day | ORAL | Status: DC
  Administered 2015-11-20 – 2015-11-21 (×2): 10 mg via ORAL
  Filled 2015-11-20 (×2): qty 1

## 2015-11-20 NOTE — Progress Notes (Signed)
Patient lying in bed, no needs at this time. Call light within reach.

## 2015-11-20 NOTE — Progress Notes (Signed)
Pt has been walking today with his wife. Ed completed with pt and wife, good reception. Pt is motivated to quit smoking and drinking. Will send referral to [place]. [place], ACSM 12:17 PM 11/20/2015

## 2015-11-20 NOTE — Telephone Encounter (Signed)
Sched lab 9/25 at 4:00 and MD 9/29 at 2:00. Lm on hm# to inform pt.

## 2015-11-20 NOTE — Telephone Encounter (Signed)
-----  Message from Mena Goes, RN sent at 11/18/2015 10:45 AM EDT ----- Regarding: schedule 4 weeks w/ carotid duplex   ----- Message ----- From: Gabriel Earing, PA-C Sent: 11/18/2015   9:22 AM To: Vvs Charge Pool  F/u with Donzetta Matters in 4 weeks s/p CEA with duplex.  Thanks, Aldona Bar

## 2015-11-20 NOTE — Progress Notes (Signed)
Biliary drain was flushed with 5 cc sterile saline as per order and drain bag emptied of 50 ml fluid without complication.  Incisions painted with betadine as per order.

## 2015-11-20 NOTE — Progress Notes (Addendum)
[facility]  Subjective  - Doing well.  States he has    Objective (!) 156/73 78 98.3 F (36.8 C) (Oral) 18 98%  Intake/Output Summary (Last 24 hours) at 11/20/15 0811 Last data filed at 11/20/15 0700  Gross per 24 hour  Intake             1035 ml  Output             1425 ml  Net             -390 ml    Left neck incision healing well, min edema. Left tongue deviation, smile is symmetric.    Assessment/Planning: POD # 6 Left CEA Plan for follow up with Dr. Donzetta Matters in 4  weeks from surgery   Theda Sers [place] 11/20/2015 8:11 AM --  Laboratory Lab Results:  Recent Labs  11/18/15 0424 11/19/15 0514  WBC 10.2 10.2  HGB 9.1* 9.1*  HCT 27.7* 27.7*  PLT 240 252   BMET  Recent Labs  11/18/15 0424 11/19/15 0514  NA 135 136  K 3.6 4.0  CL 100* 101  CO2 28 28  GLUCOSE 95 105*  BUN 15 14  CREATININE 1.13 1.18  CALCIUM 8.2* 8.3*    COAG Lab Results  Component Value Date   INR 1.81 11/14/2015   INR 1.19 11/12/2015   INR 1.15 11/08/2015   No results found for: PTT   I have independently interviewed patient and agree with PA assessment and plan above. F/u in 4 weeks post discharge.  Brandon C. Donzetta Matters, MD [facility] Office: [phone] Pager: [phone]

## 2015-11-20 NOTE — Progress Notes (Addendum)
[address]       [place] [address]             [phone]      6 Days Post-Op Procedure(s) (LRB): CORONARY ARTERY BYPASS GRAFTING (CABG) x 4 (N/A) TRANSESOPHAGEAL ECHOCARDIOGRAM (TEE) (N/A) LEFT ENDARTERECTOMY CAROTID (Left) RIGHT LEG GREATER SAPHENOUS VEIN HARVEST (Right) Subjective: Feels well, some mild cough/clear sputum  Objective: Vital signs in last 24 hours: Temp:  [98 F (36.7 C)-98.3 F (36.8 C)] 98.3 F (36.8 C) (08/30 0446) Pulse Rate:  [62-79] 78 (08/30 0446) Cardiac Rhythm: Normal sinus rhythm (08/29 1900) Resp:  [17-18] 18 (08/30 0446) BP: (121-156)/(57-73) 156/73 (08/30 0446) SpO2:  [95 %-100 %] 98 % (08/30 0446) Weight:  [208 lb 15.9 oz (94.8 kg)] 208 lb 15.9 oz (94.8 kg) (08/30 0450)  Hemodynamic parameters for last 24 hours:    Intake/Output from previous day: 08/29 0701 - 08/30 0700 In: 1035 [P.O.:960; I.V.:20; IV Piggyback:50] Out: 1250 [Urine:1200; Drains:50] Intake/Output this shift: No intake/output data recorded.  General appearance: alert, cooperative and no distress Heart: regular rate and rhythm Lungs: some upper airway ronchi, improves with cough Abdomen: benign Extremities: + LE edema Wound: incis healing well  Lab Results:  Recent Labs  11/18/15 0424 11/19/15 0514  WBC 10.2 10.2  HGB 9.1* 9.1*  HCT 27.7* 27.7*  PLT 240 252   BMET:  Recent Labs  11/18/15 0424 11/19/15 0514  NA 135 136  K 3.6 4.0  CL 100* 101  CO2 28 28  GLUCOSE 95 105*  BUN 15 14  CREATININE 1.13 1.18  CALCIUM 8.2* 8.3*    PT/INR: No results for input(s): LABPROT, INR in the last 72 hours. ABG    Component Value Date/Time   PHART 7.352 11/15/2015 0340   HCO3 21.4 11/15/2015 0340   TCO2 25 11/15/2015 1651   ACIDBASEDEF 4.0 (H) 11/15/2015 0340   O2SAT 61.7 11/15/2015 0625   CBG (last 3)   Recent Labs  11/19/15 1616 11/19/15 2126 11/20/15 0619  GLUCAP 109* 147* 94    Results for orders placed or performed  during the hospital encounter of 11/07/15  Culture, body fluid-bottle     Status: Abnormal   Collection Time: 11/08/15  6:02 PM  Result Value Ref Range Status   Specimen Description BILE  Final   Special Requests BOTTLES DRAWN AEROBIC AND ANAEROBIC 5CC  Final   Gram Stain   Final    GRAM NEGATIVE RODS IN BOTH AEROBIC AND ANAEROBIC BOTTLES CRITICAL RESULT CALLED TO, READ BACK BY AND VERIFIED WITH: T KEETON,RN _0  11/09/15 MKELLY    Culture ESCHERICHIA COLI (A)  Final   Report Status 11/12/2015 FINAL  Final   Organism ID, Bacteria ESCHERICHIA COLI  Final      Susceptibility   Escherichia coli - MIC*    AMPICILLIN >=32 RESISTANT Resistant     CEFAZOLIN <=4 SENSITIVE Sensitive     CEFEPIME <=1 SENSITIVE Sensitive     CEFTAZIDIME <=1 SENSITIVE Sensitive     CEFTRIAXONE <=1 SENSITIVE Sensitive     CIPROFLOXACIN <=0.25 SENSITIVE Sensitive     GENTAMICIN <=1 SENSITIVE Sensitive     IMIPENEM <=0.25 SENSITIVE Sensitive     TRIMETH/SULFA <=20 SENSITIVE Sensitive     AMPICILLIN/SULBACTAM 16 INTERMEDIATE Intermediate     PIP/TAZO <=4 SENSITIVE Sensitive     Extended ESBL NEGATIVE Sensitive     * ESCHERICHIA COLI  Gram stain     Status: None   Collection Time:  11/08/15  6:02 PM  Result Value Ref Range Status   Specimen Description BILE  Final   Special Requests NONE  Final   Gram Stain   Final    ABUNDANT WBC PRESENT,BOTH PMN AND MONONUCLEAR ABUNDANT GRAM NEGATIVE RODS    Report Status 11/08/2015 FINAL  Final  Surgical pcr screen     Status: None   Collection Time: 11/08/15  8:00 PM  Result Value Ref Range Status   MRSA, PCR NEGATIVE NEGATIVE Final   Staphylococcus aureus NEGATIVE NEGATIVE Final    Comment:        The Xpert SA Assay (FDA approved for NASAL specimens in patients over 21 years of age), is one component of a comprehensive surveillance program.  Test performance has been validated by [facility] for patients greater than or equal to 33 year old. It is not  intended to diagnose infection nor to guide or monitor treatment.   Culture, blood (routine x 2)     Status: None   Collection Time: 11/08/15  9:19 PM  Result Value Ref Range Status   Specimen Description BLOOD LEFT ANTECUBITAL  Final   Special Requests BOTTLES DRAWN AEROBIC AND ANAEROBIC 5CC  Final   Culture NO GROWTH 5 DAYS  Final   Report Status 11/13/2015 FINAL  Final  Culture, blood (routine x 2)     Status: None   Collection Time: 11/08/15  9:29 PM  Result Value Ref Range Status   Specimen Description BLOOD RIGHT HAND  Final   Special Requests BOTTLES DRAWN AEROBIC AND ANAEROBIC 5CC  Final   Culture NO GROWTH 5 DAYS  Final   Report Status 11/13/2015 FINAL  Final  Body fluid culture     Status: None (Preliminary result)   Collection Time: 11/18/15  2:29 PM  Result Value Ref Range Status   Specimen Description BILE  Final   Special Requests NONE  Final   Gram Stain   Final    FEW WBC PRESENT, PREDOMINANTLY MONONUCLEAR NO ORGANISMS SEEN    Culture NO GROWTH < 24 HOURS  Final   Report Status PENDING  Incomplete    Meds Scheduled Meds: . amiodarone  200 mg Oral Daily  . aspirin EC  325 mg Oral Daily  . atorvastatin  40 mg Oral q1800  . chlorhexidine  15 mL Mouth Rinse BID  . chlorpheniramine-HYDROcodone  5 mL Oral QHS  . docusate sodium  200 mg Oral Daily  . escitalopram  10 mg Oral Daily  . famotidine  20 mg Oral BID  . feeding supplement  1 Container Oral TID WC  . ferrous [id number] C-folic acid  1 capsule Oral BID WC  . furosemide  40 mg Oral Daily  . guaiFENesin  600 mg Oral BID  . insulin aspart  0-24 Units Subcutaneous TID AC & HS  . insulin detemir  10 Units Subcutaneous Daily  . levothyroxine  100 mcg Oral QAC breakfast  . lisinopril  5 mg Oral Daily  . magic mouthwash  10 mL Oral TID  . mouth rinse  15 mL Mouth Rinse q12n4p  . metoprolol tartrate  25 mg Oral BID  . mometasone-formoterol  2 puff Inhalation BID  . piperacillin-tazobactam  3.375  g Intravenous Q8H  . potassium chloride  40 mEq Oral Daily  . sodium chloride flush  3 mL Intravenous Q12H   Continuous Infusions:  PRN Meds:.sodium chloride, acetaminophen, bisacodyl **OR** bisacodyl, levalbuterol, ondansetron **OR** ondansetron (ZOFRAN) IV, oxyCODONE, sodium chloride flush, sodium chloride  flush, traMADol  Xrays Dg Chest 2 View  Result Date: 11/19/2015 CLINICAL DATA:  Cough and chest pain EXAM: CHEST  2 VIEW COMPARISON:  November 17, 2015. FINDINGS: Central catheter tip is at the cavoatrial junction. No pneumothorax. There is patchy consolidation in the medial left base with small left effusion. Right lung is clear. There is mild cardiomegaly with pulmonary vascularity within normal limits. There is atherosclerotic calcification in the aorta. No adenopathy. No bone lesions. IMPRESSION: No left base consolidation medially with left pleural effusion. Right lung clear. Stable cardiac prominence. No pneumothorax. No adenopathy. There is aortic atherosclerosis. Electronically Signed   By: Lowella Grip III M.D.   On: 11/19/2015 08:04    Assessment/Plan: S/P Procedure(s) (LRB): CORONARY ARTERY BYPASS GRAFTING (CABG) x 4 (N/A) TRANSESOPHAGEAL ECHOCARDIOGRAM (TEE) (N/A) LEFT ENDARTERECTOMY CAROTID (Left) RIGHT LEG GREATER SAPHENOUS VEIN HARVEST (Right)  1 doing well overall 2 cont pulm toilet 3 cont diuresis 4 cultures - NGSF 5 Sinus rhythm: QTc is >600. Amiodarone may need to be stopped 6 will increase lisinopril dose     LOS: 13 days    GOLD,WAYNE E 11/20/2015 Bile culture negative 10 days of therapy- DC Zosyn Reduce amiodarone to 200 bid at discharge for periop afib Will need HHN to care for GB drain and dressings Poss home thurs  patient examined and medical record reviewed,agree with above note. Tharon Aquas Trigt III 11/20/2015

## 2015-11-20 NOTE — Progress Notes (Signed)
    Subjective:  Patient feels well. He is eager to go home. He denies chest pain or shortness of breath.  Objective:  Vital Signs in the last 24 hours: Temp:  [97.8 F (36.6 C)-98.3 F (36.8 C)] 97.8 F (36.6 C) (08/30 1230) Pulse Rate:  [74-79] 74 (08/30 1230) Resp:  [18-20] 20 (08/30 1230) BP: (117-156)/(63-73) 117/70 (08/30 1230) SpO2:  [95 %-100 %] 100 % (08/30 1230) Weight:  [94.8 kg (208 lb 15.9 oz)] 94.8 kg (208 lb 15.9 oz) (08/30 0450)  Intake/Output from previous day: 08/29 0701 - 08/30 0700 In: 1035 [P.O.:960; I.V.:20; IV Piggyback:50] Out: 1425 [Urine:1200; Drains:225]  Physical Exam: Pt is alert and oriented, NAD HEENT: normal Neck: JVP - normal Lungs: CTA bilaterally CV: RRR without murmur or gallop Abd: soft, NT Ext: 1+ edema Skin: warm/dry no rash   Lab Results:  Recent Labs  11/18/15 0424 11/19/15 0514  WBC 10.2 10.2  HGB 9.1* 9.1*  PLT 240 252    Recent Labs  11/18/15 0424 11/19/15 0514  NA 135 136  K 3.6 4.0  CL 100* 101  CO2 28 28  GLUCOSE 95 105*  BUN 15 14  CREATININE 1.13 1.18   No results for input(s): TROPONINI in the last 72 hours.  Invalid input(s): CK, MB  Tele: Normal sinus rhythm, personally reviewed  Assessment/Plan:  1. Non-STEMI: Progressing well after CABG 2. Postoperative atrial fibrillation: Continues on metoprolol and amiodarone. Appears to be maintaining sinus rhythm now. Some concern about his QT interval. Will check a 12-lead EKG this afternoon. 3. Carotid stenosis status post carotid endarterectomy: Followed by vascular surgery and appears to be doing well  Medications reviewed and include aspirin, metoprolol, atorvastatin, and amiodarone. Consider start ACE/ARB at outpatient follow-up as long as he remains stable.  Cardiology FU set up with Ignacia Bayley, NP 9/17 in [facility] office.   Sherren Mocha, M.D. 11/20/2015, 4:22 PM

## 2015-11-21 ENCOUNTER — Other Ambulatory Visit: Payer: Self-pay | Admitting: Family Medicine

## 2015-11-21 LAB — BODY FLUID CULTURE: Culture: NO GROWTH

## 2015-11-21 LAB — GLUCOSE, CAPILLARY: GLUCOSE-CAPILLARY: 107 mg/dL — AB (ref 65–99)

## 2015-11-21 MED ORDER — ASPIRIN 325 MG PO TBEC: 325.0000 mg | DELAYED_RELEASE_TABLET | Freq: Every day | ORAL | Status: DC

## 2015-11-21 MED ORDER — POTASSIUM CHLORIDE CRYS ER 20 MEQ PO TBCR: 20.0000 meq | EXTENDED_RELEASE_TABLET | Freq: Every day | ORAL | 0 refills | Status: DC

## 2015-11-21 MED ORDER — FE FUMARATE-B12-VIT C-FA-IFC PO CAPS: 1.0000 | ORAL_CAPSULE | Freq: Two times a day (BID) | ORAL | 1 refills | Status: DC

## 2015-11-21 MED ORDER — METOPROLOL TARTRATE 25 MG PO TABS: 25.0000 mg | ORAL_TABLET | Freq: Two times a day (BID) | ORAL | 1 refills | Status: DC

## 2015-11-21 MED ORDER — OXYCODONE HCL 5 MG PO TABS: 5.0000 mg | ORAL_TABLET | Freq: Four times a day (QID) | ORAL | 0 refills | Status: DC | PRN

## 2015-11-21 MED ORDER — LISINOPRIL 10 MG PO TABS: 10.0000 mg | ORAL_TABLET | Freq: Every day | ORAL | 1 refills | Status: DC

## 2015-11-21 MED ORDER — AMIODARONE HCL 200 MG PO TABS: 200.0000 mg | ORAL_TABLET | Freq: Every day | ORAL | 1 refills | Status: DC

## 2015-11-21 MED ORDER — FUROSEMIDE 40 MG PO TABS: 40.0000 mg | ORAL_TABLET | Freq: Every day | ORAL | 0 refills | Status: DC

## 2015-11-21 NOTE — Progress Notes (Addendum)
[address]       [employer] [address]             [phone]      7 Days Post-Op Procedure(s) (LRB): CORONARY ARTERY BYPASS GRAFTING (CABG) x 4 (N/A) TRANSESOPHAGEAL ECHOCARDIOGRAM (TEE) (N/A) LEFT ENDARTERECTOMY CAROTID (Left) RIGHT LEG GREATER SAPHENOUS VEIN HARVEST (Right) Subjective: conts to feel well  Objective: Vital signs in last 24 hours: Temp:  [97.8 F (36.6 C)-98.4 F (36.9 C)] 98.2 F (36.8 C) (08/31 0523) Pulse Rate:  [74-88] 75 (08/31 0523) Cardiac Rhythm: Normal sinus rhythm (08/30 1900) Resp:  [16-20] 18 (08/31 0523) BP: (117-155)/(68-76) 134/68 (08/31 0523) SpO2:  [97 %-100 %] 97 % (08/31 0523) Weight:  [207 lb 8 oz (94.1 kg)] 207 lb 8 oz (94.1 kg) (08/31 0531)  Hemodynamic parameters for last 24 hours:    Intake/Output from previous day: 08/30 0701 - 08/31 0700 In: 840 [P.O.:840] Out: 1965 [Urine:1900; Drains:65] Intake/Output this shift: No intake/output data recorded.  General appearance: alert, cooperative and no distress Heart: regular rate and rhythm Lungs: clear to auscultation bilaterally Abdomen: benign, grain working well Extremities: + pitting edema Wound: incis healing well  Lab Results:  Recent Labs  11/19/15 0514  WBC 10.2  HGB 9.1*  HCT 27.7*  PLT 252   BMET:  Recent Labs  11/19/15 0514  NA 136  K 4.0  CL 101  CO2 28  GLUCOSE 105*  BUN 14  CREATININE 1.18  CALCIUM 8.3*    PT/INR: No results for input(s): LABPROT, INR in the last 72 hours. ABG    Component Value Date/Time   PHART 7.352 11/15/2015 0340   HCO3 21.4 11/15/2015 0340   TCO2 25 11/15/2015 1651   ACIDBASEDEF 4.0 (H) 11/15/2015 0340   O2SAT 61.7 11/15/2015 0625   CBG (last 3)   Recent Labs  11/20/15 1646 11/20/15 2107 11/21/15 0609  GLUCAP 93 126* 107*    Meds Scheduled Meds: . amiodarone  200 mg Oral Daily  . aspirin EC  325 mg Oral Daily  . atorvastatin  40 mg Oral q1800  . chlorhexidine  15 mL Mouth Rinse  BID  . chlorpheniramine-HYDROcodone  5 mL Oral QHS  . docusate sodium  200 mg Oral Daily  . escitalopram  10 mg Oral Daily  . famotidine  20 mg Oral BID  . feeding supplement  1 Container Oral TID WC  . ferrous [id number] C-folic acid  1 capsule Oral BID WC  . furosemide  40 mg Oral Daily  . guaiFENesin  600 mg Oral BID  . insulin aspart  0-24 Units Subcutaneous TID AC & HS  . insulin detemir  10 Units Subcutaneous Daily  . levothyroxine  100 mcg Oral QAC breakfast  . lisinopril  10 mg Oral Daily  . magic mouthwash  10 mL Oral TID  . mouth rinse  15 mL Mouth Rinse q12n4p  . metoprolol tartrate  25 mg Oral BID  . mometasone-formoterol  2 puff Inhalation BID  . potassium chloride  40 mEq Oral Daily  . sodium chloride flush  3 mL Intravenous Q12H   Continuous Infusions:  PRN Meds:.sodium chloride, acetaminophen, bisacodyl **OR** bisacodyl, levalbuterol, ondansetron **OR** ondansetron (ZOFRAN) IV, oxyCODONE, sodium chloride flush, sodium chloride flush, traMADol  Xrays Dg Chest 2 View  Result Date: 11/19/2015 CLINICAL DATA:  Cough and chest pain EXAM: CHEST  2 VIEW COMPARISON:  November 17, 2015. FINDINGS: Central catheter tip is at the cavoatrial junction. No  pneumothorax. There is patchy consolidation in the medial left base with small left effusion. Right lung is clear. There is mild cardiomegaly with pulmonary vascularity within normal limits. There is atherosclerotic calcification in the aorta. No adenopathy. No bone lesions. IMPRESSION: No left base consolidation medially with left pleural effusion. Right lung clear. Stable cardiac prominence. No pneumothorax. No adenopathy. There is aortic atherosclerosis. Electronically Signed   By: Lowella Grip III M.D.   On: 11/19/2015 08:04    Assessment/Plan: S/P Procedure(s) (LRB): CORONARY ARTERY BYPASS GRAFTING (CABG) x 4 (N/A) TRANSESOPHAGEAL ECHOCARDIOGRAM (TEE) (N/A) LEFT ENDARTERECTOMY CAROTID (Left) RIGHT LEG GREATER  SAPHENOUS VEIN HARVEST (Right) Plan for discharge: see discharge orders QTc improved, approx 500   LOS: 14 days    GOLD,WAYNE E 11/21/2015  patient examined and medical record reviewed,agree with above note. Tharon Aquas Trigt III 11/21/2015

## 2015-11-22 ENCOUNTER — Other Ambulatory Visit: Payer: Self-pay | Admitting: *Deleted

## 2015-11-22 DIAGNOSIS — I2510 Atherosclerotic heart disease of native coronary artery without angina pectoris: Secondary | ICD-10-CM

## 2015-11-22 DIAGNOSIS — R0989 Other specified symptoms and signs involving the circulatory and respiratory systems: Secondary | ICD-10-CM

## 2015-11-22 DIAGNOSIS — D500 Iron deficiency anemia secondary to blood loss (chronic): Secondary | ICD-10-CM

## 2015-11-22 DIAGNOSIS — J441 Chronic obstructive pulmonary disease with (acute) exacerbation: Secondary | ICD-10-CM

## 2015-11-22 MED ORDER — GUAIFENESIN ER 600 MG PO TB12: 600.0000 mg | ORAL_TABLET | Freq: Two times a day (BID) | ORAL | Status: AC

## 2015-11-22 MED ORDER — FERROUS SULFATE 325 (65 FE) MG PO TABS: 325.0000 mg | ORAL_TABLET | Freq: Every day | ORAL | 0 refills | Status: DC

## 2015-11-22 MED ORDER — FOLIC ACID 1 MG PO TABS: 1.0000 mg | ORAL_TABLET | Freq: Every day | ORAL | 0 refills | Status: AC

## 2015-11-22 MED ORDER — MOMETASONE FURO-FORMOTEROL FUM 200-5 MCG/ACT IN AERO: 2.0000 | INHALATION_SPRAY | Freq: Two times a day (BID) | RESPIRATORY_TRACT | 1 refills | Status: DC

## 2015-11-22 MED ORDER — THERA VITAL M PO TABS: 1.0000 | ORAL_TABLET | Freq: Every day | ORAL | 0 refills | Status: DC

## 2015-11-22 NOTE — Care Management Note (Signed)
Case Management Note Marvetta Gibbons RN, BSN Unit 2W-Case Manager [phone]  Patient Details  Name: Brian Casey MRN: [id number] Date of Birth: 1950-10-22  Subjective/Objective:    Pt s/p CABG and CEA also had biliary drain placed -please see previous CM notes               Action/Plan: PTA pt lived at home with wife- plan is to return home with wife- orders for [facility] for biliary drain care- spoke with pt and wife prior to discharge on 8/31- to offer choice for [facility] services- list provided for [facility] providers- per choice they would like to use [facility] if in-network and second [facility]- call made to [facility] first who states that they are in-network however do not have the staffing to provide services- call then made to [facility] who also does not have staffing this weekend to provide services- went back to pt and wife to see if they had any further choice and they state that they just want to be sure the provider is in-network- calls made to multiple home health agencies to see about services and was told either that they were not in services or did not have the staffing and could not take the referral- will continue to try to find an agency in the am for [facility]- call made to pt's wife to update  Expected Discharge Date:    11/21/15              Expected Discharge Plan:  [place]  In-House Referral:     Discharge planning Services  CM Consult  Post Acute Care Choice:  Home Health Choice offered to:  Patient, Spouse  DME Arranged:    DME Agency:     HH Arranged:  RN [place] Agency:  [facility]  Status of Service:  Completed, signed off  If discussed at [place] of Stay Meetings, dates discussed:    Additional Comments:  11/22/15- 1000- Haider Hornaday RN, CM- again tried to find [facility] RN services for pt- call made to [facility] with [facility]- they could not take the referral however worked to find an agency that could take the referral for them- services have  been set up for [facility] with [facility]- call made to pt to let him know that he should be hearing from [facility] in the next 24-48 hr. Pt also has [facility] phone # if needed and this CM # if needed.   Dawayne Patricia, RN 11/22/2015, 4:42 PM

## 2015-11-26 ENCOUNTER — Telehealth: Payer: Self-pay | Admitting: Cardiovascular Disease

## 2015-11-26 NOTE — Telephone Encounter (Signed)
Home health nurse calling asking to call her as well so she can "be in the loop"

## 2015-11-26 NOTE — Telephone Encounter (Signed)
Pt c/o BP issue: STAT if pt c/o blurred vision, one-sided weakness or slurred speech  1. What are your last 5 BP readings?  9/5-sitting 121/59, HR 87, Standing 75/59, HR 97 9/4 -78/48, sitting 110/54 9/2-99/69 States he is daily continuing to lose weight. Yesterday 192.2 in the am 187.4 in the pm 2. Are you having any other symptoms (ex. Dizziness, headache, blurred vision, passed out)?   3. What is your BP issue? Too low  States he has stopped Furosemide, Lisinopril, and Metoprolol as of this morning. Advised to do this by Dr. Servando Snare, surgeon.  States Saturday his home health nurse noticed his left foot was "extremely more cold than the right foot". Swelling today in his right foot.  Please call. [facility], if no answer, [phone]

## 2015-11-26 NOTE — Telephone Encounter (Signed)
This patient has never been seen by pulmonary, and has no upcoming appointments.  I will complete this message out of our box, as it was meant to be routed to cardiology.

## 2015-11-26 NOTE — Telephone Encounter (Addendum)
We have never seen this gentleman.   He was seen in the hospital, has an appt w/ Ignacia Bayley, NP on 9/15 to est care.  Does anyone have any openings before then?

## 2015-11-27 ENCOUNTER — Other Ambulatory Visit: Payer: Self-pay | Admitting: Surgery

## 2015-11-27 DIAGNOSIS — K819 Cholecystitis, unspecified: Secondary | ICD-10-CM

## 2015-11-28 ENCOUNTER — Encounter: Payer: Self-pay | Admitting: Physician Assistant

## 2015-11-28 ENCOUNTER — Ambulatory Visit (INDEPENDENT_AMBULATORY_CARE_PROVIDER_SITE_OTHER): Payer: Managed Care, Other (non HMO) | Admitting: Physician Assistant

## 2015-11-28 VITALS — BP 121/83 | HR 78 | Ht 67.0 in | Wt 187.2 lb

## 2015-11-28 DIAGNOSIS — R059 Cough, unspecified: Secondary | ICD-10-CM

## 2015-11-28 DIAGNOSIS — K810 Acute cholecystitis: Secondary | ICD-10-CM

## 2015-11-28 DIAGNOSIS — I480 Paroxysmal atrial fibrillation: Secondary | ICD-10-CM

## 2015-11-28 DIAGNOSIS — Z951 Presence of aortocoronary bypass graft: Secondary | ICD-10-CM

## 2015-11-28 DIAGNOSIS — I779 Disorder of arteries and arterioles, unspecified: Secondary | ICD-10-CM

## 2015-11-28 DIAGNOSIS — I2510 Atherosclerotic heart disease of native coronary artery without angina pectoris: Secondary | ICD-10-CM

## 2015-11-28 DIAGNOSIS — N182 Chronic kidney disease, stage 2 (mild): Secondary | ICD-10-CM

## 2015-11-28 DIAGNOSIS — I10 Essential (primary) hypertension: Secondary | ICD-10-CM

## 2015-11-28 DIAGNOSIS — I739 Peripheral vascular disease, unspecified: Secondary | ICD-10-CM

## 2015-11-28 DIAGNOSIS — R05 Cough: Secondary | ICD-10-CM

## 2015-11-28 DIAGNOSIS — E785 Hyperlipidemia, unspecified: Secondary | ICD-10-CM

## 2015-11-28 MED ORDER — FUROSEMIDE 20 MG PO TABS: 20.0000 mg | ORAL_TABLET | Freq: Every day | ORAL | 3 refills | Status: DC

## 2015-11-28 MED ORDER — METOPROLOL TARTRATE 25 MG PO TABS: 12.5000 mg | ORAL_TABLET | Freq: Two times a day (BID) | ORAL | 1 refills | Status: DC

## 2015-11-28 MED ORDER — POTASSIUM CHLORIDE ER 10 MEQ PO TBCR: 10.0000 meq | EXTENDED_RELEASE_TABLET | Freq: Every day | ORAL | 3 refills | Status: DC

## 2015-11-28 MED ORDER — HYDROCODONE-HOMATROPINE 5-1.5 MG/5ML PO SYRP: ORAL_SOLUTION | ORAL | 0 refills | Status: DC

## 2015-11-28 NOTE — Patient Instructions (Addendum)
Medication Instructions:  Please DECREASE your Lopressor to 12.5 mg (1/2 pill) twice daily   Please START lasix 20 mg once daily AND potassium 20 meq (take together)  Labwork: BMET, CBC  Testing/Procedures: None  Follow-Up: 3 months w/ Ryan or Dr. Rockey Situ  If you need a refill on your cardiac medications before your next appointment, please call your pharmacy.

## 2015-11-28 NOTE — Progress Notes (Signed)
Cardiology Office Note Date:  11/28/2015  Patient ID:  Brian Casey, Brian Casey 1950-07-21, MRN [id number] PCP:  [name], MD  Cardiologist:  Dr. Rockey Situ, MD    Chief Complaint: Hospital follow up  History of Present Illness: Brian Casey is a 65 y.o. male with history of diagnosed CAD s/p 4-vessel CABG as below, recently diagnosed carotid artery disease s/p LICA endarerectomy 7/09, PAF not on full-dose anticoagulation, CKD stage II, DM2, malnutrition, HTN, HLD, hypothyroidism, tobacco abuse for 48 years, and chronic alcohol abuse presents to clinic for hospital follow up.   Prior to his admission in mid August he had no previously known cardiac history. He presented to [facility] on 11/04/15 with a 3-day history of RUQ pain, no chest pain. He underwent CT abdomen/pelvis that showed acute acalculus cholecystitis. He was also noted to be in new onset Afib with RVR at that time with heart rates in the 170's bpm. He was asymptomatic. Cardiology was asked to evaluate the patient for his new onset Afib and for pre-operative evaluation for cholecystectomy. His troponin peaked at 0.11. His Afib was rate controlled and ultimately converted to sinus rhythm on IV amiodarone. Echo on 8/14 showed an EF of 50-55%, no RWMA, GR2DD, PASP normal. He underwent Lexiscan Myoview on 11/06/15 that was high-risk with moderate sized region of moderate intensity in the lateral wall, EF 68%. Given his elevated troponin and abnormal stress test he underwent LHC that showed severe 4-vessel CAD. He was transferred to [facility] for cardiac bypass surgery. He was intially consulted on by IR for placement of a cholecystostomy tube given his acalculus cholecystitis on 8/18 (he was discharged with a gall bladder drain with planned surgery follow up 6 weeks s/p CABG for drain removal and cholecystectomy). He underwent successful 4-vessel CABG on 11/14/2015 with LIMA to LAD, SVG to Diag, SVG to RI, SVG to PDA. He was also found to have severe LICA  stenosis at 62-83% and underwent successful left carotid endarterectomy on 8/24. He had 60-79% RICA stenosis. His admission was complicated by   He called the office on 9/5 with BP readings 121/59 sitting and 75/59 standing, HR 97. Prior BP reading from 9/4 of 110/54 sitting and 78/48 standing. On 9/2 BP was 99/69. Weight decreased from 192 to 187 on 9/4. His Lasix, lisinopril, and metoprolol were held by CVTS. He also noted the left foot was colder than the right with some swelling of the right foot on 9/3.   He comes in today with several concerns: 1) BP has been on the lower side since his discharge as above. He did not stop Lopressor as directed above and continued to take 25 mg bid. He does not drink much fluid during the day and his wife reports he has a low appetite though that has always been him. 2) RLE with hematoma and mild swelling. Has been keeping leg elevated at home. No bleeding, erythema, warmth, or purulent drainage. 3) Cough that is not productive has persisted since his discharge. Seems to be improving the past 1-2 days. Asks for cough medication. 4) They ask about getting further supplies for gallbladder drain including syringes and a bag for the drain.   No further RUQ pain. Never with chest pain or SOB. No symptoms concerning for Afib.    Past Medical History:  Diagnosis Date  . Acute renal failure ([place]) 11/06/2015   [name] 11/06/2015  . Anxiety   . Arthritis    "lower spine; fingers" (11/07/2015)  . Depression   .  GERD (gastroesophageal reflux disease)   . High cholesterol   . Hypertension 10/17/2013  . Hyperthyroidism    "had it radiated in his '19s"  . Hypothyroidism   . New onset atrial fibrillation ([name]) 11/06/2015   [name] 11/07/2015  . NSTEMI (non-ST elevated myocardial infarction) ([place]) 11/07/2015  . Thyroid disease   . Type II diabetes mellitus ([place])     Past Surgical History:  Procedure Laterality Date  . CARDIAC CATHETERIZATION Right 11/06/2015    Procedure: Left Heart Cath and Coronary Angiography;  Surgeon: Minna Merritts, MD;  Location: [facility];  Service: Cardiovascular;  Laterality: Right;  . COLONOSCOPY    . CORONARY ARTERY BYPASS GRAFT N/A 11/14/2015   Procedure: CORONARY ARTERY BYPASS GRAFTING (CABG) x 4;  Surgeon: Ivin Poot, MD;  Location: [facility];  Service: Open Heart Surgery;  Laterality: N/A;  . ENDARTERECTOMY Left 11/14/2015   Procedure: LEFT ENDARTERECTOMY CAROTID;  Surgeon: Waynetta Sandy, MD;  Location: [facility];  Service: Vascular;  Laterality: Left;  . FINGER GANGLION CYST EXCISION Left X 3   "index finger X 2; thumb X 1"  . [place]  . HERNIA REPAIR    . INSERTION OF MESH N/A 11/16/2013   Procedure: INSERTION OF MESH;  Surgeon: Imogene Burn. Georgette Dover, MD;  Location: [facility];  Service: General;  Laterality: N/A;  . IR GENERIC HISTORICAL  11/08/2015   IR PERC CHOLECYSTOSTOMY 11/08/2015 Corrie Mckusick, DO [facility]  . TEE WITHOUT CARDIOVERSION N/A 11/14/2015   Procedure: TRANSESOPHAGEAL ECHOCARDIOGRAM (TEE);  Surgeon: Ivin Poot, MD;  Location: [facility];  Service: Open Heart Surgery;  Laterality: N/A;  . UMBILICAL HERNIA REPAIR N/A 11/16/2013   Procedure: UMBILICAL HERNIA REPAIR;  Surgeon: Imogene Burn. Georgette Dover, MD;  Location: [facility] [place];  Service: General;  Laterality: N/A;  . VEIN HARVEST Right 11/14/2015   Procedure: RIGHT LEG GREATER SAPHENOUS VEIN HARVEST;  Surgeon: Ivin Poot, MD;  Location: [facility];  Service: Open Heart Surgery;  Laterality: Right;    Current Outpatient Prescriptions  Medication Sig Dispense Refill  . amiodarone (PACERONE) 200 MG tablet Take 1 tablet (200 mg total) by mouth daily. 30 tablet 1  . aspirin EC 325 MG EC tablet Take 1 tablet (325 mg total) by mouth daily.    Marland Kitchen escitalopram (LEXAPRO) 10 MG tablet Take 1 tablet (10 mg total) by mouth daily. 30 tablet 0  . ferrous sulfate 325 (65 FE) MG tablet Take 1 tablet (325 mg total) by mouth daily with breakfast. 30 tablet  0  . folic acid (FOLVITE) 1 MG tablet Take 1 tablet (1 mg total) by mouth daily. 30 tablet 0  . levothyroxine (SYNTHROID, LEVOTHROID) 100 MCG tablet Take 1 tablet (100 mcg total) by mouth daily before breakfast. OFFICE VISIT WITH  LABS REQUIRED FOR ADDITIONAL REFILLS 30 tablet 0  . metFORMIN (GLUCOPHAGE) 500 MG tablet Take 500 mg by mouth 2 (two) times daily with a meal.    . metoprolol tartrate (LOPRESSOR) 25 MG tablet Take 1 tablet (25 mg total) by mouth 2 (two) times daily. 60 tablet 1  . mometasone-formoterol (DULERA) 200-5 MCG/ACT AERO Inhale 2 puffs into the lungs 2 (two) times daily. 1 Inhaler 1  . Multiple Vitamins-Minerals (MULTIVITAMIN) tablet Take 1 tablet by mouth daily. 30 tablet 0  . omeprazole (PRILOSEC) 20 MG capsule Take 1 capsule (20 mg total) by mouth daily. OFFICE VISIT REQUIRED FOR ADDITIONAL REFILLS 30 capsule 0  . oxyCODONE (OXY IR/ROXICODONE) 5 MG  immediate release tablet Take 1-2 tablets (5-10 mg total) by mouth every 6 (six) hours as needed for severe pain. 30 tablet 0  . potassium chloride SA (K-DUR,KLOR-CON) 20 MEQ tablet Take 1 tablet (20 mEq total) by mouth daily. 30 tablet 0  . Probiotic Product (PROBIOTIC PO) Take by mouth.    . simvastatin (ZOCOR) 40 MG tablet Take 1 tablet (40 mg total) by mouth at bedtime. 90 tablet 0   Current Facility-Administered Medications  Medication Dose Route Frequency Provider Last Rate Last Dose  . guaiFENesin (MUCINEX) 12 hr tablet 600 mg  600 mg Oral BID Ivin Poot, MD        Allergies:   Penicillins and Bupropion hcl   Social History:  The patient  reports that he has been smoking Cigarettes.  He has a 5.76 pack-year smoking history. He has never used smokeless tobacco. He reports that he drinks about 34.2 oz of alcohol per week . He reports that he does not use drugs.   Family History:  The patient's family history includes Cancer in his father, maternal grandmother, and mother; Colon cancer in his mother; Depression in his  mother; Diabetes in his maternal grandmother and mother; Hypertension in his father and mother; Hyperthyroidism in his father.  ROS:   Review of Systems  Constitutional: Positive for malaise/fatigue. Negative for chills, diaphoresis, fever and weight loss.  HENT: Negative for congestion.   Eyes: Negative for discharge and redness.  Respiratory: Positive for cough. Negative for sputum production, shortness of breath and wheezing.   Cardiovascular: Positive for leg swelling. Negative for chest pain, palpitations, orthopnea, claudication and PND.  Gastrointestinal: Negative for abdominal pain, heartburn, nausea and vomiting.  Musculoskeletal: Negative for falls and myalgias.  Skin: Negative for rash.  Neurological: Positive for weakness. Negative for dizziness, tingling, tremors, sensory change, speech change, focal weakness and loss of consciousness.  Endo/Heme/Allergies: Does not bruise/bleed easily.  Psychiatric/Behavioral: Negative for substance abuse. The patient is not nervous/anxious.   All other systems reviewed and are negative.    PHYSICAL EXAM:  VS:  BP 121/83 (BP Location: Right Arm, Patient Position: Sitting, Cuff Size: Normal)   Pulse 78   Ht _0  (1.702 m)   Wt 187 lb 4 oz (84.9 kg)   BMI 29.33 kg/m  BMI: Body mass index is 29.33 kg/m.  Physical Exam  Constitutional: He is oriented to person, place, and time. He appears well-developed and well-nourished.  HENT:  Head: Normocephalic and atraumatic.  Eyes: Right eye exhibits no discharge. Left eye exhibits no discharge.  Neck: Normal range of motion. No JVD present.  Surgical site well healing.   Cardiovascular: Normal rate, regular rhythm, S1 normal, S2 normal and normal heart sounds.  Exam reveals no distant heart sounds, no friction rub, no midsystolic click and no opening snap.   No murmur heard. Anterior chest wall surgical sites well healing without bleeding, bruising, swelling, TTP, or purulent discharge.     Pulmonary/Chest: Effort normal. No respiratory distress. He has no decreased breath sounds. He has no wheezes. He has rales. He exhibits no tenderness.  Faint rales along bilateral bases  Abdominal: Soft. He exhibits no distension. There is no tenderness.  Gallbladder drain present without surrounding erythema.   Musculoskeletal: He exhibits edema.  Trace pre-tibial edema of the RLE below surgical site. Hematoma noted below surgical site of RLE, TTP. No erythema, warmth, active bleeding, or purulent drainage.   Neurological: He is alert and oriented to person, place, and time.  Skin: Skin is warm and dry. No cyanosis. Nails show no clubbing.  Psychiatric: He has a normal mood and affect. His speech is normal and behavior is normal. Judgment and thought content normal.     EKG:  Was ordered and interpreted by me today. Shows NSR, 78 bpm, low voltage QRS, nonspecific st/t changes  Orthostatic vitals: Lying: 122/80, 78 bpm Sitting: 111/77, 80 bpm Standing: 102/71, 85 bpm [place] x 3 min: 102/69, 90 bpm  Recent Labs: 11/08/2015: B Natriuretic Peptide 907.9 11/09/2015: TSH 4.905 11/15/2015: Magnesium 2.5 11/19/2015: ALT 19; BUN 14; Creatinine, Ser 1.18; Hemoglobin 9.1; Platelets 252; Potassium 4.0; Sodium 136  11/09/2015: Triglycerides 437   Estimated Creatinine Clearance: 66.7 mL/min (by C-G formula based on SCr of 1.18 mg/dL).   Wt Readings from Last 3 Encounters:  11/28/15 187 lb 4 oz (84.9 kg)  11/21/15 207 lb 8 oz (94.1 kg)  11/04/15 207 lb 6.4 oz (94.1 kg)     Other studies reviewed: Additional studies/records reviewed today include: summarized above  ASSESSMENT AND PLAN:  1. CAD s/p CABG as above: No symptoms concerning for angina. Discussed with patient possibly decreasing aspirin to 81 mg daily with the addition of Plavix 75 mg daily or even Brilinta. They prefer to wait until his gallbladder has been taken out. I made them aware from a cardiac standpoint the sooner this  change is made the better. He has follow up with CVTS, perhaps they can discuss this with him as well. No signs of infection at this time along the RLE. Hematoma is noted. Advised patient to perform warm compresses and elevate leg. Will check CBC. Follow up with CVTS.   2. Carotid artery disease: Status post LICA endarterectomy. Residual RICA disease medically managed. Lipitor added as below. Plan for repeat carotid duplex per vascular.  3. PAF: No symptoms concerning for recurrent arrhythmia at this time. May be able to discontinue amiodarone at follow up. Continue Lopressor as below. Not on full-dose anticoagulation as this was a one-time event in the setting of acute cholecystitis as well as need for antiplatelet therapy with increased risk of bleed seen in dual therapy.   4. Acute on chronic diastolic CHF: Restart low-dose Lasix 20 mg daily. Decrease Lopressor to 12.5 mg bid in an effort to allow for enough BP room to add Lasix as above. No salt. Ultimately, he may not need Lasix on a daily basis, though would benefit from it currently. He asks for cough medication. Hycodan prescription was written for him. Any further refills will need to come from PCP. Check bmet.   5. HTN: Orthostatic. Increase fluids.   6. HLD: Change simvastatin to Lipitor 40 mg daily at next visit. Recheck lipid and LFT in 6 weeks.   7. Polysubstance abuse: He has not smoked or drank alcohol since his admission.   8. Cholecystitis: Planned surgery visit as above 6 weeks s/p CABG. Advised patient to contact general surgery office or radiology at [facility] since they placed the drain for supplies needed of his drain.   Disposition: F/u with Dr. Rockey Situ in 3 months.    Current medicines are reviewed at length with the patient today.  The patient did not have any concerns regarding medicines.  Melvern Banker PA-C 11/28/2015 3:37 PM     [address], [place] [address] [phone]

## 2015-11-29 ENCOUNTER — Telehealth: Payer: Self-pay | Admitting: Cardiovascular Disease

## 2015-11-29 ENCOUNTER — Other Ambulatory Visit: Payer: Self-pay

## 2015-11-29 DIAGNOSIS — Z79899 Other long term (current) drug therapy: Secondary | ICD-10-CM

## 2015-11-29 DIAGNOSIS — I214 Non-ST elevation (NSTEMI) myocardial infarction: Secondary | ICD-10-CM

## 2015-11-29 DIAGNOSIS — E785 Hyperlipidemia, unspecified: Secondary | ICD-10-CM

## 2015-11-29 LAB — BASIC METABOLIC PANEL
BUN / CREAT RATIO: 11 (ref 10–24)
BUN: 16 mg/dL (ref 8–27)
CALCIUM: 9.6 mg/dL (ref 8.6–10.2)
CO2: 20 mmol/L (ref 18–29)
Chloride: 91 mmol/L — ABNORMAL LOW (ref 96–106)
Creatinine, Ser: 1.47 mg/dL — ABNORMAL HIGH (ref 0.76–1.27)
GFR, EST AFRICAN AMERICAN: 58 mL/min/{1.73_m2} — AB (ref >59)
GFR, EST NON AFRICAN AMERICAN: 50 mL/min/{1.73_m2} — AB (ref >59)
Glucose: 111 mg/dL — ABNORMAL HIGH (ref 65–99)
Potassium: 4.9 mmol/L (ref 3.5–5.2)
Sodium: 134 mmol/L (ref 134–144)

## 2015-11-29 LAB — CBC WITH DIFFERENTIAL/PLATELET
BASOS: 0 %
Basophils Absolute: 0 10*3/uL (ref 0.0–0.2)
EOS (ABSOLUTE): 0.3 10*3/uL (ref 0.0–0.4)
Eos: 3 %
HEMATOCRIT: 34.7 % — AB (ref 37.5–51.0)
HEMOGLOBIN: 11.4 g/dL — AB (ref 12.6–17.7)
IMMATURE GRANS (ABS): 0 10*3/uL (ref 0.0–0.1)
Immature Granulocytes: 0 %
LYMPHS ABS: 1.3 10*3/uL (ref 0.7–3.1)
LYMPHS: 13 %
MCH: 30.6 pg (ref 26.6–33.0)
MCHC: 32.9 g/dL (ref 31.5–35.7)
MCV: 93 fL (ref 79–97)
MONOCYTES: 6 %
Monocytes Absolute: 0.6 10*3/uL (ref 0.1–0.9)
NEUTROS ABS: 7.7 10*3/uL — AB (ref 1.4–7.0)
Neutrophils: 78 %
Platelets: 531 10*3/uL — ABNORMAL HIGH (ref 150–379)
RBC: 3.73 x10E6/uL — ABNORMAL LOW (ref 4.14–5.80)
RDW: 16 % — ABNORMAL HIGH (ref 12.3–15.4)
WBC: 10 10*3/uL (ref 3.4–10.8)

## 2015-11-29 MED ORDER — ATORVASTATIN CALCIUM 40 MG PO TABS: 40.0000 mg | ORAL_TABLET | Freq: Every day | ORAL | 3 refills | Status: DC

## 2015-11-29 NOTE — Telephone Encounter (Signed)
Patient has tolerated this combination without issues. However, ok to change simvastatin to Lipitor 40 mg. Had planned on doing this at his follow up, but ok to do so now. Recheck lipid and LFT in 6 weeks.

## 2015-11-29 NOTE — Telephone Encounter (Signed)
Spoke w/ pt's wife.  Advised her of Ryan's recommendation.  Spoke w/ Rip Harbour, RN, as well. They would prefer to go ahead and change to Lipitor 40 mg #90. They are appreciative and will call back w/ any further questions or concerns.

## 2015-11-29 NOTE — Telephone Encounter (Signed)
Brian Casey with [facility] calling asking [place] to call her she has some concerns: Wanted to let us know about a drug interaction and wanted to come changes in the medication   amiodarone and simvastatin is the interaction.   Please call back.

## 2015-12-03 ENCOUNTER — Other Ambulatory Visit (INDEPENDENT_AMBULATORY_CARE_PROVIDER_SITE_OTHER): Payer: Managed Care, Other (non HMO) | Admitting: *Deleted

## 2015-12-03 DIAGNOSIS — I214 Non-ST elevation (NSTEMI) myocardial infarction: Secondary | ICD-10-CM

## 2015-12-04 LAB — BASIC METABOLIC PANEL
BUN/Creatinine Ratio: 12 (ref 10–24)
BUN: 17 mg/dL (ref 8–27)
CO2: 23 mmol/L (ref 18–29)
CREATININE: 1.43 mg/dL — AB (ref 0.76–1.27)
Calcium: 9.4 mg/dL (ref 8.6–10.2)
Chloride: 93 mmol/L — ABNORMAL LOW (ref 96–106)
GFR, EST AFRICAN AMERICAN: 60 mL/min/{1.73_m2} (ref >59)
GFR, EST NON AFRICAN AMERICAN: 52 mL/min/{1.73_m2} — AB (ref >59)
Glucose: 120 mg/dL — ABNORMAL HIGH (ref 65–99)
POTASSIUM: 4.9 mmol/L (ref 3.5–5.2)
Sodium: 134 mmol/L (ref 134–144)

## 2015-12-06 ENCOUNTER — Encounter: Payer: Managed Care, Other (non HMO) | Admitting: Nurse Practitioner

## 2015-12-10 ENCOUNTER — Other Ambulatory Visit: Payer: Self-pay | Admitting: Cardiothoracic Surgery

## 2015-12-10 DIAGNOSIS — Z951 Presence of aortocoronary bypass graft: Secondary | ICD-10-CM

## 2015-12-11 ENCOUNTER — Ambulatory Visit
Admission: RE | Admit: 2015-12-11 | Discharge: 2015-12-11 | Disposition: A | Discharge status: Home / Self Care | Payer: Managed Care, Other (non HMO) | Source: Ambulatory Visit | Attending: Cardiothoracic Surgery | Admitting: Cardiothoracic Surgery

## 2015-12-11 ENCOUNTER — Encounter: Payer: Self-pay | Admitting: Cardiothoracic Surgery

## 2015-12-11 ENCOUNTER — Ambulatory Visit (INDEPENDENT_AMBULATORY_CARE_PROVIDER_SITE_OTHER): Payer: Self-pay | Admitting: Cardiothoracic Surgery

## 2015-12-11 ENCOUNTER — Ambulatory Visit: Payer: Managed Care, Other (non HMO) | Admitting: Cardiothoracic Surgery

## 2015-12-11 VITALS — BP 116/81 | HR 90 | Temp 96.6°F | Resp 18 | Ht 67.0 in | Wt 187.0 lb

## 2015-12-11 DIAGNOSIS — Z951 Presence of aortocoronary bypass graft: Secondary | ICD-10-CM

## 2015-12-11 NOTE — Progress Notes (Signed)
PCP is [name], MD Referring Provider is [name], MD  Chief Complaint  Patient presents with  . Routine Post Op    f/u from surgery with CXR s/p CABG x 4 on 11/14/15    HPI: 1 month follow-up after urgent CABG 4 Patient initially presented with biliary sepsis and a cystic duct stone. He had a non-ST elevation MI followed by cardiac cath. This showed severe three-vessel CAD The patient was treated with a gallbladder drain by IR followed by CABG. The patient is done well. He still has a gallbladder drain and is followed by interventional radiology and his Gen. surgeon Dr. Dalbert Batman. The patient eyes any recurrent angina. Surgical incision is well-healed. Patient is walking 10-15 minutes daily. Today's chest x-ray is clear.  Past Medical History:  Diagnosis Date  . Acute renal failure ([place]) 11/06/2015   [name] 11/06/2015  . Anxiety   . Arthritis    "lower spine; fingers" (11/07/2015)  . Depression   . GERD (gastroesophageal reflux disease)   . High cholesterol   . Hypertension 10/17/2013  . Hyperthyroidism    "had it radiated in his '45s"  . Hypothyroidism   . New onset atrial fibrillation ([place]) 11/06/2015   [name] 11/07/2015  . NSTEMI (non-ST elevated myocardial infarction) ([place]) 11/07/2015  . Thyroid disease   . Type II diabetes mellitus ([place])     Past Surgical History:  Procedure Laterality Date  . CARDIAC CATHETERIZATION Right 11/06/2015   Procedure: Left Heart Cath and Coronary Angiography;  Surgeon: Minna Merritts, MD;  Location: [facility];  Service: Cardiovascular;  Laterality: Right;  . COLONOSCOPY    . CORONARY ARTERY BYPASS GRAFT N/A 11/14/2015   Procedure: CORONARY ARTERY BYPASS GRAFTING (CABG) x 4;  Surgeon: Ivin Poot, MD;  Location: [facility];  Service: Open Heart Surgery;  Laterality: N/A;  . ENDARTERECTOMY Left 11/14/2015   Procedure: LEFT ENDARTERECTOMY CAROTID;  Surgeon: Waynetta Sandy, MD;  Location: [facility];  Service: Vascular;   Laterality: Left;  . FINGER GANGLION CYST EXCISION Left X 3   "index finger X 2; thumb X 1"  . [place]  . HERNIA REPAIR    . INSERTION OF MESH N/A 11/16/2013   Procedure: INSERTION OF MESH;  Surgeon: Imogene Burn. Georgette Dover, MD;  Location: [facility];  Service: General;  Laterality: N/A;  . IR GENERIC HISTORICAL  11/08/2015   IR PERC CHOLECYSTOSTOMY 11/08/2015 Corrie Mckusick, DO [facility]-INTER[facility]  . TEE WITHOUT CARDIOVERSION N/A 11/14/2015   Procedure: TRANSESOPHAGEAL ECHOCARDIOGRAM (TEE);  Surgeon: Ivin Poot, MD;  Location: [facility];  Service: Open Heart Surgery;  Laterality: N/A;  . UMBILICAL HERNIA REPAIR N/A 11/16/2013   Procedure: UMBILICAL HERNIA REPAIR;  Surgeon: Imogene Burn. Georgette Dover, MD;  Location: [place];  Service: General;  Laterality: N/A;  . VEIN HARVEST Right 11/14/2015   Procedure: RIGHT LEG GREATER SAPHENOUS VEIN HARVEST;  Surgeon: Ivin Poot, MD;  Location: [facility];  Service: Open Heart Surgery;  Laterality: Right;    Family History  Problem Relation Age of Onset  . Cancer Mother     colon  . Hypertension Mother   . Diabetes Mother   . Depression Mother   . Colon cancer Mother   . Hyperthyroidism Father   . Hypertension Father   . Cancer Father     prostate  . Diabetes Maternal Grandmother   . Cancer Maternal Grandmother     lung, non smoker    Social History Social History  Substance Use Topics  . Smoking status: Current Every Day Smoker    Packs/day: 0.12    Years: 48.00    Types: Cigarettes  . Smokeless tobacco: Never Used     Comment: 11/07/2015 "I've never inhaled"  . Alcohol use 34.2 oz/week    36 Cans of beer, 21 Shots of liquor per week     Comment: 8/17.2017 "last drink was 11/03/2015 which I threw out"    Current Outpatient Prescriptions  Medication Sig Dispense Refill  . amiodarone (PACERONE) 200 MG tablet Take 1 tablet (200 mg total) by mouth daily. 30 tablet 1  . aspirin EC 325 MG EC tablet Take 1 tablet (325 mg total) by mouth daily.    Marland Kitchen  atorvastatin (LIPITOR) 40 MG tablet Take 1 tablet (40 mg total) by mouth daily. 90 tablet 3  . escitalopram (LEXAPRO) 10 MG tablet Take 1 tablet (10 mg total) by mouth daily. 30 tablet 0  . ferrous sulfate 325 (65 FE) MG tablet Take 1 tablet (325 mg total) by mouth daily with breakfast. 30 tablet 0  . folic acid (FOLVITE) 1 MG tablet Take 1 tablet (1 mg total) by mouth daily. 30 tablet 0  . furosemide (LASIX) 20 MG tablet Take 1 tablet (20 mg total) by mouth daily. 90 tablet 3  . HYDROcodone-homatropine (HYCODAN) 5-1.5 MG/5ML syrup 1 TSP PO Q 4-6 HOURS PRN COUGH 120 mL 0  . levothyroxine (SYNTHROID, LEVOTHROID) 100 MCG tablet Take 1 tablet (100 mcg total) by mouth daily before breakfast. OFFICE VISIT WITH  LABS REQUIRED FOR ADDITIONAL REFILLS 30 tablet 0  . metFORMIN (GLUCOPHAGE) 500 MG tablet Take 500 mg by mouth 2 (two) times daily with a meal.    . metoprolol tartrate (LOPRESSOR) 25 MG tablet Take 0.5 tablets (12.5 mg total) by mouth 2 (two) times daily. 60 tablet 1  . Multiple Vitamins-Minerals (MULTIVITAMIN) tablet Take 1 tablet by mouth daily. 30 tablet 0  . omeprazole (PRILOSEC) 20 MG capsule Take 1 capsule (20 mg total) by mouth daily. OFFICE VISIT REQUIRED FOR ADDITIONAL REFILLS 30 capsule 0  . potassium chloride (K-DUR) 10 MEQ tablet Take 1 tablet (10 mEq total) by mouth daily. 90 tablet 3  . Probiotic Product (PROBIOTIC PO) Take by mouth.    . mometasone-formoterol (DULERA) 200-5 MCG/ACT AERO Inhale 2 puffs into the lungs 2 (two) times daily. (Patient not taking: Reported on 12/11/2015) 1 Inhaler 1  . oxyCODONE (OXY IR/ROXICODONE) 5 MG immediate release tablet Take 1-2 tablets (5-10 mg total) by mouth every 6 (six) hours as needed for severe pain. (Patient not taking: Reported on 12/11/2015) 30 tablet 0   No current facility-administered medications for this visit.     Allergies  Allergen Reactions  . Penicillins Nausea And Vomiting and Other (See Comments)    REACTION: Vomiting Has  patient had a PCN reaction causing immediate rash, facial/tongue/throat swelling, SOB or lightheadedness with hypotension: Yes Has patient had a PCN reaction causing severe rash involving mucus membranes or skin necrosis: No Has patient had a PCN reaction that required hospitalization No Has patient had a PCN reaction occurring within the last 10 years: No If all of the above answers are "NO", then may proceed with Cephalosporin use.  . Bupropion Hcl     REACTION: Severe constipation, insomnia    Review of Systems   Patient is not resumed smoking or drinking since discharge following surgery He denies any fever or sternal instability Gallbladder drain has been secure over the past 4  weeks. No abdominal pain Poor appetite, bad taste in mouth probably from amiodarone BP 116/81   Pulse 90   Temp (!) 96.6 F (35.9 C) (Oral)   Resp 18 Comment: RA  Ht _0  (1.702 m)   Wt 187 lb (84.8 kg)   SpO2 98% Comment: RA  BMI 29.29 kg/m  Physical Exam      Exam    General- alert and comfortable   Lungs- clear without rales, wheezes   Cor- regular rate and rhythm, no murmur , gallop   Abdomen- soft, non-tender   Extremities - warm, non-tender, minimal edema   Neuro- oriented, appropriate, no focal weakness  Diagnostic Tests: Chest x-ray personally reviewed and is clear  Impression: Patient may drive and lift up to 20 pounds Patient will stop taking amiodarone as he has maintained sinus rhythm for 6 weeks Patient was commended for having stop smoking completely as well as alcohol abstinence Plan: Return for monitoring of progress in 4 weeks.  Len Childs, MD [facility] [phone]

## 2015-12-12 ENCOUNTER — Ambulatory Visit
Admission: RE | Admit: 2015-12-12 | Discharge: 2015-12-12 | Disposition: A | Discharge status: Home / Self Care | Payer: Managed Care, Other (non HMO) | Source: Ambulatory Visit | Attending: Surgery | Admitting: Surgery

## 2015-12-12 ENCOUNTER — Other Ambulatory Visit (HOSPITAL_COMMUNITY): Payer: Self-pay | Admitting: Interventional Radiology

## 2015-12-12 DIAGNOSIS — K819 Cholecystitis, unspecified: Secondary | ICD-10-CM

## 2015-12-12 HISTORY — PX: IR GENERIC HISTORICAL: IMG1180011

## 2015-12-12 NOTE — OR Nursing (Signed)
Called pt to review pre procedure instruction, left message to call back

## 2015-12-12 NOTE — Progress Notes (Signed)
Patient ID: Brian Casey, male   DOB: 1950/06/06, 65 y.o.   MRN: [id number]       Chief Complaint: Acute cholecystitis, status post percutaneous cholecystostomy. Outpatient follow-up.   Referring Physician(s): Lisabeth Register  History of Present Illness: Brian Casey is a 65 y.o. male who originally presented with biliary sepsis and acute cholecystitis. During the workup for cholecystectomy, he was found to have three-vessel coronary disease. This required  bypass. Prior to the bypass, the patient had a percutaneous cholecystostomy inserted. Following the procedure. He underwent four-vessel coronary bypass successfully. He is now recovering well as an outpatient and He returns for drain evaluation.  Past Medical History:  Diagnosis Date  . Acute renal failure ([place]) 11/06/2015   [name] 11/06/2015  . Anxiety   . Arthritis    "lower spine; fingers" (11/07/2015)  . Depression   . GERD (gastroesophageal reflux disease)   . High cholesterol   . Hypertension 10/17/2013  . Hyperthyroidism    "had it radiated in his '12s"  . Hypothyroidism   . New onset atrial fibrillation ([place]) 11/06/2015   [name] 11/07/2015  . NSTEMI (non-ST elevated myocardial infarction) ([place]) 11/07/2015  . Thyroid disease   . Type II diabetes mellitus ([place])     Past Surgical History:  Procedure Laterality Date  . CARDIAC CATHETERIZATION Right 11/06/2015   Procedure: Left Heart Cath and Coronary Angiography;  Surgeon: Minna Merritts, MD;  Location: [facility];  Service: Cardiovascular;  Laterality: Right;  . COLONOSCOPY    . CORONARY ARTERY BYPASS GRAFT N/A 11/14/2015   Procedure: CORONARY ARTERY BYPASS GRAFTING (CABG) x 4;  Surgeon: Ivin Poot, MD;  Location: [facility];  Service: Open Heart Surgery;  Laterality: N/A;  . ENDARTERECTOMY Left 11/14/2015   Procedure: LEFT ENDARTERECTOMY CAROTID;  Surgeon: Waynetta Sandy, MD;  Location: [facility];  Service: Vascular;  Laterality: Left;  . FINGER  GANGLION CYST EXCISION Left X 3   "index finger X 2; thumb X 1"  . [place]  . HERNIA REPAIR    . INSERTION OF MESH N/A 11/16/2013   Procedure: INSERTION OF MESH;  Surgeon: Imogene Burn. Georgette Dover, MD;  Location: [facility];  Service: General;  Laterality: N/A;  . IR GENERIC HISTORICAL  11/08/2015   IR PERC CHOLECYSTOSTOMY 11/08/2015 Corrie Mckusick, DO [facility]  . TEE WITHOUT CARDIOVERSION N/A 11/14/2015   Procedure: TRANSESOPHAGEAL ECHOCARDIOGRAM (TEE);  Surgeon: Ivin Poot, MD;  Location: [facility];  Service: Open Heart Surgery;  Laterality: N/A;  . UMBILICAL HERNIA REPAIR N/A 11/16/2013   Procedure: UMBILICAL HERNIA REPAIR;  Surgeon: Imogene Burn. Georgette Dover, MD;  Location: [facility];  Service: General;  Laterality: N/A;  . VEIN HARVEST Right 11/14/2015   Procedure: RIGHT LEG GREATER SAPHENOUS VEIN HARVEST;  Surgeon: Ivin Poot, MD;  Location: [facility];  Service: Open Heart Surgery;  Laterality: Right;    Allergies: Penicillins and Bupropion hcl  Medications: Prior to Admission medications   Medication Sig Start Date End Date Taking? Authorizing Provider  amiodarone (PACERONE) 200 MG tablet Take 1 tablet (200 mg total) by mouth daily. 11/21/15   John Giovanni, PA-C  aspirin EC 325 MG EC tablet Take 1 tablet (325 mg total) by mouth daily. 11/21/15   Wayne E Gold, PA-C  atorvastatin (LIPITOR) 40 MG tablet Take 1 tablet (40 mg total) by mouth daily. 11/29/15 02/27/16  Ryan M Dunn, PA-C  escitalopram (LEXAPRO) 10 MG tablet Take 1 tablet (10 mg total) by  mouth daily. 11/07/15   Bettey Costa, MD  ferrous sulfate 325 (65 FE) MG tablet Take 1 tablet (325 mg total) by mouth daily with breakfast. 11/22/15   Ivin Poot, MD  folic acid (FOLVITE) 1 MG tablet Take 1 tablet (1 mg total) by mouth daily. 11/22/15 12/22/15  Ivin Poot, MD  furosemide (LASIX) 20 MG tablet Take 1 tablet (20 mg total) by mouth daily. 11/28/15 02/26/16  Ryan M Dunn, PA-C  HYDROcodone-homatropine (HYCODAN) 5-1.5 MG/5ML syrup 1 TSP PO Q  4-6 HOURS PRN COUGH 11/28/15   Rise Mu, PA-C  levothyroxine (SYNTHROID, LEVOTHROID) 100 MCG tablet Take 1 tablet (100 mcg total) by mouth daily before breakfast. OFFICE VISIT WITH  LABS REQUIRED FOR ADDITIONAL REFILLS 10/07/15   Lucille Passy, MD  metFORMIN (GLUCOPHAGE) 500 MG tablet Take 500 mg by mouth 2 (two) times daily with a meal.    Historical Provider, MD  metoprolol tartrate (LOPRESSOR) 25 MG tablet Take 0.5 tablets (12.5 mg total) by mouth 2 (two) times daily. 11/28/15   Ryan M Dunn, PA-C  mometasone-formoterol (DULERA) 200-5 MCG/ACT AERO Inhale 2 puffs into the lungs 2 (two) times daily. Patient not taking: Reported on 12/11/2015 11/22/15   Ivin Poot, MD  Multiple Vitamins-Minerals (MULTIVITAMIN) tablet Take 1 tablet by mouth daily. 11/22/15   Ivin Poot, MD  omeprazole (PRILOSEC) 20 MG capsule Take 1 capsule (20 mg total) by mouth daily. OFFICE VISIT REQUIRED FOR ADDITIONAL REFILLS 10/07/15   Lucille Passy, MD  oxyCODONE (OXY IR/ROXICODONE) 5 MG immediate release tablet Take 1-2 tablets (5-10 mg total) by mouth every 6 (six) hours as needed for severe pain. Patient not taking: Reported on 12/11/2015 11/21/15   John Giovanni, PA-C  potassium chloride (K-DUR) 10 MEQ tablet Take 1 tablet (10 mEq total) by mouth daily. 11/28/15 02/26/16  Areta Haber Dunn, PA-C  Probiotic Product (PROBIOTIC PO) Take by mouth.    Historical Provider, MD     Family History  Problem Relation Age of Onset  . Cancer Mother     colon  . Hypertension Mother   . Diabetes Mother   . Depression Mother   . Colon cancer Mother   . Hyperthyroidism Father   . Hypertension Father   . Cancer Father     prostate  . Diabetes Maternal Grandmother   . Cancer Maternal Grandmother     lung, non smoker    Social History   Social History  . Marital status: Married    Spouse name: N/A  . Number of children: 2  . Years of education: N/A   Occupational History  . Product Engineer-terminated, Museum/gallery conservator    Social History Main Topics  . Smoking status: Current Every Day Smoker    Packs/day: 0.12    Years: 48.00    Types: Cigarettes  . Smokeless tobacco: Never Used     Comment: 11/07/2015 "I've never inhaled"  . Alcohol use 34.2 oz/week    36 Cans of beer, 21 Shots of liquor per week     Comment: 8/17.2017 "last drink was 11/03/2015 which I threw out"  . Drug use: No  . Sexual activity: Yes   Other Topics Concern  . Not on file   Social History Narrative  . No narrative on file      Review of Systems: A 12 point ROS discussed and pertinent positives are indicated in the HPI above.  All other systems are negative.  Review of Systems  Constitutional: Negative for chills, diaphoresis, fatigue, fever and unexpected weight change.  Respiratory: Negative for chest tightness and shortness of breath.   Cardiovascular: Negative for chest pain, palpitations and leg swelling.  Gastrointestinal: Negative for abdominal pain, nausea and vomiting.    Vital Signs: There were no vitals taken for this visit.  Physical Exam  Constitutional: He appears well-developed and well-nourished. No distress.  Abdominal: Soft. He exhibits no distension and no mass. There is no tenderness. There is no guarding.  Right upper quadrant cholecystostomy site is clean, dry and intact. Minimal bilious drainage of the skin site. Exudative bilious drainage in the collection bag.  Skin: He is not diaphoretic.    Imaging: Dg Chest 2 View  Result Date: 12/11/2015 CLINICAL DATA:  65 year old male with shortness of breath on exertion. CABG in August. Subsequent encounter. EXAM: CHEST  2 VIEW COMPARISON:  11/19/2015 and earlier. FINDINGS: Right PICC line removed. Right upper quadrant pigtail catheter remains in place, possibly a cholecystectomy. Small left versus bilateral pleural effusions have regressed but not resolved. Improved lung base ventilation. Stable cardiac size and mediastinal contours.  Visualized tracheal air column is within normal limits. No pneumothorax or pulmonary edema. No acute osseous abnormality identified. Calcified aortic atherosclerosis. IMPRESSION: 1. Regressed but not completely resolved small pleural effusion(s). 2. No new cardiopulmonary abnormality. Electronically Signed   By: Genevie Ann M.D.   On: 12/11/2015 14:38   Dg Chest 2 View  Result Date: 11/19/2015 CLINICAL DATA:  Cough and chest pain EXAM: CHEST  2 VIEW COMPARISON:  November 17, 2015. FINDINGS: Central catheter tip is at the cavoatrial junction. No pneumothorax. There is patchy consolidation in the medial left base with small left effusion. Right lung is clear. There is mild cardiomegaly with pulmonary vascularity within normal limits. There is atherosclerotic calcification in the aorta. No adenopathy. No bone lesions. IMPRESSION: No left base consolidation medially with left pleural effusion. Right lung clear. Stable cardiac prominence. No pneumothorax. No adenopathy. There is aortic atherosclerosis. Electronically Signed   By: Lowella Grip III M.D.   On: 11/19/2015 08:04   Ct Chest W Contrast  Result Date: 11/13/2015 CLINICAL DATA:  Shortness of breath.  COPD. EXAM: CT CHEST WITH CONTRAST TECHNIQUE: Multidetector CT imaging of the chest was performed during intravenous contrast administration. CONTRAST:  70m ISOVUE-300 IOPAMIDOL (ISOVUE-300) INJECTION 61% COMPARISON:  Chest radiograph on 11/12/2015 FINDINGS: Cardiovascular: Heart size within normal limits. Three-vessel coronary artery calcification. Aortic atherosclerosis. Right subclavian central venous catheter tip in distal SVC. Mediastinum/Nodes: No masses, pathologically enlarged lymph nodes, or other significant abnormality. Lungs/Pleura: Small pleural effusions seen bilaterally with mild dependent atelectasis. Patchy areas of airspace disease are seen in both upper lobes, most likely due to infectious or inflammatory process. No evidence of pulmonary  mass. Central tracheobronchial airways are patent. Upper Abdomen: Diffuse hepatic steatosis and minimal perihepatic ascites. Musculoskeletal: No chest wall mass or suspicious bone lesions identified. IMPRESSION: Patchy bilateral upper lobe airspace disease, most likely infectious or inflammatory in etiology. Small bilateral pleural effusions and bibasilar atelectasis. No evidence of mass or lymphadenopathy. Hepatic steatosis and minimal perihepatic ascites. Aortic atherosclerosis and three-vessel coronary artery calcification. Electronically Signed   By: JEarle GellM.D.   On: 11/13/2015 13:34   Dg Sinus/fist Tube Chk-non Gi  Result Date: 12/12/2015 CLINICAL DATA:  Acute cholecystitis, status post percutaneous cholecystostomy 11/08/2015. Outpatient follow-up. EXAM: ABSCESS INJECTION CONTRAST:  20 cc Omnipaque 300 FLUOROSCOPY TIME:  Fluoroscopy Time:  48 seconds Radiation Exposure Index (  if provided by the fluoroscopic device): 39 dGycm2 Number of Acquired Spot Images: 4 COMPARISON:  11/08/2015 FINDINGS: Under fluoroscopy, the existing percutaneous cholecystostomy was injected with contrast. Imaging performed. This demonstrates proximal cystic duct occlusion. The biliary system is not opacified. IMPRESSION: Proximal cystic duct occlusion. Electronically Signed   By: Jerilynn Mages.  Mio Schellinger M.D.   On: 12/12/2015 10:06   Dg Chest Port 1 View  Result Date: 11/17/2015 CLINICAL DATA:  CABG EXAM: PORTABLE CHEST 1 VIEW COMPARISON:  None. FINDINGS: Removal of LEFT chest tube. No pneumothorax. LEFT basilar atelectasis small effusion remains. RIGHT lung is clear. RIGHT PICC line unchanged. IMPRESSION: Removal LEFT chest tube without complication. Mild LEFT basilar atelectasis small effusion Electronically Signed   By: Suzy Bouchard M.D.   On: 11/17/2015 07:23   Dg Chest Port 1 View  Result Date: 11/16/2015 CLINICAL DATA:  Post CABG EXAM: PORTABLE CHEST 1 VIEW COMPARISON:  11/15/2015 FINDINGS: Right PICC line is in place with  the tip in the right atrium. Interval removal of Swan-Ganz catheter. Left chest tube remains in place. No pneumothorax. Prior CABG. Cardiomegaly with low lung volumes. No visible effusions. No confluent opacities. IMPRESSION: Prior CABG. Left chest tube without pneumothorax. Cardiomegaly with low lung volumes. Electronically Signed   By: Rolm Baptise M.D.   On: 11/16/2015 08:18   Dg Chest Port 1 View  Result Date: 11/15/2015 CLINICAL DATA:  Status post CABG yesterday EXAM: PORTABLE CHEST 1 VIEW COMPARISON:  Portable chest x-ray of November 14, 2015 FINDINGS: The trachea and esophagus have been extubated. The lungs are borderline to mildly hypo inflated. The heart is mildly enlarged. The central pulmonary vascularity is less engorged. There is no pneumothorax or significant pleural effusion. The mediastinal drain and left chest tube are in stable position. The Swan-Ganz catheter is stable projecting over the proximal right main pulmonary artery. The right-sided PICC line tip projects over the cavoatrial junction. The sternal wires are intact. IMPRESSION: Improved appearance of both lungs with decreased interstitial edema. Stable cardiomegaly with central pulmonary vascular prominence. No significant pleural effusion or pneumothorax. The support tubes are in stable position. Electronically Signed   By: David  Martinique M.D.   On: 11/15/2015 07:08   Dg Chest Port 1 View  Result Date: 11/14/2015 CLINICAL DATA:  Status post CABG. EXAM: PORTABLE CHEST 1 VIEW COMPARISON:  CT of the chest 11/13/2015 FINDINGS: Endotracheal tube projects 4.7 cm above the carina. Swan-Ganz catheter tip overlies the expected location of right main pulmonary trunk. Enteric catheters collimated off the image. Mediastinal and left chest tube are in place right PICC line terminates within the expected location of right atrium. Postsurgical drainage from CABG. Median sternotomy wires present. Cardiomediastinal silhouette is normal. Mediastinal  contours appear intact. There is no evidence of focal airspace consolidation, pleural effusion or pneumothorax. Mild pulmonary vascular congestion. Osseous structures are without acute abnormality. Soft tissues are grossly normal. IMPRESSION: Mild pulmonary vascular congestion. Postsurgical changes from CABG. Support apparatus as described. Electronically Signed   By: Fidela Salisbury M.D.   On: 11/14/2015 16:36    Labs:  CBC:  Recent Labs  11/15/15 1651 11/16/15 0408 11/18/15 0424 11/19/15 0514 11/28/15 1615  WBC  --  11.6* 10.2 10.2 10.0  HGB 8.8* 8.8* 9.1* 9.1*  --   HCT 26.0* 26.4* 27.7* 27.7* 34.7*  PLT  --  143* 240 252 531*    COAGS:  Recent Labs  11/04/15 0841 11/08/15 0347 11/12/15 0640 11/14/15 1600  INR 1.14 1.15 1.19 1.81  APTT  27  --   --  38*    BMP:  Recent Labs  11/18/15 0424 11/19/15 0514 11/28/15 1615 12/03/15 0951  NA 135 136 134 134  K 3.6 4.0 4.9 4.9  CL 100* 101 91* 93*  CO2 _0 GLUCOSE 95 105* 111* 120*  BUN _1 CALCIUM 8.2* 8.3* 9.6 9.4  CREATININE 1.13 1.18 1.47* 1.43*  GFRNONAA >60 >60 50* 52*  GFRAA >60 >60 58* 60    LIVER FUNCTION TESTS:  Recent Labs  11/11/15 0505 11/12/15 0640 11/13/15 0420 11/19/15 0514  BILITOT 1.0 0.6 0.8 1.2  AST 36 _2 ALT 55 39 33 19  ALKPHOS 210* 188* 187* 95  PROT 5.3* 5.9* 6.0* 5.6*  ALBUMIN 1.9* 2.8* 3.1* 2.8*    Assessment and Plan:  4 weeks status post percutaneous cholecystostomy for acute cholecystitis. Injection of the cholecystostomy confirms proximal cystic duct occlusion.  Plan: Schedule for cholecystostomy catheter exchange because of partial occlusion and leakage at the skin site.This will be scheduled in the next few days either at [facility] or [facility].  Keep to gravity drainage  Patient is scheduled for surgical follow-up in October with [facility] surgery for elective cholecystectomy.  Electronically  Signed: Greggory Keen 12/12/2015, 10:19 AM   I spent a total of  15 Minutes   in face to face in clinical consultation, greater than 50% of which was counseling/coordinating care for this patient with a cholecystostomy

## 2015-12-13 ENCOUNTER — Encounter: Payer: Self-pay | Admitting: Vascular Surgery

## 2015-12-13 ENCOUNTER — Ambulatory Visit
Admission: RE | Admit: 2015-12-13 | Discharge: 2015-12-13 | Disposition: A | Discharge status: Home / Self Care | Payer: Managed Care, Other (non HMO) | Source: Ambulatory Visit | Attending: Interventional Radiology | Admitting: Interventional Radiology

## 2015-12-13 ENCOUNTER — Encounter: Payer: Self-pay | Admitting: Cardiovascular Disease

## 2015-12-13 ENCOUNTER — Other Ambulatory Visit (HOSPITAL_COMMUNITY): Payer: Self-pay | Admitting: Interventional Radiology

## 2015-12-13 DIAGNOSIS — Z4803 Encounter for change or removal of drains: Secondary | ICD-10-CM | POA: Insufficient documentation

## 2015-12-13 DIAGNOSIS — K819 Cholecystitis, unspecified: Secondary | ICD-10-CM | POA: Diagnosis not present

## 2015-12-13 HISTORY — PX: IR GENERIC HISTORICAL: IMG1180011

## 2015-12-13 MED ORDER — IOPAMIDOL (ISOVUE-300) INJECTION 61%: 5.0000 mL | Freq: Once | INTRAVENOUS | Status: DC | PRN

## 2015-12-13 NOTE — Procedures (Signed)
Status post fluoroscopic exchange of the occluded percutaneous cholecystostomy  No complications  EBL 0  30 mL purulent bile aspirated immediately through the new tube.  Keep to external gravity drainage.   Full report in PACs.

## 2015-12-16 ENCOUNTER — Ambulatory Visit (HOSPITAL_COMMUNITY)
Admission: RE | Admit: 2015-12-16 | Discharge: 2015-12-16 | Disposition: A | Discharge status: Home / Self Care | Payer: Managed Care, Other (non HMO) | Source: Ambulatory Visit | Attending: Vascular Surgery | Admitting: Vascular Surgery

## 2015-12-16 ENCOUNTER — Other Ambulatory Visit: Payer: Self-pay | Admitting: Cardiothoracic Surgery

## 2015-12-16 ENCOUNTER — Ambulatory Visit: Payer: Managed Care, Other (non HMO)

## 2015-12-16 DIAGNOSIS — D500 Iron deficiency anemia secondary to blood loss (chronic): Secondary | ICD-10-CM

## 2015-12-16 DIAGNOSIS — I6523 Occlusion and stenosis of bilateral carotid arteries: Secondary | ICD-10-CM | POA: Diagnosis present

## 2015-12-16 DIAGNOSIS — I2510 Atherosclerotic heart disease of native coronary artery without angina pectoris: Secondary | ICD-10-CM

## 2015-12-16 LAB — VAS US CAROTID
LCCADSYS: -89 cm/s
LICADSYS: -80 cm/s
Left CCA dist dias: -13 cm/s
Left CCA prox dias: 12 cm/s
Left CCA prox sys: 84 cm/s
Left ICA dist dias: -24 cm/s
Left ICA prox dias: -10 cm/s
Left ICA prox sys: -37 cm/s
RCCADSYS: -88 cm/s
RCCAPDIAS: 12 cm/s
RIGHT CCA MID DIAS: -16 cm/s
Right CCA prox sys: 68 cm/s

## 2015-12-18 ENCOUNTER — Other Ambulatory Visit: Payer: Self-pay | Admitting: Cardiothoracic Surgery

## 2015-12-18 ENCOUNTER — Other Ambulatory Visit: Payer: Managed Care, Other (non HMO)

## 2015-12-18 ENCOUNTER — Ambulatory Visit: Payer: Managed Care, Other (non HMO) | Admitting: Cardiothoracic Surgery

## 2015-12-18 ENCOUNTER — Encounter: Payer: Self-pay | Admitting: Physician Assistant

## 2015-12-18 ENCOUNTER — Other Ambulatory Visit (HOSPITAL_COMMUNITY): Payer: Self-pay | Admitting: Surgical

## 2015-12-18 DIAGNOSIS — I2510 Atherosclerotic heart disease of native coronary artery without angina pectoris: Secondary | ICD-10-CM

## 2015-12-18 DIAGNOSIS — D500 Iron deficiency anemia secondary to blood loss (chronic): Secondary | ICD-10-CM

## 2015-12-19 ENCOUNTER — Other Ambulatory Visit: Payer: Self-pay | Admitting: Cardiothoracic Surgery

## 2015-12-19 ENCOUNTER — Other Ambulatory Visit (HOSPITAL_COMMUNITY): Payer: Self-pay | Admitting: Surgical

## 2015-12-19 DIAGNOSIS — D500 Iron deficiency anemia secondary to blood loss (chronic): Secondary | ICD-10-CM

## 2015-12-19 DIAGNOSIS — I2510 Atherosclerotic heart disease of native coronary artery without angina pectoris: Secondary | ICD-10-CM

## 2015-12-20 ENCOUNTER — Encounter: Payer: Self-pay | Admitting: Cardiothoracic Surgery

## 2015-12-20 ENCOUNTER — Ambulatory Visit (INDEPENDENT_AMBULATORY_CARE_PROVIDER_SITE_OTHER): Payer: Self-pay | Admitting: Vascular Surgery

## 2015-12-20 VITALS — BP 102/67 | HR 80 | Temp 96.7°F | Resp 16 | Ht 67.0 in | Wt 176.8 lb

## 2015-12-20 DIAGNOSIS — I6522 Occlusion and stenosis of left carotid artery: Secondary | ICD-10-CM

## 2015-12-20 NOTE — Progress Notes (Signed)
Subjective:     Patient ID: Brian Casey, male   DOB: July 07, 1950, 65 y.o.   MRN: [id number]  HPI Mr. Skibicki follows up from recent concomitant carotid CABG procedure. He is doing very well other than some persistent hoarseness of his voice and has had some difficulty swallowing thick foods. He is otherwise without neurologic symptoms and is progressing well. Of note he has quit smoking.   Review of Systems  Constitutional: Negative.   Respiratory: Negative.   Cardiovascular: Negative.   Gastrointestinal:       Biliary drain       Objective:   Physical Exam  Constitutional: He is oriented to person, place, and time. He appears well-developed.  Cardiovascular: Normal rate.   Pulmonary/Chest: Effort normal.  Musculoskeletal: Normal range of motion. He exhibits no edema.  Neurological: He is alert and oriented to person, place, and time. No cranial nerve deficit.       Assessment:     65 year old white male here for follow-up from recent left carotid endarterectomy was performed for asymptomatic disease along with a CABG. He is progressing very well still has his biliary drain from his biliary sepsis and will require surgical intervention. He is seen Dr. Prescott Gum who is following him closely.    Plan:    progressing well from carotid endarterectomy standpoint. He will follow up in 6 months with repeat carotid duplex.cc   Disha Cottam C. Donzetta Matters, MD [facility] Office: [phone] Pager: [phone]   .b

## 2015-12-23 ENCOUNTER — Other Ambulatory Visit (HOSPITAL_COMMUNITY): Payer: Self-pay | Admitting: General Surgery

## 2015-12-23 ENCOUNTER — Other Ambulatory Visit: Payer: Self-pay | Admitting: General Surgery

## 2015-12-23 DIAGNOSIS — K819 Cholecystitis, unspecified: Secondary | ICD-10-CM

## 2015-12-24 ENCOUNTER — Telehealth: Payer: Self-pay | Admitting: Cardiovascular Disease

## 2015-12-24 NOTE — Telephone Encounter (Signed)
Received cardiac clearance request for pt to proceed w/ laparoscopic cholecystectomy, possible open, in the near future, under general anesthesia.  The pt does not have a surgery date scheduled yet b/c he will require written clearance in order to get a surgery date.   Pt is currently on Xarelto, so they need instructions on how to hold preoperatively. Please route clearance & recommendations to [facility] @ [phone], Attn: Yehuda Mao, RMA.

## 2015-12-24 NOTE — Addendum Note (Signed)
Addended by: Kaleen Mask on: 12/24/2015 02:15 PM   Modules accepted: Orders

## 2015-12-25 ENCOUNTER — Ambulatory Visit (HOSPITAL_COMMUNITY)
Admission: RE | Admit: 2015-12-25 | Discharge: 2015-12-25 | Disposition: A | Discharge status: Home / Self Care | Payer: Managed Care, Other (non HMO) | Source: Ambulatory Visit | Attending: General Surgery | Admitting: General Surgery

## 2015-12-25 ENCOUNTER — Encounter (HOSPITAL_COMMUNITY): Payer: Self-pay | Admitting: Interventional Radiology

## 2015-12-25 DIAGNOSIS — T85638A Leakage of other specified internal prosthetic devices, implants and grafts, initial encounter: Secondary | ICD-10-CM | POA: Insufficient documentation

## 2015-12-25 DIAGNOSIS — K8020 Calculus of gallbladder without cholecystitis without obstruction: Secondary | ICD-10-CM | POA: Insufficient documentation

## 2015-12-25 DIAGNOSIS — K819 Cholecystitis, unspecified: Secondary | ICD-10-CM

## 2015-12-25 DIAGNOSIS — Y828 Other medical devices associated with adverse incidents: Secondary | ICD-10-CM | POA: Insufficient documentation

## 2015-12-25 HISTORY — PX: IR GENERIC HISTORICAL: IMG1180011

## 2015-12-25 MED ORDER — IOPAMIDOL (ISOVUE-300) INJECTION 61%
INTRAVENOUS | Status: AC
  Administered 2015-12-25: 10 mL
  Filled 2015-12-25: qty 50

## 2015-12-25 MED ORDER — LIDOCAINE HCL 1 % IJ SOLN
INTRAMUSCULAR | Status: AC
  Filled 2015-12-25: qty 20

## 2015-12-25 NOTE — Telephone Encounter (Signed)
Acceptable risk for surgery No further testing needed Would stop xarelto 2 days prior to surgery

## 2015-12-25 NOTE — Procedures (Signed)
Technically successful fluoro guided chole tube exchange and upsizing to 12 Fr.  EBL: None No immediate post procedural complications.  Pt instructed to maintain chole tube to gravity bag and flush drain 2x/day.  Ronny Bacon, MD Pager #: [phone]

## 2015-12-26 NOTE — Telephone Encounter (Signed)
Routed to fax # provided.

## 2015-12-30 ENCOUNTER — Telehealth: Payer: Self-pay | Admitting: *Deleted

## 2015-12-30 NOTE — Telephone Encounter (Signed)
Patient is cleared for gallbladder surgery per Dr. Prescott Gum.  We will see him back as scheduled on 01/15/16 for routine post-op.  Robin with [facility] will call me back if we need to move that appt due to timing of surgery.

## 2016-01-08 ENCOUNTER — Ambulatory Visit: Payer: Managed Care, Other (non HMO) | Admitting: General Surgery

## 2016-01-14 NOTE — Pre-Procedure Instructions (Signed)
Brian Casey  01/14/2016      [id number] [id number] [place]: [phone]: [phone] [facility] [address] [place]Phone: [phone]: [phone]   Your procedure is scheduled on Friday, October 27th, 2017.  [facility]   Call this number if you have problems the morning of surgery:  [phone]   Remember:  Do not eat food or drink liquids after midnight.   Take these medicines the morning of surgery with A SIP OF WATER: Escitalopram (Lexapro), Levothyroxine (Synthroid), Metoprolol Tartrate (Lopressor), Omeprazole (Prilosec), Dulera Inhaler if needed (Please bring inhaler with you).    WHAT DO I DO ABOUT MY DIABETES MEDICATION?  Do not take oral diabetes medicines (pills) the morning of surgery.  Do NOT take Metformin the morning of surgery.     How to Manage Your Diabetes Before and After Surgery  Why is it important to control my blood sugar before and after surgery? . Improving blood sugar levels before and after surgery helps healing and can limit problems. . A way of improving blood sugar control is eating a healthy diet by: o  Eating less sugar and carbohydrates o  Increasing activity/exercise o  Talking with your doctor about reaching your blood sugar goals . High blood sugars (greater than 180 mg/dL) can raise your risk of infections and slow your recovery, so you will need to focus on controlling your diabetes during the weeks before surgery. . Make sure that the doctor who takes care of your diabetes knows about your planned surgery including the date and location.  How do I manage my blood sugar before surgery? . Check your blood sugar at least 4 times a day, starting 2 days before surgery, to make sure that the level  is not too high or low. o Check your blood sugar the morning of your surgery when you wake up and every 2 hours until you get to the Short Stay unit. . If your blood sugar is less than 70 mg/dL, you will need to treat for low blood sugar: o Do not take insulin. o Treat a low blood sugar (less than 70 mg/dL) with  cup of clear juice (cranberry or apple), 4 glucose tablets, OR glucose gel. o Recheck blood sugar in 15 minutes after treatment (to make sure it is greater than 70 mg/dL). If your blood sugar is not greater than 70 mg/dL on recheck, call [phone] further instructions. . Report your blood sugar to the short stay nurse when you get to Short Stay.  . If you are admitted to the hospital after surgery: o Your blood sugar will be checked by the staff and you will probably be given insulin after surgery (instead of oral diabetes medicines) to make sure you have good blood sugar levels. o The goal for blood sugar control after surgery is 80-180 mg/dL.    Stop taking: Aspirin, NSAIDS, Aleve, Naproxen, Ibuprofen, Advil, Motrin, BC's, Goody's, Fish oil, all herbal medications, and all vitamins.     Do not wear jewelry.  Do not wear lotions, powders, or colognes, or deoderant.  Men may shave face and neck.  Do not bring valuables to the hospital.  [facility] any belongings or  valuables.  Contacts, dentures or bridgework may not be worn into surgery.  Leave your suitcase in the car.  After surgery it may be brought to your room.  For patients admitted to the hospital, discharge time will be determined by your treatment team.  Patients discharged the day of surgery will not be allowed to drive home.   Special instructions:  Preparing for Surgery.   [place]- Preparing For Surgery  Before surgery, you can play an important role. Because skin is not sterile, your skin needs to be as free of germs as possible. You can reduce the number of germs on your  skin by washing with CHG (chlorahexidine gluconate) Soap before surgery.  CHG is an antiseptic cleaner which kills germs and bonds with the skin to continue killing germs even after washing.  Please do not use if you have an allergy to CHG or antibacterial soaps. If your skin becomes reddened/irritated stop using the CHG.  Do not shave (including legs and underarms) for at least 48 hours prior to first CHG shower. It is OK to shave your face.  Please follow these instructions carefully.   1. Shower the NIGHT BEFORE SURGERY and the MORNING OF SURGERY with CHG.   2. If you chose to wash your hair, wash your hair first as usual with your normal shampoo.  3. After you shampoo, rinse your hair and body thoroughly to remove the shampoo.  4. Use CHG as you would any other liquid soap. You can apply CHG directly to the skin and wash gently with a scrungie or a clean washcloth.   5. Apply the CHG Soap to your body ONLY FROM THE NECK DOWN.  Do not use on open wounds or open sores. Avoid contact with your eyes, ears, mouth and genitals (private parts). Wash genitals (private parts) with your normal soap.  6. Wash thoroughly, paying special attention to the area where your surgery will be performed.  7. Thoroughly rinse your body with warm water from the neck down.  8. DO NOT shower/wash with your normal soap after using and rinsing off the CHG Soap.  9. Pat yourself dry with a CLEAN TOWEL.   10. Wear CLEAN PAJAMAS   11. Place CLEAN SHEETS on your bed the night of your first shower and DO NOT SLEEP WITH PETS.  Day of Surgery: Do not apply any deodorants/lotions. Please wear clean clothes to the hospital/surgery center.     Please read over the following fact sheets that you were given.

## 2016-01-15 ENCOUNTER — Encounter (HOSPITAL_COMMUNITY): Payer: Self-pay

## 2016-01-15 ENCOUNTER — Ambulatory Visit (INDEPENDENT_AMBULATORY_CARE_PROVIDER_SITE_OTHER): Payer: Self-pay | Admitting: Physician Assistant

## 2016-01-15 ENCOUNTER — Encounter (HOSPITAL_COMMUNITY)
Admission: RE | Admit: 2016-01-15 | Discharge: 2016-01-15 | Disposition: A | Discharge status: Home / Self Care | Payer: Managed Care, Other (non HMO) | Source: Ambulatory Visit | Attending: General Surgery | Admitting: General Surgery

## 2016-01-15 ENCOUNTER — Encounter: Payer: Self-pay | Admitting: Cardiothoracic Surgery

## 2016-01-15 VITALS — BP 114/68 | HR 75 | Resp 20 | Ht 67.0 in | Wt 176.0 lb

## 2016-01-15 DIAGNOSIS — K8012 Calculus of gallbladder with acute and chronic cholecystitis without obstruction: Secondary | ICD-10-CM | POA: Insufficient documentation

## 2016-01-15 DIAGNOSIS — D62 Acute posthemorrhagic anemia: Secondary | ICD-10-CM | POA: Diagnosis not present

## 2016-01-15 DIAGNOSIS — Z7984 Long term (current) use of oral hypoglycemic drugs: Secondary | ICD-10-CM | POA: Diagnosis not present

## 2016-01-15 DIAGNOSIS — E785 Hyperlipidemia, unspecified: Secondary | ICD-10-CM | POA: Diagnosis not present

## 2016-01-15 DIAGNOSIS — Z951 Presence of aortocoronary bypass graft: Secondary | ICD-10-CM

## 2016-01-15 DIAGNOSIS — E1122 Type 2 diabetes mellitus with diabetic chronic kidney disease: Secondary | ICD-10-CM | POA: Insufficient documentation

## 2016-01-15 DIAGNOSIS — Z888 Allergy status to other drugs, medicaments and biological substances status: Secondary | ICD-10-CM | POA: Insufficient documentation

## 2016-01-15 DIAGNOSIS — M1990 Unspecified osteoarthritis, unspecified site: Secondary | ICD-10-CM | POA: Insufficient documentation

## 2016-01-15 DIAGNOSIS — Z880 Allergy status to penicillin: Secondary | ICD-10-CM | POA: Insufficient documentation

## 2016-01-15 DIAGNOSIS — I252 Old myocardial infarction: Secondary | ICD-10-CM | POA: Diagnosis not present

## 2016-01-15 DIAGNOSIS — Z87891 Personal history of nicotine dependence: Secondary | ICD-10-CM | POA: Insufficient documentation

## 2016-01-15 DIAGNOSIS — I480 Paroxysmal atrial fibrillation: Secondary | ICD-10-CM | POA: Diagnosis not present

## 2016-01-15 DIAGNOSIS — E039 Hypothyroidism, unspecified: Secondary | ICD-10-CM | POA: Diagnosis not present

## 2016-01-15 DIAGNOSIS — I2510 Atherosclerotic heart disease of native coronary artery without angina pectoris: Secondary | ICD-10-CM | POA: Insufficient documentation

## 2016-01-15 DIAGNOSIS — K219 Gastro-esophageal reflux disease without esophagitis: Secondary | ICD-10-CM | POA: Diagnosis not present

## 2016-01-15 DIAGNOSIS — I129 Hypertensive chronic kidney disease with stage 1 through stage 4 chronic kidney disease, or unspecified chronic kidney disease: Secondary | ICD-10-CM | POA: Insufficient documentation

## 2016-01-15 DIAGNOSIS — N189 Chronic kidney disease, unspecified: Secondary | ICD-10-CM | POA: Diagnosis not present

## 2016-01-15 DIAGNOSIS — Z6827 Body mass index (BMI) 27.0-27.9, adult: Secondary | ICD-10-CM | POA: Diagnosis not present

## 2016-01-15 DIAGNOSIS — E441 Mild protein-calorie malnutrition: Secondary | ICD-10-CM | POA: Insufficient documentation

## 2016-01-15 HISTORY — DX: Presence of spectacles and contact lenses: Z97.3

## 2016-01-15 HISTORY — DX: Personal history of other diseases of the respiratory system: Z87.09

## 2016-01-15 LAB — CBC WITH DIFFERENTIAL/PLATELET
BASOS ABS: 0 10*3/uL (ref 0.0–0.1)
BASOS PCT: 0 %
Eosinophils Absolute: 0.3 10*3/uL (ref 0.0–0.7)
Eosinophils Relative: 4 %
HEMATOCRIT: 39.1 % (ref 39.0–52.0)
HEMOGLOBIN: 13.1 g/dL (ref 13.0–17.0)
Lymphocytes Relative: 16 %
Lymphs Abs: 1.2 10*3/uL (ref 0.7–4.0)
MCH: 31 pg (ref 26.0–34.0)
MCHC: 33.5 g/dL (ref 30.0–36.0)
MCV: 92.4 fL (ref 78.0–100.0)
Monocytes Absolute: 0.4 10*3/uL (ref 0.1–1.0)
Monocytes Relative: 6 %
NEUTROS ABS: 5.5 10*3/uL (ref 1.7–7.7)
NEUTROS PCT: 74 %
Platelets: 294 10*3/uL (ref 150–400)
RBC: 4.23 MIL/uL (ref 4.22–5.81)
RDW: 15.2 % (ref 11.5–15.5)
WBC: 7.5 10*3/uL (ref 4.0–10.5)

## 2016-01-15 LAB — COMPREHENSIVE METABOLIC PANEL
ALBUMIN: 3.8 g/dL (ref 3.5–5.0)
ALK PHOS: 74 U/L (ref 38–126)
ALT: 29 U/L (ref 17–63)
AST: 28 U/L (ref 15–41)
Anion gap: 10 (ref 5–15)
BILIRUBIN TOTAL: 0.6 mg/dL (ref 0.3–1.2)
BUN: 8 mg/dL (ref 6–20)
CALCIUM: 9.3 mg/dL (ref 8.9–10.3)
CO2: 27 mmol/L (ref 22–32)
CREATININE: 1.08 mg/dL (ref 0.61–1.24)
Chloride: 100 mmol/L — ABNORMAL LOW (ref 101–111)
GFR calc Af Amer: >60 mL/min (ref >60)
GFR calc non Af Amer: >60 mL/min (ref >60)
GLUCOSE: 120 mg/dL — AB (ref 65–99)
POTASSIUM: 4 mmol/L (ref 3.5–5.1)
Sodium: 137 mmol/L (ref 135–145)
TOTAL PROTEIN: 7.1 g/dL (ref 6.5–8.1)

## 2016-01-15 LAB — GLUCOSE, CAPILLARY: Glucose-Capillary: 122 mg/dL — ABNORMAL HIGH (ref 65–99)

## 2016-01-15 NOTE — Progress Notes (Signed)
Patient refused CXR today since he recently had one on 12/11/15 but patient stated that if Dr. Dalbert Batman stated it was needed, he would have it done day of surgery.  Dr. Darrel Hoover office notified and stated that they would get back to to nurse if CXR was needed.

## 2016-01-15 NOTE — Progress Notes (Signed)
Per nurse, Abigail Butts, at Dr. Darrel Hoover office, patient does not need CXR.

## 2016-01-15 NOTE — H&P (Signed)
Brian Casey Location: [facility] Patient #: [id number] DOB: 1950-08-02 Married / Language: English / Race: White Male       History of Present Illness The patient is a 65 year old male who presents with a complaint of acute cholecystitis with cholecystostomy tube in place. This is a pleasant 65 year old Caucasian male who returns to see me to discuss elective cholecystectomy.  He was transferred from [facility] on November 07, 2015 because of coronary artery disease and acute cholecystitis. He has diabetes, hypertension, hyperlipidemia, chronic alcohol and tobacco abuse. He presented at [facility] with right upper quadrant abdominal pain and had acalculous cholecystitis on exam and on CT scan. He was also found to be in atrial fibrillation with RVR. The abdominal pain had been going on for 3 days.  Ultrasound showed some wall thickening and sludge but no stones. Initial leukocytosis was 24,000 but that came down. He was placed on Zosyn. Cardiac catheterization revealed severe three-vessel coronary artery disease. He was then transferred to [facility] and he did rule in for NSTEMI. He stabilized and cardiology and cardiac surgery felt that he would need coronary artery bypass grafting. Given his imaging findings and physical exam it was fairly obvious that he had acute cholecystitis. He is felt to be a poor candidate for general anesthesia and cholecystectomy due to his coronary artery disease. We canceled a hepatobiliary scan and interventional radiology drained his gallbladder. He stabilized.  He ultimately underwent left carotid endarterectomy by Dr. Donzetta Matters and coronary artery bypass grafting 4 by Dr. Prescott Gum on August 24. He recovered. He was weaned off of insulin drip. He apparently did well from both vascular procedures. He was discharged home with a percutaneous cholecystostomy tube in place and follow-up with general  surgery.  He has resumed normal diet. He says his bowel movements are normal consistency and her on normal brown color. He has seen Dr. Prescott Gum who suggested that he should have his cholecystectomy at the end of October. He states that he has not seen his cardiologist, Dr. Ida Rogue in [place] yet. He is not on any anticoagulation other than daily aspirin.  Comorbidities include diabetes mellitus type 2, hyperlipidemia, hypertension, CKD II, alcohol and tobacco abuse, now resolved, paroxysmal atrial fibrillation, GERD, mild protein calorie malnutrition. Acute blood loss anemia. He has had an umbilical hernia repair with mesh by Dr. Georgette Dover 2-3 years ago.  He underwent injection study of his cholecystostomy tube on December 12, 2015 which showed cystic duct obstruction. The tube had to be changed on September 22 because it occluded. He says it drained for a while but now is occluded again and the fluid is coming out of his skin and nothing in the bag.  We had a long talk about cholecystectomy. I feel that the best option for him is to schedule him for elective laparoscopic cholecystectomy with cholangiogram, possible open cholecystectomy when he is cleared for surgery by his cardiac surgeon and his cardiologist. He is in full agreement. His wife is here and she agrees. We had a long talk about laparoscopic cholecystectomy with cholangiogram. I discussed the indications, details, techniques, and numerous risk with him. He is aware of the risk of bleeding, infection, increased risk of converting to open because of inflamed anatomy, bile leak, injury to adjacent organs with major reconstructive surgery, wound hernia, cardiac pulmonary and thromboembolic problems. He understands these issues well. All of his questions are answered. He agrees with this plan. I gave  him a patient information booklet and reviewed this with him personally.  The current plan is as follows: He is referred  back to interventional radiology to flush or exchanges the cholecystostomy tube now. Refer to Ida Rogue for cardiac risk assessment and clearance Request clearance from Dr. Nils Pyle to proceed with cholecystectomy as well Schedule for laparoscopic cholecystectomy with IOC at [place] [facility] in about 4 weeks, assuming that his cardiac condition is stable.. Consider optical trocar entry left subcostal region   Allergies  Penicillins BuPROPion HCl *CHEMICALS*  Medication History  Atorvastatin Calcium (40MG Tablet, Oral) Active. Metoprolol Tartrate (25MG Tablet, Oral) Active. Ferrous Sulfate (325 (65 Fe)MG Tablet, Oral) Active. Potassium Chloride (10MEQ Tablet ER, Oral) Active. Hydrocodone-Homatropine (5-1.5MG/5ML Syrup, Oral) Active. Dulera (200-5MCG/ACT Aerosol, Inhalation) Active. OxyCODONE HCl (5MG Tablet, Oral) Active. Escitalopram Oxalate (10MG Tablet, Oral) Active. MetFORMIN HCl (500MG Tablet, Oral) Active. Omeprazole (20MG Capsule DR, Oral) Active. Levothyroxine Sodium (100MCG Tablet, Oral) Active. Medications Reconciled  Vitals  Weight: 176.6 lb Height: 67in Body Surface Area: 1.92 m Body Mass Index: 27.66 kg/m  Temp.: 97.7F(Temporal)  Pulse: 100 (Regular)  BP: 132/74 (Sitting, Left Arm, Standard)   Physical Exam General Mental Status-Alert. General Appearance-Consistent with stated age. Hydration-Well hydrated. Voice-Normal.  Head and Neck Head-normocephalic, atraumatic with no lesions or palpable masses. Trachea-midline. Thyroid Gland Characteristics - normal size and consistency.  Eye Eyeball - Bilateral-Extraocular movements intact. Sclera/Conjunctiva - Bilateral-No scleral icterus.  Chest and Lung Exam Chest and lung exam reveals -quiet, even and easy respiratory effort with no use of accessory muscles and on auscultation, normal breath sounds, no adventitious sounds and normal vocal  resonance. Inspection Chest Wall - Normal. Back - normal. Note: Lungs are clear. Median sternotomy incision appears to be healing nicely. Chest tube sites epigastric area also healing normally.   Breast Breast - Left-Symmetric, Non Tender, No Biopsy scars, no Dimpling, No Inflammation, No Lumpectomy scars, No Mastectomy scars, No Peau d' Orange. Breast - Right-Symmetric, Non Tender, No Biopsy scars, no Dimpling, No Inflammation, No Lumpectomy scars, No Mastectomy scars, No Peau d' Orange. Breast Lump-No Palpable Breast Mass.  Cardiovascular Cardiovascular examination reveals -normal heart sounds, regular rate and rhythm with no murmurs and normal pedal pulses bilaterally. Note: Posterior tibial pulses palpable. No ankle edema   Abdomen Inspection Inspection of the abdomen reveals - No Hernias. Palpation/Percussion Palpation and Percussion of the abdomen reveal - Soft, Non Tender, No Rebound tenderness, No Rigidity (guarding) and No hepatosplenomegaly. Auscultation Auscultation of the abdomen reveals - Bowel sounds normal. Note: Abdomen is soft and nontender. Right upper quadrant drain with some light greenish fluid in the tubing but nothing in the bag. Appear to be sutured in place. Skin around this looks fine. Slight erythema and slight tenderness but nothing out of the ordinary. No organomegaly. No abdominal distention. Well-healed umbilical incision. No recurrence of that hernia.   Neurologic Neurologic evaluation reveals -alert and oriented x 3 with no impairment of recent or remote memory. Mental Status-Normal.  Musculoskeletal Normal Exam - Left-Upper Extremity Strength Normal and Lower Extremity Strength Normal. Normal Exam - Right-Upper Extremity Strength Normal and Lower Extremity Strength Normal.  Lymphatic Head & Neck  General Head & Neck Lymphatics: Bilateral - Description - Normal. Axillary  General Axillary Region: Bilateral - Description -  Normal. Tenderness - Non Tender. Femoral & Inguinal  Generalized Femoral & Inguinal Lymphatics: Bilateral - Description - Normal. Tenderness - Non Tender.    Assessment & Plan  ACUTE CHOLECYSTITIS WITHOUT CALCULUS (K81.0)  You seem  to be doing better now You are recovering nicely from your coronary artery bypass graft and your carotid endarterectomy The recent study of her gallbladder drain shows that the gallbladder is completely occluded  You'll be referred back to interventional radiology to exchange the drain so that it drains better We have decided to go ahead with cholecystectomy once we get clearance from Dr. Rockey Situ and from Dr. Prescott Gum. My office will check with the other 2 doctors for formal approval for the gallbladder surgery  Probably will proceed with laparoscopic cholecystectomy with cholangiogram, possible open cholecystectomy in about 4 weeks We have discussed the indications, techniques, and numerous risk of the surgery  Please read the printed information that I have given you.  Pt Education - Pamphlet Given - Laparoscopic Gallbladder Surgery: discussed with patient and provided information. The anatomy & physiology of hepatobiliary & pancreatic function was discussed. The pathophysiology of gallbladder dysfunction was discussed. Natural history risks without surgery was discussed. I feel the risks of no intervention will lead to serious problems that outweigh the operative risks; therefore, I recommended cholecystectomy to remove the pathology. I explained laparoscopic techniques with possible need for an open approach. Probable cholangiogram to evaluate the bilary tract was explained as well.  Risks such as bleeding, infection, abscess, leak, injury to other organs, need for further treatment, heart attack, death, and other risks were discussed. I noted a good likelihood this will help address the problem. Possibility that this will not correct all abdominal symptoms  was explained. Goals of post-operative recovery were discussed as well. We will work to minimize complications. An educational handout further explaining the pathology and treatment options was given as well. Questions were answered. The patient expresses understanding & wishes to proceed with surgery.  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 2015. Dr. Karilyn Cota M. Dalbert Batman, M.D., [facility] General and Minimally invasive Surgery Breast and Colorectal Surgery Office:   [phone] Pager:   [phone]

## 2016-01-15 NOTE — Progress Notes (Signed)
HPI:  Patient returns for routine follow up.  He is S/P Left Carotid Endarterectomy and CABG x 4 on 11/14/2015.  He was last seen by Dr. Prescott Gum on 12/11/2015 at which point he was doing fairly well.  He was taken off Amiodarone as he was maintaining NSR.  He presents today and states he continues to make progress.  He states that his numbness across his chest has significantly improved and is now mainly along the incision.  He does notice that he has some mild soreness with deep inspiration, that does not require pain medication.  He states that his left neck site is infected.  He states that his wife has been able to express some puss from it and they have seen evidence of stiches present.  He states he is ambulating, but when he stands up he gets dizzy and has to stabilize himself for several minutes prior to being able to ambulate.  He still has a biliary drain in place and is scheduled to have his gall bladder removed this Friday 01/17/2016.  Current Outpatient Prescriptions  Medication Sig Dispense Refill  . atorvastatin (LIPITOR) 40 MG tablet Take 1 tablet (40 mg total) by mouth daily. (Patient taking differently: Take 40 mg by mouth every evening. ) 90 tablet 3  . escitalopram (LEXAPRO) 10 MG tablet Take 1 tablet (10 mg total) by mouth daily. 30 tablet 0  . levothyroxine (SYNTHROID, LEVOTHROID) 100 MCG tablet Take 1 tablet (100 mcg total) by mouth daily before breakfast. OFFICE VISIT WITH  LABS REQUIRED FOR ADDITIONAL REFILLS 30 tablet 0  . metFORMIN (GLUCOPHAGE) 500 MG tablet Take 500 mg by mouth 2 (two) times daily with a meal.    . metoprolol tartrate (LOPRESSOR) 25 MG tablet Take 0.5 tablets (12.5 mg total) by mouth 2 (two) times daily. 60 tablet 1  . mometasone-formoterol (DULERA) 200-5 MCG/ACT AERO Inhale 2 puffs into the lungs 2 (two) times daily. (Patient taking differently: Inhale 2 puffs into the lungs 2 (two) times daily as needed for wheezing or shortness of breath (Prn only). ) 1  Inhaler 1  . omeprazole (PRILOSEC) 20 MG capsule Take 1 capsule (20 mg total) by mouth daily. OFFICE VISIT REQUIRED FOR ADDITIONAL REFILLS 30 capsule 0   No current facility-administered medications for this visit.     Physical Exam:  BP 114/68 (BP Location: Left Arm, Patient Position: Sitting, Cuff Size: Normal)   Pulse 75   Resp 20   Ht _0  (1.702 m)   Wt 176 lb (79.8 kg)   SpO2 97%   BMI 27.57 kg/m   Gen: no apparent distress Heart: RRR Lungs: CTA bilaterally Abd: soft, non-tender, biliary drain remains in place Ext: no edema present Incisions: sternotomy, EVH sites well healed... His L carotid endarterectomy site is also well healed.  There is a small bump along the inferior portion of the incision.  This is not red and no purulence is noted  A/P:  1. CV- NSR, BP in the 110s- dizziness could be attributed to Lasix use.  He has no edema on exam today.  I will discontinue Lasix and potassium today.  We will continue Lopressor for now... However if symptoms dont improve and his BP remains on the low side he may ultimately need to stop his BB... He is scheduled to see Dr. Rockey Situ in December and I will defer this decision to him 2. Left Carotid Endarterectomy incision- well healed, no acute evidence of infection... There is a small bump  at inferior portion which could be a stitch trying to be expelled.  Instructed patient to contact vascular surgery or our office the next time he has purulent drainage present and stitch present and we can remove it that time 3. Parasthesia- improving across chest, will continue to improve with time 4. Cholecystitis with biliary obstruction, drain in place- for surgery on Friday 5. Dispo- doing very well, RTC prn  Vitaly Wanat, PA-C [facility] [phone]

## 2016-01-15 NOTE — Progress Notes (Signed)
PCP - Dr. [name] Cardiologist - Dr. Rockey Situ and Dr. Prescott Gum    EKG - 11/28/15 CXR - 12/11/15  Echo- 10/2015 Stress test - 10/2015 Cardiac Cath - 10/2015  Patient denies chest pain and shortness of breath at PAT appointment.    Patient states that he is no longer checking his blood sugars at home but he was checking them once a day (reports fasting glucose was 107-120).

## 2016-01-16 ENCOUNTER — Telehealth: Payer: Self-pay | Admitting: Cardiovascular Disease

## 2016-01-16 ENCOUNTER — Other Ambulatory Visit: Payer: Self-pay | Admitting: Cardiothoracic Surgery

## 2016-01-16 DIAGNOSIS — J441 Chronic obstructive pulmonary disease with (acute) exacerbation: Secondary | ICD-10-CM

## 2016-01-16 LAB — HEMOGLOBIN A1C
Hgb A1c MFr Bld: 5.5 % (ref 4.8–5.6)
MEAN PLASMA GLUCOSE: 111 mg/dL

## 2016-01-16 NOTE — Telephone Encounter (Signed)
Received records request Disability Determination Services, forwarded to [employer] for processing.  

## 2016-01-16 NOTE — Anesthesia Preprocedure Evaluation (Addendum)
Anesthesia Evaluation  Patient identified by MRN, date of birth, ID band Patient awake    Reviewed: Allergy & Precautions, NPO status , Patient's Chart, lab work & pertinent test results  Airway Mallampati: II  TM Distance: >3 FB Neck ROM: Full    Dental  (+) Dental Advisory Given   Pulmonary former smoker,    breath sounds clear to auscultation       Cardiovascular hypertension, Pt. on medications and Pt. on home beta blockers + CAD, + Past MI, + CABG and + Peripheral Vascular Disease   Rhythm:Regular Rate:Normal     Neuro/Psych negative neurological ROS     GI/Hepatic Neg liver ROS, GERD  ,  Endo/Other  diabetes, Type 2Hypothyroidism   Renal/GU CRFRenal disease     Musculoskeletal  (+) Arthritis ,   Abdominal   Peds  Hematology   Anesthesia Other Findings   Reproductive/Obstetrics                           Lab Results  Component Value Date   WBC 7.5 01/15/2016   HGB 13.1 01/15/2016   HCT 39.1 01/15/2016   MCV 92.4 01/15/2016   PLT 294 01/15/2016   Lab Results  Component Value Date   CREATININE 1.08 01/15/2016   BUN 8 01/15/2016   NA 137 01/15/2016   K 4.0 01/15/2016   CL 100 (L) 01/15/2016   CO2 27 01/15/2016    Anesthesia Physical Anesthesia Plan  ASA: III  Anesthesia Plan: General   Post-op Pain Management:    Induction: Intravenous  Airway Management Planned: Oral ETT  Additional Equipment:   Intra-op Plan:   Post-operative Plan: Extubation in OR  Informed Consent: I have reviewed the patients History and Physical, chart, labs and discussed the procedure including the risks, benefits and alternatives for the proposed anesthesia with the patient or authorized representative who has indicated his/her understanding and acceptance.   Dental advisory given  Plan Discussed with: CRNA  Anesthesia Plan Comments:        Anesthesia Quick Evaluation

## 2016-01-16 NOTE — Progress Notes (Signed)
Anesthesia Chart Review: Patient is a 65 year old male scheduled for laparoscopic cholecystectomy on 01/17/2016 by Dr. Dalbert Batman.  History includes He was admitted to [facility] 11/04/15 with sepsis due to acute cholecystitis, AKI, new onset afib, and NSTEMI. He had an abnormal stress test which lead to cardiac cath showing 4V CAD and carotid duplex showing severe RICA stenosis. He underwent CABG (LIMA-LAD, SVG-DAIG, SVG-RAMUS INT, SVG-PDA) and left CEA on 11/14/15. Cholecystostomy tube placed with plan for cholecystectomy after he recovered from CABG. Other history includes former smoker (quit 11/01/15), GERD, HTN, hypercholesterolemia, anxiety, depression, hyperthyroidism s/p radioactive iodine and subsequent hypothyroidism, DM2, umbilical hernia repair' 15, heavy ETOH reports only occasional beer since CABG).  - PCP is listed as Dr. [name]. - Cardiologist is Dr. Rockey Situ. He feels patient is acceptable risk for surgery and gave permission to hold Xarelto 2 days prior to surgery (although I don't see that patient was on post-operatively as PAF was in the setting of sepsis and converted with medication).  - CT surgeon is Dr. Prescott Gum. He has cleared patient for surgery from his standpoint. - Vascular surgeon is Dr. Donzetta Matters.  Meds include Lipitor, Lexapro, levothyroxine, metformin, Lopressor, Prilosec.  BP 135/73   Pulse 66   Temp 36.7 C (Oral)   Resp 20   Ht 5' 7" (1.702 m)   Wt 176 lb 2 oz (79.9 kg)   SpO2 99%   BMI 27.59 kg/m    11/28/15 EKG: NSR, low voltage QRS, non-specific T wave abnormality, prolonged QT.  11/04/15 Echo: Study Conclusions - Left ventricle: The cavity size was normal. Systolic function was   normal. The estimated ejection fraction was in the range of 50%   to 55%. Wall motion was normal; there were no regional wall   motion abnormalities. Features are consistent with a pseudonormal   left ventricular filling pattern, with concomitant abnormal   relaxation and increased  filling pressure (grade 2 diastolic   dysfunction). - Left atrium: The atrium was at the upper limits of normal in   size. - Right ventricle: Systolic function was normal. - Pulmonary arteries: Systolic pressure was within the normal   range. Impressions: - Rhythm is normal sinus.  11/06/15 PRE-CABG LHC: Final Conclusions:  Four-vessel disease Moderate to severe proximal LAD disease, Critical ostial left circumflex and ramus disease Critical mid to distal RCA disease Recommendations: Recommendation made for CABG (done 11/14/15).  12/16/15 Carotid U/S: Impression: Patent left carotid endarterectomy site with no left internal carotid artery stenosis. Doppler flow velocities suggest 40-59% right proximal ICA stenosis.  12/11/15 CXR: IMPRESSION: 1. Regressed but not completely resolved small pleural effusion(s). 2. No new cardiopulmonary abnormality.  11/11/15 PFTs: FVC 1.20 (28%), FEV1 0.72 (22%), DLCOunc 8.93 (31%). Severe COPD. Posttest comments: The results does not meet ATS standards for except ability and repeatability. Patient seems to be very confused at this time. Best attempts were taken.  Preoperative labs noted. A1c 5.5.  If no acute changes then I would anticipate that he could proceed as planned.  George Hugh [facility] Short Stay Center/Anesthesiology Phone [phone] 01/16/2016 10:11 AM

## 2016-01-17 ENCOUNTER — Ambulatory Visit (HOSPITAL_COMMUNITY): Payer: Managed Care, Other (non HMO) | Admitting: Anesthesiology

## 2016-01-17 ENCOUNTER — Ambulatory Visit (HOSPITAL_COMMUNITY): Payer: Managed Care, Other (non HMO) | Admitting: Vascular Surgery

## 2016-01-17 ENCOUNTER — Encounter (HOSPITAL_COMMUNITY)
Admission: RE | Disposition: A | Discharge status: Home / Self Care | Payer: Self-pay | Source: Ambulatory Visit | Attending: General Surgery

## 2016-01-17 ENCOUNTER — Ambulatory Visit (HOSPITAL_COMMUNITY): Payer: Managed Care, Other (non HMO)

## 2016-01-17 ENCOUNTER — Ambulatory Visit (HOSPITAL_COMMUNITY)
Admission: RE | Admit: 2016-01-17 | Discharge: 2016-01-17 | Disposition: A | Discharge status: Home / Self Care | Payer: Managed Care, Other (non HMO) | Source: Ambulatory Visit | Attending: General Surgery | Admitting: General Surgery

## 2016-01-17 DIAGNOSIS — K8012 Calculus of gallbladder with acute and chronic cholecystitis without obstruction: Secondary | ICD-10-CM | POA: Diagnosis not present

## 2016-01-17 DIAGNOSIS — Z951 Presence of aortocoronary bypass graft: Secondary | ICD-10-CM

## 2016-01-17 DIAGNOSIS — I25119 Atherosclerotic heart disease of native coronary artery with unspecified angina pectoris: Secondary | ICD-10-CM | POA: Diagnosis present

## 2016-01-17 DIAGNOSIS — E43 Unspecified severe protein-calorie malnutrition: Secondary | ICD-10-CM | POA: Diagnosis present

## 2016-01-17 DIAGNOSIS — K810 Acute cholecystitis: Secondary | ICD-10-CM | POA: Diagnosis present

## 2016-01-17 DIAGNOSIS — N182 Chronic kidney disease, stage 2 (mild): Secondary | ICD-10-CM | POA: Diagnosis present

## 2016-01-17 DIAGNOSIS — E1165 Type 2 diabetes mellitus with hyperglycemia: Secondary | ICD-10-CM | POA: Diagnosis present

## 2016-01-17 DIAGNOSIS — IMO0002 Reserved for concepts with insufficient information to code with codable children: Secondary | ICD-10-CM | POA: Diagnosis present

## 2016-01-17 HISTORY — PX: CHOLECYSTECTOMY: SHX55

## 2016-01-17 LAB — GLUCOSE, CAPILLARY
GLUCOSE-CAPILLARY: 145 mg/dL — AB (ref 65–99)
GLUCOSE-CAPILLARY: 150 mg/dL — AB (ref 65–99)

## 2016-01-17 SURGERY — LAPAROSCOPIC CHOLECYSTECTOMY WITH INTRAOPERATIVE CHOLANGIOGRAM
Anesthesia: General | Site: Abdomen

## 2016-01-17 MED ORDER — HYDROMORPHONE HCL 1 MG/ML IJ SOLN: 0.2500 mg | INTRAMUSCULAR | Status: DC | PRN

## 2016-01-17 MED ORDER — SUGAMMADEX SODIUM 200 MG/2ML IV SOLN
INTRAVENOUS | Status: DC | PRN
  Administered 2016-01-17: 200 mg via INTRAVENOUS

## 2016-01-17 MED ORDER — EVICEL 5 ML EX KIT
PACK | CUTANEOUS | Status: AC
  Filled 2016-01-17: qty 1

## 2016-01-17 MED ORDER — IOPAMIDOL (ISOVUE-300) INJECTION 61%
INTRAVENOUS | Status: AC
  Filled 2016-01-17: qty 50

## 2016-01-17 MED ORDER — SODIUM CHLORIDE 0.9 % IV SOLN
INTRAVENOUS | Status: DC | PRN
  Administered 2016-01-17: 14 mL

## 2016-01-17 MED ORDER — ONDANSETRON HCL 4 MG/2ML IJ SOLN
INTRAMUSCULAR | Status: DC | PRN
  Administered 2016-01-17: 4 mg via INTRAVENOUS

## 2016-01-17 MED ORDER — LIDOCAINE HCL (CARDIAC) 20 MG/ML IV SOLN
INTRAVENOUS | Status: DC | PRN
  Administered 2016-01-17: 25 mg via INTRATRACHEAL
  Administered 2016-01-17: 75 mg via INTRAVENOUS

## 2016-01-17 MED ORDER — FENTANYL CITRATE (PF) 100 MCG/2ML IJ SOLN
INTRAMUSCULAR | Status: AC
  Filled 2016-01-17: qty 2

## 2016-01-17 MED ORDER — BUPIVACAINE HCL (PF) 0.5 % IJ SOLN
INTRAMUSCULAR | Status: AC
  Filled 2016-01-17: qty 30

## 2016-01-17 MED ORDER — BUPIVACAINE-EPINEPHRINE 0.5% -1:200000 IJ SOLN
INTRAMUSCULAR | Status: DC | PRN
  Administered 2016-01-17: 15 mL

## 2016-01-17 MED ORDER — MIDAZOLAM HCL 5 MG/5ML IJ SOLN
INTRAMUSCULAR | Status: DC | PRN
Start: 2016-01-17 — End: 2016-01-17
  Administered 2016-01-17: 2 mg via INTRAVENOUS

## 2016-01-17 MED ORDER — PROMETHAZINE HCL 25 MG/ML IJ SOLN: 6.2500 mg | INTRAMUSCULAR | Status: DC | PRN

## 2016-01-17 MED ORDER — PROPOFOL 10 MG/ML IV BOLUS
INTRAVENOUS | Status: AC
  Filled 2016-01-17: qty 40

## 2016-01-17 MED ORDER — SODIUM CHLORIDE 0.9 % IR SOLN
Status: DC | PRN
  Administered 2016-01-17: 1000 mL

## 2016-01-17 MED ORDER — BUPIVACAINE-EPINEPHRINE (PF) 0.5% -1:200000 IJ SOLN
INTRAMUSCULAR | Status: AC
  Filled 2016-01-17: qty 30

## 2016-01-17 MED ORDER — MIDAZOLAM HCL 2 MG/2ML IJ SOLN
INTRAMUSCULAR | Status: AC
  Filled 2016-01-17: qty 2

## 2016-01-17 MED ORDER — 0.9 % SODIUM CHLORIDE (POUR BTL) OPTIME
TOPICAL | Status: DC | PRN
  Administered 2016-01-17: 1000 mL

## 2016-01-17 MED ORDER — FENTANYL CITRATE (PF) 100 MCG/2ML IJ SOLN
INTRAMUSCULAR | Status: DC | PRN
  Administered 2016-01-17 (×2): 100 ug via INTRAVENOUS

## 2016-01-17 MED ORDER — CHLORHEXIDINE GLUCONATE CLOTH 2 % EX PADS: 6.0000 | MEDICATED_PAD | Freq: Once | CUTANEOUS | Status: DC

## 2016-01-17 MED ORDER — DEXAMETHASONE SODIUM PHOSPHATE 10 MG/ML IJ SOLN
INTRAMUSCULAR | Status: AC
  Filled 2016-01-17: qty 1

## 2016-01-17 MED ORDER — ROCURONIUM BROMIDE 100 MG/10ML IV SOLN
INTRAVENOUS | Status: DC | PRN
  Administered 2016-01-17: 40 mg via INTRAVENOUS
  Administered 2016-01-17: 10 mg via INTRAVENOUS

## 2016-01-17 MED ORDER — VANCOMYCIN HCL IN DEXTROSE 1-5 GM/200ML-% IV SOLN
1000.0000 mg | INTRAVENOUS | Status: AC
  Administered 2016-01-17: 1000 mg via INTRAVENOUS

## 2016-01-17 MED ORDER — PHENYLEPHRINE HCL 10 MG/ML IJ SOLN
INTRAMUSCULAR | Status: DC | PRN
  Administered 2016-01-17: 120 ug via INTRAVENOUS
  Administered 2016-01-17: 80 ug via INTRAVENOUS
  Administered 2016-01-17: 120 ug via INTRAVENOUS

## 2016-01-17 MED ORDER — ONDANSETRON HCL 4 MG/2ML IJ SOLN
INTRAMUSCULAR | Status: AC
  Filled 2016-01-17: qty 2

## 2016-01-17 MED ORDER — VANCOMYCIN HCL IN DEXTROSE 1-5 GM/200ML-% IV SOLN
INTRAVENOUS | Status: AC
  Filled 2016-01-17: qty 200

## 2016-01-17 MED ORDER — LACTATED RINGERS IV SOLN
INTRAVENOUS | Status: DC | PRN
  Administered 2016-01-17 (×2): via INTRAVENOUS

## 2016-01-17 MED ORDER — HYDROCODONE-ACETAMINOPHEN 5-325 MG PO TABS: 1.0000 | ORAL_TABLET | ORAL | 0 refills | Status: DC | PRN

## 2016-01-17 SURGICAL SUPPLY — 45 items
APPLIER CLIP ROT 10 11.4 M/L (STAPLE) ×3
BLADE SURG ROTATE 9660 (MISCELLANEOUS) ×3 IMPLANT
CANISTER SUCTION 2500CC (MISCELLANEOUS) ×3 IMPLANT
CHLORAPREP W/TINT 26ML (MISCELLANEOUS) ×3 IMPLANT
CLIP APPLIE ROT 10 11.4 M/L (STAPLE) ×1 IMPLANT
COVER MAYO STAND STRL (DRAPES) ×3 IMPLANT
COVER SURGICAL LIGHT HANDLE (MISCELLANEOUS) ×3 IMPLANT
DERMABOND ADVANCED (GAUZE/BANDAGES/DRESSINGS) ×2
DERMABOND ADVANCED .7 DNX12 (GAUZE/BANDAGES/DRESSINGS) ×1 IMPLANT
DRAIN CHANNEL 19F RND (DRAIN) ×3 IMPLANT
DRAPE C-ARM 42X72 X-RAY (DRAPES) ×3 IMPLANT
ELECT REM PT RETURN 9FT ADLT (ELECTROSURGICAL) ×3
ELECTRODE REM PT RTRN 9FT ADLT (ELECTROSURGICAL) ×1 IMPLANT
ENDOLOOP SUT PDS II  0 18 (SUTURE) ×2
ENDOLOOP SUT PDS II 0 18 (SUTURE) ×1 IMPLANT
EVACUATOR SILICONE 100CC (DRAIN) ×3 IMPLANT
GLOVE BIOGEL PI IND STRL 7.0 (GLOVE) ×3 IMPLANT
GLOVE BIOGEL PI INDICATOR 7.0 (GLOVE) ×6
GLOVE EUDERMIC 7 POWDERFREE (GLOVE) ×3 IMPLANT
GLOVE SURG SS PI 6.5 STRL IVOR (GLOVE) ×6 IMPLANT
GOWN STRL REUS W/ TWL LRG LVL3 (GOWN DISPOSABLE) ×3 IMPLANT
GOWN STRL REUS W/ TWL XL LVL3 (GOWN DISPOSABLE) ×1 IMPLANT
GOWN STRL REUS W/TWL LRG LVL3 (GOWN DISPOSABLE) ×6
GOWN STRL REUS W/TWL XL LVL3 (GOWN DISPOSABLE) ×2
KIT BASIN OR (CUSTOM PROCEDURE TRAY) ×3 IMPLANT
KIT ROOM TURNOVER OR (KITS) ×3 IMPLANT
NS IRRIG 1000ML POUR BTL (IV SOLUTION) ×3 IMPLANT
PAD ARMBOARD 7.5X6 YLW CONV (MISCELLANEOUS) ×3 IMPLANT
POUCH SPECIMEN RETRIEVAL 10MM (ENDOMECHANICALS) ×3 IMPLANT
SCISSORS LAP 5X35 DISP (ENDOMECHANICALS) ×3 IMPLANT
SET CHOLANGIOGRAPH 5 50 .035 (SET/KITS/TRAYS/PACK) ×3 IMPLANT
SET IRRIG TUBING LAPAROSCOPIC (IRRIGATION / IRRIGATOR) ×3 IMPLANT
SLEEVE ENDOPATH XCEL 5M (ENDOMECHANICALS) ×3 IMPLANT
SPECIMEN JAR SMALL (MISCELLANEOUS) ×3 IMPLANT
SUT ETHILON 2 0 FS 18 (SUTURE) ×3 IMPLANT
SUT ETHILON 3 0 FSL (SUTURE) ×3 IMPLANT
SUT MNCRL AB 4-0 PS2 18 (SUTURE) ×3 IMPLANT
TOWEL OR 17X24 6PK STRL BLUE (TOWEL DISPOSABLE) ×3 IMPLANT
TOWEL OR 17X26 10 PK STRL BLUE (TOWEL DISPOSABLE) ×3 IMPLANT
TRAY LAPAROSCOPIC MC (CUSTOM PROCEDURE TRAY) ×3 IMPLANT
TROCAR XCEL 12X100 BLDLESS (ENDOMECHANICALS) ×3 IMPLANT
TROCAR XCEL BLUNT TIP 100MML (ENDOMECHANICALS) ×3 IMPLANT
TROCAR XCEL NON-BLD 11X100MML (ENDOMECHANICALS) ×3 IMPLANT
TROCAR XCEL NON-BLD 5MMX100MML (ENDOMECHANICALS) ×3 IMPLANT
TUBING INSUFFLATION (TUBING) ×3 IMPLANT

## 2016-01-17 NOTE — Interval H&P Note (Signed)
History and Physical Interval Note:  01/17/2016 6:41 AM  Brian Casey  has presented today for surgery, with the diagnosis of acute cholecystitis  The various methods of treatment have been discussed with the patient and family. After consideration of risks, benefits and other options for treatment, the patient has consented to  Procedure(s): LAPAROSCOPIC CHOLECYSTECTOMY WITH INTRAOPERATIVE CHOLANGIOGRAM POSSIBLE OPEN (N/A) as a surgical intervention .  The patient's history has been reviewed, patient examined, no change in status, stable for surgery.  I have reviewed the patient's chart and labs.  Questions were answered to the patient's satisfaction.     [name]

## 2016-01-17 NOTE — Discharge Instructions (Signed)
[facility] ______[facility] °LAPAROSCOPIC SURGERY: POST OP INSTRUCTIONS °Always review your discharge instruction sheet given to you by the facility where your surgery was performed. °IF YOU HAVE DISABILITY OR FAMILY LEAVE FORMS, YOU MUST BRING THEM TO THE OFFICE FOR PROCESSING.   °DO NOT GIVE THEM TO YOUR DOCTOR. ° °1. A prescription for pain medication may be given to you upon discharge.  Take your pain medication as prescribed, if needed.  If narcotic pain medicine is not needed, then you may take acetaminophen (Tylenol) or ibuprofen (Advil) as needed. °2. Take your usually prescribed medications unless otherwise directed. °3. If you need a refill on your pain medication, please contact your pharmacy.  They will contact our office to request authorization. Prescriptions will not be filled after 5pm or on week-ends. °4. You should follow a light diet the first few days after arrival home, such as soup and crackers, etc.  Be sure to include lots of fluids daily. °5. Most patients will experience some swelling and bruising in the area of the incisions.  Ice packs will help.  Swelling and bruising can take several days to resolve.  °6. It is common to experience some constipation if taking pain medication after surgery.  Increasing fluid intake and taking a stool softener (such as Colace) will usually help or prevent this problem from occurring.  A mild laxative (Milk of Magnesia or Miralax) should be taken according to package instructions if there are no bowel movements after 48 hours. °7. Unless discharge instructions indicate otherwise, you may remove your bandages 24-48 hours after surgery, and you may shower at that time.  You may have steri-strips (small skin tapes) in place directly over the incision.  These strips should be left on the skin for 7-10 days.  If your surgeon used skin glue on the incision, you may shower in 24 hours.  The glue will flake off over the next 2-3 weeks.  Any sutures or  staples will be removed at the office during your follow-up visit. °8. ACTIVITIES:  You may resume regular (light) daily activities beginning the next day--such as daily self-care, walking, climbing stairs--gradually increasing activities as tolerated.  You may have sexual intercourse when it is comfortable.  Refrain from any heavy lifting or straining until approved by your doctor. °a. You may drive when you are no longer taking prescription pain medication, you can comfortably wear a seatbelt, and you can safely maneuver your car and apply brakes. °b. RETURN TO WORK:  __________________________________________________________ °9. You should see your doctor in the office for a follow-up appointment approximately 2-3 weeks after your surgery.  Make sure that you call for this appointment within a day or two after you arrive home to insure a convenient appointment time. °10. OTHER INSTRUCTIONS: __________________________________________________________________________________________________________________________ __________________________________________________________________________________________________________________________ °WHEN TO CALL YOUR DOCTOR: °1. Fever over 101.0 °2. Inability to urinate °3. Continued bleeding from incision. °4. Increased pain, redness, or drainage from the incision. °5. Increasing abdominal pain ° °The clinic staff is available to answer your questions during regular business hours.  Please don’t hesitate to call and ask to speak to one of the nurses for clinical concerns.  If you have a medical emergency, go to the nearest emergency room or call 911.  A surgeon from [facility] is always on call at the hospital. °[address], [address], [place], [place]  [address] ? [address] [address], [place], [place]   [address] °[phone] ? [phone] ? FAX [phone] °Web site:   [email or web address]

## 2016-01-17 NOTE — Addendum Note (Signed)
Addendum  created 01/17/16 1227 by Lissa Morales, CRNA   Anesthesia Intra Meds edited

## 2016-01-17 NOTE — Anesthesia Postprocedure Evaluation (Signed)
Anesthesia Post Note  Patient: Brian Casey  Procedure(s) Performed: Procedure(s) (LRB): LAPAROSCOPIC CHOLECYSTECTOMY WITH INTRAOPERATIVE CHOLANGIOGRAM (N/A)  Patient location during evaluation: PACU Anesthesia Type: General Level of consciousness: awake and alert Pain management: pain level controlled Vital Signs Assessment: post-procedure vital signs reviewed and stable Respiratory status: spontaneous breathing, nonlabored ventilation, respiratory function stable and patient connected to nasal cannula oxygen Cardiovascular status: blood pressure returned to baseline and stable Postop Assessment: no signs of nausea or vomiting Anesthetic complications: no    Last Vitals:  Vitals:   01/17/16 1125 01/17/16 1126  BP: 124/65   Pulse: 73   Resp: 20   Temp:  36.7 C    Last Pain:  Vitals:   01/17/16 1125  TempSrc:   PainSc: 0-No pain                 Tiajuana Amass

## 2016-01-17 NOTE — Transfer of Care (Signed)
Immediate Anesthesia Transfer of Care Note  Patient: Brian Casey  Procedure(s) Performed: Procedure(s): LAPAROSCOPIC CHOLECYSTECTOMY WITH INTRAOPERATIVE CHOLANGIOGRAM (N/A)  Patient Location: PACU  Anesthesia Type:General  Level of Consciousness: awake, alert , oriented and patient cooperative  Airway & Oxygen Therapy: Patient Spontanous Breathing and Patient connected to face mask oxygen  Post-op Assessment: Report given to RN, Post -op Vital signs reviewed and stable and Patient moving all extremities X 4  Post vital signs: stable  Last Vitals:  Vitals:   01/17/16 0721 01/17/16 0954  BP:  (!) 146/74  Pulse: 70 64  Resp: 20 16  Temp: 36.4 C 36.6 C    Last Pain:  Vitals:   01/17/16 0721  TempSrc: Oral         Complications: No apparent anesthesia complications

## 2016-01-17 NOTE — Anesthesia Procedure Notes (Signed)
Procedure Name: Intubation Date/Time: 01/17/2016 8:17 AM Performed by: Lissa Morales Pre-anesthesia Checklist: Patient identified, Emergency Drugs available, Suction available and Patient being monitored Patient Re-evaluated:Patient Re-evaluated prior to inductionOxygen Delivery Method: Circle system utilized Preoxygenation: Pre-oxygenation with 100% oxygen Intubation Type: IV induction Ventilation: Mask ventilation without difficulty Laryngoscope Size: Mac and 4 Grade View: Grade I Tube type: Oral Tube size: 7.5 mm Number of attempts: 1 Airway Equipment and Method: Stylet and Oral airway Placement Confirmation: ETT inserted through vocal cords under direct vision,  positive ETCO2 and breath sounds checked- equal and bilateral Secured at: 21 cm Tube secured with: Tape Dental Injury: Teeth and Oropharynx as per pre-operative assessment

## 2016-01-17 NOTE — Op Note (Signed)
Patient Name:           Brian Casey   Date of Surgery:        01/17/2016  Pre op Diagnosis:      Acute and chronic cholecystitis                                      Status post percutaneous cholecystostomy  Post op Diagnosis:    Same  Procedure:                 Laparoscopic cholecystectomy with cholangiogram  Surgeon:                     Edsel Petrin. Dalbert Batman, M.D., FACS  Assistant:                      Donnie Mesa, M.D., FACS   Indication for Assistant: Asst. indicated due to complex exposure, intense inflammation, complex anatomy, high risk procedure for complications.  Operative Indications:    This is a pleasant 65 year old Caucasian male who returns to see me to discuss elective cholecystectomy.     He was transferred from [facility] on November 07, 2015 because of coronary artery disease and acute cholecystitis. He has diabetes, hypertension, hyperlipidemia, chronic alcohol and tobacco abuse. He presented at [facility] with right upper quadrant abdominal pain and had acalculous cholecystitis on exam and on CT scan. He was also found to be in atrial fibrillation with RVR. The abdominal pain had been going on for 3 days.      Ultrasound showed some wall thickening and sludge but no stones. Initial leukocytosis was 24,000 but that came down. He was placed on Zosyn. Cardiac catheterization revealed severe three-vessel coronary artery disease. He was then transferred to [facility] and he did rule in for NSTEMI. He stabilized and cardiology and cardiac surgery felt that he would need coronary artery bypass grafting. Given his imaging findings and physical exam it was fairly obvious that he had acute cholecystitis. He is felt to be a poor candidate for general anesthesia and cholecystectomy due to his coronary artery disease. We canceled a hepatobiliary scan and interventional radiology drained his gallbladder. He stabilized.     He ultimately  underwent left carotid endarterectomy by Dr. Donzetta Matters and coronary artery bypass grafting 4 by Dr. Prescott Gum on August 24. He recovered. He was weaned off of insulin drip. He apparently did well from both vascular procedures. He was discharged home with a percutaneous cholecystostomy tube in place and follow-up with general surgery.      He has resumed normal diet, but has lost 30 pounds.  He feels much better and looks much better. He says his bowel movements are normal consistency and her on normal brown color. Marland Kitchen He is not on any anticoagulation other than daily aspirin.      Comorbidities include diabetes mellitus type 2, hyperlipidemia, hypertension, CKD II, alcohol and tobacco abuse, now resolved, paroxysmal atrial fibrillation, GERD, mild protein calorie malnutrition. Acute blood loss anemia. He has had an umbilical hernia repair with mesh by Dr. Georgette Dover 2-3 years ago.      He underwent injection study of his cholecystostomy tube on December 12, 2015 which showed cystic duct obstruction. The tube had to be changed on September 22 because it occluded. It is now draining properly.  Liver function test are normal.  The anemia is better.      We had a long talk about cholecystectomy.  We had a long talk about laparoscopic cholecystectomy with cholangiogram. I discussed the indications, details, techniques, and numerous risk with him. He is aware of the risk of bleeding, infection, increased risk of converting to open because of inflamed anatomy, bile leak, injury to adjacent organs with major reconstructive surgery, wound hernia, cardiac pulmonary and thromboembolic problems. He understands these issues well. All of his questions are answered. He agrees with this plan. I gave him a patient information booklet and reviewed this with him personally.   Operative Findings:       The gallbladder was thick-walled and chronically inflamed.  The cholecystostomy tube was transperitoneal and entered  the dome of the gallbladder.  There were extensive adhesions of the surrounding tissue to the gallbladder.  The colon and duodenum were tethered up.  The colon came down fairly easily.  The first portion of the duodenum was more densely adherent to the gallbladder and there was inflammatory pedicle from the first portion of the duodenum to the gallbladder which had to be sharply divided.  This did not appear to be a fistula.  It did not appear that we had injured the serosa but we chose to put a PDS Endoloop around the inflammatory pedicle.  This was examined several times and everything looked fine.  Following that we were able to gently dissect the duodenum well off of the neck of the gallbladder.  We had good visualization of the cystic artery and cystic duct and created a nice critical view behind these structures.  The cholangiogram was normal, showing normal intrahepatic and extra hepatic biliary anatomy, no filling defect, and no obstruction with good flow of contrast into the duodenum.  There were a lot of soft perihepatic adhesions that were taken down.  The umbilical hernia repair was intact.  There were minimal adhesions in the mid and lower abdomen.  The patient did extremely well from an anesthesia standpoint.  Procedure in Detail:          Following the induction of general endotracheal anesthesia the abdomen was prepped and draped in a sterile fashion, surgical timeout performed, intravenous antibiotics were given.  All trocar sites were anesthetized with 0.5% Marcaine with epinephrine.     A 5 mm optical trocar was placed in the left subcostal region.  Optical entry was uneventful.  Pneumoperitoneum created.  There is no bleeding or bowel injury.  10 mm trocar was placed in the midline above the umbilical hernia repair.  Small trochars were placed in the subxiphoid region and 2 in the right upper quadrant.  We could identify and elevate the fundus of the gallbladder.  I removed the percutaneous  drain.  I took down the perihepatic adhesions.  I then slowly took the omental adhesions down off of the body of the gallbladder.  I divided the inflammatory pedicle of the duodenum as described above and put an Endoloop around this.  I really don't think there was even any serosal injury.  We continued take the adhesions down.  Isolated the cystic duct and inserted the cholangiogram catheter and performed a cholangiogram using the C-arm.  The cholangiogram was normal as described above.  I removed the cholangiocatheter, secured the cystic duct with multiple medical clips and divided it.  Isolated the cystic artery, secured it with multiple medical clips and divided it.  I Dissected the gallbladder from its bed with electrocautery and blunt  dissection, placed it in a specimen bag and removed it.    I irrigated the upper abdomen,  subphrenic space and right paracolic gutter extensively.  Everything looked very clean.  There was no bleeding and no bile leak.  All the clips appeared to be secure.  The did not appear to be any indication for a drain.     The pneumoperitoneum was released and the trochars removed.  Skin incisions were closed with subcuticular 4-0 Monocryl and Dermabond.  The patient tolerated the procedure well was taken to PACU in stable condition.  EBL 30-40 mL.  Counts correct.  Complications none.     Edsel Petrin. Dalbert Batman, M.D., FACS General and Minimally Invasive Surgery Breast and Colorectal Surgery  01/17/2016 9:49 AM

## 2016-01-20 ENCOUNTER — Encounter (HOSPITAL_COMMUNITY): Payer: Self-pay | Admitting: General Surgery

## 2016-01-21 DIAGNOSIS — Z0279 Encounter for issue of other medical certificate: Secondary | ICD-10-CM | POA: Diagnosis not present

## 2016-01-24 ENCOUNTER — Telehealth: Payer: Self-pay | Admitting: Cardiovascular Disease

## 2016-01-24 NOTE — Telephone Encounter (Signed)
Received records request Disability Determination Services, forwarded to [facility] for processing.

## 2016-02-05 DIAGNOSIS — Z736 Limitation of activities due to disability: Secondary | ICD-10-CM | POA: Diagnosis not present

## 2016-02-10 ENCOUNTER — Encounter: Payer: Managed Care, Other (non HMO) | Attending: Cardiovascular Disease | Admitting: *Deleted

## 2016-02-10 VITALS — Ht 68.0 in | Wt 178.0 lb

## 2016-02-10 DIAGNOSIS — Z951 Presence of aortocoronary bypass graft: Secondary | ICD-10-CM | POA: Diagnosis present

## 2016-02-10 DIAGNOSIS — I214 Non-ST elevation (NSTEMI) myocardial infarction: Secondary | ICD-10-CM

## 2016-02-10 NOTE — Progress Notes (Signed)
Daily Session Note  Patient Details  Name: Brian Casey MRN: [id number] Date of Birth: 31-Oct-1950 Referring Provider:  Dr. Rockey Situ  Encounter Date: 02/10/2016  Check In:     Session Check In - 02/10/16 1230      Check-In   Location [facility]   Staff Present Heath Lark, RN, BSN, CCRP;Minah Axelrod, RN, Levie Heritage, MA, ACSM RCEP, Exercise Physiologist   Supervising physician immediately available to respond to emergencies See telemetry face sheet for immediately available ER MD   Medication changes reported     No   Fall or balance concerns reported    Yes   Warm-up and Cool-down Performed as group-led instruction   Resistance Training Performed Yes   VAD Patient? No     Pain Assessment   Currently in Pain? No/denies         Goals Met:  Personal goals reviewed No report of cardiac concerns or symptoms  Goals Unmet:  Not Applicable  Comments:     Dr. Emily Filbert is Medical Director for [facility].

## 2016-02-10 NOTE — Progress Notes (Signed)
Cardiac Individual Treatment Plan  Patient Details  Name: Brian Casey MRN: [id number] Date of Birth: November 08, 1950 Referring Provider:   Flowsheet Row Cardiac Rehab from 02/10/2016 in [facility] Cardiac and Pulmonary Rehab  Referring Provider  Ida Rogue MD      Initial Encounter Date:  Flowsheet Row Cardiac Rehab from 02/10/2016 in [facility] Cardiac and Pulmonary Rehab  Date  02/10/16  Referring Provider  Ida Rogue MD      Visit Diagnosis: NSTEMI (non-ST elevated myocardial infarction) ([place])  S/P CABG x 4  Patient's Home Medications on Admission:  Current Outpatient Prescriptions:  .  aspirin 325 MG tablet, Take 325 mg by mouth daily., Disp: , Rfl:  .  atorvastatin (LIPITOR) 40 MG tablet, Take 1 tablet (40 mg total) by mouth daily. (Patient taking differently: Take 40 mg by mouth every evening. ), Disp: 90 tablet, Rfl: 3 .  escitalopram (LEXAPRO) 10 MG tablet, Take 1 tablet (10 mg total) by mouth daily., Disp: 30 tablet, Rfl: 0 .  levothyroxine (SYNTHROID, LEVOTHROID) 100 MCG tablet, Take 1 tablet (100 mcg total) by mouth daily before breakfast. OFFICE VISIT WITH  LABS REQUIRED FOR ADDITIONAL REFILLS, Disp: 30 tablet, Rfl: 0 .  metFORMIN (GLUCOPHAGE) 500 MG tablet, Take 500 mg by mouth 2 (two) times daily with a meal., Disp: , Rfl:  .  metoprolol tartrate (LOPRESSOR) 25 MG tablet, Take 0.5 tablets (12.5 mg total) by mouth 2 (two) times daily., Disp: 60 tablet, Rfl: 1 .  omeprazole (PRILOSEC) 20 MG capsule, Take 1 capsule (20 mg total) by mouth daily. OFFICE VISIT REQUIRED FOR ADDITIONAL REFILLS, Disp: 30 capsule, Rfl: 0 .  DULERA 200-5 MCG/ACT AERO, INHALE 2 PUFFS INTO THE LUNGS 2 (TWO) TIMES DAILY. (Patient not taking: Reported on 02/10/2016), Disp: 13 Inhaler, Rfl: 1 .  HYDROcodone-acetaminophen (NORCO/VICODIN) 5-325 MG tablet, Take 1-2 tablets by mouth every 4 (four) hours as needed for moderate pain or severe pain. (Patient not taking: Reported on 02/10/2016), Disp: 30 tablet,  Rfl: 0  Past Medical History: Past Medical History:  Diagnosis Date  . Acute renal failure ([place]) 11/06/2015   [name] 11/06/2015  . Anxiety   . Arthritis    "lower spine; fingers" (11/07/2015)  . Depression   . GERD (gastroesophageal reflux disease)   . High cholesterol   . History of bronchitis   . Hypertension 10/17/2013  . Hyperthyroidism    "had it radiated in his '31s"  . Hypothyroidism   . New onset atrial fibrillation ([place]) 11/06/2015   [name] 11/07/2015  . NSTEMI (non-ST elevated myocardial infarction) ([place]) 11/07/2015  . Thyroid disease   . Type II diabetes mellitus ([place])    type II  . Wears glasses     Tobacco Use: History  Smoking Status  . Former Smoker  . Packs/day: 0.12  . Years: 48.00  . Types: Cigarettes  Smokeless Tobacco  . Never Used    Comment: quit smoking 11/01/15    Labs: Recent Review Flowsheet Data    Labs for ITP Cardiac and Pulmonary Rehab Latest Ref Rng & Units 11/15/2015 11/15/2015 11/15/2015 11/15/2015 01/15/2016   Cholestrol 0 - 200 mg/dL - - - - -   LDLCALC 0 - 99 mg/dL - - - - -   LDLDIRECT mg/dL - - - - -   HDL >39.00 mg/dL - - - - -   Trlycerides <150 mg/dL - - - - -   Hemoglobin A1c 4.8 - 5.6 % - - - - 5.5   PHART 7.350 -  7.450 7.352 - - - -   PCO2ART 35.0 - 45.0 mmHg 38.4 - - - -   HCO3 20.0 - 24.0 mEq/L 21.4 - - - -   TCO2 0 - 100 mmol/L 23 - 22 25 -   ACIDBASEDEF 0.0 - 2.0 mmol/L 4.0(H) - - - -   O2SAT % 95.0 61.7 - - -       Exercise Target Goals: Date: 02/10/16  Exercise Program Goal: Individual exercise prescription set with THRR, safety & activity barriers. Participant demonstrates ability to understand and report RPE using BORG scale, to self-measure pulse accurately, and to acknowledge the importance of the exercise prescription.  Exercise Prescription Goal: Starting with aerobic activity 30 plus minutes a day, 3 days per week for initial exercise prescription. Provide home exercise prescription and guidelines that  participant acknowledges understanding prior to discharge.  Activity Barriers & Risk Stratification:     Activity Barriers & Cardiac Risk Stratification - 02/10/16 1229      Activity Barriers & Cardiac Risk Stratification   Activity Barriers History of Falls;Balance Concerns;Back Problems;Arthritis   Cardiac Risk Stratification High      6 Minute Walk:     6 Minute Walk    Row Name 02/10/16 1516         6 Minute Walk   Phase Initial     Distance 1210 feet     Walk Time 6 minutes     # of Rest Breaks 0     MPH 2.29     METS 3.1     RPE 12     VO2 Peak 10.86     Symptoms No     Resting HR 60 bpm     Resting BP 142/70     Max Ex. HR 85 bpm     Max Ex. BP 144/70     2 Minute Post BP 126/70        Initial Exercise Prescription:     Initial Exercise Prescription - 02/10/16 1500      Date of Initial Exercise RX and Referring Provider   Date 02/10/16   Referring Provider Ida Rogue MD     Treadmill   MPH 2   Grade 0.5   Minutes 15   METs 2.67     REL-XR   Level 2   Watts --  speed 50 rpm   Minutes 15   METs 2     T5 Nustep   Level 2   Watts --  80-100 spm   Minutes 15   METs 2     Prescription Details   Frequency (times per week) 3   Duration Progress to 45 minutes of aerobic exercise without signs/symptoms of physical distress     Intensity   THRR 40-80% of Max Heartrate 99-138   Ratings of Perceived Exertion 11-15   Perceived Dyspnea 0-4     Progression   Progression Continue to progress workloads to maintain intensity without signs/symptoms of physical distress.     Resistance Training   Training Prescription Yes   Weight 3lbs   Reps 10-12      Perform Capillary Blood Glucose checks as needed.  Exercise Prescription Changes:      Exercise Prescription Changes    Row Name 02/10/16 1500             Exercise Review   Progression -  walk test results         Response to Exercise   Blood Pressure (  Admit) 142/70        Blood Pressure (Exercise) 144/70       Blood Pressure (Exit) 126/70       Heart Rate (Admit) 60 bpm       Heart Rate (Exercise) 85 bpm       Heart Rate (Exit) 66 bpm       Oxygen Saturation (Admit) 99 %       Oxygen Saturation (Exercise) 97 %       Rating of Perceived Exertion (Exercise) 12       Symptoms none          Exercise Comments:      Exercise Comments    Row Name 02/10/16 1616           Exercise Comments Jamail wants to be more functional.  He wants to be able to help his family more with working around the house, yard work, and self care.          Discharge Exercise Prescription (Final Exercise Prescription Changes):     Exercise Prescription Changes - 02/10/16 1500      Exercise Review   Progression --  walk test results     Response to Exercise   Blood Pressure (Admit) 142/70   Blood Pressure (Exercise) 144/70   Blood Pressure (Exit) 126/70   Heart Rate (Admit) 60 bpm   Heart Rate (Exercise) 85 bpm   Heart Rate (Exit) 66 bpm   Oxygen Saturation (Admit) 99 %   Oxygen Saturation (Exercise) 97 %   Rating of Perceived Exertion (Exercise) 12   Symptoms none      Nutrition:  Target Goals: Understanding of nutrition guidelines, daily intake of sodium <1545m, cholesterol <2072m calories 30% from fat and 7% or less from saturated fats, daily to have 5 or more servings of fruits and vegetables.  Biometrics:     Pre Biometrics - 02/10/16 1617      Pre Biometrics   Height _0  (1.727 m)   Weight 178 lb (80.7 kg)   Waist Circumference 37.5 inches   Hip Circumference 39 inches   Waist to Hip Ratio 0.96 %   BMI (Calculated) 27.1   Single Leg Stand 6.85 seconds       Nutrition Therapy Plan and Nutrition Goals:     Nutrition Therapy & Goals - 02/10/16 1233      Nutrition Therapy   Drug/Food Interactions Statins/Certain Fruits     Intervention Plan   Intervention Prescribe, educate and counsel regarding individualized specific dietary  modifications aiming towards targeted core components such as weight, hypertension, lipid management, diabetes, heart failure and other comorbidities.;Nutrition handout(s) given to patient.   Expected Outcomes Short Term Goal: Understand basic principles of dietary content, such as calories, fat, sodium, cholesterol and nutrients.;Long Term Goal: Adherence to prescribed nutrition plan.      Nutrition Discharge: Rate Your Plate Scores:     Nutrition Assessments - 02/10/16 1238      Rate Your Plate Scores   Pre Score 80   Pre Score % 92 %      Nutrition Goals Re-Evaluation:   Psychosocial: Target Goals: Acknowledge presence or absence of depression, maximize coping skills, provide positive support system. Participant is able to verbalize types and ability to use techniques and skills needed for reducing stress and depression.  Initial Review & Psychosocial Screening:     Initial Psych Review & Screening - 02/10/16 1235      Family Dynamics   Good Support  System? Yes     Barriers   Psychosocial barriers to participate in program The patient should benefit from training in stress management and relaxation.     Screening Interventions   Interventions Encouraged to exercise      Quality of Life Scores:     Quality of Life - 02/10/16 1236      Quality of Life Scores   Health/Function Pre 18.96 %   Socioeconomic Pre 26.28 %   Psych/Spiritual Pre 24 %   Family Pre 26.4 %   GLOBAL Pre 22.81 %      PHQ-9: Recent Review Flowsheet Data    Depression screen [facility] 2/9 02/10/2016   Decreased Interest 1   Down, Depressed, Hopeless 0   PHQ - 2 Score 1   Altered sleeping 0   Tired, decreased energy 0   Change in appetite 1   Feeling bad or failure about yourself  1   Trouble concentrating 0   Moving slowly or fidgety/restless 0   Suicidal thoughts 0   PHQ-9 Score 3   Difficult doing work/chores Not difficult at all      Psychosocial Evaluation and  Intervention:   Psychosocial Re-Evaluation:   Vocational Rehabilitation: Provide vocational rehab assistance to qualifying candidates.   Vocational Rehab Evaluation & Intervention:     Vocational Rehab - 02/10/16 1230      Initial Vocational Rehab Evaluation & Intervention   Assessment shows need for Vocational Rehabilitation No      Education: Education Goals: Education classes will be provided on a weekly basis, covering required topics. Participant will state understanding/return demonstration of topics presented.  Learning Barriers/Preferences:     Learning Barriers/Preferences - 02/10/16 1229      Learning Barriers/Preferences   Learning Barriers None   Learning Preferences None      Education Topics: General Nutrition Guidelines/Fats and Fiber: -Group instruction provided by verbal, written material, models and posters to present the general guidelines for heart healthy nutrition. Gives an explanation and review of dietary fats and fiber.   Controlling Sodium/Reading Food Labels: -Group verbal and written material supporting the discussion of sodium use in heart healthy nutrition. Review and explanation with models, verbal and written materials for utilization of the food label.   Exercise Physiology & Risk Factors: - Group verbal and written instruction with models to review the exercise physiology of the cardiovascular system and associated critical values. Details cardiovascular disease risk factors and the goals associated with each risk factor.   Aerobic Exercise & Resistance Training: - Gives group verbal and written discussion on the health impact of inactivity. On the components of aerobic and resistive training programs and the benefits of this training and how to safely progress through these programs.   Flexibility, Balance, General Exercise Guidelines: - Provides group verbal and written instruction on the benefits of flexibility and balance  training programs. Provides general exercise guidelines with specific guidelines to those with heart or lung disease. Demonstration and skill practice provided.   Stress Management: - Provides group verbal and written instruction about the health risks of elevated stress, cause of high stress, and healthy ways to reduce stress.   Depression: - Provides group verbal and written instruction on the correlation between heart/lung disease and depressed mood, treatment options, and the stigmas associated with seeking treatment.   Anatomy & Physiology of the Heart: - Group verbal and written instruction and models provide basic cardiac anatomy and physiology, with the coronary electrical and arterial systems. Review of: AMI,  Angina, Valve disease, Heart Failure, Cardiac Arrhythmia, Pacemakers, and the ICD.   Cardiac Procedures: - Group verbal and written instruction and models to describe the testing methods done to diagnose heart disease. Reviews the outcomes of the test results. Describes the treatment choices: Medical Management, Angioplasty, or Coronary Bypass Surgery.   Cardiac Medications: - Group verbal and written instruction to review commonly prescribed medications for heart disease. Reviews the medication, class of the drug, and side effects. Includes the steps to properly store meds and maintain the prescription regimen.   Go Sex-Intimacy & Heart Disease, Get SMART - Goal Setting: - Group verbal and written instruction through game format to discuss heart disease and the return to sexual intimacy. Provides group verbal and written material to discuss and apply goal setting through the application of the S.M.A.R.T. Method.   Other Matters of the Heart: - Provides group verbal, written materials and models to describe Heart Failure, Angina, Valve Disease, and Diabetes in the realm of heart disease. Includes description of the disease process and treatment options available to the  cardiac patient.   Exercise & Equipment Safety: - Individual verbal instruction and demonstration of equipment use and safety with use of the equipment. Flowsheet Row Cardiac Rehab from 02/10/2016 in [facility] Cardiac and Pulmonary Rehab  Date  02/10/16  Educator  C. EnterkinRN  Instruction Review Code  1- partially meets, needs review/practice      Infection Prevention: - Provides verbal and written material to individual with discussion of infection control including proper hand washing and proper equipment cleaning during exercise session. Flowsheet Row Cardiac Rehab from 02/10/2016 in [facility] Cardiac and Pulmonary Rehab  Date  02/10/16  Educator  C. Suarez  Instruction Review Code  1- partially meets, needs review/practice      Falls Prevention: - Provides verbal and written material to individual with discussion of falls prevention and safety. Flowsheet Row Cardiac Rehab from 02/10/2016 in [facility] Cardiac and Pulmonary Rehab  Date  02/10/16  Educator  C. Knute Mazzuca, RN  Instruction Review Code  1- partially meets, needs review/practice      Diabetes: - Individual verbal and written instruction to review signs/symptoms of diabetes, desired ranges of glucose level fasting, after meals and with exercise. Advice that pre and post exercise glucose checks will be done for 3 sessions at entry of program. [place] from 02/10/2016 in [facility] Cardiac and Pulmonary Rehab  Date  02/10/16  Educator  C. [place]  Instruction Review Code  1- partially meets, needs review/practice       Knowledge Questionnaire Score:     Knowledge Questionnaire Score - 02/10/16 1230      Knowledge Questionnaire Score   Pre Score 22      Core Components/Risk Factors/Patient Goals at Admission:     Personal Goals and Risk Factors at Admission - 02/10/16 1233      Core Components/Risk Factors/Patient Goals on Admission    Weight Management Yes;Weight Loss   Intervention Weight  Management: Develop a combined nutrition and exercise program designed to reach desired caloric intake, while maintaining appropriate intake of nutrient and fiber, sodium and fats, and appropriate energy expenditure required for the weight goal.;Weight Management: Provide education and appropriate resources to help participant work on and attain dietary goals.   Admit Weight 178 lb (80.7 kg)   Goal Weight: Short Term 175 lb (79.4 kg)   Goal Weight: Long Term 168 lb (76.2 kg)   Expected Outcomes Short Term: Continue to assess and modify  interventions until short term weight is achieved;Long Term: Adherence to nutrition and physical activity/exercise program aimed toward attainment of established weight goal;Weight Loss: Understanding of general recommendations for a balanced deficit meal plan, which promotes 1-2 lb weight loss per week and includes a negative energy balance of [phone] kcal/d;Understanding recommendations for meals to include 15-35% energy as protein, 25-35% energy from fat, 35-60% energy from carbohydrates, less than 268m of dietary cholesterol, 20-35 gm of total fiber daily;Understanding of distribution of calorie intake throughout the day with the consumption of 4-5 meals/snacks   Sedentary Yes   Intervention Provide advice, education, support and counseling about physical activity/exercise needs.;Develop an individualized exercise prescription for aerobic and resistive training based on initial evaluation findings, risk stratification, comorbidities and participant's personal goals.   Expected Outcomes Achievement of increased cardiorespiratory fitness and enhanced flexibility, muscular endurance and strength shown through measurements of functional capacity and personal statement of participant.   Increase Strength and Stamina Yes   Intervention Provide advice, education, support and counseling about physical activity/exercise needs.;Develop an individualized exercise prescription for  aerobic and resistive training based on initial evaluation findings, risk stratification, comorbidities and participant's personal goals.   Expected Outcomes Achievement of increased cardiorespiratory fitness and enhanced flexibility, muscular endurance and strength shown through measurements of functional capacity and personal statement of participant.   Diabetes Yes   Intervention Provide education about signs/symptoms and action to take for hypo/hyperglycemia.;Provide education about proper nutrition, including hydration, and aerobic/resistive exercise prescription along with prescribed medications to achieve blood glucose in normal ranges: Fasting glucose 65-99 mg/dL   Expected Outcomes Short Term: Participant verbalizes understanding of the signs/symptoms and immediate care of hyper/hypoglycemia, proper foot care and importance of medication, aerobic/resistive exercise and nutrition plan for blood glucose control.;Long Term: Attainment of HbA1C < 7%.   Hypertension Yes   Intervention Monitor prescription use compliance.;Provide education on lifestyle modifcations including regular physical activity/exercise, weight management, moderate sodium restriction and increased consumption of fresh fruit, vegetables, and low fat dairy, alcohol moderation, and smoking cessation.   Expected Outcomes Short Term: Continued assessment and intervention until BP is < 140/949mHG in hypertensive participants. < 130/8058mG in hypertensive participants with diabetes, heart failure or chronic kidney disease.;Long Term: Maintenance of blood pressure at goal levels.   Stress Yes   Intervention Offer individual and/or small group education and counseling on adjustment to heart disease, stress management and health-related lifestyle change. Teach and support self-help strategies.;Refer participants experiencing significant psychosocial distress to appropriate mental health specialists for further evaluation and treatment.  When possible, include family members and significant others in education/counseling sessions.   Expected Outcomes Short Term: Participant demonstrates changes in health-related behavior, relaxation and other stress management skills, ability to obtain effective social support, and compliance with psychotropic medications if prescribed.;Long Term: Emotional wellbeing is indicated by absence of clinically significant psychosocial distress or social isolation.      Core Components/Risk Factors/Patient Goals Review:    Core Components/Risk Factors/Patient Goals at Discharge (Final Review):    ITP Comments:     ITP Comments    Row Name 02/10/16 1231 02/10/16 1235         ITP Comments LarNorvellid he does not want to go back to his current job since it is very stressful. LarOsmelates that he has applied for disability and is already on long term disability.  ITP created during Medical Review. Documentation of diagnosis of CABG in discharge note of 11/20/2015.  LarEliabid he quit smoking November 03, 2015 and has had no problems with that.          Comments: Donyae is ready to start Cardiac Rehab.  Initial ITP

## 2016-02-10 NOTE — Patient Instructions (Addendum)
Patient Instructions  Patient Details  Name: Brian Casey MRN: [id number] Date of Birth: 1951/02/27 Referring Provider:  Minna Merritts, MD  Below are the personal goals you chose as well as exercise and nutrition goals. Our goal is to help you keep on track towards obtaining and maintaining your goals. We will be discussing your progress on these goals with you throughout the program.  Initial Exercise Prescription:     Initial Exercise Prescription - 02/10/16 1500      Date of Initial Exercise RX and Referring Provider   Date 02/10/16   Referring Provider Ida Rogue MD     Treadmill   MPH 2   Grade 0.5   Minutes 15   METs 2.67     REL-XR   Level 2   Watts --  speed 50 rpm   Minutes 15   METs 2     T5 Nustep   Level 2   Watts --  80-100 spm   Minutes 15   METs 2     Prescription Details   Frequency (times per week) 3   Duration Progress to 45 minutes of aerobic exercise without signs/symptoms of physical distress     Intensity   THRR 40-80% of Max Heartrate 99-138   Ratings of Perceived Exertion 11-15   Perceived Dyspnea 0-4     Progression   Progression Continue to progress workloads to maintain intensity without signs/symptoms of physical distress.     Resistance Training   Training Prescription Yes   Weight 3lbs   Reps 10-12      Exercise Goals: Frequency: Be able to perform aerobic exercise three times per week working toward 3-5 days per week.  Intensity: Work with a perceived exertion of 11 (fairly light) - 15 (hard) as tolerated. Follow your new exercise prescription and watch for changes in prescription as you progress with the program. Changes will be reviewed with you when they are made.  Duration: You should be able to do 30 minutes of continuous aerobic exercise in addition to a 5 minute warm-up and a 5 minute cool-down routine.  Nutrition Goals: Your personal nutrition goals will be established when you do your nutrition  analysis with the dietician.  The following are nutrition guidelines to follow: Cholesterol < 251m/day Sodium < 15092mday Fiber: Men over 50 yrs - 30 grams per day  Personal Goals:     Personal Goals and Risk Factors at Admission - 02/10/16 1233      Core Components/Risk Factors/Patient Goals on Admission    Weight Management Yes;Weight Loss   Intervention Weight Management: Develop a combined nutrition and exercise program designed to reach desired caloric intake, while maintaining appropriate intake of nutrient and fiber, sodium and fats, and appropriate energy expenditure required for the weight goal.;Weight Management: Provide education and appropriate resources to help participant work on and attain dietary goals.   Admit Weight 178 lb (80.7 kg)   Goal Weight: Short Term 175 lb (79.4 kg)   Goal Weight: Long Term 168 lb (76.2 kg)   Expected Outcomes Short Term: Continue to assess and modify interventions until short term weight is achieved;Long Term: Adherence to nutrition and physical activity/exercise program aimed toward attainment of established weight goal;Weight Loss: Understanding of general recommendations for a balanced deficit meal plan, which promotes 1-2 lb weight loss per week and includes a negative energy balance of [id number] kcal/d;Understanding recommendations for meals to include 15-35% energy as protein, 25-35% energy from fat, 35-60% energy from carbohydrates,  less than 281m of dietary cholesterol, 20-35 gm of total fiber daily;Understanding of distribution of calorie intake throughout the day with the consumption of 4-5 meals/snacks   Sedentary Yes   Intervention Provide advice, education, support and counseling about physical activity/exercise needs.;Develop an individualized exercise prescription for aerobic and resistive training based on initial evaluation findings, risk stratification, comorbidities and participant's personal goals.   Expected Outcomes Achievement  of increased cardiorespiratory fitness and enhanced flexibility, muscular endurance and strength shown through measurements of functional capacity and personal statement of participant.   Increase Strength and Stamina Yes   Intervention Provide advice, education, support and counseling about physical activity/exercise needs.;Develop an individualized exercise prescription for aerobic and resistive training based on initial evaluation findings, risk stratification, comorbidities and participant's personal goals.   Expected Outcomes Achievement of increased cardiorespiratory fitness and enhanced flexibility, muscular endurance and strength shown through measurements of functional capacity and personal statement of participant.   Diabetes Yes   Intervention Provide education about signs/symptoms and action to take for hypo/hyperglycemia.;Provide education about proper nutrition, including hydration, and aerobic/resistive exercise prescription along with prescribed medications to achieve blood glucose in normal ranges: Fasting glucose 65-99 mg/dL   Expected Outcomes Short Term: Participant verbalizes understanding of the signs/symptoms and immediate care of hyper/hypoglycemia, proper foot care and importance of medication, aerobic/resistive exercise and nutrition plan for blood glucose control.;Long Term: Attainment of HbA1C < 7%.   Hypertension Yes   Intervention Monitor prescription use compliance.;Provide education on lifestyle modifcations including regular physical activity/exercise, weight management, moderate sodium restriction and increased consumption of fresh fruit, vegetables, and low fat dairy, alcohol moderation, and smoking cessation.   Expected Outcomes Short Term: Continued assessment and intervention until BP is < 140/958mHG in hypertensive participants. < 130/8064mG in hypertensive participants with diabetes, heart failure or chronic kidney disease.;Long Term: Maintenance of blood pressure  at goal levels.   Stress Yes   Intervention Offer individual and/or small group education and counseling on adjustment to heart disease, stress management and health-related lifestyle change. Teach and support self-help strategies.;Refer participants experiencing significant psychosocial distress to appropriate mental health specialists for further evaluation and treatment. When possible, include family members and significant others in education/counseling sessions.   Expected Outcomes Short Term: Participant demonstrates changes in health-related behavior, relaxation and other stress management skills, ability to obtain effective social support, and compliance with psychotropic medications if prescribed.;Long Term: Emotional wellbeing is indicated by absence of clinically significant psychosocial distress or social isolation.      Tobacco Use Initial Evaluation: History  Smoking Status  . Former Smoker  . Packs/day: 0.12  . Years: 48.00  . Types: Cigarettes  Smokeless Tobacco  . Never Used    Comment: quit smoking 11/01/15    Copy of goals given to participant.

## 2016-02-12 ENCOUNTER — Encounter: Payer: Managed Care, Other (non HMO) | Admitting: *Deleted

## 2016-02-12 DIAGNOSIS — Z951 Presence of aortocoronary bypass graft: Secondary | ICD-10-CM

## 2016-02-12 DIAGNOSIS — I214 Non-ST elevation (NSTEMI) myocardial infarction: Secondary | ICD-10-CM

## 2016-02-12 LAB — GLUCOSE, CAPILLARY
GLUCOSE-CAPILLARY: 135 mg/dL — AB (ref 65–99)
GLUCOSE-CAPILLARY: 126 mg/dL — AB (ref 65–99)

## 2016-02-12 NOTE — Progress Notes (Signed)
Daily Session Note  Patient Details  Name: Brian Casey MRN: [id number] Date of Birth: 1951-02-05 Referring Provider:   Flowsheet Row Cardiac Rehab from 02/10/2016 in [facility] Cardiac and Pulmonary Rehab  Referring Provider  Ida Rogue MD      Encounter Date: 02/12/2016  Check In:     Session Check In - 02/12/16 1048      Check-In   Location [facility]-Cardiac & Pulmonary Rehab   Staff Present [name], MA, ACSM RCEP, Exercise Physiologist;Susanne Bice, RN, BSN, Lance Sell, BA, ACSM CEP, Exercise Physiologist   Supervising physician immediately available to respond to emergencies See telemetry face sheet for immediately available ER MD   Medication changes reported     No   Fall or balance concerns reported    No   Warm-up and Cool-down Performed as group-led instruction   Resistance Training Performed Yes   VAD Patient? No     Pain Assessment   Currently in Pain? No/denies   Multiple Pain Sites No         Goals Met:  Independence with exercise equipment Exercise tolerated well Personal goals reviewed No report of cardiac concerns or symptoms Strength training completed today  Goals Unmet:  Not Applicable  Comments: First full day of exercise!  Patient was oriented to gym and equipment including functions, settings, policies, and procedures.  Patient's individual exercise prescription and treatment plan were reviewed.  All starting workloads were established based on the results of the 6 minute walk test done at initial orientation visit.  The plan for exercise progression was also introduced and progression will be customized based on patient's performance and goals.    Dr. Emily Filbert is Medical Director for [place] and [facility].

## 2016-02-17 ENCOUNTER — Encounter: Payer: Managed Care, Other (non HMO) | Admitting: *Deleted

## 2016-02-17 DIAGNOSIS — Z951 Presence of aortocoronary bypass graft: Secondary | ICD-10-CM | POA: Diagnosis not present

## 2016-02-17 DIAGNOSIS — I214 Non-ST elevation (NSTEMI) myocardial infarction: Secondary | ICD-10-CM

## 2016-02-17 LAB — GLUCOSE, CAPILLARY
GLUCOSE-CAPILLARY: 156 mg/dL — AB (ref 65–99)
Glucose-Capillary: 145 mg/dL — ABNORMAL HIGH (ref 65–99)

## 2016-02-17 NOTE — Progress Notes (Signed)
Daily Session Note  Patient Details  Name: RONI SCOW MRN: [id number] Date of Birth: 08/05/1950 Referring Provider:   Flowsheet Row Cardiac Rehab from 02/10/2016 in [facility] Cardiac and Pulmonary Rehab  Referring Provider  Ida Rogue MD      Encounter Date: 02/17/2016  Check In:     Session Check In - 02/17/16 0812      Check-In   Location [facility]-Cardiac & Pulmonary Rehab   Staff Present Gerlene Burdock, RN, Moises Blood, BS, ACSM CEP, Exercise Physiologist;Jessica Luan Pulling, [place], ACSM RCEP, Exercise Physiologist   Supervising physician immediately available to respond to emergencies See telemetry face sheet for immediately available ER MD   Medication changes reported     No   Fall or balance concerns reported    No   Warm-up and Cool-down Performed on first and last piece of equipment   Resistance Training Performed Yes   VAD Patient? No     Pain Assessment   Currently in Pain? No/denies   Multiple Pain Sites No         Goals Met:  Independence with exercise equipment Exercise tolerated well No report of cardiac concerns or symptoms Strength training completed today  Goals Unmet:  Not Applicable  Comments: Pt able to follow exercise prescription today without complaint.  Will continue to monitor for progression.    Dr. Emily Filbert is Medical Director for [place] and [facility].

## 2016-02-19 ENCOUNTER — Encounter: Payer: Managed Care, Other (non HMO) | Admitting: *Deleted

## 2016-02-19 DIAGNOSIS — Z951 Presence of aortocoronary bypass graft: Secondary | ICD-10-CM | POA: Diagnosis not present

## 2016-02-19 DIAGNOSIS — I214 Non-ST elevation (NSTEMI) myocardial infarction: Secondary | ICD-10-CM

## 2016-02-19 LAB — GLUCOSE, CAPILLARY
GLUCOSE-CAPILLARY: 156 mg/dL — AB (ref 65–99)
GLUCOSE-CAPILLARY: 128 mg/dL — AB (ref 65–99)

## 2016-02-19 NOTE — Progress Notes (Signed)
Daily Session Note  Patient Details  Name: Brian Casey MRN: [id number] Date of Birth: 1950/07/17 Referring Provider:   Flowsheet Row Cardiac Rehab from 02/10/2016 in [facility] Cardiac and Pulmonary Rehab  Referring Provider  Ida Rogue MD      Encounter Date: 02/19/2016  Check In:     Session Check In - 02/19/16 0753      Check-In   Location [facility]-Cardiac & Pulmonary Rehab   Staff Present [name], MA, ACSM RCEP, Exercise Physiologist;Amanda Oletta Darter, BA, ACSM CEP, Exercise Physiologist;Carroll Enterkin, RN, BSN   Supervising physician immediately available to respond to emergencies See telemetry face sheet for immediately available ER MD   Medication changes reported     No   Fall or balance concerns reported    No   Warm-up and Cool-down Performed on first and last piece of equipment   Resistance Training Performed Yes   VAD Patient? No     Pain Assessment   Currently in Pain? No/denies   Multiple Pain Sites No         Goals Met:  Independence with exercise equipment Exercise tolerated well No report of cardiac concerns or symptoms Strength training completed today  Goals Unmet:  Not Applicable  Comments: Pt able to follow exercise prescription today without complaint.  Will continue to monitor for progression.    Dr. Emily Filbert is Medical Director for [place] and [facility].

## 2016-02-20 ENCOUNTER — Other Ambulatory Visit: Payer: Self-pay | Admitting: *Deleted

## 2016-02-20 ENCOUNTER — Encounter: Payer: Self-pay | Admitting: Cardiovascular Disease

## 2016-02-20 MED ORDER — METOPROLOL TARTRATE 25 MG PO TABS: 12.5000 mg | ORAL_TABLET | Freq: Two times a day (BID) | ORAL | 0 refills | Status: DC

## 2016-02-21 ENCOUNTER — Encounter: Payer: Managed Care, Other (non HMO) | Attending: Cardiovascular Disease | Admitting: *Deleted

## 2016-02-21 DIAGNOSIS — I214 Non-ST elevation (NSTEMI) myocardial infarction: Secondary | ICD-10-CM

## 2016-02-21 DIAGNOSIS — Z951 Presence of aortocoronary bypass graft: Secondary | ICD-10-CM | POA: Diagnosis present

## 2016-02-21 NOTE — Progress Notes (Signed)
Daily Session Note  Patient Details  Name: Brian Casey MRN: [id number] Date of Birth: August 25, 1950 Referring Provider:   Flowsheet Row Cardiac Rehab from 02/10/2016 in [facility] Cardiac and Pulmonary Rehab  Referring Provider  Ida Rogue MD      Encounter Date: 02/21/2016  Check In:     Session Check In - 02/21/16 0854      Check-In   Staff Present Heath Lark, RN, BSN, CCRP;Carroll Enterkin, RN, Levie Heritage, MA, ACSM RCEP, Exercise Physiologist   Supervising physician immediately available to respond to emergencies See telemetry face sheet for immediately available ER MD   Medication changes reported     No   Fall or balance concerns reported    No   Warm-up and Cool-down Performed on first and last piece of equipment   Resistance Training Performed Yes   VAD Patient? No     Pain Assessment   Currently in Pain? No/denies         Goals Met:  Exercise tolerated well No report of cardiac concerns or symptoms Strength training completed today  Goals Unmet:  Not Applicable  Comments: Doing well with exercise prescription progression.    Dr. Emily Filbert is Medical Director for [place] and [facility].

## 2016-02-24 ENCOUNTER — Encounter: Payer: Managed Care, Other (non HMO) | Admitting: *Deleted

## 2016-02-24 DIAGNOSIS — Z951 Presence of aortocoronary bypass graft: Secondary | ICD-10-CM

## 2016-02-24 DIAGNOSIS — I214 Non-ST elevation (NSTEMI) myocardial infarction: Secondary | ICD-10-CM

## 2016-02-24 NOTE — Progress Notes (Signed)
Daily Session Note  Patient Details  Name: ALDEN BENSINGER MRN: [id number] Date of Birth: 10-28-1950 Referring Provider:   Flowsheet Row Cardiac Rehab from 02/10/2016 in [facility] Cardiac and Pulmonary Rehab  Referring Provider  Ida Rogue MD      Encounter Date: 02/24/2016  Check In:     Session Check In - 02/24/16 0756      Check-In   Location [facility]-Cardiac & Pulmonary Rehab   Staff Present Earlean Shawl, BS, ACSM CEP, Exercise Physiologist;Jessica Luan Pulling, MA, ACSM RCEP, Exercise Physiologist;Other   Supervising physician immediately available to respond to emergencies See telemetry face sheet for immediately available ER MD   Medication changes reported     No   Fall or balance concerns reported    No   Warm-up and Cool-down Performed on first and last piece of equipment   Resistance Training Performed Yes   VAD Patient? No     Pain Assessment   Currently in Pain? No/denies   Multiple Pain Sites No         Goals Met:  Independence with exercise equipment Exercise tolerated well No report of cardiac concerns or symptoms Strength training completed today  Goals Unmet:  Not Applicable  Comments: Pt able to follow exercise prescription today without complaint.  Will continue to monitor for progression.  Reviewed home exercise with pt today.  Pt plans to walk at home for exercise.  Reviewed THR, pulse, RPE, sign and symptoms, and when to call 911 or MD.  Also discussed weather considerations and indoor options.  Pt voiced understanding.   Dr. Emily Filbert is Medical Director for [place] and [facility].

## 2016-02-26 ENCOUNTER — Encounter: Payer: Managed Care, Other (non HMO) | Admitting: *Deleted

## 2016-02-26 ENCOUNTER — Encounter: Payer: Self-pay | Admitting: *Deleted

## 2016-02-26 DIAGNOSIS — Z951 Presence of aortocoronary bypass graft: Secondary | ICD-10-CM

## 2016-02-26 DIAGNOSIS — I214 Non-ST elevation (NSTEMI) myocardial infarction: Secondary | ICD-10-CM

## 2016-02-26 NOTE — Progress Notes (Signed)
Daily Session Note  Patient Details  Name: ODYSSEUS CADA MRN: [id number] Date of Birth: 1950-09-16 Referring Provider:   Flowsheet Row Cardiac Rehab from 02/10/2016 in [facility] Cardiac and Pulmonary Rehab  Referring Provider  Ida Rogue MD      Encounter Date: 02/26/2016  Check In:     Session Check In - 02/26/16 0857      Check-In   Location [facility]-Cardiac & Pulmonary Rehab   Staff Present [name], MA, ACSM RCEP, Exercise Physiologist;Susanne Bice, RN, BSN, Lance Sell, BA, ACSM CEP, Exercise Physiologist   Supervising physician immediately available to respond to emergencies See telemetry face sheet for immediately available ER MD   Medication changes reported     No   Fall or balance concerns reported    No   Warm-up and Cool-down Performed on first and last piece of equipment   Resistance Training Performed Yes   VAD Patient? No     Pain Assessment   Currently in Pain? No/denies   Multiple Pain Sites No         Goals Met:  Independence with exercise equipment Exercise tolerated well No report of cardiac concerns or symptoms Strength training completed today  Goals Unmet:  Not Applicable  Comments: Pt able to follow exercise prescription today without complaint.  Will continue to monitor for progression.    Dr. Emily Filbert is Medical Director for [place] and [facility].

## 2016-02-26 NOTE — Progress Notes (Signed)
Cardiac Individual Treatment Plan  Patient Details  Name: DETRICH RAKESTRAW MRN: [id number] Date of Birth: 07/18/1950 Referring Provider:   Flowsheet Row Cardiac Rehab from 02/10/2016 in [facility] Cardiac and Pulmonary Rehab  Referring Provider  Ida Rogue MD      Initial Encounter Date:  Flowsheet Row Cardiac Rehab from 02/10/2016 in [facility] Cardiac and Pulmonary Rehab  Date  02/10/16  Referring Provider  Ida Rogue MD      Visit Diagnosis: NSTEMI (non-ST elevated myocardial infarction) ([place])  S/P CABG x 4  Patient's Home Medications on Admission:  Current Outpatient Prescriptions:  .  aspirin 325 MG tablet, Take 325 mg by mouth daily., Disp: , Rfl:  .  atorvastatin (LIPITOR) 40 MG tablet, Take 1 tablet (40 mg total) by mouth daily. (Patient taking differently: Take 40 mg by mouth every evening. ), Disp: 90 tablet, Rfl: 3 .  DULERA 200-5 MCG/ACT AERO, INHALE 2 PUFFS INTO THE LUNGS 2 (TWO) TIMES DAILY. (Patient not taking: Reported on 02/10/2016), Disp: 13 Inhaler, Rfl: 1 .  escitalopram (LEXAPRO) 10 MG tablet, Take 1 tablet (10 mg total) by mouth daily., Disp: 30 tablet, Rfl: 0 .  HYDROcodone-acetaminophen (NORCO/VICODIN) 5-325 MG tablet, Take 1-2 tablets by mouth every 4 (four) hours as needed for moderate pain or severe pain. (Patient not taking: Reported on 02/10/2016), Disp: 30 tablet, Rfl: 0 .  levothyroxine (SYNTHROID, LEVOTHROID) 100 MCG tablet, Take 1 tablet (100 mcg total) by mouth daily before breakfast. OFFICE VISIT WITH  LABS REQUIRED FOR ADDITIONAL REFILLS, Disp: 30 tablet, Rfl: 0 .  metFORMIN (GLUCOPHAGE) 500 MG tablet, Take 500 mg by mouth 2 (two) times daily with a meal., Disp: , Rfl:  .  metoprolol tartrate (LOPRESSOR) 25 MG tablet, Take 0.5 tablets (12.5 mg total) by mouth 2 (two) times daily., Disp: 90 tablet, Rfl: 0 .  omeprazole (PRILOSEC) 20 MG capsule, Take 1 capsule (20 mg total) by mouth daily. OFFICE VISIT REQUIRED FOR ADDITIONAL REFILLS, Disp: 30 capsule,  Rfl: 0  Past Medical History: Past Medical History:  Diagnosis Date  . Acute renal failure ([place]) 11/06/2015   [name] 11/06/2015  . Anxiety   . Arthritis    "lower spine; fingers" (11/07/2015)  . Depression   . GERD (gastroesophageal reflux disease)   . High cholesterol   . History of bronchitis   . Hypertension 10/17/2013  . Hyperthyroidism    "had it radiated in his '16s"  . Hypothyroidism   . New onset atrial fibrillation ([place]) 11/06/2015   [name] 11/07/2015  . NSTEMI (non-ST elevated myocardial infarction) ([place]) 11/07/2015  . Thyroid disease   . Type II diabetes mellitus ([place])    type II  . Wears glasses     Tobacco Use: History  Smoking Status  . Former Smoker  . Packs/day: 0.12  . Years: 48.00  . Types: Cigarettes  Smokeless Tobacco  . Never Used    Comment: quit smoking 11/01/15    Labs: Recent Review Flowsheet Data    Labs for ITP Cardiac and Pulmonary Rehab Latest Ref Rng & Units 11/15/2015 11/15/2015 11/15/2015 11/15/2015 01/15/2016   Cholestrol 0 - 200 mg/dL - - - - -   LDLCALC 0 - 99 mg/dL - - - - -   LDLDIRECT mg/dL - - - - -   HDL >39.00 mg/dL - - - - -   Trlycerides <150 mg/dL - - - - -   Hemoglobin A1c 4.8 - 5.6 % - - - - 5.5   PHART 7.350 -  7.450 7.352 - - - -   PCO2ART 35.0 - 45.0 mmHg 38.4 - - - -   HCO3 20.0 - 24.0 mEq/L 21.4 - - - -   TCO2 0 - 100 mmol/L 23 - 22 25 -   ACIDBASEDEF 0.0 - 2.0 mmol/L 4.0(H) - - - -   O2SAT % 95.0 61.7 - - -       Exercise Target Goals:    Exercise Program Goal: Individual exercise prescription set with THRR, safety & activity barriers. Participant demonstrates ability to understand and report RPE using BORG scale, to self-measure pulse accurately, and to acknowledge the importance of the exercise prescription.  Exercise Prescription Goal: Starting with aerobic activity 30 plus minutes a day, 3 days per week for initial exercise prescription. Provide home exercise prescription and guidelines that participant  acknowledges understanding prior to discharge.  Activity Barriers & Risk Stratification:     Activity Barriers & Cardiac Risk Stratification - 02/10/16 1229      Activity Barriers & Cardiac Risk Stratification   Activity Barriers History of Falls;Balance Concerns;Back Problems;Arthritis   Cardiac Risk Stratification High      6 Minute Walk:     6 Minute Walk    Row Name 02/10/16 1516         6 Minute Walk   Phase Initial     Distance 1210 feet     Walk Time 6 minutes     # of Rest Breaks 0     MPH 2.29     METS 3.1     RPE 12     VO2 Peak 10.86     Symptoms No     Resting HR 60 bpm     Resting BP 142/70     Max Ex. HR 85 bpm     Max Ex. BP 144/70     2 Minute Post BP 126/70        Initial Exercise Prescription:     Initial Exercise Prescription - 02/10/16 1500      Date of Initial Exercise RX and Referring Provider   Date 02/10/16   Referring Provider Ida Rogue MD     Treadmill   MPH 2   Grade 0.5   Minutes 15   METs 2.67     REL-XR   Level 2   Watts --  speed 50 rpm   Minutes 15   METs 2     T5 Nustep   Level 2   Watts --  80-100 spm   Minutes 15   METs 2     Prescription Details   Frequency (times per week) 3   Duration Progress to 45 minutes of aerobic exercise without signs/symptoms of physical distress     Intensity   THRR 40-80% of Max Heartrate 99-138   Ratings of Perceived Exertion 11-15   Perceived Dyspnea 0-4     Progression   Progression Continue to progress workloads to maintain intensity without signs/symptoms of physical distress.     Resistance Training   Training Prescription Yes   Weight 3lbs   Reps 10-12      Perform Capillary Blood Glucose checks as needed.  Exercise Prescription Changes:     Exercise Prescription Changes    Row Name 02/10/16 1500 02/19/16 1000 02/24/16 0900         Exercise Review   Progression -  walk test results Yes Yes       Response to Exercise   Blood Pressure (Admit)  142/70 128/64  -     Blood Pressure (Exercise) 144/70 130/74  -     Blood Pressure (Exit) 126/70 116/52  -     Heart Rate (Admit) 60 bpm 75 bpm  -     Heart Rate (Exercise) 85 bpm 115 bpm  -     Heart Rate (Exit) 66 bpm 73 bpm  -     Oxygen Saturation (Admit) 99 %  -  -     Oxygen Saturation (Exercise) 97 %  -  -     Rating of Perceived Exertion (Exercise) 12 12  -     Symptoms none none none     Comments  -  - Home Exercise Guidelines given 02/24/16     Duration  - Progress to 45 minutes of aerobic exercise without signs/symptoms of physical distress Progress to 45 minutes of aerobic exercise without signs/symptoms of physical distress     Intensity  - THRR unchanged THRR unchanged       Progression   Progression  - Continue progressive overload as per policy without signs/symptoms or physical distress. Continue progressive overload as per policy without signs/symptoms or physical distress.     Average METs  - 2.85 2.85       Resistance Training   Training Prescription  - Yes Yes     Weight  - 3lbs 3lbs     Reps  - 10-12 10-12       Interval Training   Interval Training  - No No       Treadmill   MPH  - 2.5 2.5     Grade  - 1.5 1.5     Minutes  - 15 15     METs  - 3.26 3.26       REL-XR   Level  - 2 2     Minutes  - 15 15     METs  - 2.7 2.7       T5 Nustep   Level  - 2 2     Minutes  - 15 15     METs  - 2.6 2.6       Home Exercise Plan   Plans to continue exercise at  -  - Home  walking     Frequency  -  - Add 2 additional days to program exercise sessions.        Exercise Comments:     Exercise Comments    Row Name 02/10/16 1616 02/12/16 1049 02/19/16 1039 02/24/16 0921     Exercise Comments Kadeem wants to be more functional.  He wants to be able to help his family more with working around the house, yard work, and self care.  First full day of exercise!  Patient was oriented to gym and equipment including functions, settings, policies, and procedures.   Patient's individual exercise prescription and treatment plan were reviewed.  All starting workloads were established based on the results of the 6 minute walk test done at initial orientation visit.  The plan for exercise progression was also introduced and progression will be customized based on patient's performance and goals. Jakarius has had a good three full days of exercise.  He is already starting to make some progress. We will continue to track his progression. Reviewed home exercise with pt today.  Pt plans to walk at home for exercise.  Reviewed THR, pulse, RPE, sign and symptoms, and when to call 911 or MD.  Also discussed  weather considerations and indoor options.  Pt voiced understanding.       Discharge Exercise Prescription (Final Exercise Prescription Changes):     Exercise Prescription Changes - 02/24/16 0900      Exercise Review   Progression Yes     Response to Exercise   Symptoms none   Comments Home Exercise Guidelines given 02/24/16   Duration Progress to 45 minutes of aerobic exercise without signs/symptoms of physical distress   Intensity THRR unchanged     Progression   Progression Continue progressive overload as per policy without signs/symptoms or physical distress.   Average METs 2.85     Resistance Training   Training Prescription Yes   Weight 3lbs   Reps 10-12     Interval Training   Interval Training No     Treadmill   MPH 2.5   Grade 1.5   Minutes 15   METs 3.26     REL-XR   Level 2   Minutes 15   METs 2.7     T5 Nustep   Level 2   Minutes 15   METs 2.6     Home Exercise Plan   Plans to continue exercise at Home  walking   Frequency Add 2 additional days to program exercise sessions.      Nutrition:  Target Goals: Understanding of nutrition guidelines, daily intake of sodium <1549m, cholesterol <2040m calories 30% from fat and 7% or less from saturated fats, daily to have 5 or more servings of fruits and  vegetables.  Biometrics:     Pre Biometrics - 02/10/16 1617      Pre Biometrics   Height _0  (1.727 m)   Weight 178 lb (80.7 kg)   Waist Circumference 37.5 inches   Hip Circumference 39 inches   Waist to Hip Ratio 0.96 %   BMI (Calculated) 27.1   Single Leg Stand 6.85 seconds       Nutrition Therapy Plan and Nutrition Goals:     Nutrition Therapy & Goals - 02/10/16 1233      Nutrition Therapy   Drug/Food Interactions Statins/Certain Fruits     Intervention Plan   Intervention Prescribe, educate and counsel regarding individualized specific dietary modifications aiming towards targeted core components such as weight, hypertension, lipid management, diabetes, heart failure and other comorbidities.;Nutrition handout(s) given to patient.   Expected Outcomes Short Term Goal: Understand basic principles of dietary content, such as calories, fat, sodium, cholesterol and nutrients.;Long Term Goal: Adherence to prescribed nutrition plan.      Nutrition Discharge: Rate Your Plate Scores:     Nutrition Assessments - 02/10/16 1238      Rate Your Plate Scores   Pre Score 80   Pre Score % 92 %      Nutrition Goals Re-Evaluation:   Psychosocial: Target Goals: Acknowledge presence or absence of depression, maximize coping skills, provide positive support system. Participant is able to verbalize types and ability to use techniques and skills needed for reducing stress and depression.  Initial Review & Psychosocial Screening:     Initial Psych Review & Screening - 02/10/16 1235      Family Dynamics   Good Support System? Yes     Barriers   Psychosocial barriers to participate in program The patient should benefit from training in stress management and relaxation.     Screening Interventions   Interventions Encouraged to exercise      Quality of Life Scores:     Quality of Life -  02/10/16 1236      Quality of Life Scores   Health/Function Pre 18.96 %    Socioeconomic Pre 26.28 %   Psych/Spiritual Pre 24 %   Family Pre 26.4 %   GLOBAL Pre 22.81 %      PHQ-9: Recent Review Flowsheet Data    Depression screen [facility] 2/9 02/10/2016   Decreased Interest 1   Down, Depressed, Hopeless 0   PHQ - 2 Score 1   Altered sleeping 0   Tired, decreased energy 0   Change in appetite 1   Feeling bad or failure about yourself  1   Trouble concentrating 0   Moving slowly or fidgety/restless 0   Suicidal thoughts 0   PHQ-9 Score 3   Difficult doing work/chores Not difficult at all      Psychosocial Evaluation and Intervention:     Psychosocial Evaluation - 02/12/16 0949      Psychosocial Evaluation & Interventions   Interventions Encouraged to exercise with the program and follow exercise prescription;Stress management education;Relaxation education   Comments Counselor met with Mr. Dunagan (Mr. Chauncey Cruel) today for initial psychosocial evaluation.  He is a 65 year old who had a CABGx4 in August and recently had his gall bladder removed as well.  He is a diabetic and on medications for this.  Mr. Chauncey Cruel has a strong support system with a spouse of 38 years; a daughter and son who live close by and 4 grandchildren that are his pride and joy.  He reports his appetite is finally improving with the removal of his gall bladder - but he lost 40 pounds since since heart surgery in August as a result.  He is sleeping well and reports he is typically in a positive mood.  Mr. Chauncey Cruel does admit to a history of depression and anxiety with an overwhelmingly stressful job with unrealistic demands that he reports was "killing me."  Mr. Chauncey Cruel is on medications now for depression and anxiety and states it is maintaining his current symptoms.  He was a smoker and drinker to cope prior to his surgery; but he is learning new coping skills now with exercise and engaging in healthy hobbies.  He has goals to increase his stamina and strength while in this program.  Staff will continue to follow with  him.  He will benefit from the psychoeducational components of this program - particularly stress management.        Psychosocial Re-Evaluation:   Vocational Rehabilitation: Provide vocational rehab assistance to qualifying candidates.   Vocational Rehab Evaluation & Intervention:     Vocational Rehab - 02/10/16 1230      Initial Vocational Rehab Evaluation & Intervention   Assessment shows need for Vocational Rehabilitation No      Education: Education Goals: Education classes will be provided on a weekly basis, covering required topics. Participant will state understanding/return demonstration of topics presented.  Learning Barriers/Preferences:     Learning Barriers/Preferences - 02/10/16 1229      Learning Barriers/Preferences   Learning Barriers None   Learning Preferences None      Education Topics: General Nutrition Guidelines/Fats and Fiber: -Group instruction provided by verbal, written material, models and posters to present the general guidelines for heart healthy nutrition. Gives an explanation and review of dietary fats and fiber.   Controlling Sodium/Reading Food Labels: -Group verbal and written material supporting the discussion of sodium use in heart healthy nutrition. Review and explanation with models, verbal and written materials for utilization of the  food label.   Exercise Physiology & Risk Factors: - Group verbal and written instruction with models to review the exercise physiology of the cardiovascular system and associated critical values. Details cardiovascular disease risk factors and the goals associated with each risk factor.   Aerobic Exercise & Resistance Training: - Gives group verbal and written discussion on the health impact of inactivity. On the components of aerobic and resistive training programs and the benefits of this training and how to safely progress through these programs. Flowsheet Row Cardiac Rehab from 02/24/2016 in [facility]  Cardiac and Pulmonary Rehab  Date  02/17/16  Educator  [facility]  Instruction Review Code  2- meets goals/outcomes      Flexibility, Balance, General Exercise Guidelines: - Provides group verbal and written instruction on the benefits of flexibility and balance training programs. Provides general exercise guidelines with specific guidelines to those with heart or lung disease. Demonstration and skill practice provided. Flowsheet Row Cardiac Rehab from 02/24/2016 in [facility] Cardiac and Pulmonary Rehab  Date  02/19/16  Educator  AS  Instruction Review Code  2- meets goals/outcomes      Stress Management: - Provides group verbal and written instruction about the health risks of elevated stress, cause of high stress, and healthy ways to reduce stress.   Depression: - Provides group verbal and written instruction on the correlation between heart/lung disease and depressed mood, treatment options, and the stigmas associated with seeking treatment.   Anatomy & Physiology of the Heart: - Group verbal and written instruction and models provide basic cardiac anatomy and physiology, with the coronary electrical and arterial systems. Review of: AMI, Angina, Valve disease, Heart Failure, Cardiac Arrhythmia, Pacemakers, and the ICD. Flowsheet Row Cardiac Rehab from 02/24/2016 in [facility] Cardiac and Pulmonary Rehab  Date  02/24/16  Educator  TS  Instruction Review Code  2- meets goals/outcomes      Cardiac Procedures: - Group verbal and written instruction and models to describe the testing methods done to diagnose heart disease. Reviews the outcomes of the test results. Describes the treatment choices: Medical Management, Angioplasty, or Coronary Bypass Surgery.   Cardiac Medications: - Group verbal and written instruction to review commonly prescribed medications for heart disease. Reviews the medication, class of the drug, and side effects. Includes the steps to properly store meds and maintain the  prescription regimen.   Go Sex-Intimacy & Heart Disease, Get SMART - Goal Setting: - Group verbal and written instruction through game format to discuss heart disease and the return to sexual intimacy. Provides group verbal and written material to discuss and apply goal setting through the application of the S.M.A.R.T. Method.   Other Matters of the Heart: - Provides group verbal, written materials and models to describe Heart Failure, Angina, Valve Disease, and Diabetes in the realm of heart disease. Includes description of the disease process and treatment options available to the cardiac patient. Flowsheet Row Cardiac Rehab from 02/24/2016 in [facility] Cardiac and Pulmonary Rehab  Date  02/24/16  Educator  TS  Instruction Review Code  2- meets goals/outcomes      Exercise & Equipment Safety: - Individual verbal instruction and demonstration of equipment use and safety with use of the equipment. Flowsheet Row Cardiac Rehab from 02/24/2016 in [facility] Cardiac and Pulmonary Rehab  Date  02/10/16  Educator  C. [place]  Instruction Review Code  1- partially meets, needs review/practice      Infection Prevention: - Provides verbal and written material to individual with discussion of infection control  including proper hand washing and proper equipment cleaning during exercise session. Flowsheet Row Cardiac Rehab from 02/24/2016 in [facility] Cardiac and Pulmonary Rehab  Date  02/10/16  Educator  C. [place]  Instruction Review Code  1- partially meets, needs review/practice      Falls Prevention: - Provides verbal and written material to individual with discussion of falls prevention and safety. Flowsheet Row Cardiac Rehab from 02/24/2016 in [facility] Cardiac and Pulmonary Rehab  Date  02/10/16  Educator  C. [place], RN  Instruction Review Code  1- partially meets, needs review/practice      Diabetes: - Individual verbal and written instruction to review signs/symptoms of diabetes, desired  ranges of glucose level fasting, after meals and with exercise. Advice that pre and post exercise glucose checks will be done for 3 sessions at entry of program. Flowsheet Row Cardiac Rehab from 02/24/2016 in [facility] Cardiac and Pulmonary Rehab  Date  02/10/16  Educator  C. [place]  Instruction Review Code  1- partially meets, needs review/practice       Knowledge Questionnaire Score:     Knowledge Questionnaire Score - 02/10/16 1230      Knowledge Questionnaire Score   Pre Score 22      Core Components/Risk Factors/Patient Goals at Admission:     Personal Goals and Risk Factors at Admission - 02/10/16 1233      Core Components/Risk Factors/Patient Goals on Admission    Weight Management Yes;Weight Loss   Intervention Weight Management: Develop a combined nutrition and exercise program designed to reach desired caloric intake, while maintaining appropriate intake of nutrient and fiber, sodium and fats, and appropriate energy expenditure required for the weight goal.;Weight Management: Provide education and appropriate resources to help participant work on and attain dietary goals.   Admit Weight 178 lb (80.7 kg)   Goal Weight: Short Term 175 lb (79.4 kg)   Goal Weight: Long Term 168 lb (76.2 kg)   Expected Outcomes Short Term: Continue to assess and modify interventions until short term weight is achieved;Long Term: Adherence to nutrition and physical activity/exercise program aimed toward attainment of established weight goal;Weight Loss: Understanding of general recommendations for a balanced deficit meal plan, which promotes 1-2 lb weight loss per week and includes a negative energy balance of [phone] kcal/d;Understanding recommendations for meals to include 15-35% energy as protein, 25-35% energy from fat, 35-60% energy from carbohydrates, less than 278m of dietary cholesterol, 20-35 gm of total fiber daily;Understanding of distribution of calorie intake throughout the day with the  consumption of 4-5 meals/snacks   Sedentary Yes   Intervention Provide advice, education, support and counseling about physical activity/exercise needs.;Develop an individualized exercise prescription for aerobic and resistive training based on initial evaluation findings, risk stratification, comorbidities and participant's personal goals.   Expected Outcomes Achievement of increased cardiorespiratory fitness and enhanced flexibility, muscular endurance and strength shown through measurements of functional capacity and personal statement of participant.   Increase Strength and Stamina Yes   Intervention Provide advice, education, support and counseling about physical activity/exercise needs.;Develop an individualized exercise prescription for aerobic and resistive training based on initial evaluation findings, risk stratification, comorbidities and participant's personal goals.   Expected Outcomes Achievement of increased cardiorespiratory fitness and enhanced flexibility, muscular endurance and strength shown through measurements of functional capacity and personal statement of participant.   Diabetes Yes   Intervention Provide education about signs/symptoms and action to take for hypo/hyperglycemia.;Provide education about proper nutrition, including hydration, and aerobic/resistive exercise prescription along with prescribed medications to  achieve blood glucose in normal ranges: Fasting glucose 65-99 mg/dL   Expected Outcomes Short Term: Participant verbalizes understanding of the signs/symptoms and immediate care of hyper/hypoglycemia, proper foot care and importance of medication, aerobic/resistive exercise and nutrition plan for blood glucose control.;Long Term: Attainment of HbA1C < 7%.   Hypertension Yes   Intervention Monitor prescription use compliance.;Provide education on lifestyle modifcations including regular physical activity/exercise, weight management, moderate sodium restriction and  increased consumption of fresh fruit, vegetables, and low fat dairy, alcohol moderation, and smoking cessation.   Expected Outcomes Short Term: Continued assessment and intervention until BP is < 140/49m HG in hypertensive participants. < 130/826mHG in hypertensive participants with diabetes, heart failure or chronic kidney disease.;Long Term: Maintenance of blood pressure at goal levels.   Stress Yes   Intervention Offer individual and/or small group education and counseling on adjustment to heart disease, stress management and health-related lifestyle change. Teach and support self-help strategies.;Refer participants experiencing significant psychosocial distress to appropriate mental health specialists for further evaluation and treatment. When possible, include family members and significant others in education/counseling sessions.   Expected Outcomes Short Term: Participant demonstrates changes in health-related behavior, relaxation and other stress management skills, ability to obtain effective social support, and compliance with psychotropic medications if prescribed.;Long Term: Emotional wellbeing is indicated by absence of clinically significant psychosocial distress or social isolation.      Core Components/Risk Factors/Patient Goals Review:    Core Components/Risk Factors/Patient Goals at Discharge (Final Review):    ITP Comments:     ITP Comments    Row Name 02/10/16 1231 02/10/16 1235 02/26/16 0659       ITP Comments LaGurshanaid he does not want to go back to his current job since it is very stressful. LaTakashitates that he has applied for disability and is already on long term disability.  ITP created during Medical Review. Documentation of diagnosis of CABG in discharge note of 11/20/2015.  LaLogonaid he quit smoking November 03, 2015 and has had no problems with that.  30 day review completed for review by Dr MaEmily Filbert Continue with ITP unless changes noted by Dr MISabra Heck        Comments:

## 2016-02-28 DIAGNOSIS — Z951 Presence of aortocoronary bypass graft: Secondary | ICD-10-CM | POA: Diagnosis not present

## 2016-02-28 DIAGNOSIS — I214 Non-ST elevation (NSTEMI) myocardial infarction: Secondary | ICD-10-CM

## 2016-02-28 NOTE — Progress Notes (Signed)
Daily Session Note  Patient Details  Name: RAAHIM SHARTZER MRN: [id number] Date of Birth: 12-19-1950 Referring Provider:   Flowsheet Row Cardiac Rehab from 02/10/2016 in [facility] Cardiac and Pulmonary Rehab  Referring Provider  Ida Rogue MD      Encounter Date: 02/28/2016  Check In:     Session Check In - 02/28/16 0856      Check-In   Location [facility]-Cardiac [facility]monary Rehab   Staff Present Heath Lark, RN, BSN, CCRP;Jessica Brookneal, MA, ACSM RCEP, Exercise Physiologist;Amanda Oletta Darter, BA, ACSM CEP, Exercise Physiologist   Supervising physician immediately available to respond to emergencies See telemetry face sheet for immediately available ER MD   Medication changes reported     No   Fall or balance concerns reported    No   Warm-up and Cool-down Performed on first and last piece of equipment   Resistance Training Performed Yes   VAD Patient? No     Pain Assessment   Currently in Pain? No/denies   Multiple Pain Sites No         Goals Met:  Independence with exercise equipment Exercise tolerated well No report of cardiac concerns or symptoms Strength training completed today  Goals Unmet:  Not Applicable  Comments: Pt able to follow exercise prescription today without complaint.  Will continue to monitor for progression.    Dr. Emily Filbert is Medical Director for [place] and [facility].

## 2016-03-02 ENCOUNTER — Encounter: Payer: Managed Care, Other (non HMO) | Admitting: *Deleted

## 2016-03-02 ENCOUNTER — Ambulatory Visit (INDEPENDENT_AMBULATORY_CARE_PROVIDER_SITE_OTHER): Payer: Managed Care, Other (non HMO) | Admitting: Cardiovascular Disease

## 2016-03-02 ENCOUNTER — Encounter: Payer: Self-pay | Admitting: Cardiovascular Disease

## 2016-03-02 VITALS — BP 140/77 | HR 71 | Ht 67.0 in | Wt 182.0 lb

## 2016-03-02 DIAGNOSIS — I10 Essential (primary) hypertension: Secondary | ICD-10-CM | POA: Diagnosis not present

## 2016-03-02 DIAGNOSIS — Z951 Presence of aortocoronary bypass graft: Secondary | ICD-10-CM

## 2016-03-02 DIAGNOSIS — I480 Paroxysmal atrial fibrillation: Secondary | ICD-10-CM | POA: Diagnosis not present

## 2016-03-02 DIAGNOSIS — I214 Non-ST elevation (NSTEMI) myocardial infarction: Secondary | ICD-10-CM

## 2016-03-02 DIAGNOSIS — I700 Atherosclerosis of aorta: Secondary | ICD-10-CM

## 2016-03-02 DIAGNOSIS — E782 Mixed hyperlipidemia: Secondary | ICD-10-CM

## 2016-03-02 DIAGNOSIS — I6522 Occlusion and stenosis of left carotid artery: Secondary | ICD-10-CM

## 2016-03-02 DIAGNOSIS — IMO0002 Reserved for concepts with insufficient information to code with codable children: Secondary | ICD-10-CM

## 2016-03-02 DIAGNOSIS — I25111 Atherosclerotic heart disease of native coronary artery with angina pectoris with documented spasm: Secondary | ICD-10-CM

## 2016-03-02 DIAGNOSIS — E1165 Type 2 diabetes mellitus with hyperglycemia: Secondary | ICD-10-CM

## 2016-03-02 DIAGNOSIS — E1159 Type 2 diabetes mellitus with other circulatory complications: Secondary | ICD-10-CM

## 2016-03-02 DIAGNOSIS — I951 Orthostatic hypotension: Secondary | ICD-10-CM

## 2016-03-02 DIAGNOSIS — F1010 Alcohol abuse, uncomplicated: Secondary | ICD-10-CM

## 2016-03-02 NOTE — Progress Notes (Signed)
Cardiology Office Note  Date:  03/02/2016   ID:  Brian Casey, DOB 05/22/1950, MRN [id number]  PCP:  [name], MD   Chief Complaint  Patient presents with  . other    86mof/u. Pt states he has had 4 falls since last visit. Pt c/o dizzness and dropping bp when standing. Reviewed meds with pt verbally.    HPI:  Brian DOVIDIOis a 65y.o. male with history of diagnosed CAD s/p 4-vessel CABG,  recently  s/p LICA endarerectomy 84/09/81 PAF not on full-dose anticoagulation, CKD stage II, DM2,  HTN, HLD, hypothyroidism, tobacco abuse for 48 years, and chronic alcohol abuse presents to clinic for follow up of his CAD  [facility] with a 3-day history of RUQ pain He underwent CT abdomen/pelvis that showed acute acalculus cholecystitis.  He was also noted to be in new onset Afib with RVR His troponin peaked at 0.11. His Afib was rate controlled and ultimately converted to sinus rhythm on IV amiodarone.   Echo on 8/14 showed an EF of 50-55%, no RWMA, GR2DD, PASP normal.  He underwent Lexiscan Myoview on 11/06/15 that was high-risk with moderate sized region of moderate intensity in the lateral wall, EF 68%.  LHC showed severe 4-vessel CAD.   [facility].  He underwent successful 4-vessel CABG on 11/14/2015 with LIMA to LAD, SVG to Diag, SVG to RI, SVG to PDA.    found to have severe LICA stenosis at 819-14%and underwent successful left carotid endarterectomy on 8/24. He had 60-79% RICA stenosis.   gallbladder drain placed  Falling, 5 x Lightheadedness, Legs give out, with dizziness Home health noticed blood pressure drop, now no longer going to house (since October)  Orthostatics here today with drop after 3 minutes, blood pressure 130s over 70s laying sitting and standing, after 3 minutes blood pressure drop 94/65, heart rate increased from 70s up to 99 after 3 minutes  Drinking beer, but not very much Lost weight during his  surgeries as above  EKG on today's visit shows normal sinus rhythm with rate 71 bpm, PVCs in a bigeminal pattern, nonspecific ST abnormality    PMH:   has a past medical history of Acute renal failure ([place] ([place]06[place]); Anxiety; Arthritis; Depression; GERD (gastroesophageal reflux disease); High cholesterol; History of bronchitis; Hypertension (10/17/2013); Hyperthyroidism; Hypothyroidism; New onset atrial fibrillation ([place] (11/06/2015); NSTEMI (non-ST elevated myocardial infarction) ([place] (11/07[place]2015); Thyroid disease; Type II diabetes mellitus ([place]; and Wears glasses.  PSH:    Past Surgical History:  Procedure Laterality Date  . CARDIAC CATHETERIZATION Right 11/06/2015   Procedure: Left Heart Cath and Coronary Angiography;  Surgeon: TMinna Merritts MD;  [facility];  Service: Cardiovascular;  Laterality: Right;  . CHOLECYSTECTOMY N/A 01/17/2016   Procedure: LAPAROSCOPIC CHOLECYSTECTOMY WITH INTRAOPERATIVE CHOLANGIOGRAM;  Surgeon: HFanny Skates MD;  [place]  Service: General;  Laterality: N/A;  . COLONOSCOPY    . CORONARY ARTERY BYPASS GRAFT N/A 11/14/2015   Procedure: CORONARY ARTERY BYPASS GRAFTING (CABG) x 4;  Surgeon: PIvin Poot MD;  [place] [facility]  Service: Open Heart Surgery;  Laterality: N/A;  . ENDARTERECTOMY Left 11/14/2015   Procedure: LEFT ENDARTERECTOMY CAROTID;  Surgeon: BWaynetta Sandy MD;  [place] [facility]  Service: Vascular;  Laterality: Left;  . FINGER GANGLION CYST EXCISION Left X 3   "index finger X 2; thumb X 1"  . [place] . HERNIA REPAIR    .  INSERTION OF MESH N/A 11/16/2013   Procedure: INSERTION OF MESH;  Surgeon: Imogene Burn. Georgette Dover, MD;  Location: [place];  Service: General;  Laterality: N/A;  . IR GENERIC HISTORICAL  11/08/2015   IR PERC CHOLECYSTOSTOMY 11/08/2015 Corrie Mckusick, DO [facility]  . IR GENERIC HISTORICAL  12/13/2015   IR CHOLANGIOGRAM EXISTING TUBE 12/13/2015 [facility]INTER[facility]  . IR GENERIC  HISTORICAL  12/25/2015   IR CATHETER TUBE CHANGE 12/25/2015 Sandi Mariscal, MD [facility]  . TEE WITHOUT CARDIOVERSION N/A 11/14/2015   Procedure: TRANSESOPHAGEAL ECHOCARDIOGRAM (TEE);  Surgeon: Ivin Poot, MD;  Location: [place];  Service: Open Heart Surgery;  Laterality: N/A;  . UMBILICAL HERNIA REPAIR N/A 11/16/2013   Procedure: UMBILICAL HERNIA REPAIR;  Surgeon: Imogene Burn. Georgette Dover, MD;  Location: [place];  Service: General;  Laterality: N/A;  . VEIN HARVEST Right 11/14/2015   Procedure: RIGHT LEG GREATER SAPHENOUS VEIN HARVEST;  Surgeon: Ivin Poot, MD;  Location: [place];  Service: Open Heart Surgery;  Laterality: Right;    Current Outpatient Prescriptions  Medication Sig Dispense Refill  . aspirin 325 MG tablet Take 325 mg by mouth daily.    Marland Kitchen escitalopram (LEXAPRO) 10 MG tablet Take 1 tablet (10 mg total) by mouth daily. 30 tablet 0  . levothyroxine (SYNTHROID, LEVOTHROID) 100 MCG tablet Take 1 tablet (100 mcg total) by mouth daily before breakfast. OFFICE VISIT WITH  LABS REQUIRED FOR ADDITIONAL REFILLS 30 tablet 0  . metFORMIN (GLUCOPHAGE) 500 MG tablet Take 500 mg by mouth 2 (two) times daily with a meal.    . metoprolol tartrate (LOPRESSOR) 25 MG tablet Take 0.5 tablets (12.5 mg total) by mouth 2 (two) times daily. 90 tablet 0  . omeprazole (PRILOSEC) 20 MG capsule Take 1 capsule (20 mg total) by mouth daily. OFFICE VISIT REQUIRED FOR ADDITIONAL REFILLS 30 capsule 0  . atorvastatin (LIPITOR) 40 MG tablet Take 1 tablet (40 mg total) by mouth daily. (Patient taking differently: Take 40 mg by mouth every evening. ) 90 tablet 3   No current facility-administered medications for this visit.      Allergies:   Penicillins and Bupropion hcl   Social History:  The patient  reports that he has quit smoking. His smoking use included Cigarettes. He has a 5.76 pack-year smoking history. He has never used smokeless tobacco. He reports that he drinks about 34.2 oz of alcohol per week . He reports  that he does not use drugs.   Family History:   family history includes Cancer in his father, maternal grandmother, and mother; Colon cancer in his mother; Depression in his mother; Diabetes in his maternal grandmother and mother; Hypertension in his father and mother; Hyperthyroidism in his father.    Review of Systems: Review of Systems  Constitutional: Negative.   Respiratory: Negative.   Cardiovascular: Negative.   Gastrointestinal: Negative.   Musculoskeletal: Negative.   Neurological: Positive for dizziness.  Psychiatric/Behavioral: Negative.   All other systems reviewed and are negative.    PHYSICAL EXAM: VS:  BP 140/77 (BP Location: Left Arm, Patient Position: Sitting, Cuff Size: Normal)   Pulse 71   Ht _0  (1.702 m)   Wt 182 lb (82.6 kg)   BMI 28.51 kg/m  , BMI Body mass index is 28.51 kg/m. GEN: Well nourished, well developed, in no acute distress  HEENT: normal  Neck: no JVD, carotid bruits, or masses Cardiac: RRR; no murmurs, rubs, or gallops,no edema , well healed mediastinal scar Respiratory:  clear to  auscultation bilaterally, normal work of breathing GI: soft, nontender, nondistended, + BS MS: no deformity or atrophy  Skin: warm and dry, no rash Neuro:  Strength and sensation are intact Psych: euthymic mood, full affect    Recent Labs: 11/08/2015: B Natriuretic Peptide 907.9 11/09/2015: TSH 4.905 11/15/2015: Magnesium 2.5 01/15/2016: ALT 29; BUN 8; Creatinine, Ser 1.08; Hemoglobin 13.1; Platelets 294; Potassium 4.0; Sodium 137    Lipid Panel Lab Results  Component Value Date   CHOL 150 10/30/2014   HDL 42.30 10/30/2014   LDLCALC 88 10/30/2014   TRIG 437 (H) 11/09/2015      Wt Readings from Last 3 Encounters:  03/02/16 182 lb (82.6 kg)  02/10/16 178 lb (80.7 kg)  01/17/16 176 lb (79.8 kg)       ASSESSMENT AND PLAN:  Mixed hyperlipidemia Encouraged him to stay on Lipitor 40 mg daily  Orthostasis Notes from today indicating drop in  blood pressure after 3 minutes, likely causing orthostasis symptoms. Blood pressure readings difficult in the setting of PVCs in a bigeminal pattern. Recommended he increase his fluids, consider compression hose, back brace if he continues to have orthostasis symptoms.   PAF (paroxysmal atrial fibrillation) (HCC) Maintaining normal sinus rhythm on today's visit  Coronary artery disease involving native coronary artery of native heart with angina pectoris with documented spasm (HCC)  Aortic atherosclerosis (HCC)  NSTEMI (non-ST elevated myocardial infarction) ([place])  Stenosis of left carotid artery Recent carotid endarterectomy, recovered well  Uncontrolled type 2 diabetes mellitus with other circulatory complication, without long-term current use of insulin (HCC)  S/P CABG x 4 He has been released to go back to work by cardiothoracic surgery as of December 1 Denies any symptoms concerning for angina Recommended he continue cardiac rehabilitation, finish this program until he goes back to work  Alcohol abuse Recommended alcohol cessation Reports that he does not drink very much   Total encounter time more than 25 minutes  Greater than 50% was spent in counseling and coordination of care with the patient   Disposition:   F/U  1 months   No orders of the defined types were placed in this encounter.    Signed, Esmond Plants, M.D., Ph.D. 03/02/2016  [place], [place]

## 2016-03-02 NOTE — Progress Notes (Signed)
Daily Session Note  Patient Details  Name: Brian Casey MRN: [id number] Date of Birth: 1950/08/03 Referring Provider:   Flowsheet Row Cardiac Rehab from 02/10/2016 in [facility] Cardiac and Pulmonary Rehab  Referring Provider  Ida Rogue MD      Encounter Date: 03/02/2016  Check In:     Session Check In - 03/02/16 0750      Check-In   Location [facility]   Staff Present Gerlene Burdock, RN, Moises Blood, BS, ACSM CEP, Exercise Physiologist;Jessica Luan Pulling, [place], ACSM RCEP, Exercise Physiologist   Supervising physician immediately available to respond to emergencies See telemetry face sheet for immediately available ER MD   Medication changes reported     No   Fall or balance concerns reported    No   Warm-up and Cool-down Performed on first and last piece of equipment   Resistance Training Performed Yes   VAD Patient? No     Pain Assessment   Currently in Pain? No/denies   Multiple Pain Sites No         Goals Met:  Independence with exercise equipment Exercise tolerated well No report of cardiac concerns or symptoms Strength training completed today  Goals Unmet:  Not Applicable  Comments: Pt able to follow exercise prescription today without complaint.  Will continue to monitor for progression.    Dr. Emily Filbert is Medical Director for [place] and [facility].

## 2016-03-02 NOTE — Patient Instructions (Addendum)
Medication Instructions:   No medication changes made  Drink fluids, more salt Compression hose, back brace  Cardiac rehab 3 x per week  Labwork:  No new labs needed  Testing/Procedures:  No further testing at this time   I recommend watching educational videos on topics of interest to you at:       [email or web address].goemmi.com  Enter code: HEARTCARE    Follow-Up: It was a pleasure seeing you in the office today. Please call us if you have new issues that need to be addressed before your next appt.  [phone]  Your physician wants you to follow-up in: 1 month.    If you need a refill on your cardiac medications before your next appointment, please call your pharmacy.

## 2016-03-04 DIAGNOSIS — Z951 Presence of aortocoronary bypass graft: Secondary | ICD-10-CM

## 2016-03-04 DIAGNOSIS — I214 Non-ST elevation (NSTEMI) myocardial infarction: Secondary | ICD-10-CM

## 2016-03-04 NOTE — Progress Notes (Signed)
Daily Session Note  Patient Details  Name: Brian Casey MRN: [id number] Date of Birth: [birth year] Referring Provider:   Flowsheet Row Cardiac Rehab from 02/10/2016 in [facility] Cardiac and Pulmonary Rehab  Referring Provider  Ida Rogue MD      Encounter Date: 03/04/2016  Check In:     Session Check In - 03/04/16 0851      Check-In   Location [facility]   Staff Present [name], MA, ACSM RCEP, Exercise Physiologist;Kingslee Dowse Oletta Darter, BA, ACSM CEP, Exercise Physiologist;Other  Jena Gauss RN   Supervising physician immediately available to respond to emergencies See telemetry face sheet for immediately available ER MD   Medication changes reported     No   Fall or balance concerns reported    No   Warm-up and Cool-down Performed on first and last piece of equipment   Resistance Training Performed Yes   VAD Patient? No     Pain Assessment   Currently in Pain? No/denies   Multiple Pain Sites No         Goals Met:  Independence with exercise equipment Exercise tolerated well No report of cardiac concerns or symptoms Strength training completed today  Goals Unmet:  Not Applicable  Comments: Pt able to follow exercise prescription today without complaint.  Will continue to monitor for progression.    Dr. Emily Filbert is Medical Director for [place] and [facility].

## 2016-03-05 ENCOUNTER — Encounter: Payer: Self-pay | Admitting: Dietician

## 2016-03-06 ENCOUNTER — Encounter: Payer: Managed Care, Other (non HMO) | Admitting: *Deleted

## 2016-03-06 DIAGNOSIS — I214 Non-ST elevation (NSTEMI) myocardial infarction: Secondary | ICD-10-CM

## 2016-03-06 DIAGNOSIS — Z951 Presence of aortocoronary bypass graft: Secondary | ICD-10-CM

## 2016-03-06 NOTE — Progress Notes (Signed)
Daily Session Note  Patient Details  Name: [name] MRN: [id number] Date of Birth: 24-Apr-1950 Referring Provider:   Flowsheet Row Cardiac Rehab from 02/10/2016 in [facility] Cardiac and Pulmonary Rehab  Referring Provider  Ida Rogue MD      Encounter Date: 03/06/2016  Check In:     Session Check In - 03/06/16 0858      Check-In   Staff Present Heath Lark, RN, BSN, CCRP;Mary Kellie Shropshire, RN, BSN, Willette Pa, MA, ACSM RCEP, Exercise Physiologist   Supervising physician immediately available to respond to emergencies See telemetry face sheet for immediately available ER MD   Medication changes reported     No   Fall or balance concerns reported    No   Warm-up and Cool-down Performed on first and last piece of equipment   Resistance Training Performed Yes   VAD Patient? No     Pain Assessment   Currently in Pain? No/denies         Goals Met:  Exercise tolerated well No report of cardiac concerns or symptoms Strength training completed today  Goals Unmet:  Not Applicable  Comments: Doing well with exercise prescription progression.    Dr. Emily Filbert is Medical Director for [place] and [facility].

## 2016-03-09 ENCOUNTER — Encounter: Payer: Managed Care, Other (non HMO) | Admitting: *Deleted

## 2016-03-09 DIAGNOSIS — I214 Non-ST elevation (NSTEMI) myocardial infarction: Secondary | ICD-10-CM

## 2016-03-09 DIAGNOSIS — Z951 Presence of aortocoronary bypass graft: Secondary | ICD-10-CM | POA: Diagnosis not present

## 2016-03-09 NOTE — Progress Notes (Signed)
Daily Session Note  Patient Details  Name: Brian Casey MRN: [id number] Date of Birth: 20-Dec-1950 Referring Provider:   Flowsheet Row Cardiac Rehab from 02/10/2016 in [facility] Cardiac and Pulmonary Rehab  Referring Provider  Ida Rogue MD      Encounter Date: 03/09/2016  Check In:     Session Check In - 03/09/16 0755      Check-In   Location [facility]   Staff Present Nyoka Cowden, RN, BSN, Willette Pa, MA, ACSM RCEP, Exercise Physiologist;Kelly Amedeo Plenty, BS, ACSM CEP, Exercise Physiologist   Supervising physician immediately available to respond to emergencies See telemetry face sheet for immediately available ER MD   Medication changes reported     No   Fall or balance concerns reported    No   Warm-up and Cool-down Performed on first and last piece of equipment   Resistance Training Performed Yes   VAD Patient? No     Pain Assessment   Currently in Pain? No/denies   Multiple Pain Sites No         Goals Met:  Independence with exercise equipment Exercise tolerated well No report of cardiac concerns or symptoms Strength training completed today  Goals Unmet:  Not Applicable  Comments: Pt able to follow exercise prescription today without complaint.  Will continue to monitor for progression.    Dr. Emily Filbert is Medical Director for [place] and [facility].

## 2016-03-11 DIAGNOSIS — Z951 Presence of aortocoronary bypass graft: Secondary | ICD-10-CM

## 2016-03-11 DIAGNOSIS — I214 Non-ST elevation (NSTEMI) myocardial infarction: Secondary | ICD-10-CM

## 2016-03-11 LAB — GLUCOSE, CAPILLARY: Glucose-Capillary: 183 mg/dL — ABNORMAL HIGH (ref 65–99)

## 2016-03-11 NOTE — Progress Notes (Signed)
Daily Session Note  Patient Details  Name: Brian Casey MRN: [id number] Date of Birth: 06/27/1950 Referring Provider:   Flowsheet Row Cardiac Rehab from 02/10/2016 in [facility] Cardiac and Pulmonary Rehab  Referring Provider  Ida Rogue MD      Encounter Date: 03/11/2016  Check In:     Session Check In - 03/11/16 0811      Check-In   Location [facility]   Staff Present [name], MA, ACSM RCEP, Exercise Physiologist;Amanda Oletta Darter, BA, ACSM CEP, Exercise Physiologist;Other  Jena Gauss RN   Supervising physician immediately available to respond to emergencies See telemetry face sheet for immediately available ER MD   Medication changes reported     No   Fall or balance concerns reported    No   Warm-up and Cool-down Performed on first and last piece of equipment   Resistance Training Performed Yes   VAD Patient? No     Pain Assessment   Currently in Pain? No/denies   Multiple Pain Sites No         Goals Met:  Independence with exercise equipment Exercise tolerated well Personal goals reviewed No report of cardiac concerns or symptoms Strength training completed today  Goals Unmet:  Not Applicable  Comments: Pt able to follow exercise prescription today without complaint.  Will continue to monitor for progression.    Dr. Emily Filbert is Medical Director for [place] and [facility].

## 2016-03-12 DIAGNOSIS — Z951 Presence of aortocoronary bypass graft: Secondary | ICD-10-CM | POA: Diagnosis not present

## 2016-03-12 DIAGNOSIS — I214 Non-ST elevation (NSTEMI) myocardial infarction: Secondary | ICD-10-CM

## 2016-03-12 NOTE — Progress Notes (Signed)
Daily Session Note  Patient Details  Name: [name] MRN: [id number] Date of Birth: 04/28/1950 Referring Provider:   Flowsheet Row Cardiac Rehab from 02/10/2016 in [facility] Cardiac and Pulmonary Rehab  Referring Provider  Ida Rogue MD      Encounter Date: 03/12/2016  Check In:     Session Check In - 03/12/16 0855      Check-In   Location [facility]   Staff Present [name], MA, ACSM RCEP, Exercise Physiologist;Amanda Oletta Darter, BA, ACSM CEP, Exercise Physiologist;Patricia Surles RN BSN   Supervising physician immediately available to respond to emergencies See telemetry face sheet for immediately available ER MD   Medication changes reported     No   Fall or balance concerns reported    No   Warm-up and Cool-down Performed on first and last piece of equipment   Resistance Training Performed Yes   VAD Patient? No     Pain Assessment   Currently in Pain? No/denies   Multiple Pain Sites No         Goals Met:  Independence with exercise equipment Exercise tolerated well No report of cardiac concerns or symptoms Strength training completed today  Goals Unmet:  Not Applicable  Comments: Pt able to follow exercise prescription today without complaint.  Will continue to monitor for progression.    Dr. Emily Filbert is Medical Director for [place] and [facility].

## 2016-03-13 ENCOUNTER — Encounter: Payer: Managed Care, Other (non HMO) | Admitting: *Deleted

## 2016-03-13 DIAGNOSIS — I214 Non-ST elevation (NSTEMI) myocardial infarction: Secondary | ICD-10-CM

## 2016-03-13 DIAGNOSIS — Z951 Presence of aortocoronary bypass graft: Secondary | ICD-10-CM

## 2016-03-13 NOTE — Progress Notes (Signed)
Daily Session Note  Patient Details  Name: Brian Casey MRN: [id number] Date of Birth: Jul 24, 1950 Referring Provider:   Flowsheet Row Cardiac Rehab from 02/10/2016 in [facility] Cardiac and Pulmonary Rehab  Referring Provider  Ida Rogue MD      Encounter Date: 03/13/2016  Check In:     Session Check In - 03/13/16 0849      Check-In   Location [facility]   Staff Present Gerlene Burdock, RN, BSN;Susanne Bice, RN, BSN, CCRP;Jessica Luan Pulling, MA, ACSM RCEP, Exercise Physiologist   Supervising physician immediately available to respond to emergencies See telemetry face sheet for immediately available ER MD   Medication changes reported     No   Fall or balance concerns reported    No   Warm-up and Cool-down Performed on first and last piece of equipment   Resistance Training Performed Yes   VAD Patient? No     Pain Assessment   Currently in Pain? No/denies         Goals Met:  Proper associated with RPD/PD & O2 Sat Exercise tolerated well  Goals Unmet:  Not Applicable  Comments:     Dr. Emily Filbert is Medical Director for [place] and [facility].

## 2016-03-17 ENCOUNTER — Encounter: Payer: Managed Care, Other (non HMO) | Admitting: Respiratory Therapy

## 2016-03-17 DIAGNOSIS — I214 Non-ST elevation (NSTEMI) myocardial infarction: Secondary | ICD-10-CM

## 2016-03-17 DIAGNOSIS — Z951 Presence of aortocoronary bypass graft: Secondary | ICD-10-CM | POA: Diagnosis not present

## 2016-03-17 NOTE — Progress Notes (Signed)
Daily Session Note  Patient Details  Name: Brian Casey MRN: [id number] Date of Birth: Jul 20, 1950 Referring Provider:   Flowsheet Row Cardiac Rehab from 02/10/2016 in [facility] Cardiac and Pulmonary Rehab  Referring Provider  Ida Rogue MD      Encounter Date: 03/17/2016  Check In:     Session Check In - 03/17/16 1001      Check-In   Location [facility]   Staff Present [name], MA, ACSM RCEP, Exercise Physiologist;Susanne Bice, RN, BSN, CCRP;Laureen Owens Shark, BS, RRT, Respiratory Therapist   Supervising physician immediately available to respond to emergencies See telemetry face sheet for immediately available ER MD   Medication changes reported     No   Fall or balance concerns reported    No   Warm-up and Cool-down Performed on first and last piece of equipment   Resistance Training Performed Yes   VAD Patient? No     Pain Assessment   Currently in Pain? No/denies   Multiple Pain Sites No         Goals Met:  Proper associated with RPD/PD & O2 Sat Independence with exercise equipment Exercise tolerated well No report of cardiac concerns or symptoms Strength training completed today  Goals Unmet:  Not Applicable  Comments: Pt able to follow exercise prescription today without complaint.  Will continue to monitor for progression.   Dr. Emily Filbert is Medical Director for [place] and [facility].

## 2016-03-18 ENCOUNTER — Encounter: Payer: Managed Care, Other (non HMO) | Admitting: *Deleted

## 2016-03-18 DIAGNOSIS — I214 Non-ST elevation (NSTEMI) myocardial infarction: Secondary | ICD-10-CM

## 2016-03-18 DIAGNOSIS — Z951 Presence of aortocoronary bypass graft: Secondary | ICD-10-CM

## 2016-03-18 NOTE — Progress Notes (Signed)
Daily Session Note  Patient Details  Name: NORFLEET CAPERS MRN: [id number] Date of Birth: [birth year] Referring Provider:   Flowsheet Row Cardiac Rehab from 02/10/2016 in [facility]  Referring Provider  Ida Rogue MD      Encounter Date: 03/18/2016  Check In:     Session Check In - 03/18/16 0853      Check-In   Staff Present Gerlene Burdock, RN, BSN;Allina Riches, RN, BSN, CCRP;Jessica Luan Pulling, MA, ACSM RCEP, Exercise Physiologist   Supervising physician immediately available to respond to emergencies See telemetry face sheet for immediately available ER MD   Medication changes reported     No   Fall or balance concerns reported    No   Warm-up and Cool-down Performed on first and last piece of equipment   Resistance Training Performed Yes   VAD Patient? No     Pain Assessment   Currently in Pain? No/denies         Goals Met:  Exercise tolerated well No report of cardiac concerns or symptoms Strength training completed today  Goals Unmet:  Not Applicable  Comments: Doing well with exercise prescription progression.    Dr. Emily Filbert is Medical Director for [place] and [facility].

## 2016-03-19 ENCOUNTER — Encounter: Payer: Managed Care, Other (non HMO) | Admitting: *Deleted

## 2016-03-19 DIAGNOSIS — I214 Non-ST elevation (NSTEMI) myocardial infarction: Secondary | ICD-10-CM

## 2016-03-19 DIAGNOSIS — Z951 Presence of aortocoronary bypass graft: Secondary | ICD-10-CM

## 2016-03-19 NOTE — Progress Notes (Signed)
Daily Session Note  Patient Details  Name: Brian Casey MRN: [id number] Date of Birth: May 31, 1950 Referring Provider:   Flowsheet Row Cardiac Rehab from 02/10/2016 in [facility] Cardiac and Pulmonary Rehab  Referring Provider  Ida Rogue MD      Encounter Date: 03/19/2016  Check In:     Session Check In - 03/19/16 1018      Check-In   Location [facility]-Cardiac & Pulmonary Rehab   Staff Present Heath Lark, RN, BSN, CCRP;Jessica Luan Pulling, [place], ACSM RCEP, Exercise Physiologist;Julian Medina RN BSN   Supervising physician immediately available to respond to emergencies See telemetry face sheet for immediately available ER MD   Medication changes reported     No   Fall or balance concerns reported    No   Warm-up and Cool-down Performed on first and last piece of equipment   Resistance Training Performed Yes   VAD Patient? No     Pain Assessment   Currently in Pain? No/denies   Multiple Pain Sites No           Exercise Prescription Changes - 03/18/16 1600      Exercise Review   Progression Yes     Response to Exercise   Blood Pressure (Admit) 106/64   Blood Pressure (Exercise) 132/74   Blood Pressure (Exit) 142/70   Heart Rate (Admit) 83 bpm   Heart Rate (Exercise) 120 bpm   Heart Rate (Exit) 83 bpm   Rating of Perceived Exertion (Exercise) 13   Symptoms none   Comments Home Exercise Guidelines given 02/24/16   Duration Progress to 45 minutes of aerobic exercise without signs/symptoms of physical distress   Intensity THRR unchanged     Progression   Progression Continue progressive overload as per policy without signs/symptoms or physical distress.   Average METs 3.25     Resistance Training   Training Prescription Yes   Weight 3lbs   Reps 10-12     Interval Training   Interval Training No     Treadmill   MPH 2.5   Grade 1   Minutes 15   METs 3.26     REL-XR   Level 2   Minutes 15   METs 4.1     T5 Nustep   Level 4   Minutes 15   METs 2.4      Home Exercise Plan   Plans to continue exercise at Home  walking   Frequency Add 2 additional days to program exercise sessions.      Goals Met:  Proper associated with RPD/PD & O2 Sat Independence with exercise equipment Exercise tolerated well No report of cardiac concerns or symptoms Strength training completed today  Goals Unmet:  Not Applicable  Comments: Pt able to follow exercise prescription today without complaint.  Will continue to monitor for progression.    Dr. Emily Filbert is Medical Director for [place] and [facility].

## 2016-03-20 ENCOUNTER — Encounter: Payer: Managed Care, Other (non HMO) | Admitting: *Deleted

## 2016-03-20 DIAGNOSIS — Z951 Presence of aortocoronary bypass graft: Secondary | ICD-10-CM

## 2016-03-20 DIAGNOSIS — I214 Non-ST elevation (NSTEMI) myocardial infarction: Secondary | ICD-10-CM

## 2016-03-20 NOTE — Progress Notes (Signed)
Daily Session Note  Patient Details  Name: Brian Casey MRN: [id number] Date of Birth: [birth year] Referring Provider:   Flowsheet Row Cardiac Rehab from 02/10/2016 in [facility] Cardiac and Pulmonary Rehab  Referring Provider  Ida Rogue MD      Encounter Date: 03/20/2016  Check In:     Session Check In - 03/20/16 0820      Check-In   Location [facility]-Cardiac & Pulmonary Rehab   Staff Present [name], MA, ACSM RCEP, Exercise Physiologist;Rebecca Brayton El, DPT, CEEA;Carroll Enterkin, RN, BSN   Supervising physician immediately available to respond to emergencies See telemetry face sheet for immediately available ER MD   Medication changes reported     No   Fall or balance concerns reported    No   Warm-up and Cool-down Performed on first and last piece of equipment   Resistance Training Performed Yes   VAD Patient? No     Pain Assessment   Currently in Pain? No/denies   Multiple Pain Sites No         Goals Met:  Independence with exercise equipment Exercise tolerated well No report of cardiac concerns or symptoms Strength training completed today  Goals Unmet:  Not Applicable  Comments: Pt able to follow exercise prescription today without complaint.  Will continue to monitor for progression.    Dr. Emily Filbert is Medical Director for [place] and [facility].

## 2016-03-24 ENCOUNTER — Encounter: Payer: Self-pay | Admitting: *Deleted

## 2016-03-24 DIAGNOSIS — I214 Non-ST elevation (NSTEMI) myocardial infarction: Secondary | ICD-10-CM

## 2016-03-24 DIAGNOSIS — Z951 Presence of aortocoronary bypass graft: Secondary | ICD-10-CM

## 2016-03-24 NOTE — Progress Notes (Signed)
Cardiac Individual Treatment Plan  Patient Details  Name: Brian Casey MRN: [id number] Date of Birth: [birth year] Referring Provider:   Flowsheet Row Cardiac Rehab from 02/10/2016 in [facility] Cardiac and Pulmonary Rehab  Referring Provider  Brian Rogue MD      Initial Encounter Date:  Flowsheet Row Cardiac Rehab from 02/10/2016 in [facility] Cardiac and Pulmonary Rehab  Date  02/10/16  Referring Provider  Brian Rogue MD      Visit Diagnosis: NSTEMI (non-ST elevated myocardial infarction) ([place])  S/P CABG x 4  Patient's Home Medications on Admission:  Current Outpatient Prescriptions:  .  aspirin 325 MG tablet, Take 325 mg by mouth daily., Disp: , Rfl:  .  atorvastatin (LIPITOR) 40 MG tablet, Take 1 tablet (40 mg total) by mouth daily. (Patient taking differently: Take 40 mg by mouth every evening. ), Disp: 90 tablet, Rfl: 3 .  escitalopram (LEXAPRO) 10 MG tablet, Take 1 tablet (10 mg total) by mouth daily., Disp: 30 tablet, Rfl: 0 .  levothyroxine (SYNTHROID, LEVOTHROID) 100 MCG tablet, Take 1 tablet (100 mcg total) by mouth daily before breakfast. OFFICE VISIT WITH  LABS REQUIRED FOR ADDITIONAL REFILLS, Disp: 30 tablet, Rfl: 0 .  metFORMIN (GLUCOPHAGE) 500 MG tablet, Take 500 mg by mouth 2 (two) times daily with a meal., Disp: , Rfl:  .  metoprolol tartrate (LOPRESSOR) 25 MG tablet, Take 0.5 tablets (12.5 mg total) by mouth 2 (two) times daily., Disp: 90 tablet, Rfl: 0 .  omeprazole (PRILOSEC) 20 MG capsule, Take 1 capsule (20 mg total) by mouth daily. OFFICE VISIT REQUIRED FOR ADDITIONAL REFILLS, Disp: 30 capsule, Rfl: 0  Past Medical History: Past Medical History:  Diagnosis Date  . Acute renal failure ([place]) 11/06/2015   Brian Casey 11/06/2015  . Anxiety   . Arthritis    "lower spine; fingers" (11/07/2015)  . Depression   . GERD (gastroesophageal reflux disease)   . High cholesterol   . History of bronchitis   . Hypertension 10/17/2013  . Hyperthyroidism    "had it radiated in  his '61s"  . Hypothyroidism   . New onset atrial fibrillation ([place]) 11/06/2015   Brian Casey 11/07/2015  . NSTEMI (non-ST elevated myocardial infarction) ([place]) 11/07/2015  . Thyroid disease   . Type II diabetes mellitus ([place])    type II  . Wears glasses     Tobacco Use: History  Smoking Status  . Former Smoker  . Packs/day: 0.12  . Years: 48.00  . Types: Cigarettes  Smokeless Tobacco  . Never Used    Comment: quit smoking 11/01/15    Labs: Recent Review Flowsheet Data    Labs for ITP Cardiac and Pulmonary Rehab Latest Ref Rng & Units 11/15/2015 11/15/2015 11/15/2015 11/15/2015 01/15/2016   Cholestrol 0 - 200 mg/dL - - - - -   LDLCALC 0 - 99 mg/dL - - - - -   LDLDIRECT mg/dL - - - - -   HDL >39.00 mg/dL - - - - -   Trlycerides <150 mg/dL - - - - -   Hemoglobin A1c 4.8 - 5.6 % - - - - 5.5   PHART 7.350 - 7.450 7.352 - - - -   PCO2ART 35.0 - 45.0 mmHg 38.4 - - - -   HCO3 20.0 - 24.0 mEq/L 21.4 - - - -   TCO2 0 - 100 mmol/L 23 - 22 25 -   ACIDBASEDEF 0.0 - 2.0 mmol/L 4.0(H) - - - -   O2SAT % 95.0 61.7 - - -  Exercise Target Goals:    Exercise Program Goal: Individual exercise prescription set with THRR, safety & activity barriers. Participant demonstrates ability to understand and report RPE using BORG scale, to self-measure pulse accurately, and to acknowledge the importance of the exercise prescription.  Exercise Prescription Goal: Starting with aerobic activity 30 plus minutes a day, 3 days per week for initial exercise prescription. Provide home exercise prescription and guidelines that participant acknowledges understanding prior to discharge.  Activity Barriers & Risk Stratification:     Activity Barriers & Cardiac Risk Stratification - 02/10/16 1229      Activity Barriers & Cardiac Risk Stratification   Activity Barriers History of Falls;Balance Concerns;Back Problems;Arthritis   Cardiac Risk Stratification High      6 Minute Walk:     6 Minute Walk     Row Name 02/10/16 1516         6 Minute Walk   Phase Initial     Distance 1210 feet     Walk Time 6 minutes     # of Rest Breaks 0     MPH 2.29     METS 3.1     RPE 12     VO2 Peak 10.86     Symptoms No     Resting HR 60 bpm     Resting BP 142/70     Max Ex. HR 85 bpm     Max Ex. BP 144/70     2 Minute Post BP 126/70        Initial Exercise Prescription:     Initial Exercise Prescription - 02/10/16 1500      Date of Initial Exercise RX and Referring Provider   Date 02/10/16   Referring Provider Brian Rogue MD     Treadmill   MPH 2   Grade 0.5   Minutes 15   METs 2.67     REL-XR   Level 2   Watts --  speed 50 rpm   Minutes 15   METs 2     T5 Nustep   Level 2   Watts --  80-100 spm   Minutes 15   METs 2     Prescription Details   Frequency (times per week) 3   Duration Progress to 45 minutes of aerobic exercise without signs/symptoms of physical distress     Intensity   THRR 40-80% of Max Heartrate 99-138   Ratings of Perceived Exertion 11-15   Perceived Dyspnea 0-4     Progression   Progression Continue to progress workloads to maintain intensity without signs/symptoms of physical distress.     Resistance Training   Training Prescription Yes   Weight 3lbs   Reps 10-12      Perform Capillary Blood Glucose checks as needed.  Exercise Prescription Changes:     Exercise Prescription Changes    Row Name 02/10/16 1500 02/19/16 1000 02/24/16 0900 03/04/16 1600 03/18/16 1600     Exercise Review   Progression -  walk test results Yes Yes Yes Yes     Response to Exercise   Blood Pressure (Admit) 142/70 128/64  - 130/68 106/64   Blood Pressure (Exercise) 144/70 130/74  - 130/64 132/74   Blood Pressure (Exit) 126/70 116/52  - 120/64 142/70   Heart Rate (Admit) 60 bpm 75 bpm  - 72 bpm 83 bpm   Heart Rate (Exercise) 85 bpm 115 bpm  - 106 bpm 120 bpm   Heart Rate (Exit) 66 bpm 73 bpm  -  68 bpm 83 bpm   Oxygen Saturation (Admit) 99 %  -  -   -  -   Oxygen Saturation (Exercise) 97 %  -  -  -  -   Rating of Perceived Exertion (Exercise) 12 12  - 12 13   Symptoms _0    Comments  -  - Home Exercise Guidelines given 02/24/16 Home Exercise Guidelines given 02/24/16 Home Exercise Guidelines given 02/24/16   Duration  - Progress to 45 minutes of aerobic exercise without signs/symptoms of physical distress Progress to 45 minutes of aerobic exercise without signs/symptoms of physical distress Progress to 45 minutes of aerobic exercise without signs/symptoms of physical distress Progress to 45 minutes of aerobic exercise without signs/symptoms of physical distress   Intensity  - THRR unchanged THRR unchanged THRR unchanged THRR unchanged     Progression   Progression  - Continue progressive overload as per policy without signs/symptoms or physical distress. Continue progressive overload as per policy without signs/symptoms or physical distress. Continue progressive overload as per policy without signs/symptoms or physical distress. Continue progressive overload as per policy without signs/symptoms or physical distress.   Average METs  - 2.85 2.85 3.15 3.25     Resistance Training   Training Prescription  - Yes Yes Yes Yes   Weight  - 3lbs 3lbs 3lbs 3lbs   Reps  - 10-12 10-12 10-12 10-12     Interval Training   Interval Training  - No No No No     Treadmill   MPH  - 2.5 2.5 2.5 2.5   Grade  - 1.5 1._1 Minutes  - _2 METs  - 3.26 3.26 3.26 3.26     REL-XR   Level  - _3 Minutes  - _4 METs  - 2.7 2.7 4.1 4.1     T5 Nustep   Level  - _5 Minutes  - _6 METs  - 2.6 2.6 2.1 2.4     Home Exercise Plan   Plans to continue exercise at  -  - Home  walking Home  walking Home  walking   Frequency  -  - Add 2 additional days to program exercise sessions. Add 2 additional days to program exercise sessions. Add 2 additional days to program exercise sessions.       Exercise Comments:     Exercise Comments    Row Name 02/10/16 1616 02/12/16 1049 02/19/16 1039 02/24/16 0921 03/04/16 1556   Exercise Comments Brian Casey wants to be more functional.  He wants to be able to help his family more with working around the house, yard work, and self care.  First full day of exercise!  Patient was oriented to gym and equipment including functions, settings, policies, and procedures.  Patient's individual exercise prescription and treatment plan were reviewed.  All starting workloads were established based on the results of the 6 minute walk test done at initial orientation visit.  The plan for exercise progression was also introduced and progression will be customized based on patient's performance and goals. Brian Casey has had a good three full days of exercise.  He is already starting to make some progress. We will continue to track his progression. Reviewed home exercise with pt today.  Pt plans to walk at home for exercise.  Reviewed THR, pulse, RPE, sign  and symptoms, and when to call 911 or MD.  Also discussed weather considerations and indoor options.  Pt voiced understanding. Brian Casey continues to do well in rehab.  He is up to 4.1 METs on the XR.  We will continue to monitor his progression.   Brian Casey Name 03/18/16 1614 03/20/16 0837         Exercise Comments Brian Casey is doing well in rehab.  He is now attending 4-5 days a week to make sure he gets all of his sessions done quickly.  He is enjoying the exercise and likes coming daily.  We will continue to monitor his progression. Reviewed METs average and discussed progression with pt today.         Discharge Exercise Prescription (Final Exercise Prescription Changes):     Exercise Prescription Changes - 03/18/16 1600      Exercise Review   Progression Yes     Response to Exercise   Blood Pressure (Admit) 106/64   Blood Pressure (Exercise) 132/74   Blood Pressure (Exit) 142/70   Heart Rate (Admit) 83 bpm   Heart Rate  (Exercise) 120 bpm   Heart Rate (Exit) 83 bpm   Rating of Perceived Exertion (Exercise) 13   Symptoms none   Comments Home Exercise Guidelines given 02/24/16   Duration Progress to 45 minutes of aerobic exercise without signs/symptoms of physical distress   Intensity THRR unchanged     Progression   Progression Continue progressive overload as per policy without signs/symptoms or physical distress.   Average METs 3.25     Resistance Training   Training Prescription Yes   Weight 3lbs   Reps 10-12     Interval Training   Interval Training No     Treadmill   MPH 2.5   Grade 1   Minutes 15   METs 3.26     REL-XR   Level 2   Minutes 15   METs 4.1     T5 Nustep   Level 4   Minutes 15   METs 2.4     Home Exercise Plan   Plans to continue exercise at Home  walking   Frequency Add 2 additional days to program exercise sessions.      Nutrition:  Target Goals: Understanding of nutrition guidelines, daily intake of sodium <1575m, cholesterol <2062m calories 30% from fat and 7% or less from saturated fats, daily to have 5 or more servings of fruits and vegetables.  Biometrics:     Pre Biometrics - 02/10/16 1617      Pre Biometrics   Height _0  (1.727 m)   Weight 178 lb (80.7 kg)   Waist Circumference 37.5 inches   Hip Circumference 39 inches   Waist to Hip Ratio 0.96 %   BMI (Calculated) 27.1   Single Leg Stand 6.85 seconds       Nutrition Therapy Plan and Nutrition Goals:     Nutrition Therapy & Goals - 03/05/16 1147      Nutrition Therapy   Diet heart healthy diabetes diet   Drug/Food Interactions Statins/Certain Fruits   Protein (specify units) 8oz   Fiber 30 grams   Whole Grain Foods 3 servings   Saturated Fats 14 max. grams   Fruits and Vegetables 5 servings/day   Sodium 2000 grams     Personal Nutrition Goals   Personal Goal #1 Prepare meats by sauteeing in a small amount of oil, or oven-fry, bake, or grill.   Personal Goal #2 Include a  protein  source with each meal; try nuts, peanut butter, lowfat cheese, or beans (except green beans), 1cup beans = 2oz protein.    Comments Patient has eaten fried foods his entire life and does not like most grilled meats. He sometimes eats only fruit for meals. Instructed on appropriate protein and carb balance in meals.      Intervention Plan   Intervention Prescribe, educate and counsel regarding individualized specific dietary modifications aiming towards targeted core components such as weight, hypertension, lipid management, diabetes, heart failure and other comorbidities.;Nutrition handout(s) given to patient.   Expected Outcomes Short Term Goal: Understand basic principles of dietary content, such as calories, fat, sodium, cholesterol and nutrients.;Short Term Goal: A plan has been developed with personal nutrition goals set during dietitian appointment.;Long Term Goal: Adherence to prescribed nutrition plan.      Nutrition Discharge: Rate Your Plate Scores:     Nutrition Assessments - 03/05/16 1152      Rate Your Plate Scores   Pre Score 63  reassessed initial score   Pre Score % 70 %      Nutrition Goals Re-Evaluation:     Nutrition Goals Re-Evaluation    [place] Name 03/11/16 0911             Personal Goal #1 Re-Evaluation   Personal Goal #1 Prepare meats by sauteeing in a small amount of oil, or oven-fry, bake, or grill.       Goal Progress Seen Yes       Comments Still eating out some, talked about cutting back.  He is ordering less fried food.           Personal Goal #2 Re-Evaluation   Personal Goal #2 Include a protein source with each meal; try nuts, peanut butter, lowfat cheese, or beans (except green beans), 1cup beans = 2oz protein.        Goal Progress Seen Yes       Comments He is eating more protein.  He still needs to add a protein to his lunch meal.         Intervention Plan   Intervention Continue to educate, counsel and set short/long term goals  regarding individualized specific personal dietary modifications.       Comments Brian Casey is going to continue to work on his diet and adding in more protein options.          Psychosocial: Target Goals: Acknowledge presence or absence of depression, maximize coping skills, provide positive support system. Participant is able to verbalize types and ability to use techniques and skills needed for reducing stress and depression.  Initial Review & Psychosocial Screening:     Initial Psych Review & Screening - 02/10/16 1235      Family Dynamics   Good Support System? Yes     Barriers   Psychosocial barriers to participate in program The patient should benefit from training in stress management and relaxation.     Screening Interventions   Interventions Encouraged to exercise      Quality of Life Scores:     Quality of Life - 02/10/16 1236      Quality of Life Scores   Health/Function Pre 18.96 %   Socioeconomic Pre 26.28 %   Psych/Spiritual Pre 24 %   Family Pre 26.4 %   GLOBAL Pre 22.81 %      PHQ-9: Recent Review Flowsheet Data    Depression screen [facility] 2/9 02/10/2016   Decreased Interest 1   Down, Depressed, Hopeless 0  PHQ - 2 Score 1   Altered sleeping 0   Tired, decreased energy 0   Change in appetite 1   Feeling bad or failure about yourself  1   Trouble concentrating 0   Moving slowly or fidgety/restless 0   Suicidal thoughts 0   PHQ-9 Score 3   Difficult doing work/chores Not difficult at all      Psychosocial Evaluation and Intervention:     Psychosocial Evaluation - 02/12/16 0949      Psychosocial Evaluation & Interventions   Interventions Encouraged to exercise with the program and follow exercise prescription;Stress management education;Relaxation education   Comments Counselor met with Brian Casey (Brian Casey) today for initial psychosocial evaluation.  He is a 66 year old who had a CABGx4 in August and recently had his gall bladder removed as well.   He is a diabetic and on medications for this.  Brian Casey has a strong support system with a spouse of 74 years; a daughter and son who live close by and 4 grandchildren that are his pride and joy.  He reports his appetite is finally improving with the removal of his gall bladder - but he lost 40 pounds since since heart surgery in August as a result.  He is sleeping well and reports he is typically in a positive mood.  Brian Casey does admit to a history of depression and anxiety with an overwhelmingly stressful job with unrealistic demands that he reports was "killing me."  Brian Casey is on medications now for depression and anxiety and states it is maintaining his current symptoms.  He was a smoker and drinker to cope prior to his surgery; but he is learning new coping skills now with exercise and engaging in healthy hobbies.  He has goals to increase his stamina and strength while in this program.  Staff will continue to follow with him.  He will benefit from the psychoeducational components of this program - particularly stress management.        Psychosocial Re-Evaluation:     Psychosocial Re-Evaluation    Row Name 03/11/16 [phone] 03/18/16 0904           Psychosocial Re-Evaluation   Interventions Encouraged to attend Cardiac Rehabilitation for the exercise;Stress management education  -      Comments Brian Casey's mood has been great over the past few months since he has not been working.  His anxiety levels have been down too.  He has committed to not going back to work and will either retire or apply for long term disability.  He is getting out to do more too and sleeping well. Counselor follow up with Brian Casey today reporting the holidays went well and although the scales indicated he enjoyed the food too much, he is confident he will get that weight back off quickly working out in this program.  Brian Casey states he is able to raise his arms above his head now with all of the exercises he has been doing here.  He feels  stronger and more energetic since coming into this program and stated he almost feels "back to normal."  His mood remains positive and he admits to coping with stress well - even over the holidays.   Counselor commended Brian Casey on his hard work and commitment to exercise and accomplishing his goals.           Vocational Rehabilitation: Provide vocational rehab assistance to qualifying candidates.   Vocational Rehab Evaluation & Intervention:  Vocational Rehab - 02/10/16 1230      Initial Vocational Rehab Evaluation & Intervention   Assessment shows need for Vocational Rehabilitation No      Education: Education Goals: Education classes will be provided on a weekly basis, covering required topics. Participant will state understanding/return demonstration of topics presented.  Learning Barriers/Preferences:     Learning Barriers/Preferences - 02/10/16 1229      Learning Barriers/Preferences   Learning Barriers None   Learning Preferences None      Education Topics: General Nutrition Guidelines/Fats and Fiber: -Group instruction provided by verbal, written material, models and posters to present the general guidelines for heart healthy nutrition. Gives an explanation and review of dietary fats and fiber.   Controlling Sodium/Reading Food Labels: -Group verbal and written material supporting the discussion of sodium use in heart healthy nutrition. Review and explanation with models, verbal and written materials for utilization of the food label.   Exercise Physiology & Risk Factors: - Group verbal and written instruction with models to review the exercise physiology of the cardiovascular system and associated critical values. Details cardiovascular disease risk factors and the goals associated with each risk factor.   Aerobic Exercise & Resistance Training: - Gives group verbal and written discussion on the health impact of inactivity. On the components of aerobic and  resistive training programs and the benefits of this training and how to safely progress through these programs. Flowsheet Row Cardiac Rehab from 03/12/2016 in [facility] Cardiac and Pulmonary Rehab  Date  (P) 02/17/16  Educator  (P) [facility]  Instruction Review Code  (P) 2- meets goals/outcomes      Flexibility, Balance, General Exercise Guidelines: - Provides group verbal and written instruction on the benefits of flexibility and balance training programs. Provides general exercise guidelines with specific guidelines to those with heart or lung disease. Demonstration and skill practice provided. Flowsheet Row Cardiac Rehab from 03/12/2016 in [facility] Cardiac and Pulmonary Rehab  Date  (P) 02/19/16  Educator  (P) AS  Instruction Review Code  (P) 2- meets goals/outcomes      Stress Management: - Provides group verbal and written instruction about the health risks of elevated stress, cause of high stress, and healthy ways to reduce stress. Flowsheet Row Cardiac Rehab from 03/12/2016 in [facility] Cardiac and Pulmonary Rehab  Date  (P) 03/04/16  Educator  (P) [place]  Instruction Review Code  (P) 2- meets goals/outcomes      Depression: - Provides group verbal and written instruction on the correlation between heart/lung disease and depressed mood, treatment options, and the stigmas associated with seeking treatment.   Anatomy & Physiology of the Heart: - Group verbal and written instruction and models provide basic cardiac anatomy and physiology, with the coronary electrical and arterial systems. Review of: AMI, Angina, Valve disease, Heart Failure, Cardiac Arrhythmia, Pacemakers, and the ICD. Flowsheet Row Cardiac Rehab from 03/12/2016 in [facility] Cardiac and Pulmonary Rehab  Date  (P) 02/24/16  Educator  (P) TS  Instruction Review Code  (P) 2- meets goals/outcomes      Cardiac Procedures: - Group verbal and written instruction and models to describe the testing methods done to diagnose heart disease.  Reviews the outcomes of the test results. Describes the treatment choices: Medical Management, Angioplasty, or Coronary Bypass Surgery. Flowsheet Row Cardiac Rehab from 03/12/2016 in [facility] Cardiac and Pulmonary Rehab  Date  (P) 03/02/16  Educator  (P) CE  Instruction Review Code  (P) 2- meets goals/outcomes      Cardiac Medications: -  Group verbal and written instruction to review commonly prescribed medications for heart disease. Reviews the medication, class of the drug, and side effects. Includes the steps to properly store meds and maintain the prescription regimen. Flowsheet Row Cardiac Rehab from 03/12/2016 in [facility] Cardiac and Pulmonary Rehab  Date  (P) 03/11/16 [part 2]  Educator  (P) TS  Instruction Review Code  (P) 2- meets goals/outcomes      Go Sex-Intimacy & Heart Disease, Get SMART - Goal Setting: - Group verbal and written instruction through game format to discuss heart disease and the return to sexual intimacy. Provides group verbal and written material to discuss and apply goal setting through the application of the S.M.A.R.T. Method. Flowsheet Row Cardiac Rehab from 03/12/2016 in [facility] Cardiac and Pulmonary Rehab  Date  (P) 03/02/16  Educator  (P) CE  Instruction Review Code  (P) 2- meets goals/outcomes      Other Matters of the Heart: - Provides group verbal, written materials and models to describe Heart Failure, Angina, Valve Disease, and Diabetes in the realm of heart disease. Includes description of the disease process and treatment options available to the cardiac patient. Flowsheet Row Cardiac Rehab from 03/12/2016 in [facility] Cardiac and Pulmonary Rehab  Date  (P) 02/24/16  Educator  (P) TS  Instruction Review Code  (P) 2- meets goals/outcomes      Exercise & Equipment Safety: - Individual verbal instruction and demonstration of equipment use and safety with use of the equipment. Flowsheet Row Cardiac Rehab from 03/12/2016 in [facility] Cardiac and Pulmonary Rehab   Date  (P) 02/10/16  Educator  (P) Brian Casey. Casey  Instruction Review Code  (P) 1- partially meets, needs review/practice      Infection Prevention: - Provides verbal and written material to individual with discussion of infection control including proper hand washing and proper equipment cleaning during exercise session. Flowsheet Row Cardiac Rehab from 03/12/2016 in [facility] Cardiac and Pulmonary Rehab  Date  (P) 02/10/16  Educator  (P) Brian Casey  Instruction Review Code  (P) 1- partially meets, needs review/practice      Falls Prevention: - Provides verbal and written material to individual with discussion of falls prevention and safety. Flowsheet Row Cardiac Rehab from 03/12/2016 in [facility] [facility]  Date  (P) 02/10/16  Educator  (P) Brian Hazy, Brian Casey  Instruction Review Code  (P) 1- partially meets, needs review/practice      Diabetes: - Individual verbal and written instruction to review signs/symptoms of diabetes, desired ranges of glucose level fasting, after meals and with exercise. Advice that pre and post exercise glucose checks will be done for 3 sessions at entry of program. Flowsheet Row Cardiac Rehab from 03/12/2016 in [facility] Cardiac and Pulmonary Rehab  Date  (P) 02/10/16  Educator  (P) Brian Casey. Casey  Instruction Review Code  (P) 1- partially meets, needs review/practice       Knowledge Questionnaire Score:     Knowledge Questionnaire Score - 02/10/16 1230      Knowledge Questionnaire Score   Pre Score 22      Core Components/Risk Factors/Patient Goals at Admission:     Personal Goals and Risk Factors at Admission - 02/10/16 1233      Core Components/Risk Factors/Patient Goals on Admission    Weight Management Yes;Weight Loss   Intervention Weight Management: Develop a combined nutrition and exercise program designed to reach desired caloric intake, while maintaining appropriate intake of nutrient and fiber, sodium and fats, and  appropriate energy  expenditure required for the weight goal.;Weight Management: Provide education and appropriate resources to help participant work on and attain dietary goals.   Admit Weight 178 lb (80.7 kg)   Goal Weight: Short Term 175 lb (79.4 kg)   Goal Weight: Long Term 168 lb (76.2 kg)   Expected Outcomes Short Term: Continue to assess and modify interventions until short term weight is achieved;Long Term: Adherence to nutrition and physical activity/exercise program aimed toward attainment of established weight goal;Weight Loss: Understanding of general recommendations for a balanced deficit meal plan, which promotes 1-2 lb weight loss per week and includes a negative energy balance of [phone] kcal/d;Understanding recommendations for meals to include 15-35% energy as protein, 25-35% energy from fat, 35-60% energy from carbohydrates, less than 213m of dietary cholesterol, 20-35 gm of total fiber daily;Understanding of distribution of calorie intake throughout the day with the consumption of 4-5 meals/snacks   Sedentary Yes   Intervention Provide advice, education, support and counseling about physical activity/exercise needs.;Develop an individualized exercise prescription for aerobic and resistive training based on initial evaluation findings, risk stratification, comorbidities and participant's personal goals.   Expected Outcomes Achievement of increased cardiorespiratory fitness and enhanced flexibility, muscular endurance and strength shown through measurements of functional capacity and personal statement of participant.   Increase Strength and Stamina Yes   Intervention Provide advice, education, support and counseling about physical activity/exercise needs.;Develop an individualized exercise prescription for aerobic and resistive training based on initial evaluation findings, risk stratification, comorbidities and participant's personal goals.   Expected Outcomes Achievement of increased  cardiorespiratory fitness and enhanced flexibility, muscular endurance and strength shown through measurements of functional capacity and personal statement of participant.   Diabetes Yes   Intervention Provide education about signs/symptoms and action to take for hypo/hyperglycemia.;Provide education about proper nutrition, including hydration, and aerobic/resistive exercise prescription along with prescribed medications to achieve blood glucose in normal ranges: Fasting glucose 65-99 mg/dL   Expected Outcomes Short Term: Participant verbalizes understanding of the signs/symptoms and immediate care of hyper/hypoglycemia, proper foot care and importance of medication, aerobic/resistive exercise and nutrition plan for blood glucose control.;Long Term: Attainment of HbA1C < 7%.   Hypertension Yes   Intervention Monitor prescription use compliance.;Provide education on lifestyle modifcations including regular physical activity/exercise, weight management, moderate sodium restriction and increased consumption of fresh fruit, vegetables, and low fat dairy, alcohol moderation, and smoking cessation.   Expected Outcomes Short Term: Continued assessment and intervention until BP is < 140/912mHG in hypertensive participants. < 130/8057mG in hypertensive participants with diabetes, heart failure or chronic kidney disease.;Long Term: Maintenance of blood pressure at goal levels.   Stress Yes   Intervention Offer individual and/or small group education and counseling on adjustment to heart disease, stress management and health-related lifestyle change. Teach and support self-help strategies.;Refer participants experiencing significant psychosocial distress to appropriate mental health specialists for further evaluation and treatment. When possible, include family members and significant others in education/counseling sessions.   Expected Outcomes Short Term: Participant demonstrates changes in health-related  behavior, relaxation and other stress management skills, ability to obtain effective social support, and compliance with psychotropic medications if prescribed.;Long Term: Emotional wellbeing is indicated by absence of clinically significant psychosocial distress or social isolation.      Core Components/Risk Factors/Patient Goals Review:      Goals and Risk Factor Review    Row Name 03/11/16 0913             Core Components/Risk Factors/Patient Goals Review   Personal  Goals Review Weight Management/Obesity;Sedentary;Increase Strength and Stamina;Lipids;Stress;Hypertension;Diabetes       Review Brian Casey has been doing well in rehab.  He is enjoying feeling better and being able to do more.  His stress levels have greatly reduced by not being at work.  He is getting out to work in the yard more.  However, he is not going out for a walk.Marland Kitchen  His weight has also been creeping up.  We talked about how watching his diet and exercising will help with weight loss.  His blood sugars and blood pressures have been good.  He had not had any problems with taking his medications.       Expected Outcomes Brian Casey will continue to come to education and exercise classes to work on weight loss and risk factor modifications.          Core Components/Risk Factors/Patient Goals at Discharge (Final Review):      Goals and Risk Factor Review - 03/11/16 0913      Core Components/Risk Factors/Patient Goals Review   Personal Goals Review Weight Management/Obesity;Sedentary;Increase Strength and Stamina;Lipids;Stress;Hypertension;Diabetes   Review Brian Casey has been doing well in rehab.  He is enjoying feeling better and being able to do more.  His stress levels have greatly reduced by not being at work.  He is getting out to work in the yard more.  However, he is not going out for a walk.Marland Kitchen  His weight has also been creeping up.  We talked about how watching his diet and exercising will help with weight loss.  His blood  sugars and blood pressures have been good.  He had not had any problems with taking his medications.   Expected Outcomes Brian Casey will continue to come to education and exercise classes to work on weight loss and risk factor modifications.      ITP Comments:     ITP Comments    Row Name 02/10/16 1231 02/10/16 1235 02/26/16 0659 03/20/16 1137 03/24/16 0609   ITP Comments Brian Casey said he does not want to go back to his current job since it is very stressful. Brian Casey states that he has applied for disability and is already on long term disability.  ITP created during Medical Review. Documentation of diagnosis of CABG in discharge note of 11/20/2015.  Brian Casey said he quit smoking November 03, 2015 and has had no problems with that.  30 day review completed for review by Brian Casey.  Continue with ITP unless changes noted by Brian Sabra Heck. I faxed to Brian. Donivan Scull office rhythm strips of today since when Brian Casey's heart rate is in the 70s he has PVCs. No Brian Casey/o. Blood pressure is stable.  30 day review. Continue with ITP unless directed changes per Medical Director review.      Comments:

## 2016-03-25 ENCOUNTER — Encounter: Payer: BLUE CROSS/BLUE SHIELD | Attending: Cardiovascular Disease

## 2016-03-25 DIAGNOSIS — I214 Non-ST elevation (NSTEMI) myocardial infarction: Secondary | ICD-10-CM

## 2016-03-25 DIAGNOSIS — Z951 Presence of aortocoronary bypass graft: Secondary | ICD-10-CM | POA: Insufficient documentation

## 2016-03-25 NOTE — Progress Notes (Signed)
Daily Session Note  Patient Details  Name: Brian Casey MRN: [id number] Date of Birth: [birth year] Referring Provider:   Flowsheet Row Cardiac Rehab from 02/10/2016 in [facility]  Referring Provider  Ida Rogue MD      Encounter Date: 03/25/2016  Check In:     Session Check In - 03/25/16 0834      Check-In   Location [facility]-Cardiac & Pulmonary Rehab   Staff Present [name], MA, ACSM RCEP, Exercise Physiologist;Patricia Surles RN Vickki Hearing, BA, ACSM CEP, Exercise Physiologist   Supervising physician immediately available to respond to emergencies See telemetry face sheet for immediately available ER MD   Medication changes reported     No   Fall or balance concerns reported    No   Warm-up and Cool-down Performed on first and last piece of equipment   Resistance Training Performed Yes   VAD Patient? No     Pain Assessment   Currently in Pain? No/denies   Multiple Pain Sites No         Goals Met:  Independence with exercise equipment Exercise tolerated well No report of cardiac concerns or symptoms Strength training completed today  Goals Unmet:  Not Applicable  Comments: Pt able to follow exercise prescription today without complaint.  Will continue to monitor for progression.    Dr. Emily Filbert is Medical Director for [facility].

## 2016-03-26 ENCOUNTER — Encounter: Payer: Self-pay | Admitting: Interventional Radiology

## 2016-03-27 ENCOUNTER — Encounter: Payer: BLUE CROSS/BLUE SHIELD | Admitting: *Deleted

## 2016-03-27 DIAGNOSIS — I214 Non-ST elevation (NSTEMI) myocardial infarction: Secondary | ICD-10-CM

## 2016-03-27 DIAGNOSIS — Z951 Presence of aortocoronary bypass graft: Secondary | ICD-10-CM | POA: Diagnosis not present

## 2016-03-27 NOTE — Progress Notes (Signed)
Daily Session Note  Patient Details  Name: Brian Casey MRN: [id number] Date of Birth: April 07, 1950 Referring Provider:   Flowsheet Row Cardiac Rehab from 02/10/2016 in [facility] Cardiac and Pulmonary Rehab  Referring Provider  Ida Rogue MD      Encounter Date: 03/27/2016  Check In:     Session Check In - 03/27/16 0821      Check-In   Location [facility]-Cardiac & Pulmonary Rehab   Staff Present [name], MA, ACSM RCEP, Exercise Physiologist;Carroll Enterkin, RN, Vickki Hearing, BA, ACSM CEP, Exercise Physiologist   Supervising physician immediately available to respond to emergencies See telemetry face sheet for immediately available ER MD   Medication changes reported     No   Fall or balance concerns reported    No   Warm-up and Cool-down Performed on first and last piece of equipment   Resistance Training Performed Yes   VAD Patient? No     Pain Assessment   Currently in Pain? No/denies   Multiple Pain Sites No         Goals Met:  Independence with exercise equipment Exercise tolerated well No report of cardiac concerns or symptoms Strength training completed today  Goals Unmet:  Not Applicable  Comments: Pt able to follow exercise prescription today without complaint.  Will continue to monitor for progression. Worked with Estill Bamberg on interval training mix up today.   Dr. Emily Filbert is Medical Director for [place] [facility].

## 2016-04-01 VITALS — Ht 68.0 in | Wt 185.9 lb

## 2016-04-01 DIAGNOSIS — I214 Non-ST elevation (NSTEMI) myocardial infarction: Secondary | ICD-10-CM

## 2016-04-01 DIAGNOSIS — Z951 Presence of aortocoronary bypass graft: Secondary | ICD-10-CM

## 2016-04-01 NOTE — Patient Instructions (Addendum)
Discharge Instructions  Patient Details  Name: Brian Casey MRN: [id number] Date of Birth: 19-Jul-1950 Referring Provider:  Minna Merritts, MD   Number of Visits: 80  Reason for Discharge:  Patient reached a stable level of exercise. Patient independent in their exercise.  Smoking History:  History  Smoking Status  . Former Smoker  . Packs/day: 0.12  . Years: 48.00  . Types: Cigarettes  Smokeless Tobacco  . Never Used    Comment: quit smoking 11/01/15    Diagnosis:  NSTEMI (non-ST elevated myocardial infarction) ([place])  S/P CABG x 4  Initial Exercise Prescription:     Initial Exercise Prescription - 02/10/16 1500      Date of Initial Exercise RX and Referring Provider   Date 02/10/16   Referring Provider Ida Rogue MD     Treadmill   MPH 2   Grade 0.5   Minutes 15   METs 2.67     REL-XR   Level 2   Watts --  speed 50 rpm   Minutes 15   METs 2     T5 Nustep   Level 2   Watts --  80-100 spm   Minutes 15   METs 2     Prescription Details   Frequency (times per week) 3   Duration Progress to 45 minutes of aerobic exercise without signs/symptoms of physical distress     Intensity   THRR 40-80% of Max Heartrate 99-138   Ratings of Perceived Exertion 11-15   Perceived Dyspnea 0-4     Progression   Progression Continue to progress workloads to maintain intensity without signs/symptoms of physical distress.     Resistance Training   Training Prescription Yes   Weight 3lbs   Reps 10-12      Discharge Exercise Prescription (Final Exercise Prescription Changes):     Exercise Prescription Changes - 04/01/16 1400      Exercise Review   Progression Yes     Response to Exercise   Blood Pressure (Admit) 128/70   Blood Pressure (Exercise) 122/64   Blood Pressure (Exit) 120/64   Heart Rate (Admit) 89 bpm   Heart Rate (Exercise) 119 bpm   Heart Rate (Exit) 77 bpm   Rating of Perceived Exertion (Exercise) 12   Symptoms none   Comments  Home Exercise Guidelines given 02/24/16   Duration Progress to 45 minutes of aerobic exercise without signs/symptoms of physical distress   Intensity THRR unchanged     Progression   Progression Continue progressive overload as per policy without signs/symptoms or physical distress.   Average METs 3.45     Resistance Training   Training Prescription Yes   Weight 6 lbs   Reps 10-15     Interval Training   Interval Training No     Treadmill   MPH 2.5   Grade 1   Minutes 15   METs 3.26     REL-XR   Level 2   Minutes 15   METs 4.5     T5 Nustep   Level 4   Minutes 15   METs 2.6     Home Exercise Plan   Plans to continue exercise at Home  walking   Frequency Add 2 additional days to program exercise sessions.      Functional Capacity:     6 Minute Walk    Row Name 02/10/16 1516 04/01/16 0835       6 Minute Walk   Phase Initial Discharge  Distance 1210 feet 1720 feet    Distance % Change  - 42 %    Walk Time 6 minutes 6 minutes    # of Rest Breaks 0 0    MPH 2.29 3.25    METS 3.1 4.33    RPE 12 17    VO2 Peak 10.86 15.15    Symptoms No No    Resting HR 60 bpm 89 bpm    Resting BP 142/70 128/70    Max Ex. HR 85 bpm 115 bpm    Max Ex. BP 144/70 152/64    2 Minute Post BP 126/70  -       Quality of Life:     Quality of Life - 03/25/16 1412      Quality of Life Scores   Health/Function Pre 18.96 %   Health/Function Post 20.14 %   Health/Function % Change 6.22 %   Socioeconomic Pre 26.28 %   Socioeconomic Post 30 %   Socioeconomic % Change  14.16 %   Psych/Spiritual Pre 24 %   Psych/Spiritual Post 24 %   Psych/Spiritual % Change 0 %   Family Pre 26.4 %   Family Post 25.2 %   Family % Change -4.55 %   GLOBAL Pre 22.81 %   GLOBAL Post 23.63 %   GLOBAL % Change 3.59 %      Personal Goals: Goals established at orientation with interventions provided to work toward goal.     Personal Goals and Risk Factors at Admission - 02/10/16 1233       Core Components/Risk Factors/Patient Goals on Admission    Weight Management Yes;Weight Loss   Intervention Weight Management: Develop a combined nutrition and exercise program designed to reach desired caloric intake, while maintaining appropriate intake of nutrient and fiber, sodium and fats, and appropriate energy expenditure required for the weight goal.;Weight Management: Provide education and appropriate resources to help participant work on and attain dietary goals.   Admit Weight 178 lb (80.7 kg)   Goal Weight: Short Term 175 lb (79.4 kg)   Goal Weight: Long Term 168 lb (76.2 kg)   Expected Outcomes Short Term: Continue to assess and modify interventions until short term weight is achieved;Long Term: Adherence to nutrition and physical activity/exercise program aimed toward attainment of established weight goal;Weight Loss: Understanding of general recommendations for a balanced deficit meal plan, which promotes 1-2 lb weight loss per week and includes a negative energy balance of [phone] kcal/d;Understanding recommendations for meals to include 15-35% energy as protein, 25-35% energy from fat, 35-60% energy from carbohydrates, less than 237m of dietary cholesterol, 20-35 gm of total fiber daily;Understanding of distribution of calorie intake throughout the day with the consumption of 4-5 meals/snacks   Sedentary Yes   Intervention Provide advice, education, support and counseling about physical activity/exercise needs.;Develop an individualized exercise prescription for aerobic and resistive training based on initial evaluation findings, risk stratification, comorbidities and participant's personal goals.   Expected Outcomes Achievement of increased cardiorespiratory fitness and enhanced flexibility, muscular endurance and strength shown through measurements of functional capacity and personal statement of participant.   Increase Strength and Stamina Yes   Intervention Provide advice,  education, support and counseling about physical activity/exercise needs.;Develop an individualized exercise prescription for aerobic and resistive training based on initial evaluation findings, risk stratification, comorbidities and participant's personal goals.   Expected Outcomes Achievement of increased cardiorespiratory fitness and enhanced flexibility, muscular endurance and strength shown through measurements of functional capacity and personal statement  of participant.   Diabetes Yes   Intervention Provide education about signs/symptoms and action to take for hypo/hyperglycemia.;Provide education about proper nutrition, including hydration, and aerobic/resistive exercise prescription along with prescribed medications to achieve blood glucose in normal ranges: Fasting glucose 65-99 mg/dL   Expected Outcomes Short Term: Participant verbalizes understanding of the signs/symptoms and immediate care of hyper/hypoglycemia, proper foot care and importance of medication, aerobic/resistive exercise and nutrition plan for blood glucose control.;Long Term: Attainment of HbA1C < 7%.   Hypertension Yes   Intervention Monitor prescription use compliance.;Provide education on lifestyle modifcations including regular physical activity/exercise, weight management, moderate sodium restriction and increased consumption of fresh fruit, vegetables, and low fat dairy, alcohol moderation, and smoking cessation.   Expected Outcomes Short Term: Continued assessment and intervention until BP is < 140/74m HG in hypertensive participants. < 130/822mHG in hypertensive participants with diabetes, heart failure or chronic kidney disease.;Long Term: Maintenance of blood pressure at goal levels.   Stress Yes   Intervention Offer individual and/or small group education and counseling on adjustment to heart disease, stress management and health-related lifestyle change. Teach and support self-help strategies.;Refer participants  experiencing significant psychosocial distress to appropriate mental health specialists for further evaluation and treatment. When possible, include family members and significant others in education/counseling sessions.   Expected Outcomes Short Term: Participant demonstrates changes in health-related behavior, relaxation and other stress management skills, ability to obtain effective social support, and compliance with psychotropic medications if prescribed.;Long Term: Emotional wellbeing is indicated by absence of clinically significant psychosocial distress or social isolation.       Personal Goals Discharge:     Goals and Risk Factor Review - 03/11/16 0913      Core Components/Risk Factors/Patient Goals Review   Personal Goals Review Weight Management/Obesity;Sedentary;Increase Strength and Stamina;Lipids;Stress;Hypertension;Diabetes   Review LaEilanas been doing well in rehab.  He is enjoying feeling better and being able to do more.  His stress levels have greatly reduced by not being at work.  He is getting out to work in the yard more.  However, he is not going out for a walk.. Marland KitchenHis weight has also been creeping up.  We talked about how watching his diet and exercising will help with weight loss.  His blood sugars and blood pressures have been good.  He had not had any problems with taking his medications.   Expected Outcomes [place] continue to come to education and exercise classes to work on weight loss and risk factor modifications.      Nutrition & Weight - Outcomes:     Pre Biometrics - 02/10/16 1617      Pre Biometrics   Height 5' 8" (1.727 m)   Weight 178 lb (80.7 kg)   Waist Circumference 37.5 inches   Hip Circumference 39 inches   Waist to Hip Ratio 0.96 %   BMI (Calculated) 27.1   Single Leg Stand 6.85 seconds         Post Biometrics - 04/01/16 0834       Post  Biometrics   Height 5' 8" (1.727 m)   Weight 185 lb 14.4 oz (84.3 kg)   Waist Circumference 41  inches   Hip Circumference 41 inches   Waist to Hip Ratio 1 %   BMI (Calculated) 28.3   Single Leg Stand 6.15 seconds      Nutrition:     Nutrition Therapy & Goals - 03/05/16 1147      Nutrition Therapy  Diet heart healthy diabetes diet   Drug/Food Interactions Statins/Certain Fruits   Protein (specify units) 8oz   Fiber 30 grams   Whole Grain Foods 3 servings   Saturated Fats 14 max. grams   Fruits and Vegetables 5 servings/day   Sodium 2000 grams     Personal Nutrition Goals   Personal Goal #1 Prepare meats by sauteeing in a small amount of oil, or oven-fry, bake, or grill.   Personal Goal #2 Include a protein source with each meal; try nuts, peanut butter, lowfat cheese, or beans (except green beans), 1cup beans = 2oz protein.    Comments Patient has eaten fried foods his entire life and does not like most grilled meats. He sometimes eats only fruit for meals. Instructed on appropriate protein and carb balance in meals.      Intervention Plan   Intervention Prescribe, educate and counsel regarding individualized specific dietary modifications aiming towards targeted core components such as weight, hypertension, lipid management, diabetes, heart failure and other comorbidities.;Nutrition handout(s) given to patient.   Expected Outcomes Short Term Goal: Understand basic principles of dietary content, such as calories, fat, sodium, cholesterol and nutrients.;Short Term Goal: A plan has been developed with personal nutrition goals set during dietitian appointment.;Long Term Goal: Adherence to prescribed nutrition plan.      Nutrition Discharge:     Nutrition Assessments - 03/25/16 1411      Rate Your Plate Scores   Pre Score 63   Pre Score % 70 %   Post Score 65   Post Score % 72 %   % Change 2 %      Education Questionnaire Score:     Knowledge Questionnaire Score - 03/25/16 1412      Knowledge Questionnaire Score   Pre Score 22/28   Post Score 28/28       Goals reviewed with patient; copy given to patient.

## 2016-04-01 NOTE — Progress Notes (Signed)
Daily Session Note  Patient Details  Name: Brian Casey MRN: [id number] Date of Birth: 1950-12-01 Referring Provider:   Flowsheet Row Cardiac Rehab from 02/10/2016 in [facility] Cardiac and Pulmonary Rehab  Referring Provider  Ida Rogue MD      Encounter Date: 04/01/2016  Check In:     Session Check In - 04/01/16 0833      Check-In   Location [facility]-Cardiac & Pulmonary Rehab   Staff Present Heath Lark, RN, BSN, CCRP;Jessica Luan Pulling, MA, ACSM RCEP, Exercise Physiologist;Ravindra Baranek Oletta Darter, BA, ACSM CEP, Exercise Physiologist   Supervising physician immediately available to respond to emergencies See telemetry face sheet for immediately available ER MD   Medication changes reported     No   Fall or balance concerns reported    No   Warm-up and Cool-down Performed on first and last piece of equipment   Resistance Training Performed Yes   VAD Patient? No     Pain Assessment   Currently in Pain? No/denies   Multiple Pain Sites No         Goals Met:  Proper associated with RPD/PD & O2 Sat Independence with exercise equipment Exercise tolerated well Strength training completed today  Goals Unmet:  Not Applicable  Comments:      6 Minute Walk    Row Name 02/10/16 1516 04/01/16 0835       6 Minute Walk   Phase Initial Discharge    Distance 1210 feet 1720 feet    Distance % Change  - 42 %    Walk Time 6 minutes 6 minutes    # of Rest Breaks 0 0    MPH 2.29 3.25    METS 3.1 4.33    RPE 12 17    VO2 Peak 10.86 15.15    Symptoms No No    Resting HR 60 bpm 89 bpm    Resting BP 142/70 128/70    Max Ex. HR 85 bpm 115 bpm    Max Ex. BP 144/70 152/64    2 Minute Post BP 126/70  -         Dr. Emily Filbert is Medical Director for [facility].

## 2016-04-02 ENCOUNTER — Encounter: Payer: Self-pay | Admitting: Cardiovascular Disease

## 2016-04-02 ENCOUNTER — Other Ambulatory Visit: Payer: Self-pay | Admitting: Cardiovascular Disease

## 2016-04-02 ENCOUNTER — Ambulatory Visit (INDEPENDENT_AMBULATORY_CARE_PROVIDER_SITE_OTHER): Payer: BLUE CROSS/BLUE SHIELD | Admitting: Cardiovascular Disease

## 2016-04-02 VITALS — BP 162/88 | HR 64 | Ht 68.0 in | Wt 185.8 lb

## 2016-04-02 DIAGNOSIS — I480 Paroxysmal atrial fibrillation: Secondary | ICD-10-CM | POA: Diagnosis not present

## 2016-04-02 NOTE — Patient Instructions (Addendum)
Medication Instructions:   No medication changes made  Labwork:  No new labs needed  Testing/Procedures:  No further testing at this time   I recommend watching educational videos on topics of interest to you at:       [email or web address]  Enter code: [email or web address]    Follow-Up: It was a pleasure seeing you in the office today. Please call us if you have new issues that need to be addressed before your next appt.  [phone]  Your physician wants you to follow-up in: 3 months.  You will receive a reminder letter in the mail two months in advance. If you don't receive a letter, please call our office to schedule the follow-up appointment.  If you need a refill on your cardiac medications before your next appointment, please call your pharmacy.

## 2016-04-02 NOTE — Progress Notes (Signed)
Would recommend he stop aspirin Start eliquis 5 mg twice a day for paroxysmal atrial fibrillation CHADSVASC is 5 Would come in for samples, coupon  Signed, Esmond Plants, MD, Ph.D [facility] 04/02/16

## 2016-04-02 NOTE — Progress Notes (Signed)
Cardiology Office Note  Date:  04/02/2016   ID:  Brian Casey, DOB 07-21-1950, MRN [id number]  PCP:  [name], MD   Chief Complaint  Patient presents with  . other    80mof/u. Pt states he is doing well. Reviewed meds with pt verbally.    HPI:  Brian PUCCIis a 66y.o. male with history of diagnosed CAD s/p 4-vessel CABG,  recently  s/p LICA endarerectomy 84/88/89 PAF not on full-dose anticoagulation, CKD stage II, DM2,  HTN, HLD, hypothyroidism, tobacco abuse for 48 years, and chronic alcohol abuse presents to clinic for follow up of his CAD And paroxysmal atrial fibrillation   He reports that he is participating in Cardiac rehab, Periodic low blood pressure, but much improved Balance poor, but no recent falls Sits down when he feels lightheaded presumably from drop in blood pressure orthostasis is "random", can happen every few days, Drinks lemonade, good amount, and water Weight slowly improving after events from 2017, up 3 pounds in the past 3 months Previously weight 171 pounds, now 185 pounds Balance problems, feels wobbly when he walks, often bumping into walls  He was orthostatic on his last clinic visit On today's visit, blood pressure stable in the 150 range with no drop on standing, he reported being lightheaded though pulse in the 716X systolic pressure 1450T Unclear if he continues to drink alcohol, not discussed with him today though he did recommend alcohol cessation  EKG on today's visit shows normal sinus rhythm with rate 64 bpm, nonspecific ST abnormality  Other past medical history reviewed  [facility] a 3-day history of RUQ pain  CT abdomen/pelvis that showed acute acalculus cholecystitis.  noted to be in new onset Afib with RVR His troponin peaked at 0.11. His Afib was rate controlled and ultimately converted to sinus rhythm on IV amiodarone.   Echo on 8/14 showed an EF of 50-55%, no RWMA, GR2DD, PASP normal.  He underwent  Lexiscan Myoview on 11/06/15 that was high-risk with moderate sized region of moderate intensity in the lateral wall, EF 68%.  LHC showed severe 4-vessel CAD.   [facility].  He underwent successful 4-vessel CABG on 11/14/2015 with LIMA to LAD, SVG to Diag, SVG to RI, SVG to PDA.    found to have severe LICA stenosis at 888-82%and underwent successful left carotid endarterectomy on 8/24. He had 60-79% RICA stenosis.   gallbladder drain placed  PMH:   has a past medical history of Acute renal failure ([place] ([place]06[place]); Anxiety; Arthritis; Depression; GERD (gastroesophageal reflux disease); High cholesterol; History of bronchitis; Hypertension (10/17/2013); Hyperthyroidism; Hypothyroidism; New onset atrial fibrillation [place] (11[place]06[place]); NSTEMI (non-ST elevated myocardial infarction) ([place] (11/07/2015); Thyroid disease; Type II diabetes mellitus ([place]; and Wears glasses.  PSH:    Past Surgical History:  Procedure Laterality Date  . CARDIAC CATHETERIZATION Right 11/06/2015   Procedure: Left Heart Cath and Coronary Angiography;  Surgeon: TMinna Merritts MD;  [facility];  Service: Cardiovascular;  Laterality: Right;  . CHOLECYSTECTOMY N/A 01/17/2016   Procedure: LAPAROSCOPIC CHOLECYSTECTOMY WITH INTRAOPERATIVE CHOLANGIOGRAM;  Surgeon: HFanny Skates MD;  [place]  Service: General;  Laterality: N/A;  . COLONOSCOPY    . CORONARY ARTERY BYPASS GRAFT N/A 11/14/2015   Procedure: CORONARY ARTERY BYPASS GRAFTING (CABG) x 4;  Surgeon: PIvin Poot MD;  [place]  Service: Open Heart Surgery;  Laterality: N/A;  . ENDARTERECTOMY Left  11/14/2015   Procedure: LEFT ENDARTERECTOMY CAROTID;  Surgeon: Waynetta Sandy, MD;  Location: [place];  Service: Vascular;  Laterality: Left;  . FINGER GANGLION CYST EXCISION Left X 3   "index finger X 2; thumb X 1"  . [place]  . HERNIA REPAIR    . INSERTION OF MESH N/A  11/16/2013   Procedure: INSERTION OF MESH;  Surgeon: Imogene Burn. Georgette Dover, MD;  Location: [place];  Service: General;  Laterality: N/A;  . IR GENERIC HISTORICAL  11/08/2015   IR PERC CHOLECYSTOSTOMY 11/08/2015 Corrie Mckusick, DO [facility]  . IR GENERIC HISTORICAL  12/13/2015   IR CHOLANGIOGRAM EXISTING TUBE 12/13/2015 [facility]INTER[facility]  . IR GENERIC HISTORICAL  12/25/2015   IR CATHETER TUBE CHANGE 12/25/2015 Sandi Mariscal, MD [facility]  . IR GENERIC HISTORICAL  12/12/2015   IR RADIOLOGIST EVAL & MGMT 12/12/2015 Greggory Keen, MD [facility]  . TEE WITHOUT CARDIOVERSION N/A 11/14/2015   Procedure: TRANSESOPHAGEAL ECHOCARDIOGRAM (TEE);  Surgeon: Ivin Poot, MD;  Location: [place];  Service: Open Heart Surgery;  Laterality: N/A;  . UMBILICAL HERNIA REPAIR N/A 11/16/2013   Procedure: UMBILICAL HERNIA REPAIR;  Surgeon: Imogene Burn. Georgette Dover, MD;  Location: [place];  Service: General;  Laterality: N/A;  . VEIN HARVEST Right 11/14/2015   Procedure: RIGHT LEG GREATER SAPHENOUS VEIN HARVEST;  Surgeon: Ivin Poot, MD;  Location: [place];  Service: Open Heart Surgery;  Laterality: Right;    Current Outpatient Prescriptions  Medication Sig Dispense Refill  . aspirin 325 MG tablet Take 325 mg by mouth daily.    Marland Kitchen escitalopram (LEXAPRO) 10 MG tablet Take 1 tablet (10 mg total) by mouth daily. 30 tablet 0  . levothyroxine (SYNTHROID, LEVOTHROID) 100 MCG tablet Take 1 tablet (100 mcg total) by mouth daily before breakfast. OFFICE VISIT WITH  LABS REQUIRED FOR ADDITIONAL REFILLS 30 tablet 0  . metFORMIN (GLUCOPHAGE) 500 MG tablet Take 500 mg by mouth 2 (two) times daily with a meal.    . metoprolol tartrate (LOPRESSOR) 25 MG tablet Take 0.5 tablets (12.5 mg total) by mouth 2 (two) times daily. 90 tablet 0  . omeprazole (PRILOSEC) 20 MG capsule Take 1 capsule (20 mg total) by mouth daily. OFFICE VISIT REQUIRED FOR ADDITIONAL REFILLS 30 capsule 0  . atorvastatin (LIPITOR) 40 MG tablet Take 1 tablet (40 mg total) by mouth  daily. (Patient taking differently: Take 40 mg by mouth every evening. ) 90 tablet 3   No current facility-administered medications for this visit.      Allergies:   Penicillins and Bupropion hcl   Social History:  The patient  reports that he has quit smoking. His smoking use included Cigarettes. He has a 5.76 pack-year smoking history. He has never used smokeless tobacco. He reports that he drinks about 34.2 oz of alcohol per week . He reports that he does not use drugs.   Family History:   family history includes Cancer in his father, maternal grandmother, and mother; Colon cancer in his mother; Depression in his mother; Diabetes in his maternal grandmother and mother; Hypertension in his father and mother; Hyperthyroidism in his father.    Review of Systems: Review of Systems  Constitutional: Negative.   Respiratory: Negative.   Cardiovascular: Negative.   Gastrointestinal: Negative.   Musculoskeletal: Negative.   Neurological: Positive for dizziness.  Psychiatric/Behavioral: Negative.   All other systems reviewed and are negative.    PHYSICAL EXAM: VS:  BP (!) 162/88 (BP Location:  Left Arm, Patient Position: Sitting, Cuff Size: Normal)   Pulse 64   Ht 5' 8" (1.727 m)   Wt 185 lb 12 oz (84.3 kg)   BMI 28.24 kg/m  , BMI Body mass index is 28.24 kg/m. GEN: Well nourished, well developed, in no acute distress , balance issues getting from chair to table, did not work prior assistance HEENT: normal  Neck: no JVD, carotid bruits, or masses Cardiac: RRR; no murmurs, rubs, or gallops,no edema , well healed mediastinal scar Respiratory:  clear to auscultation bilaterally, normal work of breathing GI: soft, nontender, nondistended, + BS MS: no deformity or atrophy  Skin: warm and dry, no rash Neuro:  Strength and sensation are intact Psych: euthymic mood, full affect    Recent Labs: 11/08/2015: B Natriuretic Peptide 907.9 11/09/2015: TSH 4.905 11/15/2015: Magnesium  2.5 01/15/2016: ALT 29; BUN 8; Creatinine, Ser 1.08; Hemoglobin 13.1; Platelets 294; Potassium 4.0; Sodium 137    Lipid Panel Lab Results  Component Value Date   CHOL 150 10/30/2014   HDL 42.30 10/30/2014   LDLCALC 88 10/30/2014   TRIG 437 (H) 11/09/2015      Wt Readings from Last 3 Encounters:  04/02/16 185 lb 12 oz (84.3 kg)  04/01/16 185 lb 14.4 oz (84.3 kg)  03/02/16 182 lb (82.6 kg)       ASSESSMENT AND PLAN:  Mixed hyperlipidemia Encouraged him to stay on Lipitor 40 mg daily  Orthostasis Previous orthostasis symptoms When numbers checked today, no drop in pressure to explain his lightheadedness. I'm concerned some of his symptoms, gait instability, balance problems are from long-standing alcohol abuse, neurologic issues, unable to exclude cerebrovascular disease.Based on the above, likely will be unable to go back to work in any significant capacity  PAF (paroxysmal atrial fibrillation) ([place]) CHADs VASC of 5 including hypertension, prior TIA or stroke, vascular disease history, diabetes (patient reports having prior stroke, and there is significant cerebrovascular disease on CT scan) Given the above, will recommend he stop aspirin, start eliquis. No recent falls   Coronary artery disease involving native coronary artery of native heart with angina pectoris with documented spasm (HCC) Currently with no symptoms of angina. No further workup at this time. Continue current medication regimen.  Aortic atherosclerosis (HCC)  we'll continue aggressive cholesterol management   NSTEMI (non-ST elevated myocardial infarction) ([place])  Stenosis of left carotid artery History of carotid endarterectomy  Uncontrolled type 2 diabetes mellitus with other circulatory complication, without long-term current use of insulin (HCC)  S/P CABG x 4 Patient has been completing cardiac rehabilitation   Alcohol abuse  previously reported that he does not drink very much   Total encounter  time more than 25 minutes  Greater than 50% was spent in counseling and coordination of care with the patient   Disposition:   F/U  1 months    Orders Placed This Encounter  Procedures  . EKG 12-Lead     Signed, Esmond Plants, M.D., Ph.D. 04/02/2016  [place], [place]

## 2016-04-03 ENCOUNTER — Encounter: Payer: BLUE CROSS/BLUE SHIELD | Admitting: *Deleted

## 2016-04-03 ENCOUNTER — Telehealth: Payer: Self-pay

## 2016-04-03 DIAGNOSIS — Z951 Presence of aortocoronary bypass graft: Secondary | ICD-10-CM

## 2016-04-03 DIAGNOSIS — I214 Non-ST elevation (NSTEMI) myocardial infarction: Secondary | ICD-10-CM

## 2016-04-03 MED ORDER — APIXABAN 5 MG PO TABS: 5.0000 mg | ORAL_TABLET | Freq: Two times a day (BID) | ORAL | 6 refills | Status: DC

## 2016-04-03 NOTE — Progress Notes (Signed)
Cardiac Individual Treatment Plan  Patient Details  Name: Brian Casey MRN: [id number] Date of Birth: [birth year] Referring Provider:   Flowsheet Row Cardiac Rehab from 02/10/2016 in [facility] Cardiac and Pulmonary Rehab  Referring Provider  Ida Rogue MD      Initial Encounter Date:  Flowsheet Row Cardiac Rehab from 02/10/2016 in [facility] Cardiac and Pulmonary Rehab  Date  02/10/16  Referring Provider  Ida Rogue MD      Visit Diagnosis: NSTEMI (non-ST elevated myocardial infarction) ([place])  S/P CABG x 4  Patient's Home Medications on Admission:  Current Outpatient Prescriptions:  .  aspirin 325 MG tablet, Take 325 mg by mouth daily., Disp: , Rfl:  .  atorvastatin (LIPITOR) 40 MG tablet, Take 1 tablet (40 mg total) by mouth daily. (Patient taking differently: Take 40 mg by mouth every evening. ), Disp: 90 tablet, Rfl: 3 .  escitalopram (LEXAPRO) 10 MG tablet, Take 1 tablet (10 mg total) by mouth daily., Disp: 30 tablet, Rfl: 0 .  levothyroxine (SYNTHROID, LEVOTHROID) 100 MCG tablet, Take 1 tablet (100 mcg total) by mouth daily before breakfast. OFFICE VISIT WITH  LABS REQUIRED FOR ADDITIONAL REFILLS, Disp: 30 tablet, Rfl: 0 .  metFORMIN (GLUCOPHAGE) 500 MG tablet, Take 500 mg by mouth 2 (two) times daily with a meal., Disp: , Rfl:  .  metoprolol tartrate (LOPRESSOR) 25 MG tablet, Take 0.5 tablets (12.5 mg total) by mouth 2 (two) times daily., Disp: 90 tablet, Rfl: 0 .  omeprazole (PRILOSEC) 20 MG capsule, Take 1 capsule (20 mg total) by mouth daily. OFFICE VISIT REQUIRED FOR ADDITIONAL REFILLS, Disp: 30 capsule, Rfl: 0  Past Medical History: Past Medical History:  Diagnosis Date  . Acute renal failure ([place]) 11/06/2015   [name] 11/06/2015  . Anxiety   . Arthritis    "lower spine; fingers" (11/07/2015)  . Depression   . GERD (gastroesophageal reflux disease)   . High cholesterol   . History of bronchitis   . Hypertension 10/17/2013  . Hyperthyroidism    "had it radiated in  his '61s"  . Hypothyroidism   . New onset atrial fibrillation ([place]) 11/06/2015   [name] 11/07/2015  . NSTEMI (non-ST elevated myocardial infarction) ([place]) 11/07/2015  . Thyroid disease   . Type II diabetes mellitus ([place])    type II  . Wears glasses     Tobacco Use: History  Smoking Status  . Former Smoker  . Packs/day: 0.12  . Years: 48.00  . Types: Cigarettes  Smokeless Tobacco  . Never Used    Comment: quit smoking 11/01/15    Labs: Recent Review Flowsheet Data    Labs for ITP Cardiac and Pulmonary Rehab Latest Ref Rng & Units 11/15/2015 11/15/2015 11/15/2015 11/15/2015 01/15/2016   Cholestrol 0 - 200 mg/dL - - - - -   LDLCALC 0 - 99 mg/dL - - - - -   LDLDIRECT mg/dL - - - - -   HDL >39.00 mg/dL - - - - -   Trlycerides <150 mg/dL - - - - -   Hemoglobin A1c 4.8 - 5.6 % - - - - 5.5   PHART 7.350 - 7.450 7.352 - - - -   PCO2ART 35.0 - 45.0 mmHg 38.4 - - - -   HCO3 20.0 - 24.0 mEq/L 21.4 - - - -   TCO2 0 - 100 mmol/L 23 - 22 25 -   ACIDBASEDEF 0.0 - 2.0 mmol/L 4.0(H) - - - -   O2SAT % 95.0 61.7 - - -  Exercise Target Goals:    Exercise Program Goal: Individual exercise prescription set with THRR, safety & activity barriers. Participant demonstrates ability to understand and report RPE using BORG scale, to self-measure pulse accurately, and to acknowledge the importance of the exercise prescription.  Exercise Prescription Goal: Starting with aerobic activity 30 plus minutes a day, 3 days per week for initial exercise prescription. Provide home exercise prescription and guidelines that participant acknowledges understanding prior to discharge.  Activity Barriers & Risk Stratification:     Activity Barriers & Cardiac Risk Stratification - 02/10/16 1229      Activity Barriers & Cardiac Risk Stratification   Activity Barriers History of Falls;Balance Concerns;Back Problems;Arthritis   Cardiac Risk Stratification High      6 Minute Walk:     6 Minute Walk     Row Name 02/10/16 1516 04/01/16 0835       6 Minute Walk   Phase Initial Discharge    Distance 1210 feet 1720 feet    Distance % Change  - 42 %    Walk Time 6 minutes 6 minutes    # of Rest Breaks 0 0    MPH 2.29 3.25    METS 3.1 4.33    RPE 12 17    VO2 Peak 10.86 15.15    Symptoms No No    Resting HR 60 bpm 89 bpm    Resting BP 142/70 128/70    Max Ex. HR 85 bpm 115 bpm    Max Ex. BP 144/70 152/64    2 Minute Post BP 126/70  -       Initial Exercise Prescription:     Initial Exercise Prescription - 02/10/16 1500      Date of Initial Exercise RX and Referring Provider   Date 02/10/16   Referring Provider Ida Rogue MD     Treadmill   MPH 2   Grade 0.5   Minutes 15   METs 2.67     REL-XR   Level 2   Watts --  speed 50 rpm   Minutes 15   METs 2     T5 Nustep   Level 2   Watts --  80-100 spm   Minutes 15   METs 2     Prescription Details   Frequency (times per week) 3   Duration Progress to 45 minutes of aerobic exercise without signs/symptoms of physical distress     Intensity   THRR 40-80% of Max Heartrate 99-138   Ratings of Perceived Exertion 11-15   Perceived Dyspnea 0-4     Progression   Progression Continue to progress workloads to maintain intensity without signs/symptoms of physical distress.     Resistance Training   Training Prescription Yes   Weight 3lbs   Reps 10-12      Perform Capillary Blood Glucose checks as needed.  Exercise Prescription Changes:     Exercise Prescription Changes    Row Name 02/10/16 1500 02/19/16 1000 02/24/16 0900 03/04/16 1600 03/18/16 1600     Exercise Review   Progression -  walk test results Yes Yes Yes Yes     Response to Exercise   Blood Pressure (Admit) 142/70 128/64  - 130/68 106/64   Blood Pressure (Exercise) 144/70 130/74  - 130/64 132/74   Blood Pressure (Exit) 126/70 116/52  - 120/64 142/70   Heart Rate (Admit) 60 bpm 75 bpm  - 72 bpm 83 bpm   Heart Rate (Exercise) 85 bpm 115 bpm   -  106 bpm 120 bpm   Heart Rate (Exit) 66 bpm 73 bpm  - 68 bpm 83 bpm   Oxygen Saturation (Admit) 99 %  -  -  -  -   Oxygen Saturation (Exercise) 97 %  -  -  -  -   Rating of Perceived Exertion (Exercise) 12 12  - 12 13   Symptoms _0    Comments  -  - Home Exercise Guidelines given 02/24/16 Home Exercise Guidelines given 02/24/16 Home Exercise Guidelines given 02/24/16   Duration  - Progress to 45 minutes of aerobic exercise without signs/symptoms of physical distress Progress to 45 minutes of aerobic exercise without signs/symptoms of physical distress Progress to 45 minutes of aerobic exercise without signs/symptoms of physical distress Progress to 45 minutes of aerobic exercise without signs/symptoms of physical distress   Intensity  - THRR unchanged THRR unchanged THRR unchanged THRR unchanged     Progression   Progression  - Continue progressive overload as per policy without signs/symptoms or physical distress. Continue progressive overload as per policy without signs/symptoms or physical distress. Continue progressive overload as per policy without signs/symptoms or physical distress. Continue progressive overload as per policy without signs/symptoms or physical distress.   Average METs  - 2.85 2.85 3.15 3.25     Resistance Training   Training Prescription  - Yes Yes Yes Yes   Weight  - 3lbs 3lbs 3lbs 3lbs   Reps  - 10-12 10-12 10-12 10-12     Interval Training   Interval Training  - No No No No     Treadmill   MPH  - 2.5 2.5 2.5 2.5   Grade  - 1.5 1._1 Minutes  - _2 METs  - 3.26 3.26 3.26 3.26     REL-XR   Level  - _3 Minutes  - _4 METs  - 2.7 2.7 4.1 4.1     T5 Nustep   Level  - _5 Minutes  - _6 METs  - 2.6 2.6 2.1 2.4     Home Exercise Plan   Plans to continue exercise at  -  - Home  walking Home  walking Home  walking   Frequency  -  - Add 2 additional days to program exercise sessions. Add 2  additional days to program exercise sessions. Add 2 additional days to program exercise sessions.   [place] Name 04/01/16 1400             Exercise Review   Progression Yes         Response to Exercise   Blood Pressure (Admit) 128/70       Blood Pressure (Exercise) 122/64       Blood Pressure (Exit) 120/64       Heart Rate (Admit) 89 bpm       Heart Rate (Exercise) 119 bpm       Heart Rate (Exit) 77 bpm       Rating of Perceived Exertion (Exercise) 12       Symptoms none       Comments Home Exercise Guidelines given 02/24/16       Duration Progress to 45 minutes of aerobic exercise without signs/symptoms of physical distress       Intensity THRR unchanged         Progression  Progression Continue progressive overload as per policy without signs/symptoms or physical distress.       Average METs 3.45         Resistance Training   Training Prescription Yes       Weight 6 lbs       Reps 10-15         Interval Training   Interval Training No         Treadmill   MPH 2.5       Grade 1       Minutes 15       METs 3.26         REL-XR   Level 2       Minutes 15       METs 4.5         T5 Nustep   Level 4       Minutes 15       METs 2.6         Home Exercise Plan   Plans to continue exercise at Home  walking       Frequency Add 2 additional days to program exercise sessions.          Exercise Comments:     Exercise Comments    Row Name 02/10/16 1616 02/12/16 1049 02/19/16 1039 02/24/16 0921 03/04/16 1556   Exercise Comments Nikan wants to be more functional.  He wants to be able to help his family more with working around the house, yard work, and self care.  First full day of exercise!  Patient was oriented to gym and equipment including functions, settings, policies, and procedures.  Patient's individual exercise prescription and treatment plan were reviewed.  All starting workloads were established based on the results of the 6 minute walk test done at initial  orientation visit.  The plan for exercise progression was also introduced and progression will be customized based on patient's performance and goals. Bolton has had a good three full days of exercise.  He is already starting to make some progress. We will continue to track his progression. Reviewed home exercise with pt today.  Pt plans to walk at home for exercise.  Reviewed THR, pulse, RPE, sign and symptoms, and when to call 911 or MD.  Also discussed weather considerations and indoor options.  Pt voiced understanding. Jayd continues to do well in rehab.  He is up to 4.1 METs on the XR.  We will continue to monitor his progression.   [place] Name 03/18/16 1614 03/20/16 9198 04/01/16 1446 04/03/16 1002     Exercise Comments Timothey is doing well in rehab.  He is now attending 4-5 days a week to make sure he gets all of his sessions done quickly.  He is enjoying the exercise and likes coming daily.  We will continue to monitor his progression. Reviewed METs average and discussed progression with pt today. Dock will be graduating early on Friday!  He is concerned about his copays for the new year and wishes to go ahead and graduate now.  He improved his walk test by 42%!! Burt graduated today from cardiac rehab with 32/36sessions completed.  Details of the patient's exercise prescription and what He needs to do in order to continue the prescription and progress were discussed with patient.  Patient was given a copy of prescription and goals.  Patient verbalized understanding.  Timoty plans to continue to exercise by [employer] or [employer].  Discharge Exercise Prescription (Final Exercise Prescription Changes):     Exercise Prescription Changes - 04/01/16 1400      Exercise Review   Progression Yes     Response to Exercise   Blood Pressure (Admit) 128/70   Blood Pressure (Exercise) 122/64   Blood Pressure (Exit) 120/64   Heart Rate (Admit) 89 bpm   Heart Rate (Exercise) 119 bpm   Heart  Rate (Exit) 77 bpm   Rating of Perceived Exertion (Exercise) 12   Symptoms none   Comments Home Exercise Guidelines given 02/24/16   Duration Progress to 45 minutes of aerobic exercise without signs/symptoms of physical distress   Intensity THRR unchanged     Progression   Progression Continue progressive overload as per policy without signs/symptoms or physical distress.   Average METs 3.45     Resistance Training   Training Prescription Yes   Weight 6 lbs   Reps 10-15     Interval Training   Interval Training No     Treadmill   MPH 2.5   Grade 1   Minutes 15   METs 3.26     REL-XR   Level 2   Minutes 15   METs 4.5     T5 Nustep   Level 4   Minutes 15   METs 2.6     Home Exercise Plan   Plans to continue exercise at Home  walking   Frequency Add 2 additional days to program exercise sessions.      Nutrition:  Target Goals: Understanding of nutrition guidelines, daily intake of sodium <1583m, cholesterol <2041m calories 30% from fat and 7% or less from saturated fats, daily to have 5 or more servings of fruits and vegetables.  Biometrics:     Pre Biometrics - 02/10/16 1617      Pre Biometrics   Height _0  (1.727 m)   Weight 178 lb (80.7 kg)   Waist Circumference 37.5 inches   Hip Circumference 39 inches   Waist to Hip Ratio 0.96 %   BMI (Calculated) 27.1   Single Leg Stand 6.85 seconds         Post Biometrics - 04/01/16 0834       Post  Biometrics   Height _1  (1.727 m)   Weight 185 lb 14.4 oz (84.3 kg)   Waist Circumference 41 inches   Hip Circumference 41 inches   Waist to Hip Ratio 1 %   BMI (Calculated) 28.3   Single Leg Stand 6.15 seconds      Nutrition Therapy Plan and Nutrition Goals:     Nutrition Therapy & Goals - 04/03/16 0819      Personal Nutrition Goals   Personal Goal #1 Prepare meats by sauteeing in a small amount of oil, or oven-fry, bake, or grill.   Personal Goal #2 Include a protein source with each meal; try  nuts, peanut butter, lowfat cheese, or beans (except green beans), 1cup beans = 2oz protein.    Comments LAJonmichaelas added protein to meals or snacks by eating more peanut butter.  He is working on adding moire variety to his meals. His appetite has improved and he is gaining back some of the weight he loss. He went from 219 lbs to 171lbs.      Intervention Plan   Expected Outcomes Long Term Goal: Adherence to prescribed nutrition plan.      Nutrition Discharge: Rate Your Plate Scores:     Nutrition Assessments - 03/25/16 1411  Rate Your Plate Scores   Pre Score 63   Pre Score % 70 %   Post Score 65   Post Score % 72 %   % Change 2 %      Nutrition Goals Re-Evaluation:     Nutrition Goals Re-Evaluation    Row Name 03/11/16 0911             Personal Goal #1 Re-Evaluation   Personal Goal #1 Prepare meats by sauteeing in a small amount of oil, or oven-fry, bake, or grill.       Goal Progress Seen Yes       Comments Still eating out some, talked about cutting back.  He is ordering less fried food.           Personal Goal #2 Re-Evaluation   Personal Goal #2 Include a protein source with each meal; try nuts, peanut butter, lowfat cheese, or beans (except green beans), 1cup beans = 2oz protein.        Goal Progress Seen Yes       Comments He is eating more protein.  He still needs to add a protein to his lunch meal.         Intervention Plan   Intervention Continue to educate, counsel and set short/long term goals regarding individualized specific personal dietary modifications.       Comments Azaan is going to continue to work on his diet and adding in more protein options.          Psychosocial: Target Goals: Acknowledge presence or absence of depression, maximize coping skills, provide positive support system. Participant is able to verbalize types and ability to use techniques and skills needed for reducing stress and depression.  Initial Review & Psychosocial  Screening:     Initial Psych Review & Screening - 02/10/16 1235      Family Dynamics   Good Support System? Yes     Barriers   Psychosocial barriers to participate in program The patient should benefit from training in stress management and relaxation.     Screening Interventions   Interventions Encouraged to exercise      Quality of Life Scores:     Quality of Life - 03/25/16 1412      Quality of Life Scores   Health/Function Pre 18.96 %   Health/Function Post 20.14 %   Health/Function % Change 6.22 %   Socioeconomic Pre 26.28 %   Socioeconomic Post 30 %   Socioeconomic % Change  14.16 %   Psych/Spiritual Pre 24 %   Psych/Spiritual Post 24 %   Psych/Spiritual % Change 0 %   Family Pre 26.4 %   Family Post 25.2 %   Family % Change -4.55 %   GLOBAL Pre 22.81 %   GLOBAL Post 23.63 %   GLOBAL % Change 3.59 %      PHQ-9: Recent Review Flowsheet Data    Depression screen [facility] 2/9 03/25/2016 02/10/2016   Decreased Interest 0 1   Down, Depressed, Hopeless 0 0   PHQ - 2 Score 0 1   Altered sleeping 1 0   Tired, decreased energy 0 0   Change in appetite 0 1   Feeling bad or failure about yourself  0 1   Trouble concentrating 0 0   Moving slowly or fidgety/restless 0 0   Suicidal thoughts 0 0   PHQ-9 Score 1 3   Difficult doing work/chores Not difficult at all Not difficult at all  Psychosocial Evaluation and Intervention:     Psychosocial Evaluation - 02/12/16 0949      Psychosocial Evaluation & Interventions   Interventions Encouraged to exercise with the program and follow exercise prescription;Stress management education;Relaxation education   Comments Counselor met with Mr. Lemmons (Mr. Chauncey Cruel) today for initial psychosocial evaluation.  He is a 66 year old who had a CABGx4 in August and recently had his gall bladder removed as well.  He is a diabetic and on medications for this.  Mr. Chauncey Cruel has a strong support system with a spouse of 90 years; a daughter and son  who live close by and 4 grandchildren that are his pride and joy.  He reports his appetite is finally improving with the removal of his gall bladder - but he lost 40 pounds since since heart surgery in August as a result.  He is sleeping well and reports he is typically in a positive mood.  Mr. Chauncey Cruel does admit to a history of depression and anxiety with an overwhelmingly stressful job with unrealistic demands that he reports was "killing me."  Mr. Chauncey Cruel is on medications now for depression and anxiety and states it is maintaining his current symptoms.  He was a smoker and drinker to cope prior to his surgery; but he is learning new coping skills now with exercise and engaging in healthy hobbies.  He has goals to increase his stamina and strength while in this program.  Staff will continue to follow with him.  He will benefit from the psychoeducational components of this program - particularly stress management.        Psychosocial Re-Evaluation:     Psychosocial Re-Evaluation    Row Name 03/11/16 [phone] 03/18/16 0904 03/25/16 0920 04/03/16 0828       Psychosocial Re-Evaluation   Interventions Encouraged to attend Cardiac Rehabilitation for the exercise;Stress management education  -  -  -    Comments Khaden's mood has been great over the past few months since he has not been working.  His anxiety levels have been down too.  He has committed to not going back to work and will either retire or apply for long term disability.  He is getting out to do more too and sleeping well. Counselor follow up with Mr. Chauncey Cruel today reporting the holidays went well and although the scales indicated he enjoyed the food too much, he is confident he will get that weight back off quickly working out in this program.  Mr. Chauncey Cruel states he is able to raise his arms above his head now with all of the exercises he has been doing here.  He feels stronger and more energetic since coming into this program and stated he almost feels "back to normal."   His mood remains positive and he admits to coping with stress well - even over the holidays.   Counselor commended Mr. Chauncey Cruel on his hard work and commitment to exercise and accomplishing his goals.   Counselor follow up with Mr. Chauncey Cruel today stating he has continued to gain strength and stamina while in this program.  He has been stronger emotionally and is considering what is best for him in this New Year.  Counselor commended Mr. Chauncey Cruel for taking such good care of himself and making that his focus vs work and other distractions.   Garnet stated that his stress is less since he has decided to pursue disability. He is doing well with the exercise progression and staes he feels better.  Continued Psychosocial Services Needed  -  -  - No       Vocational Rehabilitation: Provide vocational rehab assistance to qualifying candidates.   Vocational Rehab Evaluation & Intervention:     Vocational Rehab - 02/10/16 1230      Initial Vocational Rehab Evaluation & Intervention   Assessment shows need for Vocational Rehabilitation No      Education: Education Goals: Education classes will be provided on a weekly basis, covering required topics. Participant will state understanding/return demonstration of topics presented.  Learning Barriers/Preferences:     Learning Barriers/Preferences - 02/10/16 1229      Learning Barriers/Preferences   Learning Barriers None   Learning Preferences None      Education Topics: General Nutrition Guidelines/Fats and Fiber: -Group instruction provided by verbal, written material, models and posters to present the general guidelines for heart healthy nutrition. Gives an explanation and review of dietary fats and fiber.   Controlling Sodium/Reading Food Labels: -Group verbal and written material supporting the discussion of sodium use in heart healthy nutrition. Review and explanation with models, verbal and written materials for utilization of the food  label.   Exercise Physiology & Risk Factors: - Group verbal and written instruction with models to review the exercise physiology of the cardiovascular system and associated critical values. Details cardiovascular disease risk factors and the goals associated with each risk factor.   Aerobic Exercise & Resistance Training: - Gives group verbal and written discussion on the health impact of inactivity. On the components of aerobic and resistive training programs and the benefits of this training and how to safely progress through these programs. Flowsheet Row Cardiac Rehab from 04/01/2016 in [facility] Cardiac and Pulmonary Rehab  Date  02/17/16  Educator  [facility]  Instruction Review Code  2- meets goals/outcomes      Flexibility, Balance, General Exercise Guidelines: - Provides group verbal and written instruction on the benefits of flexibility and balance training programs. Provides general exercise guidelines with specific guidelines to those with heart or lung disease. Demonstration and skill practice provided. Flowsheet Row Cardiac Rehab from 04/01/2016 in [facility] Cardiac and Pulmonary Rehab  Date  02/19/16  Educator  AS  Instruction Review Code  2- meets goals/outcomes      Stress Management: - Provides group verbal and written instruction about the health risks of elevated stress, cause of high stress, and healthy ways to reduce stress. Flowsheet Row Cardiac Rehab from 04/01/2016 in [facility] Cardiac and Pulmonary Rehab  Date  03/04/16  Educator  [facility]  Instruction Review Code  2- meets goals/outcomes      Depression: - Provides group verbal and written instruction on the correlation between heart/lung disease and depressed mood, treatment options, and the stigmas associated with seeking treatment. Flowsheet Row Cardiac Rehab from 04/01/2016 in [facility] Cardiac and Pulmonary Rehab  Date  04/01/16  Educator  [facility]  Instruction Review Code  2- meets goals/outcomes      Anatomy & Physiology of the  Heart: - Group verbal and written instruction and models provide basic cardiac anatomy and physiology, with the coronary electrical and arterial systems. Review of: AMI, Angina, Valve disease, Heart Failure, Cardiac Arrhythmia, Pacemakers, and the ICD. Flowsheet Row Cardiac Rehab from 04/01/2016 in [facility] Cardiac and Pulmonary Rehab  Date  02/24/16  Educator  TS  Instruction Review Code  2- meets goals/outcomes      Cardiac Procedures: - Group verbal and written instruction and models to describe the testing methods done to diagnose heart disease.  Reviews the outcomes of the test results. Describes the treatment choices: Medical Management, Angioplasty, or Coronary Bypass Surgery. Flowsheet Row Cardiac Rehab from 04/01/2016 in [facility] Cardiac and Pulmonary Rehab  Date  03/02/16  Educator  CE  Instruction Review Code  2- meets goals/outcomes      Cardiac Medications: - Group verbal and written instruction to review commonly prescribed medications for heart disease. Reviews the medication, class of the drug, and side effects. Includes the steps to properly store meds and maintain the prescription regimen. Flowsheet Row Cardiac Rehab from 04/01/2016 in [facility] Cardiac and Pulmonary Rehab  Date  03/11/16 Marisue Humble 2]  Educator  TS  Instruction Review Code  2- meets goals/outcomes      Go Sex-Intimacy & Heart Disease, Get SMART - Goal Setting: - Group verbal and written instruction through game format to discuss heart disease and the return to sexual intimacy. Provides group verbal and written material to discuss and apply goal setting through the application of the S.M.A.R.T. Method. Flowsheet Row Cardiac Rehab from 04/01/2016 in [facility] Cardiac and Pulmonary Rehab  Date  03/02/16  Educator  CE  Instruction Review Code  2- meets goals/outcomes      Other Matters of the Heart: - Provides group verbal, written materials and models to describe Heart Failure, Angina, Valve Disease, and Diabetes in the  realm of heart disease. Includes description of the disease process and treatment options available to the cardiac patient. Flowsheet Row Cardiac Rehab from 04/01/2016 in [facility] Cardiac and Pulmonary Rehab  Date  02/24/16  Educator  TS  Instruction Review Code  2- meets goals/outcomes      Exercise & Equipment Safety: - Individual verbal instruction and demonstration of equipment use and safety with use of the equipment. Flowsheet Row Cardiac Rehab from 04/01/2016 in [facility] Cardiac and Pulmonary Rehab  Date  02/10/16  Educator  C. EnterkinRN  Instruction Review Code  1- partially meets, needs review/practice      Infection Prevention: - Provides verbal and written material to individual with discussion of infection control including proper hand washing and proper equipment cleaning during exercise session. Flowsheet Row Cardiac Rehab from 04/01/2016 in [facility] Cardiac and Pulmonary Rehab  Date  02/10/16  Educator  C. Garfield  Instruction Review Code  1- partially meets, needs review/practice      Falls Prevention: - Provides verbal and written material to individual with discussion of falls prevention and safety. Flowsheet Row Cardiac Rehab from 04/01/2016 in [facility] Cardiac and Pulmonary Rehab  Date  02/10/16  Educator  C. Enterkin, RN  Instruction Review Code  1- partially meets, needs review/practice      Diabetes: - Individual verbal and written instruction to review signs/symptoms of diabetes, desired ranges of glucose level fasting, after meals and with exercise. Advice that pre and post exercise glucose checks will be done for 3 sessions at entry of program. [place] from 04/01/2016 in [facility] Cardiac and Pulmonary Rehab  Date  02/10/16  Educator  C. [place]  Instruction Review Code  1- partially meets, needs review/practice       Knowledge Questionnaire Score:     Knowledge Questionnaire Score - 03/25/16 1412      Knowledge Questionnaire Score   Pre  Score 22/28   Post Score 28/28      Core Components/Risk Factors/Patient Goals at Admission:     Personal Goals and Risk Factors at Admission - 02/10/16 1233      Core Components/Risk Factors/Patient Goals on Admission  Weight Management Yes;Weight Loss   Intervention Weight Management: Develop a combined nutrition and exercise program designed to reach desired caloric intake, while maintaining appropriate intake of nutrient and fiber, sodium and fats, and appropriate energy expenditure required for the weight goal.;Weight Management: Provide education and appropriate resources to help participant work on and attain dietary goals.   Admit Weight 178 lb (80.7 kg)   Goal Weight: Short Term 175 lb (79.4 kg)   Goal Weight: Long Term 168 lb (76.2 kg)   Expected Outcomes Short Term: Continue to assess and modify interventions until short term weight is achieved;Long Term: Adherence to nutrition and physical activity/exercise program aimed toward attainment of established weight goal;Weight Loss: Understanding of general recommendations for a balanced deficit meal plan, which promotes 1-2 lb weight loss per week and includes a negative energy balance of [phone] kcal/d;Understanding recommendations for meals to include 15-35% energy as protein, 25-35% energy from fat, 35-60% energy from carbohydrates, less than 242m of dietary cholesterol, 20-35 gm of total fiber daily;Understanding of distribution of calorie intake throughout the day with the consumption of 4-5 meals/snacks   Sedentary Yes   Intervention Provide advice, education, support and counseling about physical activity/exercise needs.;Develop an individualized exercise prescription for aerobic and resistive training based on initial evaluation findings, risk stratification, comorbidities and participant's personal goals.   Expected Outcomes Achievement of increased cardiorespiratory fitness and enhanced flexibility, muscular endurance and  strength shown through measurements of functional capacity and personal statement of participant.   Increase Strength and Stamina Yes   Intervention Provide advice, education, support and counseling about physical activity/exercise needs.;Develop an individualized exercise prescription for aerobic and resistive training based on initial evaluation findings, risk stratification, comorbidities and participant's personal goals.   Expected Outcomes Achievement of increased cardiorespiratory fitness and enhanced flexibility, muscular endurance and strength shown through measurements of functional capacity and personal statement of participant.   Diabetes Yes   Intervention Provide education about signs/symptoms and action to take for hypo/hyperglycemia.;Provide education about proper nutrition, including hydration, and aerobic/resistive exercise prescription along with prescribed medications to achieve blood glucose in normal ranges: Fasting glucose 65-99 mg/dL   Expected Outcomes Short Term: Participant verbalizes understanding of the signs/symptoms and immediate care of hyper/hypoglycemia, proper foot care and importance of medication, aerobic/resistive exercise and nutrition plan for blood glucose control.;Long Term: Attainment of HbA1C < 7%.   Hypertension Yes   Intervention Monitor prescription use compliance.;Provide education on lifestyle modifcations including regular physical activity/exercise, weight management, moderate sodium restriction and increased consumption of fresh fruit, vegetables, and low fat dairy, alcohol moderation, and smoking cessation.   Expected Outcomes Short Term: Continued assessment and intervention until BP is < 140/947mHG in hypertensive participants. < 130/8033mG in hypertensive participants with diabetes, heart failure or chronic kidney disease.;Long Term: Maintenance of blood pressure at goal levels.   Stress Yes   Intervention Offer individual and/or small group  education and counseling on adjustment to heart disease, stress management and health-related lifestyle change. Teach and support self-help strategies.;Refer participants experiencing significant psychosocial distress to appropriate mental health specialists for further evaluation and treatment. When possible, include family members and significant others in education/counseling sessions.   Expected Outcomes Short Term: Participant demonstrates changes in health-related behavior, relaxation and other stress management skills, ability to obtain effective social support, and compliance with psychotropic medications if prescribed.;Long Term: Emotional wellbeing is indicated by absence of clinically significant psychosocial distress or social isolation.      Core Components/Risk Factors/Patient Goals Review:  Goals and Risk Factor Review    Row Name 03/11/16 0913 04/03/16 1017           Core Components/Risk Factors/Patient Goals Review   Personal Goals Review Weight Management/Obesity;Sedentary;Increase Strength and Stamina;Lipids;Stress;Hypertension;Diabetes Weight Management/Obesity;Diabetes;Hypertension;Stress      Review Xzander has been doing well in rehab.  He is enjoying feeling better and being able to do more.  His stress levels have greatly reduced by not being at work.  He is getting out to work in the yard more.  However, he is not going out for a walk.Marland Kitchen  His weight has also been creeping up.  We talked about how watching his diet and exercising will help with weight loss.  His blood sugars and blood pressures have been good.  He had not had any problems with taking his medications. Sedrick saw his cardiologist this week. All is weel and he has plans to see PMD for all blood work soon. Am BS was 131. His weight is up some,because his appetite has returned. He is working on eating healthier food and having a variety of foods with his meals. His Rate Your PLate score showed improved nutrition  habits. Ahmari is working on applying for disability, his stress level has decreased significantly since making this decision. BP reading fluctuate, his cardiologist told him it was fine and to continue as he is with meds.       Expected Outcomes Devery will continue to come to education and exercise classes to work on weight loss and risk factor modifications. Treven will continue to work on risk factor reduction through his meds,exercise and nutrition changes.          Core Components/Risk Factors/Patient Goals at Discharge (Final Review):      Goals and Risk Factor Review - 04/03/16 0821      Core Components/Risk Factors/Patient Goals Review   Personal Goals Review Weight Management/Obesity;Diabetes;Hypertension;Stress   Review Karmello saw his cardiologist this week. All is weel and he has plans to see PMD for all blood work soon. Am BS was 131. His weight is up some,because his appetite has returned. He is working on eating healthier food and having a variety of foods with his meals. His Rate Your PLate score showed improved nutrition habits. Koehn is working on applying for disability, his stress level has decreased significantly since making this decision. BP reading fluctuate, his cardiologist told him it was fine and to continue as he is with meds.    Expected Outcomes Lealon will continue to work on risk factor reduction through his meds,exercise and nutrition changes.       ITP Comments:     ITP Comments    Row Name 02/10/16 1231 02/10/16 1235 02/26/16 0659 03/20/16 1137 03/24/16 0609   ITP Comments Alvis said he does not want to go back to his current job since it is very stressful. Padraig states that he has applied for disability and is already on long term disability.  ITP created during Medical Review. Documentation of diagnosis of CABG in discharge note of 11/20/2015.  Dacari said he quit smoking November 03, 2015 and has had no problems with that.  30 day review completed for review by Dr  Emily Filbert.  Continue with ITP unless changes noted by Dr Sabra Heck. I faxed to Dr. Donivan Scull office rhythm strips of today since when Larenzo's heart rate is in the 70s he has PVCs. No c/o. Blood pressure is stable.  30 day review. Continue with ITP unless  directed changes per Medical Director review.      Comments: Discharge ITP

## 2016-04-03 NOTE — Progress Notes (Signed)
Discharge Summary  Patient Details  Name: Brian Casey MRN: [id number] Date of Birth: 02-Jun-1950 Referring Provider:   Flowsheet Row Cardiac Rehab from 02/10/2016 in [facility] Cardiac and Pulmonary Rehab  Referring Provider  Ida Rogue MD       Number of Visits: 32/36  Reason for Discharge:  Patient reached a stable level of exercise. Patient independent in their exercise. Early Exit:  Insurance  Smoking History:  History  Smoking Status  . Former Smoker  . Packs/day: 0.12  . Years: 48.00  . Types: Cigarettes  Smokeless Tobacco  . Never Used    Comment: quit smoking 11/01/15    Diagnosis:  NSTEMI (non-ST elevated myocardial infarction) ([place])  S/P CABG x 4  ADL UCSD:   Initial Exercise Prescription:     Initial Exercise Prescription - 02/10/16 1500      Date of Initial Exercise RX and Referring Provider   Date 02/10/16   Referring Provider Ida Rogue MD     Treadmill   MPH 2   Grade 0.5   Minutes 15   METs 2.67     REL-XR   Level 2   Watts --  speed 50 rpm   Minutes 15   METs 2     T5 Nustep   Level 2   Watts --  80-100 spm   Minutes 15   METs 2     Prescription Details   Frequency (times per week) 3   Duration Progress to 45 minutes of aerobic exercise without signs/symptoms of physical distress     Intensity   THRR 40-80% of Max Heartrate 99-138   Ratings of Perceived Exertion 11-15   Perceived Dyspnea 0-4     Progression   Progression Continue to progress workloads to maintain intensity without signs/symptoms of physical distress.     Resistance Training   Training Prescription Yes   Weight 3lbs   Reps 10-12      Discharge Exercise Prescription (Final Exercise Prescription Changes):     Exercise Prescription Changes - 04/01/16 1400      Exercise Review   Progression Yes     Response to Exercise   Blood Pressure (Admit) 128/70   Blood Pressure (Exercise) 122/64   Blood Pressure (Exit) 120/64   Heart Rate (Admit)  89 bpm   Heart Rate (Exercise) 119 bpm   Heart Rate (Exit) 77 bpm   Rating of Perceived Exertion (Exercise) 12   Symptoms none   Comments Home Exercise Guidelines given 02/24/16   Duration Progress to 45 minutes of aerobic exercise without signs/symptoms of physical distress   Intensity THRR unchanged     Progression   Progression Continue progressive overload as per policy without signs/symptoms or physical distress.   Average METs 3.45     Resistance Training   Training Prescription Yes   Weight 6 lbs   Reps 10-15     Interval Training   Interval Training No     Treadmill   MPH 2.5   Grade 1   Minutes 15   METs 3.26     REL-XR   Level 2   Minutes 15   METs 4.5     T5 Nustep   Level 4   Minutes 15   METs 2.6     Home Exercise Plan   Plans to continue exercise at Home  walking   Frequency Add 2 additional days to program exercise sessions.      Functional Capacity:     6  Minute Walk    Row Name 02/10/16 1516 04/01/16 0835       6 Minute Walk   Phase Initial Discharge    Distance 1210 feet 1720 feet    Distance % Change  - 42 %    Walk Time 6 minutes 6 minutes    # of Rest Breaks 0 0    MPH 2.29 3.25    METS 3.1 4.33    RPE 12 17    VO2 Peak 10.86 15.15    Symptoms No No    Resting HR 60 bpm 89 bpm    Resting BP 142/70 128/70    Max Ex. HR 85 bpm 115 bpm    Max Ex. BP 144/70 152/64    2 Minute Post BP 126/70  -       Psychological, QOL, Others - Outcomes: PHQ 2/9: Depression screen [facility] 2/9 03/25/2016 02/10/2016  Decreased Interest 0 1  Down, Depressed, Hopeless 0 0  PHQ - 2 Score 0 1  Altered sleeping 1 0  Tired, decreased energy 0 0  Change in appetite 0 1  Feeling bad or failure about yourself  0 1  Trouble concentrating 0 0  Moving slowly or fidgety/restless 0 0  Suicidal thoughts 0 0  PHQ-9 Score 1 3  Difficult doing work/chores Not difficult at all Not difficult at all    Quality of Life:     Quality of Life - 03/25/16 1412       Quality of Life Scores   Health/Function Pre 18.96 %   Health/Function Post 20.14 %   Health/Function % Change 6.22 %   Socioeconomic Pre 26.28 %   Socioeconomic Post 30 %   Socioeconomic % Change  14.16 %   Psych/Spiritual Pre 24 %   Psych/Spiritual Post 24 %   Psych/Spiritual % Change 0 %   Family Pre 26.4 %   Family Post 25.2 %   Family % Change -4.55 %   GLOBAL Pre 22.81 %   GLOBAL Post 23.63 %   GLOBAL % Change 3.59 %      Personal Goals: Goals established at orientation with interventions provided to work toward goal.     Personal Goals and Risk Factors at Admission - 02/10/16 1233      Core Components/Risk Factors/Patient Goals on Admission    Weight Management Yes;Weight Loss   Intervention Weight Management: Develop a combined nutrition and exercise program designed to reach desired caloric intake, while maintaining appropriate intake of nutrient and fiber, sodium and fats, and appropriate energy expenditure required for the weight goal.;Weight Management: Provide education and appropriate resources to help participant work on and attain dietary goals.   Admit Weight 178 lb (80.7 kg)   Goal Weight: Short Term 175 lb (79.4 kg)   Goal Weight: Long Term 168 lb (76.2 kg)   Expected Outcomes Short Term: Continue to assess and modify interventions until short term weight is achieved;Long Term: Adherence to nutrition and physical activity/exercise program aimed toward attainment of established weight goal;Weight Loss: Understanding of general recommendations for a balanced deficit meal plan, which promotes 1-2 lb weight loss per week and includes a negative energy balance of [phone] kcal/d;Understanding recommendations for meals to include 15-35% energy as protein, 25-35% energy from fat, 35-60% energy from carbohydrates, less than 284m of dietary cholesterol, 20-35 gm of total fiber daily;Understanding of distribution of calorie intake throughout the day with the consumption  of 4-5 meals/snacks   Sedentary Yes   Intervention Provide  advice, education, support and counseling about physical activity/exercise needs.;Develop an individualized exercise prescription for aerobic and resistive training based on initial evaluation findings, risk stratification, comorbidities and participant's personal goals.   Expected Outcomes Achievement of increased cardiorespiratory fitness and enhanced flexibility, muscular endurance and strength shown through measurements of functional capacity and personal statement of participant.   Increase Strength and Stamina Yes   Intervention Provide advice, education, support and counseling about physical activity/exercise needs.;Develop an individualized exercise prescription for aerobic and resistive training based on initial evaluation findings, risk stratification, comorbidities and participant's personal goals.   Expected Outcomes Achievement of increased cardiorespiratory fitness and enhanced flexibility, muscular endurance and strength shown through measurements of functional capacity and personal statement of participant.   Diabetes Yes   Intervention Provide education about signs/symptoms and action to take for hypo/hyperglycemia.;Provide education about proper nutrition, including hydration, and aerobic/resistive exercise prescription along with prescribed medications to achieve blood glucose in normal ranges: Fasting glucose 65-99 mg/dL   Expected Outcomes Short Term: Participant verbalizes understanding of the signs/symptoms and immediate care of hyper/hypoglycemia, proper foot care and importance of medication, aerobic/resistive exercise and nutrition plan for blood glucose control.;Long Term: Attainment of HbA1C < 7%.   Hypertension Yes   Intervention Monitor prescription use compliance.;Provide education on lifestyle modifcations including regular physical activity/exercise, weight management, moderate sodium restriction and increased  consumption of fresh fruit, vegetables, and low fat dairy, alcohol moderation, and smoking cessation.   Expected Outcomes Short Term: Continued assessment and intervention until BP is < 140/71m HG in hypertensive participants. < 130/826mHG in hypertensive participants with diabetes, heart failure or chronic kidney disease.;Long Term: Maintenance of blood pressure at goal levels.   Stress Yes   Intervention Offer individual and/or small group education and counseling on adjustment to heart disease, stress management and health-related lifestyle change. Teach and support self-help strategies.;Refer participants experiencing significant psychosocial distress to appropriate mental health specialists for further evaluation and treatment. When possible, include family members and significant others in education/counseling sessions.   Expected Outcomes Short Term: Participant demonstrates changes in health-related behavior, relaxation and other stress management skills, ability to obtain effective social support, and compliance with psychotropic medications if prescribed.;Long Term: Emotional wellbeing is indicated by absence of clinically significant psychosocial distress or social isolation.       Personal Goals Discharge:     Goals and Risk Factor Review    Row Name 03/11/16 0913 04/03/16 [id number]         Core Components/Risk Factors/Patient Goals Review   Personal Goals Review Weight Management/Obesity;Sedentary;Increase Strength and Stamina;Lipids;Stress;Hypertension;Diabetes Weight Management/Obesity;Diabetes;Hypertension;Stress      Review Brian Casey been doing well in rehab.  He is enjoying feeling better and being able to do more.  His stress levels have greatly reduced by not being at work.  He is getting out to work in the yard more.  However, he is not going out for a walk.. Brian Casey KitchenHis weight has also been creeping up.  We talked about how watching his diet and exercising will help with weight loss.   His blood sugars and blood pressures have been good.  He had not had any problems with taking his medications. Brian Casey his cardiologist this week. All is weel and he has plans to see PMD for all blood work soon. Am BS was 131. His weight is up some,because his appetite has returned. He is working on eating healthier food and having a variety of foods with his meals. His Rate  Your PLate score showed improved nutrition habits. Brian Casey is working on applying for disability, his stress level has decreased significantly since making this decision. BP reading fluctuate, his cardiologist told him it was fine and to continue as he is with meds.       Expected Outcomes Brian Casey will continue to come to education and exercise classes to work on weight loss and risk factor modifications. Brian Casey will continue to work on risk factor reduction through his meds,exercise and nutrition changes.          Nutrition & Weight - Outcomes:     Pre Biometrics - 02/10/16 1617      Pre Biometrics   Height 5' 8" (1.727 m)   Weight 178 lb (80.7 kg)   Waist Circumference 37.5 inches   Hip Circumference 39 inches   Waist to Hip Ratio 0.96 %   BMI (Calculated) 27.1   Single Leg Stand 6.85 seconds         Post Biometrics - 04/01/16 0834       Post  Biometrics   Height 5' 8" (1.727 m)   Weight 185 lb 14.4 oz (84.3 kg)   Waist Circumference 41 inches   Hip Circumference 41 inches   Waist to Hip Ratio 1 %   BMI (Calculated) 28.3   Single Leg Stand 6.15 seconds      Nutrition:     Nutrition Therapy & Goals - 04/03/16 0819      Personal Nutrition Goals   Personal Goal #1 Prepare meats by sauteeing in a small amount of oil, or oven-fry, bake, or grill.   Personal Goal #2 Include a protein source with each meal; try nuts, peanut butter, lowfat cheese, or beans (except green beans), 1cup beans = 2oz protein.    Comments Brian Casey has added protein to meals or snacks by eating more peanut butter.  He is working on adding  moire variety to his meals. His appetite has improved and he is gaining back some of the weight he loss. He went from 219 lbs to 171lbs.      Intervention Plan   Expected Outcomes Long Term Goal: Adherence to prescribed nutrition plan.      Nutrition Discharge:     Nutrition Assessments - 03/25/16 1411      Rate Your Plate Scores   Pre Score 63   Pre Score % 70 %   Post Score 65   Post Score % 72 %   % Change 2 %      Education Questionnaire Score:     Knowledge Questionnaire Score - 03/25/16 1412      Knowledge Questionnaire Score   Pre Score 22/28   Post Score 28/28      Goals reviewed with patient; copy given to patient.

## 2016-04-03 NOTE — Progress Notes (Signed)
Daily Session Note  Patient Details  Name: Brian Casey MRN: [id number] Date of Birth: 1950/08/09 Referring Provider:   Flowsheet Row Cardiac Rehab from 02/10/2016 in [facility]  Referring Provider  Brian Rogue MD      Encounter Date: 04/03/2016  Check In:     Session Check In - 04/03/16 0855      Check-In   Staff Present Brian Lark, RN, BSN, CCRP;Carroll Enterkin, RN, Brian Heritage, MA, ACSM RCEP, Exercise Physiologist   Supervising physician immediately available to respond to emergencies See telemetry face sheet for immediately available ER MD   Medication changes reported     No   Fall or balance concerns reported    No   Warm-up and Cool-down Performed on first and last piece of equipment   Resistance Training Performed Yes   VAD Patient? No     Pain Assessment   Currently in Pain? No/denies         Goals Met:  Exercise tolerated well Personal goals reviewed No report of cardiac concerns or symptoms Strength training completed today  Goals Unmet:  Not Applicable  Comments: . [name] graduated today from cardiac rehab with 32/36sessions completed.  Details of the patient's exercise prescription and what He needs to do in order to continue the prescription and progress were discussed with patient.  Patient was given a copy of prescription and goals.  Patient verbalized understanding.  Brian Casey plans to continue to exercise by [employer] or [employer].    Dr. Emily Filbert is Medical Director for [facility].

## 2016-04-03 NOTE — Telephone Encounter (Signed)
Spoke w/ pt.  Advised him of Dr. Donivan Scull recommendation.  He verbalizes understanding and will p/u samples today or Monday.

## 2016-04-03 NOTE — Telephone Encounter (Signed)
-----  Message from Minna Merritts, MD sent at 04/02/2016  7:08 PM EST ----- Would recommend he stop the aspirin, start eliquis 5 twice a day for paroxysmal atrial fibrillation Come in for samples, coupon

## 2016-04-10 ENCOUNTER — Telehealth: Payer: Self-pay

## 2016-04-10 NOTE — Telephone Encounter (Signed)
Pt states he will be out of Eliquis after tomorrow and requests more samples until he hears from Patient Assistance.  States he may qualify to receive it for $10/month otherwise, he will have to pay $80/month. Samples of this drug were given to the patient, quantity 2 boxes, Lot Number [id number] exp: 05/2018  He will be by our office in 15 minutes

## 2016-04-10 NOTE — Telephone Encounter (Signed)
Pt would like Eliquis samples.

## 2016-04-14 ENCOUNTER — Telehealth: Payer: Self-pay | Admitting: Cardiovascular Disease

## 2016-04-14 NOTE — Telephone Encounter (Signed)
Will forward to Cardiology. Not a pulmonary patient.

## 2016-04-14 NOTE — Telephone Encounter (Signed)
Patient is checking on his handicap parking paperwork. Please call patient. Dropped it off last week.

## 2016-04-15 ENCOUNTER — Encounter: Payer: Self-pay | Admitting: Cardiovascular Disease

## 2016-04-15 ENCOUNTER — Telehealth: Payer: Self-pay | Admitting: Cardiovascular Disease

## 2016-04-15 NOTE — Telephone Encounter (Signed)
Spoke w/ pt.  Advised him that I am mailing completed handicap parking application to his home. I am leaving 4 boxes of Eliquis samples at the front desk for him to p/u at his convenience. Pt is appreciative of the call.

## 2016-04-22 ENCOUNTER — Telehealth: Payer: Self-pay | Admitting: Cardiovascular Disease

## 2016-04-22 NOTE — Telephone Encounter (Signed)
Called patient re: mychart appointment request.  Patient wants 4/17 or 4/18 in the morning per msg.  Dr. Rockey Situ is not in the office these days.  Offered 07/09/16 .  Also called patient and lmov.

## 2016-05-04 ENCOUNTER — Telehealth: Payer: Self-pay | Admitting: Cardiovascular Disease

## 2016-05-04 NOTE — Telephone Encounter (Signed)
Received records request from [place] , forwarded to [employer] [facility] for processing.

## 2016-05-08 ENCOUNTER — Telehealth: Payer: Self-pay | Admitting: Cardiovascular Disease

## 2016-05-08 NOTE — Telephone Encounter (Signed)
Received records request [employer], forwarded to [facility] for processing.

## 2016-05-12 ENCOUNTER — Telehealth: Payer: Self-pay | Admitting: Cardiovascular Disease

## 2016-05-12 NOTE — Telephone Encounter (Signed)
Routed to fax # provided. 

## 2016-05-12 NOTE — Telephone Encounter (Signed)
Received fax from the [employer] requesting permission for pt to participate in the [place].  Please route recommendation to [phone].

## 2016-05-12 NOTE — Telephone Encounter (Signed)
Acceptable risk to start forever fit program

## 2016-05-14 ENCOUNTER — Telehealth: Payer: Self-pay | Admitting: Cardiovascular Disease

## 2016-05-14 NOTE — Telephone Encounter (Signed)
Received records request Disability Determination Services, forwarded to [employer] for processing.  

## 2016-05-15 ENCOUNTER — Telehealth: Payer: Self-pay | Admitting: Cardiovascular Disease

## 2016-05-15 ENCOUNTER — Encounter: Payer: Self-pay | Admitting: Cardiovascular Disease

## 2016-05-15 NOTE — Telephone Encounter (Signed)
Called patient in reference to his mychart message for records. He states that he received a letter from [employer] that he needed to pay for something and he just doesn't have the money for that. He states that he received a call from them this morning stating that the paperwork was being sent to [place] for processing. Let him know that we do not have the paperwork at this time. Let him know that he could try calling their number to check on the status but as of right now we don't have it and it may be in transit to [place]. He simply stated that he could not pay anything for this and that we should send request for this to whoever is requesting these records. Let him know that once we receive the paperwork and complete it we then send it back to them to process. Advised him to try calling them regarding the bill since we do not have access to this information. He states that they already called and told him we would get the paperwork. Let him know that if we needed anything then someone would be in touch.

## 2016-05-17 ENCOUNTER — Other Ambulatory Visit: Payer: Self-pay | Admitting: Cardiovascular Disease

## 2016-05-18 ENCOUNTER — Encounter: Payer: Self-pay | Admitting: Cardiovascular Disease

## 2016-05-19 ENCOUNTER — Encounter: Payer: Self-pay | Admitting: Cardiovascular Disease

## 2016-05-21 NOTE — Telephone Encounter (Signed)
Patient forms returned from [employer]. Placed in Nurse box.

## 2016-05-22 DIAGNOSIS — Z736 Limitation of activities due to disability: Secondary | ICD-10-CM

## 2016-05-22 NOTE — Telephone Encounter (Signed)
We have received the original paperwork from [employer]. They are in Dr. Donivan Scull inbox in the blue folder to be completed. Advised pt via MyChart that I will notify him once this has been taken care of.

## 2016-05-24 NOTE — Telephone Encounter (Signed)
I was oncall and very busy in the hospital, I will try to get it done this week

## 2016-05-27 NOTE — Telephone Encounter (Signed)
Patient calling back to check status of form completion.  Please call.

## 2016-05-27 NOTE — Telephone Encounter (Signed)
Sent mychart message to patient letting him know that we are waiting on forms to be completed by Dr. Rockey Situ and that we will call him when they are completed.

## 2016-05-29 NOTE — Telephone Encounter (Signed)
Pt calling to check status of paperwork.  He states that he will call daily until it is done.

## 2016-06-03 ENCOUNTER — Encounter: Payer: Self-pay | Admitting: Cardiovascular Disease

## 2016-06-16 ENCOUNTER — Encounter: Payer: Self-pay | Admitting: Family

## 2016-06-26 ENCOUNTER — Encounter: Payer: Self-pay | Admitting: Family

## 2016-06-26 ENCOUNTER — Ambulatory Visit (INDEPENDENT_AMBULATORY_CARE_PROVIDER_SITE_OTHER): Payer: BLUE CROSS/BLUE SHIELD | Admitting: Family

## 2016-06-26 ENCOUNTER — Ambulatory Visit (HOSPITAL_COMMUNITY)
Admission: RE | Admit: 2016-06-26 | Discharge: 2016-06-26 | Disposition: A | Discharge status: Home / Self Care | Payer: Non-veteran care | Source: Ambulatory Visit | Attending: Family | Admitting: Family

## 2016-06-26 VITALS — BP 155/80 | HR 62 | Temp 97.0°F | Resp 20 | Ht 67.0 in | Wt 200.0 lb

## 2016-06-26 DIAGNOSIS — Z9889 Other specified postprocedural states: Secondary | ICD-10-CM

## 2016-06-26 DIAGNOSIS — I6523 Occlusion and stenosis of bilateral carotid arteries: Secondary | ICD-10-CM | POA: Diagnosis not present

## 2016-06-26 DIAGNOSIS — I6522 Occlusion and stenosis of left carotid artery: Secondary | ICD-10-CM | POA: Diagnosis present

## 2016-06-26 LAB — VAS US CAROTID
LCCADSYS: 83 cm/s
LCCAPDIAS: 17 cm/s
LCCAPSYS: 74 cm/s
LEFT ECA DIAS: -16 cm/s
LICADDIAS: -24 cm/s
LICAPDIAS: -26 cm/s
Left CCA dist dias: 21 cm/s
Left ICA dist sys: -67 cm/s
Left ICA prox sys: -86 cm/s
RCCADSYS: -109 cm/s
RIGHT CCA MID DIAS: -18 cm/s
RIGHT ECA DIAS: -15 cm/s
Right CCA prox dias: 14 cm/s
Right CCA prox sys: 57 cm/s

## 2016-06-26 NOTE — Patient Instructions (Signed)
Stroke Prevention Some medical conditions and behaviors are associated with an increased chance of having a stroke. You may prevent a stroke by making healthy choices and managing medical conditions. How can I reduce my risk of having a stroke?  Stay physically active. Get at least 30 minutes of activity on most or all days.  Do not smoke. It may also be helpful to avoid exposure to secondhand smoke.  Limit alcohol use. Moderate alcohol use is considered to be:  No more than 2 drinks per day for men.  No more than 1 drink per day for nonpregnant women.  Eat healthy foods. This involves:  Eating 5 or more servings of fruits and vegetables a day.  Making dietary changes that address high blood pressure (hypertension), high cholesterol, diabetes, or obesity.  Manage your cholesterol levels.  Making food choices that are high in fiber and low in saturated fat, trans fat, and cholesterol may control cholesterol levels.  Take any prescribed medicines to control cholesterol as directed by your health care provider.  Manage your diabetes.  Controlling your carbohydrate and sugar intake is recommended to manage diabetes.  Take any prescribed medicines to control diabetes as directed by your health care provider.  Control your hypertension.  Making food choices that are low in salt (sodium), saturated fat, trans fat, and cholesterol is recommended to manage hypertension.  Ask your health care provider if you need treatment to lower your blood pressure. Take any prescribed medicines to control hypertension as directed by your health care provider.  If you are 18-39 years of age, have your blood pressure checked every 3-5 years. If you are 40 years of age or older, have your blood pressure checked every year.  Maintain a healthy weight.  Reducing calorie intake and making food choices that are low in sodium, saturated fat, trans fat, and cholesterol are recommended to manage  weight.  Stop drug abuse.  Avoid taking birth control pills.  Talk to your health care provider about the risks of taking birth control pills if you are over 35 years old, smoke, get migraines, or have ever had a blood clot.  Get evaluated for sleep disorders (sleep apnea).  Talk to your health care provider about getting a sleep evaluation if you snore a lot or have excessive sleepiness.  Take medicines only as directed by your health care provider.  For some people, aspirin or blood thinners (anticoagulants) are helpful in reducing the risk of forming abnormal blood clots that can lead to stroke. If you have the irregular heart rhythm of atrial fibrillation, you should be on a blood thinner unless there is a good reason you cannot take them.  Understand all your medicine instructions.  Make sure that other conditions (such as anemia or atherosclerosis) are addressed. Get help right away if:  You have sudden weakness or numbness of the face, arm, or leg, especially on one side of the body.  Your face or eyelid droops to one side.  You have sudden confusion.  You have trouble speaking (aphasia) or understanding.  You have sudden trouble seeing in one or both eyes.  You have sudden trouble walking.  You have dizziness.  You have a loss of balance or coordination.  You have a sudden, severe headache with no known cause.  You have new chest pain or an irregular heartbeat. Any of these symptoms may represent a serious problem that is an emergency. Do not wait to see if the symptoms will go away.   Get medical help at once. Call your local emergency services (911 in [place] Do not drive yourself to the hospital. This information is not intended to replace advice given to you by your health care provider. Make sure you discuss any questions you have with your health care provider. Document Released: 04/16/2004 Document Revised: 08/15/2015 Document Reviewed: 09/09/2012 Elsevier  Interactive Patient Education  2017 [place].     Preventing Cerebrovascular Disease Arteries are blood vessels that carry blood that contains oxygen from the heart to all parts of the body. Cerebrovascular disease affects arteries that supply the brain. Any condition that blocks or disrupts blood flow to the brain can cause cerebrovascular disease. Brain cells that lose blood supply start to die within minutes (stroke). Stroke is the main danger of cerebrovascular disease. Atherosclerosis and high blood pressure are common causes of cerebrovascular disease. Atherosclerosis is narrowing and hardening of an artery that results when fat, cholesterol, calcium, or other substances (plaque) build up inside an artery. Plaque reduces blood flow through the artery. High blood pressure increases the risk of bleeding inside the brain. Making diet and lifestyle changes to prevent atherosclerosis and high blood pressure lowers your risk of cerebrovascular disease. What nutrition changes can be made?  Eat more fruits, vegetables, and whole grains.  Reduce how much saturated fat you eat. To do this, eat less red meat and fewer full-fat dairy products.  Eat healthy proteins instead of red meat. Healthy proteins include:  Fish. Eat fish that contains heart-healthy omega-3 fatty acids, twice a week. Examples include salmon, albacore tuna, mackerel, and herring.  Chicken.  Nuts.  Low-fat or nonfat yogurt.  Avoid processed meats, like bacon and lunchmeat.  Avoid foods that contain:  A lot of sugar, such as sweets and drinks with added sugar.  A lot of salt (sodium). Avoid adding extra salt to your food, as told by your health care provider.  Trans fats, such as margarine and baked goods. Trans fats may be listed as "partially hydrogenated oils" on food labels.  Check food labels to see how much sodium, sugar, and trans fats are in foods.  Use vegetable oils that contain low amounts of  saturated fat, such as olive oil or canola oil. What lifestyle changes can be made?  Drink alcohol in moderation. This means no more than 1 drink a day for nonpregnant women and 2 drinks a day for men. One drink equals 12 oz of beer, 5 oz of wine, or 1 oz of hard liquor.  If you are overweight, ask your health care provider to recommend a weight-loss plan for you. Losing 5-10 lb (2.2-4.5 kg) can reduce your risk of diabetes, atherosclerosis, and high blood pressure.  Exercise for 30?60 minutes on most days, or as much as told by your health care provider.  Do moderate-intensity exercise, such as brisk walking, bicycling, and water aerobics. Ask your health care provider which activities are safe for you.  Do not use any products that contain nicotine or tobacco, such as cigarettes and e-cigarettes. If you need help quitting, ask your health care provider. Why are these changes important? Making these changes lowers your risk of many diseases that can cause cerebrovascular disease and stroke. Stroke is a leading cause of death and disability. Making these changes also improves your overall health and quality of life. What can I do to lower my risk? The following factors make you more likely to develop cerebrovascular disease:  Being overweight.  Smoking.  Being physically inactive.  Eating a high-fat diet.  Having certain health conditions, such as:  Diabetes.  High blood pressure.  Heart disease.  Atherosclerosis.  High cholesterol.  Sickle cell disease. Talk with your health care provider about your risk for cerebrovascular disease. Work with your health care provider to control diseases that you have that may contribute to cerebrovascular disease. Your health care provider may prescribe medicines to help prevent major causes of cerebrovascular disease. Where to find more information: Learn more about preventing cerebrovascular disease from:  [facility], and  [place]: MoAnalyst.de  Centers for Disease Control and Prevention: http://www.curry-wood.biz/ Summary  Cerebrovascular disease can lead to a stroke.  Atherosclerosis and high blood pressure are major causes of cerebrovascular disease.  Making diet and lifestyle changes can reduce your risk of cerebrovascular disease.  Work with your health care provider to get your risk factors under control to reduce your risk of cerebrovascular disease. This information is not intended to replace advice given to you by your health care provider. Make sure you discuss any questions you have with your health care provider. Document Released: 03/24/2015 Document Revised: 09/27/2015 Document Reviewed: 03/24/2015 Elsevier Interactive Patient Education  2017 [employer].

## 2016-06-26 NOTE — Progress Notes (Signed)
Chief Complaint: Follow up Extracranial Carotid Artery Stenosis   History of Present Illness  Brian Casey is a 66 y.o. male who is s/p left carotid endarterectomy by Dr. Donzetta Casey for asymptomatic disease along with a CABG x 4 vessels by Dr. Nils Casey on 11-14-15.  Dr. Donzetta Casey last evaluated pt on 12-20-15. At that time pt was progressing well from carotid endarterectomy standpoint. He was to follow up in 6 months with repeat carotid duplex. He is here today for this.   He denies any known history of stroke or TIA. Specifically he denies a history of amaurosis fugax or monocular blindness, unilateral facial drooping, hemiplegia, or receptive or expressive aphasia.   He had a biliary drain placed to address his GB problems, but the GB surgery had to be on hold until he had the CABG. He then developed sepsis from this drain, was treated for that which resolved.  He completed cardiac rehab in April 2018.   He is undergoing disability eligibility. He states his job was killing him, a job with no ethics, treated him and others very badly.   Pt Diabetic: yes, 7.1 A1C August 2017 (review of records)  Pt smoker: former smoker, quit August 2017, started about age 89 years  Pt meds include: Statin : yes ASA: no Other anticoagulants/antiplatelets: Eliquis prescribed by Dr. Rockey Situ; pt states he has a hx of atrial fib    Past Medical History:  Diagnosis Date  . Acute renal failure ([place]) 11/06/2015   Brian Casey 11/06/2015  . Anxiety   . Arthritis    "lower spine; fingers" (11/07/2015)  . Depression   . GERD (gastroesophageal reflux disease)   . High cholesterol   . History of bronchitis   . Hypertension 10/17/2013  . Hyperthyroidism    "had it radiated in his '10s"  . Hypothyroidism   . New onset atrial fibrillation ([place]) 11/06/2015   Brian Casey 11/07/2015  . NSTEMI (non-ST elevated myocardial infarction) ([place]) 11/07/2015  . Thyroid disease   . Type II diabetes mellitus ([place])    type II  . Wears  glasses     Social History Social History  Substance Use Topics  . Smoking status: Former Smoker    Packs/day: 0.12    Years: 48.00    Types: Cigarettes  . Smokeless tobacco: Never Used     Comment: quit smoking 11/01/15  . Alcohol use 34.2 oz/week    36 Cans of beer, 21 Shots of liquor per week     Comment: "used to drink significantly, but now only drinks 1 beer occasionally"     Family History Family History  Problem Relation Age of Onset  . Cancer Mother     colon  . Hypertension Mother   . Diabetes Mother   . Depression Mother   . Colon cancer Mother   . Hyperthyroidism Father   . Hypertension Father   . Cancer Father     prostate  . Diabetes Maternal Grandmother   . Cancer Maternal Grandmother     lung, non smoker    Surgical History Past Surgical History:  Procedure Laterality Date  . CARDIAC CATHETERIZATION Right 11/06/2015   Procedure: Left Heart Cath and Coronary Angiography;  Surgeon: Brian Merritts, Brian Casey;  Location: [facility];  Service: Cardiovascular;  Laterality: Right;  . CHOLECYSTECTOMY N/A 01/17/2016   Procedure: LAPAROSCOPIC CHOLECYSTECTOMY WITH INTRAOPERATIVE CHOLANGIOGRAM;  Surgeon: Brian Skates, Brian Casey;  Location: [place];  Service: General;  Laterality: N/A;  . COLONOSCOPY    .  CORONARY ARTERY BYPASS GRAFT N/A 11/14/2015   Procedure: CORONARY ARTERY BYPASS GRAFTING (CABG) x 4;  Surgeon: Brian Poot, Brian Casey;  Location: [place];  Service: Open Heart Surgery;  Laterality: N/A;  . ENDARTERECTOMY Left 11/14/2015   Procedure: LEFT ENDARTERECTOMY CAROTID;  Surgeon: Brian Sandy, Brian Casey;  Location: [facility] [place];  Service: Vascular;  Laterality: Left;  . FINGER GANGLION CYST EXCISION Left X 3   "index finger X 2; thumb X 1"  . [place]  . HERNIA REPAIR    . INSERTION OF MESH N/A 11/16/2013   Procedure: INSERTION OF MESH;  Surgeon: Brian Burn. Georgette Dover, Brian Casey;  Location: [facility];  Service: General;  Laterality: N/A;  . IR GENERIC HISTORICAL   11/08/2015   IR PERC CHOLECYSTOSTOMY 11/08/2015 Brian Mckusick, Brian Casey [facility]  . IR GENERIC HISTORICAL  12/13/2015   IR CHOLANGIOGRAM EXISTING TUBE 12/13/2015 [facility]-INTERV RAD  . IR GENERIC HISTORICAL  12/25/2015   IR CATHETER TUBE CHANGE 12/25/2015 Brian Mariscal, Brian Casey [facility]  . IR GENERIC HISTORICAL  12/12/2015   IR RADIOLOGIST EVAL & MGMT 12/12/2015 Brian Keen, Brian Casey [facility]  . TEE WITHOUT CARDIOVERSION N/A 11/14/2015   Procedure: TRANSESOPHAGEAL ECHOCARDIOGRAM (TEE);  Surgeon: Brian Poot, Brian Casey;  Location: [facility];  Service: Open Heart Surgery;  Laterality: N/A;  . UMBILICAL HERNIA REPAIR N/A 11/16/2013   Procedure: UMBILICAL HERNIA REPAIR;  Surgeon: Brian Burn. Georgette Dover, Brian Casey;  Location: [facility];  Service: General;  Laterality: N/A;  . VEIN HARVEST Right 11/14/2015   Procedure: RIGHT LEG GREATER SAPHENOUS VEIN HARVEST;  Surgeon: Brian Poot, Brian Casey;  Location: [place];  Service: Open Heart Surgery;  Laterality: Right;    Allergies  Allergen Reactions  . Penicillins Nausea And Vomiting and Other (See Comments)    REACTION: Vomiting Has patient had a PCN reaction causing immediate rash, facial/tongue/throat swelling, SOB or lightheadedness with hypotension: Yes Has patient had a PCN reaction causing severe rash involving mucus membranes or skin necrosis: No Has patient had a PCN reaction that required hospitalization No Has patient had a PCN reaction occurring within the last 10 years: No If all of the above answers are "NO", then may proceed with Cephalosporin use.  . Bupropion Hcl     REACTION: Severe constipation, insomnia    Current Outpatient Prescriptions  Medication Sig Dispense Refill  . apixaban (ELIQUIS) 5 MG TABS tablet Take 1 tablet (5 mg total) by mouth 2 (two) times daily. 60 tablet 6  . atorvastatin (LIPITOR) 40 MG tablet Take 40 mg by mouth daily.    Brian Casey Kitchen escitalopram (LEXAPRO) 10 MG tablet Take 1 tablet (10 mg total) by mouth daily. 30 tablet 0  . levothyroxine (SYNTHROID,  LEVOTHROID) 100 MCG tablet Take 1 tablet (100 mcg total) by mouth daily before breakfast. OFFICE VISIT WITH  LABS REQUIRED FOR ADDITIONAL REFILLS 30 tablet 0  . metFORMIN (GLUCOPHAGE) 500 MG tablet Take 500 mg by mouth 2 (two) times daily with a meal.    . metoprolol tartrate (LOPRESSOR) 25 MG tablet TAKE 1/2 OF A TABLET (12.5 MG TOTAL) BY MOUTH 2 (TWO) TIMES DAILY. 90 tablet 0  . omeprazole (PRILOSEC) 20 MG capsule Take 1 capsule (20 mg total) by mouth daily. OFFICE VISIT REQUIRED FOR ADDITIONAL REFILLS 30 capsule 0   No current facility-administered medications for this visit.     Review of Systems : See HPI for pertinent positives and negatives.  Physical Examination  Vitals:   06/26/16 1139 06/26/16 1142 06/26/16 1145  BP: (!) 150/86 (!) 166/85 (!) 155/80  Pulse: 62 60 62  Resp: 20    Temp: 97 F (36.1 C)    TempSrc: Oral    SpO2: 98%    Weight: 200 lb (90.7 kg)    Height: _0  (1.702 m)     Body mass index is 31.32 kg/m.  General: WDWN obese male in NAD GAIT: normal Eyes: PERRLA Pulmonary:  Respirations are non-labored, good air movement, CTAB, no rales,  rhonchi, or wheezing.  Cardiac: regular rhythm, no detected murmur.  VASCULAR EXAM Carotid Bruits Right Left   Negative Negative    Aorta is not palpable. Radial pulses are 2+ palpable and equal.                                                                                                                            LE Pulses Right Left       POPLITEAL  not palpable   not palpable       POSTERIOR TIBIAL  2+ palpable   2+ palpable        DORSALIS PEDIS      ANTERIOR TIBIAL not palpable  not palpable     Gastrointestinal: Soft, mild tenderness to palpation at umbilicus, RUQ, and RLQ, no palpable masses.  Musculoskeletal: No muscle atrophy/wasting. M/S 5/5 throughout, extremities without ischemic changes.  Neurologic: A&O X 3; Appropriate Affect, Speech is normal CN 2-12 intact, pain and light touch  intact in extremities except some numbness at left side of neck incision area. Motor exam as listed above.     Assessment: BARRET ESQUIVEL is a 66 y.o. male who is s/p left carotid endarterectomy by Dr. Donzetta Casey for asymptomatic disease along with a CABG x 4 vessels by Dr. Nils Casey on 11-14-15.  He has no history of a stroke or TIA.   His atherosclerotic risk factors include almost in control DM, 45 year smoking hx (quit August 2017), CAD, obesity, hypertension, dyslipidemia, and hx of a-fib (on Eliquis). He takes a daily statin.   DATA Today's carotid duplex suggests no significant stenosis right ECA or bilateral CCA. >50% stenosis of the left ECA.  Right proximal ICA: 40-59% stenosis. Left ICA (CEA site): patent with no restenosis. Bilateral vertebral artery flow is antegrade.  Bilateral subclavian artery waveforms are normal.  No significant change compared to the last exam on 12-16-15.    Plan: Follow-up in 6 months with Carotid Duplex scan.   I discussed in depth with the patient the nature of atherosclerosis, and emphasized the importance of maximal medical management including strict control of blood pressure, blood glucose, and lipid levels, obtaining regular exercise, and continued cessation of smoking.  The patient is aware that without maximal medical management the underlying atherosclerotic disease process will progress, limiting the benefit of any interventions. The patient was given information about stroke prevention and what symptoms should prompt the patient to seek immediate medical care. Thank you for allowing [place]  to participate in this patient's care.  Brian Chambers, RN, MSN, FNP-C [facility] Office: [phone]  Clinic Physician: Brian Casey  06/26/16 11:50 AM

## 2016-06-29 NOTE — Addendum Note (Signed)
Addended by: Lianne Cure A on: 06/29/2016 09:15 AM   Modules accepted: Orders

## 2016-07-08 NOTE — Progress Notes (Signed)
Cardiology Office Note  Date:  07/08/2016   ID:  Brian Casey, DOB Jul 25, 1950, MRN [id number]  PCP:  [name], MD   No chief complaint on file.   HPI:  Brian Casey is a 66 y.o. male with history of   CAD s/p 4-vessel CABG, s/p LICA endarerectomy 7/89/38,  PAF  on full-dose anticoagulation,  CKD stage II,  DM2,   HTN,  HLD,  hypothyroidism,  tobacco abuse for 48 years,  Nov 01 2015 chronic alcohol abuse  presents to clinic for follow up of his CAD And paroxysmal atrial fibrillation   He did a short period of cardiac rehabilitation following bypass surgery Balance improved no recent falls Occasional dizziness, does not correlate with blood pressure  Reports that he had is Gall bladder out, having chronic diarrhea Dramatic increase in weight previously 171 pounds, up to 185 pounds on his last clinic visit, now 201 Eating well  Lab work reviewed Total chol 145, LDL 57 HBA1C 6.0  Unclear if he continues to drink alcohol, not discussed with him today Receiving some of his medical care [facility] the [facility]  EKG on today's visit shows normal sinus rhythm with rate 63 bpm, nonspecific ST abnormality  Other past medical history reviewed  presented to [facility] on 11/04/15 with a 3-day history of RUQ pain  CT abdomen/pelvis that showed acute acalculus cholecystitis.  noted to be in new onset Afib with RVR His troponin peaked at 0.11. His Afib was rate controlled and ultimately converted to sinus rhythm on IV amiodarone.   Echo on 8/14 showed an EF of 50-55%, no RWMA, GR2DD, PASP normal.  He underwent Lexiscan Myoview on 11/06/15 that was high-risk with moderate sized region of moderate intensity in the lateral wall, EF 68%.  LHC showed severe 4-vessel CAD.   [facility] cardiac [facility] surgery.  He underwent successful 4-vessel CABG on 11/14/2015 with LIMA to LAD, SVG to Diag, SVG to RI, SVG to PDA.    found to have severe LICA stenosis at 10-17% and  underwent successful left carotid endarterectomy on 8/24. He had 60-79% RICA stenosis.   gallbladder drain placed  PMH:   has a past medical history of Acute renal failure ([place]) (11/06/2015); Anxiety; Arthritis; Depression; GERD (gastroesophageal reflux disease); High cholesterol; History of bronchitis; Hypertension (10/17/2013); Hyperthyroidism; Hypothyroidism; New onset atrial fibrillation ([place]) (11/06/2015); NSTEMI (non-ST elevated myocardial infarction) ([place]) (11/07/2015); Thyroid disease; Type II diabetes mellitus ([place]); and Wears glasses.  PSH:    Past Surgical History:  Procedure Laterality Date  . CARDIAC CATHETERIZATION Right 11/06/2015   Procedure: Left Heart Cath and Coronary Angiography;  Surgeon: Minna Merritts, MD;  Location: [place] [facility] [facility];  Service: Cardiovascular;  Laterality: Right;  . CHOLECYSTECTOMY N/A 01/17/2016   Procedure: LAPAROSCOPIC CHOLECYSTECTOMY WITH INTRAOPERATIVE CHOLANGIOGRAM;  Surgeon: Fanny Skates, MD;  Location: [place];  Service: General;  Laterality: N/A;  . COLONOSCOPY    . CORONARY ARTERY BYPASS GRAFT N/A 11/14/2015   Procedure: CORONARY ARTERY BYPASS GRAFTING (CABG) x 4;  Surgeon: Ivin Poot, MD;  Location: [place];  Service: Open Heart Surgery;  Laterality: N/A;  . ENDARTERECTOMY Left 11/14/2015   Procedure: LEFT ENDARTERECTOMY CAROTID;  Surgeon: Waynetta Sandy, MD;  Location: [place];  Service: Vascular;  Laterality: Left;  . FINGER GANGLION CYST EXCISION Left X 3   "index finger X 2; thumb X 1"  . [place]  . HERNIA REPAIR    . INSERTION OF MESH  N/A 11/16/2013   Procedure: INSERTION OF MESH;  Surgeon: Imogene Burn. Georgette Dover, MD;  Location: [place];  Service: General;  Laterality: N/A;  . IR GENERIC HISTORICAL  11/08/2015   IR PERC CHOLECYSTOSTOMY 11/08/2015 Corrie Mckusick, DO [facility]  . IR GENERIC HISTORICAL  12/13/2015   IR CHOLANGIOGRAM EXISTING TUBE 12/13/2015 [facility]-INTERV RAD  . IR GENERIC HISTORICAL  12/25/2015   IR  CATHETER TUBE CHANGE 12/25/2015 Sandi Mariscal, MD [facility]  . IR GENERIC HISTORICAL  12/12/2015   IR RADIOLOGIST EVAL & MGMT 12/12/2015 Greggory Keen, MD [facility]  . TEE WITHOUT CARDIOVERSION N/A 11/14/2015   Procedure: TRANSESOPHAGEAL ECHOCARDIOGRAM (TEE);  Surgeon: Ivin Poot, MD;  Location: [place];  Service: Open Heart Surgery;  Laterality: N/A;  . UMBILICAL HERNIA REPAIR N/A 11/16/2013   Procedure: UMBILICAL HERNIA REPAIR;  Surgeon: Imogene Burn. Georgette Dover, MD;  Location: [place];  Service: General;  Laterality: N/A;  . VEIN HARVEST Right 11/14/2015   Procedure: RIGHT LEG GREATER SAPHENOUS VEIN HARVEST;  Surgeon: Ivin Poot, MD;  Location: [place];  Service: Open Heart Surgery;  Laterality: Right;    Current Outpatient Prescriptions  Medication Sig Dispense Refill  . apixaban (ELIQUIS) 5 MG TABS tablet Take 1 tablet (5 mg total) by mouth 2 (two) times daily. 60 tablet 6  . atorvastatin (LIPITOR) 40 MG tablet Take 40 mg by mouth daily.    Marland Kitchen escitalopram (LEXAPRO) 10 MG tablet Take 1 tablet (10 mg total) by mouth daily. 30 tablet 0  . levothyroxine (SYNTHROID, LEVOTHROID) 100 MCG tablet Take 1 tablet (100 mcg total) by mouth daily before breakfast. OFFICE VISIT WITH  LABS REQUIRED FOR ADDITIONAL REFILLS 30 tablet 0  . metFORMIN (GLUCOPHAGE) 500 MG tablet Take 500 mg by mouth 2 (two) times daily with a meal.    . metoprolol tartrate (LOPRESSOR) 25 MG tablet TAKE 1/2 OF A TABLET (12.5 MG TOTAL) BY MOUTH 2 (TWO) TIMES DAILY. 90 tablet 0  . omeprazole (PRILOSEC) 20 MG capsule Take 1 capsule (20 mg total) by mouth daily. OFFICE VISIT REQUIRED FOR ADDITIONAL REFILLS 30 capsule 0   No current facility-administered medications for this visit.      Allergies:   Penicillins and Bupropion hcl   Social History:  The patient  reports that he has quit smoking. His smoking use included Cigarettes. He has a 5.76 pack-year smoking history. He has never used smokeless tobacco. He reports that he drinks  about 34.2 oz of alcohol per week . He reports that he does not use drugs.   Family History:   family history includes Cancer in his father, maternal grandmother, and mother; Colon cancer in his mother; Depression in his mother; Diabetes in his maternal grandmother and mother; Hypertension in his father and mother; Hyperthyroidism in his father.    Review of Systems: Review of Systems  Constitutional: Negative.   Respiratory: Negative.   Cardiovascular: Negative.   Gastrointestinal: Negative.   Musculoskeletal: Negative.   Neurological: Positive for dizziness.  Psychiatric/Behavioral: Negative.   All other systems reviewed and are negative.    PHYSICAL EXAM: VS:  There were no vitals taken for this visit. , BMI There is no height or weight on file to calculate BMI. GEN: Well nourished, well developed, in no acute distress , balance issues getting from chair to table, did not work prior assistance HEENT: normal  Neck: no JVD, carotid bruits, or masses Cardiac: RRR; no murmurs, rubs, or gallops,no edema , well healed mediastinal scar Respiratory:  clear to auscultation bilaterally, normal work of breathing GI: soft, nontender, nondistended, + BS MS: no deformity or atrophy  Skin: warm and dry, no rash Neuro:  Strength and sensation are intact Psych: euthymic mood, full affect    Recent Labs: 11/08/2015: B Natriuretic Peptide 907.9 11/09/2015: TSH 4.905 11/15/2015: Magnesium 2.5 01/15/2016: ALT 29; BUN 8; Creatinine, Ser 1.08; Hemoglobin 13.1; Platelets 294; Potassium 4.0; Sodium 137    Lipid Panel Lab Results  Component Value Date   CHOL 150 10/30/2014   HDL 42.30 10/30/2014   LDLCALC 88 10/30/2014   TRIG 437 (H) 11/09/2015      Wt Readings from Last 3 Encounters:  06/26/16 200 lb (90.7 kg)  04/02/16 185 lb 12 oz (84.3 kg)  04/01/16 185 lb 14.4 oz (84.3 kg)       ASSESSMENT AND PLAN:  Mixed hyperlipidemia Encouraged him to stay on Lipitor 40 mg  daily  Orthostasis Previous orthostasis symptoms Resolved, blood pressure stable Plans last clinic visit there was no drop in pressure with standing to explain his occasional dizziness  gait instability, balance problems possibly from long-standing alcohol abuse, neurologic issues  PAF (paroxysmal atrial fibrillation) (HCC) CHADs VASC of 5  on eliquis. No recent falls   Coronary artery disease involving native coronary artery of native heart with angina pectoris with documented spasm (HCC) Currently with no symptoms of angina. No further workup at this time. Continue current medication regimen.  Aortic atherosclerosis (HCC) continue aggressive cholesterol management   Stenosis of left carotid artery History of carotid endarterectomy  Uncontrolled type 2 diabetes mellitus with other circulatory complication, without long-term current use of insulin (HCC) Dramatic climb in weight over the past several months, 30 pounds We have encouraged continued exercise, careful diet management in an effort to lose weight.  S/P CABG x 4 Stable, no significant anginal symptoms  Alcohol abuse Recommended alcohol cessation   Total encounter time more than 25 minutes  Greater than 50% was spent in counseling and coordination of care with the patient   Disposition:   F/U  6 months    No orders of the defined types were placed in this encounter.    Signed, Esmond Plants, M.D., Ph.D. 07/08/2016  [place], [place]

## 2016-07-09 ENCOUNTER — Ambulatory Visit (INDEPENDENT_AMBULATORY_CARE_PROVIDER_SITE_OTHER): Payer: BLUE CROSS/BLUE SHIELD | Admitting: Cardiovascular Disease

## 2016-07-09 ENCOUNTER — Ambulatory Visit: Payer: BLUE CROSS/BLUE SHIELD | Admitting: Cardiovascular Disease

## 2016-07-09 ENCOUNTER — Encounter: Payer: Self-pay | Admitting: Cardiovascular Disease

## 2016-07-09 VITALS — BP 152/78 | HR 63 | Ht 67.0 in | Wt 201.2 lb

## 2016-07-09 DIAGNOSIS — E1159 Type 2 diabetes mellitus with other circulatory complications: Secondary | ICD-10-CM | POA: Diagnosis not present

## 2016-07-09 DIAGNOSIS — I10 Essential (primary) hypertension: Secondary | ICD-10-CM | POA: Diagnosis not present

## 2016-07-09 DIAGNOSIS — I739 Peripheral vascular disease, unspecified: Secondary | ICD-10-CM

## 2016-07-09 DIAGNOSIS — I25111 Atherosclerotic heart disease of native coronary artery with angina pectoris with documented spasm: Secondary | ICD-10-CM | POA: Diagnosis not present

## 2016-07-09 DIAGNOSIS — E1165 Type 2 diabetes mellitus with hyperglycemia: Secondary | ICD-10-CM | POA: Diagnosis not present

## 2016-07-09 DIAGNOSIS — F1010 Alcohol abuse, uncomplicated: Secondary | ICD-10-CM | POA: Diagnosis not present

## 2016-07-09 DIAGNOSIS — N182 Chronic kidney disease, stage 2 (mild): Secondary | ICD-10-CM

## 2016-07-09 DIAGNOSIS — IMO0002 Reserved for concepts with insufficient information to code with codable children: Secondary | ICD-10-CM

## 2016-07-09 DIAGNOSIS — I779 Disorder of arteries and arterioles, unspecified: Secondary | ICD-10-CM

## 2016-07-09 DIAGNOSIS — E782 Mixed hyperlipidemia: Secondary | ICD-10-CM | POA: Diagnosis not present

## 2016-07-09 DIAGNOSIS — I480 Paroxysmal atrial fibrillation: Secondary | ICD-10-CM | POA: Diagnosis not present

## 2016-07-09 DIAGNOSIS — I700 Atherosclerosis of aorta: Secondary | ICD-10-CM

## 2016-07-09 NOTE — Patient Instructions (Addendum)
Medication Instructions:   Check the price of metoprolol succinate, once a day (you are on metoprolol tartrate, twice a day  ACE inhibitor Lisinopril 5 mg once a day   Labwork:  No new labs needed  Testing/Procedures:  No further testing at this time   I recommend watching educational videos on topics of interest to you at:       www.goemmi.com  Enter code: [email or web address]    Follow-Up: It was a pleasure seeing you in the office today. Please call us if you have new issues that need to be addressed before your next appt.  [phone]  Your physician wants you to follow-up in: 6 months.  You will receive a reminder letter in the mail two months in advance. If you don't receive a letter, please call our office to schedule the follow-up appointment.  If you need a refill on your cardiac medications before your next appointment, please call your pharmacy.

## 2016-07-14 ENCOUNTER — Telehealth: Payer: Self-pay

## 2016-07-14 NOTE — Telephone Encounter (Signed)
Spoke with [name] at [facility] regarding [place]. Explained to her that we do not complete LTD paperwork at this office and that we refer the patient to their PCP. [name] will talk to the pt and get this sorted out.

## 2016-07-17 ENCOUNTER — Telehealth: Payer: Self-pay | Admitting: Cardiovascular Disease

## 2016-07-17 ENCOUNTER — Encounter: Payer: Self-pay | Admitting: Vascular Surgery

## 2016-07-17 NOTE — Telephone Encounter (Signed)
Received records request [facility], forwarded to [facility] for processing.

## 2016-07-27 ENCOUNTER — Encounter: Payer: Self-pay | Admitting: Vascular Surgery

## 2016-08-13 ENCOUNTER — Telehealth: Payer: Self-pay | Admitting: Cardiovascular Disease

## 2016-08-13 NOTE — Telephone Encounter (Signed)
Received records request From [facility], forwarded to [facility] for processing.

## 2016-08-14 ENCOUNTER — Telehealth: Payer: Self-pay | Admitting: Cardiovascular Disease

## 2016-08-14 NOTE — Telephone Encounter (Signed)
Received records request pARAMEDS , forwarded to [facility] for processing.

## 2016-09-07 DIAGNOSIS — Z0279 Encounter for issue of other medical certificate: Secondary | ICD-10-CM | POA: Diagnosis not present

## 2016-09-18 ENCOUNTER — Telehealth: Payer: Self-pay | Admitting: Cardiovascular Disease

## 2016-09-18 NOTE — Telephone Encounter (Signed)
Received records request Disability Determination Services, forwarded to [employer] for processing.  

## 2016-09-28 ENCOUNTER — Telehealth: Payer: Self-pay | Admitting: Cardiovascular Disease

## 2016-09-28 NOTE — Telephone Encounter (Signed)
Patient calling the office for samples of medication:   1.  What medication and dosage are you requesting samples for? Brian Casey  2.  Are you currently out of this medication?  Yes

## 2016-09-28 NOTE — Telephone Encounter (Signed)
Samples placed up front.   3 boxes Eliquis 5MG LOT# [id number] EXP: 10/20

## 2016-10-01 ENCOUNTER — Telehealth: Payer: Self-pay | Admitting: Cardiovascular Disease

## 2016-10-01 NOTE — Telephone Encounter (Signed)
Pt dropped off paperwork from [facility] stating that they have not received requested records from our office.  I see that pt had disability paperwork sent to [place] on 09/18/16.  Am forwarding to front office, as this is not a nursing issue. 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  I think if he can build a rapport w/ one of the ladies up front, he will be more forthcoming w/ information.

## 2016-10-01 NOTE — Telephone Encounter (Signed)
Pt dropped by to speak with Brian Casey about insurance issue Left copy of paperwork in nurse box Please call today if possible

## 2016-10-02 NOTE — Telephone Encounter (Signed)
Spoke to [place]  We found out that the letter patient Received from [facility] about his disability was sent to him no 09/22/16.  [employer] sent all the records to [facility] on 09/23/16 The letter was sent out the day before we sent everything to them.  We sent all his records from 11/27/16 to present.   Then called patient and explained to him this. He was appreciative and was thankful we cleared things up.    Nothing further needed.

## 2016-10-20 ENCOUNTER — Telehealth: Payer: Self-pay | Admitting: Cardiovascular Disease

## 2016-10-20 NOTE — Telephone Encounter (Signed)
Patient calling the office for samples of medication:   1. What medication and dosage are you requesting samples for? Eliquis  2. Are you currentlyout of this medication?  Yes   Pt is switching over to [place], would like until he switches over

## 2016-10-20 NOTE — Telephone Encounter (Signed)
Eliquis 5 mg tablet placed at front desk for pick up.

## 2016-10-23 ENCOUNTER — Telehealth: Payer: Self-pay | Admitting: Cardiovascular Disease

## 2016-10-23 NOTE — Telephone Encounter (Signed)
Pt states the [facility] needs notes supporting why he needs to take Eliquis. Fax # [phone] Attn Jeannene Patella Also fax to Amy Dr Kary Kos [phone]

## 2016-10-23 NOTE — Telephone Encounter (Signed)
Olin Hauser From [place] states the ov was not received via fax.  Resent via epic routing.

## 2016-10-23 NOTE — Telephone Encounter (Signed)
Pt asks if we can send him a mychart message letting hiim know this has been completed.

## 2016-10-23 NOTE — Telephone Encounter (Signed)
Routed to fax #s provided.  Message sent to pt.

## 2016-12-01 ENCOUNTER — Encounter: Payer: Self-pay | Admitting: Cardiovascular Disease

## 2016-12-30 ENCOUNTER — Encounter (HOSPITAL_COMMUNITY): Payer: BLUE CROSS/BLUE SHIELD

## 2016-12-30 ENCOUNTER — Ambulatory Visit: Payer: BLUE CROSS/BLUE SHIELD | Admitting: Family

## 2017-05-03 DIAGNOSIS — I10 Essential (primary) hypertension: Secondary | ICD-10-CM | POA: Diagnosis not present

## 2017-05-03 DIAGNOSIS — E039 Hypothyroidism, unspecified: Secondary | ICD-10-CM | POA: Diagnosis not present

## 2017-05-03 DIAGNOSIS — E119 Type 2 diabetes mellitus without complications: Secondary | ICD-10-CM | POA: Diagnosis not present

## 2017-05-10 DIAGNOSIS — E119 Type 2 diabetes mellitus without complications: Secondary | ICD-10-CM | POA: Diagnosis not present

## 2017-05-10 DIAGNOSIS — D649 Anemia, unspecified: Secondary | ICD-10-CM | POA: Diagnosis not present

## 2017-05-10 DIAGNOSIS — E039 Hypothyroidism, unspecified: Secondary | ICD-10-CM | POA: Diagnosis not present

## 2017-05-10 DIAGNOSIS — I10 Essential (primary) hypertension: Secondary | ICD-10-CM | POA: Diagnosis not present

## 2017-08-09 IMAGING — CR DG CHEST 2V
2 series · 2 of 2 positions shown · non-contrast
Comparison: PA and lateral chest x-ray November 07, 2015

CLINICAL DATA: History of gastroesophageal reflux, hypertension,
diabetes, coronary artery disease, current smoker. The patient
underwent cholecystostomy on November 08, 2015.

EXAM:
CHEST  2 VIEW

[chest lat]
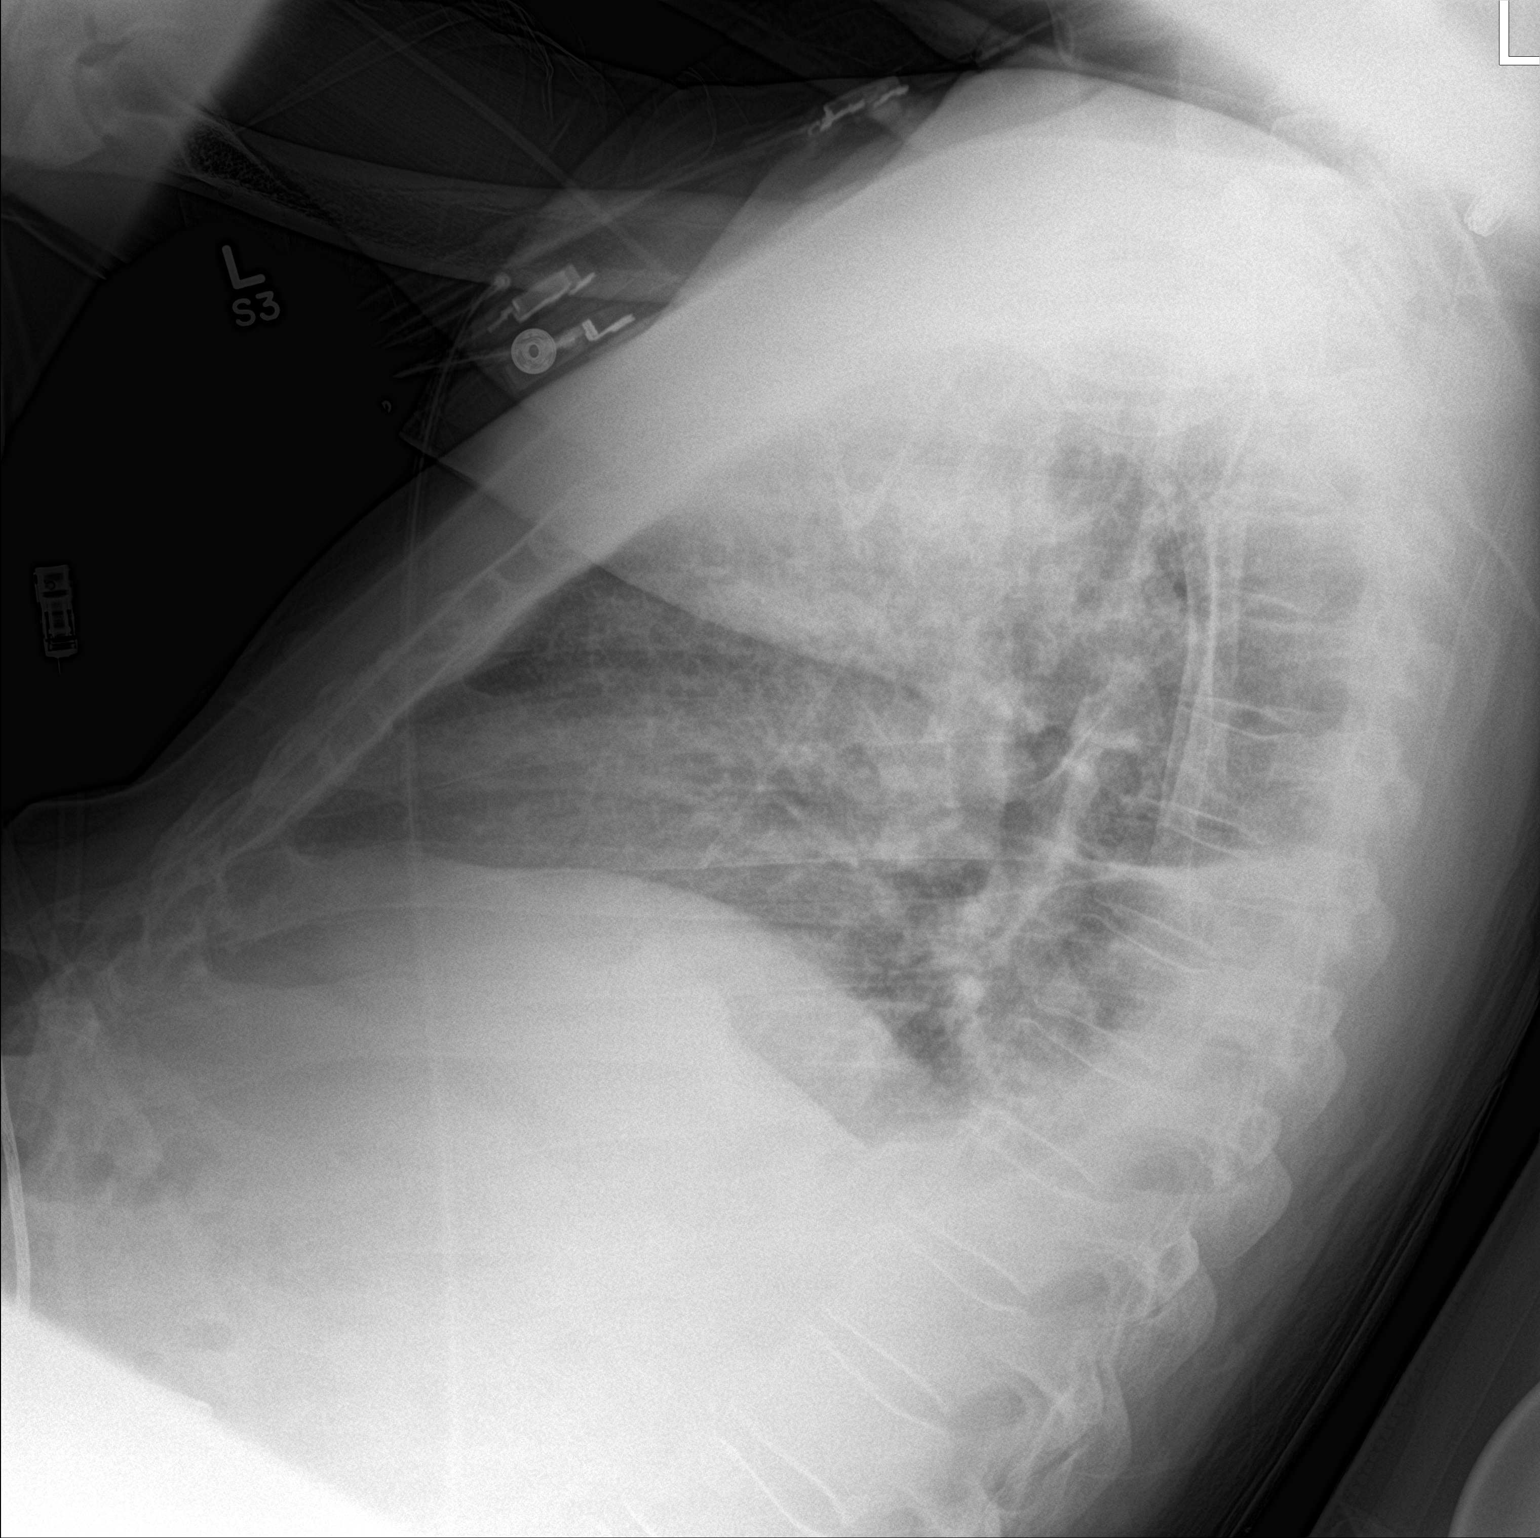

[chest ap]
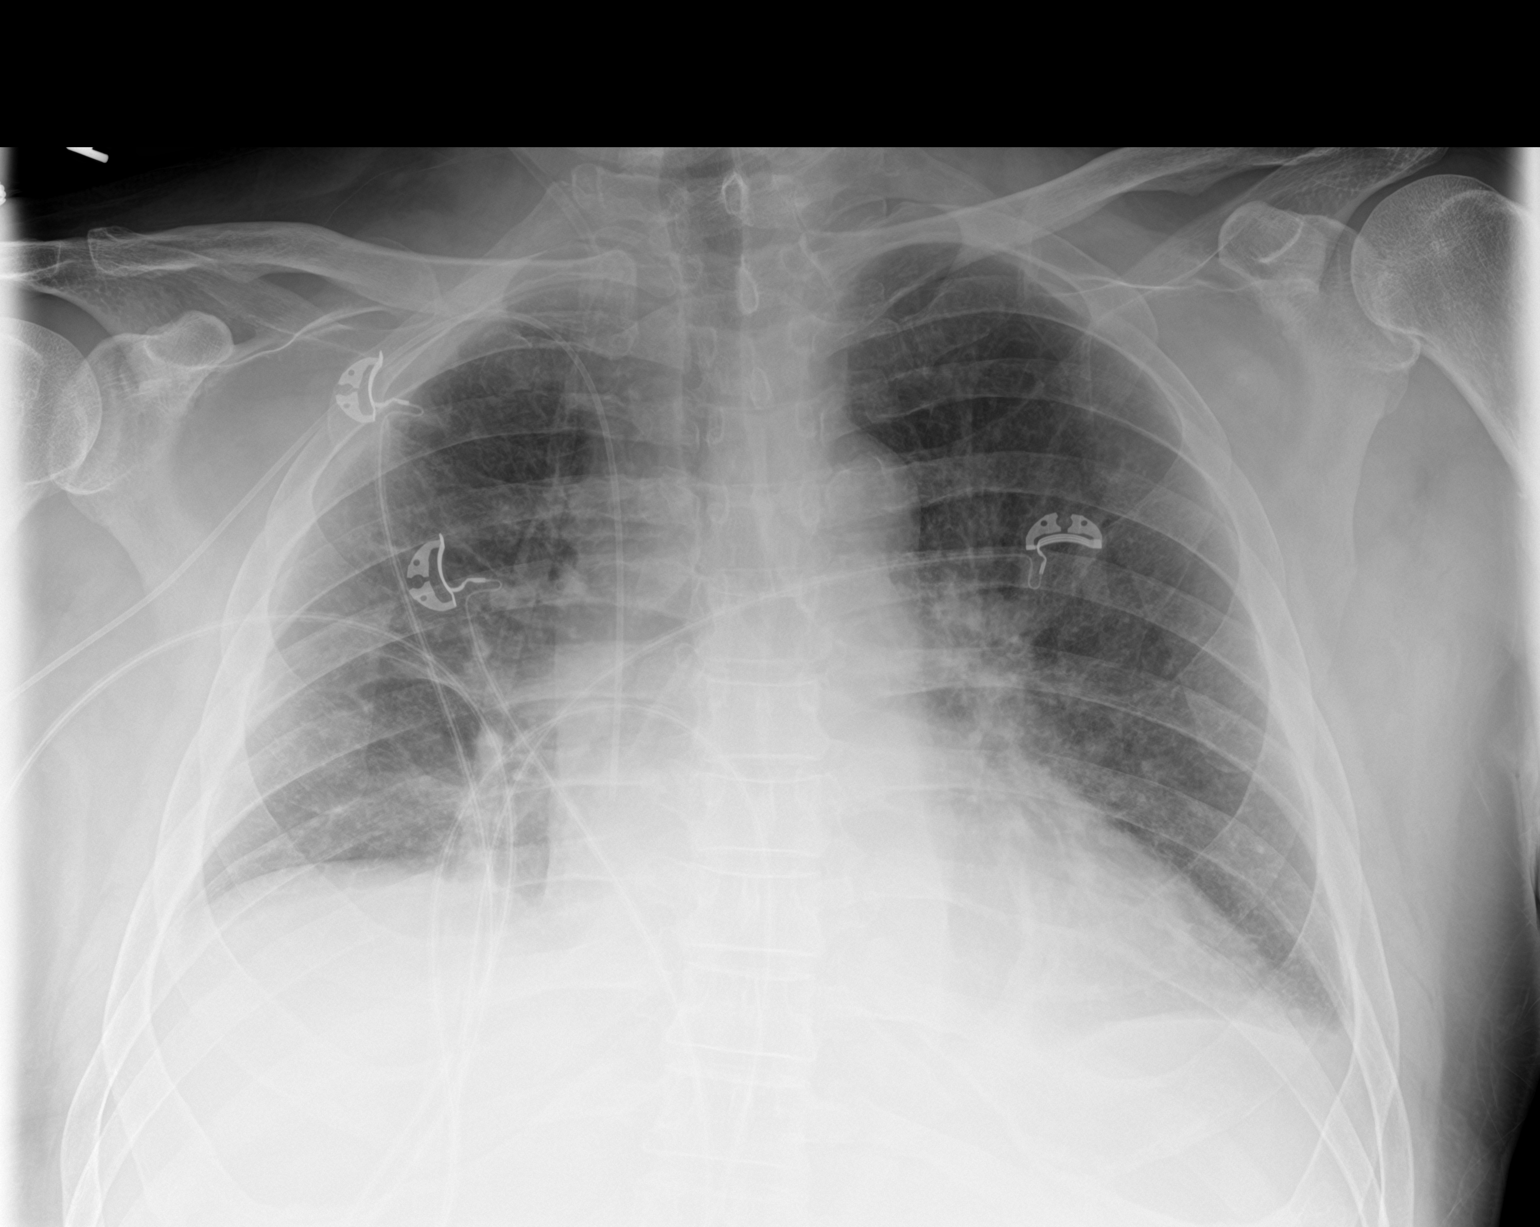

[2 of 2 positions shown; findings below may reference images not displayed]

FINDINGS: There remains mild volume loss on the right. The left lung is better
inflated. There is persistent left basilar density. There are small
bilateral pleural effusions layering posterior. The cardiac
silhouette remains enlarged. The central pulmonary vascularity is
engorged. There is calcification in the wall of the aortic arch. The
PICC line tip projects over the midportion of the SVC. The bony
thorax exhibits no acute abnormality.
IMPRESSION: Mild CHF with bilateral pleural effusions. Left lower lobe
atelectasis is suspected as well. No significant change since the
earlier study.

## 2017-08-10 IMAGING — CT CT CHEST W/ CM
2 of 3 series · 13 of 36 positions shown, 16 images · IV contrast (Iodine)
Comparison: Chest radiograph on 11/12/2015

CLINICAL DATA: Shortness of breath.  COPD.

EXAM:
CT CHEST WITH CONTRAST
TECHNIQUE: Multidetector CT imaging of the chest was performed during
intravenous contrast administration.
CONTRAST:  75mL MMZBCM-6EE IOPAMIDOL (MMZBCM-6EE) INJECTION 61%

[Series 201: chest with, idose (2) · axial · 0.77mm/px · z∈[+6,+246]mm · 10 of 114 slices shown, 13 images]
[im 9/114  mediastinal]
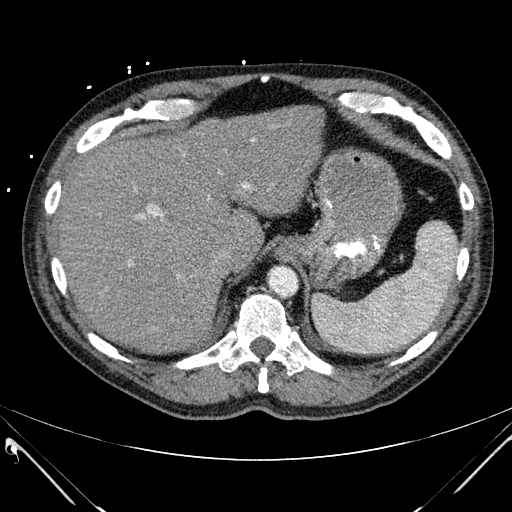
[im 9/114  lung]
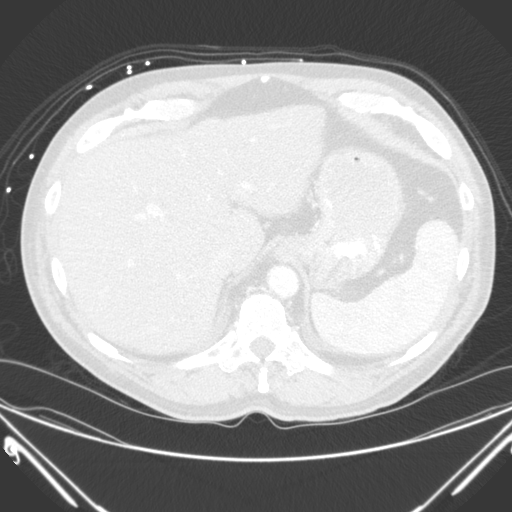
[im 17/114  lung]
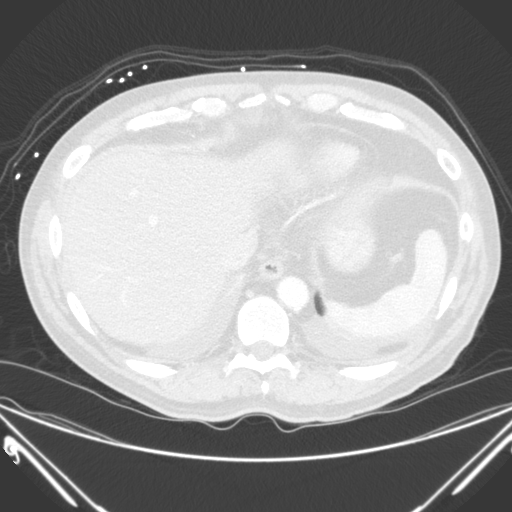
[im 30/114  lung]
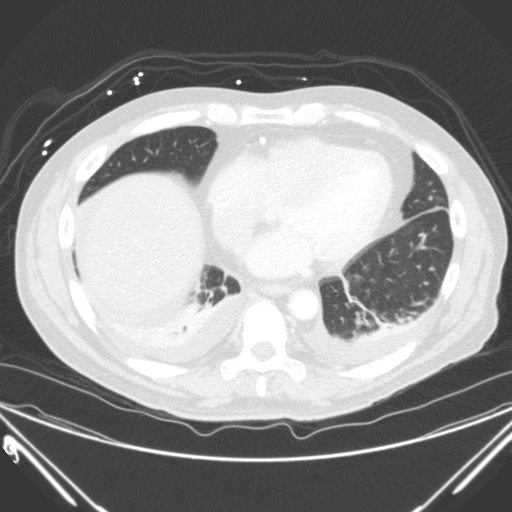
[im 42/114  lung]
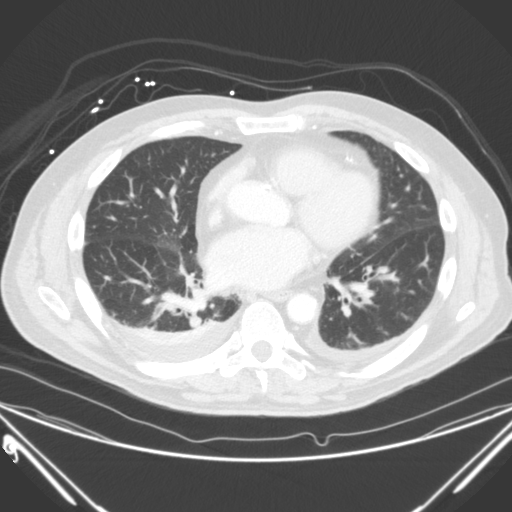
[im 51/114  mediastinal]
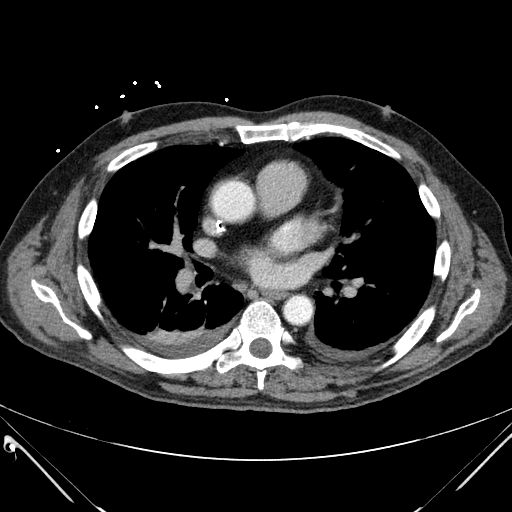
[im 51/114  lung]
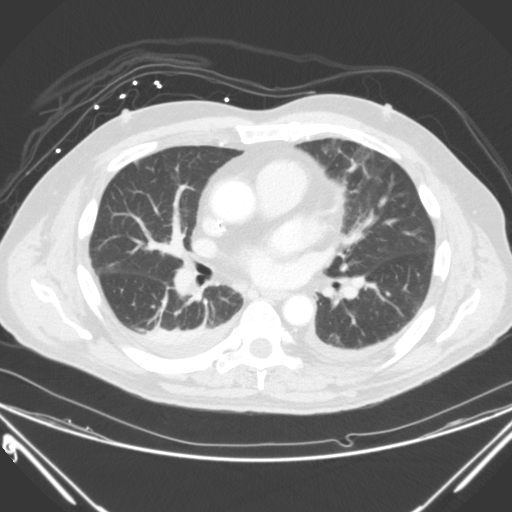
[im 63/114  lung]
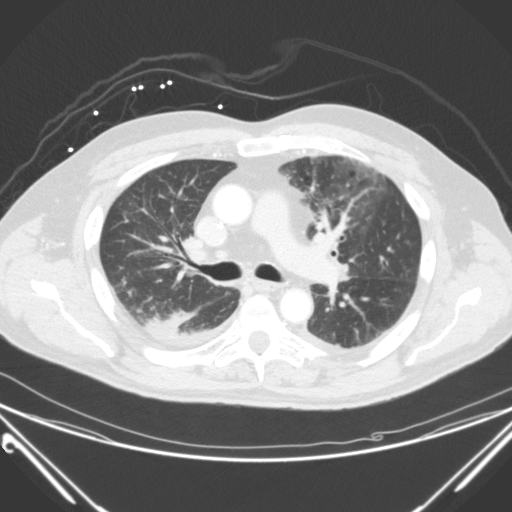
[im 72/114  lung]
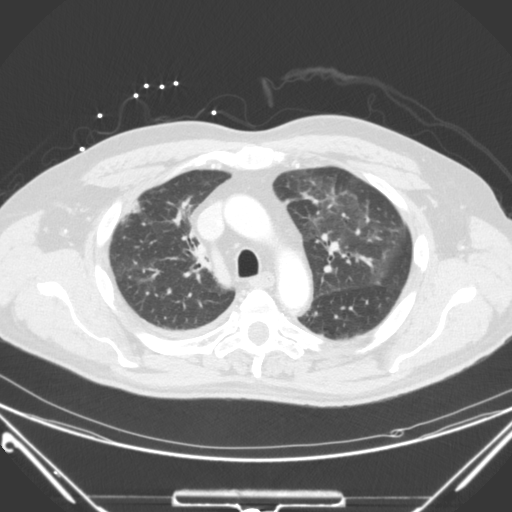
[im 84/114  lung]
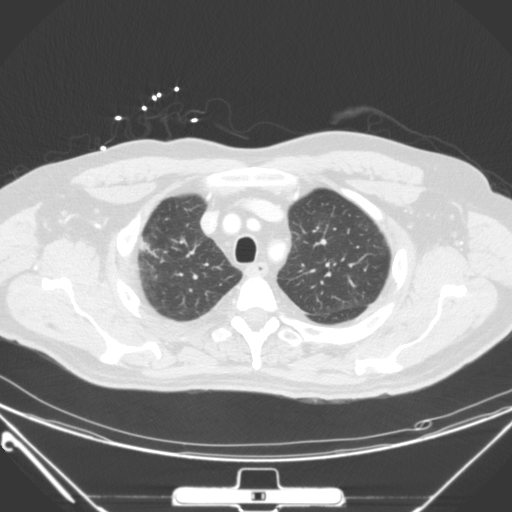
[im 97/114  mediastinal]
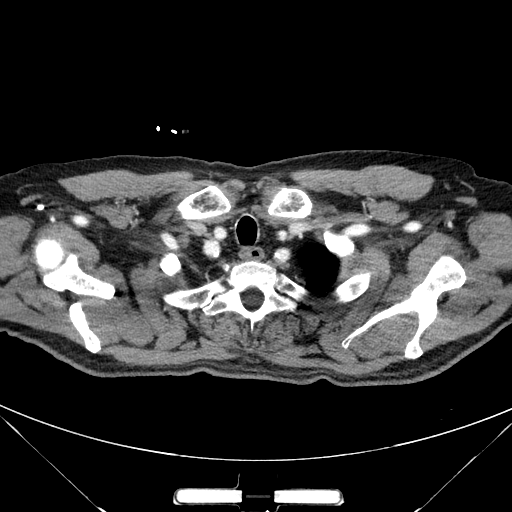
[im 97/114  lung]
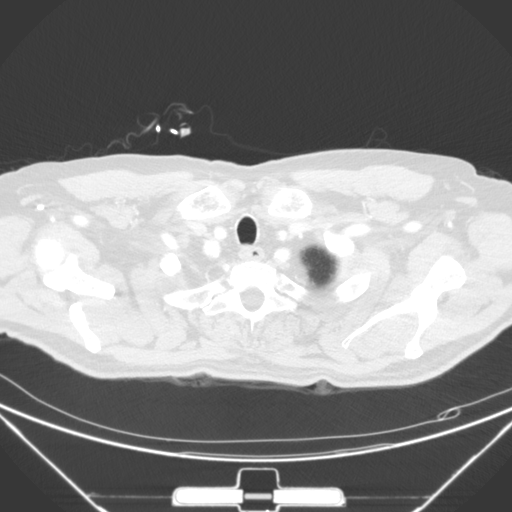
[im 105/114  lung]
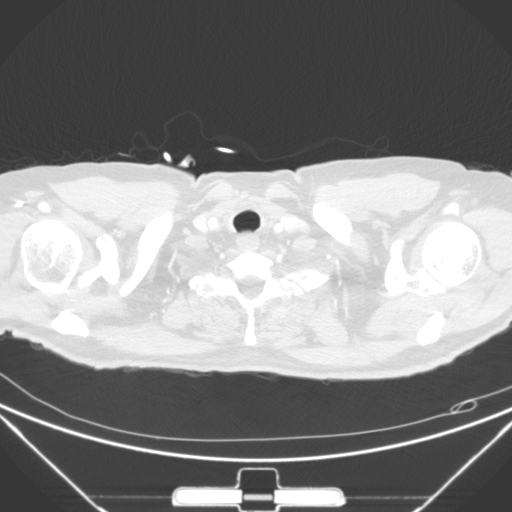

[Series 203: coronal, idose (2) · coronal · 0.45mm/px · 3 of 143 slices shown]
[im 29/143  lung]
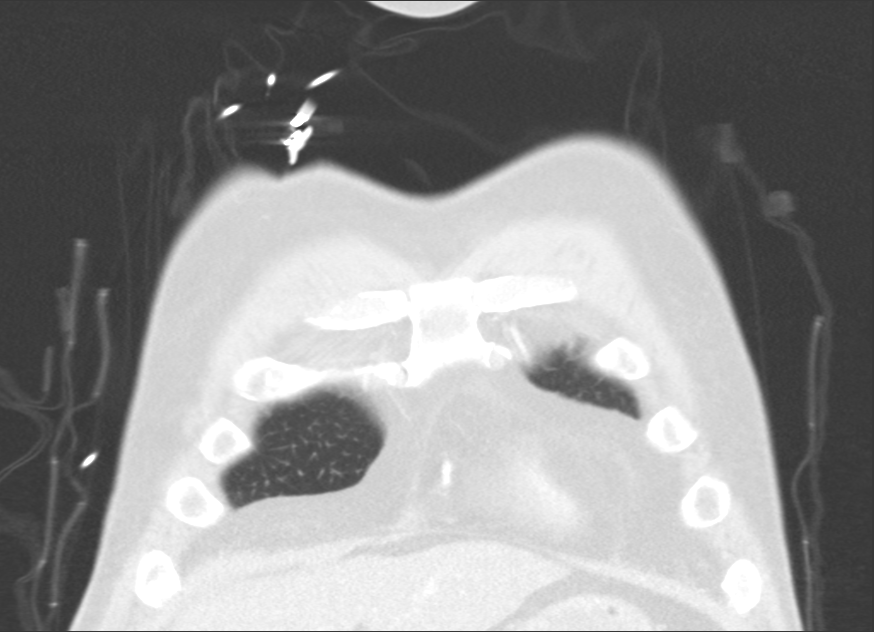
[im 57/143  lung]
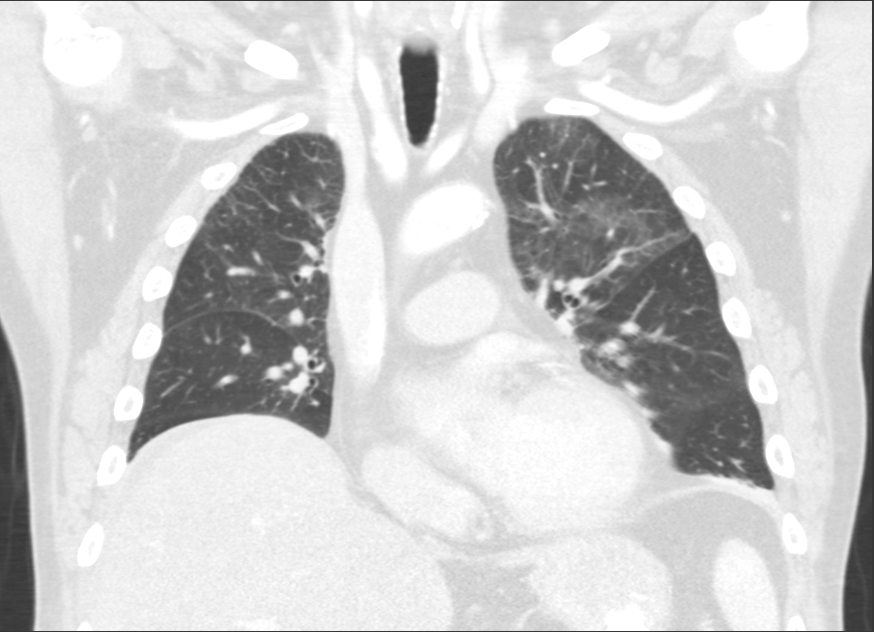
[im 86/143  lung]
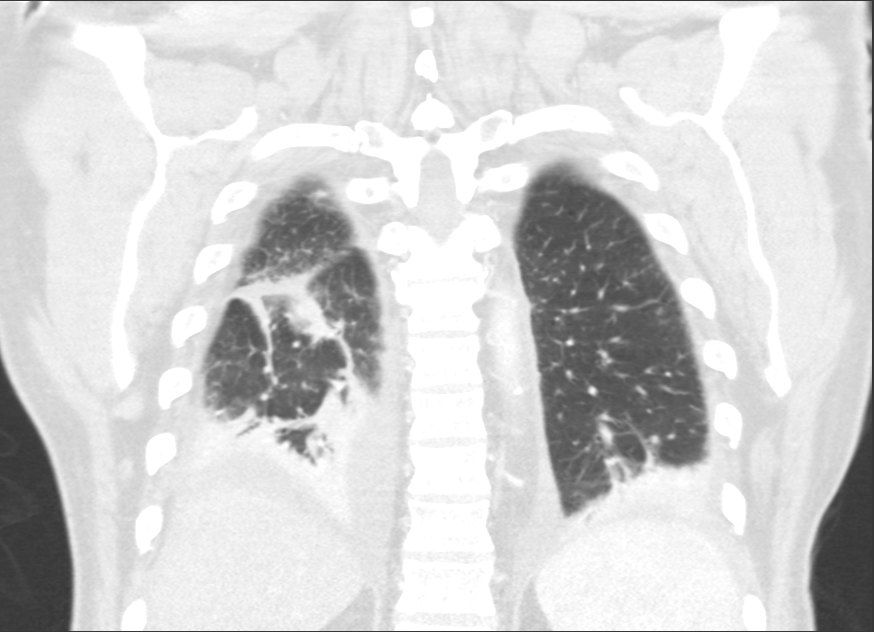

[13 of 36 positions shown; findings below may reference images not displayed]

FINDINGS: Cardiovascular: Heart size within normal limits. Three-vessel
coronary artery calcification. Aortic atherosclerosis. Right
subclavian central venous catheter tip in distal SVC.

Mediastinum/Nodes: No masses, pathologically enlarged lymph nodes,
or other significant abnormality.

Lungs/Pleura: Small pleural effusions seen bilaterally with mild
dependent atelectasis. Patchy areas of airspace disease are seen in
both upper lobes, most likely due to infectious or inflammatory
process. No evidence of pulmonary mass. Central tracheobronchial
airways are patent.

Upper Abdomen: Diffuse hepatic steatosis and minimal perihepatic
ascites.

Musculoskeletal: No chest wall mass or suspicious bone lesions
identified.
IMPRESSION: Patchy bilateral upper lobe airspace disease, most likely infectious
or inflammatory in etiology.

Small bilateral pleural effusions and bibasilar atelectasis.

No evidence of mass or lymphadenopathy.

Hepatic steatosis and minimal perihepatic ascites.

Aortic atherosclerosis and three-vessel coronary artery
calcification.

## 2017-08-27 ENCOUNTER — Inpatient Hospital Stay
Admission: EM | Admit: 2017-08-27 | Discharge: 2017-08-29 | DRG: 187 | Disposition: A | Discharge status: Home / Self Care | Payer: No Typology Code available for payment source | Attending: Internal Medicine | Admitting: Internal Medicine

## 2017-08-27 ENCOUNTER — Other Ambulatory Visit: Payer: Self-pay

## 2017-08-27 ENCOUNTER — Emergency Department: Payer: No Typology Code available for payment source

## 2017-08-27 ENCOUNTER — Inpatient Hospital Stay: Payer: No Typology Code available for payment source

## 2017-08-27 DIAGNOSIS — I480 Paroxysmal atrial fibrillation: Secondary | ICD-10-CM | POA: Diagnosis not present

## 2017-08-27 DIAGNOSIS — I2510 Atherosclerotic heart disease of native coronary artery without angina pectoris: Secondary | ICD-10-CM | POA: Diagnosis not present

## 2017-08-27 DIAGNOSIS — Z7901 Long term (current) use of anticoagulants: Secondary | ICD-10-CM | POA: Diagnosis not present

## 2017-08-27 DIAGNOSIS — T502X5A Adverse effect of carbonic-anhydrase inhibitors, benzothiadiazides and other diuretics, initial encounter: Secondary | ICD-10-CM | POA: Diagnosis not present

## 2017-08-27 DIAGNOSIS — I129 Hypertensive chronic kidney disease with stage 1 through stage 4 chronic kidney disease, or unspecified chronic kidney disease: Secondary | ICD-10-CM | POA: Diagnosis not present

## 2017-08-27 DIAGNOSIS — Z880 Allergy status to penicillin: Secondary | ICD-10-CM | POA: Diagnosis not present

## 2017-08-27 DIAGNOSIS — E860 Dehydration: Secondary | ICD-10-CM | POA: Diagnosis not present

## 2017-08-27 DIAGNOSIS — Z87891 Personal history of nicotine dependence: Secondary | ICD-10-CM

## 2017-08-27 DIAGNOSIS — J90 Pleural effusion, not elsewhere classified: Secondary | ICD-10-CM | POA: Diagnosis not present

## 2017-08-27 DIAGNOSIS — R05 Cough: Secondary | ICD-10-CM | POA: Diagnosis not present

## 2017-08-27 DIAGNOSIS — Z79899 Other long term (current) drug therapy: Secondary | ICD-10-CM | POA: Diagnosis not present

## 2017-08-27 DIAGNOSIS — E7800 Pure hypercholesterolemia, unspecified: Secondary | ICD-10-CM | POA: Diagnosis not present

## 2017-08-27 DIAGNOSIS — E039 Hypothyroidism, unspecified: Secondary | ICD-10-CM | POA: Diagnosis not present

## 2017-08-27 DIAGNOSIS — Z951 Presence of aortocoronary bypass graft: Secondary | ICD-10-CM

## 2017-08-27 DIAGNOSIS — Z79890 Hormone replacement therapy (postmenopausal): Secondary | ICD-10-CM

## 2017-08-27 DIAGNOSIS — K219 Gastro-esophageal reflux disease without esophagitis: Secondary | ICD-10-CM | POA: Diagnosis present

## 2017-08-27 DIAGNOSIS — E871 Hypo-osmolality and hyponatremia: Secondary | ICD-10-CM

## 2017-08-27 DIAGNOSIS — E1122 Type 2 diabetes mellitus with diabetic chronic kidney disease: Secondary | ICD-10-CM | POA: Diagnosis not present

## 2017-08-27 DIAGNOSIS — N179 Acute kidney failure, unspecified: Secondary | ICD-10-CM | POA: Diagnosis present

## 2017-08-27 DIAGNOSIS — R0602 Shortness of breath: Secondary | ICD-10-CM | POA: Diagnosis not present

## 2017-08-27 DIAGNOSIS — R9431 Abnormal electrocardiogram [ECG] [EKG]: Secondary | ICD-10-CM | POA: Diagnosis not present

## 2017-08-27 DIAGNOSIS — I252 Old myocardial infarction: Secondary | ICD-10-CM

## 2017-08-27 DIAGNOSIS — N183 Chronic kidney disease, stage 3 (moderate): Secondary | ICD-10-CM | POA: Diagnosis present

## 2017-08-27 DIAGNOSIS — Z7984 Long term (current) use of oral hypoglycemic drugs: Secondary | ICD-10-CM | POA: Diagnosis not present

## 2017-08-27 LAB — CBC WITH DIFFERENTIAL/PLATELET
BASOS PCT: 0 %
Basophils Absolute: 0 10*3/uL (ref 0–0.1)
EOS ABS: 0.4 10*3/uL (ref 0–0.7)
Eosinophils Relative: 4 %
HCT: 36.2 % — ABNORMAL LOW (ref 40.0–52.0)
Hemoglobin: 12.6 g/dL — ABNORMAL LOW (ref 13.0–18.0)
LYMPHS ABS: 0.9 10*3/uL — AB (ref 1.0–3.6)
Lymphocytes Relative: 9 %
MCH: 33.8 pg (ref 26.0–34.0)
MCHC: 34.9 g/dL (ref 32.0–36.0)
MCV: 96.8 fL (ref 80.0–100.0)
Monocytes Absolute: 0.8 10*3/uL (ref 0.2–1.0)
Monocytes Relative: 8 %
Neutro Abs: 7.6 10*3/uL — ABNORMAL HIGH (ref 1.4–6.5)
Neutrophils Relative %: 79 %
PLATELETS: 324 10*3/uL (ref 150–440)
RBC: 3.74 MIL/uL — AB (ref 4.40–5.90)
RDW: 12.8 % (ref 11.5–14.5)
WBC: 9.7 10*3/uL (ref 3.8–10.6)

## 2017-08-27 LAB — PROCALCITONIN: Procalcitonin: <0.1 ng/mL

## 2017-08-27 LAB — COMPREHENSIVE METABOLIC PANEL
ALT: 17 U/L (ref 17–63)
ANION GAP: 11 (ref 5–15)
AST: 19 U/L (ref 15–41)
Albumin: 3.5 g/dL (ref 3.5–5.0)
Alkaline Phosphatase: 56 U/L (ref 38–126)
BUN: 18 mg/dL (ref 6–20)
CHLORIDE: 91 mmol/L — AB (ref 101–111)
CO2: 23 mmol/L (ref 22–32)
Calcium: 8.4 mg/dL — ABNORMAL LOW (ref 8.9–10.3)
Creatinine, Ser: 1.76 mg/dL — ABNORMAL HIGH (ref 0.61–1.24)
GFR calc non Af Amer: 39 mL/min — ABNORMAL LOW (ref >60)
GFR, EST AFRICAN AMERICAN: 45 mL/min — AB (ref >60)
Glucose, Bld: 142 mg/dL — ABNORMAL HIGH (ref 65–99)
Potassium: 5.1 mmol/L (ref 3.5–5.1)
SODIUM: 125 mmol/L — AB (ref 135–145)
Total Bilirubin: 1 mg/dL (ref 0.3–1.2)
Total Protein: 6.8 g/dL (ref 6.5–8.1)

## 2017-08-27 LAB — PROTIME-INR
INR: 1.53
Prothrombin Time: 18.3 seconds — ABNORMAL HIGH (ref 11.4–15.2)

## 2017-08-27 LAB — GLUCOSE, CAPILLARY: Glucose-Capillary: 135 mg/dL — ABNORMAL HIGH (ref 65–99)

## 2017-08-27 LAB — APTT: aPTT: 45 seconds — ABNORMAL HIGH (ref 24–36)

## 2017-08-27 MED ORDER — HEPARIN (PORCINE) IN NACL 100-0.45 UNIT/ML-% IJ SOLN
1150.0000 [IU]/h | INTRAMUSCULAR | Status: DC
  Administered 2017-08-27: 1000 [IU]/h via INTRAVENOUS
  Filled 2017-08-27: qty 250

## 2017-08-27 MED ORDER — SODIUM CHLORIDE 0.9 % IV SOLN
INTRAVENOUS | Status: DC
  Administered 2017-08-28 – 2017-08-29 (×3): via INTRAVENOUS

## 2017-08-27 MED ORDER — INSULIN ASPART 100 UNIT/ML ~~LOC~~ SOLN: 0.0000 [IU] | Freq: Every day | SUBCUTANEOUS | Status: DC

## 2017-08-27 MED ORDER — SODIUM CHLORIDE 0.9 % IV SOLN
Freq: Once | INTRAVENOUS | Status: AC
  Administered 2017-08-27: 19:00:00 via INTRAVENOUS

## 2017-08-27 MED ORDER — SERTRALINE HCL 50 MG PO TABS
25.0000 mg | ORAL_TABLET | Freq: Every day | ORAL | Status: DC
  Administered 2017-08-28 – 2017-08-29 (×2): 25 mg via ORAL
  Filled 2017-08-27 (×2): qty 1

## 2017-08-27 MED ORDER — PANTOPRAZOLE SODIUM 40 MG PO TBEC
40.0000 mg | DELAYED_RELEASE_TABLET | Freq: Every day | ORAL | Status: DC
  Administered 2017-08-28 – 2017-08-29 (×2): 40 mg via ORAL
  Filled 2017-08-27 (×2): qty 1

## 2017-08-27 MED ORDER — INSULIN ASPART 100 UNIT/ML ~~LOC~~ SOLN
0.0000 [IU] | Freq: Three times a day (TID) | SUBCUTANEOUS | Status: DC
Start: 2017-08-28 — End: 2017-08-29
  Administered 2017-08-28 – 2017-08-29 (×4): 1 [IU] via SUBCUTANEOUS
  Filled 2017-08-27 (×5): qty 1

## 2017-08-27 MED ORDER — DOCUSATE SODIUM 100 MG PO CAPS: 100.0000 mg | ORAL_CAPSULE | Freq: Two times a day (BID) | ORAL | Status: DC | PRN

## 2017-08-27 MED ORDER — ATORVASTATIN CALCIUM 20 MG PO TABS
40.0000 mg | ORAL_TABLET | Freq: Every day | ORAL | Status: DC
  Administered 2017-08-27 – 2017-08-28 (×2): 40 mg via ORAL
  Filled 2017-08-27 (×3): qty 2

## 2017-08-27 MED ORDER — LEVOTHYROXINE SODIUM 112 MCG PO TABS
112.0000 ug | ORAL_TABLET | Freq: Every day | ORAL | Status: DC
  Administered 2017-08-28 – 2017-08-29 (×2): 112 ug via ORAL
  Filled 2017-08-27 (×2): qty 1

## 2017-08-27 MED ORDER — HEPARIN BOLUS VIA INFUSION
4000.0000 [IU] | Freq: Once | INTRAVENOUS | Status: AC
  Administered 2017-08-27: 4000 [IU] via INTRAVENOUS
  Filled 2017-08-27: qty 4000

## 2017-08-27 NOTE — H&P (Signed)
[facility] NAME: Brian Casey    MR#:  [id number]  DATE OF BIRTH:  [birth year]  DATE OF ADMISSION:  08/27/2017  PRIMARY CARE PHYSICIAN: [name], MD   REQUESTING/REFERRING PHYSICIAN: Archie Balboa  CHIEF COMPLAINT:   Chief Complaint  Patient presents with  . Shortness of Breath    HISTORY OF PRESENT ILLNESS: Brian Casey  is a 67 y.o. male with a known history of enal failure, anxiety, depression, gastroesophageal reflux disease, cholesterol, hypertension, hyperthyroidism, atrial fibrillation, coronary artery disease, diabetes, CABG- takes eliquis at home and last dose was today morning. Recently started on chlorthalidone by his cardiologist at [facility] 2 weeks ago. He took it, but started to have cough, so stopped it a few days ago. His cough is dry, no associated fever or chills. Due to persistent cough, he started having pain in the left side of the chest and back so he went to his primary care doctor's office today. They did the x-ray and noticed some pleural effusion so suggested him to go to emergency room today. In ER, noted to have moderate pleural effusion but also noted acute renal renal failure and hyponatremia so given to hospitalist team after starting on IV fluids.  PAST MEDICAL HISTORY:   Past Medical History:  Diagnosis Date  . Acute renal failure ([place]) 11/06/2015   [name] 11/06/2015  . Anxiety   . Arthritis    "lower spine; fingers" (11/07/2015)  . Depression   . GERD (gastroesophageal reflux disease)   . High cholesterol   . History of bronchitis   . Hypertension 10/17/2013  . Hyperthyroidism    "had it radiated in his '52s"  . Hypothyroidism   . New onset atrial fibrillation ([place]) 11/06/2015   [name] 11/07/2015  . NSTEMI (non-ST elevated myocardial infarction) ([place]) 11/07/2015  . Thyroid disease   . Type II diabetes mellitus ([place])    type II  . Wears glasses     PAST SURGICAL HISTORY:  Past Surgical History:   Procedure Laterality Date  . CARDIAC CATHETERIZATION Right 11/06/2015   Procedure: Left Heart Cath and Coronary Angiography;  Surgeon: Minna Merritts, MD;  Location: [place] [facility];  Service: Cardiovascular;  Laterality: Right;  . CHOLECYSTECTOMY N/A 01/17/2016   Procedure: LAPAROSCOPIC CHOLECYSTECTOMY WITH INTRAOPERATIVE CHOLANGIOGRAM;  Surgeon: Fanny Skates, MD;  Location: [place];  Service: General;  Laterality: N/A;  . COLONOSCOPY    . CORONARY ARTERY BYPASS GRAFT N/A 11/14/2015   Procedure: CORONARY ARTERY BYPASS GRAFTING (CABG) x 4;  Surgeon: Ivin Poot, MD;  Location: [place];  Service: Open Heart Surgery;  Laterality: N/A;  . ENDARTERECTOMY Left 11/14/2015   Procedure: LEFT ENDARTERECTOMY CAROTID;  Surgeon: Waynetta Sandy, MD;  Location: [place];  Service: Vascular;  Laterality: Left;  . FINGER GANGLION CYST EXCISION Left X 3   "index finger X 2; thumb X 1"  . [place]  . HERNIA REPAIR    . INSERTION OF MESH N/A 11/16/2013   Procedure: INSERTION OF MESH;  Surgeon: Imogene Burn. Georgette Dover, MD;  Location: [place];  Service: General;  Laterality: N/A;  . IR GENERIC HISTORICAL  11/08/2015   IR PERC CHOLECYSTOSTOMY 11/08/2015 Corrie Mckusick, DO [facility]  . IR GENERIC HISTORICAL  12/13/2015   IR CHOLANGIOGRAM EXISTING TUBE 12/13/2015 [facility]  . IR GENERIC HISTORICAL  12/25/2015   IR CATHETER TUBE CHANGE 12/25/2015 Sandi Mariscal, MD [facility]  . IR GENERIC HISTORICAL  12/12/2015  IR RADIOLOGIST EVAL & MGMT 12/12/2015 Greggory Keen, MD [facility]  . TEE WITHOUT CARDIOVERSION N/A 11/14/2015   Procedure: TRANSESOPHAGEAL ECHOCARDIOGRAM (TEE);  Surgeon: Ivin Poot, MD;  Location: [place];  Service: Open Heart Surgery;  Laterality: N/A;  . UMBILICAL HERNIA REPAIR N/A 11/16/2013   Procedure: UMBILICAL HERNIA REPAIR;  Surgeon: Imogene Burn. Georgette Dover, MD;  Location: [place];  Service: General;  Laterality: N/A;  . VEIN HARVEST Right 11/14/2015   Procedure: RIGHT LEG  GREATER SAPHENOUS VEIN HARVEST;  Surgeon: Ivin Poot, MD;  Location: [place];  Service: Open Heart Surgery;  Laterality: Right;    SOCIAL HISTORY:  Social History   Tobacco Use  . Smoking status: Former Smoker    Packs/day: 0.12    Years: 48.00    Pack years: 5.76    Types: Cigarettes  . Smokeless tobacco: Never Used  . Tobacco comment: quit smoking 11/01/15  Substance Use Topics  . Alcohol use: Yes    Alcohol/week: 1.8 oz    Types: 3 Cans of beer per week    Comment: daily    FAMILY HISTORY:  Family History  Problem Relation Age of Onset  . Cancer Mother        colon  . Hypertension Mother   . Diabetes Mother   . Depression Mother   . Colon cancer Mother   . Hyperthyroidism Father   . Hypertension Father   . Cancer Father        prostate  . Diabetes Maternal Grandmother   . Cancer Maternal Grandmother        lung, non smoker    DRUG ALLERGIES:  Allergies  Allergen Reactions  . Penicillins Nausea And Vomiting and Other (See Comments)    REACTION: Vomiting Has patient had a PCN reaction causing immediate rash, facial/tongue/throat swelling, SOB or lightheadedness with hypotension: Yes Has patient had a PCN reaction causing severe rash involving mucus membranes or skin necrosis: No Has patient had a PCN reaction that required hospitalization No Has patient had a PCN reaction occurring within the last 10 years: No If all of the above answers are "NO", then may proceed with Cephalosporin use.  . Bupropion Hcl     REACTION: Severe constipation, insomnia    REVIEW OF SYSTEMS:   CONSTITUTIONAL: No fever,have fatigue or weakness.  EYES: No blurred or double vision.  EARS, NOSE, AND THROAT: No tinnitus or ear pain.  RESPIRATORY: dry cough,no shortness of breath, wheezing or hemoptysis.  CARDIOVASCULAR: No chest pain, orthopnea, edema.  GASTROINTESTINAL: No nausea, vomiting, diarrhea or abdominal pain.  GENITOURINARY: No dysuria, hematuria.  ENDOCRINE: No  polyuria, nocturia,  HEMATOLOGY: No anemia, easy bruising or bleeding SKIN: No rash or lesion. MUSCULOSKELETAL: No joint pain or arthritis.   NEUROLOGIC: No tingling, numbness, weakness.  PSYCHIATRY: No anxiety or depression.   MEDICATIONS AT HOME:  Prior to Admission medications   Medication Sig Start Date End Date Taking? Authorizing Provider  apixaban (ELIQUIS) 5 MG TABS tablet Take 1 tablet (5 mg total) by mouth 2 (two) times daily. 04/03/16  Yes Gollan, Kathlene November, MD  atorvastatin (LIPITOR) 40 MG tablet Take 40 mg by mouth daily.   Yes [provider]  carvedilol (COREG) 12.5 MG tablet Take 1 tablet by mouth 2 (two) times daily.   Yes [provider]  furosemide (LASIX) 20 MG tablet Take 1 tablet by mouth daily.   Yes [provider]  levothyroxine (SYNTHROID, LEVOTHROID) 100 MCG tablet Take 1 tablet (100  mcg total) by mouth daily before breakfast. OFFICE VISIT WITH  LABS REQUIRED FOR ADDITIONAL REFILLS Patient taking differently: Take 112 mcg by mouth daily before breakfast. OFFICE VISIT WITH  LABS REQUIRED FOR ADDITIONAL REFILLS 10/07/15  Yes Lucille Passy, MD  lisinopril (PRINIVIL,ZESTRIL) 40 MG tablet Take 1 tablet by mouth daily.   Yes [provider]  metFORMIN (GLUCOPHAGE) 500 MG tablet Take 500 mg by mouth 2 (two) times daily with a meal.   Yes [provider]  omeprazole (PRILOSEC) 20 MG capsule Take 1 capsule (20 mg total) by mouth daily. OFFICE VISIT REQUIRED FOR ADDITIONAL REFILLS 10/07/15  Yes Lucille Passy, MD  potassium chloride (K-DUR) 10 MEQ tablet Take 1 tablet by mouth daily.   Yes [provider]  sertraline (ZOLOFT) 25 MG tablet Take 1 tablet by mouth daily. 11/05/16 11/05/17 Yes [provider]  escitalopram (LEXAPRO) 10 MG tablet Take 1 tablet (10 mg total) by mouth daily. Patient not taking: Reported on 08/27/2017 11/07/15   Bettey Costa, MD  metoprolol tartrate (LOPRESSOR) 25 MG tablet TAKE 1/2 OF A TABLET  (12.5 MG TOTAL) BY MOUTH 2 (TWO) TIMES DAILY. Patient not taking: Reported on 08/27/2017 05/18/16   Minna Merritts, MD      PHYSICAL EXAMINATION:   VITAL SIGNS: Blood pressure (!) 102/53, pulse 60, resp. rate 17, height _0  (1.702 m), weight 93 kg (205 lb), SpO2 98 %.  GENERAL:  67 y.o.-year-old patient lying in the bed with no acute distress.  EYES: Pupils equal, round, reactive to light and accommodation. No scleral icterus. Extraocular muscles intact.  HEENT: Head atraumatic, normocephalic. Oropharynx and nasopharynx clear.  NECK:  Supple, no jugular venous distention. No thyroid enlargement, no tenderness.  LUNGS: decreased breath sounds left side, no wheezing, rales,rhonchi or crepitation. No use of accessory muscles of respiration.  CARDIOVASCULAR: S1, S2 normal. No murmurs, rubs, or gallops.  ABDOMEN: Soft, nontender, nondistended. Bowel sounds present. No organomegaly or mass.  EXTREMITIES: No pedal edema, cyanosis, or clubbing.  NEUROLOGIC: Cranial nerves II through XII are intact. Muscle strength 5/5 in all extremities. Sensation intact. Gait not checked.  PSYCHIATRIC: The patient is alert and oriented x 3.  SKIN: No obvious rash, lesion, or ulcer.   LABORATORY PANEL:   CBC Recent Labs  Lab 08/27/17 1556  WBC 9.7  HGB 12.6*  HCT 36.2*  PLT 324  MCV 96.8  MCH 33.8  MCHC 34.9  RDW 12.8  LYMPHSABS 0.9*  MONOABS 0.8  EOSABS 0.4  BASOSABS 0.0   ------------------------------------------------------------------------------------------------------------------  Chemistries  Recent Labs  Lab 08/27/17 1556  NA 125*  K 5.1  CL 91*  CO2 23  GLUCOSE 142*  BUN 18  CREATININE 1.76*  CALCIUM 8.4*  AST 19  ALT 17  ALKPHOS 56  BILITOT 1.0   ------------------------------------------------------------------------------------------------------------------ estimated creatinine clearance is 45.5 mL/min (A) (by C-G formula based on SCr of 1.76 mg/dL  (H)). ------------------------------------------------------------------------------------------------------------------ No results for input(s): TSH, T4TOTAL, T3FREE, THYROIDAB in the last 72 hours.  Invalid input(s): FREET3   Coagulation profile No results for input(s): INR, PROTIME in the last 168 hours. ------------------------------------------------------------------------------------------------------------------- No results for input(s): DDIMER in the last 72 hours. -------------------------------------------------------------------------------------------------------------------  Cardiac Enzymes No results for input(s): CKMB, TROPONINI, MYOGLOBIN in the last 168 hours.  Invalid input(s): CK ------------------------------------------------------------------------------------------------------------------ Invalid input(s): POCBNP  ---------------------------------------------------------------------------------------------------------------  Urinalysis    Component Value Date/Time   COLORURINE YELLOW 11/08/2015 [place] 11/08/2015 1959   LABSPEC 1.019 11/08/2015 1959  PHURINE 6.5 11/08/2015 1959   GLUCOSEU 100 (A) 11/08/2015 1959   HGBUR NEGATIVE 11/08/2015 1959   BILIRUBINUR NEGATIVE 11/08/2015 1959   KETONESUR 15 (A) 11/08/2015 1959   PROTEINUR NEGATIVE 11/08/2015 1959   NITRITE NEGATIVE 11/08/2015 1959   LEUKOCYTESUR NEGATIVE 11/08/2015 1959     RADIOLOGY: Dg Chest 2 View  Result Date: 08/27/2017 CLINICAL DATA:  Pleural fluid EXAM: CHEST - 2 VIEW COMPARISON:  12/11/15 FINDINGS: Insert my wires overlie normal cardiac silhouette. There is a new moderate size LEFT pleural effusion layering dependently in the LEFT hemithorax. Mild central venous congestion. No pneumothorax. No focal consolidation. IMPRESSION: Moderate LEFT pleural effusion new from prior. Electronically Signed   By: Suzy Bouchard M.D.   On: 08/27/2017 16:39    EKG: Orders placed  or performed during the hospital encounter of 08/27/17  . ED EKG  . ED EKG  . EKG 12-Lead  . EKG 12-Lead  . ED EKG  . ED EKG    IMPRESSION AND PLAN:  * Acute renal failure   Due to diuretics use   IV fluids and hold diuretics.   Hold nephrotoxic medications. ( lisinopril, metformin)  * Hyponatremia   Due to Diuretics   IV fluids.  * left-sided pleural effusion   Patient does not have symptoms of pneumonia.   I will check pro calcitonin.   Check CT scan of the chest.  * Atrial fibriltion   On oral anticoagulant, last dose today morning.   He may need pleural effusion tap, I will hold oral and start on Lovenox subcutaneous injections.   After 2 days, we may help to call IR to help for drainage.  he is running borderline blood pressure and normal heart rate, will hold beta blockers.  * hypertension   hold lisinopril and Lasix.   Hold Coreg -borderline blood pressure and normal to low heart rate  * Hypothyroidism   Continue levothyroxine.  * diabetes   We'll hold metformin keep on sliding scale coverage.  All the records are reviewed and case discussed with ED provider. Management plans discussed with the patient, family and they are in agreement.  CODE STATUS: full. Code Status History    Date Active Date Inactive Code Status Order ID Comments User Context   11/07/2015 1553 11/14/2015 1607 Full Code [id number]  Erma Heritage, [place] Inpatient   11/04/2015 1206 11/04/2015 1701 Full Code [id number]  Bettey Costa, MD ED       TOTAL TIME TAKING CARE OF THIS PATIENT: 50 minutes.  Spoke to His wife and sister in room.  [name] M.D on 08/27/2017   Between 7am to 6pm - Pager - [phone]  After 6pm go to [email or web address] - password [email or web address] [facility]  Office  [phone]  CC: Primary care physician; [name], MD   Note: This dictation was prepared with Dragon dictation along with smaller phrase technology. Any transcriptional  errors that result from this process are unintentional.

## 2017-08-27 NOTE — ED Notes (Signed)
ED Provider at bedside. 

## 2017-08-27 NOTE — ED Notes (Signed)
Pt back and forth from sinus brady to ventricular bigeminy. EKG captured to reflect change. MD aware.

## 2017-08-27 NOTE — ED Triage Notes (Signed)
Pt was sent to ED from [facility] - he states that he had xray and the results showed that pt needed "fluid pulled off lungs"

## 2017-08-27 NOTE — ED Notes (Signed)
Pt reports shortness of breath and non-productive cough that began Monday. Pt currently denies shortness of breath, but breaths are shallow at this time.

## 2017-08-27 NOTE — ED Notes (Signed)
First Nurse Note: Pt to ED via POV, pt ambulatory to front desk stating that he is here for vomiting. Pt wife states that pt was send over by his PCP to have fluid drained from his lungs.

## 2017-08-27 NOTE — Progress Notes (Signed)
ANTICOAGULATION CONSULT NOTE - Initial Consult  Pharmacy Consult for heparin Indication: atrial fibrillation   Allergies  Allergen Reactions  . Penicillins Nausea And Vomiting and Other (See Comments)    REACTION: Vomiting Has patient had a PCN reaction causing immediate rash, facial/tongue/throat swelling, SOB or lightheadedness with hypotension: Yes Has patient had a PCN reaction causing severe rash involving mucus membranes or skin necrosis: No Has patient had a PCN reaction that required hospitalization No Has patient had a PCN reaction occurring within the last 10 years: No If all of the above answers are "NO", then may proceed with Cephalosporin use.  . Bupropion Hcl     REACTION: Severe constipation, insomnia    Patient Measurements: Height: _0  (170.2 cm) Weight: 204 lb 2.3 oz (92.6 kg) IBW/kg (Calculated) : 66.1 Heparin Dosing Weight:86 kg  Vital Signs: Temp: 97.7 F (36.5 C) (06/07 2134) Temp Source: Oral (06/07 2134) BP: 164/67 (06/07 2134) Pulse Rate: 59 (06/07 2134)  Labs: Recent Labs    08/27/17 1556  HGB 12.6*  HCT 36.2*  PLT 324  CREATININE 1.76*    Estimated Creatinine Clearance: 45.4 mL/min (A) (by C-G formula based on SCr of 1.76 mg/dL (H)).   Medical History: Past Medical History:  Diagnosis Date  . Acute renal failure ([place]) 11/06/2015   [name] 11/06/2015  . Anxiety   . Arthritis    "lower spine; fingers" (11/07/2015)  . Depression   . GERD (gastroesophageal reflux disease)   . High cholesterol   . History of bronchitis   . Hypertension 10/17/2013  . Hyperthyroidism    "had it radiated in his '54s"  . Hypothyroidism   . New onset atrial fibrillation ([place]) 11/06/2015   [name] 11/07/2015  . NSTEMI (non-ST elevated myocardial infarction) ([place]) 11/07/2015  . Thyroid disease   . Type II diabetes mellitus ([place])    type II  . Wears glasses     Medications:  Scheduled:  . atorvastatin  40 mg Oral Daily  . heparin  4,000 Units  Intravenous Once  . insulin aspart  0-5 Units Subcutaneous QHS  . [START ON 08/28/2017] insulin aspart  0-9 Units Subcutaneous TID WC  . [START ON 08/28/2017] levothyroxine  112 mcg Oral QAC breakfast  . pantoprazole  40 mg Oral Daily  . sertraline  25 mg Oral Daily    Assessment: Patient admitted for SOB found to have pleural effusion and is going in for possible thoracentesis and is on eliquis PTA for afib. Due to procedure holding eliquis and start heparin for anticoagulation for afib and will hold for 6 hours pre-procedure.  Goal of Therapy:  Heparin level 0.3-0.7 units/ml Monitor platelets by anticoagulation protocol: Yes   Plan:  Will bolus heparin 4000 units IV x 1 Will start rate at 1000 units/hr  Will draw baseline labs Will draw anti-Xa @ 0500 Will monitor daily CBC's and adjust per anti-Xa's  Tobie Lords, PharmD, BCPS Clinical Pharmacist 08/27/2017

## 2017-08-27 NOTE — ED Provider Notes (Signed)
[facility] Emergency Department Provider Note   ____________________________________________   I have reviewed the triage vital signs and the nursing notes.   HISTORY  Chief Complaint Shortness of Breath   History limited by: Not Limited   HPI Brian Casey is a 67 y.o. male who presents to the emergency department today at the request of primary care provider because of concern for left pleural effusion seen on x-ray today.  The patient states that he has had a cough for the past few weeks.  He has associated weakness.  He has had a left sided back pain.  Patient denies any fevers.  Family stated that he has had pleural effusions in the past.   Per medical record review patient has a history of AKI, nstemi.   Past Medical History:  Diagnosis Date  . Acute renal failure ([place]) 11/06/2015   [name] 11/06/2015  . Anxiety   . Arthritis    "lower spine; fingers" (11/07/2015)  . Depression   . GERD (gastroesophageal reflux disease)   . High cholesterol   . History of bronchitis   . Hypertension 10/17/2013  . Hyperthyroidism    "had it radiated in his '39s"  . Hypothyroidism   . New onset atrial fibrillation ([place]) 11/06/2015   [name] 11/07/2015  . NSTEMI (non-ST elevated myocardial infarction) ([place]) 11/07/2015  . Thyroid disease   . Type II diabetes mellitus ([place])    type II  . Wears glasses     Patient Active Problem List   Diagnosis Date Noted  . 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  . Bilateral carotid artery disease ([place])   . Carotid stenosis 11/09/2015  . CKD (chronic kidney disease), stage II 11/09/2015  . Protein-calorie malnutrition, severe ([place]) 11/09/2015  . Sepsis due to Gram negative bacteria ([place]) 11/09/2015  . Alcohol abuse 11/09/2015  . Left ventricular diastolic dysfunction/grade 2 11/09/2015  . Situational depression 11/06/2015  . Generalized anxiety disorder 11/06/2015  . Pre-op evaluation   . NSTEMI (non-ST elevated myocardial  infarction) ([place])   . Coronary artery disease involving native coronary artery of native heart with angina pectoris ([place])   . Acute cholecystitis without calculus 11/04/2015  . PAF (paroxysmal atrial fibrillation) ([place]) 11/04/2015  . AKI (acute kidney injury) ([place])   . Elevated troponin I level   . Coronary artery disease involving native coronary artery of native heart with angina pectoris with documented spasm ([place])   . Aortic atherosclerosis ([place])   . Visit for well man health check 11/06/2014  . Counseling on health promotion and disease prevention 03/29/2014  . Umbilical hernia 81/85/6314  . 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 Essential hypertension 10/17/2013  . Weight gain 10/11/2013  . Swelling of right knee joint 10/11/2013  . Puncture wound 08/18/2013  . Left low back pain 04/13/2013  . HLD (hyperlipidemia) 01/26/2011  . Anxiety state 12/26/2008  . Hypothyroidism 03/02/2007  . Diabetes mellitus type 2, uncontrolled ([place]) 03/02/2007  . NICOTINE ADDICTION 03/02/2007  . DEPRESSION/ ANGER CONTROL 03/02/2007    Past Surgical History:  Procedure Laterality Date  . CARDIAC CATHETERIZATION Right 11/06/2015   Procedure: Left Heart Cath and Coronary Angiography;  Surgeon: Minna Merritts, MD;  Location: [place] [facility];  Service: Cardiovascular;  Laterality: Right;  . CHOLECYSTECTOMY N/A 01/17/2016   Procedure: LAPAROSCOPIC CHOLECYSTECTOMY WITH INTRAOPERATIVE CHOLANGIOGRAM;  Surgeon: Fanny Skates, MD;  Location: [place];  Service: General;  Laterality: N/A;  . COLONOSCOPY    . CORONARY ARTERY BYPASS GRAFT N/A 11/14/2015  Procedure: CORONARY ARTERY BYPASS GRAFTING (CABG) x 4;  Surgeon: Ivin Poot, MD;  Location: [place];  Service: Open Heart Surgery;  Laterality: N/A;  . ENDARTERECTOMY Left 11/14/2015   Procedure: LEFT ENDARTERECTOMY CAROTID;  Surgeon: Waynetta Sandy, MD;  Location: [place];  Service: Vascular;  Laterality: Left;  . FINGER GANGLION CYST EXCISION Left X 3    "index finger X 2; thumb X 1"  . [place]  . HERNIA REPAIR    . INSERTION OF MESH N/A 11/16/2013   Procedure: INSERTION OF MESH;  Surgeon: Imogene Burn. Georgette Dover, MD;  Location: [place];  Service: General;  Laterality: N/A;  . IR GENERIC HISTORICAL  11/08/2015   IR PERC CHOLECYSTOSTOMY 11/08/2015 Corrie Mckusick, DO [facility]  . IR GENERIC HISTORICAL  12/13/2015   IR CHOLANGIOGRAM EXISTING TUBE 12/13/2015 [facility]  . IR GENERIC HISTORICAL  12/25/2015   IR CATHETER TUBE CHANGE 12/25/2015 Sandi Mariscal, MD [facility]  . IR GENERIC HISTORICAL  12/12/2015   IR RADIOLOGIST EVAL & MGMT 12/12/2015 Greggory Keen, MD [facility]  . TEE WITHOUT CARDIOVERSION N/A 11/14/2015   Procedure: TRANSESOPHAGEAL ECHOCARDIOGRAM (TEE);  Surgeon: Ivin Poot, MD;  Location: [place];  Service: Open Heart Surgery;  Laterality: N/A;  . UMBILICAL HERNIA REPAIR N/A 11/16/2013   Procedure: UMBILICAL HERNIA REPAIR;  Surgeon: Imogene Burn. Georgette Dover, MD;  Location: [place];  Service: General;  Laterality: N/A;  . VEIN HARVEST Right 11/14/2015   Procedure: RIGHT LEG GREATER SAPHENOUS VEIN HARVEST;  Surgeon: Ivin Poot, MD;  Location: [place];  Service: Open Heart Surgery;  Laterality: Right;    Prior to Admission medications   Medication Sig Start Date End Date Taking? Authorizing Provider  apixaban (ELIQUIS) 5 MG TABS tablet Take 1 tablet (5 mg total) by mouth 2 (two) times daily. 04/03/16   Minna Merritts, MD  atorvastatin (LIPITOR) 40 MG tablet Take 40 mg by mouth daily.    [provider]  escitalopram (LEXAPRO) 10 MG tablet Take 1 tablet (10 mg total) by mouth daily. 11/07/15   Bettey Costa, MD  levothyroxine (SYNTHROID, LEVOTHROID) 100 MCG tablet Take 1 tablet (100 mcg total) by mouth daily before breakfast. OFFICE VISIT WITH  LABS REQUIRED FOR ADDITIONAL REFILLS 10/07/15   Lucille Passy, MD  metFORMIN (GLUCOPHAGE) 500 MG tablet Take 500 mg by mouth 2 (two) times daily with a meal.    [provider]  metoprolol tartrate (LOPRESSOR) 25 MG tablet TAKE 1/2 OF A TABLET (12.5 MG TOTAL) BY MOUTH 2 (TWO) TIMES DAILY. 05/18/16   Minna Merritts, MD  omeprazole (PRILOSEC) 20 MG capsule Take 1 capsule (20 mg total) by mouth daily. OFFICE VISIT REQUIRED FOR ADDITIONAL REFILLS 10/07/15   Lucille Passy, MD    Allergies Penicillins and Bupropion hcl  Family History  Problem Relation Age of Onset  . Cancer Mother        colon  . Hypertension Mother   . Diabetes Mother   . Depression Mother   . Colon cancer Mother   . Hyperthyroidism Father   . Hypertension Father   . Cancer Father        prostate  . Diabetes Maternal Grandmother   . Cancer Maternal Grandmother        lung, non smoker    Social History Social History   Tobacco Use  . Smoking status: Former Smoker    Packs/day: 0.12    Years: 48.00  Pack years: 5.76    Types: Cigarettes  . Smokeless tobacco: Never Used  . Tobacco comment: quit smoking 11/01/15  Substance Use Topics  . Alcohol use: Yes    Alcohol/week: 1.8 oz    Types: 3 Cans of beer per week    Comment: daily  . Drug use: No    Review of Systems Constitutional: No fever/chills. Positive for generalized weakness.  Eyes: No visual changes. ENT: No sore throat. Cardiovascular: Denies chest pain. Respiratory: Positive for shortness of breath.  Gastrointestinal: No abdominal pain.  No nausea, no vomiting.  No diarrhea.   Genitourinary: Negative for dysuria. Musculoskeletal: Positive for back pain. Skin: Negative for rash. Neurological: Negative for headaches, focal weakness or numbness.  ____________________________________________   PHYSICAL EXAM:  VITAL SIGNS: ED Triage Vitals  Enc Vitals Group     BP 08/27/17 1553 (!) 147/85     Pulse Rate 08/27/17 1553 61     Resp 08/27/17 1553 18     Temp --      Temp src --      SpO2 08/27/17 1552 97 %     Weight 08/27/17 1554 205 lb (93 kg)     Height 08/27/17 1554 _0  (1.702 m)     Head  Circumference --      Peak Flow --      Pain Score 08/27/17 1553 7   Constitutional: Alert and oriented.  Eyes: Conjunctivae are normal.  ENT      Head: Normocephalic and atraumatic.      Nose: No congestion/rhinnorhea.      Mouth/Throat: Mucous membranes are moist.      Neck: No stridor. Hematological/Lymphatic/Immunilogical: No cervical lymphadenopathy. Cardiovascular: Normal rate, regular rhythm.  No murmurs, rubs, or gallops.  Respiratory: Normal respiratory effort without tachypnea nor retractions. Diminished breath sounds on the left lower lung.  Gastrointestinal: Soft and non tender. No rebound. No guarding.  Genitourinary: Deferred Musculoskeletal: Normal range of motion in all extremities. No lower extremity edema. Neurologic:  Normal speech and language. No gross focal neurologic deficits are appreciated.  Skin:  Skin is warm, dry and intact. No rash noted. Psychiatric: Mood and affect are normal. Speech and behavior are normal. Patient exhibits appropriate insight and judgment.  ____________________________________________    LABS (pertinent positives/negatives)  CBC wbc 9.7, hgb 12.6, plt 324 CMP na 125, cl 91, k 5.1, glu 142, cr 1.76  ____________________________________________   EKG  I, Nance Pear, attending physician, personally viewed and interpreted this EKG  EKG Time: 1611 Rate: 62 Rhythm: normal sinus rhythm Axis: normal Intervals: qtc 452 QRS: narrow, q waves v1, v2, v3 ST changes: no st elevation Impression: abnormal ekg  I, Nance Pear, attending physician, personally viewed and interpreted this EKG  EKG Time: 1742 Rate: 54 Rhythm: sinus rhythm with ventricular bigeminy Axis: normal Intervals: qtc 410 QRS: q waves v1 ST changes: no st elevation Impression: abnormal ekg  ____________________________________________    RADIOLOGY  CXR Left pleural  effusion  ____________________________________________   PROCEDURES  Procedures  ____________________________________________   INITIAL IMPRESSION / ASSESSMENT AND PLAN / ED COURSE  Pertinent labs & imaging results that were available during my care of the patient were reviewed by me and considered in my medical decision making (see chart for details).   Patient presented to the emergency department today at the request of primary care provider because of pleural effusion seen on x-ray.  Patient did have moderate left-sided pleural effusion.  Additionally sodium level is low at  125.  It does not appear the patient has history of low sodium.  Patient states that he does get most of his primary care at the [place].  When discussing admission he states however that he can be admitted to other facilities.  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  Will plan on admission to our facility.  Will start IV fluids.   ____________________________________________   FINAL CLINICAL IMPRESSION(S) / ED DIAGNOSES  Final diagnoses:  Pleural effusion  Hyponatremia     Note: This dictation was prepared with Dragon dictation. Any transcriptional errors that result from this process are unintentional     Nance Pear, MD 08/27/17 1814

## 2017-08-28 ENCOUNTER — Inpatient Hospital Stay: Payer: No Typology Code available for payment source

## 2017-08-28 LAB — CBC
HEMATOCRIT: 34.5 % — AB (ref 40.0–52.0)
Hemoglobin: 12 g/dL — ABNORMAL LOW (ref 13.0–18.0)
MCH: 33.7 pg (ref 26.0–34.0)
MCHC: 34.9 g/dL (ref 32.0–36.0)
MCV: 96.5 fL (ref 80.0–100.0)
Platelets: 273 10*3/uL (ref 150–440)
RBC: 3.58 MIL/uL — AB (ref 4.40–5.90)
RDW: 12.9 % (ref 11.5–14.5)
WBC: 6.8 10*3/uL (ref 3.8–10.6)

## 2017-08-28 LAB — BASIC METABOLIC PANEL
ANION GAP: 11 (ref 5–15)
BUN: 18 mg/dL (ref 6–20)
CHLORIDE: 95 mmol/L — AB (ref 101–111)
CO2: 22 mmol/L (ref 22–32)
Calcium: 7.7 mg/dL — ABNORMAL LOW (ref 8.9–10.3)
Creatinine, Ser: 1.38 mg/dL — ABNORMAL HIGH (ref 0.61–1.24)
GFR, EST NON AFRICAN AMERICAN: 52 mL/min — AB (ref >60)
Glucose, Bld: 125 mg/dL — ABNORMAL HIGH (ref 65–99)
POTASSIUM: 3.7 mmol/L (ref 3.5–5.1)
SODIUM: 128 mmol/L — AB (ref 135–145)

## 2017-08-28 LAB — GLUCOSE, CAPILLARY
GLUCOSE-CAPILLARY: 131 mg/dL — AB (ref 65–99)
GLUCOSE-CAPILLARY: 141 mg/dL — AB (ref 65–99)
GLUCOSE-CAPILLARY: 155 mg/dL — AB (ref 65–99)
Glucose-Capillary: 127 mg/dL — ABNORMAL HIGH (ref 65–99)
Glucose-Capillary: 204 mg/dL — ABNORMAL HIGH (ref 65–99)

## 2017-08-28 LAB — BODY FLUID CELL COUNT WITH DIFFERENTIAL
EOS FL: 41 %
LYMPHS FL: 14 %
Monocyte-Macrophage-Serous Fluid: 15 %
Neutrophil Count, Fluid: 30 %
OTHER CELLS FL: 0 %
Total Nucleated Cell Count, Fluid: 1853 cu mm

## 2017-08-28 LAB — PROTEIN, PLEURAL OR PERITONEAL FLUID: TOTAL PROTEIN, FLUID: 4.2 g/dL

## 2017-08-28 LAB — GLUCOSE, PLEURAL OR PERITONEAL FLUID: GLUCOSE FL: 138 mg/dL

## 2017-08-28 LAB — AMYLASE, PLEURAL OR PERITONEAL FLUID: Amylase, Fluid: 12 U/L

## 2017-08-28 LAB — APTT: APTT: 59 s — AB (ref 24–36)

## 2017-08-28 LAB — PROTIME-INR
INR: 1.54
Prothrombin Time: 18.4 seconds — ABNORMAL HIGH (ref 11.4–15.2)

## 2017-08-28 LAB — LACTATE DEHYDROGENASE, PLEURAL OR PERITONEAL FLUID: LD FL: 579 U/L — AB (ref 3–23)

## 2017-08-28 LAB — PROCALCITONIN: Procalcitonin: <0.1 ng/mL

## 2017-08-28 LAB — ALBUMIN, PLEURAL OR PERITONEAL FLUID: ALBUMIN FL: 2.5 g/dL

## 2017-08-28 LAB — HEPARIN LEVEL (UNFRACTIONATED): Heparin Unfractionated: 3.34 IU/mL — ABNORMAL HIGH (ref 0.30–0.70)

## 2017-08-28 MED ORDER — HYDROCOD POLST-CPM POLST ER 10-8 MG/5ML PO SUER
5.0000 mL | Freq: Two times a day (BID) | ORAL | Status: DC
  Administered 2017-08-28 – 2017-08-29 (×3): 5 mL via ORAL
  Filled 2017-08-28 (×3): qty 5

## 2017-08-28 MED ORDER — METOPROLOL TARTRATE 25 MG PO TABS
12.5000 mg | ORAL_TABLET | Freq: Two times a day (BID) | ORAL | Status: DC
  Administered 2017-08-28 – 2017-08-29 (×3): 12.5 mg via ORAL
  Filled 2017-08-28 (×3): qty 1

## 2017-08-28 MED ORDER — CHOLESTYRAMINE 4 G PO PACK
4.0000 g | PACK | ORAL | Status: DC | PRN
  Administered 2017-08-28: 4 g via ORAL
  Filled 2017-08-28 (×2): qty 1

## 2017-08-28 MED ORDER — ACETAMINOPHEN 325 MG PO TABS: 650.0000 mg | ORAL_TABLET | Freq: Four times a day (QID) | ORAL | Status: DC | PRN

## 2017-08-28 MED ORDER — ENOXAPARIN SODIUM 40 MG/0.4ML ~~LOC~~ SOLN
40.0000 mg | SUBCUTANEOUS | Status: DC
  Administered 2017-08-28: 40 mg via SUBCUTANEOUS
  Filled 2017-08-28: qty 0.4

## 2017-08-28 MED ORDER — OXYCODONE-ACETAMINOPHEN 5-325 MG PO TABS: 1.0000 | ORAL_TABLET | ORAL | Status: DC | PRN

## 2017-08-28 NOTE — Procedures (Signed)
   US guided Left thoracentesis  925 cc bloody fluid Sent for labs per MD  Tolerated well  CXR pending

## 2017-08-28 NOTE — Care Management (Signed)
[facility] paper work faxed to AOD at [facility].  Along with completed consent to transfer.  Per MD patient will likely discharge tomorrow. James with [facility] has confirmed receipt of documents.

## 2017-08-28 NOTE — Progress Notes (Signed)
ANTICOAGULATION CONSULT NOTE - Initial Consult  Pharmacy Consult for heparin Indication: atrial fibrillation   Allergies  Allergen Reactions  . Penicillins Nausea And Vomiting and Other (See Comments)    REACTION: Vomiting Has patient had a PCN reaction causing immediate rash, facial/tongue/throat swelling, SOB or lightheadedness with hypotension: Yes Has patient had a PCN reaction causing severe rash involving mucus membranes or skin necrosis: No Has patient had a PCN reaction that required hospitalization No Has patient had a PCN reaction occurring within the last 10 years: No If all of the above answers are "NO", then may proceed with Cephalosporin use.  . Bupropion Hcl     REACTION: Severe constipation, insomnia    Patient Measurements: Height: _0  (170.2 cm) Weight: 204 lb 2.3 oz (92.6 kg) IBW/kg (Calculated) : 66.1 Heparin Dosing Weight:86 kg  Vital Signs: Temp: 97.5 F (36.4 C) (06/08 0538) Temp Source: Oral (06/08 0538) BP: 134/68 (06/08 0538) Pulse Rate: 64 (06/08 0538)  Labs: Recent Labs    08/27/17 1556 08/27/17 2241 08/28/17 0539  HGB 12.6*  --  12.0*  HCT 36.2*  --  34.5*  PLT 324  --  273  APTT  --  45* 59*  LABPROT  --  18.3* 18.4*  INR  --  1.53 1.54  HEPARINUNFRC  --   --  3.34*  CREATININE 1.76*  --  1.38*    Estimated Creatinine Clearance: 57.9 mL/min (A) (by C-G formula based on SCr of 1.38 mg/dL (H)).   Medical History: Past Medical History:  Diagnosis Date  . Acute renal failure ([place]) 11/06/2015   [name] 11/06/2015  . Anxiety   . Arthritis    "lower spine; fingers" (11/07/2015)  . Depression   . GERD (gastroesophageal reflux disease)   . High cholesterol   . History of bronchitis   . Hypertension 10/17/2013  . Hyperthyroidism    "had it radiated in his '71s"  . Hypothyroidism   . New onset atrial fibrillation ([place]) 11/06/2015   [name] 11/07/2015  . NSTEMI (non-ST elevated myocardial infarction) ([place]) 11/07/2015  . Thyroid  disease   . Type II diabetes mellitus ([place])    type II  . Wears glasses     Medications:  Scheduled:  . atorvastatin  40 mg Oral Daily  . insulin aspart  0-5 Units Subcutaneous QHS  . insulin aspart  0-9 Units Subcutaneous TID WC  . levothyroxine  112 mcg Oral QAC breakfast  . pantoprazole  40 mg Oral Daily  . sertraline  25 mg Oral Daily    Assessment: Patient admitted for SOB found to have pleural effusion and is going in for possible thoracentesis and is on eliquis PTA for afib.  Due to procedure holding eliquis and start heparin for anticoagulation for afib and will hold for 6 hours pre-procedure.  Goal of Therapy:  APTT: 66-102s Heparin level 0.3-0.7 units/ml Monitor platelets by anticoagulation protocol: Yes   Plan:  6/8 aPTT subtherapeutic at 59. HL elevated. Will need to adjust heparin drip based on aPTT tills aPTT and HL correlate.  Will increase heparin infusion to 1150u/hr and recheck aPTT in 6 hours. Plan to check HL and CBC with AM per protocol.   Pernell Dupre, PharmD, BCPS Clinical Pharmacist 08/28/2017 8:38 AM

## 2017-08-28 NOTE — Progress Notes (Signed)
[facility] at [place] NAME: Brian Casey    MR#:  [id number]  DATE OF BIRTH:  [birth year]  SUBJECTIVE:  CHIEF COMPLAINT:   Chief Complaint  Patient presents with  . Shortness of Breath   - admitted for cough and left sided chest pain - for thoracentesis today or tomorrow - took eliquis yesterday AM  REVIEW OF SYSTEMS:  Review of Systems  Constitutional: Negative for chills and fever.  HENT: Negative for congestion, ear discharge, hearing loss and nosebleeds.   Respiratory: Positive for cough. Negative for shortness of breath and wheezing.   Cardiovascular: Positive for chest pain. Negative for palpitations and leg swelling.  Gastrointestinal: Negative for abdominal pain, constipation, diarrhea, nausea and vomiting.  Genitourinary: Negative for dysuria and urgency.  Musculoskeletal: Positive for myalgias.  Neurological: Negative for dizziness, focal weakness, seizures, weakness and headaches.  Psychiatric/Behavioral: Negative for depression.    DRUG ALLERGIES:   Allergies  Allergen Reactions  . Penicillins Nausea And Vomiting and Other (See Comments)    REACTION: Vomiting Has patient had a PCN reaction causing immediate rash, facial/tongue/throat swelling, SOB or lightheadedness with hypotension: Yes Has patient had a PCN reaction causing severe rash involving mucus membranes or skin necrosis: No Has patient had a PCN reaction that required hospitalization No Has patient had a PCN reaction occurring within the last 10 years: No If all of the above answers are "NO", then may proceed with Cephalosporin use.  . Bupropion Hcl     REACTION: Severe constipation, insomnia    VITALS:  Blood pressure 134/68, pulse 64, temperature (!) 97.5 F (36.4 C), temperature source Oral, resp. rate 16, height _0  (1.702 m), weight 92.6 kg (204 lb 2.3 oz), SpO2 97 %.  PHYSICAL EXAMINATION:  Physical Exam  GENERAL:  67 y.o.-year-old patient  lying in the bed with no acute distress.  EYES: Pupils equal, round, reactive to light and accommodation. No scleral icterus. Extraocular muscles intact.  HEENT: Head atraumatic, normocephalic. Oropharynx and nasopharynx clear.  NECK:  Supple, no jugular venous distention. No thyroid enlargement, no tenderness.  LUNGS: Normal breath sounds bilaterally, no wheezing, rales,rhonchi or crepitation. Decreased left basilar breath sounds -No use of accessory muscles of respiration.  CARDIOVASCULAR: S1, S2 normal. No murmurs, rubs, or gallops.  ABDOMEN: Soft, nontender, nondistended. Bowel sounds present. No organomegaly or mass.  EXTREMITIES: No pedal edema, cyanosis, or clubbing.  NEUROLOGIC: Cranial nerves II through XII are intact. Muscle strength 5/5 in all extremities. Sensation intact. Gait not checked.  PSYCHIATRIC: The patient is alert and oriented x 3.  SKIN: No obvious rash, lesion, or ulcer.    LABORATORY PANEL:   CBC Recent Labs  Lab 08/28/17 0539  WBC 6.8  HGB 12.0*  HCT 34.5*  PLT 273   ------------------------------------------------------------------------------------------------------------------  Chemistries  Recent Labs  Lab 08/27/17 1556 08/28/17 0539  NA 125* 128*  K 5.1 3.7  CL 91* 95*  CO2 23 22  GLUCOSE 142* 125*  BUN 18 18  CREATININE 1.76* 1.38*  CALCIUM 8.4* 7.7*  AST 19  --   ALT 17  --   ALKPHOS 56  --   BILITOT 1.0  --    ------------------------------------------------------------------------------------------------------------------  Cardiac Enzymes No results for input(s): TROPONINI in the last 168 hours. ------------------------------------------------------------------------------------------------------------------  RADIOLOGY:  Dg Chest 2 View  Result Date: 08/27/2017 CLINICAL DATA:  Pleural fluid EXAM: CHEST - 2 VIEW COMPARISON:  12/11/15 FINDINGS: Insert my wires overlie normal cardiac silhouette. There is  a new moderate size LEFT  pleural effusion layering dependently in the LEFT hemithorax. Mild central venous congestion. No pneumothorax. No focal consolidation. IMPRESSION: Moderate LEFT pleural effusion new from prior. Electronically Signed   By: Suzy Bouchard M.D.   On: 08/27/2017 16:39   Ct Chest Wo Contrast  Result Date: 08/27/2017 CLINICAL DATA:  Patient presents due to concern of left pleural effusion on chest x-ray today. Cough for several weeks. Weakness. Left-sided back pain. EXAM: CT CHEST WITHOUT CONTRAST TECHNIQUE: Multidetector CT imaging of the chest was performed following the standard protocol without IV contrast. COMPARISON:  11/13/2015 and chest x-ray 08/27/2017 FINDINGS: Cardiovascular: Heart is normal in size. Evidence of previous median sternotomy/CABG. Mild calcified plaque over the thoracic aorta. Mediastinum/Nodes: No significant mediastinal or hilar adenopathy. Remaining mediastinal structures are unremarkable. Lungs/Pleura: Examination demonstrates a moderate size left pleural effusion with associated basilar atelectasis. Chronic interstitial changes and peripheral reticular changes within the right lung most of which are chronic, although the changes in the right base were obscured on the previous exam due to small effusion and atelectasis. It would be therefore difficult to exclude atypical infection or inflammatory process in the right base. Airways are unremarkable. Upper Abdomen: Previous cholecystectomy. Mild stranding of the right perinephric fat. Musculoskeletal: Minimal degenerate change of the spine. IMPRESSION: New moderate size left pleural effusion with associated basilar atelectasis. Chronic peripheral reticular/interstitial changes within the right lung. Subtle peripheral reticular changes over the right base likely chronic although it would be difficult to exclude atypical infectious or inflammatory process. Consider follow-up CT 6-8 weeks. Previous CABG. Aortic Atherosclerosis (ICD10-I70.0).  Electronically Signed   By: Marin Olp M.D.   On: 08/27/2017 21:10    EKG:   Orders placed or performed during the hospital encounter of 08/27/17  . ED EKG  . ED EKG  . EKG 12-Lead  . EKG 12-Lead  . ED EKG  . ED EKG    ASSESSMENT AND PLAN:   67 year old male with past medical history significant for CAD status post CABG, paroxysmal A. fib on Eliquis, hypertension, GERD, anxiety and depression was brought to hospital secondary to cough and chest x-ray consistent with pleural effusion  1.  Hyponatremia-secondary to dehydration, also was taking chlorthalidone at home. -Discontinue diuretics.  Gentle hydration being given -Improving sodium  2.  Acute renal failure-again prerenal causes, gentle hydration -Avoid nephrotoxins.  Improving creatinine  3.  Cough-secondary to left pleural effusion-unknown cause at this time -CT chest with no pneumonia, no tumors seen. -Has history of CAD, no known history of congestive heart failure. -Ultrasound guided thoracentesis requested.  Labs ordered -Cough medicines added. -Patient is stable on room air  4.  Diabetes mellitus-again hold metformin secondary to his renal failure.  Sliding scale insulin while in the hospital.  5.  Atrial fibrillation-rate controlled.  Restart low-dose metoprolol. -Anticoagulation on hold due to a procedure scheduled  6.  DVT prophylaxis-started on Lovenox while in the hospital    All the records are reviewed and case discussed with Care Management/Social Workerr. Management plans discussed with the patient, family and they are in agreement.  CODE STATUS: Full Code  TOTAL TIME TAKING CARE OF THIS PATIENT: 38 minutes.   POSSIBLE D/C IN 1-2 DAYS, DEPENDING ON CLINICAL CONDITION.   Gladstone Lighter M.D on 08/28/2017 at 12:06 PM  Between 7am to 6pm - Pager - [phone]  After 6pm go to [email or web address] - password [email or web address] [facility]  Office  [phone]  CC: Primary care  physician;  [name], MD

## 2017-08-29 ENCOUNTER — Inpatient Hospital Stay: Payer: No Typology Code available for payment source

## 2017-08-29 LAB — CBC
HCT: 34.5 % — ABNORMAL LOW (ref 40.0–52.0)
Hemoglobin: 12.2 g/dL — ABNORMAL LOW (ref 13.0–18.0)
MCH: 34.2 pg — ABNORMAL HIGH (ref 26.0–34.0)
MCHC: 35.4 g/dL (ref 32.0–36.0)
MCV: 96.4 fL (ref 80.0–100.0)
Platelets: 273 K/uL (ref 150–440)
RBC: 3.57 MIL/uL — ABNORMAL LOW (ref 4.40–5.90)
RDW: 12.9 % (ref 11.5–14.5)
WBC: 6.2 K/uL (ref 3.8–10.6)

## 2017-08-29 LAB — BASIC METABOLIC PANEL
Anion gap: 7 (ref 5–15)
BUN: 14 mg/dL (ref 6–20)
CALCIUM: 7.9 mg/dL — AB (ref 8.9–10.3)
CHLORIDE: 103 mmol/L (ref 101–111)
CO2: 22 mmol/L (ref 22–32)
CREATININE: 1.4 mg/dL — AB (ref 0.61–1.24)
GFR calc non Af Amer: 51 mL/min — ABNORMAL LOW (ref >60)
GFR, EST AFRICAN AMERICAN: 59 mL/min — AB (ref >60)
Glucose, Bld: 122 mg/dL — ABNORMAL HIGH (ref 65–99)
Potassium: 4.5 mmol/L (ref 3.5–5.1)
SODIUM: 132 mmol/L — AB (ref 135–145)

## 2017-08-29 LAB — GLUCOSE, CAPILLARY: Glucose-Capillary: 141 mg/dL — ABNORMAL HIGH (ref 65–99)

## 2017-08-29 LAB — ACID FAST SMEAR (AFB, MYCOBACTERIA): Acid Fast Smear: NEGATIVE

## 2017-08-29 LAB — HIV ANTIBODY (ROUTINE TESTING W REFLEX): HIV Screen 4th Generation wRfx: NONREACTIVE

## 2017-08-29 LAB — TRIGLYCERIDES, BODY FLUIDS: TRIGLYCERIDES FL: 42 mg/dL

## 2017-08-29 LAB — PROCALCITONIN: Procalcitonin: <0.1 ng/mL

## 2017-08-29 LAB — PH, BODY FLUID: pH, Body Fluid: 7.5

## 2017-08-29 MED ORDER — CARVEDILOL 6.25 MG PO TABS: 6.2500 mg | ORAL_TABLET | Freq: Two times a day (BID) | ORAL | 2 refills | Status: DC

## 2017-08-29 MED ORDER — HYDROCOD POLST-CPM POLST ER 10-8 MG/5ML PO SUER: 5.0000 mL | Freq: Two times a day (BID) | ORAL | 0 refills | Status: DC

## 2017-08-29 MED ORDER — LISINOPRIL 10 MG PO TABS: 10.0000 mg | ORAL_TABLET | Freq: Every day | ORAL | 2 refills | Status: DC

## 2017-08-31 LAB — CYTOLOGY - NON PAP

## 2017-09-01 ENCOUNTER — Telehealth: Payer: Self-pay

## 2017-09-01 LAB — BODY FLUID CULTURE: CULTURE: NO GROWTH

## 2017-09-01 NOTE — Telephone Encounter (Signed)
EMMI Follow-up: Mr. Bublitz was listed on the Future Patient List on the EMMI report and he called as he had received an automated call. He is waiting on the [place] to approve his cough medicine, as of yesterday afternoon they were still reviewing the request. He made a follow-up appointment with Dr. Kary Kos next Tuesday as they told him the test results wouldn't be available until the end of this week and that way they can review them.  I let him know there would be another automated call and if he had any concerns at that time to let us know.

## 2017-09-01 NOTE — Discharge Summary (Signed)
[facility] NAME: Brian Casey    MR#:  [id number]  DATE OF BIRTH:  [birth year]  DATE OF ADMISSION:  08/27/2017   ADMITTING PHYSICIAN: [name], MD  DATE OF DISCHARGE: 08/29/2017 12:00 PM  PRIMARY CARE PHYSICIAN: [name], MD   ADMISSION DIAGNOSIS:   Hyponatremia [E87.1] Pleural effusion [J90]  DISCHARGE DIAGNOSIS:   Principal Problem:   Pleural effusion Active Problems:   Acute renal failure (HCC)   Hyponatremia   Acute renal failure (ARF) ([place])   SECONDARY DIAGNOSIS:   Past Medical History:  Diagnosis Date  . Acute renal failure ([place]) 11/06/2015   [name] 11/06/2015  . Anxiety   . Arthritis    "lower spine; fingers" (11/07/2015)  . Depression   . GERD (gastroesophageal reflux disease)   . High cholesterol   . History of bronchitis   . Hypertension 10/17/2013  . Hyperthyroidism    "had it radiated in his '21s"  . Hypothyroidism   . New onset atrial fibrillation ([name]) 11/06/2015   [name] 11/07/2015  . NSTEMI (non-ST elevated myocardial infarction) ([place]) 11/07/2015  . Thyroid disease   . Type II diabetes mellitus ([place])    type II  . Wears glasses     HOSPITAL COURSE:   67 year old male with past medical history significant for CAD status post CABG, paroxysmal A. fib on Eliquis, hypertension, GERD, anxiety and depression was brought to hospital secondary to cough and chest x-ray consistent with pleural effusion  1.  Hyponatremia-secondary to dehydration, also was taking chlorthalidone at home. -Discontinued diuretics.  Gentle hydration being given -Improved sodium prior to discharge  2.  Acute renal failure-again prerenal causes, gentle hydration with improvement to baseline Has known CKD stage 3 with known cr of 1.4 -Avoid nephrotoxins.   3.  Cough-secondary to left pleural effusion-unknown cause of pleural effusion  at this time -CT chest with no pneumonia, no tumors seen. Repeat CT  chest done as CXR after thoracentesis showed a possible 53m left lower lobe nodule- but repeat CT did not show any nodules - bloody fluid on thoracentesis, could be frm patients eliquis that he took 1 day prior.  Culture negative for any infection.  So not on any antibiotics -Cytology ordered -Has history of CAD, no known history of congestive heart failure. -Cough medicines added. -Patient is stable on room air  4.  Diabetes mellitus- on metformin.  5.  Atrial fibrillation-rate controlled.  on coreg at home- restarted at a low dose due to bradycardia in the hospital. - restarted eliquis at discharge Was held in the hospital due to scheduled thoracentesis  Stable and will be discharged home  DISCHARGE CONDITIONS:   Guarded  CONSULTS OBTAINED:   None  DRUG ALLERGIES:   Allergies  Allergen Reactions  . Penicillins Nausea And Vomiting and Other (See Comments)    REACTION: Vomiting Has patient had a PCN reaction causing immediate rash, facial/tongue/throat swelling, SOB or lightheadedness with hypotension: Yes Has patient had a PCN reaction causing severe rash involving mucus membranes or skin necrosis: No Has patient had a PCN reaction that required hospitalization No Has patient had a PCN reaction occurring within the last 10 years: No If all of the above answers are "NO", then may proceed with Cephalosporin use.  . Bupropion Hcl     REACTION: Severe constipation, insomnia   DISCHARGE MEDICATIONS:   Allergies as of 08/29/2017      Reactions   Penicillins Nausea And Vomiting, Other (  See Comments)   REACTION: Vomiting Has patient had a PCN reaction causing immediate rash, facial/tongue/throat swelling, SOB or lightheadedness with hypotension: Yes Has patient had a PCN reaction causing severe rash involving mucus membranes or skin necrosis: No Has patient had a PCN reaction that required hospitalization No Has patient had a PCN reaction occurring within the last 10 years:  No If all of the above answers are "NO", then may proceed with Cephalosporin use.   Bupropion Hcl    REACTION: Severe constipation, insomnia      Medication List    STOP taking these medications   escitalopram 10 MG tablet Commonly known as:  LEXAPRO   metoprolol tartrate 25 MG tablet Commonly known as:  LOPRESSOR     TAKE these medications   apixaban 5 MG Tabs tablet Commonly known as:  ELIQUIS Take 1 tablet (5 mg total) by mouth 2 (two) times daily.   atorvastatin 40 MG tablet Commonly known as:  LIPITOR Take 40 mg by mouth daily.   carvedilol 6.25 MG tablet Commonly known as:  COREG Take 1 tablet (6.25 mg total) by mouth 2 (two) times daily. What changed:    medication strength  how much to take   chlorpheniramine-HYDROcodone 10-8 MG/5ML Suer Commonly known as:  TUSSIONEX Take 5 mLs by mouth every 12 (twelve) hours.   levothyroxine 100 MCG tablet Commonly known as:  SYNTHROID, LEVOTHROID Take 1 tablet (100 mcg total) by mouth daily before breakfast. OFFICE VISIT WITH  LABS REQUIRED FOR ADDITIONAL REFILLS What changed:    how much to take  additional instructions   lisinopril 10 MG tablet Commonly known as:  PRINIVIL,ZESTRIL Take 1 tablet (10 mg total) by mouth daily. What changed:    medication strength  how much to take   metFORMIN 500 MG tablet Commonly known as:  GLUCOPHAGE Take 500 mg by mouth 2 (two) times daily with a meal.   omeprazole 20 MG capsule Commonly known as:  PRILOSEC Take 1 capsule (20 mg total) by mouth daily. OFFICE VISIT REQUIRED FOR ADDITIONAL REFILLS   potassium chloride 10 MEQ tablet Commonly known as:  K-DUR Take 1 tablet by mouth daily.   sertraline 25 MG tablet Commonly known as:  ZOLOFT Take 1 tablet by mouth daily.        DISCHARGE INSTRUCTIONS:   1. PCP f/u in 1-2 weeks  DIET:   Cardiac diet  ACTIVITY:   Activity as tolerated  OXYGEN:   Home Oxygen: No.  Oxygen Delivery: room air  DISCHARGE  LOCATION:   home   If you experience worsening of your admission symptoms, develop shortness of breath, life threatening emergency, suicidal or homicidal thoughts you must seek medical attention immediately by calling 911 or calling your MD immediately  if symptoms less severe.  You Must read complete instructions/literature along with all the possible adverse reactions/side effects for all the Medicines you take and that have been prescribed to you. Take any new Medicines after you have completely understood and accpet all the possible adverse reactions/side effects.   Please note  You were cared for by a hospitalist during your hospital stay. If you have any questions about your discharge medications or the care you received while you were in the hospital after you are discharged, you can call the unit and asked to speak with the hospitalist on call if the hospitalist that took care of you is not available. Once you are discharged, your primary care physician will handle any further medical issues.  Please note that NO REFILLS for any discharge medications will be authorized once you are discharged, as it is imperative that you return to your primary care physician (or establish a relationship with a primary care physician if you do not have one) for your aftercare needs so that they can reassess your need for medications and monitor your lab values.    On the day of Discharge:  VITAL SIGNS:   Blood pressure 130/67, pulse 64, temperature 98.7 F (37.1 C), temperature source Oral, resp. rate 20, height _0  (1.702 m), weight 92.6 kg (204 lb 2.3 oz), SpO2 93 %.  PHYSICAL EXAMINATION:    GENERAL:  67 y.o.-year-old patient lying in the bed with no acute distress.  EYES: Pupils equal, round, reactive to light and accommodation. No scleral icterus. Extraocular muscles intact.  HEENT: Head atraumatic, normocephalic. Oropharynx and nasopharynx clear.  NECK:  Supple, no jugular venous distention.  No thyroid enlargement, no tenderness.  LUNGS: Normal breath sounds bilaterally, no wheezing, rales,rhonchi or crepitation. Decreased left basilar breath sounds -No use of accessory muscles of respiration.  CARDIOVASCULAR: S1, S2 normal. No murmurs, rubs, or gallops.  ABDOMEN: Soft, nontender, nondistended. Bowel sounds present. No organomegaly or mass.  EXTREMITIES: No pedal edema, cyanosis, or clubbing.  NEUROLOGIC: Cranial nerves II through XII are intact. Muscle strength 5/5 in all extremities. Sensation intact. Gait not checked.  PSYCHIATRIC: The patient is alert and oriented x 3.  SKIN: No obvious rash, lesion, or ulcer     DATA REVIEW:   CBC Recent Labs  Lab 08/29/17 0536  WBC 6.2  HGB 12.2*  HCT 34.5*  PLT 273    Chemistries  Recent Labs  Lab 08/27/17 1556  08/29/17 0536  NA 125*   < > 132*  K 5.1   < > 4.5  CL 91*   < > 103  CO2 23   < > 22  GLUCOSE 142*   < > 122*  BUN 18   < > 14  CREATININE 1.76*   < > 1.40*  CALCIUM 8.4*   < > 7.9*  AST 19  --   --   ALT 17  --   --   ALKPHOS 56  --   --   BILITOT 1.0  --   --    < > = values in this interval not displayed.     Microbiology Results  Results for orders placed or performed during the hospital encounter of 08/27/17  Body fluid culture     Status: None (Preliminary result)   Collection Time: 08/28/17  2:20 PM  Result Value Ref Range Status   Specimen Description   Final    PLEURAL Performed at [facility], [address], [place], [place] [address]    Special Requests   Final    NONE Performed at [facility], [address] [place], [place] [address]    Gram Stain   Final    ABUNDANT WBC PRESENT,BOTH PMN AND MONONUCLEAR NO ORGANISMS SEEN    Culture   Final    NO GROWTH 3 DAYS Performed at [facility], [place] [address] [place], [place] [address]    Report Status PENDING  Incomplete  Acid Fast Smear (AFB)     Status: None   Collection Time: 08/28/17  2:20 PM    Result Value Ref Range Status   AFB Specimen Processing Concentration  Final   Acid Fast Smear Negative  Final    Comment: (  NOTE) Performed At: [facility] [place], [place] [id number] Rush Farmer MD SF:[id number]    Source (AFB) PLEURAL  Final    Comment: Performed at [facility], [address] [place], [place] [address]    RADIOLOGY:  No results found.   Management plans discussed with the patient, family and they are in agreement.  CODE STATUS:  Code Status History    Date Active Date Inactive Code Status Order ID Comments User Context   08/27/2017 2133 08/29/2017 1505 Full Code [id number]  [name], MD Inpatient   11/07/2015 1553 11/14/2015 1607 Full Code [id number]  Erma Heritage, PA Inpatient   11/04/2015 1206 11/04/2015 1701 Full Code [id number]  Bettey Costa, MD ED      TOTAL TIME TAKING CARE OF THIS PATIENT: 38  minutes.    Gladstone Lighter M.D on 09/01/2017 at 10:31 AM  Between 7am to 6pm - Pager - [phone]  After 6pm go to [email or web address] - Proofreader  [facility]  Office  [phone]  CC: Primary care physician; [name], MD   Note: This dictation was prepared with Dragon dictation along with smaller phrase technology. Any transcriptional errors that result from this process are unintentional.

## 2017-09-07 DIAGNOSIS — J90 Pleural effusion, not elsewhere classified: Secondary | ICD-10-CM | POA: Diagnosis not present

## 2017-09-07 DIAGNOSIS — E871 Hypo-osmolality and hyponatremia: Secondary | ICD-10-CM | POA: Diagnosis not present

## 2017-09-15 DIAGNOSIS — J90 Pleural effusion, not elsewhere classified: Secondary | ICD-10-CM | POA: Diagnosis not present

## 2017-09-15 DIAGNOSIS — R918 Other nonspecific abnormal finding of lung field: Secondary | ICD-10-CM | POA: Diagnosis not present

## 2017-09-15 DIAGNOSIS — R197 Diarrhea, unspecified: Secondary | ICD-10-CM | POA: Diagnosis not present

## 2017-09-15 DIAGNOSIS — J948 Other specified pleural conditions: Secondary | ICD-10-CM | POA: Diagnosis not present

## 2017-09-15 DIAGNOSIS — R0609 Other forms of dyspnea: Secondary | ICD-10-CM | POA: Diagnosis not present

## 2017-09-16 DIAGNOSIS — R197 Diarrhea, unspecified: Secondary | ICD-10-CM | POA: Diagnosis not present

## 2017-10-11 LAB — ACID FAST CULTURE WITH REFLEXED SENSITIVITIES (MYCOBACTERIA): Acid Fast Culture: NEGATIVE

## 2017-10-29 ENCOUNTER — Other Ambulatory Visit: Payer: Self-pay | Admitting: *Deleted

## 2017-10-29 NOTE — Patient Outreach (Signed)
10/25/17- Telephone call to pt for follow up on HTA health risk assessment completed by pt, spoke with pt, HIPAA verified, pt reports he sees primary MD Dr. Kary Kos, states diabetes is controlled, takes po meds and has not been instructed to check CBG.  Pt reports he does not have CHF, pt states " the heart problem I've had is open heart surgery and I'm doing well now"  Pt reports he has had pleural effusion in the past " and followed up with pulmonology" and " this problem has cleared up now"  Pt reports he uses his cane quite often, is aware of safety precautions, reports he has had no recent falls, has spouse (CG) that assits with shopping, housekeeping and any needs pt may have.  Pt reports he goes to [place] for care.  Pt feels he does not have any needs for care management at present,  Rn CM mailed successful outreach letter to pt home with 24 hour nurse line magnet.  Jacqlyn Larsen [facility], [place] Coordinator [phone]

## 2017-11-02 DIAGNOSIS — R0609 Other forms of dyspnea: Secondary | ICD-10-CM | POA: Diagnosis not present

## 2017-11-02 DIAGNOSIS — J90 Pleural effusion, not elsewhere classified: Secondary | ICD-10-CM | POA: Diagnosis not present

## 2017-11-02 DIAGNOSIS — R918 Other nonspecific abnormal finding of lung field: Secondary | ICD-10-CM | POA: Diagnosis not present

## 2017-11-09 DIAGNOSIS — I10 Essential (primary) hypertension: Secondary | ICD-10-CM | POA: Diagnosis not present

## 2017-11-09 DIAGNOSIS — Z0000 Encounter for general adult medical examination without abnormal findings: Secondary | ICD-10-CM | POA: Diagnosis not present

## 2017-11-09 DIAGNOSIS — R42 Dizziness and giddiness: Secondary | ICD-10-CM | POA: Diagnosis not present

## 2017-11-09 DIAGNOSIS — E039 Hypothyroidism, unspecified: Secondary | ICD-10-CM | POA: Diagnosis not present

## 2017-11-09 DIAGNOSIS — R197 Diarrhea, unspecified: Secondary | ICD-10-CM | POA: Diagnosis not present

## 2017-11-09 DIAGNOSIS — E785 Hyperlipidemia, unspecified: Secondary | ICD-10-CM | POA: Diagnosis not present

## 2017-11-09 DIAGNOSIS — E119 Type 2 diabetes mellitus without complications: Secondary | ICD-10-CM | POA: Diagnosis not present

## 2017-11-09 DIAGNOSIS — Z125 Encounter for screening for malignant neoplasm of prostate: Secondary | ICD-10-CM | POA: Diagnosis not present

## 2017-11-09 DIAGNOSIS — H9193 Unspecified hearing loss, bilateral: Secondary | ICD-10-CM | POA: Diagnosis not present

## 2017-12-30 DIAGNOSIS — J029 Acute pharyngitis, unspecified: Secondary | ICD-10-CM | POA: Diagnosis not present

## 2018-01-12 DIAGNOSIS — E039 Hypothyroidism, unspecified: Secondary | ICD-10-CM | POA: Diagnosis not present

## 2018-01-14 DIAGNOSIS — Z8709 Personal history of other diseases of the respiratory system: Secondary | ICD-10-CM | POA: Diagnosis not present

## 2018-01-14 DIAGNOSIS — R05 Cough: Secondary | ICD-10-CM | POA: Diagnosis not present

## 2018-01-14 DIAGNOSIS — J029 Acute pharyngitis, unspecified: Secondary | ICD-10-CM | POA: Diagnosis not present

## 2018-02-01 DIAGNOSIS — R0602 Shortness of breath: Secondary | ICD-10-CM | POA: Diagnosis not present

## 2018-02-01 DIAGNOSIS — J90 Pleural effusion, not elsewhere classified: Secondary | ICD-10-CM | POA: Diagnosis not present

## 2018-02-01 DIAGNOSIS — J849 Interstitial pulmonary disease, unspecified: Secondary | ICD-10-CM | POA: Diagnosis not present

## 2018-02-01 DIAGNOSIS — R918 Other nonspecific abnormal finding of lung field: Secondary | ICD-10-CM | POA: Diagnosis not present

## 2018-02-01 DIAGNOSIS — R05 Cough: Secondary | ICD-10-CM | POA: Diagnosis not present

## 2018-02-03 DIAGNOSIS — E119 Type 2 diabetes mellitus without complications: Secondary | ICD-10-CM | POA: Diagnosis not present

## 2018-02-03 DIAGNOSIS — E785 Hyperlipidemia, unspecified: Secondary | ICD-10-CM | POA: Diagnosis not present

## 2018-02-03 DIAGNOSIS — I10 Essential (primary) hypertension: Secondary | ICD-10-CM | POA: Diagnosis not present

## 2018-02-03 DIAGNOSIS — E039 Hypothyroidism, unspecified: Secondary | ICD-10-CM | POA: Diagnosis not present

## 2018-04-05 DIAGNOSIS — R69 Illness, unspecified: Secondary | ICD-10-CM | POA: Diagnosis not present

## 2018-05-13 ENCOUNTER — Encounter: Payer: Self-pay | Admitting: Gastroenterology

## 2018-05-25 ENCOUNTER — Other Ambulatory Visit: Payer: Self-pay | Admitting: Specialist

## 2018-05-25 DIAGNOSIS — J849 Interstitial pulmonary disease, unspecified: Secondary | ICD-10-CM

## 2018-06-03 DIAGNOSIS — E039 Hypothyroidism, unspecified: Secondary | ICD-10-CM | POA: Diagnosis not present

## 2018-06-03 DIAGNOSIS — E119 Type 2 diabetes mellitus without complications: Secondary | ICD-10-CM | POA: Diagnosis not present

## 2018-06-06 DIAGNOSIS — R197 Diarrhea, unspecified: Secondary | ICD-10-CM | POA: Diagnosis not present

## 2018-06-06 DIAGNOSIS — E785 Hyperlipidemia, unspecified: Secondary | ICD-10-CM | POA: Diagnosis not present

## 2018-06-06 DIAGNOSIS — E039 Hypothyroidism, unspecified: Secondary | ICD-10-CM | POA: Diagnosis not present

## 2018-06-06 DIAGNOSIS — E119 Type 2 diabetes mellitus without complications: Secondary | ICD-10-CM | POA: Diagnosis not present

## 2018-06-06 DIAGNOSIS — I10 Essential (primary) hypertension: Secondary | ICD-10-CM | POA: Diagnosis not present

## 2018-08-01 ENCOUNTER — Ambulatory Visit
Admission: RE | Admit: 2018-08-01 | Discharge: 2018-08-01 | Disposition: A | Discharge status: Home / Self Care | Payer: No Typology Code available for payment source | Source: Ambulatory Visit | Attending: Specialist | Admitting: Specialist

## 2018-08-01 ENCOUNTER — Other Ambulatory Visit: Payer: Self-pay

## 2018-08-01 DIAGNOSIS — J849 Interstitial pulmonary disease, unspecified: Secondary | ICD-10-CM | POA: Insufficient documentation

## 2018-08-03 DIAGNOSIS — J90 Pleural effusion, not elsewhere classified: Secondary | ICD-10-CM | POA: Diagnosis not present

## 2018-08-03 DIAGNOSIS — R918 Other nonspecific abnormal finding of lung field: Secondary | ICD-10-CM | POA: Diagnosis not present

## 2018-08-03 DIAGNOSIS — J479 Bronchiectasis, uncomplicated: Secondary | ICD-10-CM | POA: Diagnosis not present

## 2019-07-05 ENCOUNTER — Other Ambulatory Visit: Payer: Self-pay | Admitting: Specialist

## 2019-07-05 DIAGNOSIS — R0609 Other forms of dyspnea: Secondary | ICD-10-CM

## 2019-07-05 DIAGNOSIS — J849 Interstitial pulmonary disease, unspecified: Secondary | ICD-10-CM

## 2019-07-07 ENCOUNTER — Ambulatory Visit: Payer: Medicare HMO | Admitting: Dermatology

## 2019-07-07 ENCOUNTER — Other Ambulatory Visit: Payer: Self-pay

## 2019-07-07 DIAGNOSIS — L820 Inflamed seborrheic keratosis: Secondary | ICD-10-CM | POA: Diagnosis not present

## 2019-07-07 DIAGNOSIS — L578 Other skin changes due to chronic exposure to nonionizing radiation: Secondary | ICD-10-CM

## 2019-07-07 DIAGNOSIS — L739 Follicular disorder, unspecified: Secondary | ICD-10-CM | POA: Diagnosis not present

## 2019-07-07 DIAGNOSIS — L814 Other melanin hyperpigmentation: Secondary | ICD-10-CM | POA: Diagnosis not present

## 2019-07-07 DIAGNOSIS — L219 Seborrheic dermatitis, unspecified: Secondary | ICD-10-CM

## 2019-07-07 DIAGNOSIS — L821 Other seborrheic keratosis: Secondary | ICD-10-CM

## 2019-07-07 MED ORDER — FLUOCINOLONE ACETONIDE 0.01 % OT OIL: TOPICAL_OIL | OTIC | 3 refills | Status: DC

## 2019-07-07 NOTE — Progress Notes (Addendum)
Follow-Up Visit   Subjective  Brian Casey is a 69 y.o. male who presents for the following: recheck ISK vs pigmented AK (frontal scalp - treated with LN2, resolved per patient but now he has a similar lesion on the scalp that he would like checked), rash (inside R ear - scaly and itchy for years now that comes and goes), and history of folliculitis (on the scalp - controlled with Ketoconazole 2% shampoo and Flucinonide solution.).  The following portions of the chart were reviewed this encounter and updated as appropriate:     Review of Systems: No other skin or systemic complaints.  Objective  Well appearing patient in no apparent distress; mood and affect are within normal limits.  A focused examination was performed including the scalp and right ear. Relevant physical exam findings are noted in the Assessment and Plan.  Objective  Scalp: Clear  Objective  Frontal Scalp: Erythematous keratotic waxy stuck-on papule.  Objective  Right Ear: Mild erythema and scale R ear canal  Assessment & Plan  Folliculitis Scalp  Improved with treatment Continue Ketoconazole 2% shampoo let sit 5-10 minutes before washing out, and Fluocinonide solution to aa's QD PRN.  Inflamed seborrheic keratosis Frontal Scalp  Destruction of lesion - Frontal Scalp  Destruction method: cryotherapy   Informed consent: discussed and consent obtained   Lesion destroyed using liquid nitrogen: Yes   Region frozen until ice ball extended beyond lesion: Yes   Outcome: patient tolerated procedure well with no complications   Post-procedure details: wound care instructions given    Seborrheic dermatitis Right Ear  OK to use Ketoconazole 2% shampoo to aa while in the shower let sit couple minutes then wash out. Start Fluocinolone 0.01% oil (Dermotic) to ear QD-BID PRN.  patient to take written prescription to Dr. Barbarann Ehlers office so he can send it to the [place].   Fluocinolone Acetonide (DERMOTIC) 0.01  % OIL - Right Ear Seborrheic Keratoses - Stuck-on, waxy, tan-brown papules and plaques  - Discussed benign etiology and prognosis. - Observe - Call for any changes  Lentigines - Scattered tan macules - Discussed due to sun exposure - Benign, observe - Call for any changes  Actinic Damage - diffuse scaly erythematous macules with underlying dyspigmentation - Recommend daily broad spectrum sunscreen SPF 30+ to sun-exposed areas, reapply every 2 hours as needed.  - Call for new or changing lesions.  Return in about 6 months (around 01/06/2020).   Luther Redo, CMA, am acting as scribe for Brendolyn Patty, MD .  Documentation: I have reviewed the above documentation for accuracy and completeness, and I agree with the above.  Brendolyn Patty MD

## 2019-07-10 ENCOUNTER — Encounter: Payer: Self-pay | Admitting: Dermatology

## 2019-07-12 ENCOUNTER — Other Ambulatory Visit: Payer: Self-pay

## 2019-07-12 DIAGNOSIS — L219 Seborrheic dermatitis, unspecified: Secondary | ICD-10-CM

## 2019-07-12 MED ORDER — FLUOCINOLONE ACETONIDE 0.01 % OT OIL: TOPICAL_OIL | OTIC | 1 refills | Status: DC

## 2019-07-22 ENCOUNTER — Encounter: Payer: Self-pay | Admitting: Dermatology

## 2019-08-01 ENCOUNTER — Encounter: Payer: Self-pay | Admitting: Dermatology

## 2019-08-03 ENCOUNTER — Other Ambulatory Visit: Payer: Self-pay

## 2019-08-03 ENCOUNTER — Other Ambulatory Visit: Payer: Self-pay | Admitting: Specialist

## 2019-08-03 ENCOUNTER — Ambulatory Visit
Admission: RE | Admit: 2019-08-03 | Discharge: 2019-08-03 | Disposition: A | Discharge status: Home / Self Care | Payer: No Typology Code available for payment source | Source: Ambulatory Visit | Attending: Specialist | Admitting: Specialist

## 2019-08-03 DIAGNOSIS — R0609 Other forms of dyspnea: Secondary | ICD-10-CM

## 2019-08-03 DIAGNOSIS — R0600 Dyspnea, unspecified: Secondary | ICD-10-CM | POA: Diagnosis present

## 2019-08-03 DIAGNOSIS — J849 Interstitial pulmonary disease, unspecified: Secondary | ICD-10-CM | POA: Diagnosis present

## 2020-01-09 ENCOUNTER — Ambulatory Visit: Payer: Non-veteran care | Admitting: Dermatology

## 2020-06-04 ENCOUNTER — Other Ambulatory Visit: Payer: Self-pay | Admitting: Specialist

## 2020-06-04 DIAGNOSIS — R0609 Other forms of dyspnea: Secondary | ICD-10-CM

## 2020-06-04 DIAGNOSIS — R0600 Dyspnea, unspecified: Secondary | ICD-10-CM

## 2020-06-04 DIAGNOSIS — J849 Interstitial pulmonary disease, unspecified: Secondary | ICD-10-CM

## 2020-07-01 ENCOUNTER — Ambulatory Visit: Admission: RE | Admit: 2020-07-01 | Payer: No Typology Code available for payment source | Source: Ambulatory Visit

## 2020-07-19 ENCOUNTER — Ambulatory Visit
Admission: RE | Admit: 2020-07-19 | Discharge: 2020-07-19 | Disposition: A | Discharge status: Home / Self Care | Payer: No Typology Code available for payment source | Source: Ambulatory Visit | Attending: Specialist | Admitting: Specialist

## 2020-07-19 ENCOUNTER — Other Ambulatory Visit: Payer: Self-pay

## 2020-07-19 DIAGNOSIS — R0600 Dyspnea, unspecified: Secondary | ICD-10-CM | POA: Insufficient documentation

## 2020-07-19 DIAGNOSIS — J849 Interstitial pulmonary disease, unspecified: Secondary | ICD-10-CM | POA: Diagnosis not present

## 2020-07-19 DIAGNOSIS — R0609 Other forms of dyspnea: Secondary | ICD-10-CM

## 2020-11-06 ENCOUNTER — Other Ambulatory Visit: Payer: Self-pay | Admitting: Pulmonary Disease

## 2020-11-06 DIAGNOSIS — J849 Interstitial pulmonary disease, unspecified: Secondary | ICD-10-CM

## 2021-02-05 ENCOUNTER — Ambulatory Visit: Payer: No Typology Code available for payment source

## 2021-02-12 ENCOUNTER — Ambulatory Visit
Admission: RE | Admit: 2021-02-12 | Discharge: 2021-02-12 | Disposition: A | Discharge status: Home / Self Care | Payer: No Typology Code available for payment source | Source: Ambulatory Visit | Attending: Pulmonary Disease | Admitting: Pulmonary Disease

## 2021-02-12 ENCOUNTER — Other Ambulatory Visit: Payer: Self-pay

## 2021-02-12 DIAGNOSIS — J849 Interstitial pulmonary disease, unspecified: Secondary | ICD-10-CM | POA: Insufficient documentation

## 2021-02-12 LAB — POCT I-STAT CREATININE: Creatinine, Ser: 1.4 mg/dL — ABNORMAL HIGH (ref 0.61–1.24)

## 2021-02-12 MED ORDER — IOHEXOL 300 MG/ML  SOLN
75.0000 mL | Freq: Once | INTRAMUSCULAR | Status: AC | PRN
  Administered 2021-02-12: 75 mL via INTRAVENOUS

## 2021-04-23 ENCOUNTER — Ambulatory Visit: Payer: Medicare HMO | Admitting: Dermatology

## 2021-04-23 ENCOUNTER — Other Ambulatory Visit: Payer: Self-pay

## 2021-04-23 DIAGNOSIS — L578 Other skin changes due to chronic exposure to nonionizing radiation: Secondary | ICD-10-CM | POA: Diagnosis not present

## 2021-04-23 DIAGNOSIS — L570 Actinic keratosis: Secondary | ICD-10-CM | POA: Diagnosis not present

## 2021-04-23 DIAGNOSIS — L821 Other seborrheic keratosis: Secondary | ICD-10-CM

## 2021-04-23 DIAGNOSIS — L814 Other melanin hyperpigmentation: Secondary | ICD-10-CM

## 2021-04-23 NOTE — Progress Notes (Signed)
° °  Follow-Up Visit   Subjective  Brian Casey is a 71 y.o. male who presents for the following: Growths (Scalp. Crusted.) and Warts (Foot. ).   The following portions of the chart were reviewed this encounter and updated as appropriate:       Review of Systems:  No other skin or systemic complaints except as noted in HPI or Assessment and Plan.  Objective  Well appearing patient in no apparent distress; mood and affect are within normal limits.  A focused examination was performed including scalp, feet. Relevant physical exam findings are noted in the Assessment and Plan.  Scalp x 5 (5) Pink scaly macules and papules.     Assessment & Plan  Actinic Damage - chronic, secondary to cumulative UV radiation exposure/sun exposure over time - diffuse scaly erythematous macules with underlying dyspigmentation - Recommend daily broad spectrum sunscreen SPF 30+ to sun-exposed areas, reapply every 2 hours as needed.  - Recommend staying in the shade or wearing long sleeves, sun glasses (UVA+UVB protection) and wide brim hats (4-inch brim around the entire circumference of the hat). - Call for new or changing lesions.  Lentigines - Scattered tan macules - Due to sun exposure - Benign-appering, observe - Recommend daily broad spectrum sunscreen SPF 30+ to sun-exposed areas, reapply every 2 hours as needed. - Call for any changes  Seborrheic Keratoses - Stuck-on, waxy, tan-brown papules and/or plaques of the scalp, ankles, feet - Benign-appearing - Discussed benign etiology and prognosis. - Observe - Call for any changes - Samples of Eucerin Roughness Relief, Cetaphil Rough & Bumpy. Tsal shampoo for scalp   AK (actinic keratosis) (5) Scalp x 5  vs ISKs  Actinic keratoses are precancerous spots that appear secondary to cumulative UV radiation exposure/sun exposure over time. They are chronic with expected duration over 1 year. A portion of actinic keratoses will progress to  squamous cell carcinoma of the skin. It is not possible to reliably predict which spots will progress to skin cancer and so treatment is recommended to prevent development of skin cancer.  Recommend daily broad spectrum sunscreen SPF 30+ to sun-exposed areas, reapply every 2 hours as needed.  Recommend staying in the shade or wearing long sleeves, sun glasses (UVA+UVB protection) and wide brim hats (4-inch brim around the entire circumference of the hat). Call for new or changing lesions.  Destruction of lesion - Scalp x 5  Destruction method: cryotherapy   Informed consent: discussed and consent obtained   Lesion destroyed using liquid nitrogen: Yes   Region frozen until ice ball extended beyond lesion: Yes   Outcome: patient tolerated procedure well with no complications   Post-procedure details: wound care instructions given   Additional details:  Prior to procedure, discussed risks of blister formation, small wound, skin dyspigmentation, or rare scar following cryotherapy. Recommend Vaseline ointment to treated areas while healing.    Return if symptoms worsen or fail to improve.  IJamesetta Orleans, CMA, am acting as scribe for Brendolyn Patty, MD .  Documentation: I have reviewed the above documentation for accuracy and completeness, and I agree with the above.  Brendolyn Patty MD

## 2021-04-23 NOTE — Patient Instructions (Addendum)
Cryotherapy Aftercare  Wash gently with soap and water everyday.   Apply Vaseline and Band-Aid daily until healed.    Recommend Neutrogena T-Sal Shampoo - massage into scalp and let sit several minutes before rinsing.   May use Cetaphil Rough & Bumpy Moisturizer or Eucerin Spot Treatment Cream to help smooth areas on ankles, feet, and scalp. Avoid applying to scalp in the morning.   Seborrheic Keratosis  What causes seborrheic keratoses? Seborrheic keratoses are harmless, common skin growths that first appear during adult life.  As time goes by, more growths appear.  Some people may develop a large number of them.  Seborrheic keratoses appear on both covered and uncovered body parts.  They are not caused by sunlight.  The tendency to develop seborrheic keratoses can be inherited.  They vary in color from skin-colored to gray, brown, or even black.  They can be either smooth or have a rough, warty surface.   Seborrheic keratoses are superficial and look as if they were stuck on the skin.  Under the microscope this type of keratosis looks like layers upon layers of skin.  That is why at times the top layer may seem to fall off, but the rest of the growth remains and re-grows.    Treatment Seborrheic keratoses do not need to be treated, but can easily be removed in the office.  Seborrheic keratoses often cause symptoms when they rub on clothing or jewelry.  Lesions can be in the way of shaving.  If they become inflamed, they can cause itching, soreness, or burning.  Removal of a seborrheic keratosis can be accomplished by freezing, burning, or surgery. If any spot bleeds, scabs, or grows rapidly, please return to have it checked, as these can be an indication of a skin cancer.   If You Need Anything After Your Visit  If you have any questions or concerns for your doctor, please call our main line at [phone] and press option 4 to reach your doctor's medical assistant. If no one answers,  please leave a voicemail as directed and we will return your call as soon as possible. Messages left after 4 pm will be answered the following business day.   You may also send [place] a message via [place]. We typically respond to MyChart messages within 1-2 business days.  For prescription refills, please ask your pharmacy to contact our office. Our fax number is [phone].  If you have an urgent issue when the clinic is closed that cannot wait until the next business day, you can page your doctor at the number below.    Please note that while we do our best to be available for urgent issues outside of office hours, we are not available 24/7.   If you have an urgent issue and are unable to reach [place], you may choose to seek medical care at your doctor's office, retail clinic, urgent care center, or emergency room.  If you have a medical emergency, please immediately call 911 or go to the emergency department.  Pager Numbers  - Dr. Nehemiah Massed: [phone]  - Dr. Laurence Ferrari: [phone]  - Dr. Nicole Kindred: [phone]  In the event of inclement weather, please call our main line at [phone] for an update on the status of any delays or closures.  Dermatology Medication Tips: Please keep the boxes that topical medications come in in order to help keep track of the instructions about where and how to use these. Pharmacies typically print the medication instructions only on the  boxes and not directly on the medication tubes.   If your medication is too expensive, please contact our office at [phone] option 4 or send [place] a message through [place].   We are unable to tell what your co-pay for medications will be in advance as this is different depending on your insurance coverage. However, we may be able to find a substitute medication at lower cost or fill out paperwork to get insurance to cover a needed medication.   If a prior authorization is required to get your medication covered by your  insurance company, please allow [place] 1-2 business days to complete this process.  Drug prices often vary depending on where the prescription is filled and some pharmacies may offer cheaper prices.  The website www.goodrx.com contains coupons for medications through different pharmacies. The prices here do not account for what the cost may be with help from insurance (it may be cheaper with your insurance), but the website can give you the price if you did not use any insurance.  - You can print the associated coupon and take it with your prescription to the pharmacy.  - You may also stop by our office during regular business hours and pick up a GoodRx coupon card.  - If you need your prescription sent electronically to a different pharmacy, notify our office through [facility] or by phone at [phone] option 4.     Si Usted Necesita Algo Despus de Su Visita  Tambin puede enviarnos un mensaje a travs de Pharmacist, community. Por lo general respondemos a los mensajes de MyChart en el transcurso de 1 a 2 das hbiles.  Para renovar recetas, por favor pida a su farmacia que se ponga en contacto con nuestra oficina. Harland Dingwall de fax es [place] [phone].  Si tiene un asunto urgente cuando la clnica est cerrada y que no puede esperar hasta el siguiente da hbil, puede llamar/localizar a su doctor(a) al nmero que aparece a continuacin.   Por favor, tenga en cuenta que aunque hacemos todo lo posible para estar disponibles para asuntos urgentes fuera del horario de [place], no estamos disponibles las 24 horas del da, los 7 das de la [place].   Si tiene un problema urgente y no puede comunicarse con nosotros, puede optar por buscar atencin mdica  en el consultorio de su doctor(a), en una clnica privada, en un centro de atencin urgente o en una sala de emergencias.  Si tiene Engineering geologist, por favor llame inmediatamente al 911 o vaya a la sala de emergencias.  Nmeros de  bper  - Dr. Nehemiah Massed: [phone]  - Dra. Moye: [phone]  - Dra. Nicole Kindred: [phone]  En caso de inclemencias del [place], por favor llame a Johnsie Kindred principal al [phone] para una actualizacin sobre el [place] de cualquier retraso o cierre.  Consejos para la medicacin en dermatologa: Por favor, guarde las cajas en las que vienen los medicamentos de uso tpico para ayudarle a seguir las instrucciones sobre dnde y cmo usarlos. Las farmacias generalmente imprimen las instrucciones del medicamento slo en las cajas y no directamente en los tubos del [place].   Si su medicamento es muy caro, por favor, pngase en contacto con Zigmund Daniel llamando al [phone] y presione la opcin 4 o envenos un mensaje a travs de Pharmacist, community.   No podemos decirle cul ser su copago por los medicamentos por adelantado ya que esto es diferente dependiendo de la cobertura de su seguro. Sin embargo, es posible que podamos  encontrar un medicamento sustituto a Electrical engineer un formulario para que el seguro cubra el medicamento que se considera necesario.   Si se requiere una autorizacin previa para que su compaa de seguros [place] su medicamento, por favor permtanos de 1 a 2 das hbiles para completar este proceso.  Los precios de los medicamentos varan con frecuencia dependiendo del Environmental consultant de dnde se surte la receta y alguna farmacias pueden ofrecer precios ms baratos.  El sitio web www.goodrx.com tiene cupones para medicamentos de Airline pilot. Los precios aqu no tienen en cuenta lo que podra costar con la ayuda del seguro (puede ser ms barato con su seguro), pero el sitio web puede darle el precio si no utiliz Research scientist (physical sciences).  - Puede imprimir el cupn correspondiente y llevarlo con su receta a la farmacia.  - Tambin puede pasar por nuestra oficina durante el horario de atencin regular y Charity fundraiser una tarjeta de cupones de GoodRx.  - Si necesita que su receta se  enve electrnicamente a una farmacia diferente, informe a nuestra oficina a travs de MyChart de [place] o por telfono llamando al [phone] y presione la opcin 4.

## 2021-08-12 DIAGNOSIS — R42 Dizziness and giddiness: Secondary | ICD-10-CM | POA: Insufficient documentation

## 2021-08-12 DIAGNOSIS — R609 Edema, unspecified: Secondary | ICD-10-CM | POA: Insufficient documentation

## 2021-08-12 DIAGNOSIS — K219 Gastro-esophageal reflux disease without esophagitis: Secondary | ICD-10-CM | POA: Insufficient documentation

## 2021-08-12 DIAGNOSIS — I6523 Occlusion and stenosis of bilateral carotid arteries: Secondary | ICD-10-CM | POA: Insufficient documentation

## 2021-08-12 DIAGNOSIS — Z800 Family history of malignant neoplasm of digestive organs: Secondary | ICD-10-CM | POA: Insufficient documentation

## 2021-08-12 DIAGNOSIS — N529 Male erectile dysfunction, unspecified: Secondary | ICD-10-CM | POA: Insufficient documentation

## 2021-08-12 DIAGNOSIS — Z8719 Personal history of other diseases of the digestive system: Secondary | ICD-10-CM | POA: Insufficient documentation

## 2021-08-15 ENCOUNTER — Other Ambulatory Visit: Payer: Self-pay | Admitting: Pulmonary Disease

## 2021-08-15 DIAGNOSIS — J849 Interstitial pulmonary disease, unspecified: Secondary | ICD-10-CM

## 2021-08-20 ENCOUNTER — Ambulatory Visit
Admission: RE | Admit: 2021-08-20 | Discharge: 2021-08-20 | Disposition: A | Discharge status: Home / Self Care | Payer: No Typology Code available for payment source | Source: Ambulatory Visit | Attending: Family Medicine | Admitting: Family Medicine

## 2021-08-20 DIAGNOSIS — J849 Interstitial pulmonary disease, unspecified: Secondary | ICD-10-CM | POA: Diagnosis not present

## 2021-09-12 ENCOUNTER — Encounter: Payer: Self-pay | Admitting: *Deleted

## 2021-09-12 ENCOUNTER — Encounter: Payer: Non-veteran care | Attending: Pulmonary Disease | Admitting: *Deleted

## 2021-09-12 DIAGNOSIS — J849 Interstitial pulmonary disease, unspecified: Secondary | ICD-10-CM

## 2021-09-29 ENCOUNTER — Encounter: Payer: No Typology Code available for payment source | Attending: Pulmonary Disease | Admitting: *Deleted

## 2021-09-29 ENCOUNTER — Ambulatory Visit: Payer: Non-veteran care

## 2021-09-29 VITALS — Ht 67.6 in | Wt 206.0 lb

## 2021-09-29 DIAGNOSIS — J849 Interstitial pulmonary disease, unspecified: Secondary | ICD-10-CM | POA: Diagnosis present

## 2021-09-29 NOTE — Patient Instructions (Addendum)
Patient Instructions  Patient Details  Name: Brian Casey MRN: [id number] Date of Birth: 1950-11-24 Referring Provider:  Cyndie Chime, MD  Below are your personal goals for exercise, nutrition, and risk factors. Our goal is to help you stay on track towards obtaining and maintaining these goals. We will be discussing your progress on these goals with you throughout the program.  Initial Exercise Prescription:  Initial Exercise Prescription - 09/29/21 1300       Date of Initial Exercise RX and Referring Provider   Date 09/29/21    Referring Provider Cyndie Chime MD ([place])   Primary Pulmonologist Dr. Lanney Gins     Oxygen   Oxygen Continuous    Liters 2    Maintain Oxygen Saturation 88% or higher      REL-XR   Level 1    Speed 50    Minutes 15    METs 2      Biostep-RELP   Level 2    SPM 50    Minutes 15    METs 2      Track   Laps 25    Minutes 15    METs 2.36      Prescription Details   Frequency (times per week) 3    Duration Progress to 30 minutes of continuous aerobic without signs/symptoms of physical distress      Intensity   THRR 40-80% of Max Heartrate 100-134    Ratings of Perceived Exertion 11-13    Perceived Dyspnea 0-4      Progression   Progression Continue to progress workloads to maintain intensity without signs/symptoms of physical distress.      Resistance Training   Training Prescription Yes    Weight 3 lb    Reps 10-15             Exercise Goals: Frequency: Be able to perform aerobic exercise two to three times per week in program working toward 2-5 days per week of home exercise.  Intensity: Work with a perceived exertion of 11 (fairly light) - 15 (hard) while following your exercise prescription.  We will make changes to your prescription with you as you progress through the program.   Duration: Be able to do 30 to 45 minutes of continuous aerobic exercise in addition to a 5 minute warm-up and a 5 minute cool-down routine.    Nutrition Goals: Your personal nutrition goals will be established when you do your nutrition analysis with the dietician.  The following are general nutrition guidelines to follow: Cholesterol < 258m/day Sodium < 15042mday Fiber: Men over 50 yrs - 30 grams per day  Personal Goals:  Personal Goals and Risk Factors at Admission - 09/29/21 1349       Core Components/Risk Factors/Patient Goals on Admission    Weight Management Yes;Weight Loss;Obesity    Intervention Weight Management: Develop a combined nutrition and exercise program designed to reach desired caloric intake, while maintaining appropriate intake of nutrient and fiber, sodium and fats, and appropriate energy expenditure required for the weight goal.;Weight Management: Provide education and appropriate resources to help participant work on and attain dietary goals.;Weight Management/Obesity: Establish reasonable short term and long term weight goals.;Obesity: Provide education and appropriate resources to help participant work on and attain dietary goals.    Admit Weight 206 lb (93.4 kg)    Goal Weight: Short Term 200 lb (90.7 kg)    Goal Weight: Long Term 195 lb (88.5 kg)    Expected Outcomes Short Term: Continue to  assess and modify interventions until short term weight is achieved;Weight Loss: Understanding of general recommendations for a balanced deficit meal plan, which promotes 1-2 lb weight loss per week and includes a negative energy balance of [phone] kcal/d;Understanding recommendations for meals to include 15-35% energy as protein, 25-35% energy from fat, 35-60% energy from carbohydrates, less than 285m of dietary cholesterol, 20-35 gm of total fiber daily;Understanding of distribution of calorie intake throughout the day with the consumption of 4-5 meals/snacks;Long Term: Adherence to nutrition and physical activity/exercise program aimed toward attainment of established weight goal    Improve shortness of breath with  ADL's Yes    Intervention Provide education, individualized exercise plan and daily activity instruction to help decrease symptoms of SOB with activities of daily living.    Expected Outcomes Short Term: Improve cardiorespiratory fitness to achieve a reduction of symptoms when performing ADLs;Long Term: Be able to perform more ADLs without symptoms or delay the onset of symptoms    Increase knowledge of respiratory medications and ability to use respiratory devices properly  Yes    Intervention Provide education and demonstration as needed of appropriate use of medications, inhalers, and oxygen therapy.    Expected Outcomes Short Term: Achieves understanding of medications use. Understands that oxygen is a medication prescribed by physician. Demonstrates appropriate use of inhaler and oxygen therapy.;Long Term: Maintain appropriate use of medications, inhalers, and oxygen therapy.    Diabetes Yes    Intervention Provide education about signs/symptoms and action to take for hypo/hyperglycemia.;Provide education about proper nutrition, including hydration, and aerobic/resistive exercise prescription along with prescribed medications to achieve blood glucose in normal ranges: Fasting glucose 65-99 mg/dL    Expected Outcomes Short Term: Participant verbalizes understanding of the signs/symptoms and immediate care of hyper/hypoglycemia, proper foot care and importance of medication, aerobic/resistive exercise and nutrition plan for blood glucose control.;Long Term: Attainment of HbA1C < 7%.    Hypertension Yes    Intervention Provide education on lifestyle modifcations including regular physical activity/exercise, weight management, moderate sodium restriction and increased consumption of fresh fruit, vegetables, and low fat dairy, alcohol moderation, and smoking cessation.;Monitor prescription use compliance.    Expected Outcomes Short Term: Continued assessment and intervention until BP is < 140/972mHG in  hypertensive participants. < 130/8055mG in hypertensive participants with diabetes, heart failure or chronic kidney disease.;Long Term: Maintenance of blood pressure at goal levels.    Lipids Yes    Intervention Provide education and support for participant on nutrition & aerobic/resistive exercise along with prescribed medications to achieve LDL <65m28mDL >40mg34m Expected Outcomes Short Term: Participant states understanding of desired cholesterol values and is compliant with medications prescribed. Participant is following exercise prescription and nutrition guidelines.;Long Term: Cholesterol controlled with medications as prescribed, with individualized exercise RX and with personalized nutrition plan. Value goals: LDL < 65mg,43m > 40 mg.             Tobacco Use Initial Evaluation: Social History   Tobacco Use  Smoking Status Former   Packs/day: 1.00   Years: 48.00   Total pack years: 48.00   Types: Cigarettes   Quit date: 11/07/2015   Years since quitting: 5.8  Smokeless Tobacco Never  Tobacco Comments   quit smoking 11/01/15    Exercise Goals and Review:  Exercise Goals     Row Name 09/29/21 1348             Exercise Goals   Increase Physical Activity Yes  Intervention Provide advice, education, support and counseling about physical activity/exercise needs.;Develop an individualized exercise prescription for aerobic and resistive training based on initial evaluation findings, risk stratification, comorbidities and participant's personal goals.       Expected Outcomes Short Term: Attend rehab on a regular basis to increase amount of physical activity.;Long Term: Add in home exercise to make exercise part of routine and to increase amount of physical activity.;Long Term: Exercising regularly at least 3-5 days a week.       Increase Strength and Stamina Yes       Intervention Provide advice, education, support and counseling about physical activity/exercise  needs.;Develop an individualized exercise prescription for aerobic and resistive training based on initial evaluation findings, risk stratification, comorbidities and participant's personal goals.       Expected Outcomes Short Term: Increase workloads from initial exercise prescription for resistance, speed, and METs.;Short Term: Perform resistance training exercises routinely during rehab and add in resistance training at home;Long Term: Improve cardiorespiratory fitness, muscular endurance and strength as measured by increased METs and functional capacity (6MWT)       Able to understand and use rate of perceived exertion (RPE) scale Yes       Intervention Provide education and explanation on how to use RPE scale       Expected Outcomes Short Term: Able to use RPE daily in rehab to express subjective intensity level       Able to understand and use Dyspnea scale Yes       Intervention Provide education and explanation on how to use Dyspnea scale       Expected Outcomes Short Term: Able to use Dyspnea scale daily in rehab to express subjective sense of shortness of breath during exertion;Long Term: Able to use Dyspnea scale to guide intensity level when exercising independently       Knowledge and understanding of Target Heart Rate Range (THRR) Yes       Intervention Provide education and explanation of THRR including how the numbers were predicted and where they are located for reference       Expected Outcomes Short Term: Able to state/look up THRR;Short Term: Able to use daily as guideline for intensity in rehab;Long Term: Able to use THRR to govern intensity when exercising independently       Able to check pulse independently Yes       Intervention Provide education and demonstration on how to check pulse in carotid and radial arteries.;Review the importance of being able to check your own pulse for safety during independent exercise       Expected Outcomes Long Term: Able to check pulse  independently and accurately;Short Term: Able to explain why pulse checking is important during independent exercise       Understanding of Exercise Prescription Yes       Intervention Provide education, explanation, and written materials on patient's individual exercise prescription       Expected Outcomes Short Term: Able to explain program exercise prescription;Long Term: Able to explain home exercise prescription to exercise independently                Copy of goals given to participant.

## 2021-09-29 NOTE — Progress Notes (Signed)
Dr. Lanney Gins,  Brian Casey came in for his 6MWT for pulmonary rehab today.  He desaturated down to 78% at 2:24.  He initially dropped to 88% within the first minute and continued to drop while walking on room air.  Once seated he did drop to 75%.  6MWT results at bottom.   Once talking after test, he mentioned that over weekend he had crawl inside after working in his garden due to SOB.  When he was able to make it inside to his wife, she told him that his lips were blue.  He did not check his oxygen saturations but sounds like they were definitely too low.  We talked about how oxygen during exertion would help keep him from dropping too low.   He was agreeable.  He would benefit from oxygen use for exertion and we would like to use it in rehab.   Please sign and complete order for oxygen use during rehab.    Thank you for your assistance! [name], MA, RCEP, CCRP 09/29/2021 12:05 PM   Pt able to use 2-4L for exercise in pulmonary rehab.   Signature:________________________________________ Date: _______________ Time:____________________    09/29/21 1200  6 Minute Walk  Phase Initial  Distance 420 feet  Walk Time 2.4 minutes (stopped at 2:24 due to desaturation to 78%)  # of Rest Breaks 0  MPH 1.98  METS 1.26  RPE 13  Perceived Dyspnea  3  VO2 Peak 4.41  Symptoms Yes (comment)  Comments SOB, desaturation (75% lowest)  Resting HR 66 bpm  Resting BP 126/64  Resting Oxygen Saturation  95 %  Exercise Oxygen Saturation  during 6 min walk 78 % (once seated dropped as low as 75%)  Max Ex. HR 93 bpm  Max Ex. BP 152/70  2 Minute Post BP 128/64  Interval HR  Interval Heart Rate? Yes  1 Minute HR 85  2 Minute HR 92  3 Minute HR 93  2 Minute Post HR 69  Interval Oxygen  Interval Oxygen? Yes  Baseline Oxygen Saturation % 95 %  1 Minute Oxygen Saturation % 88 %  1 Minute Liters of Oxygen 0 L (Room Air)  2 Minute Oxygen Saturation % 82 %  2 Minute Liters of Oxygen 0 L  3  Minute Oxygen Saturation % 78 % (at 2:24 75% once seated)  3 Minute Liters of Oxygen 0 L  2 Minute Post Oxygen Saturation % 89 %  2 Minute Post Liters of Oxygen 0 L

## 2021-09-29 NOTE — Progress Notes (Signed)
Pulmonary Individual Treatment Plan  Patient Details  Name: Brian Casey MRN: [id number] Date of Birth: April 12, 1950 Referring Provider:   April Manson Pulmonary Rehab from 09/29/2021 in [facility] Cardiac and Pulmonary Rehab  Referring Provider Cyndie Chime MD ([place])  [facility] Pulmonologist Dr. Lanney Gins       Initial Encounter Date:  Flowsheet Row Pulmonary Rehab from 09/29/2021 in [facility] Cardiac and Pulmonary Rehab  Date 09/29/21       Visit Diagnosis: ILD (interstitial lung disease) ([place])  Patient's Home Medications on Admission:  Current Outpatient Medications:    albuterol (VENTOLIN HFA) 108 (90 Base) MCG/ACT inhaler, SMARTSIG:2 inhalation Via Inhaler Every 4 Hours PRN, Disp: , Rfl:    ALPRAZolam (XANAX) 0.25 MG tablet, Take by mouth., Disp: , Rfl:    atorvastatin (LIPITOR) 40 MG tablet, Take 40 mg by mouth daily., Disp: , Rfl:    azaTHIOprine (IMURAN) 50 MG tablet, Take 50 mg by mouth daily., Disp: , Rfl:    budesonide (PULMICORT) 0.25 MG/2ML nebulizer solution, INHALE CONTENTS OF ONE VIAL/NEBULE VIA NEBULIZER TWO TIMES A DAY, Disp: , Rfl:    carvedilol (COREG) 6.25 MG tablet, Take 1 tablet (6.25 mg total) by mouth 2 (two) times daily., Disp: 60 tablet, Rfl: 2   chlorpheniramine-HYDROcodone (TUSSIONEX) 10-8 MG/5ML SUER, Take 5 mLs by mouth every 12 (twelve) hours., Disp: 115 mL, Rfl: 0   furosemide (LASIX) 20 MG tablet, Take 20 mg by mouth daily., Disp: , Rfl:    ipratropium-albuterol (DUONEB) 0.5-2.5 (3) MG/3ML SOLN, Take by nebulization., Disp: , Rfl:    levothyroxine (SYNTHROID, LEVOTHROID) 100 MCG tablet, Take 1 tablet (100 mcg total) by mouth daily before breakfast. OFFICE VISIT WITH  LABS REQUIRED FOR ADDITIONAL REFILLS (Patient taking differently: Take 112 mcg by mouth daily before breakfast. OFFICE VISIT WITH  LABS REQUIRED FOR ADDITIONAL REFILLS), Disp: 30 tablet, Rfl: 0   lisinopril (PRINIVIL,ZESTRIL) 10 MG tablet, Take 1 tablet (10 mg total) by mouth daily. (Patient not  taking: Reported on 09/12/2021), Disp: 30 tablet, Rfl: 2   omeprazole (PRILOSEC) 20 MG capsule, Take 1 capsule (20 mg total) by mouth daily. OFFICE VISIT REQUIRED FOR ADDITIONAL REFILLS, Disp: 30 capsule, Rfl: 0   predniSONE (DELTASONE) 10 MG tablet, Take 10 mg by mouth daily., Disp: , Rfl:    tadalafil (CIALIS) 5 MG tablet, Take 1 tablet by mouth as needed., Disp: , Rfl:   Past Medical History: Past Medical History:  Diagnosis Date   Acute renal failure ([place]) 11/06/2015   [name] 11/06/2015   Adenomatous colon polyp    Anxiety    Arthritis    "lower spine; fingers" (11/07/2015)   Depression    Diverticulosis    GERD (gastroesophageal reflux disease)    High cholesterol    History of bronchitis    Hypertension 10/17/2013   Hyperthyroidism    "had it radiated in his '58s"   Hypothyroidism    New onset atrial fibrillation ([place]) 11/06/2015   [name] 11/07/2015   NSTEMI (non-ST elevated myocardial infarction) ([place]) 11/07/2015   Thyroid disease    Type II diabetes mellitus ([place])    type II   Wears glasses     Tobacco Use: Social History   Tobacco Use  Smoking Status Former   Packs/day: 1.00   Years: 48.00   Total pack years: 48.00   Types: Cigarettes   Quit date: 11/07/2015   Years since quitting: 5.8  Smokeless Tobacco Never  Tobacco Comments   quit smoking 11/01/15    Labs: Review Flowsheet  More data exists      Latest Ref Rng & Units 11/09/2015 11/13/2015 11/14/2015 11/15/2015  Labs for ITP Cardiac and Pulmonary Rehab  Trlycerides <150 mg/dL 437  - - -  Hemoglobin A1c 4.8 - 5.6 % - 7.1  - -  PH, Arterial 7.350 - 7.450 - 7.482  7.361  7.308  7.366  7.421  7.352  7.361   PCO2 arterial 35.0 - 45.0 mmHg - 32.2  40.3  52.3  38.2  39.5  38.4  39.3   Bicarbonate 20.0 - 24.0 mEq/L - 23.8  23.0  26.3  21.9  25.7  21.4  22.5   TCO2 0 - 100 mmol/L - 24._0 Acid-base deficit 0.0 - 2.0 mmol/L - - 2.0  3.0  4.0  3.0   O2  Saturation % - 97.1  97.0  100.0  99.0  100.0  61.7  95.0  98.0       01/15/2016  Labs for ITP Cardiac and Pulmonary Rehab  Trlycerides -  Hemoglobin A1c 5.5   PH, Arterial -  PCO2 arterial -  Bicarbonate -  TCO2 -  Acid-base deficit -  O2 Saturation -     Pulmonary Assessment Scores:  Pulmonary Assessment Scores     Row Name 09/29/21 1351         ADL UCSD   ADL Phase Entry     SOB Score total 59     Rest 1     Walk 3     Stairs 5     Bath 2     Dress 2     Shop 2       CAT Score   CAT Score 23       mMRC Score   mMRC Score 2              UCSD: Self-administered rating of dyspnea associated with activities of daily living (ADLs) 6-point scale (0 = "not at all" to 5 = "maximal or unable to do because of breathlessness")  Scoring Scores range from 0 to 120.  Minimally important difference is 5 units  CAT: CAT can identify the health impairment of COPD patients and is better correlated with disease progression.  CAT has a scoring range of zero to 40. The CAT score is classified into four groups of low (less than 10), medium (10 - 20), high (21-30) and very high (31-40) based on the impact level of disease on health status. A CAT score over 10 suggests significant symptoms.  A worsening CAT score could be explained by an exacerbation, poor medication adherence, poor inhaler technique, or progression of COPD or comorbid conditions.  CAT MCID is 2 points  mMRC: mMRC (Modified Medical Research Council) Dyspnea Scale is used to assess the degree of baseline functional disability in patients of respiratory disease due to dyspnea. No minimal important difference is established. A decrease in score of 1 point or greater is considered a positive change.   Pulmonary Function Assessment:  Pulmonary Function Assessment - 09/29/21 1351       Breath   Shortness of Breath Yes;Fear of Shortness of Breath;Limiting activity             Exercise Target  Goals: Exercise Program Goal: Individual exercise prescription set using results from initial 6  min walk test and THRR while considering  patient's activity barriers and safety.   Exercise Prescription Goal: Initial exercise prescription builds to 30-45 minutes a day of aerobic activity, 2-3 days per week.  Home exercise guidelines will be given to patient during program as part of exercise prescription that the participant will acknowledge.  Education: Aerobic Exercise: - Group verbal and visual presentation on the components of exercise prescription. Introduces F.I.T.T principle from ACSM for exercise prescriptions.  Reviews F.I.T.T. principles of aerobic exercise including progression. Written material given at graduation. Flowsheet Row Cardiac Rehab from 04/01/2016 in [facility] Cardiac and Pulmonary Rehab  Date 02/17/16  Educator [facility]  Instruction Review Code (retired) 2- meets goals/outcomes       Education: Resistance Exercise: - Group verbal and visual presentation on the components of exercise prescription. Introduces F.I.T.T principle from ACSM for exercise prescriptions  Reviews F.I.T.T. principles of resistance exercise including progression. Written material given at graduation.    Education: Exercise & Equipment Safety: - Individual verbal instruction and demonstration of equipment use and safety with use of the equipment. Flowsheet Row Pulmonary Rehab from 09/29/2021 in [facility] Cardiac and Pulmonary Rehab  Date 09/29/21  Educator [facility]  Instruction Review Code 1- Verbalizes Understanding       Education: Exercise Physiology & General Exercise Guidelines: - Group verbal and written instruction with models to review the exercise physiology of the cardiovascular system and associated critical values. Provides general exercise guidelines with specific guidelines to those with heart or lung disease.    Education: Flexibility, Balance, Mind/Body Relaxation: - Group verbal and visual  presentation with interactive activity on the components of exercise prescription. Introduces F.I.T.T principle from ACSM for exercise prescriptions. Reviews F.I.T.T. principles of flexibility and balance exercise training including progression. Also discusses the mind body connection.  Reviews various relaxation techniques to help reduce and manage stress (i.e. Deep breathing, progressive muscle relaxation, and visualization). Balance handout provided to take home. Written material given at graduation. Flowsheet Row Cardiac Rehab from 04/01/2016 in [facility] Cardiac and Pulmonary Rehab  Date 02/19/16  Educator [facility]  Instruction Review Code (retired) 2- meets goals/outcomes       Activity Barriers & Risk Stratification:  Activity Barriers & Cardiac Risk Stratification - 09/29/21 1330       Activity Barriers & Cardiac Risk Stratification   Activity Barriers Shortness of Breath;Deconditioning;Muscular Weakness;Balance Concerns             6 Minute Walk:  6 Minute Walk     Row Name 09/29/21 1200         6 Minute Walk   Phase Initial     Distance 420 feet     Walk Time 2.4 minutes  stopped at 2:24 due to desaturation to 78%     # of Rest Breaks 0     MPH 1.98     METS 1.26     RPE 13     Perceived Dyspnea  3     VO2 Peak 4.41     Symptoms Yes (comment)     Comments SOB, desaturation (75% lowest)     Resting HR 66 bpm     Resting BP 126/64     Resting Oxygen Saturation  95 %     Exercise Oxygen Saturation  during 6 min walk 78 %  once seated dropped as low as 75%     Max Ex. HR 93 bpm     Max Ex. BP 152/70     2 Minute  Post BP 128/64       Interval HR   1 Minute HR 85     2 Minute HR 92     3 Minute HR 93     2 Minute Post HR 69     Interval Heart Rate? Yes       Interval Oxygen   Interval Oxygen? Yes     Baseline Oxygen Saturation % 95 %     1 Minute Oxygen Saturation % 88 %     1 Minute Liters of Oxygen 0 L  Room Air     2 Minute Oxygen Saturation % 82 %     2  Minute Liters of Oxygen 0 L     3 Minute Oxygen Saturation % 78 %  at 2:24 75% once seated     3 Minute Liters of Oxygen 0 L     2 Minute Post Oxygen Saturation % 89 %     2 Minute Post Liters of Oxygen 0 L             Oxygen Initial Assessment:  Oxygen Initial Assessment - 09/29/21 1350       Home Oxygen   Home Oxygen Device None    Sleep Oxygen Prescription None    Home Exercise Oxygen Prescription None    Home Resting Oxygen Prescription None    Compliance with Home Oxygen Use Yes      Initial 6 min Walk   Oxygen Used None      Program Oxygen Prescription   Program Oxygen Prescription Continuous;E-Tanks    Liters per minute 2    Comments order sent to MD for continous use      Intervention   Short Term Goals To learn and demonstrate proper pursed lip breathing techniques or other breathing techniques. ;To learn and understand importance of monitoring SPO2 with pulse oximeter and demonstrate accurate use of the pulse oximeter.;To learn and demonstrate proper use of respiratory medications;To learn and exhibit compliance with exercise, home and travel O2 prescription;To learn and understand importance of maintaining oxygen saturations>88%    Long  Term Goals Exhibits proper breathing techniques, such as pursed lip breathing or other method taught during program session;Demonstrates proper use of MDI's;Compliance with respiratory medication;Exhibits compliance with exercise, home  and travel O2 prescription;Verbalizes importance of monitoring SPO2 with pulse oximeter and return demonstration;Maintenance of O2 saturations>88%             Oxygen Re-Evaluation:   Oxygen Discharge (Final Oxygen Re-Evaluation):   Initial Exercise Prescription:  Initial Exercise Prescription - 09/29/21 1300       Date of Initial Exercise RX and Referring Provider   Date 09/29/21    Referring Provider Cyndie Chime MD ([place])   Primary Pulmonologist Dr. Lanney Gins     Oxygen   Oxygen  Continuous    Liters 2    Maintain Oxygen Saturation 88% or higher      REL-XR   Level 1    Speed 50    Minutes 15    METs 2      Biostep-RELP   Level 2    SPM 50    Minutes 15    METs 2      Track   Laps 25    Minutes 15    METs 2.36      Prescription Details   Frequency (times per week) 3    Duration Progress to 30 minutes of continuous aerobic without signs/symptoms of physical distress  Intensity   THRR 40-80% of Max Heartrate 100-134    Ratings of Perceived Exertion 11-13    Perceived Dyspnea 0-4      Progression   Progression Continue to progress workloads to maintain intensity without signs/symptoms of physical distress.      Resistance Training   Training Prescription Yes    Weight 3 lb    Reps 10-15             Perform Capillary Blood Glucose checks as needed.  Exercise Prescription Changes:   Exercise Prescription Changes     Row Name 09/29/21 1300             Response to Exercise   Blood Pressure (Admit) 126/64       Blood Pressure (Exercise) 152/70       Blood Pressure (Exit) 162/70  rck 142/74       Heart Rate (Admit) 66 bpm       Heart Rate (Exercise) 93 bpm       Heart Rate (Exit) 68 bpm       Oxygen Saturation (Admit) 95 %       Oxygen Saturation (Exercise) 75 %       Oxygen Saturation (Exit) 92 %       Rating of Perceived Exertion (Exercise) 13       Perceived Dyspnea (Exercise) 3       Symptoms SOB, desaturation       Comments walk test results                Exercise Comments:   Exercise Goals and Review:   Exercise Goals     Row Name 09/29/21 1348             Exercise Goals   Increase Physical Activity Yes       Intervention Provide advice, education, support and counseling about physical activity/exercise needs.;Develop an individualized exercise prescription for aerobic and resistive training based on initial evaluation findings, risk stratification, comorbidities and participant's personal goals.        Expected Outcomes Short Term: Attend rehab on a regular basis to increase amount of physical activity.;Long Term: Add in home exercise to make exercise part of routine and to increase amount of physical activity.;Long Term: Exercising regularly at least 3-5 days a week.       Increase Strength and Stamina Yes       Intervention Provide advice, education, support and counseling about physical activity/exercise needs.;Develop an individualized exercise prescription for aerobic and resistive training based on initial evaluation findings, risk stratification, comorbidities and participant's personal goals.       Expected Outcomes Short Term: Increase workloads from initial exercise prescription for resistance, speed, and METs.;Short Term: Perform resistance training exercises routinely during rehab and add in resistance training at home;Long Term: Improve cardiorespiratory fitness, muscular endurance and strength as measured by increased METs and functional capacity (6MWT)       Able to understand and use rate of perceived exertion (RPE) scale Yes       Intervention Provide education and explanation on how to use RPE scale       Expected Outcomes Short Term: Able to use RPE daily in rehab to express subjective intensity level       Able to understand and use Dyspnea scale Yes       Intervention Provide education and explanation on how to use Dyspnea scale       Expected Outcomes Short Term: Able to use  Dyspnea scale daily in rehab to express subjective sense of shortness of breath during exertion;Long Term: Able to use Dyspnea scale to guide intensity level when exercising independently       Knowledge and understanding of Target Heart Rate Range (THRR) Yes       Intervention Provide education and explanation of THRR including how the numbers were predicted and where they are located for reference       Expected Outcomes Short Term: Able to state/look up THRR;Short Term: Able to use daily as guideline  for intensity in rehab;Long Term: Able to use THRR to govern intensity when exercising independently       Able to check pulse independently Yes       Intervention Provide education and demonstration on how to check pulse in carotid and radial arteries.;Review the importance of being able to check your own pulse for safety during independent exercise       Expected Outcomes Long Term: Able to check pulse independently and accurately;Short Term: Able to explain why pulse checking is important during independent exercise       Understanding of Exercise Prescription Yes       Intervention Provide education, explanation, and written materials on patient's individual exercise prescription       Expected Outcomes Short Term: Able to explain program exercise prescription;Long Term: Able to explain home exercise prescription to exercise independently                Exercise Goals Re-Evaluation :   Discharge Exercise Prescription (Final Exercise Prescription Changes):  Exercise Prescription Changes - 09/29/21 1300       Response to Exercise   Blood Pressure (Admit) 126/64    Blood Pressure (Exercise) 152/70    Blood Pressure (Exit) 162/70   rck 142/74   Heart Rate (Admit) 66 bpm    Heart Rate (Exercise) 93 bpm    Heart Rate (Exit) 68 bpm    Oxygen Saturation (Admit) 95 %    Oxygen Saturation (Exercise) 75 %    Oxygen Saturation (Exit) 92 %    Rating of Perceived Exertion (Exercise) 13    Perceived Dyspnea (Exercise) 3    Symptoms SOB, desaturation    Comments walk test results             Nutrition:  Target Goals: Understanding of nutrition guidelines, daily intake of sodium <1515m, cholesterol <2041m calories 30% from fat and 7% or less from saturated fats, daily to have 5 or more servings of fruits and vegetables.  Education: All About Nutrition: -Group instruction provided by verbal, written material, interactive activities, discussions, models, and posters to present  general guidelines for heart healthy nutrition including fat, fiber, MyPlate, the role of sodium in heart healthy nutrition, utilization of the nutrition label, and utilization of this knowledge for meal planning. Follow up email sent as well. Written material given at graduation. Flowsheet [facility]  Education need identified 09/29/21       Biometrics:  Pre Biometrics - 09/29/21 1348       Pre Biometrics   Height 5' 7.6" (1.717 m)    Weight 206 lb (93.4 kg)    BMI (Calculated) 31.7    Single Leg Stand 2.8 seconds              Nutrition Therapy Plan and Nutrition Goals:  Nutrition Therapy & Goals - 09/29/21 1349       Intervention Plan  Intervention Prescribe, educate and counsel regarding individualized specific dietary modifications aiming towards targeted core components such as weight, hypertension, lipid management, diabetes, heart failure and other comorbidities.    Expected Outcomes Short Term Goal: Understand basic principles of dietary content, such as calories, fat, sodium, cholesterol and nutrients.;Short Term Goal: A plan has been developed with personal nutrition goals set during dietitian appointment.;Long Term Goal: Adherence to prescribed nutrition plan.             Nutrition Assessments:  MEDIFICTS Score Key: ?70 Need to make dietary changes  40-70 Heart Healthy Diet ? 40 Therapeutic Level Cholesterol Diet  Flowsheet Row Pulmonary Rehab from 09/29/2021 in [facility] Cardiac and Pulmonary Rehab  Picture Your Plate Total Score on Admission 50      Picture Your Plate Scores: <75 Unhealthy dietary pattern with much room for improvement. 41-50 Dietary pattern unlikely to meet recommendations for good health and room for improvement. 51-60 More healthful dietary pattern, with some room for improvement.  >60 Healthy dietary pattern, although there may be some specific behaviors that could be  improved.   Nutrition Goals Re-Evaluation:   Nutrition Goals Discharge (Final Nutrition Goals Re-Evaluation):   Psychosocial: Target Goals: Acknowledge presence or absence of significant depression and/or stress, maximize coping skills, provide positive support system. Participant is able to verbalize types and ability to use techniques and skills needed for reducing stress and depression.   Education: Stress, Anxiety, and Depression - Group verbal and visual presentation to define topics covered.  Reviews how body is impacted by stress, anxiety, and depression.  Also discusses healthy ways to reduce stress and to treat/manage anxiety and depression.  Written material given at graduation. Flowsheet Row Cardiac Rehab from 04/01/2016 in [facility] Cardiac and Pulmonary Rehab  Date 04/01/16  Educator [facility]  Instruction Review Code (retired) 2- meets goals/outcomes       Education: Sleep Hygiene -Provides group verbal and written instruction about how sleep can affect your health.  Define sleep hygiene, discuss sleep cycles and impact of sleep habits. Review good sleep hygiene tips.    Initial Review & Psychosocial Screening:  Initial Psych Review & Screening - 09/12/21 1421       Initial Review   Current issues with Current Sleep Concerns;Current Psychotropic Meds;Current Stress Concerns    Source of Stress Concerns Chronic Illness;Unable to participate in former interests or hobbies    Comments THis illness is not me. I can not do what I used to do in the past. I am stopped now from participating in what I used to do.      Family Dynamics   Good Support System? Yes   wife, children daughter and son     Barriers   Psychosocial barriers to participate in program There are no identifiable barriers or psychosocial needs.      Screening Interventions   Interventions Encouraged to exercise    Expected Outcomes Short Term goal: Utilizing psychosocial counselor, staff and physician to assist  with identification of specific Stressors or current issues interfering with healing process. Setting desired goal for each stressor or current issue identified.;Long Term Goal: Stressors or current issues are controlled or eliminated.;Short Term goal: Identification and review with participant of any Quality of Life or Depression concerns found by scoring the questionnaire.;Long Term goal: The participant improves quality of Life and PHQ9 Scores as seen by post scores and/or verbalization of changes             Quality of Life Scores:  Scores of 19 and below usually indicate a poorer quality of life in these areas.  A difference of  2-3 points is a clinically meaningful difference.  A difference of 2-3 points in the total score of the Quality of Life Index has been associated with significant improvement in overall quality of life, self-image, physical symptoms, and general health in studies assessing change in quality of life.  PHQ-9: Review Flowsheet       09/29/2021 03/25/2016 02/10/2016  Depression screen PHQ 2/9  Decreased Interest 0 0 1  Down, Depressed, Hopeless 1 0 0  PHQ - 2 Score 1 0 1  Altered sleeping 3 1 0  Tired, decreased energy 2 0 0  Change in appetite 1 0 1  Feeling bad or failure about yourself  1 0 1  Trouble concentrating 0 0 0  Moving slowly or fidgety/restless 0 0 0  Suicidal thoughts 0 0 0  PHQ-9 Score _0 Difficult doing work/chores Somewhat difficult Not difficult at all Not difficult at all   Interpretation of Total Score  Total Score Depression Severity:  1-4 = Minimal depression, 5-9 = Mild depression, 10-14 = Moderate depression, 15-19 = Moderately severe depression, 20-27 = Severe depression   Psychosocial Evaluation and Intervention:  Psychosocial Evaluation - 09/12/21 1428       Psychosocial Evaluation & Interventions   Interventions Encouraged to exercise with the program and follow exercise prescription    Comments Jeremiah has no barriers to  attending the program. He lives with his wife and she is his "advocate". She is with him at all his appointments. THey have a daughter and son that are support too.  Shamond wants to be able to be more active with less shortness of breath. He is streeesd some over the fact he is not able to participate in activities that he enjoys.  He is ready to get started with the program.    Expected Outcomes STG Mamadou is able to attend all scheduled sessions, he is able to learn and use breathing techniques and mange his pulmonary meds to help decrease his symptoms. He has been in the Cardiac Rehab program in the past. He has quit tobacco, 2017, and has no desire to start using any form of tobacco.  He is ready to get started.    Continue Psychosocial Services  Follow up required by staff             Psychosocial Re-Evaluation:   Psychosocial Discharge (Final Psychosocial Re-Evaluation):   Education: Education Goals: Education classes will be provided on a weekly basis, covering required topics. Participant will state understanding/return demonstration of topics presented.  Learning Barriers/Preferences:   General Pulmonary Education Topics:  Infection Prevention: - Provides verbal and written material to individual with discussion of infection control including proper hand washing and proper equipment cleaning during exercise session. Flowsheet Row Pulmonary Rehab from 09/29/2021 in [facility] Cardiac and Pulmonary Rehab  Date 09/29/21  Educator [facility]  Instruction Review Code 1- Verbalizes Understanding       Falls Prevention: - Provides verbal and written material to individual with discussion of falls prevention and safety. Flowsheet Row Pulmonary Rehab from 09/29/2021 in [facility] Cardiac and Pulmonary Rehab  Date 09/12/21  Educator SB  Instruction Review Code 1- Verbalizes Understanding       Chronic Lung Disease Review: - Group verbal instruction with posters, models, PowerPoint presentations  and videos,  to review new updates, new respiratory medications, new advancements in procedures and treatments.  Providing information on websites and "800" numbers for continued self-education. Includes information about supplement oxygen, available portable oxygen systems, continuous and intermittent flow rates, oxygen safety, concentrators, and Medicare reimbursement for oxygen. Explanation of Pulmonary Drugs, including class, frequency, complications, importance of spacers, rinsing mouth after steroid MDI's, and proper cleaning methods for nebulizers. Review of basic lung anatomy and physiology related to function, structure, and complications of lung disease. Review of risk factors. Discussion about methods for diagnosing sleep apnea and types of masks and machines for OSA. Includes a review of the use of types of environmental controls: home humidity, furnaces, filters, dust mite/pet prevention, HEPA vacuums. Discussion about weather changes, air quality and the benefits of nasal washing. Instruction on Warning signs, infection symptoms, calling MD promptly, preventive modes, and value of vaccinations. Review of effective airway clearance, coughing and/or vibration techniques. Emphasizing that all should Create an Action Plan. Written material given at graduation. Flowsheet Row Pulmonary Rehab from 09/29/2021 in [facility] Cardiac and Pulmonary Rehab  Education need identified 09/29/21       AED/CPR: - Group verbal and written instruction with the use of models to demonstrate the basic use of the AED with the basic ABC's of resuscitation.    Anatomy and Cardiac Procedures: - Group verbal and visual presentation and models provide information about basic cardiac anatomy and function. Reviews the testing methods done to diagnose heart disease and the outcomes of the test results. Describes the treatment choices: Medical Management, Angioplasty, or Coronary Bypass Surgery for treating various heart  conditions including Myocardial Infarction, Angina, Valve Disease, and Cardiac Arrhythmias.  Written material given at graduation. Flowsheet Row Cardiac Rehab from 04/01/2016 in [facility] Cardiac and Pulmonary Rehab  Date 02/24/16  Educator TS  Instruction Review Code (retired) 2- meets goals/outcomes       Medication Safety: - Group verbal and visual instruction to review commonly prescribed medications for heart and lung disease. Reviews the medication, class of the drug, and side effects. Includes the steps to properly store meds and maintain the prescription regimen.  Written material given at graduation. Flowsheet Row Cardiac Rehab from 04/01/2016 in [facility] Cardiac and Pulmonary Rehab  Date 03/11/16  Marisue Humble 2]  Educator TS  Instruction Review Code (retired) 2- meets goals/outcomes       Other: -Provides group and verbal instruction on various topics (see comments)   Knowledge Questionnaire Score:  Knowledge Questionnaire Score - 09/29/21 1349       Knowledge Questionnaire Score   Pre Score 16/18              Core Components/Risk Factors/Patient Goals at Admission:  Personal Goals and Risk Factors at Admission - 09/29/21 1349       Core Components/Risk Factors/Patient Goals on Admission    Weight Management Yes;Weight Loss;Obesity    Intervention Weight Management: Develop a combined nutrition and exercise program designed to reach desired caloric intake, while maintaining appropriate intake of nutrient and fiber, sodium and fats, and appropriate energy expenditure required for the weight goal.;Weight Management: Provide education and appropriate resources to help participant work on and attain dietary goals.;Weight Management/Obesity: Establish reasonable short term and long term weight goals.;Obesity: Provide education and appropriate resources to help participant work on and attain dietary goals.    Admit Weight 206 lb (93.4 kg)    Goal Weight: Short Term 200 lb (90.7 kg)     Goal Weight: Long Term 195 lb (88.5 kg)    Expected Outcomes Short Term: Continue to assess and modify interventions  until short term weight is achieved;Weight Loss: Understanding of general recommendations for a balanced deficit meal plan, which promotes 1-2 lb weight loss per week and includes a negative energy balance of [phone] kcal/d;Understanding recommendations for meals to include 15-35% energy as protein, 25-35% energy from fat, 35-60% energy from carbohydrates, less than 227m of dietary cholesterol, 20-35 gm of total fiber daily;Understanding of distribution of calorie intake throughout the day with the consumption of 4-5 meals/snacks;Long Term: Adherence to nutrition and physical activity/exercise program aimed toward attainment of established weight goal    Improve shortness of breath with ADL's Yes    Intervention Provide education, individualized exercise plan and daily activity instruction to help decrease symptoms of SOB with activities of daily living.    Expected Outcomes Short Term: Improve cardiorespiratory fitness to achieve a reduction of symptoms when performing ADLs;Long Term: Be able to perform more ADLs without symptoms or delay the onset of symptoms    Increase knowledge of respiratory medications and ability to use respiratory devices properly  Yes    Intervention Provide education and demonstration as needed of appropriate use of medications, inhalers, and oxygen therapy.    Expected Outcomes Short Term: Achieves understanding of medications use. Understands that oxygen is a medication prescribed by physician. Demonstrates appropriate use of inhaler and oxygen therapy.;Long Term: Maintain appropriate use of medications, inhalers, and oxygen therapy.    Diabetes Yes    Intervention Provide education about signs/symptoms and action to take for hypo/hyperglycemia.;Provide education about proper nutrition, including hydration, and aerobic/resistive exercise prescription along with  prescribed medications to achieve blood glucose in normal ranges: Fasting glucose 65-99 mg/dL    Expected Outcomes Short Term: Participant verbalizes understanding of the signs/symptoms and immediate care of hyper/hypoglycemia, proper foot care and importance of medication, aerobic/resistive exercise and nutrition plan for blood glucose control.;Long Term: Attainment of HbA1C < 7%.    Hypertension Yes    Intervention Provide education on lifestyle modifcations including regular physical activity/exercise, weight management, moderate sodium restriction and increased consumption of fresh fruit, vegetables, and low fat dairy, alcohol moderation, and smoking cessation.;Monitor prescription use compliance.    Expected Outcomes Short Term: Continued assessment and intervention until BP is < 140/967mHG in hypertensive participants. < 130/8042mG in hypertensive participants with diabetes, heart failure or chronic kidney disease.;Long Term: Maintenance of blood pressure at goal levels.    Lipids Yes    Intervention Provide education and support for participant on nutrition & aerobic/resistive exercise along with prescribed medications to achieve LDL <44m68mDL >40mg28m Expected Outcomes Short Term: Participant states understanding of desired cholesterol values and is compliant with medications prescribed. Participant is following exercise prescription and nutrition guidelines.;Long Term: Cholesterol controlled with medications as prescribed, with individualized exercise RX and with personalized nutrition plan. Value goals: LDL < 44mg,71m > 40 mg.             Education:Diabetes - Individual verbal and written instruction to review signs/symptoms of diabetes, desired ranges of glucose level fasting, after meals and with exercise. Acknowledge that pre and post exercise glucose checks will be done for 3 sessions at entry of program. Flowsheet Row [facility]  Date 09/29/21  [facility] JH  [facility]- Verbal[facility] Understanding       Know Your Numbers and Heart Failure: - Group verbal and visual instruction to discuss disease risk factors for cardiac and pulmonary disease and treatment options.  Reviews associated  critical values for Overweight/Obesity, Hypertension, Cholesterol, and Diabetes.  Discusses basics of heart failure: signs/symptoms and treatments.  Introduces Heart Failure Zone chart for action plan for heart failure.  Written material given at graduation. Flowsheet Row Cardiac Rehab from 04/01/2016 in [facility] Cardiac and Pulmonary Rehab  Date 02/24/16  Educator TS  Instruction Review Code (retired) 2- meets goals/outcomes       Core Components/Risk Factors/Patient Goals Review:    Core Components/Risk Factors/Patient Goals at Discharge (Final Review):    ITP Comments:  ITP Comments     Row Name 09/12/21 1441 09/29/21 1158         ITP Comments Virtual orientation call completed today. he has an appointment on Date: 09/29/2021  for EP eval and gym Orientation.  Documentation of diagnosis can be found in [facility] Date: 09/05/2021 . Completed 6MWT and gym orientation. Initial ITP created and sent for review to Dr. Zetta Bills, Medical Director.               Comments: Initial ITP

## 2021-10-01 ENCOUNTER — Encounter: Payer: Self-pay | Admitting: *Deleted

## 2021-10-01 DIAGNOSIS — J849 Interstitial pulmonary disease, unspecified: Secondary | ICD-10-CM

## 2021-10-01 NOTE — Progress Notes (Signed)
Pulmonary Individual Treatment Plan  Patient Details  Name: Brian Casey MRN: [id number] Date of Birth: April 18, 1950 Referring Provider:   April Manson Pulmonary Rehab from 09/29/2021 in [facility] Cardiac and Pulmonary Rehab  Referring Provider Cyndie Chime [place] ([place])  [facility] Pulmonologist Dr. Lanney Gins       Initial Encounter Date:  Flowsheet Row Pulmonary Rehab from 09/29/2021 in [facility] Cardiac and Pulmonary Rehab  Date 09/29/21       Visit Diagnosis: ILD (interstitial lung disease) ([place])  Patient's Home Medications on Admission:  Current Outpatient Medications:    albuterol (VENTOLIN HFA) 108 (90 Base) MCG/ACT inhaler, SMARTSIG:2 inhalation Via Inhaler Every 4 Hours PRN, Disp: , Rfl:    ALPRAZolam (XANAX) 0.25 MG tablet, Take by mouth., Disp: , Rfl:    atorvastatin (LIPITOR) 40 MG tablet, Take 40 mg by mouth daily., Disp: , Rfl:    azaTHIOprine (IMURAN) 50 MG tablet, Take 50 mg by mouth daily., Disp: , Rfl:    budesonide (PULMICORT) 0.25 MG/2ML nebulizer solution, INHALE CONTENTS OF ONE VIAL/NEBULE VIA NEBULIZER TWO TIMES A DAY, Disp: , Rfl:    carvedilol (COREG) 6.25 MG tablet, Take 1 tablet (6.25 mg total) by mouth 2 (two) times daily., Disp: 60 tablet, Rfl: 2   chlorpheniramine-HYDROcodone (TUSSIONEX) 10-8 MG/5ML SUER, Take 5 mLs by mouth every 12 (twelve) hours., Disp: 115 mL, Rfl: 0   furosemide (LASIX) 20 MG tablet, Take 20 mg by mouth daily., Disp: , Rfl:    ipratropium-albuterol (DUONEB) 0.5-2.5 (3) MG/3ML SOLN, Take by nebulization., Disp: , Rfl:    levothyroxine (SYNTHROID, LEVOTHROID) 100 MCG tablet, Take 1 tablet (100 mcg total) by mouth daily before breakfast. OFFICE VISIT WITH  LABS REQUIRED FOR ADDITIONAL REFILLS (Patient taking differently: Take 112 mcg by mouth daily before breakfast. OFFICE VISIT WITH  LABS REQUIRED FOR ADDITIONAL REFILLS), Disp: 30 tablet, Rfl: 0   lisinopril (PRINIVIL,ZESTRIL) 10 MG tablet, Take 1 tablet (10 mg total) by mouth daily. (Patient not  taking: Reported on 09/12/2021), Disp: 30 tablet, Rfl: 2   omeprazole (PRILOSEC) 20 MG capsule, Take 1 capsule (20 mg total) by mouth daily. OFFICE VISIT REQUIRED FOR ADDITIONAL REFILLS, Disp: 30 capsule, Rfl: 0   predniSONE (DELTASONE) 10 MG tablet, Take 10 mg by mouth daily., Disp: , Rfl:    tadalafil (CIALIS) 5 MG tablet, Take 1 tablet by mouth as needed., Disp: , Rfl:   Past Medical History: Past Medical History:  Diagnosis Date   Acute renal failure ([place]) 11/06/2015   [name] 11/06/2015   Adenomatous colon polyp    Anxiety    Arthritis    "lower spine; fingers" (11/07/2015)   Depression    Diverticulosis    GERD (gastroesophageal reflux disease)    High cholesterol    History of bronchitis    Hypertension 10/17/2013   Hyperthyroidism    "had it radiated in his '44s"   Hypothyroidism    New onset atrial fibrillation ([place]) 11/06/2015   [name] 11/07/2015   NSTEMI (non-ST elevated myocardial infarction) ([place]) 11/07/2015   Thyroid disease    Type II diabetes mellitus ([place])    type II   Wears glasses     Tobacco Use: Social History   Tobacco Use  Smoking Status Former   Packs/day: 1.00   Years: 48.00   Total pack years: 48.00   Types: Cigarettes   Quit date: 11/07/2015   Years since quitting: 5.9  Smokeless Tobacco Never  Tobacco Comments   quit smoking 11/01/15    Labs: Review Flowsheet  More data exists      Latest Ref Rng & Units 11/09/2015 11/13/2015 11/14/2015 11/15/2015 01/15/2016  Labs for ITP Cardiac and Pulmonary Rehab  Trlycerides <150 mg/dL 437  - - - -  Hemoglobin A1c 4.8 - 5.6 % - 7.1  - - 5.5   PH, Arterial 7.350 - 7.450 - 7.482  7.361  7.308  7.366  7.421  7.352  7.361  -  PCO2 arterial 35.0 - 45.0 mmHg - 32.2  40.3  52.3  38.2  39.5  38.4  39.3  -  Bicarbonate 20.0 - 24.0 mEq/L - 23.8  23.0  26.3  21.9  25.7  21.4  22.5  -  TCO2 0 - 100 mmol/L - 24._0 -  Acid-base deficit 0.0 - 2.0 mmol/L -  - 2.0  3.0  4.0  3.0  -  O2 Saturation % - 97.1  97.0  100.0  99.0  100.0  61.7  95.0  98.0  -     Pulmonary Assessment Scores:  Pulmonary Assessment Scores     Row Name 09/29/21 1351         ADL UCSD   ADL Phase Entry     SOB Score total 59     Rest 1     Walk 3     Stairs 5     Bath 2     Dress 2     Shop 2       CAT Score   CAT Score 23       mMRC Score   mMRC Score 2              UCSD: Self-administered rating of dyspnea associated with activities of daily living (ADLs) 6-point scale (0 = "not at all" to 5 = "maximal or unable to do because of breathlessness")  Scoring Scores range from 0 to 120.  Minimally important difference is 5 units  CAT: CAT can identify the health impairment of COPD patients and is better correlated with disease progression.  CAT has a scoring range of zero to 40. The CAT score is classified into four groups of low (less than 10), medium (10 - 20), high (21-30) and very high (31-40) based on the impact level of disease on health status. A CAT score over 10 suggests significant symptoms.  A worsening CAT score could be explained by an exacerbation, poor medication adherence, poor inhaler technique, or progression of COPD or comorbid conditions.  CAT MCID is 2 points  mMRC: mMRC (Modified Medical Research Council) Dyspnea Scale is used to assess the degree of baseline functional disability in patients of respiratory disease due to dyspnea. No minimal important difference is established. A decrease in score of 1 point or greater is considered a positive change.   Pulmonary Function Assessment:  Pulmonary Function Assessment - 09/29/21 1351       Breath   Shortness of Breath Yes;Fear of Shortness of Breath;Limiting activity             Exercise Target Goals: Exercise Program Goal: Individual exercise prescription set using results from initial 6 min walk test and THRR while considering  patient's activity barriers and safety.    Exercise Prescription Goal: Initial exercise prescription builds to 30-45 minutes a day of aerobic activity, 2-3 days per week.  Home exercise guidelines will be given to patient during program as part of exercise prescription that the participant will acknowledge.  Education: Aerobic Exercise: - Group verbal and visual presentation on the components of exercise prescription. Introduces F.I.T.T principle from ACSM for exercise prescriptions.  Reviews F.I.T.T. principles of aerobic exercise including progression. Written material given at graduation. Flowsheet Row Cardiac Rehab from 04/01/2016 in [facility] Cardiac and Pulmonary Rehab  Date 02/17/16  Educator [facility]  Instruction Review Code (retired) 2- meets goals/outcomes       Education: Resistance Exercise: - Group verbal and visual presentation on the components of exercise prescription. Introduces F.I.T.T principle from ACSM for exercise prescriptions  Reviews F.I.T.T. principles of resistance exercise including progression. Written material given at graduation.    Education: Exercise & Equipment Safety: - Individual verbal instruction and demonstration of equipment use and safety with use of the equipment. Flowsheet Row Pulmonary Rehab from 09/29/2021 in [facility] Cardiac and Pulmonary Rehab  Date 09/29/21  Educator [facility]  Instruction Review Code 1- Verbalizes Understanding       Education: Exercise Physiology & General Exercise Guidelines: - Group verbal and written instruction with models to review the exercise physiology of the cardiovascular system and associated critical values. Provides general exercise guidelines with specific guidelines to those with heart or lung disease.    Education: Flexibility, Balance, Mind/Body Relaxation: - Group verbal and visual presentation with interactive activity on the components of exercise prescription. Introduces F.I.T.T principle from ACSM for exercise prescriptions. Reviews F.I.T.T. principles of  flexibility and balance exercise training including progression. Also discusses the mind body connection.  Reviews various relaxation techniques to help reduce and manage stress (i.e. Deep breathing, progressive muscle relaxation, and visualization). Balance handout provided to take home. Written material given at graduation. Flowsheet Row Cardiac Rehab from 04/01/2016 in [facility] Cardiac and Pulmonary Rehab  Date 02/19/16  Educator [facility]  Instruction Review Code (retired) 2- meets goals/outcomes       Activity Barriers & Risk Stratification:  Activity Barriers & Cardiac Risk Stratification - 09/29/21 1330       Activity Barriers & Cardiac Risk Stratification   Activity Barriers Shortness of Breath;Deconditioning;Muscular Weakness;Balance Concerns             6 Minute Walk:  6 Minute Walk     Row Name 09/29/21 1200         6 Minute Walk   Phase Initial     Distance 420 feet     Walk Time 2.4 minutes  stopped at 2:24 due to desaturation to 78%     # of Rest Breaks 0     MPH 1.98     METS 1.26     RPE 13     Perceived Dyspnea  3     VO2 Peak 4.41     Symptoms Yes (comment)     Comments SOB, desaturation (75% lowest)     Resting HR 66 bpm     Resting BP 126/64     Resting Oxygen Saturation  95 %     Exercise Oxygen Saturation  during 6 min walk 78 %  once seated dropped as low as 75%     Max Ex. HR 93 bpm     Max Ex. BP 152/70     2 Minute Post BP 128/64       Interval HR   1 Minute HR 85     2 Minute HR 92     3 Minute HR 93  2 Minute Post HR 69     Interval Heart Rate? Yes       Interval Oxygen   Interval Oxygen? Yes     Baseline Oxygen Saturation % 95 %     1 Minute Oxygen Saturation % 88 %     1 Minute Liters of Oxygen 0 L  Room Air     2 Minute Oxygen Saturation % 82 %     2 Minute Liters of Oxygen 0 L     3 Minute Oxygen Saturation % 78 %  at 2:24 75% once seated     3 Minute Liters of Oxygen 0 L     2 Minute Post Oxygen Saturation % 89 %     2 Minute  Post Liters of Oxygen 0 L             Oxygen Initial Assessment:  Oxygen Initial Assessment - 09/29/21 1350       Home Oxygen   Home Oxygen Device None    Sleep Oxygen Prescription None    Home Exercise Oxygen Prescription None    Home Resting Oxygen Prescription None    Compliance with Home Oxygen Use Yes      Initial 6 min Walk   Oxygen Used None      Program Oxygen Prescription   Program Oxygen Prescription Continuous;E-Tanks    Liters per minute 2    Comments order sent to MD for continous use      Intervention   Short Term Goals To learn and demonstrate proper pursed lip breathing techniques or other breathing techniques. ;To learn and understand importance of monitoring SPO2 with pulse oximeter and demonstrate accurate use of the pulse oximeter.;To learn and demonstrate proper use of respiratory medications;To learn and exhibit compliance with exercise, home and travel O2 prescription;To learn and understand importance of maintaining oxygen saturations>88%    Long  Term Goals Exhibits proper breathing techniques, such as pursed lip breathing or other method taught during program session;Demonstrates proper use of MDI's;Compliance with respiratory medication;Exhibits compliance with exercise, home  and travel O2 prescription;Verbalizes importance of monitoring SPO2 with pulse oximeter and return demonstration;Maintenance of O2 saturations>88%             Oxygen Re-Evaluation:   Oxygen Discharge (Final Oxygen Re-Evaluation):   Initial Exercise Prescription:  Initial Exercise Prescription - 09/29/21 1300       Date of Initial Exercise RX and Referring Provider   Date 09/29/21    Referring Provider Cyndie Chime MD ([place])   Primary Pulmonologist Dr. Lanney Gins     Oxygen   Oxygen Continuous    Liters 2    Maintain Oxygen Saturation 88% or higher      REL-XR   Level 1    Speed 50    Minutes 15    METs 2      Biostep-RELP   Level 2    SPM 50    Minutes  15    METs 2      Track   Laps 25    Minutes 15    METs 2.36      Prescription Details   Frequency (times per week) 3    Duration Progress to 30 minutes of continuous aerobic without signs/symptoms of physical distress      Intensity   THRR 40-80% of Max Heartrate 100-134    Ratings of Perceived Exertion 11-13    Perceived Dyspnea 0-4      Progression   Progression Continue  to progress workloads to maintain intensity without signs/symptoms of physical distress.      Resistance Training   Training Prescription Yes    Weight 3 lb    Reps 10-15             Perform Capillary Blood Glucose checks as needed.  Exercise Prescription Changes:   Exercise Prescription Changes     Row Name 09/29/21 1300             Response to Exercise   Blood Pressure (Admit) 126/64       Blood Pressure (Exercise) 152/70       Blood Pressure (Exit) 162/70  rck 142/74       Heart Rate (Admit) 66 bpm       Heart Rate (Exercise) 93 bpm       Heart Rate (Exit) 68 bpm       Oxygen Saturation (Admit) 95 %       Oxygen Saturation (Exercise) 75 %       Oxygen Saturation (Exit) 92 %       Rating of Perceived Exertion (Exercise) 13       Perceived Dyspnea (Exercise) 3       Symptoms SOB, desaturation       Comments walk test results                Exercise Comments:   Exercise Goals and Review:   Exercise Goals     Row Name 09/29/21 1348             Exercise Goals   Increase Physical Activity Yes       Intervention Provide advice, education, support and counseling about physical activity/exercise needs.;Develop an individualized exercise prescription for aerobic and resistive training based on initial evaluation findings, risk stratification, comorbidities and participant's personal goals.       Expected Outcomes Short Term: Attend rehab on a regular basis to increase amount of physical activity.;Long Term: Add in home exercise to make exercise part of routine and to  increase amount of physical activity.;Long Term: Exercising regularly at least 3-5 days a week.       Increase Strength and Stamina Yes       Intervention Provide advice, education, support and counseling about physical activity/exercise needs.;Develop an individualized exercise prescription for aerobic and resistive training based on initial evaluation findings, risk stratification, comorbidities and participant's personal goals.       Expected Outcomes Short Term: Increase workloads from initial exercise prescription for resistance, speed, and METs.;Short Term: Perform resistance training exercises routinely during rehab and add in resistance training at home;Long Term: Improve cardiorespiratory fitness, muscular endurance and strength as measured by increased METs and functional capacity (6MWT)       Able to understand and use rate of perceived exertion (RPE) scale Yes       Intervention Provide education and explanation on how to use RPE scale       Expected Outcomes Short Term: Able to use RPE daily in rehab to express subjective intensity level       Able to understand and use Dyspnea scale Yes       Intervention Provide education and explanation on how to use Dyspnea scale       Expected Outcomes Short Term: Able to use Dyspnea scale daily in rehab to express subjective sense of shortness of breath during exertion;Long Term: Able to use Dyspnea scale to guide intensity level when exercising independently  Knowledge and understanding of Target Heart Rate Range (THRR) Yes       Intervention Provide education and explanation of THRR including how the numbers were predicted and where they are located for reference       Expected Outcomes Short Term: Able to state/look up THRR;Short Term: Able to use daily as guideline for intensity in rehab;Long Term: Able to use THRR to govern intensity when exercising independently       Able to check pulse independently Yes       Intervention Provide  education and demonstration on how to check pulse in carotid and radial arteries.;Review the importance of being able to check your own pulse for safety during independent exercise       Expected Outcomes Long Term: Able to check pulse independently and accurately;Short Term: Able to explain why pulse checking is important during independent exercise       Understanding of Exercise Prescription Yes       Intervention Provide education, explanation, and written materials on patient's individual exercise prescription       Expected Outcomes Short Term: Able to explain program exercise prescription;Long Term: Able to explain home exercise prescription to exercise independently                Exercise Goals Re-Evaluation :   Discharge Exercise Prescription (Final Exercise Prescription Changes):  Exercise Prescription Changes - 09/29/21 1300       Response to Exercise   Blood Pressure (Admit) 126/64    Blood Pressure (Exercise) 152/70    Blood Pressure (Exit) 162/70   rck 142/74   Heart Rate (Admit) 66 bpm    Heart Rate (Exercise) 93 bpm    Heart Rate (Exit) 68 bpm    Oxygen Saturation (Admit) 95 %    Oxygen Saturation (Exercise) 75 %    Oxygen Saturation (Exit) 92 %    Rating of Perceived Exertion (Exercise) 13    Perceived Dyspnea (Exercise) 3    Symptoms SOB, desaturation    Comments walk test results             Nutrition:  Target Goals: Understanding of nutrition guidelines, daily intake of sodium <1534m, cholesterol <2036m calories 30% from fat and 7% or less from saturated fats, daily to have 5 or more servings of fruits and vegetables.  Education: All About Nutrition: -Group instruction provided by verbal, written material, interactive activities, discussions, models, and posters to present general guidelines for heart healthy nutrition including fat, fiber, MyPlate, the role of sodium in heart healthy nutrition, utilization of the nutrition label, and utilization of  this knowledge for meal planning. Follow up email sent as well. Written material given at graduation. [facility]  Education need identified 09/29/21       Biometrics:  Pre Biometrics - 09/29/21 1348       Pre Biometrics   Height 5' 7.6" (1.717 m)    Weight 206 lb (93.4 kg)    BMI (Calculated) 31.7    Single Leg Stand 2.8 seconds              Nutrition Therapy Plan and Nutrition Goals:  Nutrition Therapy & Goals - 09/29/21 1349       Intervention Plan   Intervention Prescribe, educate and counsel regarding individualized specific dietary modifications aiming towards targeted core components such as weight, hypertension, lipid management, diabetes, heart failure and other comorbidities.    Expected Outcomes Short Term  Goal: Understand basic principles of dietary content, such as calories, fat, sodium, cholesterol and nutrients.;Short Term Goal: A plan has been developed with personal nutrition goals set during dietitian appointment.;Long Term Goal: Adherence to prescribed nutrition plan.             Nutrition Assessments:  MEDIFICTS Score Key: ?70 Need to make dietary changes  40-70 Heart Healthy Diet ? 40 Therapeutic Level Cholesterol Diet  Flowsheet Row Pulmonary Rehab from 09/29/2021 in [facility] Cardiac and Pulmonary Rehab  Picture Your Plate Total Score on Admission 50      Picture Your Plate Scores: <09 Unhealthy dietary pattern with much room for improvement. 41-50 Dietary pattern unlikely to meet recommendations for good health and room for improvement. 51-60 More healthful dietary pattern, with some room for improvement.  >60 Healthy dietary pattern, although there may be some specific behaviors that could be improved.   Nutrition Goals Re-Evaluation:   Nutrition Goals Discharge (Final Nutrition Goals Re-Evaluation):   Psychosocial: Target Goals: Acknowledge presence or absence of  significant depression and/or stress, maximize coping skills, provide positive support system. Participant is able to verbalize types and ability to use techniques and skills needed for reducing stress and depression.   Education: Stress, Anxiety, and Depression - Group verbal and visual presentation to define topics covered.  Reviews how body is impacted by stress, anxiety, and depression.  Also discusses healthy ways to reduce stress and to treat/manage anxiety and depression.  Written material given at graduation. Flowsheet Row Cardiac Rehab from 04/01/2016 in [facility] Cardiac and Pulmonary Rehab  Date 04/01/16  Educator [facility]  Instruction Review Code (retired) 2- meets goals/outcomes       Education: Sleep Hygiene -Provides group verbal and written instruction about how sleep can affect your health.  Define sleep hygiene, discuss sleep cycles and impact of sleep habits. Review good sleep hygiene tips.    Initial Review & Psychosocial Screening:  Initial Psych Review & Screening - 09/12/21 1421       Initial Review   Current issues with Current Sleep Concerns;Current Psychotropic Meds;Current Stress Concerns    Source of Stress Concerns Chronic Illness;Unable to participate in former interests or hobbies    Comments THis illness is not me. I can not do what I used to do in the past. I am stopped now from participating in what I used to do.      Family Dynamics   Good Support System? Yes   wife, children daughter and son     Barriers   Psychosocial barriers to participate in program There are no identifiable barriers or psychosocial needs.      Screening Interventions   Interventions Encouraged to exercise    Expected Outcomes Short Term goal: Utilizing psychosocial counselor, staff and physician to assist with identification of specific Stressors or current issues interfering with healing process. Setting desired goal for each stressor or current issue identified.;Long Term Goal:  Stressors or current issues are controlled or eliminated.;Short Term goal: Identification and review with participant of any Quality of Life or Depression concerns found by scoring the questionnaire.;Long Term goal: The participant improves quality of Life and PHQ9 Scores as seen by post scores and/or verbalization of changes             Quality of Life Scores:  Scores of 19 and below usually indicate a poorer quality of life in these areas.  A difference of  2-3 points is a clinically meaningful difference.  A difference of 2-3 points in  the total score of the Quality of Life Index has been associated with significant improvement in overall quality of life, self-image, physical symptoms, and general health in studies assessing change in quality of life.  PHQ-9: Review Flowsheet       09/29/2021 03/25/2016 02/10/2016  Depression screen PHQ 2/9  Decreased Interest 0 0 1  Down, Depressed, Hopeless 1 0 0  PHQ - 2 Score 1 0 1  Altered sleeping 3 1 0  Tired, decreased energy 2 0 0  Change in appetite 1 0 1  Feeling bad or failure about yourself  1 0 1  Trouble concentrating 0 0 0  Moving slowly or fidgety/restless 0 0 0  Suicidal thoughts 0 0 0  PHQ-9 Score _0 Difficult doing work/chores Somewhat difficult Not difficult at all Not difficult at all   Interpretation of Total Score  Total Score Depression Severity:  1-4 = Minimal depression, 5-9 = Mild depression, 10-14 = Moderate depression, 15-19 = Moderately severe depression, 20-27 = Severe depression   Psychosocial Evaluation and Intervention:  Psychosocial Evaluation - 09/12/21 1428       Psychosocial Evaluation & Interventions   Interventions Encouraged to exercise with the program and follow exercise prescription    Comments Maurion has no barriers to attending the program. He lives with his wife and she is his "advocate". She is with him at all his appointments. THey have a daughter and son that are support too.  Thierno wants  to be able to be more active with less shortness of breath. He is streeesd some over the fact he is not able to participate in activities that he enjoys.  He is ready to get started with the program.    Expected Outcomes STG Waylin is able to attend all scheduled sessions, he is able to learn and use breathing techniques and mange his pulmonary meds to help decrease his symptoms. He has been in the Cardiac Rehab program in the past. He has quit tobacco, 2017, and has no desire to start using any form of tobacco.  He is ready to get started.    Continue Psychosocial Services  Follow up required by staff             Psychosocial Re-Evaluation:   Psychosocial Discharge (Final Psychosocial Re-Evaluation):   Education: Education Goals: Education classes will be provided on a weekly basis, covering required topics. Participant will state understanding/return demonstration of topics presented.  Learning Barriers/Preferences:   General Pulmonary Education Topics:  Infection Prevention: - Provides verbal and written material to individual with discussion of infection control including proper hand washing and proper equipment cleaning during exercise session. Flowsheet Row Pulmonary Rehab from 09/29/2021 in [facility] Cardiac and Pulmonary Rehab  Date 09/29/21  Educator [facility]  Instruction Review Code 1- Verbalizes Understanding       Falls Prevention: - Provides verbal and written material to individual with discussion of falls prevention and safety. Flowsheet Row Pulmonary Rehab from 09/29/2021 in [facility] Cardiac and Pulmonary Rehab  Date 09/12/21  Educator SB  Instruction Review Code 1- Verbalizes Understanding       Chronic Lung Disease Review: - Group verbal instruction with posters, models, PowerPoint presentations and videos,  to review new updates, new respiratory medications, new advancements in procedures and treatments. Providing information on websites and "800" numbers for continued  self-education. Includes information about supplement oxygen, available portable oxygen systems, continuous and intermittent flow rates, oxygen safety, concentrators, and Medicare reimbursement for oxygen. Explanation of  Pulmonary Drugs, including class, frequency, complications, importance of spacers, rinsing mouth after steroid MDI's, and proper cleaning methods for nebulizers. Review of basic lung anatomy and physiology related to function, structure, and complications of lung disease. Review of risk factors. Discussion about methods for diagnosing sleep apnea and types of masks and machines for OSA. Includes a review of the use of types of environmental controls: home humidity, furnaces, filters, dust mite/pet prevention, HEPA vacuums. Discussion about weather changes, air quality and the benefits of nasal washing. Instruction on Warning signs, infection symptoms, calling MD promptly, preventive modes, and value of vaccinations. Review of effective airway clearance, coughing and/or vibration techniques. Emphasizing that all should Create an Action Plan. Written material given at graduation. Flowsheet Row Pulmonary Rehab from 09/29/2021 in [facility] Cardiac and Pulmonary Rehab  Education need identified 09/29/21       AED/CPR: - Group verbal and written instruction with the use of models to demonstrate the basic use of the AED with the basic ABC's of resuscitation.    Anatomy and Cardiac Procedures: - Group verbal and visual presentation and models provide information about basic cardiac anatomy and function. Reviews the testing methods done to diagnose heart disease and the outcomes of the test results. Describes the treatment choices: Medical Management, Angioplasty, or Coronary Bypass Surgery for treating various heart conditions including Myocardial Infarction, Angina, Valve Disease, and Cardiac Arrhythmias.  Written material given at graduation. Flowsheet Row Cardiac Rehab from 04/01/2016 in [facility]  Cardiac and Pulmonary Rehab  Date 02/24/16  Educator TS  Instruction Review Code (retired) 2- meets goals/outcomes       Medication Safety: - Group verbal and visual instruction to review commonly prescribed medications for heart and lung disease. Reviews the medication, class of the drug, and side effects. Includes the steps to properly store meds and maintain the prescription regimen.  Written material given at graduation. Flowsheet Row Cardiac Rehab from 04/01/2016 in [facility] Cardiac and Pulmonary Rehab  Date 03/11/16  Marisue Humble 2]  Educator TS  Instruction Review Code (retired) 2- meets goals/outcomes       Other: -Provides group and verbal instruction on various topics (see comments)   Knowledge Questionnaire Score:  Knowledge Questionnaire Score - 09/29/21 1349       Knowledge Questionnaire Score   Pre Score 16/18              Core Components/Risk Factors/Patient Goals at Admission:  Personal Goals and Risk Factors at Admission - 09/29/21 1349       Core Components/Risk Factors/Patient Goals on Admission    Weight Management Yes;Weight Loss;Obesity    Intervention Weight Management: Develop a combined nutrition and exercise program designed to reach desired caloric intake, while maintaining appropriate intake of nutrient and fiber, sodium and fats, and appropriate energy expenditure required for the weight goal.;Weight Management: Provide education and appropriate resources to help participant work on and attain dietary goals.;Weight Management/Obesity: Establish reasonable short term and long term weight goals.;Obesity: Provide education and appropriate resources to help participant work on and attain dietary goals.    Admit Weight 206 lb (93.4 kg)    Goal Weight: Short Term 200 lb (90.7 kg)    Goal Weight: Long Term 195 lb (88.5 kg)    Expected Outcomes Short Term: Continue to assess and modify interventions until short term weight is achieved;Weight Loss: Understanding  of general recommendations for a balanced deficit meal plan, which promotes 1-2 lb weight loss per week and includes a negative energy balance of [phone] kcal/d;Understanding  recommendations for meals to include 15-35% energy as protein, 25-35% energy from fat, 35-60% energy from carbohydrates, less than 230m of dietary cholesterol, 20-35 gm of total fiber daily;Understanding of distribution of calorie intake throughout the day with the consumption of 4-5 meals/snacks;Long Term: Adherence to nutrition and physical activity/exercise program aimed toward attainment of established weight goal    Improve shortness of breath with ADL's Yes    Intervention Provide education, individualized exercise plan and daily activity instruction to help decrease symptoms of SOB with activities of daily living.    Expected Outcomes Short Term: Improve cardiorespiratory fitness to achieve a reduction of symptoms when performing ADLs;Long Term: Be able to perform more ADLs without symptoms or delay the onset of symptoms    Increase knowledge of respiratory medications and ability to use respiratory devices properly  Yes    Intervention Provide education and demonstration as needed of appropriate use of medications, inhalers, and oxygen therapy.    Expected Outcomes Short Term: Achieves understanding of medications use. Understands that oxygen is a medication prescribed by physician. Demonstrates appropriate use of inhaler and oxygen therapy.;Long Term: Maintain appropriate use of medications, inhalers, and oxygen therapy.    Diabetes Yes    Intervention Provide education about signs/symptoms and action to take for hypo/hyperglycemia.;Provide education about proper nutrition, including hydration, and aerobic/resistive exercise prescription along with prescribed medications to achieve blood glucose in normal ranges: Fasting glucose 65-99 mg/dL    Expected Outcomes Short Term: Participant verbalizes understanding of the  signs/symptoms and immediate care of hyper/hypoglycemia, proper foot care and importance of medication, aerobic/resistive exercise and nutrition plan for blood glucose control.;Long Term: Attainment of HbA1C < 7%.    Hypertension Yes    Intervention Provide education on lifestyle modifcations including regular physical activity/exercise, weight management, moderate sodium restriction and increased consumption of fresh fruit, vegetables, and low fat dairy, alcohol moderation, and smoking cessation.;Monitor prescription use compliance.    Expected Outcomes Short Term: Continued assessment and intervention until BP is < 140/912mHG in hypertensive participants. < 130/8068mG in hypertensive participants with diabetes, heart failure or chronic kidney disease.;Long Term: Maintenance of blood pressure at goal levels.    Lipids Yes    Intervention Provide education and support for participant on nutrition & aerobic/resistive exercise along with prescribed medications to achieve LDL <57m65mDL >40mg29m Expected Outcomes Short Term: Participant states understanding of desired cholesterol values and is compliant with medications prescribed. Participant is following exercise prescription and nutrition guidelines.;Long Term: Cholesterol controlled with medications as prescribed, with individualized exercise RX and with personalized nutrition plan. Value goals: LDL < 57mg,57m > 40 mg.             Education:Diabetes - Individual verbal and written instruction to review signs/symptoms of diabetes, desired ranges of glucose level fasting, after meals and with exercise. Acknowledge that pre and post exercise glucose checks will be done for 3 sessions at entry of program. Flowsheet Row [facility]  Date 09/29/21  Educator [facility] Review Code 1- Verbalizes Understanding       Know Your Numbers and Heart Failure: - Group verbal and visual  instruction to discuss disease risk factors for cardiac and pulmonary disease and treatment options.  Reviews associated critical values for Overweight/Obesity, Hypertension, Cholesterol, and Diabetes.  Discusses basics of heart failure: signs/symptoms and treatments.  Introduces Heart Failure Zone chart for action plan for heart failure.  Written material given at  graduation. Flowsheet Row Cardiac Rehab from 04/01/2016 in [facility] Cardiac and Pulmonary Rehab  Date 02/24/16  Educator TS  Instruction Review Code (retired) 2- meets goals/outcomes       Core Components/Risk Factors/Patient Goals Review:    Core Components/Risk Factors/Patient Goals at Discharge (Final Review):    ITP Comments:  ITP Comments     Row Name 09/12/21 1441 09/29/21 1158 10/01/21 1029       ITP Comments Virtual orientation call completed today. he has an appointment on Date: 09/29/2021  for EP eval and gym Orientation.  Documentation of diagnosis can be found in [facility] Date: 09/05/2021 . Completed 6MWT and gym orientation. Initial ITP created and sent for review to Dr. Zetta Bills, Medical Director. 30 Day review completed. Medical Director ITP review done, changes made as directed, and signed approval by Medical Director.   NEW              Comments:

## 2021-10-13 ENCOUNTER — Encounter: Payer: No Typology Code available for payment source | Admitting: *Deleted

## 2021-10-13 DIAGNOSIS — J849 Interstitial pulmonary disease, unspecified: Secondary | ICD-10-CM

## 2021-10-13 NOTE — Progress Notes (Signed)
Daily Session Note  Patient Details  Name: Brian Casey MRN: [id number] Date of Birth: 02/06/1951 Referring Provider:   Flowsheet Row Pulmonary Rehab from 09/29/2021 in [facility] Cardiac and Pulmonary Rehab  Referring Provider Cyndie Chime [place] ([place])  [facility] Pulmonologist Dr. Lanney Gins       Encounter Date: 10/13/2021  Check In:  Session Check In - 10/13/21 1104       Check-In   Supervising physician immediately available to respond to emergencies See telemetry face sheet for immediately available ER MD    Location [facility]-Cardiac & Pulmonary Rehab    Staff Present [name], MA, RCEP, CCRP, CCET;Ashara Lounsbury [place], RN Moises Blood, BS, ACSM CEP, Exercise Physiologist    Virtual Visit No    Medication changes reported     No    Fall or balance concerns reported    No    Warm-up and Cool-down Performed on first and last piece of equipment    Resistance Training Performed Yes    VAD Patient? No    PAD/SET Patient? No      Pain Assessment   Currently in Pain? No/denies                Social History   Tobacco Use  Smoking Status Former   Packs/day: 1.00   Years: 48.00   Total pack years: 48.00   Types: Cigarettes   Quit date: 11/07/2015   Years since quitting: 5.9  Smokeless Tobacco Never  Tobacco Comments   quit smoking 11/01/15    Goals Met:  Independence with exercise equipment Exercise tolerated well No report of concerns or symptoms today Strength training completed today  Goals Unmet:  Not Applicable  Comments: First full day of exercise!  Patient was oriented to gym and equipment including functions, settings, policies, and procedures.  Patient's individual exercise prescription and treatment plan were reviewed.  All starting workloads were established based on the results of the 6 minute walk test done at initial orientation visit.  The plan for exercise progression was also introduced and progression will be customized based on patient's  performance and goals.    Dr. Emily Filbert is Medical Director for [place].  Dr. Ottie Glazier is Medical Director for [facility] Pulmonary Rehabilitation.

## 2021-10-15 ENCOUNTER — Encounter: Payer: No Typology Code available for payment source | Admitting: *Deleted

## 2021-10-15 DIAGNOSIS — J849 Interstitial pulmonary disease, unspecified: Secondary | ICD-10-CM | POA: Diagnosis not present

## 2021-10-15 NOTE — Progress Notes (Signed)
Daily Session Note  Patient Details  Name: Brian Casey MRN: [id number] Date of Birth: 13-Dec-1950 Referring Provider:   Flowsheet Row Pulmonary Rehab from 09/29/2021 in [facility] Cardiac and Pulmonary Rehab  Referring Provider Cyndie Chime MD ([place])  [facility] Pulmonologist Dr. Lanney Gins       Encounter Date: 10/15/2021  Check In:  Session Check In - 10/15/21 1121       Check-In   Supervising physician immediately available to respond to emergencies See telemetry face sheet for immediately available ER MD    Location [facility]-Cardiac & Pulmonary Rehab    Staff Present [name], MA, RCEP, CCRP, CCET;Donne Baley [place], RN Moises Blood, BS, ACSM CEP, Exercise Physiologist    Virtual Visit No    Medication changes reported     No    Fall or balance concerns reported    No    Warm-up and Cool-down Performed on first and last piece of equipment    Resistance Training Performed Yes    VAD Patient? No    PAD/SET Patient? No      Pain Assessment   Currently in Pain? No/denies                Social History   Tobacco Use  Smoking Status Former   Packs/day: 1.00   Years: 48.00   Total pack years: 48.00   Types: Cigarettes   Quit date: 11/07/2015   Years since quitting: 5.9  Smokeless Tobacco Never  Tobacco Comments   quit smoking 11/01/15    Goals Met:  Independence with exercise equipment Exercise tolerated well No report of concerns or symptoms today Strength training completed today  Goals Unmet:  Not Applicable  Comments: Pt able to follow exercise prescription today without complaint.  Will continue to monitor for progression.    Dr. Emily Filbert is Medical Director for [place].  Dr. Ottie Glazier is Medical Director for [facility] Pulmonary Rehabilitation.

## 2021-10-17 ENCOUNTER — Encounter: Payer: No Typology Code available for payment source | Admitting: *Deleted

## 2021-10-17 DIAGNOSIS — J849 Interstitial pulmonary disease, unspecified: Secondary | ICD-10-CM

## 2021-10-17 NOTE — Progress Notes (Signed)
Daily Session Note  Patient Details  Name: Brian Casey MRN: [id number] Date of Birth: 01/10/1951 Referring Provider:   Flowsheet Row Pulmonary Rehab from 09/29/2021 in [facility] Cardiac and Pulmonary Rehab  Referring Provider Cyndie Chime MD ([place])  [facility] Pulmonologist Dr. Lanney Gins       Encounter Date: 10/17/2021  Check In:  Session Check In - 10/17/21 1114       Check-In   Supervising physician immediately available to respond to emergencies See telemetry face sheet for immediately available ER MD    Location [facility]-Cardiac & Pulmonary Rehab    Staff Present [name], MA, RCEP, CCRP, CCET;Brylyn Novakovich Sherryll Burger, RN BSN;Laureen Owens Shark, BS, RRT, CPFT    Virtual Visit No    Medication changes reported     No    Fall or balance concerns reported    No    Warm-up and Cool-down Performed on first and last piece of equipment    Resistance Training Performed Yes    VAD Patient? No    PAD/SET Patient? No      Pain Assessment   Currently in Pain? No/denies                Social History   Tobacco Use  Smoking Status Former   Packs/day: 1.00   Years: 48.00   Total pack years: 48.00   Types: Cigarettes   Quit date: 11/07/2015   Years since quitting: 5.9  Smokeless Tobacco Never  Tobacco Comments   quit smoking 11/01/15    Goals Met:  Independence with exercise equipment Exercise tolerated well No report of concerns or symptoms today Strength training completed today  Goals Unmet:  Not Applicable  Comments: Pt able to follow exercise prescription today without complaint.  Will continue to monitor for progression.    Dr. Emily Filbert is Medical Director for [place].  Dr. Ottie Glazier is Medical Director for [facility] Pulmonary Rehabilitation.

## 2021-10-20 ENCOUNTER — Encounter: Payer: No Typology Code available for payment source | Admitting: *Deleted

## 2021-10-20 DIAGNOSIS — J849 Interstitial pulmonary disease, unspecified: Secondary | ICD-10-CM

## 2021-10-20 NOTE — Progress Notes (Signed)
Daily Session Note  Patient Details  Name: Brian Casey MRN: [id number] Date of Birth: 1950/07/02 Referring Provider:   April Manson Pulmonary Rehab from 09/29/2021 in [facility] Cardiac and Pulmonary Rehab  Referring Provider Cyndie Chime MD ([place])  [facility] Pulmonologist Dr. Lanney Gins       Encounter Date: 10/20/2021  Check In:  Session Check In - 10/20/21 1147       Check-In   Supervising physician immediately available to respond to emergencies See telemetry face sheet for immediately available ER MD    Location [facility]-Cardiac & Pulmonary Rehab    Staff Present Heath Lark, RN, BSN, Laveda Norman, BS, ACSM CEP, Exercise Physiologist;Meredith Sherryll Burger, RN BSN    Virtual Visit No    Medication changes reported     No    Fall or balance concerns reported    No    Warm-up and Cool-down Performed on first and last piece of equipment    Resistance Training Performed Yes    VAD Patient? No    PAD/SET Patient? No      Pain Assessment   Currently in Pain? No/denies                Social History   Tobacco Use  Smoking Status Former   Packs/day: 1.00   Years: 48.00   Total pack years: 48.00   Types: Cigarettes   Quit date: 11/07/2015   Years since quitting: 5.9  Smokeless Tobacco Never  Tobacco Comments   quit smoking 11/01/15    Goals Met:  Proper associated with RPD/PD & O2 Sat Independence with exercise equipment Exercise tolerated well No report of concerns or symptoms today  Goals Unmet:  Not Applicable  Comments: Pt able to follow exercise prescription today without complaint.  Will continue to monitor for progression.    Dr. Emily Filbert is Medical Director for [place].  Dr. Ottie Glazier is Medical Director for [facility] Pulmonary Rehabilitation.

## 2021-10-22 ENCOUNTER — Telehealth: Payer: Self-pay | Admitting: Cardiovascular Disease

## 2021-10-22 ENCOUNTER — Ambulatory Visit: Payer: Non-veteran care | Admitting: Cardiovascular Disease

## 2021-10-22 NOTE — Telephone Encounter (Signed)
Patient would like a call when the consult approval from the [place] is received.

## 2021-10-23 NOTE — Telephone Encounter (Signed)
Faxed referral auth to christine jones in billing to review. Will fu when entered with patient .

## 2021-10-24 ENCOUNTER — Encounter: Payer: No Typology Code available for payment source | Attending: Pulmonary Disease | Admitting: *Deleted

## 2021-10-24 DIAGNOSIS — J849 Interstitial pulmonary disease, unspecified: Secondary | ICD-10-CM | POA: Diagnosis present

## 2021-10-24 NOTE — Progress Notes (Signed)
Daily Session Note  Patient Details  Name: DEAVON PODGORSKI MRN: [id number] Date of Birth: Jul 28, 1950 Referring Provider:   April Manson Pulmonary Rehab from 09/29/2021 in [facility] Cardiac and Pulmonary Rehab  Referring Provider Cyndie Chime MD ([place])  [facility] Pulmonologist Dr. Lanney Gins       Encounter Date: 10/24/2021  Check In:  Session Check In - 10/24/21 1112       Check-In   Supervising physician immediately available to respond to emergencies See telemetry face sheet for immediately available ER MD    Location [facility]-Cardiac & Pulmonary Rehab    Staff Present Heath Lark, RN, BSN, CCRP;Kristen Coble, RN,BC,MSN;Caspar Favila Sherryll Burger, RN BSN    Virtual Visit No    Medication changes reported     No    Fall or balance concerns reported    No    Warm-up and Cool-down Performed on first and last piece of equipment    Resistance Training Performed Yes    VAD Patient? No    PAD/SET Patient? No      Pain Assessment   Currently in Pain? No/denies                Social History   Tobacco Use  Smoking Status Former   Packs/day: 1.00   Years: 48.00   Total pack years: 48.00   Types: Cigarettes   Quit date: 11/07/2015   Years since quitting: 5.9  Smokeless Tobacco Never  Tobacco Comments   quit smoking 11/01/15    Goals Met:  Independence with exercise equipment Exercise tolerated well No report of concerns or symptoms today Strength training completed today  Goals Unmet:  Not Applicable  Comments: Pt able to follow exercise prescription today without complaint.  Will continue to monitor for progression.    Dr. Emily Filbert is Medical Director for [place].  Dr. Ottie Glazier is Medical Director for [facility] Pulmonary Rehabilitation.

## 2021-10-27 ENCOUNTER — Encounter: Payer: No Typology Code available for payment source | Admitting: *Deleted

## 2021-10-27 ENCOUNTER — Telehealth: Payer: Self-pay | Admitting: Cardiovascular Disease

## 2021-10-27 DIAGNOSIS — J849 Interstitial pulmonary disease, unspecified: Secondary | ICD-10-CM

## 2021-10-27 NOTE — Progress Notes (Signed)
Daily Session Note  Patient Details  Name: Brian Casey MRN: [id number] Date of Birth: 03-11-1951 Referring Provider:   April Manson Pulmonary Rehab from 09/29/2021 in [facility] Cardiac and Pulmonary Rehab  Referring Provider Cyndie Chime MD ([place])  [facility] Pulmonologist Dr. Lanney Gins       Encounter Date: 10/27/2021  Check In:  Session Check In - 10/27/21 1128       Check-In   Supervising physician immediately available to respond to emergencies See telemetry face sheet for immediately available ER MD    Location [facility]-Cardiac & Pulmonary Rehab    Staff Present Heath Lark, RN, BSN, Jacklynn Bue, MS, ASCM CEP, Exercise Physiologist;Kelly Amedeo Plenty, BS, ACSM CEP, Exercise Physiologist    Virtual Visit No    Medication changes reported     No    Fall or balance concerns reported    No    Warm-up and Cool-down Performed on first and last piece of equipment    Resistance Training Performed Yes    VAD Patient? No    PAD/SET Patient? No      Pain Assessment   Currently in Pain? No/denies                Social History   Tobacco Use  Smoking Status Former   Packs/day: 1.00   Years: 48.00   Total pack years: 48.00   Types: Cigarettes   Quit date: 11/07/2015   Years since quitting: 5.9  Smokeless Tobacco Never  Tobacco Comments   quit smoking 11/01/15    Goals Met:  Independence with exercise equipment Exercise tolerated well No report of concerns or symptoms today  Goals Unmet:  Not Applicable  Comments: Pt able to follow exercise prescription today without complaint.  Will continue to monitor for progression.    Dr. Emily Filbert is Medical Director for [place].  Dr. Ottie Glazier is Medical Director for [facility] Pulmonary Rehabilitation.

## 2021-10-27 NOTE — Telephone Encounter (Signed)
Status request from christine jones pending

## 2021-10-27 NOTE — Telephone Encounter (Signed)
Pt spouse calling needing to speak to someone at the front desk. She states they spoke to someone at the front desk before and they know what the situation is with his [employer] insurance. Please advise.

## 2021-10-27 NOTE — Telephone Encounter (Signed)
[facility] Rockey Situ visit .    Faxed to billing to review on 8/2. Scanned to docs in chart.

## 2021-10-27 NOTE — Telephone Encounter (Signed)
Per Meriam Sprague [facility]), she is aware of this situation and will follow up with the patient. No need to route the call per [facility].

## 2021-10-29 ENCOUNTER — Encounter: Payer: Self-pay | Admitting: *Deleted

## 2021-10-29 ENCOUNTER — Encounter: Payer: No Typology Code available for payment source | Admitting: *Deleted

## 2021-10-29 DIAGNOSIS — J849 Interstitial pulmonary disease, unspecified: Secondary | ICD-10-CM

## 2021-10-29 NOTE — Progress Notes (Signed)
Daily Session Note  Patient Details  Name: Brian Casey MRN: [id number] Date of Birth: [birth year] Referring Provider:   Flowsheet Row Pulmonary Rehab from 09/29/2021 in [facility]  Referring Provider Cyndie Chime MD ([place])  [facility] Pulmonologist Dr. Lanney Gins       Encounter Date: 10/29/2021  Check In:  Session Check In - 10/29/21 1612       Check-In   Supervising physician immediately available to respond to emergencies See telemetry face sheet for immediately available ER MD    Location [facility]-Cardiac & Pulmonary Rehab    Staff Present Nyoka Cowden, RN, BSN, Fenton Foy, BS, Exercise Physiologist;Susanne Bice, RN, BSN, CCRP;Melissa Sunsites, RDN, LDN;Jessica Vowinckel, MA, RCEP, CCRP, [place], MS, ASCM CEP, Exercise Physiologist    Virtual Visit No    Medication changes reported     No    Fall or balance concerns reported    No    Tobacco Cessation No Change    Warm-up and Cool-down Performed on first and last piece of equipment    Resistance Training Performed Yes    VAD Patient? No    PAD/SET Patient? No      Pain Assessment   Currently in Pain? No/denies                Social History   Tobacco Use  Smoking Status Former   Packs/day: 1.00   Years: 48.00   Total pack years: 48.00   Types: Cigarettes   Quit date: 11/07/2015   Years since quitting: 5.9  Smokeless Tobacco Never  Tobacco Comments   quit smoking 11/01/15    Goals Met:  Independence with exercise equipment Exercise tolerated well No report of concerns or symptoms today  Goals Unmet: N/A   Comments: Pt able to follow exercise prescription today without complaint.  Will continue to monitor for progression.    Dr. Emily Filbert is Medical Director for [facility].  Dr. Ottie Glazier is Medical Director for [facility] Pulmonary Rehabilitation.

## 2021-10-29 NOTE — Progress Notes (Signed)
Pulmonary Individual Treatment Plan  Patient Details  Name: Brian Casey MRN: [id number] Date of Birth: [birth year] Referring Provider:   April Manson Pulmonary Rehab from 09/29/2021 in [facility] Cardiac and Pulmonary Rehab  Referring Provider Cyndie Chime MD ([place])  [facility] Pulmonologist Dr. Lanney Gins       Initial Encounter Date:  Flowsheet Row Pulmonary Rehab from 09/29/2021 in [facility] Cardiac and Pulmonary Rehab  Date 09/29/21       Visit Diagnosis: ILD (interstitial lung disease) ([place])  Patient's Home Medications on Admission:  Current Outpatient Medications:    albuterol (VENTOLIN HFA) 108 (90 Base) MCG/ACT inhaler, SMARTSIG:2 inhalation Via Inhaler Every 4 Hours PRN, Disp: , Rfl:    ALPRAZolam (XANAX) 0.25 MG tablet, Take by mouth., Disp: , Rfl:    atorvastatin (LIPITOR) 40 MG tablet, Take 40 mg by mouth daily., Disp: , Rfl:    azaTHIOprine (IMURAN) 50 MG tablet, Take 50 mg by mouth daily., Disp: , Rfl:    budesonide (PULMICORT) 0.25 MG/2ML nebulizer solution, INHALE CONTENTS OF ONE VIAL/NEBULE VIA NEBULIZER TWO TIMES A DAY, Disp: , Rfl:    carvedilol (COREG) 6.25 MG tablet, Take 1 tablet (6.25 mg total) by mouth 2 (two) times daily., Disp: 60 tablet, Rfl: 2   chlorpheniramine-HYDROcodone (TUSSIONEX) 10-8 MG/5ML SUER, Take 5 mLs by mouth every 12 (twelve) hours., Disp: 115 mL, Rfl: 0   furosemide (LASIX) 20 MG tablet, Take 20 mg by mouth daily., Disp: , Rfl:    ipratropium-albuterol (DUONEB) 0.5-2.5 (3) MG/3ML SOLN, Take by nebulization., Disp: , Rfl:    levothyroxine (SYNTHROID, LEVOTHROID) 100 MCG tablet, Take 1 tablet (100 mcg total) by mouth daily before breakfast. OFFICE VISIT WITH  LABS REQUIRED FOR ADDITIONAL REFILLS (Patient taking differently: Take 112 mcg by mouth daily before breakfast. OFFICE VISIT WITH  LABS REQUIRED FOR ADDITIONAL REFILLS), Disp: 30 tablet, Rfl: 0   lisinopril (PRINIVIL,ZESTRIL) 10 MG tablet, Take 1 tablet (10 mg total) by mouth daily. (Patient not  taking: Reported on 09/12/2021), Disp: 30 tablet, Rfl: 2   omeprazole (PRILOSEC) 20 MG capsule, Take 1 capsule (20 mg total) by mouth daily. OFFICE VISIT REQUIRED FOR ADDITIONAL REFILLS, Disp: 30 capsule, Rfl: 0   predniSONE (DELTASONE) 10 MG tablet, Take 10 mg by mouth daily., Disp: , Rfl:    tadalafil (CIALIS) 5 MG tablet, Take 1 tablet by mouth as needed., Disp: , Rfl:   Past Medical History: Past Medical History:  Diagnosis Date   Acute renal failure ([place]) 11/06/2015   [name] 11/06/2015   Adenomatous colon polyp    Anxiety    Arthritis    "lower spine; fingers" (11/07/2015)   Depression    Diverticulosis    GERD (gastroesophageal reflux disease)    High cholesterol    History of bronchitis    Hypertension 10/17/2013   Hyperthyroidism    "had it radiated in his '16s"   Hypothyroidism    New onset atrial fibrillation ([place]) 11/06/2015   [name] 11/07/2015   NSTEMI (non-ST elevated myocardial infarction) ([place]) 11/07/2015   Thyroid disease    Type II diabetes mellitus ([place])    type II   Wears glasses     Tobacco Use: Social History   Tobacco Use  Smoking Status Former   Packs/day: 1.00   Years: 48.00   Total pack years: 48.00   Types: Cigarettes   Quit date: 11/07/2015   Years since quitting: 5.9  Smokeless Tobacco Never  Tobacco Comments   quit smoking 11/01/15    Labs: Review Flowsheet  More data exists      Latest Ref Rng & Units 11/09/2015 11/13/2015 11/14/2015 11/15/2015 01/15/2016  Labs for ITP Cardiac and Pulmonary Rehab  Trlycerides <150 mg/dL 437  - - - -  Hemoglobin A1c 4.8 - 5.6 % - 7.1  - - 5.5   PH, Arterial 7.350 - 7.450 - 7.482  7.361  7.308  7.366  7.421  7.352  7.361  -  PCO2 arterial 35.0 - 45.0 mmHg - 32.2  40.3  52.3  38.2  39.5  38.4  39.3  -  Bicarbonate 20.0 - 24.0 mEq/L - 23.8  23.0  26.3  21.9  25.7  21.4  22.5  -  TCO2 0 - 100 mmol/L - 24._0 -  Acid-base deficit 0.0 - 2.0 mmol/L -  - 2.0  3.0  4.0  3.0  -  O2 Saturation % - 97.1  97.0  100.0  99.0  100.0  61.7  95.0  98.0  -     Pulmonary Assessment Scores:  Pulmonary Assessment Scores     Row Name 09/29/21 1351         ADL UCSD   ADL Phase Entry     SOB Score total 59     Rest 1     Walk 3     Stairs 5     Bath 2     Dress 2     Shop 2       CAT Score   CAT Score 23       mMRC Score   mMRC Score 2              UCSD: Self-administered rating of dyspnea associated with activities of daily living (ADLs) 6-point scale (0 = "not at all" to 5 = "maximal or unable to do because of breathlessness")  Scoring Scores range from 0 to 120.  Minimally important difference is 5 units  CAT: CAT can identify the health impairment of COPD patients and is better correlated with disease progression.  CAT has a scoring range of zero to 40. The CAT score is classified into four groups of low (less than 10), medium (10 - 20), high (21-30) and very high (31-40) based on the impact level of disease on health status. A CAT score over 10 suggests significant symptoms.  A worsening CAT score could be explained by an exacerbation, poor medication adherence, poor inhaler technique, or progression of COPD or comorbid conditions.  CAT MCID is 2 points  mMRC: mMRC (Modified Medical Research Council) Dyspnea Scale is used to assess the degree of baseline functional disability in patients of respiratory disease due to dyspnea. No minimal important difference is established. A decrease in score of 1 point or greater is considered a positive change.   Pulmonary Function Assessment:  Pulmonary Function Assessment - 09/29/21 1351       Breath   Shortness of Breath Yes;Fear of Shortness of Breath;Limiting activity             Exercise Target Goals: Exercise Program Goal: Individual exercise prescription set using results from initial 6 min walk test and THRR while considering  patient's activity barriers and safety.    Exercise Prescription Goal: Initial exercise prescription builds to 30-45 minutes a day of aerobic activity, 2-3 days per week.  Home exercise guidelines will be given to patient during program as part of exercise prescription that the participant will acknowledge.  Education: Aerobic Exercise: - Group verbal and visual presentation on the components of exercise prescription. Introduces F.I.T.T principle from ACSM for exercise prescriptions.  Reviews F.I.T.T. principles of aerobic exercise including progression. Written material given at graduation. Flowsheet Row Cardiac Rehab from 04/01/2016 in [facility] Cardiac and Pulmonary Rehab  Date 02/17/16  Educator [facility]  Instruction Review Code (retired) 2- meets goals/outcomes       Education: Resistance Exercise: - Group verbal and visual presentation on the components of exercise prescription. Introduces F.I.T.T principle from ACSM for exercise prescriptions  Reviews F.I.T.T. principles of resistance exercise including progression. Written material given at graduation.    Education: Exercise & Equipment Safety: - Individual verbal instruction and demonstration of equipment use and safety with use of the equipment. Flowsheet Row Pulmonary Rehab from 10/15/2021 in [facility] Cardiac and Pulmonary Rehab  Date 09/29/21  Educator [facility]  Instruction Review Code 1- Verbalizes Understanding       Education: Exercise Physiology & General Exercise Guidelines: - Group verbal and written instruction with models to review the exercise physiology of the cardiovascular system and associated critical values. Provides general exercise guidelines with specific guidelines to those with heart or lung disease.    Education: Flexibility, Balance, Mind/Body Relaxation: - Group verbal and visual presentation with interactive activity on the components of exercise prescription. Introduces F.I.T.T principle from ACSM for exercise prescriptions. Reviews F.I.T.T. principles of  flexibility and balance exercise training including progression. Also discusses the mind body connection.  Reviews various relaxation techniques to help reduce and manage stress (i.e. Deep breathing, progressive muscle relaxation, and visualization). Balance handout provided to take home. Written material given at graduation. Flowsheet Row Cardiac Rehab from 04/01/2016 in [facility] Cardiac and Pulmonary Rehab  Date 02/19/16  Educator [facility]  Instruction Review Code (retired) 2- meets goals/outcomes       Activity Barriers & Risk Stratification:  Activity Barriers & Cardiac Risk Stratification - 09/29/21 1330       Activity Barriers & Cardiac Risk Stratification   Activity Barriers Shortness of Breath;Deconditioning;Muscular Weakness;Balance Concerns             6 Minute Walk:  6 Minute Walk     Row Name 09/29/21 1200         6 Minute Walk   Phase Initial     Distance 420 feet     Walk Time 2.4 minutes  stopped at 2:24 due to desaturation to 78%     # of Rest Breaks 0     MPH 1.98     METS 1.26     RPE 13     Perceived Dyspnea  3     VO2 Peak 4.41     Symptoms Yes (comment)     Comments SOB, desaturation (75% lowest)     Resting HR 66 bpm     Resting BP 126/64     Resting Oxygen Saturation  95 %     Exercise Oxygen Saturation  during 6 min walk 78 %  once seated dropped as low as 75%     Max Ex. HR 93 bpm     Max Ex. BP 152/70     2 Minute Post BP 128/64       Interval HR   1 Minute HR 85     2 Minute HR 92     3 Minute HR 93  2 Minute Post HR 69     Interval Heart Rate? Yes       Interval Oxygen   Interval Oxygen? Yes     Baseline Oxygen Saturation % 95 %     1 Minute Oxygen Saturation % 88 %     1 Minute Liters of Oxygen 0 L  Room Air     2 Minute Oxygen Saturation % 82 %     2 Minute Liters of Oxygen 0 L     3 Minute Oxygen Saturation % 78 %  at 2:24 75% once seated     3 Minute Liters of Oxygen 0 L     2 Minute Post Oxygen Saturation % 89 %     2 Minute  Post Liters of Oxygen 0 L             Oxygen Initial Assessment:  Oxygen Initial Assessment - 09/29/21 1350       Home Oxygen   Home Oxygen Device None    Sleep Oxygen Prescription None    Home Exercise Oxygen Prescription None    Home Resting Oxygen Prescription None    Compliance with Home Oxygen Use Yes      Initial 6 min Walk   Oxygen Used None      Program Oxygen Prescription   Program Oxygen Prescription Continuous;E-Tanks    Liters per minute 2    Comments order sent to MD for continous use      Intervention   Short Term Goals To learn and demonstrate proper pursed lip breathing techniques or other breathing techniques. ;To learn and understand importance of monitoring SPO2 with pulse oximeter and demonstrate accurate use of the pulse oximeter.;To learn and demonstrate proper use of respiratory medications;To learn and exhibit compliance with exercise, home and travel O2 prescription;To learn and understand importance of maintaining oxygen saturations>88%    Long  Term Goals Exhibits proper breathing techniques, such as pursed lip breathing or other method taught during program session;Demonstrates proper use of MDI's;Compliance with respiratory medication;Exhibits compliance with exercise, home  and travel O2 prescription;Verbalizes importance of monitoring SPO2 with pulse oximeter and return demonstration;Maintenance of O2 saturations>88%             Oxygen Re-Evaluation:  Oxygen Re-Evaluation     Row Name 10/13/21 1109 10/27/21 1121           Program Oxygen Prescription   Program Oxygen Prescription -- Continuous;E-Tanks      Liters per minute -- 2      Comments -- On oxygen for exercise.        Home Oxygen   Home Oxygen Device -- None  Will ask MD at appt 8/15      Sleep Oxygen Prescription -- None      Home Exercise Oxygen Prescription -- None  Asking MD about prescription      Home Resting Oxygen Prescription -- None      Compliance with Home Oxygen  Use -- Yes        Goals/Expected Outcomes   Short Term Goals -- To learn and demonstrate proper pursed lip breathing techniques or other breathing techniques. ;To learn and understand importance of monitoring SPO2 with pulse oximeter and demonstrate accurate use of the pulse oximeter.;To learn and demonstrate proper use of respiratory medications;To learn and exhibit compliance with exercise, home and travel O2 prescription;To learn and understand importance of maintaining oxygen saturations>88%      Long  Term Goals -- Exhibits proper breathing  techniques, such as pursed lip breathing or other method taught during program session;Demonstrates proper use of MDI's;Compliance with respiratory medication;Exhibits compliance with exercise, home  and travel O2 prescription;Verbalizes importance of monitoring SPO2 with pulse oximeter and return demonstration;Maintenance of O2 saturations>88%      Comments Reviewed PLB technique with pt.  Talked about how it works and it's importance in maintaining their exercise saturations. Raiquan does not have oxygen for home yet. EP did not go over home exercise at this time due to wanting home O2 prescription first. He is compliant with it as rehab and responds to it well. He did confirm that he has a pulse ox at home but does not check his O2 while during activity. I encouraged him to start that immediately and to note and stop when he is below 88%. He has been practicing PLB since he learned it his firts day at rehab and feels it helps him tremendously.      Goals/Expected Outcomes Short: Become more profiecient at using PLB.   Long: Become independent at using PLB. Short: Start checking O2 during activity at home, stop and PLB at 88% or low/ talk to doc about home o2 prescription Long: Become efficient at PLB               Oxygen Discharge (Final Oxygen Re-Evaluation):  Oxygen Re-Evaluation - 10/27/21 1121       Program Oxygen Prescription   Program Oxygen  Prescription Continuous;E-Tanks    Liters per minute 2    Comments On oxygen for exercise.      Home Oxygen   Home Oxygen Device None   Will ask MD at appt 8/15   Sleep Oxygen Prescription None    Home Exercise Oxygen Prescription None   Asking MD about prescription   Home Resting Oxygen Prescription None    Compliance with Home Oxygen Use Yes      Goals/Expected Outcomes   Short Term Goals To learn and demonstrate proper pursed lip breathing techniques or other breathing techniques. ;To learn and understand importance of monitoring SPO2 with pulse oximeter and demonstrate accurate use of the pulse oximeter.;To learn and demonstrate proper use of respiratory medications;To learn and exhibit compliance with exercise, home and travel O2 prescription;To learn and understand importance of maintaining oxygen saturations>88%    Long  Term Goals Exhibits proper breathing techniques, such as pursed lip breathing or other method taught during program session;Demonstrates proper use of MDI's;Compliance with respiratory medication;Exhibits compliance with exercise, home  and travel O2 prescription;Verbalizes importance of monitoring SPO2 with pulse oximeter and return demonstration;Maintenance of O2 saturations>88%    Comments [place] does not have oxygen for home yet. EP did not go over home exercise at this time due to wanting home O2 prescription first. He is compliant with it as rehab and responds to it well. He did confirm that he has a pulse ox at home but does not check his O2 while during activity. I encouraged him to start that immediately and to note and stop when he is below 88%. He has been practicing PLB since he learned it his firts day at rehab and feels it helps him tremendously.    Goals/Expected Outcomes Short: Start checking O2 during activity at home, stop and PLB at 88% or low/ talk to doc about home o2 prescription Long: Become efficient at PLB             Initial Exercise  Prescription:  Initial Exercise Prescription - 09/29/21 1300  Date of Initial Exercise RX and Referring Provider   Date 09/29/21    Referring Provider Cyndie Chime MD ([place])   Primary Pulmonologist Dr. Lanney Gins     Oxygen   Oxygen Continuous    Liters 2    Maintain Oxygen Saturation 88% or higher      REL-XR   Level 1    Speed 50    Minutes 15    METs 2      Biostep-RELP   Level 2    SPM 50    Minutes 15    METs 2      Track   Laps 25    Minutes 15    METs 2.36      Prescription Details   Frequency (times per week) 3    Duration Progress to 30 minutes of continuous aerobic without signs/symptoms of physical distress      Intensity   THRR 40-80% of Max Heartrate 100-134    Ratings of Perceived Exertion 11-13    Perceived Dyspnea 0-4      Progression   Progression Continue to progress workloads to maintain intensity without signs/symptoms of physical distress.      Resistance Training   Training Prescription Yes    Weight 3 lb    Reps 10-15             Perform Capillary Blood Glucose checks as needed.  Exercise Prescription Changes:   Exercise Prescription Changes     Row Name 09/29/21 1300 10/22/21 0700           Response to Exercise   Blood Pressure (Admit) 126/64 142/80      Blood Pressure (Exercise) 152/70 160/80      Blood Pressure (Exit) 162/70  rck 142/74 130/78      Heart Rate (Admit) 66 bpm 64 bpm      Heart Rate (Exercise) 93 bpm 112 bpm      Heart Rate (Exit) 68 bpm 81 bpm      Oxygen Saturation (Admit) 95 % 97 %      Oxygen Saturation (Exercise) 75 % 88 %      Oxygen Saturation (Exit) 92 % 94 %      Rating of Perceived Exertion (Exercise) 13 15      Perceived Dyspnea (Exercise) 3 3      Symptoms SOB, desaturation SOB      Comments walk test results 1st full day of exercise      Duration -- Progress to 30 minutes of  aerobic without signs/symptoms of physical distress      Intensity -- THRR unchanged        Progression    Progression -- Continue to progress workloads to maintain intensity without signs/symptoms of physical distress.      Average METs -- 2.16        Resistance Training   Training Prescription -- Yes      Weight -- 3 lb      Reps -- 10-15        Interval Training   Interval Training -- No        Oxygen   Oxygen -- Continuous      Liters -- 4        NuStep   Level -- 1      Minutes -- 15      METs -- 2.8        Arm Ergometer   Level -- 1      Minutes --  15      METs -- 2.3        REL-XR   Level -- 1      Minutes -- 15      METs -- 2.8        Biostep-RELP   Level -- 2      Minutes -- 15      METs -- 2        Track   Laps -- 10      Minutes -- 15      METs -- 1.54        Oxygen   Maintain Oxygen Saturation -- 88% or higher               Exercise Comments:   Exercise Goals and Review:   Exercise Goals     Row Name 09/29/21 1348             Exercise Goals   Increase Physical Activity Yes       Intervention Provide advice, education, support and counseling about physical activity/exercise needs.;Develop an individualized exercise prescription for aerobic and resistive training based on initial evaluation findings, risk stratification, comorbidities and participant's personal goals.       Expected Outcomes Short Term: Attend rehab on a regular basis to increase amount of physical activity.;Long Term: Add in home exercise to make exercise part of routine and to increase amount of physical activity.;Long Term: Exercising regularly at least 3-5 days a week.       Increase Strength and Stamina Yes       Intervention Provide advice, education, support and counseling about physical activity/exercise needs.;Develop an individualized exercise prescription for aerobic and resistive training based on initial evaluation findings, risk stratification, comorbidities and participant's personal goals.       Expected Outcomes Short Term: Increase workloads from initial  exercise prescription for resistance, speed, and METs.;Short Term: Perform resistance training exercises routinely during rehab and add in resistance training at home;Long Term: Improve cardiorespiratory fitness, muscular endurance and strength as measured by increased METs and functional capacity (6MWT)       Able to understand and use rate of perceived exertion (RPE) scale Yes       Intervention Provide education and explanation on how to use RPE scale       Expected Outcomes Short Term: Able to use RPE daily in rehab to express subjective intensity level       Able to understand and use Dyspnea scale Yes       Intervention Provide education and explanation on how to use Dyspnea scale       Expected Outcomes Short Term: Able to use Dyspnea scale daily in rehab to express subjective sense of shortness of breath during exertion;Long Term: Able to use Dyspnea scale to guide intensity level when exercising independently       Knowledge and understanding of Target Heart Rate Range (THRR) Yes       Intervention Provide education and explanation of THRR including how the numbers were predicted and where they are located for reference       Expected Outcomes Short Term: Able to state/look up THRR;Short Term: Able to use daily as guideline for intensity in rehab;Long Term: Able to use THRR to govern intensity when exercising independently       Able to check pulse independently Yes       Intervention Provide education and demonstration on how to check pulse in carotid and radial arteries.;Review the importance  of being able to check your own pulse for safety during independent exercise       Expected Outcomes Long Term: Able to check pulse independently and accurately;Short Term: Able to explain why pulse checking is important during independent exercise       Understanding of Exercise Prescription Yes       Intervention Provide education, explanation, and written materials on patient's individual exercise  prescription       Expected Outcomes Short Term: Able to explain program exercise prescription;Long Term: Able to explain home exercise prescription to exercise independently                Exercise Goals Re-Evaluation :  Exercise Goals Re-Evaluation     Row Name 10/13/21 1107 10/22/21 0723 10/27/21 1117         Exercise Goal Re-Evaluation   Exercise Goals Review Increase Physical Activity;Able to understand and use rate of perceived exertion (RPE) scale;Knowledge and understanding of Target Heart Rate Range (THRR);Understanding of Exercise Prescription;Increase Strength and Stamina;Able to check pulse independently;Able to understand and use Dyspnea scale Increase Physical Activity;Increase Strength and Stamina;Understanding of Exercise Prescription Increase Physical Activity;Increase Strength and Stamina;Understanding of Exercise Prescription     Comments Reviewed RPE and dyspnea scales, THR and program prescription with pt today.  Pt voiced understanding and was given a copy of goals to take home. Shaunak is off to a good start in rehab. He had an average overall MET level of 2.16 METs. He did well with the biostep on level 2 as well. He also got 10 laps on the track, so we will tell him to push for more. We will continue to monitor his progress in the program. Jr is doing some walking at home, but nothing structured too much. He does not have an O2 oxygen prescription for home yet. He is still talking with doctor about it and has an appointment with his Pulmonologist on 8/15. EP will hold off on going over home exercise until the oxygen prescription is established. We talked about having him use his own pulse ox to check during his activity at home, stop/rest/PLB when needed and when oxygen is below 88%. He plans to keep walking at home for his home exercise plan.     Expected Outcomes Short: Use RPE daily to regulate intensity. Long: Follow program prescription in THR. Short: Continue to  push for more laps on the track. Long: Continue to increase strength and stamina. Short: EP to go over home exercise, check O2 at home diligently Long: Exercise independently at home at appropriate prescription              Discharge Exercise Prescription (Final Exercise Prescription Changes):  Exercise Prescription Changes - 10/22/21 0700       Response to Exercise   Blood Pressure (Admit) 142/80    Blood Pressure (Exercise) 160/80    Blood Pressure (Exit) 130/78    Heart Rate (Admit) 64 bpm    Heart Rate (Exercise) 112 bpm    Heart Rate (Exit) 81 bpm    Oxygen Saturation (Admit) 97 %    Oxygen Saturation (Exercise) 88 %    Oxygen Saturation (Exit) 94 %    Rating of Perceived Exertion (Exercise) 15    Perceived Dyspnea (Exercise) 3    Symptoms SOB    Comments 1st full day of exercise    Duration Progress to 30 minutes of  aerobic without signs/symptoms of physical distress    Intensity THRR unchanged  Progression   Progression Continue to progress workloads to maintain intensity without signs/symptoms of physical distress.    Average METs 2.16      Resistance Training   Training Prescription Yes    Weight 3 lb    Reps 10-15      Interval Training   Interval Training No      Oxygen   Oxygen Continuous    Liters 4      NuStep   Level 1    Minutes 15    METs 2.8      Arm Ergometer   Level 1    Minutes 15    METs 2.3      REL-XR   Level 1    Minutes 15    METs 2.8      Biostep-RELP   Level 2    Minutes 15    METs 2      Track   Laps 10    Minutes 15    METs 1.54      Oxygen   Maintain Oxygen Saturation 88% or higher             Nutrition:  Target Goals: Understanding of nutrition guidelines, daily intake of sodium <1553m, cholesterol <2052m calories 30% from fat and 7% or less from saturated fats, daily to have 5 or more servings of fruits and vegetables.  Education: All About Nutrition: -Group instruction provided by verbal,  written material, interactive activities, discussions, models, and posters to present general guidelines for heart healthy nutrition including fat, fiber, MyPlate, the role of sodium in heart healthy nutrition, utilization of the nutrition label, and utilization of this knowledge for meal planning. Follow up email sent as well. Written material given at graduation. Flow[facility]  Education need identified 09/29/21  Date 10/15/21  Educator MCClendeninInstruction Review Code 1- Verbalizes Understanding       Biometrics:  Pre Biometrics - 09/29/21 1348       Pre Biometrics   Height 5' 7.6" (1.717 m)    Weight 206 lb (93.4 kg)    BMI (Calculated) 31.7    Single Leg Stand 2.8 seconds              Nutrition Therapy Plan and Nutrition Goals:  Nutrition Therapy & Goals - 09/29/21 1349       Intervention Plan   Intervention Prescribe, educate and counsel regarding individualized specific dietary modifications aiming towards targeted core components such as weight, hypertension, lipid management, diabetes, heart failure and other comorbidities.    Expected Outcomes Short Term Goal: Understand basic principles of dietary content, such as calories, fat, sodium, cholesterol and nutrients.;Short Term Goal: A plan has been developed with personal nutrition goals set during dietitian appointment.;Long Term Goal: Adherence to prescribed nutrition plan.             Nutrition Assessments:  MEDIFICTS Score Key: ?70 Need to make dietary changes  40-70 Heart Healthy Diet ? 40 Therapeutic Level Cholesterol Diet  [facility]  Picture Your Plate Total Score on Admission 50      Picture Your Plate Scores: <4<26nhealthy dietary pattern with much room for improvement. 41-50 Dietary pattern unlikely to meet recommendations for good health and room for  improvement. 51-60 More healthful dietary pattern, with some room for improvement.  >60 Healthy dietary pattern, although there may be some specific behaviors that  could be improved.   Nutrition Goals Re-Evaluation:  Nutrition Goals Re-Evaluation     [place] Name 10/27/21 1336             Goals   Comment Harim has yet to meet with the RD.       Expected Outcome Short: Make appt with RD and attend Long: Follow RD goals long-term                Nutrition Goals Discharge (Final Nutrition Goals Re-Evaluation):  Nutrition Goals Re-Evaluation - 10/27/21 1336       Goals   Comment Mariah has yet to meet with the RD.    Expected Outcome Short: Make appt with RD and attend Long: Follow RD goals long-term             Psychosocial: Target Goals: Acknowledge presence or absence of significant depression and/or stress, maximize coping skills, provide positive support system. Participant is able to verbalize types and ability to use techniques and skills needed for reducing stress and depression.   Education: Stress, Anxiety, and Depression - Group verbal and visual presentation to define topics covered.  Reviews how body is impacted by stress, anxiety, and depression.  Also discusses healthy ways to reduce stress and to treat/manage anxiety and depression.  Written material given at graduation. Flowsheet Row Cardiac Rehab from 04/01/2016 in [facility] Card[facility] and Pulmonary Rehab  Date 04/01/16  Educator [facility]  Instruction Review Code (retired) 2- meets goals/outcomes       Education: Sleep Hygiene -Provides group verbal and written instruction about how sleep can affect your health.  Define sleep hygiene, discuss sleep cycles and impact of sleep habits. Review good sleep hygiene tips.    Initial Review & Psychosocial Screening:  Initial Psych Review & Screening - 09/12/21 1421       Initial Review   Current issues with Current Sleep Concerns;Current Psychotropic Meds;Current Stress  Concerns    Source of Stress Concerns Chronic Illness;Unable to participate in former interests or hobbies    Comments THis illness is not me. I can not do what I used to do in the past. I am stopped now from participating in what I used to do.      Family Dynamics   Good Support System? Yes   wife, children daughter and son     Barriers   Psychosocial barriers to participate in program There are no identifiable barriers or psychosocial needs.      Screening Interventions   Interventions Encouraged to exercise    Expected Outcomes Short Term goal: Utilizing psychosocial counselor, staff and physician to assist with identification of specific Stressors or current issues interfering with healing process. Setting desired goal for each stressor or current issue identified.;Long Term Goal: Stressors or current issues are controlled or eliminated.;Short Term goal: Identification and review with participant of any Quality of Life or Depression concerns found by scoring the questionnaire.;Long Term goal: The participant improves quality of Life and PHQ9 Scores as seen by post scores and/or verbalization of changes             Quality of Life Scores:  Scores of 19 and below usually indicate a poorer quality of life in these areas.  A difference of  2-3 points is a clinically meaningful difference.  A difference of 2-3 points in the total score of the Quality of Life Index has been associated with significant improvement in overall quality of life, self-image, physical symptoms, and general health in studies assessing  change in quality of life.  PHQ-9: Review Flowsheet       09/29/2021 03/25/2016 02/10/2016  Depression screen PHQ 2/9  Decreased Interest 0 0 1  Down, Depressed, Hopeless 1 0 0  PHQ - 2 Score 1 0 1  Altered sleeping 3 1 0  Tired, decreased energy 2 0 0  Change in appetite 1 0 1  Feeling bad or failure about yourself  1 0 1  Trouble concentrating 0 0 0  Moving slowly or  fidgety/restless 0 0 0  Suicidal thoughts 0 0 0  PHQ-9 Score _0 Difficult doing work/chores Somewhat difficult Not difficult at all Not difficult at all   Interpretation of Total Score  Total Score Depression Severity:  1-4 = Minimal depression, 5-9 = Mild depression, 10-14 = Moderate depression, 15-19 = Moderately severe depression, 20-27 = Severe depression   Psychosocial Evaluation and Intervention:  Psychosocial Evaluation - 09/12/21 1428       Psychosocial Evaluation & Interventions   Interventions Encouraged to exercise with the program and follow exercise prescription    Comments Kahlen has no barriers to attending the program. He lives with his wife and she is his "advocate". She is with him at all his appointments. THey have a daughter and son that are support too.  Bosco wants to be able to be more active with less shortness of breath. He is streeesd some over the fact he is not able to participate in activities that he enjoys.  He is ready to get started with the program.    Expected Outcomes STG Nickolai is able to attend all scheduled sessions, he is able to learn and use breathing techniques and mange his pulmonary meds to help decrease his symptoms. He has been in the Cardiac Rehab program in the past. He has quit tobacco, 2017, and has no desire to start using any form of tobacco.  He is ready to get started.    Continue Psychosocial Services  Follow up required by staff             Psychosocial Re-Evaluation:  Psychosocial Re-Evaluation     [place] Name 10/27/21 1123             Psychosocial Re-Evaluation   Current issues with Current Sleep Concerns       Comments Dontreal's sleep is all over the place. He sometimes gets 3 hours, sometimes 8. His doctor put him on prednisone since May- states his doctor told him he needs to take it and doesn't really have any other options for sleep alternatives. He takes Xanax before bed which he feels helps him somewhat but still  frustrated he doesn't get consistent sleep. Really encouraged him to follow up with his doctor again who he sees on the 15th to let them know how much his sleep is affected by the prednisone. His wife is good support and goes to him with his appointments. He hopes to see a better improvement with his symptoms the more he is in rehab. He is very motivated to do whatever it takes to manage his ILD.       Expected Outcomes Short: Talk to doctor about prednisone and sleep Long: Continue to maintain posititve attitude       Interventions Encouraged to attend Pulmonary Rehabilitation for the exercise       Continue Psychosocial Services  Follow up required by staff                Psychosocial  Discharge (Final Psychosocial Re-Evaluation):  Psychosocial Re-Evaluation - 10/27/21 1123       Psychosocial Re-Evaluation   Current issues with Current Sleep Concerns    Comments Cem's sleep is all over the place. He sometimes gets 3 hours, sometimes 8. His doctor put him on prednisone since May- states his doctor told him he needs to take it and doesn't really have any other options for sleep alternatives. He takes Xanax before bed which he feels helps him somewhat but still frustrated he doesn't get consistent sleep. Really encouraged him to follow up with his doctor again who he sees on the 15th to let them know how much his sleep is affected by the prednisone. His wife is good support and goes to him with his appointments. He hopes to see a better improvement with his symptoms the more he is in rehab. He is very motivated to do whatever it takes to manage his ILD.    Expected Outcomes Short: Talk to doctor about prednisone and sleep Long: Continue to maintain posititve attitude    Interventions Encouraged to attend Pulmonary Rehabilitation for the exercise    Continue Psychosocial Services  Follow up required by staff             Education: Education Goals: Education classes will be provided on a  weekly basis, covering required topics. Participant will state understanding/return demonstration of topics presented.  Learning Barriers/Preferences:   General Pulmonary Education Topics:  Infection Prevention: - Provides verbal and written material to individual with discussion of infection control including proper hand washing and proper equipment cleaning during exercise session. Flowsheet Row Pulmonary Rehab from 10/15/2021 in [facility] Cardiac and Pulmonary Rehab  Date 09/29/21  Educator [facility]  Instruction Review Code 1- Verbalizes Understanding       Falls Prevention: - Provides verbal and written material to individual with discussion of falls prevention and safety. Flowsheet Row Pulmonary Rehab from 10/15/2021 in [facility] Cardiac and Pulmonary Rehab  Date 09/12/21  Educator SB  Instruction Review Code 1- Verbalizes Understanding       Chronic Lung Disease Review: - Group verbal instruction with posters, models, PowerPoint presentations and videos,  to review new updates, new respiratory medications, new advancements in procedures and treatments. Providing information on websites and "800" numbers for continued self-education. Includes information about supplement oxygen, available portable oxygen systems, continuous and intermittent flow rates, oxygen safety, concentrators, and Medicare reimbursement for oxygen. Explanation of Pulmonary Drugs, including class, frequency, complications, importance of spacers, rinsing mouth after steroid MDI's, and proper cleaning methods for nebulizers. Review of basic lung anatomy and physiology related to function, structure, and complications of lung disease. Review of risk factors. Discussion about methods for diagnosing sleep apnea and types of masks and machines for OSA. Includes a review of the use of types of environmental controls: home humidity, furnaces, filters, dust mite/pet prevention, HEPA vacuums. Discussion about weather changes, air quality  and the benefits of nasal washing. Instruction on Warning signs, infection symptoms, calling MD promptly, preventive modes, and value of vaccinations. Review of effective airway clearance, coughing and/or vibration techniques. Emphasizing that all should Create an Action Plan. Written material given at graduation. Flowsheet Row Pulmonary Rehab from 10/15/2021 in [facility] Cardiac and Pulmonary Rehab  Education need identified 09/29/21       AED/CPR: - Group verbal and written instruction with the use of models to demonstrate the basic use of the AED with the basic ABC's of resuscitation.    Anatomy and Cardiac Procedures: -  Group verbal and visual presentation and models provide information about basic cardiac anatomy and function. Reviews the testing methods done to diagnose heart disease and the outcomes of the test results. Describes the treatment choices: Medical Management, Angioplasty, or Coronary Bypass Surgery for treating various heart conditions including Myocardial Infarction, Angina, Valve Disease, and Cardiac Arrhythmias.  Written material given at graduation. Flowsheet Row Cardiac Rehab from 04/01/2016 in [facility] Cardiac and Pulmonary Rehab  Date 02/24/16  Educator TS  Instruction Review Code (retired) 2- meets goals/outcomes       Medication Safety: - Group verbal and visual instruction to review commonly prescribed medications for heart and lung disease. Reviews the medication, class of the drug, and side effects. Includes the steps to properly store meds and maintain the prescription regimen.  Written material given at graduation. Flowsheet Row Cardiac Rehab from 04/01/2016 in [facility] Cardiac and Pulmonary Rehab  Date 03/11/16  Marisue Humble 2]  Educator TS  Instruction Review Code (retired) 2- meets goals/outcomes       Other: -Provides group and verbal instruction on various topics (see comments)   Knowledge Questionnaire Score:  Knowledge Questionnaire Score - 09/29/21 1349        Knowledge Questionnaire Score   Pre Score 16/18              Core Components/Risk Factors/Patient Goals at Admission:  Personal Goals and Risk Factors at Admission - 09/29/21 1349       Core Components/Risk Factors/Patient Goals on Admission    Weight Management Yes;Weight Loss;Obesity    Intervention Weight Management: Develop a combined nutrition and exercise program designed to reach desired caloric intake, while maintaining appropriate intake of nutrient and fiber, sodium and fats, and appropriate energy expenditure required for the weight goal.;Weight Management: Provide education and appropriate resources to help participant work on and attain dietary goals.;Weight Management/Obesity: Establish reasonable short term and long term weight goals.;Obesity: Provide education and appropriate resources to help participant work on and attain dietary goals.    Admit Weight 206 lb (93.4 kg)    Goal Weight: Short Term 200 lb (90.7 kg)    Goal Weight: Long Term 195 lb (88.5 kg)    Expected Outcomes Short Term: Continue to assess and modify interventions until short term weight is achieved;Weight Loss: Understanding of general recommendations for a balanced deficit meal plan, which promotes 1-2 lb weight loss per week and includes a negative energy balance of [phone] kcal/d;Understanding recommendations for meals to include 15-35% energy as protein, 25-35% energy from fat, 35-60% energy from carbohydrates, less than 243m of dietary cholesterol, 20-35 gm of total fiber daily;Understanding of distribution of calorie intake throughout the day with the consumption of 4-5 meals/snacks;Long Term: Adherence to nutrition and physical activity/exercise program aimed toward attainment of established weight goal    Improve shortness of breath with ADL's Yes    Intervention Provide education, individualized exercise plan and daily activity instruction to help decrease symptoms of SOB with activities of daily  living.    Expected Outcomes Short Term: Improve cardiorespiratory fitness to achieve a reduction of symptoms when performing ADLs;Long Term: Be able to perform more ADLs without symptoms or delay the onset of symptoms    Increase knowledge of respiratory medications and ability to use respiratory devices properly  Yes    Intervention Provide education and demonstration as needed of appropriate use of medications, inhalers, and oxygen therapy.    Expected Outcomes Short Term: Achieves understanding of medications use. Understands that oxygen is a medication prescribed  by physician. Demonstrates appropriate use of inhaler and oxygen therapy.;Long Term: Maintain appropriate use of medications, inhalers, and oxygen therapy.    Diabetes Yes    Intervention Provide education about signs/symptoms and action to take for hypo/hyperglycemia.;Provide education about proper nutrition, including hydration, and aerobic/resistive exercise prescription along with prescribed medications to achieve blood glucose in normal ranges: Fasting glucose 65-99 mg/dL    Expected Outcomes Short Term: Participant verbalizes understanding of the signs/symptoms and immediate care of hyper/hypoglycemia, proper foot care and importance of medication, aerobic/resistive exercise and nutrition plan for blood glucose control.;Long Term: Attainment of HbA1C < 7%.    Hypertension Yes    Intervention Provide education on lifestyle modifcations including regular physical activity/exercise, weight management, moderate sodium restriction and increased consumption of fresh fruit, vegetables, and low fat dairy, alcohol moderation, and smoking cessation.;Monitor prescription use compliance.    Expected Outcomes Short Term: Continued assessment and intervention until BP is < 140/21m HG in hypertensive participants. < 130/868mHG in hypertensive participants with diabetes, heart failure or chronic kidney disease.;Long Term: Maintenance of blood  pressure at goal levels.    Lipids Yes    Intervention Provide education and support for participant on nutrition & aerobic/resistive exercise along with prescribed medications to achieve LDL <7068mHDL >76m57m  Expected Outcomes Short Term: Participant states understanding of desired cholesterol values and is compliant with medications prescribed. Participant is following exercise prescription and nutrition guidelines.;Long Term: Cholesterol controlled with medications as prescribed, with individualized exercise RX and with personalized nutrition plan. Value goals: LDL < 70mg32mL > 40 mg.             Education:Diabetes - Individual verbal and written instruction to review signs/symptoms of diabetes, desired ranges of glucose level fasting, after meals and with exercise. Acknowledge that pre and post exercise glucose checks will be done for 3 sessions at entry of program. Flowsheet Row [facility]  Date 09/29/21  Educator [facility] Review Code 1- Verbalizes Understanding       Know Your Numbers and Heart Failure: - Group verbal and visual instruction to discuss disease risk factors for cardiac and pulmonary disease and treatment options.  Reviews associated critical values for Overweight/Obesity, Hypertension, Cholesterol, and Diabetes.  Discusses basics of heart failure: signs/symptoms and treatments.  Introduces Heart Failure Zone chart for action plan for heart failure.  Written material given at graduation. Flowsheet [facility]  Date 02/24/16  Educator TS  Instruction Review Code (retired) 2- meets goals/outcomes       Core Components/Risk Factors/Patient Goals Review:   Goals and Risk Factor Review     Row Name 10/27/21 1129             Core Components/Risk Factors/Patient Goals Review   Personal Goals Review Diabetes;Improve shortness of breath with  ADL's;Weight Management/Obesity;Hypertension       Review LarryTjhecking his blood sugar, its ranging around 120 or less in the morning. His BP at home ranges 130 [id number]./22tolic. His BP is running higher than normal because he has been on prednisone for a while. Because of that, his weight has increased to 204 lb. He used to be in the 190s. He is going to check with his doctor to see if there is something else he can take. He checks weight  about every other day. He does  not feel so much difference in SOB  since rehab but only started a little while ago. He is still getting used to his oxygen during exercise.       Expected Outcomes Short: Check with doctor on prednisone Long: Continue to manage lifestyel risk factors                Core Components/Risk Factors/Patient Goals at Discharge (Final Review):   Goals and Risk Factor Review - 10/27/21 1129       Core Components/Risk Factors/Patient Goals Review   Personal Goals Review Diabetes;Improve shortness of breath with ADL's;Weight Management/Obesity;Hypertension    Review Chantz is checking his blood sugar, its ranging around 120 or less in the morning. His BP at home ranges 184 QTTCNGFR./43 diastolic. His BP is running higher than normal because he has been on prednisone for a while. Because of that, his weight has increased to 204 lb. He used to be in the 190s. He is going to check with his doctor to see if there is something else he can take. He checks weight  about every other day. He does  not feel so much difference in SOB since rehab but only started a little while ago. He is still getting used to his oxygen during exercise.    Expected Outcomes Short: Check with doctor on prednisone Long: Continue to manage lifestyel risk factors             ITP Comments:  ITP Comments     Row Name 09/12/21 1441 09/29/21 1158 10/01/21 1029 10/13/21 1106 10/28/21 1552   ITP Comments Virtual orientation call completed today. he has an  appointment on Date: 09/29/2021  for EP eval and gym Orientation.  Documentation of diagnosis can be found in [facility] Date: 09/05/2021 . Completed 6MWT and gym orientation. Initial ITP created and sent for review to Dr. Zetta Bills, Medical Director. 30 Day review completed. Medical Director ITP review done, changes made as directed, and signed approval by Medical Director.   NEW First full day of exercise!  Patient was oriented to gym and equipment including functions, settings, policies, and procedures.  Patient's individual exercise prescription and treatment plan were reviewed.  All starting workloads were established based on the results of the 6 minute walk test done at initial orientation visit.  The plan for exercise progression was also introduced and progression will be customized based on patient's performance and goals. Sent note to Dr. Chase Caller regarding patient to get home oxygen therapy. Patient currently has rehab oxygen prescription, but still waiting for him to get some for home. Encouraged him to check O2 saturations at home with pulse ox. Patient to see Dr. Chase Caller on 8/15 but hope to hear back on prescription ASAP.    [place] Name 10/29/21 0954           ITP Comments 30 Day review completed. Medical Director ITP review done, changes made as directed, and signed approval by Medical Director.                Comments:

## 2021-10-31 ENCOUNTER — Encounter: Payer: No Typology Code available for payment source | Admitting: *Deleted

## 2021-10-31 DIAGNOSIS — J849 Interstitial pulmonary disease, unspecified: Secondary | ICD-10-CM | POA: Diagnosis not present

## 2021-10-31 NOTE — Progress Notes (Signed)
Daily Session Note  Patient Details  Name: Brian Casey MRN: [id number] Date of Birth: 1950/08/16 Referring Provider:   April [facility]on [facility]monary Rehab from 09/29/2021 in [facility] Cardiac and Pulmonary Rehab  Referring Provider Cyndie Chime MD ([place])  [facility] Pulmonologist Dr. Lanney Gins       Encounter Date: 10/31/2021  Check In:  Session Check In - 10/31/21 1157       Check-In   Staff Present Nyoka Cowden, RN, BSN, [place]den Field, BS, RRT, CPFT;Jessica Luan Pulling, MA, RCEP, CCRP, CCET    Virtual Visit No    Medication changes reported     No    Warm-up and Cool-down Performed on first and last piece of equipment    Resistance Training Performed Yes    VAD Patient? No    PAD/SET Patient? No      Pain Assessment   Currently in Pain? No/denies                Social History   Tobacco Use  Smoking Status Former   Packs/day: 1.00   Years: 48.00   Total pack years: 48.00   Types: Cigarettes   Quit date: 11/07/2015   Years since quitting: 5.9  Smokeless Tobacco Never  Tobacco Comments   quit smoking 11/01/15    Goals Met:  Independence with exercise equipment Exercise tolerated well No report of concerns or symptoms today  Goals Unmet:  Not Applicable  Comments: Pt able to follow exercise prescription today without complaint.  Will continue to monitor for progression.    Dr. Emily Filbert is Medical Director for [place].  Dr. Ottie Glazier is Medical Director for [facility] Pulmonary Rehabilitation.

## 2021-11-03 ENCOUNTER — Encounter: Payer: No Typology Code available for payment source | Admitting: *Deleted

## 2021-11-03 DIAGNOSIS — J849 Interstitial pulmonary disease, unspecified: Secondary | ICD-10-CM | POA: Diagnosis not present

## 2021-11-03 NOTE — Progress Notes (Signed)
Daily Session Note  Patient Details  Name: Brian Casey MRN: [id number] Date of Birth: [birth year] Referring Provider:   April Manson Pulmonary Rehab from 09/29/2021 in [facility] Cardiac and Pulmonary Rehab  Referring Provider Cyndie Chime MD ([place])  [facility] Pulmonologist Dr. Lanney Gins       Encounter Date: 11/03/2021  Check In:  Session Check In - 11/03/21 1113       Check-In   Supervising physician immediately available to respond to emergencies See telemetry face sheet for immediately available ER MD    Location [facility]-Cardiac & Pulmonary Rehab    Staff Present Earlean Shawl, BS, ACSM CEP, Exercise Physiologist;Laureen Owens Shark, BS, RRT, CPFT;Khameron Gruenwald Sherryll Burger, RN BSN    Virtual Visit No    Medication changes reported     No    Fall or balance concerns reported    No    Warm-up and Cool-down Performed on first and last piece of equipment    Resistance Training Performed Yes    VAD Patient? No    PAD/SET Patient? No      Pain Assessment   Currently in Pain? No/denies                Social History   Tobacco Use  Smoking Status Former   Packs/day: 1.00   Years: 48.00   Total pack years: 48.00   Types: Cigarettes   Quit date: 11/07/2015   Years since quitting: 5.9  Smokeless Tobacco Never  Tobacco Comments   quit smoking 11/01/15    Goals Met:  Independence with exercise equipment Exercise tolerated well No report of concerns or symptoms today Strength training completed today  Goals Unmet:  Not Applicable  Comments: Pt able to follow exercise prescription today without complaint.  Will continue to monitor for progression.    Dr. Emily Filbert is Medical Director for [place].  Dr. Ottie Glazier is Medical Director for [facility] Pulmonary Rehabilitation.

## 2021-11-04 ENCOUNTER — Ambulatory Visit (INDEPENDENT_AMBULATORY_CARE_PROVIDER_SITE_OTHER): Payer: No Typology Code available for payment source | Admitting: Internal Medicine

## 2021-11-04 ENCOUNTER — Encounter: Payer: Self-pay | Admitting: Internal Medicine

## 2021-11-04 VITALS — BP 136/80 | HR 75 | Ht 67.0 in | Wt 206.2 lb

## 2021-11-04 DIAGNOSIS — Z01811 Encounter for preprocedural respiratory examination: Secondary | ICD-10-CM

## 2021-11-04 DIAGNOSIS — Z79899 Other long term (current) drug therapy: Secondary | ICD-10-CM

## 2021-11-04 DIAGNOSIS — J9611 Chronic respiratory failure with hypoxia: Secondary | ICD-10-CM

## 2021-11-04 DIAGNOSIS — R0609 Other forms of dyspnea: Secondary | ICD-10-CM | POA: Diagnosis not present

## 2021-11-04 DIAGNOSIS — J849 Interstitial pulmonary disease, unspecified: Secondary | ICD-10-CM | POA: Diagnosis not present

## 2021-11-04 DIAGNOSIS — D84821 Immunodeficiency due to drugs: Secondary | ICD-10-CM

## 2021-11-04 DIAGNOSIS — Z7952 Long term (current) use of systemic steroids: Secondary | ICD-10-CM

## 2021-11-04 NOTE — Patient Instructions (Addendum)
ICD-10-CM   1. ILD (interstitial lung disease) ([place])  J84.9     2. Chronic respiratory failure with hypoxia (HCC)  J96.11     3. DOE (dyspnea on exertion)  R06.09     4. Pre-operative respiratory examination  Z01.811     5. Immunosuppression due to drug therapy ([place])  D84.821    Z79.899     6. 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  .  You could have IPF.  Could also be non-- IPF such as asbestosis or NSIP  You are also desaturating with exertion and requiring 4 L of nasal oxygen to correct suggesting quite advanced disease  Based on the review of scans disease probably started getting worse sometime around the spring 2022 and since then progressively worse  Plan - get HRCT supine and prone - can do at [facility] -Get echocardiogram - can do at [facility] -Get spirometry and DLCO - can do at [facility] -Start 4 L of nasal cannula oxygen with exertion -Test overnight pulse oximetry on room air to decide on nighttime oxygen use  -Based on results we will consider sleep doctor referral -Stop Imuran -Reduce prednisone to 10 mg/day -Refer Dr. Doy Hutching for consideration of surgical lung biopsy  -But you could be considered at risk for complications  -Return to see me in a few weeks open record video visit fine to discuss antifibrotic therapy which I recommend  -But after completing work-up and seeing cardiology on 11/05/2021 -Continue pulmonary rehabilitation for now - change nebulziers to prn  Pre-operative respiratory examination  -Colonoscopy scheduled for November 19, 2021 -Future consideration of umbilical hernia surgery   Plan -Hold off on colonoscopy till echocardiogram and overnight oxygen assessments are completed  -Most likely  can have colonoscopy but will have to be an inpatient setting   Follow-up - Return to see Dr. Chase Caller 30-minute visit in less than 2-3 weeks (video visit(

## 2021-11-04 NOTE — Progress Notes (Signed)
OV 11/04/2021  Subjective:  Patient ID: Brian Casey, male , DOB: 1951/02/05 , age 71 y.o. , MRN: [id number] , ADDRESS: [address] [place] [id number] PCP [name], MD Patient Care Team: [name], MD as PCP - General (Family Medicine) Minna Merritts, MD as Consulting Physician (Cardiology) Dahlia Byes, MD as Consulting Physician (Cardiothoracic Surgery)  This Provider for this visit: Treatment Team:  Attending Provider: Brand Males, MD    11/04/2021 -  hx chart reviwee,patient, wife, ILD questions Chief Complaint  Patient presents with   Consult    Pt has had recent imaging performed which showed diagnosis of ILD. Pt has had complaints of cough and SOB.     HPI Brian Casey 70 y.o. -  Presents with wife. Has hx of heavy asbestos ezposure. Former smoker. In Aug 2017 had acute chole, e colii bcacteremia and NSTEMI and sp CABG. CTA at that time showed some GGO and bialteral efffusion. Then in 2019 June admitted for had pleuirsy and s/p thora 1L (rad review confirms that). LHD 579. CUtlure negatiuve. WAS EOSINOPHLIC 78%. C at that the time considered as ILD by chest radiology. Also had AKI creat 1.79m%. Then started following with Dr FRaul Delin BWenona Says he was on surveillance approach. No thora ever after that. In April 2022 he had CT - the ILD for first time started getting worse but he was feeling fine. In June 2022 started seeing Dr ALanney Gins ARound this time also had Covid. In Nov 2022 had another CT cchest  ILD progressed but stil feeling ok. In Jan 2023 developed cough, ACE inhibitor got changed to ARB and sometime later got started in pred/immuran as well for his ILD. Then developed flu like symptoms. Both ARB and immuran g t stopped and he got better. Immuran restarted at lower dose at 521mper day in May 2023. CT at this time showed worsening ILD. 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 Prednisone at 2048mer day. No more flu symptoms.  Feels side effect was ARB related. Despte immune suppression regimen he feels he is declining more dysneic. Still on room air at home and started rehab few days ago. Noticed in rehab needing 4L [place] to exert  (also confirmed here this visit). In addition prednisone is causing insomina and fatigue. He feels this med regimen is not helping  Of note he has upcoing endoscopy and colonoscop with Dr TolAlice Reichert30/23. Also has a verntral hernia for which he wants surgery. Overall he and his wife are very concerned about this ILD   He is also on Inhaler Rx and not sure it is helOrthoptisttegrated Comprehensive ILD Questionnaire  Symptoms:  gradally getting worse x 6 months. Cough x 6 months only but CT actually getting worse Apr 2022 -> May 2023. Alterntive to UIP pattenr  SYMPTOM SCALE - ILD 11/04/2021  Current weight   O2 use RA but needing 4L [place] with exertin  Shortness of Breath 0 -> 5 scale with 5 being worst (score 6 If unable to do)  At rest 0  Simple tasks - showers, clothes change, eating, shaving 4  Household (dishes, doing bed, laundry) 4  Shopping 3  Walking level at own pace 3  Walking up Stairs 5  Total (30-36) Dyspnea Score 19      Non-dyspnea symptoms (0-> 5 scale) 11/04/2021  How bad is your cough? 2.5  How bad is your fatigue 3  How bad is nausea 0  How bad is vomiting?  0  How bad is diarrhea? 0  How bad is anxiety? 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  No clear-cut evidence of ILD:  2019 June CT scan of the chest: Read by Dr. Polly Cobia.  Concerning for ILD.  Reticulation also left-sided pleural effusion  May 2021 high-resolution CT chest: Read by Dr. Polly Cobia.  Indeterminate pattern for UIP versus alternate diagnosis.Marland Kitchen  Definite of ILD.  There is subpleural sparing.  Left-sided pleural effusion present.  Findings deemed is unchanged from prior.  I personally agree with the findings  April 2022 CT chest without contrast [not a high-resolution CT chest].  Personally thought ILD is worse.  Radiology seems to agree  November 2022 in May 2023 CT scan of the chest [again not high-resolution] definite worsening of ILD changes.  Increased groundglass component.  May 2023: CT - progressive ILD. Worsning GGO and reticulation. Trace left effuion  No results found.    PFT     Latest Ref Rng & Units 11/11/2015    7:38 AM  PFT Results  FVC-Pre L 1.20   FVC-Predicted Pre % 28   FVC-Post L 1.35   FVC-Predicted Post % 32   Pre FEV1/FVC % % 60   Post FEV1/FCV % % 56   FEV1-Pre L 0.72   FEV1-Predicted Pre % 22   FEV1-Post L 0.76   DLCO uncorrected ml/min/mmHg 8.93   DLCO UNC% % 31   DLCO corrected  ml/min/mmHg 10.84   DLCO COR %Predicted % 38   DLVA Predicted % 119   TLC L 2.47   TLC % Predicted % 38   RV % Predicted % 61        has a past medical history of Acute renal failure ([place]) (11/06/2015), Adenomatous colon polyp, Anxiety, Arthritis, Depression, Diverticulosis, GERD (gastroesophageal reflux disease), High cholesterol, History of bronchitis, Hypertension (10/17/2013), Hyperthyroidism, Hypothyroidism, New onset atrial fibrillation ([place]) (11/06/2015), NSTEMI (non-ST elevated myocardial infarction) ([place]) (11/07/2015), Thyroid disease, Type II diabetes mellitus ([place]), and Wears glasses.   reports that he quit smoking about 5 years ago. His smoking use included cigarettes. He has a 48.00 pack-year smoking history. He has never used smokeless  tobacco.  Past Surgical History:  Procedure Laterality Date   CARDIAC CATHETERIZATION Right 11/06/2015   Procedure: Left Heart Cath and Coronary Angiography;  Surgeon: Minna Merritts, MD;  Location: [place] [facility];  Service: Cardiovascular;  Laterality: Right;   CHOLECYSTECTOMY N/A 01/17/2016   Procedure: LAPAROSCOPIC CHOLECYSTECTOMY WITH INTRAOPERATIVE CHOLANGIOGRAM;  Surgeon: Fanny Skates, MD;  Location: [place];  Service: General;  Laterality: N/A;   COLONOSCOPY     CORONARY ARTERY BYPASS GRAFT N/A 11/14/2015   Procedure: CORONARY ARTERY BYPASS GRAFTING (CABG) x 4;  Surgeon: Ivin Poot, MD;  Location: [place];  Service: Open Heart Surgery;  Laterality: N/A;   ENDARTERECTOMY Left 11/14/2015   Procedure: LEFT ENDARTERECTOMY CAROTID;  Surgeon: Waynetta Sandy, MD;  Location: [place];  Service: Vascular;  Laterality: Left;   FINGER GANGLION CYST EXCISION Left X 3   "index finger X 2; thumb X 1"   [place] N/A 11/16/2013   Procedure: INSERTION OF MESH;  Surgeon: Imogene Burn. Georgette Dover, MD;  Location: [place];  Service: General;  Laterality: N/A;   IR GENERIC HISTORICAL  11/08/2015    IR PERC CHOLECYSTOSTOMY 11/08/2015 Corrie Mckusick, DO [facility]-INTERV RAD   IR GENERIC HISTORICAL  12/13/2015   IR CHOLANGIOGRAM EXISTING TUBE 12/13/2015 [facility]-INTERV RAD   IR GENERIC HISTORICAL  12/25/2015   IR CATHETER TUBE CHANGE 12/25/2015 Sandi Mariscal, MD [facility]   IR GENERIC HISTORICAL  12/12/2015   IR RADIOLOGIST EVAL & MGMT 12/12/2015 Greggory Keen, MD [facility]   TEE WITHOUT CARDIOVERSION N/A 11/14/2015   Procedure: TRANSESOPHAGEAL ECHOCARDIOGRAM (TEE);  Surgeon: Ivin Poot, MD;  Location: [place];  Service: Open Heart Surgery;  Laterality: N/A;   UMBILICAL HERNIA REPAIR N/A 11/16/2013   Procedure: UMBILICAL HERNIA REPAIR;  Surgeon: Imogene Burn. Georgette Dover, MD;  Location: [place];  Service: General;  Laterality: N/A;   VEIN HARVEST Right 11/14/2015   Procedure: RIGHT LEG GREATER SAPHENOUS VEIN HARVEST;  Surgeon: Ivin Poot, MD;  Location: [place];  Service: Open Heart Surgery;  Laterality: Right;    Allergies  Allergen Reactions   Penicillins Nausea And Vomiting and Other (See Comments)    REACTION: Vomiting Has patient had a PCN reaction causing immediate rash, facial/tongue/throat swelling, SOB or lightheadedness with hypotension: Yes Has patient had a PCN reaction causing severe rash involving mucus membranes or skin necrosis: No Has patient had a PCN reaction that required hospitalization No Has patient had a PCN reaction occurring within the last 10 years: No If all of the above answers are "NO", then may proceed with Cephalosporin use.   Amlodipine Swelling   Bupropion    Bupropion Hcl     REACTION: Severe constipation, insomnia   Chlorthalidone Other (See Comments)    Other reaction(s): Hyponatremia   Metformin Diarrhea   Terazosin Other (See Comments)    Immunization History  Administered Date(s) Administered   Influenza Split 01/26/2011, 02/10/2012   Influenza Whole 01/22/2004, 02/05/2008, 12/26/2008   Influenza,inj,Quad PF,6+ Mos 12/29/2012, 01/10/2014, 12/11/2014    Influenza-Unspecified 12/25/2016, 12/22/2017, 12/21/2018, 12/07/2019, 12/24/2020   PFIZER Comirnaty(Gray Top)Covid-19 Tri-Sucrose Vaccine 04/09/2019, 05/02/2019, 12/28/2019, 01/01/2021   Pneumococcal Conjugate-13 03/29/2014, 11/06/2014, 11/15/2017   Pneumococcal Polysaccharide-23 11/05/2016   Td 08/22/1998, 08/18/2013   Zoster, Live 03/29/2014    Family History  Problem Relation Age of Onset   Hypertension Mother    Diabetes Mother    Depression Mother    Colon  cancer Mother    Hyperthyroidism Father    Hypertension Father    Prostate cancer Father    Diabetes Maternal Grandmother    Lung cancer Maternal Grandmother        non smoker     Current Outpatient Medications:    albuterol (VENTOLIN HFA) 108 (90 Base) MCG/ACT inhaler, SMARTSIG:2 inhalation Via Inhaler Every 4 Hours PRN, Disp: , Rfl:    ALPRAZolam (XANAX) 0.25 MG tablet, Take by mouth., Disp: , Rfl:    aspirin EC 81 MG tablet, Take 81 mg by mouth daily. Swallow whole., Disp: , Rfl:    atorvastatin (LIPITOR) 40 MG tablet, Take 40 mg by mouth daily., Disp: , Rfl:    budesonide (PULMICORT) 0.25 MG/2ML nebulizer solution, INHALE CONTENTS OF ONE VIAL/NEBULE VIA NEBULIZER TWO TIMES A DAY, Disp: , Rfl:    carvedilol (COREG) 6.25 MG tablet, Take 1 tablet (6.25 mg total) by mouth 2 (two) times daily., Disp: 60 tablet, Rfl: 2   furosemide (LASIX) 20 MG tablet, Take 20 mg by mouth daily., Disp: , Rfl:    ipratropium-albuterol (DUONEB) 0.5-2.5 (3) MG/3ML SOLN, Take by nebulization., Disp: , Rfl:    levothyroxine (SYNTHROID, LEVOTHROID) 100 MCG tablet, Take 1 tablet (100 mcg total) by mouth daily before breakfast. OFFICE VISIT WITH  LABS REQUIRED FOR ADDITIONAL REFILLS (Patient taking differently: Take 112 mcg by mouth daily before breakfast. OFFICE VISIT WITH  LABS REQUIRED FOR ADDITIONAL REFILLS), Disp: 30 tablet, Rfl: 0   omeprazole (PRILOSEC) 20 MG capsule, Take 1 capsule (20 mg total) by mouth daily. OFFICE VISIT REQUIRED FOR  ADDITIONAL REFILLS, Disp: 30 capsule, Rfl: 0   predniSONE (DELTASONE) 20 MG tablet, , Disp: , Rfl:    tadalafil (CIALIS) 5 MG tablet, Take 1 tablet by mouth as needed., Disp: , Rfl:       Objective:   Vitals:   11/04/21 1620  BP: 136/80  Pulse: 75  SpO2: 94%  Weight: 206 lb 3.2 oz (93.5 kg)  Height: _0  (1.702 m)    Estimated body mass index is 32.3 kg/m as calculated from the following:   Height as of this encounter: _1  (1.702 m).   Weight as of this encounter: 206 lb 3.2 oz (93.5 kg).  _2 @  Filed Weights   11/04/21 1620  Weight: 206 lb 3.2 oz (93.5 kg)     Physical Exam    General: No distress. Obese. BEARD Neuro: Alert and Oriented x 3. GCS 15. Speech normal Psych: Pleasant Resp:  Barrel Chest - no.  Wheeze - no, Crackles - YEs base, No overt respiratory distress CVS: Normal heart sounds. Murmurs - no Ext: Stigmata of Connective Tissue Disease - no HEENT: Normal upper airway. PEERL +. No post nasal drip. MaLLAMPATTI class 3        Assessment:       ICD-10-CM   1. ILD (interstitial lung disease) (HCC)  J84.9 Pulse oximetry, overnight    Ambulatory Referral for DME    ECHOCARDIOGRAM COMPLETE    Pulmonary Function Test [facility] Only    Ambulatory referral to Cardiothoracic Surgery    CT Chest High Resolution    CANCELED: Ambulatory Referral for DME    2. Chronic respiratory failure with hypoxia (HCC)  J96.11 Pulse oximetry, overnight    Ambulatory Referral for DME    CANCELED: Ambulatory Referral for DME    3. DOE (dyspnea on exertion)  R06.09 ECHOCARDIOGRAM COMPLETE    4. Pre-operative respiratory examination  Z01.811  5. Immunosuppression due to drug therapy ([place])  D84.821    Z79.899     6. Current chronic use of systemic steroids  Z79.52      Radiologically ILD is been getting worse since April 2022.  Possibly present since at least 2017.    Plan:     Patient Instructions     ICD-10-CM   1. ILD (interstitial lung disease)  ([place])  J84.9     2. Chronic respiratory failure with hypoxia (HCC)  J96.11     3. DOE (dyspnea on exertion)  R06.09     4. Pre-operative respiratory examination  Z01.811     5. Immunosuppression due to drug therapy ([place])  D84.821    Z79.899     6. 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  .  You could have IPF.  Could also be non-- IPF such as asbestosis or NSIP  You are also desaturating with exertion and requiring 4 L of nasal oxygen to correct suggesting quite advanced disease  Based on the review of scans disease probably started getting worse sometime around the spring 2022 and since then progressively worse  Plan - get HRCT supine and prone - [facility] do at [facility] -Get echocardiogram - can do at [facility] -Get spirometry and DLCO - can do at [facility] -Start 4 L of nasal cannula oxygen with exertion -Test overnight pulse oximetry on room air to decide on nighttime oxygen use  -Based on results we will consider sleep doctor referral -Stop Imuran -Reduce prednisone to 10 mg/day -Refer Dr. Doy Hutching for consideration of surgical lung biopsy  -But you could be considered at risk for complications  -Return to see me in a few weeks open record video visit fine to discuss antifibrotic therapy which I recommend  -But after completing work-up and seeing cardiology on 11/05/2021 -Continue pulmonary rehabilitation for now - change nebulziers to prn  Pre-operative respiratory examination  -Colonoscopy scheduled for November 19, 2021 -Future consideration of umbilical hernia surgery   Plan -Hold off on colonoscopy till echocardiogram and overnight oxygen assessments are completed  -Most likely can have colonoscopy but will have to be an  inpatient setting   Follow-up - Return to see Dr. Chase Caller 30-minute visit in less than 2-3 weeks (video visit(     SIGNATURE    Dr. Brand Males, M.D., F.C.C.P,  Pulmonary and Critical Care Medicine Staff Physician, [place] Director - Interstitial Lung Disease  Program  Pulmonary [place] at [place], [place], [address]  Pager: [phone], If no answer or between  15:00h - 7:00h: call [phone] Telephone: [phone]  5:43 PM 11/04/2021

## 2021-11-05 ENCOUNTER — Encounter: Payer: Self-pay | Admitting: Cardiovascular Disease

## 2021-11-05 ENCOUNTER — Ambulatory Visit (INDEPENDENT_AMBULATORY_CARE_PROVIDER_SITE_OTHER): Payer: No Typology Code available for payment source | Admitting: Cardiovascular Disease

## 2021-11-05 ENCOUNTER — Telehealth: Payer: Self-pay | Admitting: Internal Medicine

## 2021-11-05 VITALS — BP 128/70 | HR 67 | Ht 67.5 in | Wt 207.4 lb

## 2021-11-05 DIAGNOSIS — I25118 Atherosclerotic heart disease of native coronary artery with other forms of angina pectoris: Secondary | ICD-10-CM

## 2021-11-05 DIAGNOSIS — I10 Essential (primary) hypertension: Secondary | ICD-10-CM | POA: Diagnosis not present

## 2021-11-05 DIAGNOSIS — E782 Mixed hyperlipidemia: Secondary | ICD-10-CM | POA: Diagnosis not present

## 2021-11-05 DIAGNOSIS — I480 Paroxysmal atrial fibrillation: Secondary | ICD-10-CM

## 2021-11-05 DIAGNOSIS — I6522 Occlusion and stenosis of left carotid artery: Secondary | ICD-10-CM

## 2021-11-05 NOTE — Patient Instructions (Addendum)
Medication Instructions:  No changes  If you need a refill on your cardiac medications before your next appointment, please call your pharmacy.   Lab work: No new labs needed  Testing/Procedures:  Your physician has requested that you have an echocardiogram. Echocardiography is a painless test that uses sound waves to create images of your heart. It provides your doctor with information about the size and shape of your heart and how well your heart's chambers and valves are working. This procedure takes approximately one hour. There are no restrictions for this procedure.   Your physician has requested that you have a carotid duplex in March of 2024. This test is an ultrasound of the carotid arteries in your neck. It looks at blood flow through these arteries that supply the brain with blood. Allow one hour for this exam. There are no restrictions or special instructions.   Follow-Up: At [facility], you and your health needs are our priority.  As part of our continuing mission to provide you with exceptional heart care, we have created designated Provider Care Teams.  These Care Teams include your primary Cardiologist (physician) and Advanced Practice Providers (APPs -  Physician Assistants and Nurse Practitioners) who all work together to provide you with the care you need, when you need it.  You will need a follow up appointment in 7 months after carotid ultrasound  Providers on your designated Care Team:   Murray Hodgkins, NP Christell Faith, PA-C Cadence Kathlen Mody, [place]  COVID-19 Vaccine Information can be found at: [email or web address] For questions related to vaccine distribution or appointments, please email [email or web address] or call [phone].

## 2021-11-05 NOTE — Progress Notes (Addendum)
Cardiology Office Note  Date:  11/05/2021   ID:  Brian Casey, DOB [birth year], MRN [id number]  PCP:  [name], MD   Chief Complaint  Patient presents with   New Patient (Initial Visit)    Patient has been treated by the [facility] in the past, hasn't had any cardiac texting in 3 years. Dr. Chase Caller referred the patient to be evaluated for a history of CAD, the patient recently diagnosed with ILD. Patient c/o shortness of breath with little to no exertion and has bilateral LE edema.     HPI:  Brian Casey is a 71 y.o. 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 Progression of interstitial lung disease. Pattern suggests nonspecific interstitial pneumonia or idiopathic interstitial pneumonia. 2. Coronary artery calcifications and aortic atherosclerosis, stable.  [facility] 60-79% on the right  Doing lung works priogram Has had episodes of Hyopoxia on exertion, scheduled to start oxygen Denies chest pain on exertion concerning for angina Weaning off prednisone  Lab work reviewed Total chol 117, ldl 53 A1c 6.5, stable  Wore a monitor at the [place], no atrial fib Was taken off eliquis by the [place]  EKG personally reviewed by myself on todays visit Normal sinus rhythm rate 67 bpm no significant ST-T wave changes  Other past medical history reviewed  presented to [facility] on 11/04/15 with a 3-day history of RUQ pain  CT abdomen/pelvis that showed  acute acalculus cholecystitis.  noted to be in new onset Afib with RVR His troponin peaked at 0.11. His Afib was rate controlled and ultimately converted to sinus rhythm on IV amiodarone.   Echo on 8/14 showed an EF of 50-55%, no RWMA, GR2DD, PASP normal.  He underwent Lexiscan Myoview on 11/06/15 that was high-risk with moderate sized region of moderate intensity in the lateral wall, EF 68%.  LHC showed severe 4-vessel CAD.   transferred to [facility] for cardiac bypass surgery.  He underwent successful 4-vessel CABG on 11/14/2015 with LIMA to LAD, SVG to Diag, SVG to RI, SVG to PDA.    found to have severe LICA stenosis at 00-92% and underwent successful left carotid endarterectomy on 8/24. He had 60-79% RICA stenosis.   gallbladder drain placed  PMH:   has a past medical history of Acute renal failure ([place]) (11/06/2015), Adenomatous colon polyp, Anxiety, Arthritis, Depression, Diverticulosis, GERD (gastroesophageal reflux disease), High cholesterol, History of bronchitis, Hypertension (10/17/2013), Hyperthyroidism, Hypothyroidism, New onset atrial fibrillation ([place]) (11/06/2015), NSTEMI (non-ST elevated myocardial infarction) ([place]) (11/07/2015), Thyroid disease, Type II diabetes mellitus ([place]), and Wears glasses.  PSH:    Past Surgical History:  Procedure Laterality Date   CARDIAC CATHETERIZATION Right 11/06/2015   Procedure: Left Heart Cath and Coronary Angiography;  Surgeon: Minna Merritts, MD;  Location: [place] CV [facility];  Service: Cardiovascular;  Laterality: Right;   CHOLECYSTECTOMY N/A 01/17/2016   Procedure: LAPAROSCOPIC CHOLECYSTECTOMY WITH INTRAOPERATIVE CHOLANGIOGRAM;  Surgeon: Fanny Skates, MD;  Location: [facility]  OR;  Service: General;  Laterality: N/A;   COLONOSCOPY     CORONARY ARTERY BYPASS GRAFT N/A 11/14/2015   Procedure: CORONARY ARTERY BYPASS GRAFTING (CABG) x 4;  Surgeon: Ivin Poot, MD;  Location: [place];  Service: Open Heart Surgery;  Laterality: N/A;   ENDARTERECTOMY  Left 11/14/2015   Procedure: LEFT ENDARTERECTOMY CAROTID;  Surgeon: Waynetta Sandy, MD;  Location: [place];  Service: Vascular;  Laterality: Left;   FINGER GANGLION CYST EXCISION Left X 3   "index finger X 2; thumb X 1"   [place] N/A 11/16/2013   Procedure: INSERTION OF MESH;  Surgeon: Imogene Burn. Georgette Dover, MD;  Location: [place];  Service: General;  Laterality: N/A;   IR GENERIC HISTORICAL  11/08/2015   IR PERC CHOLECYSTOSTOMY 11/08/2015 Corrie Mckusick, DO [facility]-INTERV RAD   IR GENERIC HISTORICAL  12/13/2015   IR CHOLANGIOGRAM EXISTING TUBE 12/13/2015 [facility]-INTERV RAD   IR GENERIC HISTORICAL  12/25/2015   IR CATHETER TUBE CHANGE 12/25/2015 Sandi Mariscal, MD [facility]INTER[facility] RAD   IR GENERIC HISTORICAL  12/12/2015   IR RADIOLOGIST EVAL & MGMT 12/12/2015 Greggory Keen, MD [facility] INTERV RAD   TEE WITHOUT CARDIOVERSION N/A 11/14/2015   Procedure: TRANSESOPHAGEAL ECHOCARDIOGRAM (TEE);  Surgeon: Ivin Poot, MD;  Location: [place];  Service: Open Heart Surgery;  Laterality: N/A;   UMBILICAL HERNIA REPAIR N/A 11/16/2013   Procedure: UMBILICAL HERNIA REPAIR;  Surgeon: Imogene Burn. Georgette Dover, MD;  Location: [place];  Service: General;  Laterality: N/A;   VEIN HARVEST Right 11/14/2015   Procedure: RIGHT LEG GREATER SAPHENOUS VEIN HARVEST;  Surgeon: Ivin Poot, MD;  Location: [place];  Service: Open Heart Surgery;  Laterality: Right;    Current Outpatient Medications  Medication Sig Dispense Refill   ALPRAZolam (XANAX) 0.25 MG tablet Take by mouth.     aspirin EC 81 MG tablet Take 81 mg by mouth daily. Swallow whole.     atorvastatin (LIPITOR) 40 MG tablet Take 40 mg by mouth daily.     carvedilol (COREG) 12.5 MG tablet Take 12.5 mg by mouth 2 (two) times daily with a meal.     furosemide (LASIX) 20 MG tablet Take 20 mg by mouth daily.     levothyroxine (SYNTHROID) 100 MCG tablet Take 100 mcg by mouth daily before breakfast.     omeprazole (PRILOSEC) 20 MG capsule Take  1 capsule (20 mg total) by mouth daily. OFFICE VISIT REQUIRED FOR ADDITIONAL REFILLS 30 capsule 0   predniSONE (DELTASONE) 20 MG tablet      tadalafil (CIALIS) 5 MG tablet Take 1 tablet by mouth as needed.     albuterol (VENTOLIN HFA) 108 (90 Base) MCG/ACT inhaler SMARTSIG:2 inhalation Via Inhaler Every 4 Hours PRN (Patient not taking: Reported on 11/05/2021)     budesonide (PULMICORT) 0.25 MG/2ML nebulizer solution INHALE CONTENTS OF ONE VIAL/NEBULE VIA NEBULIZER TWO TIMES A DAY (Patient not taking: Reported on 11/05/2021)     ipratropium-albuterol (DUONEB) 0.5-2.5 (3) MG/3ML SOLN Take by nebulization. (Patient not taking: Reported on 11/05/2021)     levothyroxine (SYNTHROID, LEVOTHROID) 100 MCG tablet Take 1 tablet (100 mcg total) by mouth daily before breakfast. OFFICE VISIT WITH  LABS REQUIRED FOR ADDITIONAL REFILLS (Patient not taking: Reported on 11/05/2021) 30 tablet 0   No current facility-administered medications for this visit.     Allergies:   Penicillins, Amlodipine, Bupropion, Bupropion hcl, Chlorthalidone, Metformin, and Terazosin   Social History:  The patient  reports that he quit smoking about 6 years ago. His smoking use included cigarettes. He has a 48.00 pack-year smoking history. He has never used smokeless tobacco. He reports current alcohol use of about 3.0 standard drinks of alcohol per week. He reports that he does not use drugs.   Family History:   family history includes Colon cancer in his mother; Depression in his mother; Diabetes in his maternal grandmother and mother; Hypertension in his father and mother; Hyperthyroidism in his father; Lung cancer in his maternal grandmother; Prostate cancer in his father.    Review of Systems: Review of Systems  Constitutional: Negative.   Respiratory: Negative.    Cardiovascular: Negative.   Gastrointestinal: Negative.   Musculoskeletal: Negative.   Neurological:  Positive for dizziness.  Psychiatric/Behavioral: Negative.     All other systems reviewed and are negative.   PHYSICAL EXAM: VS:  BP 128/70 (BP Location: Left Arm, Patient Position: Sitting, Cuff Size: Normal)   Pulse 67   Ht 5' 7.5" (1.715 m)   Wt 207 lb 6 oz (94.1 kg)   SpO2 90%   BMI 32.00 kg/m  , BMI Body mass index is 32 kg/m. Constitutional:  oriented to person, place, and time. No distress.  HENT:  Head: Grossly normal Eyes:  no discharge. No scleral icterus.  Neck: No JVD, no carotid bruits  Cardiovascular: Regular rate and rhythm, no murmurs appreciated Pulmonary/Chest: Mildly decreased breath sounds, no wheezes Diffuse Rales left greater than right Abdominal: Soft.  no distension.  no tenderness.  Musculoskeletal: Normal range of motion Neurological:  normal muscle tone. Coordination normal. No atrophy Skin: Skin warm and dry Psychiatric: normal affect, pleasant  Recent Labs: 02/12/2021: Creatinine, Ser 1.40    Lipid Panel Lab Results  Component Value Date   CHOL 150 10/30/2014   HDL 42.30 10/30/2014   LDLCALC 88 10/30/2014   TRIG 437 (H) 11/09/2015      Wt Readings from Last 3 Encounters:  11/05/21 207 lb 6 oz (94.1 kg)  11/04/21 206 lb 3.2 oz (93.5 kg)  09/29/21 206 lb (93.4 kg)      ASSESSMENT AND PLAN:  Coronary artery disease with stable angina Denies chest pain concerning for angina, shortness of breath progressive in the setting of ILD.  Echocardiogram scheduled .  We will hold off on ischemic work-up at this time . continue current medication regimen.  Chronic respiratory distress/ILD Diagnosis of interstitial lung disease, advanced disease, followed by pulmonary Scheduled to start on oxygen Weaning off prednisone, possible goal biopsy in the future Imuran held Overnight oximetry pending Echocardiogram ordered to evaluate right heart pressures  PAF (paroxysmal atrial fibrillation) (HCC) CHADs VASC of 5  Had a monitor at the [place], was told he did not need to be on Eliquis and this was held by outside  cardiology  Coronary artery disease involving native coronary artery of native heart with angina pectoris with documented spasm ([place]) Currently with no symptoms of angina. No further workup at this time. Continue current medication regimen.  Aortic atherosclerosis (HCC) Cholesterol at goal  Stenosis of left carotid artery History of carotid endarterectomy  Uncontrolled type 2 diabetes mellitus with other circulatory complication, without long-term current use of insulin (HCC) Dramatic climb in weight over the past several months, 30 pounds We have encouraged continued exercise, careful diet management in an effort to lose weight.  S/P CABG x 4 Denies anginal symptoms, prior surgery 2017  PAD Prior carotid endarterectomy on the left, 60 to 79%  disease on the right per his recollection.  Ultrasounds previously done at the [facility], prefers to have them done locally, carotid ultrasound ordered at his request March 2024   Alcohol abuse Prior history   Total encounter time more than 60 minutes  Greater than 50% was spent in counseling and coordination of care with the patient    Orders Placed This Encounter  Procedures   EKG 12-Lead     Signed, Esmond Plants, M.D., Ph.D. 11/05/2021  [place], [place]

## 2021-11-05 NOTE — Telephone Encounter (Signed)
Hi Dr Alice Reichert  I thnk end of month you are planning endoscopy and colonoscopy. He is Obese +/- undiagnosed OSA. Has ILD. Needing 4L [place] with exertion. So, recommend that his echo is ok and he has it in hospital under anesthesia  Thanks    SIGNATURE    Dr. Brand Males, M.D., F.C.C.P,  Pulmonary and Critical Care Medicine Staff Physician, [place] Director - Interstitial Lung Disease  Program  Medical Director - [facility] Pulmonary [place] at [place], [place], [address]  NPI Number:  NPI #[id number] [facility] Number: [id number]  Pager: [phone], If no answer  -[place] or [place] [phone] Telephone (clinical office): [phone] Telephone (research): [phone]  6:25 PM 11/05/2021

## 2021-11-06 ENCOUNTER — Encounter: Payer: No Typology Code available for payment source | Admitting: *Deleted

## 2021-11-06 DIAGNOSIS — J849 Interstitial pulmonary disease, unspecified: Secondary | ICD-10-CM

## 2021-11-06 NOTE — Progress Notes (Signed)
Daily Session Note  Patient Details  Name: Brian Casey MRN: [id number] Date of Birth: 1951/02/19 Referring Provider:   Flowsheet Row Pulmonary Rehab from 09/29/2021 in [facility] Cardiac and Pulmonary Rehab  Referring Provider Cyndie Chime MD ([facility])  [facility] Pulmonologist Dr. Lanney Gins       Encounter Date: 11/06/2021  Check In:  Session Check In - 11/06/21 1115       Check-In   Supervising physician immediately available to respond to emergencies See telemetry face sheet for immediately available ER MD    Location [facility]-Cardiac & Pulmonary Rehab    Staff Present Heath Lark, RN, BSN, Jacklynn Bue, MS, ASCM CEP, Exercise Physiologist;Melissa Lucas, RDN, LDN;Meredith Sherryll Burger, RN BSN;Joseph Hood, RCP,RRT,BSRT    Virtual Visit No    Medication changes reported     No    Fall or balance concerns reported    No    Warm-up and Cool-down Performed on first and last piece of equipment    Resistance Training Performed Yes    VAD Patient? No    PAD/SET Patient? No      Pain Assessment   Currently in Pain? No/denies                Social History   Tobacco Use  Smoking Status Former   Packs/day: 1.00   Years: 48.00   Total pack years: 48.00   Types: Cigarettes   Quit date: 11/07/2015   Years since quitting: 6.0  Smokeless Tobacco Never    Goals Met:  Proper associated with RPD/PD & O2 Sat Independence with exercise equipment Exercise tolerated well No report of concerns or symptoms today  Goals Unmet:  Not Applicable  Comments: Pt able to follow exercise prescription today without complaint.  Will continue to monitor for progression.    Dr. Emily Filbert is Medical Director for [place].  Dr. Ottie Glazier is Medical Director for [facility] Pulmonary Rehabilitation.

## 2021-11-07 ENCOUNTER — Encounter: Payer: No Typology Code available for payment source | Admitting: *Deleted

## 2021-11-07 ENCOUNTER — Ambulatory Visit (INDEPENDENT_AMBULATORY_CARE_PROVIDER_SITE_OTHER): Payer: No Typology Code available for payment source | Admitting: Internal Medicine

## 2021-11-07 DIAGNOSIS — J849 Interstitial pulmonary disease, unspecified: Secondary | ICD-10-CM

## 2021-11-07 NOTE — Progress Notes (Signed)
Interstitial Lung Disease Multidisciplinary Conference   Brian Casey    MRN [id number]    DOB [birth year]  Primary Care Physician:Hedrick, Jeneen Rinks, MD  Referring Physician: Dr Chase Caller  Time of Conference: 7.30am- 8.30am Date of conference: 11/07/2021  Location of Conference: -  Virtual  Participating Pulmonary: Dr. Brand Males, MD - yes,  Dr Marshell Garfinkel, MD - no Pathology: Dr Jaquita Folds, MD - yes , Dr Enid Cutter - no Radiology: Dr Salvatore Marvel MD - no, Dr Vinnie Langton MD - no,  Dr Lorin Picket, MD - no , Dr Eddie Candle - no. Dr Burt Ek - yes  Brief History: Had significant asbestos exposure in the 1980s and 1990s.  Has ILD at least since 2019.  Progressive symptoms since early 2023.  Serology: Negative  MDD discussion of CT scan    - Date or time period of scan: 2017 2019 2021 and 2022 in 2023 - In 2019 early ILD with reticuation. SIgnifcant progressiont hrough latest CT in May 2023. Lot of upper lobe > Lower lobe back then and now. OVerall pattern: ALTERNATIVE CATEGRORy. DDx is FIBROTIC HP (2021 Exp phase imaging was inadequate , 2020 there was mild air trappng). In May 2023 - more GGO  ? flare. Overall patch peribronchosvascular. NOT c/w UIP. Has pleural thicekning on left but prob due to cardiac urgery. No calcifiied pleural plaques  - Features mentioned:  -  - What is the final conclusion per 2018 ATS/Fleischner Criteria -not consistent with UIP.  Progressive phenotype.  Upper lobe predominant.  Dr. Burt Ek suspect fibrotic NSIP or potentially even HP.  In one of the scans there was some mild air trapping.  Pathology discussion of biopsy no pathology:  PFTs:     Latest Ref Rng & Units 11/11/2015    7:38 AM  PFT Results  FVC-Pre L 1.20   FVC-Predicted Pre % 28   FVC-Post L 1.35   FVC-Predicted Post % 32   Pre FEV1/FVC % % 60   Post FEV1/FCV % % 56   FEV1-Pre L 0.72   FEV1-Predicted Pre % 22   FEV1-Post L 0.76   DLCO uncorrected  ml/min/mmHg 8.93   DLCO UNC% % 31   DLCO corrected ml/min/mmHg 10.84   DLCO COR %Predicted % 38   DLVA Predicted % 119   TLC L 2.47   TLC % Predicted % 38   RV % Predicted % 61      Labs:   MDD Impression/Recs: Get high-resolution CT chest.  Consider lung biopsy if risk is appropriate   Time Spent in preparation and discussion:  > 30 min    SIGNATURE   Dr. Brand Males, M.D., F.C.C.P,  Pulmonary and Critical Care Medicine Staff Physician, [place] Director - Interstitial Lung Disease  Program  Pulmonary [place] at [place], [place], [address]  Pager: [phone], If no answer or between  15:00h - 7:00h: call [phone] Telephone: [phone]  5:38 PM 11/07/2021 ...................................................................................................................Marland Kitchen References: Diagnosis of Hypersensitivity Pneumonitis in Adults. An Official ATS/JRS/ALAT Clinical Practice Guideline. Ragu G et al, [place] Aug 1;202(3):e36-e69.       Diagnosis of Idiopathic Pulmonary Fibrosis. An Official ATS/ERS/JRS/ALAT Clinical Practice Guideline. Raghu G et al, [place]. 2018 Sep 1;198(5):e44-e68.   IPF Suspected   Histopath ology Pattern      UIP  Probable UIP  Indeterminate for  UIP  Alternative  diagnosis    UIP  IPF  IPF  IPF  Non-IPF dx   HRCT   Probabe UIP  IPF  IPF  IPF (Likely)**  Non-IPF dx  Pattern  Indeterminate for UIP  IPF  IPF (Likely)**  Indeterminate  for IPF**  Non-IPF dx    Alternative diagnosis  IPF (Likely)**/ non-IPF dx  Non-IPF dx  Non-IPF dx  Non-IPF dx     Idiopathic pulmonary fibrosis diagnosis based upon HRCT and Biopsy paterns.  ** IPF is the likely diagnosis when any of following features are present:  Moderate-to-severe traction bronchiectasis/bronchiolectasis (defined as mild traction  bronchiectasis/bronchiolectasis in four or more lobes including the lingual as a lobe, or moderate to severe traction bronchiectasis in two or more lobes) in a man over age 69 years or in a woman over age 69 years Extensive (>30%) reticulation on HRCT and an age >69 years  Increased neutrophils and/or absence of lymphocytosis in BAL fluid  Multidisciplinary discussion reaches a confident diagnosis of IPF.   **Indeterminate for IPF  Without an adequate biopsy is unlikely to be IPF  With an adequate biopsy may be reclassified to a more specific diagnosis after multidisciplinary discussion and/or additional consultation.   dx = diagnosis; HRCT = high-resolution computed tomography; IPF = idiopathic pulmonary fibrosis; UIP = usual interstitial pneumonia.

## 2021-11-07 NOTE — Progress Notes (Signed)
Daily Session Note  Patient Details  Name: Brian Casey MRN: [id number] Date of Birth: [birth year] Referring Provider:   April Manson Pulmonary Rehab from 09/29/2021 in [facility] Cardiac and Pulmonary Rehab  Referring Provider Cyndie Chime MD ([facility])  [facility] Pulmonologist Dr. Lanney Gins       Encounter Date: 11/07/2021  Check In:  Session Check In - 11/07/21 1101       Check-In   Supervising physician immediately available to respond to emergencies See telemetry face sheet for immediately available ER MD    Location [facility]-Cardiac & Pulmonary Rehab    Staff Present [name], MA, RCEP, CCRP, CCET;Joseph [place], Cristopher Estimable, RN BSN    Virtual Visit No    Medication changes reported     No    Fall or balance concerns reported    No    Warm-up and Cool-down Performed on first and last piece of equipment    Resistance Training Performed Yes    VAD Patient? No    PAD/SET Patient? No      Pain Assessment   Currently in Pain? No/denies                Social History   Tobacco Use  Smoking Status Former   Packs/day: 1.00   Years: 48.00   Total pack years: 48.00   Types: Cigarettes   Quit date: 11/07/2015   Years since quitting: 6.0  Smokeless Tobacco Never    Goals Met:  Independence with exercise equipment Exercise tolerated well No report of concerns or symptoms today Strength training completed today  Goals Unmet:  Not Applicable  Comments: Pt able to follow exercise prescription today without complaint.  Will continue to monitor for progression.    Dr. Emily Filbert is Medical Director for [place].  Dr. Ottie Glazier is Medical Director for [facility] Pulmonary Rehabilitation.

## 2021-11-10 ENCOUNTER — Telehealth: Payer: Self-pay | Admitting: Internal Medicine

## 2021-11-10 ENCOUNTER — Encounter: Payer: No Typology Code available for payment source | Admitting: *Deleted

## 2021-11-10 ENCOUNTER — Telehealth: Payer: Self-pay | Admitting: Cardiovascular Disease

## 2021-11-10 DIAGNOSIS — J849 Interstitial pulmonary disease, unspecified: Secondary | ICD-10-CM

## 2021-11-10 NOTE — Progress Notes (Signed)
Daily Session Note  Patient Details  Name: MAXDEN NAJI MRN: [id number] Date of Birth: 1951/01/29 Referring Provider:   Flowsheet Row Pulmonary Rehab from 09/29/2021 in [facility] Cardiac and Pulmonary Rehab  Referring Provider Cyndie Chime MD ([facility])  [facility] Pulmonologist Dr. Lanney Gins       Encounter Date: 11/10/2021  Check In:  Session Check In - 11/10/21 1123       Check-In   Supervising physician immediately available to respond to emergencies See telemetry face sheet for immediately available ER MD    Location [facility]-Cardiac & Pulmonary Rehab    Staff Present Renita Papa, RN Moises Blood, BS, ACSM CEP, Exercise Physiologist;Joseph Tessie Fass, [place]    Virtual Visit No    Medication changes reported     No    Fall or balance concerns reported    No    Warm-up and Cool-down Performed on first and last piece of equipment    Resistance Training Performed Yes    VAD Patient? No    PAD/SET Patient? No      Pain Assessment   Currently in Pain? No/denies                Social History   Tobacco Use  Smoking Status Former   Packs/day: 1.00   Years: 48.00   Total pack years: 48.00   Types: Cigarettes   Quit date: 11/07/2015   Years since quitting: 6.0  Smokeless Tobacco Never    Goals Met:  Independence with exercise equipment Exercise tolerated well No report of concerns or symptoms today Strength training completed today  Goals Unmet:  Not Applicable  Comments: Pt able to follow exercise prescription today without complaint.  Will continue to monitor for progression.    Dr. Emily Filbert is Medical Director for [place].  Dr. Ottie Glazier is Medical Director for [facility] Pulmonary Rehabilitation.

## 2021-11-10 NOTE — Telephone Encounter (Signed)
Will send requested medical records/last OV notes on this pt, to medical records dept to further assist.  This is for HIPPA compliance reasons. Medical records to fax notes to the contact information provided in this message.

## 2021-11-10 NOTE — Telephone Encounter (Signed)
   [facility] calling, requesting to get a copy of pt's office visit notes fax to them. Fax# [phone]

## 2021-11-10 NOTE — Telephone Encounter (Signed)
Calling to get patient notes from his visit on 8/16. Would like for notes to be faxed [phone]. Please advise

## 2021-11-10 NOTE — Telephone Encounter (Signed)
Called and spoke with both pt and spouse letting them know that MR had specified to do the nebulizer solutions as needed and they both verbalized understanding. Stated to them if pt is having to do them often and if they want to see if MR would be okay with pt getting back on a scheduled to call the office and we could further discuss this with MR. Both verbalized understanding. Nothing further needed.

## 2021-11-12 ENCOUNTER — Ambulatory Visit (INDEPENDENT_AMBULATORY_CARE_PROVIDER_SITE_OTHER): Payer: No Typology Code available for payment source

## 2021-11-12 ENCOUNTER — Encounter: Payer: No Typology Code available for payment source | Admitting: *Deleted

## 2021-11-12 DIAGNOSIS — J849 Interstitial pulmonary disease, unspecified: Secondary | ICD-10-CM

## 2021-11-12 DIAGNOSIS — I25118 Atherosclerotic heart disease of native coronary artery with other forms of angina pectoris: Secondary | ICD-10-CM | POA: Diagnosis not present

## 2021-11-12 LAB — ECHOCARDIOGRAM COMPLETE
AR max vel: 2.63 cm2
AV Area VTI: 2.84 cm2
AV Area mean vel: 2.53 cm2
AV Mean grad: 2 mmHg
AV Peak grad: 4.2 mmHg
Ao pk vel: 1.03 m/s
Area-P 1/2: 3.07 cm2
Calc EF: 51.4 %
S' Lateral: 3.2 cm
Single Plane A2C EF: 52.8 %
Single Plane A4C EF: 52 %

## 2021-11-12 NOTE — Progress Notes (Signed)
Daily Session Note  Patient Details  Name: Brian Casey MRN: [id number] Date of Birth: Jan 25, 1951 Referring Provider:   April [facility]on [facility] from 09/29/2021 in [facility] Cardiac [facility] Pul[facility] Rehab  Referring Provider Cyndie Chime MD ([place])  [facility] Pulmonologist Dr. Lanney Gins       Encounter Date: 11/12/2021  Check In:  Session Check In - 11/12/21 1109       Check-In   Supervising physician immediately available to respond to emergencies See telemetry face sheet for immediately available ER MD    Location [facility]-Cardiac & Pulmonary Rehab    Staff Present Hope Budds, RDN, LDN;Joseph Tessie Fass, RCP,RRT,BSRT;Tatsuya Okray Sherryll Burger, RN BSN    Virtual Visit No    Medication changes reported     No    Warm-up and Cool-down Performed on first and last piece of equipment    Resistance Training Performed Yes    VAD Patient? No    PAD/SET Patient? No      Pain Assessment   Currently in Pain? No/denies                Social History   Tobacco Use  Smoking Status Former   Packs/day: 1.00   Years: 48.00   Total pack years: 48.00   Types: Cigarettes   Quit date: 11/07/2015   Years since quitting: 6.0  Smokeless Tobacco Never    Goals Met:  Independence with exercise equipment Exercise tolerated well No report of concerns or symptoms today Strength training completed today  Goals Unmet:  Not Applicable  Comments: Pt able to follow exercise prescription today without complaint.  Will continue to monitor for progression.    Dr. Emily Filbert is Medical Director for [place].  Dr. Ottie Glazier is Medical Director for [facility] Pulmonary Rehabilitation.

## 2021-11-13 ENCOUNTER — Ambulatory Visit
Admission: RE | Admit: 2021-11-13 | Discharge: 2021-11-13 | Disposition: A | Discharge status: Home / Self Care | Payer: No Typology Code available for payment source | Source: Ambulatory Visit | Attending: Internal Medicine | Admitting: Internal Medicine

## 2021-11-13 DIAGNOSIS — J849 Interstitial pulmonary disease, unspecified: Secondary | ICD-10-CM | POA: Diagnosis present

## 2021-11-14 ENCOUNTER — Telehealth: Payer: Self-pay | Admitting: Internal Medicine

## 2021-11-14 ENCOUNTER — Encounter: Payer: No Typology Code available for payment source | Admitting: *Deleted

## 2021-11-14 DIAGNOSIS — J849 Interstitial pulmonary disease, unspecified: Secondary | ICD-10-CM

## 2021-11-14 NOTE — Progress Notes (Signed)
Daily Session Note  Patient Details  Name: Brian Casey MRN: [id number] Date of Birth: Dec 19, 1950 Referring Provider:   April Manson Pulmonary Rehab from 09/29/2021 in [facility] Cardiac and Pulmonary Rehab  Referring Provider Cyndie Chime MD ([place])  [facility] Pulmonologist Dr. Lanney Gins       Encounter Date: 11/14/2021  Check In:  Session Check In - 11/14/21 1122       Check-In   Supervising physician immediately available to respond to emergencies See telemetry face sheet for immediately available ER MD    Location [facility]-Cardiac & Pulmonary Rehab    Staff Present Renita Papa, RN BSN;Joseph [place], RCP,RRT,BSRT;Jessica Grimes, [place], RCEP, CCRP, CCET    Virtual Visit No    Medication changes reported     No    Fall or balance concerns reported    No    Warm-up and Cool-down Performed on first and last piece of equipment    Resistance Training Performed Yes    VAD Patient? No    PAD/SET Patient? No      Pain Assessment   Currently in Pain? No/denies                Social History   Tobacco Use  Smoking Status Former   Packs/day: 1.00   Years: 48.00   Total pack years: 48.00   Types: Cigarettes   Quit date: 11/07/2015   Years since quitting: 6.0  Smokeless Tobacco Never    Goals Met:  Independence with exercise equipment Exercise tolerated well No report of concerns or symptoms today Strength training completed today  Goals Unmet:  Not Applicable  Comments: Pt able to follow exercise prescription today without complaint.  Will continue to monitor for progression.    Dr. Emily Filbert is Medical Director for [place].  Dr. Ottie Glazier is Medical Director for [facility] Pulmonary Rehabilitation.

## 2021-11-14 NOTE — Telephone Encounter (Signed)
Plan from Clint 8/15: -Hold off on colonoscopy till echocardiogram and overnight oxygen assessments are completed             -Most likely can have colonoscopy but will have to be an inpatient setting     Follow-up - Return to see Dr. Chase Caller 30-minute visit in less than 2-3 weeks (video visit)    Pt had echo performed 8/23. MR, please advise if pt will be okay to have colonoscopy 8/30 or if this should be rescheduled.

## 2021-11-18 ENCOUNTER — Encounter: Payer: Self-pay | Admitting: Internal Medicine

## 2021-11-18 NOTE — Telephone Encounter (Signed)
Echocardiogram is normal.  Therefore patient can have endoscopy and colonoscopy but it has to be done in the hospital under general anesthesia.  Cannot be done as an outpatient

## 2021-11-19 ENCOUNTER — Encounter: Payer: Self-pay | Admitting: Internal Medicine

## 2021-11-19 ENCOUNTER — Encounter
Admission: RE | Disposition: A | Discharge status: Home / Self Care | Payer: Self-pay | Source: Home / Self Care | Attending: Internal Medicine

## 2021-11-19 ENCOUNTER — Ambulatory Visit: Payer: No Typology Code available for payment source | Admitting: Anesthesiology

## 2021-11-19 ENCOUNTER — Ambulatory Visit
Admission: RE | Admit: 2021-11-19 | Discharge: 2021-11-19 | Disposition: A | Discharge status: Home / Self Care | Payer: No Typology Code available for payment source | Attending: Internal Medicine | Admitting: Internal Medicine

## 2021-11-19 DIAGNOSIS — K6389 Other specified diseases of intestine: Secondary | ICD-10-CM | POA: Insufficient documentation

## 2021-11-19 DIAGNOSIS — F418 Other specified anxiety disorders: Secondary | ICD-10-CM | POA: Insufficient documentation

## 2021-11-19 DIAGNOSIS — Z1211 Encounter for screening for malignant neoplasm of colon: Secondary | ICD-10-CM | POA: Insufficient documentation

## 2021-11-19 DIAGNOSIS — Z87891 Personal history of nicotine dependence: Secondary | ICD-10-CM | POA: Insufficient documentation

## 2021-11-19 DIAGNOSIS — K5730 Diverticulosis of large intestine without perforation or abscess without bleeding: Secondary | ICD-10-CM | POA: Diagnosis not present

## 2021-11-19 DIAGNOSIS — K219 Gastro-esophageal reflux disease without esophagitis: Secondary | ICD-10-CM | POA: Insufficient documentation

## 2021-11-19 DIAGNOSIS — K2289 Other specified disease of esophagus: Secondary | ICD-10-CM | POA: Diagnosis not present

## 2021-11-19 DIAGNOSIS — Z800 Family history of malignant neoplasm of digestive organs: Secondary | ICD-10-CM | POA: Diagnosis not present

## 2021-11-19 DIAGNOSIS — K640 First degree hemorrhoids: Secondary | ICD-10-CM | POA: Diagnosis not present

## 2021-11-19 DIAGNOSIS — I10 Essential (primary) hypertension: Secondary | ICD-10-CM | POA: Diagnosis not present

## 2021-11-19 DIAGNOSIS — K2970 Gastritis, unspecified, without bleeding: Secondary | ICD-10-CM | POA: Insufficient documentation

## 2021-11-19 DIAGNOSIS — E119 Type 2 diabetes mellitus without complications: Secondary | ICD-10-CM | POA: Insufficient documentation

## 2021-11-19 DIAGNOSIS — E039 Hypothyroidism, unspecified: Secondary | ICD-10-CM | POA: Diagnosis not present

## 2021-11-19 DIAGNOSIS — D124 Benign neoplasm of descending colon: Secondary | ICD-10-CM | POA: Diagnosis not present

## 2021-11-19 DIAGNOSIS — I252 Old myocardial infarction: Secondary | ICD-10-CM | POA: Diagnosis not present

## 2021-11-19 DIAGNOSIS — G4730 Sleep apnea, unspecified: Secondary | ICD-10-CM | POA: Diagnosis not present

## 2021-11-19 DIAGNOSIS — I4891 Unspecified atrial fibrillation: Secondary | ICD-10-CM | POA: Insufficient documentation

## 2021-11-19 DIAGNOSIS — Z951 Presence of aortocoronary bypass graft: Secondary | ICD-10-CM | POA: Diagnosis not present

## 2021-11-19 DIAGNOSIS — I2510 Atherosclerotic heart disease of native coronary artery without angina pectoris: Secondary | ICD-10-CM | POA: Insufficient documentation

## 2021-11-19 HISTORY — DX: Interstitial pulmonary disease, unspecified: J84.9

## 2021-11-19 HISTORY — PX: ESOPHAGOGASTRODUODENOSCOPY: SHX5428

## 2021-11-19 HISTORY — PX: COLONOSCOPY: SHX5424

## 2021-11-19 LAB — GLUCOSE, CAPILLARY: Glucose-Capillary: 157 mg/dL — ABNORMAL HIGH (ref 70–99)

## 2021-11-19 SURGERY — COLONOSCOPY
Anesthesia: General

## 2021-11-19 MED ORDER — GLYCOPYRROLATE 0.2 MG/ML IJ SOLN
INTRAMUSCULAR | Status: DC | PRN
  Administered 2021-11-19: .2 mg via INTRAVENOUS

## 2021-11-19 MED ORDER — PROPOFOL 10 MG/ML IV BOLUS
INTRAVENOUS | Status: DC | PRN
  Administered 2021-11-19: 10 mg via INTRAVENOUS
  Administered 2021-11-19: 70 mg via INTRAVENOUS
  Administered 2021-11-19: 20 mg via INTRAVENOUS

## 2021-11-19 MED ORDER — LIDOCAINE HCL (CARDIAC) PF 100 MG/5ML IV SOSY
PREFILLED_SYRINGE | INTRAVENOUS | Status: DC | PRN
  Administered 2021-11-19: 100 mg via INTRAVENOUS

## 2021-11-19 MED ORDER — SODIUM CHLORIDE 0.9 % IV SOLN: INTRAVENOUS | Status: DC

## 2021-11-19 MED ORDER — PROPOFOL 500 MG/50ML IV EMUL
INTRAVENOUS | Status: DC | PRN
  Administered 2021-11-19: 145 ug/kg/min via INTRAVENOUS

## 2021-11-19 MED ORDER — PHENYLEPHRINE 80 MCG/ML (10ML) SYRINGE FOR IV PUSH (FOR BLOOD PRESSURE SUPPORT)
PREFILLED_SYRINGE | INTRAVENOUS | Status: DC | PRN
  Administered 2021-11-19: 80 ug via INTRAVENOUS

## 2021-11-19 MED ORDER — DEXMEDETOMIDINE HCL IN NACL 200 MCG/50ML IV SOLN
INTRAVENOUS | Status: DC | PRN
  Administered 2021-11-19: 8 ug via INTRAVENOUS

## 2021-11-19 NOTE — Transfer of Care (Signed)
Immediate Anesthesia Transfer of Care Note  Patient: Brian Casey  Procedure(s) Performed: COLONOSCOPY ESOPHAGOGASTRODUODENOSCOPY (EGD)  Patient Location: Endoscopy Unit  Anesthesia Type:General  Level of Consciousness: awake, drowsy and patient cooperative  Airway & Oxygen Therapy: Patient Spontanous Breathing and Patient connected to face mask oxygen  Post-op Assessment: Report given to RN and Post -op Vital signs reviewed and stable  Post vital signs: Reviewed and stable  Last Vitals:  Vitals Value Taken Time  BP 124/59 11/19/21 0934  Temp 35.9 C 11/19/21 0933  Pulse 73 11/19/21 0935  Resp 19 11/19/21 0935  SpO2 100 % 11/19/21 0935  Vitals shown include unvalidated device data.  Last Pain:  Vitals:   11/19/21 0933  TempSrc: Tympanic  PainSc: Asleep         Complications: No notable events documented.

## 2021-11-19 NOTE — Telephone Encounter (Signed)
Called and spoke with [place].  Jenny Reichmann stated patient was currently having colonoscopy at this time at [facility].  Nothing further at this time.

## 2021-11-19 NOTE — Op Note (Signed)
[facility] Gastroenterology Patient Name: Brian Casey Procedure Date: 11/19/2021 9:02 AM MRN: [id number] Account #: [id number] Date of Birth: 09-13-1950 Admit Type: Outpatient Age: 71 Room: [facility] ENDO ROOM 2 Gender: Male Note Status: Finalized Instrument Name: Jasper Riling [id number] Procedure:             Colonoscopy Indications:           Screening in patient at increased risk: Family history                         of 1st-degree relative with colorectal cancer Providers:             Benay Pike. Alice Reichert MD, MD Referring MD:          Irven Easterly. Kary Kos, MD (Referring MD) Medicines:             Propofol per Anesthesia Complications:         No immediate complications. Procedure:             Pre-Anesthesia Assessment:                        - The risks and benefits of the procedure and the                         sedation options and risks were discussed with the                         patient. All questions were answered and informed                         consent was obtained.                        - Patient identification and proposed procedure were                         verified prior to the procedure by the nurse. The                         procedure was verified in the procedure room.                        - ASA Grade Assessment: III - A patient with severe                         systemic disease.                        - After reviewing the risks and benefits, the patient                         was deemed in satisfactory condition to undergo the                         procedure.                        After obtaining informed consent, the colonoscope was  passed under direct vision. Throughout the procedure,                         the patient's blood pressure, pulse, and oxygen                         saturations were monitored continuously. The                         Colonoscope was introduced through the anus and                          advanced to the the cecum, identified by appendiceal                         orifice and ileocecal valve. The colonoscopy was                         performed without difficulty. The patient tolerated                         the procedure well. The quality of the bowel                         preparation was adequate. The ileocecal valve,                         appendiceal orifice, and rectum were photographed. Findings:      The perianal and digital rectal examinations were normal. Pertinent       negatives include normal sphincter tone and no palpable rectal lesions.      A 5 mm polyp was found in the descending colon. The polyp was sessile.       The polyp was removed with a jumbo cold forceps. Resection and retrieval       were complete.      Many small and large-mouthed diverticula were found in the sigmoid       colon. There was no evidence of diverticular bleeding.      A scattered area of moderately erythematous mucosa was found in the       sigmoid colon. Mucosa was biopsied with a cold forceps for histology.       One specimen bottle was sent to pathology.      Non-bleeding internal hemorrhoids were found during retroflexion. The       hemorrhoids were Grade I (internal hemorrhoids that do not prolapse).      The exam was otherwise without abnormality. Impression:            - One 5 mm polyp in the descending colon, removed with                         a jumbo cold forceps. Resected and retrieved.                        - Mild diverticulosis in the sigmoid colon. There was                         no evidence of diverticular bleeding.                        -  Erythematous mucosa in the sigmoid colon. Biopsied.                        - The examination was otherwise normal. Recommendation:        - Await pathology results from EGD, also performed                         today.                        - Patient has a contact number available for                          emergencies. The signs and symptoms of potential                         delayed complications were discussed with the patient.                         Return to normal activities tomorrow. Written                         discharge instructions were provided to the patient.                        - Resume previous diet.                        - Continue present medications.                        - Await pathology results.                        - Repeat colonoscopy after studies are complete for                         surveillance.                        - Return to GI office PRN.                        - The findings and recommendations were discussed with                         the patient. Procedure Code(s):     --- Professional ---                        [id number], Colonoscopy, flexible; with biopsy, single or                         multiple Diagnosis Code(s):     --- Professional ---                        K57.30, Diverticulosis of large intestine without                         perforation or abscess without bleeding  K63.89, Other specified diseases of intestine                        K63.5, Polyp of colon                        Z80.0, Family history of malignant neoplasm of                         digestive organs CPT copyright 2019 American Medical Association. All rights reserved. The codes documented in this report are preliminary and upon coder review may  be revised to meet current compliance requirements. Efrain Sella MD, MD 11/19/2021 9:57:31 AM This report has been signed electronically. Number of Addenda: 0 Note Initiated On: 11/19/2021 9:02 AM Scope Withdrawal Time: 0 hours 6 minutes 29 seconds  Total Procedure Duration: 0 hours 10 minutes 23 seconds  Estimated Blood Loss:  Estimated blood loss: none. Estimated blood loss: none.      [facility]

## 2021-11-19 NOTE — H&P (Signed)
Outpatient short stay form Pre-procedure 11/19/2021 8:21 AM Brian Casey, M.D.  Primary Physician: [name], M.D.  Reason for visit:  Colon cancer screening, GERD  History of present illness:  71 y/o male with long history of uncomplicated GERD also presents for reasons for colon cancer screening, average risk. Denies dysphagia, involuntary weight loss, severe abdominal pain.    Current Facility-Administered Medications:    0.9 %  sodium chloride infusion, , Intravenous, Continuous, Norman Piacentini, Benay Pike, MD  Medications Prior to Admission  Medication Sig Dispense Refill Last Dose   albuterol (VENTOLIN HFA) 108 (90 Base) MCG/ACT inhaler    Past Month   ALPRAZolam (XANAX) 0.25 MG tablet Take by mouth.   Past Week   aspirin EC 81 MG tablet Take 81 mg by mouth daily. Swallow whole.   11/18/2021   atorvastatin (LIPITOR) 40 MG tablet Take 40 mg by mouth daily.   11/18/2021   carvedilol (COREG) 12.5 MG tablet Take 12.5 mg by mouth 2 (two) times daily with a meal.   11/18/2021   furosemide (LASIX) 20 MG tablet Take 20 mg by mouth daily.   11/18/2021   ipratropium-albuterol (DUONEB) 0.5-2.5 (3) MG/3ML SOLN Take by nebulization.   Past Month   levothyroxine (SYNTHROID) 100 MCG tablet Take 100 mcg by mouth daily before breakfast.   11/18/2021   omeprazole (PRILOSEC) 20 MG capsule Take 1 capsule (20 mg total) by mouth daily. OFFICE VISIT REQUIRED FOR ADDITIONAL REFILLS 30 capsule 0 11/18/2021   predniSONE (DELTASONE) 20 MG tablet    11/18/2021   budesonide (PULMICORT) 0.25 MG/2ML nebulizer solution INHALE CONTENTS OF ONE VIAL/NEBULE VIA NEBULIZER TWO TIMES A DAY (Patient not taking: Reported on 11/05/2021)      levothyroxine (SYNTHROID, LEVOTHROID) 100 MCG tablet Take 1 tablet (100 mcg total) by mouth daily before breakfast. OFFICE VISIT WITH  LABS REQUIRED FOR ADDITIONAL REFILLS (Patient not taking: Reported on 11/05/2021) 30 tablet 0    tadalafil (CIALIS) 5 MG tablet Take 1 tablet by mouth as  needed.        Allergies  Allergen Reactions   Penicillins Nausea And Vomiting and Other (See Comments)    REACTION: Vomiting Has patient had a PCN reaction causing immediate rash, facial/tongue/throat swelling, SOB or lightheadedness with hypotension: Yes Has patient had a PCN reaction causing severe rash involving mucus membranes or skin necrosis: No Has patient had a PCN reaction that required hospitalization No Has patient had a PCN reaction occurring within the last 10 years: No If all of the above answers are "NO", then may proceed with Cephalosporin use.   Amlodipine Swelling   Bupropion    Bupropion Hcl     REACTION: Severe constipation, insomnia   Chlorthalidone Other (See Comments)    Other reaction(s): Hyponatremia   Metformin Diarrhea   Terazosin Other (See Comments)     Past Medical History:  Diagnosis Date   Acute renal failure ([place]) 11/06/2015   [name] 11/06/2015   Adenomatous colon polyp    Anxiety    Arthritis    "lower spine; fingers" (11/07/2015)   Depression    Diverticulosis    GERD (gastroesophageal reflux disease)    High cholesterol    History of bronchitis    Hypertension 10/17/2013   Hyperthyroidism    "had it radiated in his '72s"   Hypothyroidism    ILD (interstitial lung disease) ([place])    New onset atrial fibrillation ([place]) 11/06/2015   [name] 11/07/2015   NSTEMI (non-ST elevated myocardial infarction) ([place]) 11/07/2015  Thyroid disease    Type II diabetes mellitus ([place])    type II   Wears glasses     Review of systems:  Otherwise negative.    Physical Exam  Gen: Alert, oriented. Appears stated age.  HEENT: [place]/AT. PERRLA. Lungs: CTA, no wheezes. CV: RR nl S1, S2. Abd: soft, benign, no masses. BS+ Ext: No edema. Pulses 2+    Planned procedures: Proceed with EGD and colonoscopy. The patient understands the nature of the planned procedure, indications, risks, alternatives and potential complications including but not limited to  bleeding, infection, perforation, damage to internal organs and possible oversedation/side effects from anesthesia. The patient agrees and gives consent to proceed.  Please refer to procedure notes for findings, recommendations and patient disposition/instructions.     [place]image K. Alice Casey, M.D. Gastroenterology 11/19/2021  8:21 AM

## 2021-11-19 NOTE — Anesthesia Procedure Notes (Signed)
Procedure Name: General with mask airway Date/Time: 11/19/2021 9:14 AM  Performed by: Kelton Pillar, CRNAPre-anesthesia Checklist: Patient identified, Emergency Drugs available, Suction available and Patient being monitored Patient Re-evaluated:Patient Re-evaluated prior to induction Oxygen Delivery Method: Simple face mask Induction Type: IV induction Placement Confirmation: positive ETCO2, CO2 detector and breath sounds checked- equal and bilateral Dental Injury: Teeth and Oropharynx as per pre-operative assessment

## 2021-11-19 NOTE — Anesthesia Preprocedure Evaluation (Addendum)
Anesthesia Evaluation  Patient identified by MRN, date of birth, ID band Patient awake    Reviewed: Allergy & Precautions, NPO status , Patient's Chart, lab work & pertinent test results  Airway Mallampati: III  TM Distance: >3 FB Neck ROM: full    Dental no notable dental hx.    Pulmonary shortness of breath and Long-Term Oxygen Therapy, sleep apnea (high risk) , former smoker,  Idiopathic pulmonary fibrosis diagnosis based upon HRCT and Biopsy paterns. Pulm visit 10/2021. Pt has progressive SOB  4L O2 with exertion  2L o2         Cardiovascular hypertension, + CAD, + Past MI and + CABG  Normal cardiovascular exam+ dysrhythmias Atrial Fibrillation   ECHO : 1. Left ventricular ejection fraction, by estimation, is 60 to 65%. The  left ventricle has normal function. The left ventricle has no regional  wall motion abnormalities. There is mild left ventricular hypertrophy.  Left ventricular diastolic parameters  are consistent with Grade I diastolic dysfunction (impaired relaxation).  2. Right ventricular systolic function is normal. The right ventricular  size is normal.  3. The mitral valve is normal in structure. No evidence of mitral valve  regurgitation.  4. The aortic valve is tricuspid. Aortic valve regurgitation is not  visualized. Aortic valve sclerosis is present, with no evidence of aortic  valve stenosis.  5. Aortic dilatation noted. There is mild dilatation of the aortic root,  measuring 39 mm.  6. The inferior vena cava is normal in size with greater than 50%  respiratory variability, suggesting right atrial pressure of 3 mmHg.   Neuro/Psych PSYCHIATRIC DISORDERS Anxiety negative neurological ROS     GI/Hepatic Neg liver ROS, GERD  ,  Endo/Other  diabetes, Type 2Hypothyroidism   Renal/GU Renal InsufficiencyRenal disease  negative genitourinary   Musculoskeletal   Abdominal   Peds   Hematology negative hematology ROS (+)   Anesthesia Other Findings Past Medical History: 11/06/2015: Acute renal failure ([place])     Comment:  [name] 11/06/2015 No date: Adenomatous colon polyp No date: Anxiety No date: Arthritis     Comment:  "lower spine; fingers" (11/07/2015) No date: Depression No date: Diverticulosis No date: GERD (gastroesophageal reflux disease) No date: High cholesterol No date: History of bronchitis 10/17/2013: Hypertension No date: Hyperthyroidism     Comment:  "had it radiated in his '18s" No date: Hypothyroidism 11/06/2015: New onset atrial fibrillation ([place])     Comment:  [name] 11/07/2015 11/07/2015: NSTEMI (non-ST elevated myocardial infarction) ([place]) No date: Thyroid disease No date: Type II diabetes mellitus ([place])     Comment:  type II No date: Wears glasses  Past Surgical History: 11/06/2015: CARDIAC CATHETERIZATION; Right     Comment:  Procedure: Left Heart Cath and Coronary Angiography;                Surgeon: Minna Merritts, MD;  Location: [place]               CV LAB;  Service: Cardiovascular;  Laterality: Right; 01/17/2016: CHOLECYSTECTOMY; N/A     Comment:  Procedure: LAPAROSCOPIC CHOLECYSTECTOMY WITH               INTRAOPERATIVE CHOLANGIOGRAM;  Surgeon: Fanny Skates,               MD;  Location: [place];  Service: General;  Laterality:               N/A; No date: COLONOSCOPY 11/14/2015: CORONARY ARTERY BYPASS GRAFT; N/A  Comment:  Procedure: CORONARY ARTERY BYPASS GRAFTING (CABG) x 4;                Surgeon: Ivin Poot, MD;  Location: [place];  Service:              Open Heart Surgery;  Laterality: N/A; 11/14/2015: ENDARTERECTOMY; Left     Comment:  Procedure: LEFT ENDARTERECTOMY CAROTID;  Surgeon:               Waynetta Sandy, MD;  Location: [place];  Service:              Vascular;  Laterality: Left; X 3: FINGER GANGLION CYST EXCISION; Left     Comment:  "index finger X 2; thumb X 1" 1982: HEMORRHOID SURGERY No  date: HERNIA REPAIR 11/16/2013: INSERTION OF MESH; N/A     Comment:  Procedure: INSERTION OF MESH;  Surgeon: Imogene Burn.               Georgette Dover, MD;  Location: [place];  Service: General;                Laterality: N/A; 11/08/2015: IR GENERIC HISTORICAL     Comment:  IR PERC CHOLECYSTOSTOMY 11/08/2015 Corrie Mckusick, DO               [facility] RAD 12/13/2015: IR GENERIC HISTORICAL     Comment:  IR CHOLANGIOGRAM EXISTING TUBE 12/13/2015 [facility]-INTERV RAD 12/25/2015: IR GENERIC HISTORICAL     Comment:  IR CATHETER TUBE CHANGE 12/25/2015 Sandi Mariscal, MD               [facility] 12/12/2015: IR GENERIC HISTORICAL     Comment:  IR RADIOLOGIST EVAL & MGMT 12/12/2015 Greggory Keen, MD               [facility] RAD 11/14/2015: TEE WITHOUT CARDIOVERSION; N/A     Comment:  Procedure: TRANSESOPHAGEAL ECHOCARDIOGRAM (TEE);                Surgeon: Ivin Poot, MD;  Location: [place];  Service:              Open Heart Surgery;  Laterality: N/A; 9/50/9326: UMBILICAL HERNIA REPAIR; N/A     Comment:  Procedure: UMBILICAL HERNIA REPAIR;  Surgeon: Imogene Burn.              Georgette Dover, MD;  Location: [place];  Service: General;                Laterality: N/A; 11/14/2015: VEIN HARVEST; Right     Comment:  Procedure: RIGHT LEG GREATER SAPHENOUS VEIN HARVEST;                Surgeon: Ivin Poot, MD;  Location: [place];  Service:              Open Heart Surgery;  Laterality: Right;     Reproductive/Obstetrics negative OB ROS                           Anesthesia Physical Anesthesia Plan  ASA: 3  Anesthesia Plan: General   Post-op Pain Management: Minimal or no pain anticipated   Induction: Intravenous  PONV Risk Score and Plan: Propofol infusion and TIVA  Airway Management Planned: Natural Airway  Additional Equipment:   Intra-op Plan:   Post-operative Plan:   Informed Consent: I have reviewed the patients History and Physical, chart, labs and discussed  the procedure including the risks,  benefits and alternatives for the proposed anesthesia with the patient or authorized representative who has indicated his/her understanding and acceptance.     Dental Advisory Given  Plan Discussed with: Anesthesiologist, CRNA and Surgeon  Anesthesia Plan Comments:        Anesthesia Quick Evaluation

## 2021-11-19 NOTE — Anesthesia Postprocedure Evaluation (Signed)
Anesthesia Post Note  Patient: Brian Casey  Procedure(s) Performed: COLONOSCOPY ESOPHAGOGASTRODUODENOSCOPY (EGD)  Patient location during evaluation: Endoscopy Anesthesia Type: General Level of consciousness: awake and alert Pain management: pain level controlled Vital Signs Assessment: post-procedure vital signs reviewed and stable Respiratory status: spontaneous breathing, nonlabored ventilation and respiratory function stable Cardiovascular status: blood pressure returned to baseline and stable Postop Assessment: no apparent nausea or vomiting Anesthetic complications: no   No notable events documented.   Last Vitals:  Vitals:   11/19/21 0943 11/19/21 0953  BP: (!) 104/58 135/70  Pulse: 72 73  Resp: 17 20  Temp:    SpO2: 100% 99%    Last Pain:  Vitals:   11/19/21 0953  TempSrc:   PainSc: 0-No pain                 [place] Ouch

## 2021-11-19 NOTE — Interval H&P Note (Signed)
History and Physical Interval Note:  11/19/2021 8:22 AM  Brian Casey  has presented today for surgery, with the diagnosis of Family history of colon cancer (Z80.0) Colon cancer screening (Z12.11) Gastroesophageal reflux disease, unspecified whether esophagitis present K21.9.  The various methods of treatment have been discussed with the patient and family. After consideration of risks, benefits and other options for treatment, the patient has consented to  Procedure(s): COLONOSCOPY (N/A) ESOPHAGOGASTRODUODENOSCOPY (EGD) (N/A) as a surgical intervention.  The patient's history has been reviewed, patient examined, no change in status, stable for surgery.  I have reviewed the patient's chart and labs.  Questions were answered to the patient's satisfaction.     [place], [place]

## 2021-11-19 NOTE — Op Note (Signed)
[facility] Gastroenterology Patient Name: Brian Casey Procedure Date: 11/19/2021 9:02 AM MRN: [id number] Account #: [id number] Date of Birth: [birth year] Admit Type: Outpatient Age: 71 Room: [facility] ENDO ROOM 2 Gender: Male Note Status: Finalized Instrument Name: Upper Endoscope [id number] Procedure:             Upper GI endoscopy Indications:           Gastro-esophageal reflux disease Providers:             Benay Pike. Alice Reichert MD, MD Referring MD:          Irven Easterly. Kary Kos, MD (Referring MD) Medicines:             Propofol per Anesthesia Complications:         No immediate complications. Procedure:             Pre-Anesthesia Assessment:                        - The risks and benefits of the procedure and the                         sedation options and risks were discussed with the                         patient. All questions were answered and informed                         consent was obtained.                        - Patient identification and proposed procedure were                         verified prior to the procedure by the nurse. The                         procedure was verified in the procedure room.                        - ASA Grade Assessment: III - A patient with severe                         systemic disease.                        - After reviewing the risks and benefits, the patient                         was deemed in satisfactory condition to undergo the                         procedure.                        After obtaining informed consent, the endoscope was                         passed under direct vision. Throughout the procedure,  the patient's blood pressure, pulse, and oxygen                         saturations were monitored continuously. The Endoscope                         was introduced through the mouth, and advanced to the                         third part of duodenum. The upper GI endoscopy was                          accomplished without difficulty. The patient tolerated                         the procedure well. Findings:      The Z-line was irregular and was found at the gastroesophageal junction.       Mucosa was biopsied with a cold forceps for histology. One specimen       bottle was sent to pathology.      Diffuse mild inflammation characterized by congestion (edema) and       erosions was found in the entire examined stomach.      The examined duodenum was normal.      The exam was otherwise without abnormality. Impression:            - Z-line irregular, at the gastroesophageal junction.                         Biopsied.                        - Gastritis.                        - Normal examined duodenum.                        - The examination was otherwise normal. Recommendation:        - Await pathology results.                        - Proceed with colonoscopy Procedure Code(s):     --- Professional ---                        [phone], Esophagogastroduodenoscopy, flexible,                         transoral; with biopsy, single or multiple Diagnosis Code(s):     --- Professional ---                        K21.9, Gastro-esophageal reflux disease without                         esophagitis                        K29.70, Gastritis, unspecified, without bleeding                        K22.8, Other specified diseases  of esophagus CPT copyright 2019 [employer]. All rights reserved. The codes documented in this report are preliminary and upon coder review may  be revised to meet current compliance requirements. Efrain Sella MD, MD 11/19/2021 9:18:20 AM This report has been signed electronically. Number of Addenda: 0 Note Initiated On: 11/19/2021 9:02 AM Estimated Blood Loss:  Estimated blood loss: none.      [facility]

## 2021-11-20 ENCOUNTER — Telehealth (INDEPENDENT_AMBULATORY_CARE_PROVIDER_SITE_OTHER): Payer: No Typology Code available for payment source | Admitting: Internal Medicine

## 2021-11-20 ENCOUNTER — Encounter: Payer: Self-pay | Admitting: Internal Medicine

## 2021-11-20 ENCOUNTER — Telehealth: Payer: Self-pay | Admitting: Internal Medicine

## 2021-11-20 DIAGNOSIS — Z7952 Long term (current) use of systemic steroids: Secondary | ICD-10-CM

## 2021-11-20 DIAGNOSIS — J849 Interstitial pulmonary disease, unspecified: Secondary | ICD-10-CM | POA: Diagnosis not present

## 2021-11-20 DIAGNOSIS — R0609 Other forms of dyspnea: Secondary | ICD-10-CM

## 2021-11-20 DIAGNOSIS — J9611 Chronic respiratory failure with hypoxia: Secondary | ICD-10-CM

## 2021-11-20 DIAGNOSIS — J679 Hypersensitivity pneumonitis due to unspecified organic dust: Secondary | ICD-10-CM

## 2021-11-20 NOTE — Telephone Encounter (Signed)
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   [employer] are also desaturating with exertion and requiring 4 L of nasal oxygen to correct suggesting quite advanced disease   Glad you are better with reduced prednisome   Plan - cancel referral for lung biopsy due to altnerate strategy and risk  v benefit - -reorder ONO test on room air             - our office to see if can get it done faster through [place] 4 L of nasal cannula oxygen with exertion             - do o2 titration test at [facility] for amount of o2 need             - our office to see if you need [facility] approval -start Ofev 179m twice daily             - extensisvely counseled -Hold off restart Imuran we can do this or cellcept in future depending on course with ofev -STat on prednisone to 10 mg/day - can aim t o reduce to 568mper day in future depending on course - continue prilosec --Continue pulmonary rehabilitation for now - change nebulziers to prn - in the long run need to lose weight esp if transplant an option             - talk to PCP [facility] Pink[facility] - Wegovy/Ozempic     Attempted to call pt to discuss AVS with him but unable to reach. Left message for him to return call.

## 2021-11-20 NOTE — Patient Instructions (Addendum)
ICD-10-CM   1. ILD (interstitial lung disease) ([place])  J84.9     2. Chronic respiratory failure with hypoxia (HCC)  J96.11     3. DOE (dyspnea on exertion)  R06.09     4. Pre-operative respiratory examination  Z01.811     5. Immunosuppression due to drug therapy ([place])  D84.821    Z79.899     6. 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  [employer] are also desaturating with exertion and requiring 4 L of nasal oxygen to correct suggesting quite advanced disease  Glad you are better with reduced prednisome  Plan - cancel referral for lung biopsy due to altnerate strategy and risk  v benefit - -reorder ONO test on room air  - our office to see if can get it done faster through [place] 4 L of nasal cannula oxygen with exertion  - do o2 titration test at [facility] for amount of o2 need  - our office to see if you need [facility] approval -start Ofev 155m twice daily  - extensisvely counseled -Hold off restart Imuran we can do this or cellcept in future depending on course with ofev -STat on prednisone to 10 mg/day - can aim t o reduce to 57mper day in future depending on course - continue prilosec --Continue pulmonary rehabilitation for now - change nebulziers to prn - in the long run need to lose weight esp if transplant an option  - talk to PCP [facility] [facility] - Wegovy/Ozempic  Obesity  - gMI 32 on 207#  Plan  - goal is 170#  Pre-operative respiratory examination  -Future consideration of umbilical hernia surgery  - being held off by surgeon  Plan - monitor  Follow-up - Return to see Dr. RaChase Callerr APP  30-minute visit in less than 6  weeks  - video visiti fine to see uptake with ofev

## 2021-11-20 NOTE — Progress Notes (Signed)
OV 11/04/2021  Subjective:  Patient ID: Brian Casey, male , DOB: 09/08/1950 , age 71 y.o. , MRN: [id number] , ADDRESS: [address] [place] [address] PCP [name], MD Patient Care Team: [name], MD as PCP - General (Family Medicine) Minna Merritts, MD as Consulting Physician (Cardiology) Dahlia Byes, MD as Consulting Physician (Cardiothoracic Surgery)  This Provider for this visit: Treatment Team:  Attending Provider: Brand Males, MD    11/04/2021 -  hx chart reviwee,patient, wife, ILD questions Chief Complaint  Patient presents with   Consult    Pt has had recent imaging performed which showed diagnosis of ILD. Pt has had complaints of cough and SOB.     HPI Brian Casey 71 y.o. -  Presents with wife. Has hx of heavy asbestos ezposure. Former smoker. In Aug 2017 had acute chole, e colii bcacteremia and NSTEMI and sp CABG. CTA at that time showed some GGO and bialteral efffusion. Then in 2019 June admitted for had pleuirsy and s/p thora 1L (rad review confirms that). LHD 579. CUtlure negatiuve. WAS EOSINOPHLIC 33%. C at that the time considered as ILD by chest radiology. Also had AKI creat 1.42m%. Then started following with Dr [place]aul Delin [place] Says he was on surveillance approach. No thora ever after that. In April 2022 he had CT - the ILD for first time started getting worse but he was feeling fine. In June 2022 started seeing Dr ALanney Gins ARound this time also had Covid. In Nov 2022 had another CT cchest  ILD progressed but stil feeling ok. In Jan 2023 developed cough, ACE inhibitor got changed to ARB and sometime later got started in pred/immuran as well for his ILD. Then developed flu like symptoms. Both ARB and immuran g t stopped and he got better. Immuran restarted at lower dose at 572mper day in May 2023. CT at this time showed worsening ILD. 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 Prednisone at 2072mer day. No more flu symptoms. Feels  side effect was ARB related. Despte immune suppression regimen he feels he is declining more dysneic. Still on room air at home and started rehab few days ago. Noticed in rehab needing 4L [place] to exert  (also confirmed here this visit). In addition prednisone is causing insomina and fatigue. He feels this med regimen is not helping  Of note he has upcoing endoscopy and colonoscop with Dr TolAlice Reichert30/23. Also has a verntral hernia for which he wants surgery. Overall he and his wife are very concerned about this ILD   He is also on Inhaler Rx and not sure it is helOrthoptisttegrated Comprehensive ILD Questionnaire  Symptoms:  gradally getting worse x 6 months. Cough x 6 months only but CT actually getting worse Apr 2022 -> May 2023. 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  No clear-cut evidence of ILD:  2019 June CT scan of the chest: Read by Dr. Polly Cobia.  Concerning for ILD.  Reticulation also left-sided pleural effusion  May 2021 high-resolution CT chest: Read by Dr. Polly Cobia.  Indeterminate pattern for UIP versus alternate diagnosis.Marland Kitchen  Definite of ILD.  There is subpleural sparing.  Left-sided pleural effusion present.  Findings deemed is unchanged from prior.  I personally agree with the findings  April 2022 CT chest without contrast [not a high-resolution CT chest].  Personally thought ILD is worse.  Radiology seems to agree  November 2022 in May 2023 CT scan of the chest [again not high-resolution] definite worsening of ILD changes.  Increased groundglass component.  May 2023: CT - progressive ILD. Worsning GGO and reticulation. Trace left effuion  No results found.  UGI score 11/19/21:: Diffuse mild inflammation characterized by congestion (edema) and erosions was found in the entire examined stomach.   OV 11/20/2021  Subjective:  Patient ID: Brian Casey, male , DOB: [birth year] , age 25 y.o. , MRN: [id number] , ADDRESS: [address] [place] [address] PCP [name], MD Patient Care Team: [name], MD as PCP - General (Family Medicine) Minna Merritts, MD as Consulting Physician (Cardiology) Dahlia Byes, MD as Consulting Physician (Cardiothoracic Surgery)  This Provider for this visit: Treatment Team:  Attending Provider: Brand Males, MD  Type of visit: Video Circumstance: patient in BRL at home Identification of patient NOA CONSTANTE with 02/20/1951 and MRN [id number] - 2 person identifier Risks: Risks, benefits, limitations of telephone visit explained. Patient understood and verbalized agreement to proceed Anyone else on call: wife Patient location: his home This provider location: [address];  [place]; [place] [address]. [facility]. [phone]    11/20/2021 -returns for video visit.  ILD work-up in progress.   HPI Brian Casey 71 y.o. -  #ILD work-up: After his last visit I presented him at the thoracic multidisciplinary conference.  Dr. Burt Ek felt the CT scan was more consistent with alternative pattern possibly fibrotic NSIP or even HP given the upper lobe predominance and air trapping.  He subsequently had a high-resolution CT scan of the chest that was read by Dr. Vinnie Langton who feels CT scan is more consistent with chronic HP.  We went over the exposures again there is no current exposure.  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  While he remembers asbestos exposure he does not remember mold exposure but it was definitely water and there was a lot of dampness.  There is no evidence of asbestos plaques or UIP pattern on the CT scan of the chest.  Nevertheless it is progressive.  We discussed the differential diagnosis currently of chronic HP versus NSIP versus asbestosis versus IPF.  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  Based on this and the progression my recommendation to him was that we would start antifibrotic's.  Did indicate to him that there is a role for prednisone and immunomodulators in the setting of chronic NSIP or HP but given the side effect profile with prednisone [fatigue and insomnia] and also future potential risk for immunosuppression with Imuran I advised that antifibrotic's nintedanib would be first-line.  He is better now with 10 mg prednisone.  He is sleeping better.  Therefore also recommended continuing with prednisone.  Recommended holding off any restarting of the Imuran or consideration of CellCept as a third line option.  We will also going to hold off on surgical lung biopsy.  Indicated to him that in the long run he would need  to lose weight get fit and consider transplant as an option.  For these purposes I would like to avoid immunosuppression and also surgical lung biopsy.  In addition with 4 L exertion desaturation did not want to consider surgical lung biopsy at this point in time.  He and his wife are agreeable with the plan  #Therapeutic side effects: After stopping prednisone his insomnia and fatigue are better.  We discussed nintedanib.  He does have diverticulosis on the recent CT scan but has never had diverticulitis.  He does not have a history of GI bleeding.  No history of MI.  Recent echo was normal.  We discussed the diarrhea and liver toxicity monitoring of the rare side effects of nintedanib.  He is agreeable to start this.  #Hypoxemia: He told me that he is desaturating at home with 4 L but does well in rehab because of his machine.  I recommended oxygen titration test but my CMA indicated to me that he did fine with 4 L exertion in our office at last visit.  She will him to contact him and verify if he is still desaturating at home.  If so then we will have to do a detailed oxygen titration test.  He is still awaiting his Joselyn Arrow through the [place].  He does not want to do it through [facility] and it could take a while.  #Umbilical hernia repair.  This is being held off at this point.    SYMPTOM SCALE - ILD 11/04/2021  Current weight   O2 use RA but needing 4L [place] with exertin  Shortness of Breath 0 -> 5 scale with 5 being worst (score 6 If unable to do)  At rest 0  Simple tasks - showers, clothes change, eating, shaving 4  Household (dishes, doing bed, laundry) 4  Shopping 3  Walking level at own pace 3  Walking up Stairs 5  Total (30-36) Dyspnea Score 19      Non-dyspnea symptoms (0-> 5 scale) 11/04/2021  How bad is your cough? 2.5  How bad is your fatigue 3  How bad is nausea 0  How bad is vomiting?  0  How bad is diarrhea? 0  How bad is anxiety? 3  How bad is depression 2  Any chronic  pain - if so where and how bad 0   PFT     Latest Ref Rng & Units 11/11/2015    7:38 AM  PFT Results  FVC-Pre L 1.20   FVC-Predicted Pre % 28   FVC-Post L 1.35   FVC-Predicted Post % 32   Pre FEV1/FVC % % 60   Post FEV1/FCV % % 56   FEV1-Pre L 0.72  FEV1-Predicted Pre % 22   FEV1-Post L 0.76   DLCO uncorrected ml/min/mmHg 8.93   DLCO UNC% % 31   DLCO corrected ml/min/mmHg 10.84   DLCO COR %Predicted % 38   DLVA Predicted % 119   TLC L 2.47   TLC % Predicted % 38   RV % Predicted % 61     HRCT 11/13/21  Narrative & Impression  CLINICAL DATA:  71 year old male with history of cough and shortness of breath worsening since May 2023. Evaluate for interstitial lung disease.   EXAM: CT CHEST WITHOUT CONTRAST   TECHNIQUE: Multidetector CT imaging of the chest was performed following the standard protocol without intravenous contrast. High resolution imaging of the lungs, as well as inspiratory and expiratory imaging, was performed.   RADIATION DOSE REDUCTION: This exam was performed according to the departmental dose-optimization program which includes automated exposure control, adjustment of the mA and/or kV according to patient size and/or use of iterative reconstruction technique.   COMPARISON:  Chest CT 08/20/2021.   FINDINGS: Cardiovascular: Heart size is normal. There is no significant pericardial fluid, thickening or pericardial calcification. There is aortic atherosclerosis, as well as atherosclerosis of the great vessels of the mediastinum and the coronary arteries, including calcified atherosclerotic plaque in the left main, left anterior descending, left circumflex and right coronary arteries. Status post median sternotomy for CABG including LIMA to the LAD.   Mediastinum/Nodes: No pathologically enlarged mediastinal or hilar lymph nodes. Please note that accurate exclusion of hilar adenopathy is limited on noncontrast CT scans. Esophagus is  unremarkable in appearance. No axillary lymphadenopathy.   Lungs/Pleura: Widespread but patchy areas of ground-glass attenuation, septal thickening, subpleural reticulation, thickening of the peribronchovascular interstitium, cylindrical bronchiectasis and peripheral bronchiolectasis are noted. Some areas of apparent developing honeycombing are noted, most evident near the apex of the right upper lobe. Overall, findings appear relatively similar to the recent prior chest CT. Inspiratory and expiratory imaging demonstrates some mild air trapping indicative of mild small airways disease. No definite suspicious appearing pulmonary nodules or masses are noted. No pleural effusions.   Upper Abdomen: Aortic atherosclerosis.  Status post cholecystectomy.   Musculoskeletal: Median sternotomy wires. There are no aggressive appearing lytic or blastic lesions noted in the visualized portions of the skeleton.   IMPRESSION: 1. The appearance of the lungs is indicative of interstitial lung disease, with minimal progression compared to the most recent prior study, but definitive progression when compared to more remote prior examinations. 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 2. Aortic atherosclerosis, in addition to left main and three-vessel coronary artery disease. Status post median sternotomy for CABG including LIMA to the LAD.   Aortic Atherosclerosis (ICD10-I70.0).     Electronically Signed   By: Vinnie Langton M.D.   On: 11/15/2021 06:28    ECHO 11/12/21  IMPRESSIONS     1. Left ventricular ejection fraction, by estimation, is 60 to 65%. The  left ventricle has normal function. The left ventricle has no regional  wall motion abnormalities. There is mild left ventricular hypertrophy.  Left ventricular diastolic parameters   are consistent with Grade I diastolic dysfunction (impaired relaxation).   2. Right ventricular systolic function is normal. The right ventricular  size is normal.   3. The mitral valve is normal in structure. No evidence of mitral valve  regurgitation.   4. The aortic valve is tricuspid. Aortic valve  regurgitation is not  visualized. Aortic valve sclerosis is present, with no evidence of aortic  valve stenosis.   5. Aortic dilatation noted. There is mild dilatation of the aortic root,  measuring 39 mm.   6. The inferior vena cava is normal in size with greater than 50%  respiratory variability, suggesting right atrial pressure of 3 mmHg.    Aug 2023 ILD confi with Dr Burt Ek  In 2019 early ILD with reticuation. SIgnifcant progressiont hrough latest CT in May 2023. Lot of upper lobe > Lower lobe back then and now. OVerall pattern: ALTERNATIVE CATEGRORy. DDx is FIBROTIC HP (2021 Exp phase imaging was inadequate , 2020 there was mild air trappng). In May 2023 - more GGO  ? flare. Overall patch peribronchosvascular. NOT c/w UIP. Has pleural thicekning on left but prob due to cardiac urgery. No calcifiied pleural plaques  Consider bix if Risk ok    has a past medical history of Acute renal failure ([place]) (11/06/2015), Adenomatous colon polyp, Anxiety, Arthritis, Depression, Diverticulosis, GERD (gastroesophageal reflux disease), High cholesterol, History of bronchitis, Hypertension (10/17/2013), Hyperthyroidism, Hypothyroidism, ILD (interstitial lung disease) ([place]), New onset atrial fibrillation ([place]) (11/06/2015), NSTEMI (non-ST elevated myocardial infarction) ([place]) (11/07/2015), Thyroid disease, Type II diabetes mellitus ([place]), and Wears glasses.   reports that he quit smoking about 6 years ago. His smoking use included cigarettes. He has a 48.00 pack-year smoking history. He has never used smokeless tobacco.  Past Surgical History:  Procedure Laterality Date   CARDIAC CATHETERIZATION  Right 11/06/2015   Procedure: Left Heart Cath and Coronary Angiography;  Surgeon: Minna Merritts, MD;  Location: [facility];  Service: Cardiovascular;  Laterality: Right;   CHOLECYSTECTOMY N/A 01/17/2016   Procedure: LAPAROSCOPIC CHOLECYSTECTOMY WITH INTRAOPERATIVE CHOLANGIOGRAM;  Surgeon: Fanny Skates, MD;  Location: [facility];  Service: General;  Laterality: N/A;   COLONOSCOPY     COLONOSCOPY N/A 11/19/2021   Procedure: COLONOSCOPY;  Surgeon: Toledo, Benay Pike, MD;  Location: [facility];  Service: Gastroenterology;  Laterality: N/A;   CORONARY ARTERY BYPASS GRAFT N/A 11/14/2015   Procedure: CORONARY ARTERY BYPASS GRAFTING (CABG) x 4;  Surgeon: Ivin Poot, MD;  Location: [facility];  Service: Open Heart Surgery;  Laterality: N/A;   ENDARTERECTOMY Left 11/14/2015   Procedure: LEFT ENDARTERECTOMY CAROTID;  Surgeon: Waynetta Sandy, MD;  Location: [facility];  Service: Vascular;  Laterality: Left;   ESOPHAGOGASTRODUODENOSCOPY N/A 11/19/2021   Procedure: ESOPHAGOGASTRODUODENOSCOPY (EGD);  Surgeon: Toledo, Benay Pike, MD;  Location: [facility];  Service: Gastroenterology;  Laterality: N/A;   FINGER GANGLION CYST EXCISION Left X 3   "index finger X 2; thumb X 1"   [place] OF MESH N/A 11/16/2013   Procedure: INSERTION OF MESH;  Surgeon: Imogene Burn. Georgette Dover, MD;  Location: [facility];  Service: General;  Laterality: N/A;   IR GENERIC HISTORICAL  11/08/2015   IR PERC CHOLECYSTOSTOMY 11/08/2015 Corrie Mckusick, DO [facility]   IR GENERIC HISTORICAL  12/13/2015   IR CHOLANGIOGRAM EXISTING TUBE 12/13/2015 [facility]   IR GENERIC HISTORICAL  12/25/2015   IR CATHETER TUBE CHANGE 12/25/2015 Sandi Mariscal, MD [facility]   IR GENERIC HISTORICAL  12/12/2015   IR RADIOLOGIST EVAL & MGMT 12/12/2015 Greggory Keen, MD [facility]   TEE WITHOUT CARDIOVERSION N/A 11/14/2015   Procedure: TRANSESOPHAGEAL ECHOCARDIOGRAM (TEE);  Surgeon: Ivin Poot, MD;   Location: [facility];  Service: Open Heart Surgery;  Laterality: N/A;  UMBILICAL HERNIA REPAIR N/A 11/16/2013   Procedure: UMBILICAL HERNIA REPAIR;  Surgeon: Imogene Burn. Georgette Dover, MD;  Location: [facility];  Service: General;  Laterality: N/A;   VEIN HARVEST Right 11/14/2015   Procedure: RIGHT LEG GREATER SAPHENOUS VEIN HARVEST;  Surgeon: Ivin Poot, MD;  Location: [facility];  Service: Open Heart Surgery;  Laterality: Right;    Allergies  Allergen Reactions   Penicillins Nausea And Vomiting and Other (See Comments)    REACTION: Vomiting Has patient had a PCN reaction causing immediate rash, facial/tongue/throat swelling, SOB or lightheadedness with hypotension: Yes Has patient had a PCN reaction causing severe rash involving mucus membranes or skin necrosis: No Has patient had a PCN reaction that required hospitalization No Has patient had a PCN reaction occurring within the last 10 years: No If all of the above answers are "NO", then may proceed with Cephalosporin use.   Amlodipine Swelling   Bupropion    Bupropion Hcl     REACTION: Severe constipation, insomnia   Chlorthalidone Other (See Comments)    Other reaction(s): Hyponatremia   Metformin Diarrhea   Terazosin Other (See Comments)    Immunization History  Administered Date(s) Administered   Influenza Split 01/26/2011, 02/10/2012   Influenza Whole 01/22/2004, 02/05/2008, 12/26/2008   Influenza,inj,Quad PF,6+ Mos 12/29/2012, 01/10/2014, 12/11/2014   Influenza-Unspecified 12/25/2016, 12/22/2017, 12/21/2018, 12/07/2019, 12/24/2020   PFIZER Comirnaty(Gray Top)Covid-19 Tri-Sucrose Vaccine 04/09/2019, 05/02/2019, 12/28/2019, 01/01/2021   Pneumococcal Conjugate-13 03/29/2014, 11/06/2014, 11/15/2017   Pneumococcal Polysaccharide-23 11/05/2016   Td 08/22/1998, 08/18/2013   Zoster, Live 03/29/2014    Family History  Problem Relation Age of Onset   Hypertension Mother    Diabetes Mother    Depression Mother    Colon cancer Mother     Hyperthyroidism Father    Hypertension Father    Prostate cancer Father    Diabetes Maternal Grandmother    Lung cancer Maternal Grandmother        non smoker     Current Outpatient Medications:    albuterol (VENTOLIN HFA) 108 (90 Base) MCG/ACT inhaler, , Disp: , Rfl:    ALPRAZolam (XANAX) 0.25 MG tablet, Take by mouth., Disp: , Rfl:    aspirin EC 81 MG tablet, Take 81 mg by mouth daily. Swallow whole., Disp: , Rfl:    atorvastatin (LIPITOR) 40 MG tablet, Take 40 mg by mouth daily., Disp: , Rfl:    budesonide (PULMICORT) 0.25 MG/2ML nebulizer solution, INHALE CONTENTS OF ONE VIAL/NEBULE VIA NEBULIZER TWO TIMES A DAY (Patient not taking: Reported on 11/05/2021), Disp: , Rfl:    carvedilol (COREG) 12.5 MG tablet, Take 12.5 mg by mouth 2 (two) times daily with a meal., Disp: , Rfl:    furosemide (LASIX) 20 MG tablet, Take 20 mg by mouth daily., Disp: , Rfl:    ipratropium-albuterol (DUONEB) 0.5-2.5 (3) MG/3ML SOLN, Take by nebulization., Disp: , Rfl:    levothyroxine (SYNTHROID) 100 MCG tablet, Take 100 mcg by mouth daily before breakfast., Disp: , Rfl:    levothyroxine (SYNTHROID, LEVOTHROID) 100 MCG tablet, Take 1 tablet (100 mcg total) by mouth daily before breakfast. OFFICE VISIT WITH  LABS REQUIRED FOR ADDITIONAL REFILLS (Patient not taking: Reported on 11/05/2021), Disp: 30 tablet, Rfl: 0   omeprazole (PRILOSEC) 20 MG capsule, Take 1 capsule (20 mg total) by mouth daily. OFFICE VISIT REQUIRED FOR ADDITIONAL REFILLS, Disp: 30 capsule, Rfl: 0   predniSONE (DELTASONE) 20 MG tablet, , Disp: , Rfl:    tadalafil (CIALIS) 5 MG tablet, Take 1 tablet by  mouth as needed., Disp: , Rfl:       Objective:   There were no vitals filed for this visit.  Estimated body mass index is 32 kg/m as calculated from the following:   Height as of 11/05/21: 5' 7.5" (1.715 m).   Weight as of 11/05/21: 207 lb 6 oz (94.1 kg).  _0 @  There were no vitals filed for this visit.   Physical  Exam    General: No distress. Looks wel on vide Neuro: Alert and Oriented x 3. GCS 15. Speech normal Psych: Pleasant         Assessment:       ICD-10-CM   1. ILD (interstitial lung disease) ([place])  J84.9     2. 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 Chronic respiratory failure with hypoxia ([facility])  J96.11     4. Current chronic use of systemic steroids  Z79.52     5. DOE (dyspnea on exertion)  R06.09          Plan:     Patient Instructions     ICD-10-CM   1. ILD (interstitial lung disease) ([place])  J84.9     2. Chronic respiratory failure with hypoxia (HCC)  J96.11     3. DOE (dyspnea on exertion)  R06.09     4. Pre-operative respiratory examination  Z01.811     5. 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  [employer] are also desaturating with exertion and requiring 4 L of nasal oxygen to correct suggesting quite advanced disease  Glad you are better with reduced prednisome  Plan - cancel referral for lung biopsy due to altnerate strategy and risk  v benefit - -reorder ONO test on room air  - our office to see if can get it done faster through [facility] 4 L of nasal cannula oxygen with exertion  - do o2 titration [facility] for amount of o2 need  - our office to see if you need [facility] approval -start Ofev 134m twice daily  - extensisvely counseled -Hold off restart Imuran we can do this or cellcept in future depending on course with ofev -STat on prednisone to 10 mg/day - can aim t o reduce to 575mper day in future depending on course -  continue prilosec --Continue pulmonary rehabilitation for now - change nebulziers to prn - in the long run need to lose weight esp if transplant an option  - talk to PCP HeMaryland PinkMD - Wegovy/Ozempic  Obesity  - gMI 32 on 207#  Plan  - goal is 170#  Pre-operative respiratory examination  -Future consideration of umbilical hernia surgery  - being held off by surgeon  Plan - monitor  Follow-up - Return to see Dr. RaChase Callerr APP  30-minute visit in less than 6 weeks  - video visiti fine to see uptake with ofev   ( Level 05 visit: Estb 40-54 min n  visit type: video virtual visit  in total care time and counseling or/and coordination of care by this undersigned  MD - Dr Brand Males. This includes one or more of the following on this same day 11/20/2021: pre-charting, chart review, note writing, documentation discussion of test results, diagnostic or treatment recommendations, prognosis, risks and benefits of management options, instructions, education, compliance or risk-factor reduction. It excludes time spent by the [place] or office staff in the care of the patient. Actual time 50 min)   SIGNATURE    Dr. Brand Males, M.D., F.C.C.P,  Pulmonary and Critical Care Medicine Staff Physician, [place] Director - Interstitial Lung Disease  Program  [facility] [place], [place], [address]  Pager: [phone], If no answer or between  15:00h - 7:00h: call [phone] Telephone: [phone]  5:09 PM 11/20/2021

## 2021-11-21 ENCOUNTER — Encounter: Payer: No Typology Code available for payment source | Attending: Pulmonary Disease | Admitting: *Deleted

## 2021-11-21 DIAGNOSIS — J849 Interstitial pulmonary disease, unspecified: Secondary | ICD-10-CM | POA: Insufficient documentation

## 2021-11-21 LAB — SURGICAL PATHOLOGY

## 2021-11-21 MED ORDER — PREDNISONE 10 MG PO TABS: 10.0000 mg | ORAL_TABLET | Freq: Every day | ORAL | 3 refills | Status: DC

## 2021-11-21 NOTE — Progress Notes (Signed)
Daily Session Note  Patient Details  Name: Brian Casey MRN: [id number] Date of Birth: 10-21-1950 Referring Provider:   April Manson Pulmonary Rehab from 09/29/2021 in [facility] Cardiac and Pulmonary Rehab  Referring Provider Cyndie Chime MD ([place])  [facility] Pulmonologist Dr. Lanney Gins       Encounter Date: 11/21/2021  Check In:  Session Check In - 11/21/21 1250       Check-In   Supervising physician immediately available to respond to emergencies See telemetry face sheet for immediately available ER MD    Location [facility]-Cardiac & Pulmonary Rehab    Staff Present Nyoka Cowden, RN, BSN, Lauretta Grill, RCP,RRT,BSRT;Melissa [place], [place], LDN    Virtual Visit No    Medication changes reported     No    Fall or balance concerns reported    No    Tobacco Cessation No Change    Warm-up and Cool-down Performed on first and last piece of equipment    Resistance Training Performed Yes    VAD Patient? No      Pain Assessment   Currently in Pain? No/denies                Social History   Tobacco Use  Smoking Status Former   Packs/day: 1.00   Years: 48.00   Total pack years: 48.00   Types: Cigarettes   Quit date: 11/07/2015   Years since quitting: 6.0  Smokeless Tobacco Never    Goals Met:  Independence with exercise equipment Exercise tolerated well No report of concerns or symptoms today  Goals Unmet:  Not Applicable  Comments: Pt able to follow exercise prescription today without complaint.  Will continue to monitor for progression.    Dr. Emily Filbert is Medical Director for [facility].  Dr. Ottie Glazier is Medical Director for [facility] Pulmonary Rehabilitation.

## 2021-11-21 NOTE — Progress Notes (Signed)
Daily Session Note  Patient Details  Name: Brian Casey MRN: [id number] Date of Birth: 11-04-1950 Referring Provider:   April Manson Pulmonary Rehab from 09/29/2021 in [facility] Cardiac and Pulmonary Rehab  Referring Provider Cyndie Chime MD ([place])  [facility] Pulmonologist Dr. Lanney Gins       Encounter Date: 11/21/2021  Check In:  Session Check In - 11/21/21 1250       Check-In   Supervising physician immediately available to respond to emergencies See telemetry face sheet for immediately available ER MD    Location [facility]-Cardiac & Pulmonary Rehab    Staff Present Nyoka Cowden, RN, BSN, Lauretta Grill, RCP,RRT,BSRT;Melissa [place], [place], LDN    Virtual Visit No    Medication changes reported     No    Fall or balance concerns reported    No    Tobacco Cessation No Change    Warm-up and Cool-down Performed on first and last piece of equipment    Resistance Training Performed Yes    VAD Patient? No      Pain Assessment   Currently in Pain? No/denies                Social History   Tobacco Use  Smoking Status Former   Packs/day: 1.00   Years: 48.00   Total pack years: 48.00   Types: Cigarettes   Quit date: 11/07/2015   Years since quitting: 6.0  Smokeless Tobacco Never    Goals Met:  Independence with exercise equipment  Goals Unmet:  O2 Sat  Comments: Pt able to follow exercise prescription today without complaint.  Will continue to monitor for progression.  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 Stopped to rest. O2 increased to 6lpm for several minutes. pAO2 up to 89, lpm reduced back to 4. Pt resumed track at a lower effort. Pulse remained in upper 80's-90.   Dr. Emily Filbert is Medical Director for [facility].  Dr. Ottie Glazier is Medical Director for [facility] Pulmonary Rehabilitation.

## 2021-11-21 NOTE — Telephone Encounter (Signed)
Called and spoke with pt about visit 8/31 and stated to both pt and spouse everything that we were going to get done for pt. Both verbalized understnading. OFEV paperwork started. Will send Rx for 16m prednisone to pharmacy for pt to have on file for when pt finishes the 224mprednisone.  Appt scheduled for pt for follow up after OFEV start. [place]

## 2021-11-25 ENCOUNTER — Ambulatory Visit: Payer: No Typology Code available for payment source

## 2021-11-25 ENCOUNTER — Telehealth: Payer: Self-pay | Admitting: Pharmacist

## 2021-11-25 DIAGNOSIS — J849 Interstitial pulmonary disease, unspecified: Secondary | ICD-10-CM

## 2021-11-25 MED ORDER — OFEV 150 MG PO CAPS: 150.0000 mg | ORAL_CAPSULE | Freq: Two times a day (BID) | ORAL | 2 refills | Status: DC

## 2021-11-25 NOTE — Telephone Encounter (Signed)
Rceived fax from [facility] that patient's Ofev rx and clinicals need to be faxed to [facility]. Refaxed  Phone:  [phone] Fax:  [phone]   Knox Saliva, PharmD, MPH, BCPS, CPP Clinical Pharmacist (Rheumatology and Pulmonology)

## 2021-11-25 NOTE — Telephone Encounter (Signed)
[facility]  [facility] Pharmacy team will f/u with patient  Fax: [phone]: [phone]  I spoke with patient regarding rx and that it may take 2 weeks to receive medication. [facility] but if not will pursue patient assistance. Patient advised to call clinic once medication is received as he will need updated labwork which we [place] [facility] [place] Provided him with my direct phone number  DKnox Saliva PharmD, MPH, BCPS, CPP Clinical Pharmacist (Rheumatology and Pulmonology)

## 2021-11-25 NOTE — Telephone Encounter (Signed)
Received notification via Epic secure cha that patient is to start Ofev. Per review of phone note, patient's dose will be 145m twice daily.  [place] both [facility] and [place] Will pursue [place] first

## 2021-11-26 ENCOUNTER — Encounter: Payer: Self-pay | Admitting: *Deleted

## 2021-11-26 ENCOUNTER — Encounter: Payer: No Typology Code available for payment source | Admitting: *Deleted

## 2021-11-26 DIAGNOSIS — J849 Interstitial pulmonary disease, unspecified: Secondary | ICD-10-CM

## 2021-11-26 NOTE — Progress Notes (Signed)
Daily Session Note  Patient Details  Name: Brian Casey MRN: [id number] Date of Birth: 23-Oct-1950 Referring Provider:   April Manson Pulmonary Rehab from 09/29/2021 in [facility] Cardiac and Pulmonary Rehab  Referring Provider Cyndie Chime MD ([place])  [facility] Pulmonologist Dr. Lanney Gins       Encounter Date: 11/26/2021  Check In:  Session Check In - 11/26/21 1420       Check-In   Supervising physician immediately available to respond to emergencies See telemetry face sheet for immediately available ER MD    Location [facility]-Cardiac & Pulmonary Rehab    Staff Present Renita Papa, RN BSN;Joseph Wiggins, RCP,RRT,BSRT;Noah Tickle, BS, Exercise Physiologist;Susanne Bice, RN, BSN, CCRP    Virtual Visit No    Medication changes reported     No    Fall or balance concerns reported    No    Warm-up and Cool-down Performed on first and last piece of equipment    Resistance Training Performed Yes    VAD Patient? No    PAD/SET Patient? No      Pain Assessment   Currently in Pain? No/denies                Social History   Tobacco Use  Smoking Status Former   Packs/day: 1.00   Years: 48.00   Total pack years: 48.00   Types: Cigarettes   Quit date: 11/07/2015   Years since quitting: 6.0  Smokeless Tobacco Never    Goals Met:  Independence with exercise equipment Exercise tolerated well No report of concerns or symptoms today Strength training completed today  Goals Unmet:  Not Applicable  Comments: Pt able to follow exercise prescription today without complaint.  Will continue to monitor for progression.    Dr. Emily Filbert is Medical Director for [facility].  Dr. Ottie Glazier is Medical Director for [facility] Pulmonary Rehabilitation.

## 2021-11-26 NOTE — Progress Notes (Signed)
Pulmonary Individual Treatment Plan  Patient Details  Name: Brian Casey MRN: [id number] Date of Birth: Jan 05, 1951 Referring Provider:   April Manson Pulmonary Rehab from 09/29/2021 in [facility] Cardiac and Pulmonary Rehab  Referring Provider Cyndie Chime MD ([place])  [facility] Pulmonologist Dr. Lanney Gins       Initial Encounter Date:  Flowsheet Row Pulmonary Rehab from 09/29/2021 in [facility] Cardiac and Pulmonary Rehab  Date 09/29/21       Visit Diagnosis: ILD (interstitial lung disease) ([place])  Patient's Home Medications on Admission:  Current Outpatient Medications:    albuterol (VENTOLIN HFA) 108 (90 Base) MCG/ACT inhaler, , Disp: , Rfl:    ALPRAZolam (XANAX) 0.25 MG tablet, Take by mouth., Disp: , Rfl:    aspirin EC 81 MG tablet, Take 81 mg by mouth daily. Swallow whole., Disp: , Rfl:    atorvastatin (LIPITOR) 40 MG tablet, Take 40 mg by mouth daily., Disp: , Rfl:    budesonide (PULMICORT) 0.25 MG/2ML nebulizer solution, INHALE CONTENTS OF ONE VIAL/NEBULE VIA NEBULIZER TWO TIMES A DAY (Patient not taking: Reported on 11/05/2021), Disp: , Rfl:    carvedilol (COREG) 12.5 MG tablet, Take 12.5 mg by mouth 2 (two) times daily with a meal., Disp: , Rfl:    furosemide (LASIX) 20 MG tablet, Take 20 mg by mouth daily., Disp: , Rfl:    ipratropium-albuterol (DUONEB) 0.5-2.5 (3) MG/3ML SOLN, Take by nebulization., Disp: , Rfl:    levothyroxine (SYNTHROID) 100 MCG tablet, Take 100 mcg by mouth daily before breakfast., Disp: , Rfl:    levothyroxine (SYNTHROID, LEVOTHROID) 100 MCG tablet, Take 1 tablet (100 mcg total) by mouth daily before breakfast. OFFICE VISIT WITH  LABS REQUIRED FOR ADDITIONAL REFILLS (Patient not taking: Reported on 11/05/2021), Disp: 30 tablet, Rfl: 0   Nintedanib (OFEV) 150 MG CAPS, Take 1 capsule (150 mg total) by mouth 2 (two) times daily., Disp: 60 capsule, Rfl: 2   omeprazole (PRILOSEC) 20 MG capsule, Take 1 capsule (20 mg total) by mouth daily. OFFICE VISIT REQUIRED FOR  ADDITIONAL REFILLS, Disp: 30 capsule, Rfl: 0   predniSONE (DELTASONE) 10 MG tablet, Take 1 tablet (10 mg total) by mouth daily with breakfast., Disp: 30 tablet, Rfl: 3   tadalafil (CIALIS) 5 MG tablet, Take 1 tablet by mouth as needed., Disp: , Rfl:   Past Medical History: Past Medical History:  Diagnosis Date   Acute renal failure ([place]) 11/06/2015   [name] 11/06/2015   Adenomatous colon polyp    Anxiety    Arthritis    "lower spine; fingers" (11/07/2015)   Depression    Diverticulosis    GERD (gastroesophageal reflux disease)    High cholesterol    History of bronchitis    Hypertension 10/17/2013   Hyperthyroidism    "had it radiated in his '54s"   Hypothyroidism    ILD (interstitial lung disease) ([place])    New onset atrial fibrillation ([place]) 11/06/2015   [name] 11/07/2015   NSTEMI (non-ST elevated myocardial infarction) ([place]) 11/07/2015   Thyroid disease    Type II diabetes mellitus ([place])    type II   Wears glasses     Tobacco Use: Social History   Tobacco Use  Smoking Status Former   Packs/day: 1.00   Years: 48.00   Total pack years: 48.00   Types: Cigarettes   Quit date: 11/07/2015   Years since quitting: 6.0  Smokeless Tobacco Never    Labs: Review Flowsheet  More data exists      Latest Ref Rng &  Units 11/09/2015 11/13/2015 11/14/2015 11/15/2015 01/15/2016  Labs for ITP Cardiac and Pulmonary Rehab  Trlycerides <150 mg/dL 437  - - - -  Hemoglobin A1c 4.8 - 5.6 % - 7.1  - - 5.5   PH, Arterial 7.350 - 7.450 - 7.482  7.361  7.308  7.366  7.421  7.352  7.361  -  PCO2 arterial 35.0 - 45.0 mmHg - 32.2  40.3  52.3  38.2  39.5  38.4  39.3  -  Bicarbonate 20.0 - 24.0 mEq/L - 23.8  23.0  26.3  21.9  25.7  21.4  22.5  -  TCO2 0 - 100 mmol/L - 24._0 -  Acid-base deficit 0.0 - 2.0 mmol/L - - 2.0  3.0  4.0  3.0  -  O2 Saturation % - 97.1  97.0  100.0  99.0  100.0  61.7  95.0  98.0  -     Pulmonary Assessment  Scores:  Pulmonary Assessment Scores     Row Name 09/29/21 1351         ADL UCSD   ADL Phase Entry     SOB Score total 59     Rest 1     Walk 3     Stairs 5     Bath 2     Dress 2     Shop 2       CAT Score   CAT Score 23       mMRC Score   mMRC Score 2              UCSD: Self-administered rating of dyspnea associated with activities of daily living (ADLs) 6-point scale (0 = "not at all" to 5 = "maximal or unable to do because of breathlessness")  Scoring Scores range from 0 to 120.  Minimally important difference is 5 units  CAT: CAT can identify the health impairment of COPD patients and is better correlated with disease progression.  CAT has a scoring range of zero to 40. The CAT score is classified into four groups of low (less than 10), medium (10 - 20), high (21-30) and very high (31-40) based on the impact level of disease on health status. A CAT score over 10 suggests significant symptoms.  A worsening CAT score could be explained by an exacerbation, poor medication adherence, poor inhaler technique, or progression of COPD or comorbid conditions.  CAT MCID is 2 points  mMRC: mMRC (Modified Medical Research Council) Dyspnea Scale is used to assess the degree of baseline functional disability in patients of respiratory disease due to dyspnea. No minimal important difference is established. A decrease in score of 1 point or greater is considered a positive change.   Pulmonary Function Assessment:  Pulmonary Function Assessment - 09/29/21 1351       Breath   Shortness of Breath Yes;Fear of Shortness of Breath;Limiting activity             Exercise Target Goals: Exercise Program Goal: Individual exercise prescription set using results from initial 6 min walk test and THRR while considering  patient's activity barriers and safety.   Exercise Prescription Goal: Initial exercise prescription builds to 30-45 minutes a day of aerobic activity, 2-3 days per  week.  Home exercise guidelines will be given to patient during program as  part of exercise prescription that the participant will acknowledge.  Education: Aerobic Exercise: - Group verbal and visual presentation on the components of exercise prescription. Introduces F.I.T.T principle from ACSM for exercise prescriptions.  Reviews F.I.T.T. principles of aerobic exercise including progression. Written material given at graduation. Flowsheet Row Cardiac Rehab from 04/01/2016 in [facility] Cardiac and Pulmonary Rehab  Date 02/17/16  Educator [facility]  Instruction Review Code (retired) 2- meets goals/outcomes       Education: Resistance Exercise: - Group verbal and visual presentation on the components of exercise prescription. Introduces F.I.T.T principle from ACSM for exercise prescriptions  Reviews F.I.T.T. principles of resistance exercise including progression. Written material given at graduation.    Education: Exercise & Equipment Safety: - Individual verbal instruction and demonstration of equipment use and safety with use of the equipment. Flowsheet Row Pulmonary Rehab from 11/06/2021 in [facility] Cardiac and Pulmonary Rehab  Date 09/29/21  Educator [facility]  Instruction Review Code 1- Verbalizes Understanding       Education: Exercise Physiology & General Exercise Guidelines: - Group verbal and written instruction with models to review the exercise physiology of the cardiovascular system and associated critical values. Provides general exercise guidelines with specific guidelines to those with heart or lung disease.    Education: Flexibility, Balance, Mind/Body Relaxation: - Group verbal and visual presentation with interactive activity on the components of exercise prescription. Introduces F.I.T.T principle from ACSM for exercise prescriptions. Reviews F.I.T.T. principles of flexibility and balance exercise training including progression. Also discusses the mind body connection.  Reviews various  relaxation techniques to help reduce and manage stress (i.e. Deep breathing, progressive muscle relaxation, and visualization). Balance handout provided to take home. Written material given at graduation. Flowsheet Row Cardiac Rehab from 04/01/2016 in [facility] Cardiac and Pulmonary Rehab  Date 02/19/16  Educator [facility]  Instruction Review Code (retired) 2- meets goals/outcomes       Activity Barriers & Risk Stratification:  Activity Barriers & Cardiac Risk Stratification - 09/29/21 1330       Activity Barriers & Cardiac Risk Stratification   Activity Barriers Shortness of Breath;Deconditioning;Muscular Weakness;Balance Concerns             6 Minute Walk:  6 Minute Walk     Row Name 09/29/21 1200         6 Minute Walk   Phase Initial     Distance 420 feet     Walk Time 2.4 minutes  stopped at 2:24 due to desaturation to 78%     # of Rest Breaks 0     MPH 1.98     METS 1.26     RPE 13     Perceived Dyspnea  3     VO2 Peak 4.41     Symptoms Yes (comment)     Comments SOB, desaturation (75% lowest)     Resting HR 66 bpm     Resting BP 126/64     Resting Oxygen Saturation  95 %     Exercise Oxygen Saturation  during 6 min walk 78 %  once seated dropped as low as 75%     Max Ex. HR 93 bpm     Max Ex. BP 152/70     2 Minute Post BP 128/64       Interval HR   1 Minute HR 85     2 Minute HR 92     3 Minute HR 93     2 Minute Post HR 69  Interval Heart Rate? Yes       Interval Oxygen   Interval Oxygen? Yes     Baseline Oxygen Saturation % 95 %     1 Minute Oxygen Saturation % 88 %     1 Minute Liters of Oxygen 0 L  Room Air     2 Minute Oxygen Saturation % 82 %     2 Minute Liters of Oxygen 0 L     3 Minute Oxygen Saturation % 78 %  at 2:24 75% once seated     3 Minute Liters of Oxygen 0 L     2 Minute Post Oxygen Saturation % 89 %     2 Minute Post Liters of Oxygen 0 L             Oxygen Initial Assessment:  Oxygen Initial Assessment - 09/29/21 1350        Home Oxygen   Home Oxygen Device None    Sleep Oxygen Prescription None    Home Exercise Oxygen Prescription None    Home Resting Oxygen Prescription None    Compliance with Home Oxygen Use Yes      Initial 6 min Walk   Oxygen Used None      Program Oxygen Prescription   Program Oxygen Prescription Continuous;E-Tanks    Liters per minute 2    Comments order sent to MD for continous use      Intervention   Short Term Goals To learn and demonstrate proper pursed lip breathing techniques or other breathing techniques. ;To learn and understand importance of monitoring SPO2 with pulse oximeter and demonstrate accurate use of the pulse oximeter.;To learn and demonstrate proper use of respiratory medications;To learn and exhibit compliance with exercise, home and travel O2 prescription;To learn and understand importance of maintaining oxygen saturations>88%    Long  Term Goals Exhibits proper breathing techniques, such as pursed lip breathing or other method taught during program session;Demonstrates proper use of MDI's;Compliance with respiratory medication;Exhibits compliance with exercise, home  and travel O2 prescription;Verbalizes importance of monitoring SPO2 with pulse oximeter and return demonstration;Maintenance of O2 saturations>88%             Oxygen Re-Evaluation:  Oxygen Re-Evaluation     Row Name 10/13/21 1109 10/27/21 1121 11/12/21 1139         Program Oxygen Prescription   Program Oxygen Prescription -- Continuous;E-Tanks Continuous;E-Tanks     Liters per minute -- 2 4     Comments -- On oxygen for exercise. On oxygen for exercise.       Home Oxygen   Home Oxygen Device -- None  Will ask MD at appt 8/15 None     Sleep Oxygen Prescription -- None None     Home Exercise Oxygen Prescription -- None  Asking MD about prescription Continuous     Liters per minute -- -- 4     Home Resting Oxygen Prescription -- None None     Compliance with Home Oxygen Use -- Yes Yes        Goals/Expected Outcomes   Short Term Goals -- To learn and demonstrate proper pursed lip breathing techniques or other breathing techniques. ;To learn and understand importance of monitoring SPO2 with pulse oximeter and demonstrate accurate use of the pulse oximeter.;To learn and demonstrate proper use of respiratory medications;To learn and exhibit compliance with exercise, home and travel O2 prescription;To learn and understand importance of maintaining oxygen saturations>88% To learn and demonstrate proper pursed lip breathing techniques  or other breathing techniques. ;To learn and understand importance of monitoring SPO2 with pulse oximeter and demonstrate accurate use of the pulse oximeter.;To learn and demonstrate proper use of respiratory medications;To learn and exhibit compliance with exercise, home and travel O2 prescription;To learn and understand importance of maintaining oxygen saturations>88%     Long  Term Goals -- Exhibits proper breathing techniques, such as pursed lip breathing or other method taught during program session;Demonstrates proper use of MDI's;Compliance with respiratory medication;Exhibits compliance with exercise, home  and travel O2 prescription;Verbalizes importance of monitoring SPO2 with pulse oximeter and return demonstration;Maintenance of O2 saturations>88% Exhibits proper breathing techniques, such as pursed lip breathing or other method taught during program session;Demonstrates proper use of MDI's;Compliance with respiratory medication;Exhibits compliance with exercise, home  and travel O2 prescription;Verbalizes importance of monitoring SPO2 with pulse oximeter and return demonstration;Maintenance of O2 saturations>88%     Comments Reviewed PLB technique with pt.  Talked about how it works and it's importance in maintaining their exercise saturations. Brian Casey does not have oxygen for home yet. EP did not go over home exercise at this time due to wanting home O2  prescription first. He is compliant with it as rehab and responds to it well. He did confirm that he has a pulse ox at home but does not check his O2 while during activity. I encouraged him to start that immediately and to note and stop when he is below 88%. He has been practicing PLB since he learned it his firts day at rehab and feels it helps him tremendously. Brian Casey is up to 4L of oxygen with exercise. His O2 saturation has been dropping very quickly with exercise. He has been practicing PLB since learning it in rehab and feels that it has helped. He has been checking his O2 at home using his personal pulse ox. He has a biopsy later this week to specifically diagnose his ILD.     Goals/Expected Outcomes Short: Become more profiecient at using PLB.   Long: Become independent at using PLB. Short: Start checking O2 during activity at home, stop and PLB at 88% or low/ talk to doc about home o2 prescription Long: Become efficient at PLB Short: Continue to use pulse ox to check O2 at home. Long: Become more efficient at PLB.              Oxygen Discharge (Final Oxygen Re-Evaluation):  Oxygen Re-Evaluation - 11/12/21 1139       Program Oxygen Prescription   Program Oxygen Prescription Continuous;E-Tanks    Liters per minute 4    Comments On oxygen for exercise.      Home Oxygen   Home Oxygen Device None    Sleep Oxygen Prescription None    Home Exercise Oxygen Prescription Continuous    Liters per minute 4    Home Resting Oxygen Prescription None    Compliance with Home Oxygen Use Yes      Goals/Expected Outcomes   Short Term Goals To learn and demonstrate proper pursed lip breathing techniques or other breathing techniques. ;To learn and understand importance of monitoring SPO2 with pulse oximeter and demonstrate accurate use of the pulse oximeter.;To learn and demonstrate proper use of respiratory medications;To learn and exhibit compliance with exercise, home and travel O2 prescription;To  learn and understand importance of maintaining oxygen saturations>88%    Long  Term Goals Exhibits proper breathing techniques, such as pursed lip breathing or other method taught during program session;Demonstrates proper use of MDI's;Compliance with respiratory  medication;Exhibits compliance with exercise, home  and travel O2 prescription;Verbalizes importance of monitoring SPO2 with pulse oximeter and return demonstration;Maintenance of O2 saturations>88%    Comments Brian Casey is up to 4L of oxygen with exercise. His O2 saturation has been dropping very quickly with exercise. He has been practicing PLB since learning it in rehab and feels that it has helped. He has been checking his O2 at home using his personal pulse ox. He has a biopsy later this week to specifically diagnose his ILD.    Goals/Expected Outcomes Short: Continue to use pulse ox to check O2 at home. Long: Become more efficient at PLB.             Initial Exercise Prescription:  Initial Exercise Prescription - 09/29/21 1300       Date of Initial Exercise RX and Referring Provider   Date 09/29/21    Referring Provider Cyndie Chime MD ([place])   Primary Pulmonologist Dr. Lanney Gins     Oxygen   Oxygen Continuous    Liters 2    Maintain Oxygen Saturation 88% or higher      REL-XR   Level 1    Speed 50    Minutes 15    METs 2      Biostep-RELP   Level 2    SPM 50    Minutes 15    METs 2      Track   Laps 25    Minutes 15    METs 2.36      Prescription Details   Frequency (times per week) 3    Duration Progress to 30 minutes of continuous aerobic without signs/symptoms of physical distress      Intensity   THRR 40-80% of Max Heartrate 100-134    Ratings of Perceived Exertion 11-13    Perceived Dyspnea 0-4      Progression   Progression Continue to progress workloads to maintain intensity without signs/symptoms of physical distress.      Resistance Training   Training Prescription Yes    Weight 3 lb     Reps 10-15             Perform Capillary Blood Glucose checks as needed.  Exercise Prescription Changes:   Exercise Prescription Changes     Row Name 09/29/21 1300 10/22/21 0700 11/03/21 0900 11/17/21 1400       Response to Exercise   Blood Pressure (Admit) 126/64 142/80 106/64 132/62    Blood Pressure (Exercise) 152/70 160/80 148/68 128/64    Blood Pressure (Exit) 162/70  rck 142/74 130/78 120/70 126/72    Heart Rate (Admit) 66 bpm 64 bpm 67 bpm 74 bpm    Heart Rate (Exercise) 93 bpm 112 bpm 93 bpm 110 bpm    Heart Rate (Exit) 68 bpm 81 bpm 75 bpm 77 bpm    Oxygen Saturation (Admit) 95 % 97 % 97 % 90 %    Oxygen Saturation (Exercise) 75 % 88 % 89 % 91 %    Oxygen Saturation (Exit) 92 % 94 % 95 % 95 %    Rating of Perceived Exertion (Exercise) _0 Perceived Dyspnea (Exercise) _1 Symptoms SOB, desaturation SOB SOB SOB    Comments walk test results 1st full day of exercise -- --    Duration -- Progress to 30 minutes of  aerobic without signs/symptoms of physical distress Continue with 30 min of aerobic exercise without signs/symptoms of physical  distress. Continue with 30 min of aerobic exercise without signs/symptoms of physical distress.    Intensity -- THRR unchanged THRR unchanged THRR unchanged      Progression   Progression -- Continue to progress workloads to maintain intensity without signs/symptoms of physical distress. Continue to progress workloads to maintain intensity without signs/symptoms of physical distress. Continue to progress workloads to maintain intensity without signs/symptoms of physical distress.    Average METs -- 2.16 2.69 2.12      Resistance Training   Training Prescription -- Yes Yes Yes    Weight -- 3 lb 3 lb 3 lb    Reps -- 10-15 10-15 10-15      Interval Training   Interval Training -- No No No      Oxygen   Oxygen -- Continuous Continuous Continuous    Liters -- _0 NuStep   Level -- 1 2 --    Minutes -- 15  15 --    METs -- 2.8 3.3 --      Arm Ergometer   Level -- 1 1 --    Minutes -- 15 15 --    METs -- 2.3 2.17 --      REL-XR   Level -- _1 Minutes -- _2 METs -- 2.8 2.8 2.5      Biostep-RELP   Level -- _3 Minutes -- _4 METs -- _5 Track   Laps -- _6 Minutes -- _7 METs -- 1.54 1.87 1.87      Oxygen   Maintain Oxygen Saturation -- 88% or higher -- 88% or higher             Exercise Comments:   Exercise Goals and Review:   Exercise Goals     Row Name 09/29/21 1348             Exercise Goals   Increase Physical Activity Yes       Intervention Provide advice, education, support and counseling about physical activity/exercise needs.;Develop an individualized exercise prescription for aerobic and resistive training based on initial evaluation findings, risk stratification, comorbidities and participant's personal goals.       Expected Outcomes Short Term: Attend rehab on a regular basis to increase amount of physical activity.;Long Term: Add in home exercise to make exercise part of routine and to increase amount of physical activity.;Long Term: Exercising regularly at least 3-5 days a week.       Increase Strength and Stamina Yes       Intervention Provide advice, education, support and counseling about physical activity/exercise needs.;Develop an individualized exercise prescription for aerobic and resistive training based on initial evaluation findings, risk stratification, comorbidities and participant's personal goals.       Expected Outcomes Short Term: Increase workloads from initial exercise prescription for resistance, speed, and METs.;Short Term: Perform resistance training exercises routinely during rehab and add in resistance training at home;Long Term: Improve cardiorespiratory fitness, muscular endurance and strength as measured by increased METs and functional capacity (6MWT)       Able to understand and  use rate of perceived exertion (RPE) scale Yes       Intervention Provide education and explanation on how to use RPE scale       Expected Outcomes  Short Term: Able to use RPE daily in rehab to express subjective intensity level       Able to understand and use Dyspnea scale Yes       Intervention Provide education and explanation on how to use Dyspnea scale       Expected Outcomes Short Term: Able to use Dyspnea scale daily in rehab to express subjective sense of shortness of breath during exertion;Long Term: Able to use Dyspnea scale to guide intensity level when exercising independently       Knowledge and understanding of Target Heart Rate Range (THRR) Yes       Intervention Provide education and explanation of THRR including how the numbers were predicted and where they are located for reference       Expected Outcomes Short Term: Able to state/look up THRR;Short Term: Able to use daily as guideline for intensity in rehab;Long Term: Able to use THRR to govern intensity when exercising independently       Able to check pulse independently Yes       Intervention Provide education and demonstration on how to check pulse in carotid and radial arteries.;Review the importance of being able to check your own pulse for safety during independent exercise       Expected Outcomes Long Term: Able to check pulse independently and accurately;Short Term: Able to explain why pulse checking is important during independent exercise       Understanding of Exercise Prescription Yes       Intervention Provide education, explanation, and written materials on patient's individual exercise prescription       Expected Outcomes Short Term: Able to explain program exercise prescription;Long Term: Able to explain home exercise prescription to exercise independently                Exercise Goals Re-Evaluation :  Exercise Goals Re-Evaluation     Row Name 10/13/21 1107 10/22/21 0723 10/27/21 1117 11/03/21 0920  11/12/21 1120     Exercise Goal Re-Evaluation   Exercise Goals Review Increase Physical Activity;Able to understand and use rate of perceived exertion (RPE) scale;Knowledge and understanding of Target Heart Rate Range (THRR);Understanding of Exercise Prescription;Increase Strength and Stamina;Able to check pulse independently;Able to understand and use Dyspnea scale Increase Physical Activity;Increase Strength and Stamina;Understanding of Exercise Prescription Increase Physical Activity;Increase Strength and Stamina;Understanding of Exercise Prescription Increase Physical Activity;Increase Strength and Stamina;Understanding of Exercise Prescription Increase Physical Activity;Increase Strength and Stamina;Understanding of Exercise Prescription   Comments Reviewed RPE and dyspnea scales, THR and program prescription with pt today.  Pt voiced understanding and was given a copy of goals to take home. Brian Casey is off to a good start in rehab. He had an average overall MET level of 2.16 METs. He did well with the biostep on level 2 as well. He also got 10 laps on the track, so we will tell him to push for more. We will continue to monitor his progress in the program. Brian Casey is doing some walking at home, but nothing structured too much. He does not have an O2 oxygen prescription for home yet. He is still talking with doctor about it and has an appointment with his Pulmonologist on 8/15. EP will hold off on going over home exercise until the oxygen prescription is established. We talked about having him use his own pulse ox to check during his activity at home, stop/rest/PLB when needed and when oxygen is below 88%. He plans to keep walking at home for his home exercise  plan. Brian Casey is doing well in rehab. He does desat sometimes during his exercise, and is now using up to 4L of O2 some sessions. He stays above 88%. We have contacted his doctor about getting an oxygen prescription update.  He was able to complete 16 laps on  the track and increased to level 2 on the T4.  We will continue to monitor. Brian Casey is doing well in rehab. He has experienced a lot of SOB with exercise which he is frustrated with. He is walking at rehab but has to stop frequently to get his O2 saturation to rise back to normal levels. He is also doing seated equipment and doing well with it. 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 We will continue to monitor his progress in the program.   Expected Outcomes Short: Use RPE daily to regulate intensity. Long: Follow program prescription in THR. Short: Continue to push for more laps on the track. Long: Continue to increase strength and stamina. Short: EP to go over home exercise, check O2 at home diligently Long: Exercise independently at home at appropriate prescription Short: Continue to watch O2 closely, increase laps on track if O2 allows Long: Continue to increase overall MET level Short: Continue to watch O2 closely, increase laps on track if O2 allows Long: Continue to increase overall MET level    Row Name 11/17/21 1359             Exercise Goal Re-Evaluation   Exercise Goals Review Increase Physical Activity;Increase Strength and Stamina;Understanding of Exercise Prescription       Comments Brian Casey is doing well in rehab.  He is now on home oxgen therapy for activity!  He did request to hold off on home exercise review until her gets used to home oxygen and gets through a few more appoinments.  He did bring his oxygen in with him last week, but would forget to turn it back to come into class after rest in waiting area.  He was encouraged to keep in mind to turn it back on anytime he is moving.  He is holding at 16 laps and still needs to rest after two to three laps.  We will conitnue to monitor his progress.       Expected Outcomes Short: Continue to use O2 more at home, review home exercise guidelines Long:  Continue to improve stamina                Discharge Exercise Prescription (Final Exercise Prescription Changes):  Exercise Prescription Changes - 11/17/21 1400       Response to Exercise   Blood Pressure (Admit) 132/62    Blood Pressure (Exercise) 128/64    Blood Pressure (Exit) 126/72    Heart Rate (Admit) 74 bpm    Heart Rate (Exercise) 110 bpm    Heart Rate (Exit) 77 bpm    Oxygen Saturation (Admit) 90 %    Oxygen Saturation (Exercise) 91 %    Oxygen Saturation (Exit) 95 %    Rating of Perceived Exertion (Exercise) 13    Perceived Dyspnea (Exercise) 3    Symptoms SOB    Duration Continue with 30 min of aerobic exercise without signs/symptoms of physical distress.    Intensity THRR unchanged      Progression   Progression Continue to progress workloads to maintain intensity without signs/symptoms of physical distress.  Average METs 2.12      Resistance Training   Training Prescription Yes    Weight 3 lb    Reps 10-15      Interval Training   Interval Training No      Oxygen   Oxygen Continuous    Liters 4      REL-XR   Level 2    Minutes 15    METs 2.5      Biostep-RELP   Level 2    Minutes 15    METs 2      Track   Laps 16    Minutes 15    METs 1.87      Oxygen   Maintain Oxygen Saturation 88% or higher             Nutrition:  Target Goals: Understanding of nutrition guidelines, daily intake of sodium <1538m, cholesterol <2038m calories 30% from fat and 7% or less from saturated fats, daily to have 5 or more servings of fruits and vegetables.  Education: All About Nutrition: -Group instruction provided by verbal, written material, interactive activities, discussions, models, and posters to present general guidelines for heart healthy nutrition including fat, fiber, MyPlate, the role of sodium in heart healthy nutrition, utilization of the nutrition label, and utilization of this knowledge for meal planning. Follow up email sent as well.  Written material given at graduation. [facility]  Education need identified 09/29/21  Date 10/15/21  Educator [facility] Review Code 1- Verbalizes Understanding       Biometrics:  Pre Biometrics - 09/29/21 1348       Pre Biometrics   Height 5' 7.6" (1.717 m)    Weight 206 lb (93.4 kg)    BMI (Calculated) 31.7    Single Leg Stand 2.8 seconds              Nutrition Therapy Plan and Nutrition Goals:  Nutrition Therapy & Goals - 11/03/21 1209       Nutrition Therapy   RD appointment deferred Yes   LaDerrenould like to defer a nutrition appt. due to his schedule and having many appointments coming up. Will continue to follow up.     Personal Nutrition Goals   Nutrition Goal LaGurjitould like to defer a nutrition appt. due to his schedule and having many appointments coming up. Will continue to follow up.             Nutrition Assessments:  MEDIFICTS Score Key: ?70 Need to make dietary changes  40-70 Heart Healthy Diet ? 40 Therapeutic Level Cholesterol Diet  [facility]  Picture Your Plate Total Score on Admission 50      Picture Your Plate Scores: <4<34nhealthy dietary pattern with much room for improvement. 41-50 Dietary pattern unlikely to meet recommendations for good health and room for improvement. 51-60 More healthful dietary pattern, with some room for improvement.  >60 Healthy dietary pattern, although there may be some specific behaviors that could be improved.   Nutrition Goals Re-Evaluation:  Nutrition Goals Re-Evaluation     [place] 10/27/21 1336             Goals   Comment LaArmonieas yet to meet with the RD.       Expected Outcome Short: Make appt with RD and attend Long: Follow RD goals long-term  Nutrition Goals Discharge (Final Nutrition Goals Re-Evaluation):  Nutrition  Goals Re-Evaluation - 10/27/21 1336       Goals   Comment Stokes has yet to meet with the RD.    Expected Outcome Short: Make appt with RD and attend Long: Follow RD goals long-term             Psychosocial: Target Goals: Acknowledge presence or absence of significant depression and/or stress, maximize coping skills, provide positive support system. Participant is able to verbalize types and ability to use techniques and skills needed for reducing stress and depression.   Education: Stress, Anxiety, and Depression - Group verbal and visual presentation to define topics covered.  Reviews how body is impacted by stress, anxiety, and depression.  Also discusses healthy ways to reduce stress and to treat/manage anxiety and depression.  Written material given at graduation. Flowsheet Row Cardiac Rehab from 04/01/2016 in [facility] Cardiac and Pulmonary Rehab  Date 04/01/16  Educator [facility]  Instruction Review Code (retired) 2- meets goals/outcomes       Education: Sleep Hygiene -Provides group verbal and written instruction about how sleep can affect your health.  Define sleep hygiene, discuss sleep cycles and impact of sleep habits. Review good sleep hygiene tips.    Initial Review & Psychosocial Screening:  Initial Psych Review & Screening - 09/12/21 1421       Initial Review   Current issues with Current Sleep Concerns;Current Psychotropic Meds;Current Stress Concerns    Source of Stress Concerns Chronic Illness;Unable to participate in former interests or hobbies    Comments THis illness is not me. I can not do what I used to do in the past. I am stopped now from participating in what I used to do.      Family Dynamics   Good Support System? Yes   wife, children daughter and son     Barriers   Psychosocial barriers to participate in program There are no identifiable barriers or psychosocial needs.      Screening Interventions   Interventions Encouraged to exercise    Expected  Outcomes Short Term goal: Utilizing psychosocial counselor, staff and physician to assist with identification of specific Stressors or current issues interfering with healing process. Setting desired goal for each stressor or current issue identified.;Long Term Goal: Stressors or current issues are controlled or eliminated.;Short Term goal: Identification and review with participant of any Quality of Life or Depression concerns found by scoring the questionnaire.;Long Term goal: The participant improves quality of Life and PHQ9 Scores as seen by post scores and/or verbalization of changes             Quality of Life Scores:  Scores of 19 and below usually indicate a poorer quality of life in these areas.  A difference of  2-3 points is a clinically meaningful difference.  A difference of 2-3 points in the total score of the Quality of Life Index has been associated with significant improvement in overall quality of life, self-image, physical symptoms, and general health in studies assessing change in quality of life.  PHQ-9: Review Flowsheet       09/29/2021 03/25/2016 02/10/2016  Depression screen PHQ 2/9  Decreased Interest 0 0 1  Down, Depressed, Hopeless 1 0 0  PHQ - 2 Score 1 0 1  Altered sleeping 3 1 0  Tired, decreased energy 2 0 0  Change in appetite 1 0 1  Feeling bad or failure about yourself  1 0 1  Trouble concentrating  0 0 0  Moving slowly or fidgety/restless 0 0 0  Suicidal thoughts 0 0 0  PHQ-9 Score _0 Difficult doing work/chores Somewhat difficult Not difficult at all Not difficult at all   Interpretation of Total Score  Total Score Depression Severity:  1-4 = Minimal depression, 5-9 = Mild depression, 10-14 = Moderate depression, 15-19 = Moderately severe depression, 20-27 = Severe depression   Psychosocial Evaluation and Intervention:  Psychosocial Evaluation - 09/12/21 1428       Psychosocial Evaluation & Interventions   Interventions Encouraged to  exercise with the program and follow exercise prescription    Comments Brian Casey has no barriers to attending the program. He lives with his wife and she is his "advocate". She is with him at all his appointments. THey have a daughter and son that are support too.  Corliss wants to be able to be more active with less shortness of breath. He is streeesd some over the fact he is not able to participate in activities that he enjoys.  He is ready to get started with the program.    Expected Outcomes STG Maycol is able to attend all scheduled sessions, he is able to learn and use breathing techniques and mange his pulmonary meds to help decrease his symptoms. He has been in the Cardiac Rehab program in the past. He has quit tobacco, 2017, and has no desire to start using any form of tobacco.  He is ready to get started.    Continue Psychosocial Services  Follow up required by staff             Psychosocial Re-Evaluation:  Psychosocial Re-Evaluation     [place] Name 10/27/21 1123 11/12/21 1129           Psychosocial Re-Evaluation   Current issues with Current Sleep Concerns Current Sleep Concerns      Comments Brian Casey's sleep is all over the place. He sometimes gets 3 hours, sometimes 8. His doctor put him on prednisone since May- states his doctor told him he needs to take it and doesn't really have any other options for sleep alternatives. He takes Xanax before bed which he feels helps him somewhat but still frustrated he doesn't get consistent sleep. Really encouraged him to follow up with his doctor again who he sees on the 15th to let them know how much his sleep is affected by the prednisone. His wife is good support and goes to him with his appointments. He hopes to see a better improvement with his symptoms the more he is in rehab. He is very motivated to do whatever it takes to manage his ILD. Brian Casey denies any major stressors at this time. He expresses some frustration with his oxygen and his current  treatments. He states that his sleep has improved to 5-6 hours a night since coming off prednisone. He is still taking xanax before bed which he states has helped with his sleep. He feels that his wife has been a great support for him and is very encouraging.      Expected Outcomes Short: Talk to doctor about prednisone and sleep Long: Continue to maintain posititve attitude Short: continue to rely on wife for support Long: Continue to maintain posititve outlook      Interventions Encouraged to attend Pulmonary Rehabilitation for the exercise Encouraged to attend Pulmonary Rehabilitation for the exercise      Continue Psychosocial Services  Follow up required by staff Follow up required by staff  Psychosocial Discharge (Final Psychosocial Re-Evaluation):  Psychosocial Re-Evaluation - 11/12/21 1129       Psychosocial Re-Evaluation   Current issues with Current Sleep Concerns    Comments Brian Casey denies any major stressors at this time. He expresses some frustration with his oxygen and his current treatments. He states that his sleep has improved to 5-6 hours a night since coming off prednisone. He is still taking xanax before bed which he states has helped with his sleep. He feels that his wife has been a great support for him and is very encouraging.    Expected Outcomes Short: continue to rely on wife for support Long: Continue to maintain posititve outlook    Interventions Encouraged to attend Pulmonary Rehabilitation for the exercise    Continue Psychosocial Services  Follow up required by staff             Education: Education Goals: Education classes will be provided on a weekly basis, covering required topics. Participant will state understanding/return demonstration of topics presented.  Learning Barriers/Preferences:   General Pulmonary Education Topics:  Infection Prevention: - Provides verbal and written material to individual with discussion of infection  control including proper hand washing and proper equipment cleaning during exercise session. Flowsheet Row Pulmonary Rehab from 11/06/2021 in [facility] Cardiac and Pulmonary Rehab  Date 09/29/21  Educator [facility]  Instruction Review Code 1- Verbalizes Understanding       Falls Prevention: - Provides verbal and written material to individual with discussion of falls prevention and safety. Flowsheet Row Pulmonary Rehab from 11/06/2021 in [facility] Cardiac and Pulmonary Rehab  Date 09/12/21  Educator SB  Instruction Review Code 1- Verbalizes Understanding       Chronic Lung Disease Review: - Group verbal instruction with posters, models, PowerPoint presentations and videos,  to review new updates, new respiratory medications, new advancements in procedures and treatments. Providing information on websites and "800" numbers for continued self-education. Includes information about supplement oxygen, available portable oxygen systems, continuous and intermittent flow rates, oxygen safety, concentrators, and Medicare reimbursement for oxygen. Explanation of Pulmonary Drugs, including class, frequency, complications, importance of spacers, rinsing mouth after steroid MDI's, and proper cleaning methods for nebulizers. Review of basic lung anatomy and physiology related to function, structure, and complications of lung disease. Review of risk factors. Discussion about methods for diagnosing sleep apnea and types of masks and machines for OSA. Includes a review of the use of types of environmental controls: home humidity, furnaces, filters, dust mite/pet prevention, HEPA vacuums. Discussion about weather changes, air quality and the benefits of nasal washing. Instruction on Warning signs, infection symptoms, calling MD promptly, preventive modes, and value of vaccinations. Review of effective airway clearance, coughing and/or vibration techniques. Emphasizing that all should Create an Action Plan. Written material given at  graduation. Flowsheet Row Pulmonary Rehab from 11/06/2021 in [facility] Cardiac and Pulmonary Rehab  Education need identified 09/29/21  Date 11/06/21  Educator [facility]  Instruction Review Code 1- Verbalizes Understanding       AED/CPR: - Group verbal and written instruction with the use of models to demonstrate the basic use of the AED with the basic ABC's of resuscitation.    Anatomy and Cardiac Procedures: - Group verbal and visual presentation and models provide information about basic cardiac anatomy and function. Reviews the testing methods done to diagnose heart disease and the outcomes of the test results. Describes the treatment choices: Medical Management, Angioplasty, or Coronary Bypass Surgery for treating various heart conditions including Myocardial Infarction, Angina,  Valve Disease, and Cardiac Arrhythmias.  Written material given at graduation. Flowsheet Row Cardiac Rehab from 04/01/2016 in [facility] Cardiac and Pulmonary Rehab  Date 02/24/16  Educator TS  Instruction Review Code (retired) 2- meets goals/outcomes       Medication Safety: - Group verbal and visual instruction to review commonly prescribed medications for heart and lung disease. Reviews the medication, class of the drug, and side effects. Includes the steps to properly store meds and maintain the prescription regimen.  Written material given at graduation. Flowsheet Row Cardiac Rehab from 04/01/2016 in [facility] Cardiac and Pulmonary Rehab  Date 03/11/16  Marisue Humble 2]  Educator TS  Instruction Review Code (retired) 2- meets goals/outcomes       Other: -Provides group and verbal instruction on various topics (see comments)   Knowledge Questionnaire Score:  Knowledge Questionnaire Score - 09/29/21 1349       Knowledge Questionnaire Score   Pre Score 16/18              Core Components/Risk Factors/Patient Goals at Admission:  Personal Goals and Risk Factors at Admission - 09/29/21 1349       Core  Components/Risk Factors/Patient Goals on Admission    Weight Management Yes;Weight Loss;Obesity    Intervention Weight Management: Develop a combined nutrition and exercise program designed to reach desired caloric intake, while maintaining appropriate intake of nutrient and fiber, sodium and fats, and appropriate energy expenditure required for the weight goal.;Weight Management: Provide education and appropriate resources to help participant work on and attain dietary goals.;Weight Management/Obesity: Establish reasonable short term and long term weight goals.;Obesity: Provide education and appropriate resources to help participant work on and attain dietary goals.    Admit Weight 206 lb (93.4 kg)    Goal Weight: Short Term 200 lb (90.7 kg)    Goal Weight: Long Term 195 lb (88.5 kg)    Expected Outcomes Short Term: Continue to assess and modify interventions until short term weight is achieved;Weight Loss: Understanding of general recommendations for a balanced deficit meal plan, which promotes 1-2 lb weight loss per week and includes a negative energy balance of [phone] kcal/d;Understanding recommendations for meals to include 15-35% energy as protein, 25-35% energy from fat, 35-60% energy from carbohydrates, less than 272m of dietary cholesterol, 20-35 gm of total fiber daily;Understanding of distribution of calorie intake throughout the day with the consumption of 4-5 meals/snacks;Long Term: Adherence to nutrition and physical activity/exercise program aimed toward attainment of established weight goal    Improve shortness of breath with ADL's Yes    Intervention Provide education, individualized exercise plan and daily activity instruction to help decrease symptoms of SOB with activities of daily living.    Expected Outcomes Short Term: Improve cardiorespiratory fitness to achieve a reduction of symptoms when performing ADLs;Long Term: Be able to perform more ADLs without symptoms or delay the onset  of symptoms    Increase knowledge of respiratory medications and ability to use respiratory devices properly  Yes    Intervention Provide education and demonstration as needed of appropriate use of medications, inhalers, and oxygen therapy.    Expected Outcomes Short Term: Achieves understanding of medications use. Understands that oxygen is a medication prescribed by physician. Demonstrates appropriate use of inhaler and oxygen therapy.;Long Term: Maintain appropriate use of medications, inhalers, and oxygen therapy.    Diabetes Yes    Intervention Provide education about signs/symptoms and action to take for hypo/hyperglycemia.;Provide education about proper nutrition, including hydration, and aerobic/resistive exercise prescription along with  prescribed medications to achieve blood glucose in normal ranges: Fasting glucose 65-99 mg/dL    Expected Outcomes Short Term: Participant verbalizes understanding of the signs/symptoms and immediate care of hyper/hypoglycemia, proper foot care and importance of medication, aerobic/resistive exercise and nutrition plan for blood glucose control.;Long Term: Attainment of HbA1C < 7%.    Hypertension Yes    Intervention Provide education on lifestyle modifcations including regular physical activity/exercise, weight management, moderate sodium restriction and increased consumption of fresh fruit, vegetables, and low fat dairy, alcohol moderation, and smoking cessation.;Monitor prescription use compliance.    Expected Outcomes Short Term: Continued assessment and intervention until BP is < 140/71m HG in hypertensive participants. < 130/847mHG in hypertensive participants with diabetes, heart failure or chronic kidney disease.;Long Term: Maintenance of blood pressure at goal levels.    Lipids Yes    Intervention Provide education and support for participant on nutrition & aerobic/resistive exercise along with prescribed medications to achieve LDL <7026mHDL >63m80m   Expected Outcomes Short Term: Participant states understanding of desired cholesterol values and is compliant with medications prescribed. Participant is following exercise prescription and nutrition guidelines.;Long Term: Cholesterol controlled with medications as prescribed, with individualized exercise RX and with personalized nutrition plan. Value goals: LDL < 70mg26mL > 40 mg.             Education:Diabetes - Individual verbal and written instruction to review signs/symptoms of diabetes, desired ranges of glucose level fasting, after meals and with exercise. Acknowledge that pre and post exercise glucose checks will be done for 3 sessions at entry of program. Flowsheet [facility]  Date 09/29/21  Educator [facility] Review Code 1- Verbalizes Understanding       Know Your Numbers and Heart Failure: - Group verbal and visual instruction to discuss disease risk factors for cardiac and pulmonary disease and treatment options.  Reviews associated critical values for Overweight/Obesity, Hypertension, Cholesterol, and Diabetes.  Discusses basics of heart failure: signs/symptoms and treatments.  Introduces Heart Failure Zone chart for action plan for heart failure.  Written material given at graduation. Flowsheet Row [facility]  Date 10/29/21  Educator SB  Instruction Review Code 1- Verbalizes Understanding       Core Components/Risk Factors/Patient Goals Review:   Goals and Risk Factor Review     Row Name 10/27/21 1129 11/12/21 1134           Core Components/Risk Factors/Patient Goals Review   Personal Goals Review Diabetes;Improve shortness of breath with ADL's;Weight Management/Obesity;Hypertension Diabetes;Improve shortness of breath with ADL's;Weight Management/Obesity;Hypertension      Review LarryMorrishecking his blood sugar, its ranging around 120 or less  in the morning. His BP at home ranges 130 [id number]. His BP is running higher than normal because he has been on prednisone for a while. Because of that, his weight has increased to 204 lb. He used to be in the 190s. He is going to check with his doctor to see if there is something else he can take. He checks weight  about every other day. He does  not feel so much difference in SOB since rehab but only started a little while ago. He is still getting used to his oxygen during exercise. LarryJospehes that his blood sugars are under control and his doctor said that he does not need medication for diabetes at this time. LarryDayshawnhecking  his BP at home every day and states that he is staying around 130/70. Brian Casey states that he has gained some weight while on prednisone and would like to lose down to 190 lbs. He is still experiencing SOB and is getting used to his oxygen during exercise.      Expected Outcomes Short: Check with doctor on prednisone Long: Continue to manage lifestyel risk factors Short: Continue to check blood sugar and BP levels at home. Long: Continue to manage risk factors.               Core Components/Risk Factors/Patient Goals at Discharge (Final Review):   Goals and Risk Factor Review - 11/12/21 1134       Core Components/Risk Factors/Patient Goals Review   Personal Goals Review Diabetes;Improve shortness of breath with ADL's;Weight Management/Obesity;Hypertension    Review Brian Casey states that his blood sugars are under control and his doctor said that he does not need medication for diabetes at this time. Brian Casey is checking his BP at home every day and states that he is staying around 130/70. Brian Casey states that he has gained some weight while on prednisone and would like to lose down to 190 lbs. He is still experiencing SOB and is getting used to his oxygen during exercise.    Expected Outcomes Short: Continue to check blood sugar and BP levels at home. Long: Continue to  manage risk factors.             ITP Comments:  ITP Comments     Row Name 09/12/21 1441 09/29/21 1158 10/01/21 1029 10/13/21 1106 10/28/21 1552   ITP Comments Virtual orientation call completed today. he has an appointment on Date: 09/29/2021  for EP eval and gym Orientation.  Documentation of diagnosis can be found in [facility] Date: 09/05/2021 . Completed 6MWT and gym orientation. Initial ITP created and sent for review to Dr. Zetta Bills, Medical Director. 30 Day review completed. Medical Director ITP review done, changes made as directed, and signed approval by Medical Director.   NEW First full day of exercise!  Patient was oriented to gym and equipment including functions, settings, policies, and procedures.  Patient's individual exercise prescription and treatment plan were reviewed.  All starting workloads were established based on the results of the 6 minute walk test done at initial orientation visit.  The plan for exercise progression was also introduced and progression will be customized based on patient's performance and goals. Sent note to Dr. Chase Caller regarding patient to get home oxygen therapy. Patient currently has rehab oxygen prescription, but still waiting for him to get some for home. Encouraged him to check O2 saturations at home with pulse ox. Patient to see Dr. Chase Caller on 8/15 but hope to hear back on prescription ASAP.    [place] Name 10/29/21 0954 11/26/21 1343         ITP Comments 30 Day review completed. Medical Director ITP review done, changes made as directed, and signed approval by Medical Director. 30 Day review completed. Medical Director ITP review done, changes made as directed, and signed approval by Medical Director.               Comments:

## 2021-11-27 NOTE — Telephone Encounter (Signed)
Provider form for [facility] cares received. Placed in PAP pending info folder. Rx for Dickey Gave has already been sent to [facility].  Knox Saliva, PharmD, MPH, BCPS, CPP Clinical Pharmacist (Rheumatology and Pulmonology)

## 2021-11-28 ENCOUNTER — Other Ambulatory Visit: Payer: Self-pay | Admitting: Surgery

## 2021-11-28 ENCOUNTER — Encounter: Payer: No Typology Code available for payment source | Admitting: *Deleted

## 2021-11-28 ENCOUNTER — Encounter: Payer: No Typology Code available for payment source | Admitting: Thoracic Surgery (Cardiothoracic Vascular Surgery)

## 2021-11-28 DIAGNOSIS — J849 Interstitial pulmonary disease, unspecified: Secondary | ICD-10-CM

## 2021-11-28 DIAGNOSIS — R1033 Periumbilical pain: Secondary | ICD-10-CM

## 2021-11-28 NOTE — Progress Notes (Signed)
Daily Session Note  Patient Details  Name: EDILBERTO ROOSEVELT MRN: [id number] Date of Birth: 1951-02-03 Referring Provider:   April Manson Pulmonary Rehab from 09/29/2021 in [facility] Cardiac and Pulmonary Rehab  Referring Provider Cyndie Chime MD ([place])  [facility] Pulmonologist Dr. Lanney Gins       Encounter Date: 11/28/2021  Check In:  Session Check In - 11/28/21 1146       Check-In   Supervising physician immediately available to respond to emergencies See telemetry face sheet for immediately available ER MD    Location [facility]-Cardiac & Pulmonary Rehab    Staff Present Heath Lark, RN, BSN, CCRP;Joseph [place], RCP,RRT,BSRT;Jessica [place], [place], RCEP, CCRP, CCET    Virtual Visit No    Medication changes reported     No    Fall or balance concerns reported    No    Warm-up and Cool-down Performed on first and last piece of equipment    Resistance Training Performed Yes    VAD Patient? No    PAD/SET Patient? No      Pain Assessment   Currently in Pain? No/denies                Social History   Tobacco Use  Smoking Status Former   Packs/day: 1.00   Years: 48.00   Total pack years: 48.00   Types: Cigarettes   Quit date: 11/07/2015   Years since quitting: 6.0  Smokeless Tobacco Never    Goals Met:  Proper associated with RPD/PD & O2 Sat Independence with exercise equipment Exercise tolerated well No report of concerns or symptoms today  Goals Unmet:  Not Applicable  Comments: Pt able to follow exercise prescription today without complaint.  Will continue to monitor for progression.    Dr. Emily Filbert is Medical Director for [place].  Dr. Ottie Glazier is Medical Director for [facility] Pulmonary Rehabilitation.

## 2021-12-01 ENCOUNTER — Encounter: Payer: No Typology Code available for payment source | Admitting: *Deleted

## 2021-12-01 DIAGNOSIS — J849 Interstitial pulmonary disease, unspecified: Secondary | ICD-10-CM

## 2021-12-01 NOTE — Progress Notes (Signed)
Daily Session Note  Patient Details  Name: Brian Casey MRN: [id number] Date of Birth: 08-12-1950 Referring Provider:   April Manson Pulmonary Rehab from 09/29/2021 in [facility] Cardiac and Pulmonary Rehab  Referring Provider Cyndie Chime MD (VA)  [facility] Pulmonologist Dr. Lanney Gins       Encounter Date: 12/01/2021  Check In:  Session Check In - 12/01/21 1113       Check-In   Supervising physician immediately available to respond to emergencies See telemetry face sheet for immediately available ER MD    Location [facility]-Cardiac & Pulmonary Rehab    Staff Present Renita Papa, RN Moises Blood, BS, ACSM CEP, Exercise Physiologist;Kara Eliezer Bottom, MS, ASCM CEP, Exercise Physiologist;Noah Tickle, BS, Exercise Physiologist    Virtual Visit No    Medication changes reported     No    Fall or balance concerns reported    No    Warm-up and Cool-down Performed on first and last piece of equipment    Resistance Training Performed Yes    VAD Patient? No    PAD/SET Patient? No      Pain Assessment   Currently in Pain? No/denies                Social History   Tobacco Use  Smoking Status Former   Packs/day: 1.00   Years: 48.00   Total pack years: 48.00   Types: Cigarettes   Quit date: 11/07/2015   Years since quitting: 6.0  Smokeless Tobacco Never    Goals Met:  Independence with exercise equipment Exercise tolerated well No report of concerns or symptoms today Strength training completed today  Goals Unmet:  Not Applicable  Comments: Pt able to follow exercise prescription today without complaint.  Will continue to monitor for progression.    Dr. Emily Filbert is Medical Director for [place].  Dr. Ottie Glazier is Medical Director for [facility] Pulmonary Rehabilitation.

## 2021-12-02 ENCOUNTER — Telehealth: Payer: Self-pay | Admitting: Internal Medicine

## 2021-12-03 ENCOUNTER — Encounter: Payer: No Typology Code available for payment source | Admitting: *Deleted

## 2021-12-03 DIAGNOSIS — J849 Interstitial pulmonary disease, unspecified: Secondary | ICD-10-CM | POA: Diagnosis not present

## 2021-12-03 NOTE — Progress Notes (Signed)
Daily Session Note  Patient Details  Name: Brian Casey MRN: [id number] Date of Birth: Nov 07, 1950 Referring Provider:   April Manson Pulmonary Rehab from 09/29/2021 in [facility] Cardiac and Pulmonary Rehab  Referring Provider Cyndie Chime MD (VA)  [facility] Pulmonologist Dr. Lanney Gins       Encounter Date: 12/03/2021  Check In:  Session Check In - 12/03/21 1152       Check-In   Supervising physician immediately available to respond to emergencies See telemetry face sheet for immediately available ER MD    Location [facility]-Cardiac & Pulmonary Rehab    Staff Present Heath Lark, RN, BSN, CCRP;Noah Tickle, BS, Exercise Physiologist;Meredith Sherryll Burger, RN BSN;Joseph Ecru, RCP,RRT,BSRT   Kathee Delton BS, Exercise Physiologist   Darlyne Russian RN [place]   Virtual Visit No    Medication changes reported     No    Fall or balance concerns reported    No    Warm-up and Cool-down Performed on first and last piece of equipment    Resistance Training Performed Yes    VAD Patient? No    PAD/SET Patient? No      Pain Assessment   Currently in Pain? No/denies                Social History   Tobacco Use  Smoking Status Former   Packs/day: 1.00   Years: 48.00   Total pack years: 48.00   Types: Cigarettes   Quit date: 11/07/2015   Years since quitting: 6.0  Smokeless Tobacco Never    Goals Met:  Proper associated with RPD/PD & O2 Sat Independence with exercise equipment Exercise tolerated well No report of concerns or symptoms today  Goals Unmet:  Not Applicable  Comments: Pt able to follow exercise prescription today without complaint.  Will continue to monitor for progression.    Dr. Emily Filbert is Medical Director for [place].  Dr. Ottie Glazier is Medical Director for [facility] Pulmonary Rehabilitation.

## 2021-12-05 ENCOUNTER — Encounter: Payer: No Typology Code available for payment source | Admitting: *Deleted

## 2021-12-05 DIAGNOSIS — J849 Interstitial pulmonary disease, unspecified: Secondary | ICD-10-CM | POA: Diagnosis not present

## 2021-12-05 NOTE — Progress Notes (Signed)
Daily Session Note  Patient Details  Name: Brian Casey MRN: [id number] Date of Birth: [birth year] Referring Provider:   April Manson Pulmonary Rehab from 09/29/2021 in [facility] Cardiac and Pulmonary Rehab  Referring Provider Cyndie Chime MD (VA)  [facility] Pulmonologist Dr. Lanney Gins       Encounter Date: 12/05/2021  Check In:  Session Check In - 12/05/21 1121       Check-In   Supervising physician immediately available to respond to emergencies See telemetry face sheet for immediately available ER MD    Location [facility]-Cardiac & Pulmonary Rehab    Staff Present Heath Lark, RN, BSN, CCRP;Joseph Boyd, RCP,RRT,BSRT   Kathee Delton BS, Exercise Physiologist   Virtual Visit No    Medication changes reported     No    Fall or balance concerns reported    No    Warm-up and Cool-down Performed on first and last piece of equipment    Resistance Training Performed Yes    VAD Patient? No    PAD/SET Patient? No      Pain Assessment   Currently in Pain? No/denies                Social History   Tobacco Use  Smoking Status Former   Packs/day: 1.00   Years: 48.00   Total pack years: 48.00   Types: Cigarettes   Quit date: 11/07/2015   Years since quitting: 6.0  Smokeless Tobacco Never    Goals Met:  Proper associated with RPD/PD & O2 Sat Independence with exercise equipment Exercise tolerated well No report of concerns or symptoms today  Goals Unmet:  Not Applicable  Comments: Pt able to follow exercise prescription today without complaint.  Will continue to monitor for progression.    Dr. Emily Filbert is Medical Director for [place].  Dr. Ottie Glazier is Medical Director for [facility] Pulmonary Rehabilitation.

## 2021-12-08 ENCOUNTER — Encounter: Payer: No Typology Code available for payment source | Admitting: *Deleted

## 2021-12-08 DIAGNOSIS — J849 Interstitial pulmonary disease, unspecified: Secondary | ICD-10-CM | POA: Diagnosis not present

## 2021-12-08 NOTE — Telephone Encounter (Signed)
Called patients wife back and she is wondering about when MR wanted patient to get blood work done. Before visit or at office visit?   I have to double check on ONO status with the [place].   Please advise on when you wanted blood work done sir>

## 2021-12-08 NOTE — Progress Notes (Signed)
Daily Session Note  Patient Details  Name: Brian Casey MRN: [id number] Date of Birth: [birth year] Referring Provider:   April Manson Pulmonary Rehab from 09/29/2021 in [facility] Cardiac and Pulmonary Rehab  Referring Provider Cyndie Chime MD (VA)  [facility] Pulmonologist Dr. Lanney Gins       Encounter Date: 12/08/2021  Check In:  Session Check In - 12/08/21 1111       Check-In   Supervising physician immediately available to respond to emergencies See telemetry face sheet for immediately available ER MD    Location [facility]-Cardiac & Pulmonary Rehab    Staff Present Renita Papa, RN Moises Blood, BS, ACSM CEP, Exercise Physiologist;Noah Tickle, BS, Exercise Physiologist    Virtual Visit No    Medication changes reported     No    Fall or balance concerns reported    No    Warm-up and Cool-down Performed on first and last piece of equipment    Resistance Training Performed Yes    VAD Patient? No    PAD/SET Patient? No      Pain Assessment   Currently in Pain? No/denies                Social History   Tobacco Use  Smoking Status Former   Packs/day: 1.00   Years: 48.00   Total pack years: 48.00   Types: Cigarettes   Quit date: 11/07/2015   Years since quitting: 6.0  Smokeless Tobacco Never    Goals Met:  Independence with exercise equipment Exercise tolerated well No report of concerns or symptoms today Strength training completed today  Goals Unmet:  Not Applicable  Comments: Pt able to follow exercise prescription today without complaint.  Will continue to monitor for progression.    Dr. Emily Filbert is Medical Director for [place].  Dr. Ottie Glazier is Medical Director for [facility] Pulmonary Rehabilitation.

## 2021-12-09 ENCOUNTER — Ambulatory Visit
Admission: RE | Admit: 2021-12-09 | Discharge: 2021-12-09 | Disposition: A | Discharge status: Home / Self Care | Payer: No Typology Code available for payment source | Source: Ambulatory Visit | Attending: Surgery | Admitting: Surgery

## 2021-12-09 DIAGNOSIS — R1033 Periumbilical pain: Secondary | ICD-10-CM

## 2021-12-09 MED ORDER — IOPAMIDOL (ISOVUE-300) INJECTION 61%
100.0000 mL | Freq: Once | INTRAVENOUS | Status: AC | PRN
  Administered 2021-12-09: 100 mL via INTRAVENOUS

## 2021-12-11 NOTE — Telephone Encounter (Signed)
I started him on nintedanib so he should get liver function test once a month for 6 months and then every 3 months after that.  It should be from the start date of the nintedanib which I prescribed on 11/20/2021 but I do not know the date he actually started it.  This can be done before or during or after the visit as long as the schedule is.

## 2021-12-12 ENCOUNTER — Encounter: Payer: No Typology Code available for payment source | Admitting: *Deleted

## 2021-12-12 ENCOUNTER — Ambulatory Visit: Payer: No Typology Code available for payment source | Attending: Internal Medicine

## 2021-12-12 DIAGNOSIS — J849 Interstitial pulmonary disease, unspecified: Secondary | ICD-10-CM

## 2021-12-12 LAB — PULMONARY FUNCTION TEST ARMC ONLY
DL/VA % pred: 59 %
DL/VA: 2.45 ml/min/mmHg/L
DLCO cor % pred: 22 %
DLCO cor: 5.38 ml/min/mmHg
DLCO unc % pred: 22 %
DLCO unc: 5.38 ml/min/mmHg
FEF 25-75 Post: 2.36 L/sec
FEF 25-75 Pre: 2 L/sec
FEF2575-%Change-Post: 17 %
FEF2575-%Pred-Post: 104 %
FEF2575-%Pred-Pre: 88 %
FEV1-%Change-Post: 3 %
FEV1-%Pred-Post: 44 %
FEV1-%Pred-Pre: 43 %
FEV1-Post: 1.3 L
FEV1-Pre: 1.26 L
FEV1FVC-%Change-Post: 0 %
FEV1FVC-%Pred-Pre: 120 %
FEV6-%Change-Post: 2 %
FEV6-%Pred-Post: 38 %
FEV6-%Pred-Pre: 37 %
FEV6-Post: 1.45 L
FEV6-Pre: 1.41 L
FEV6FVC-%Pred-Post: 106 %
FEV6FVC-%Pred-Pre: 106 %
FVC-%Change-Post: 3 %
FVC-%Pred-Post: 36 %
FVC-%Pred-Pre: 35 %
FVC-Post: 1.46 L
FVC-Pre: 1.41 L
Post FEV1/FVC ratio: 89 %
Post FEV6/FVC ratio: 100 %
Pre FEV1/FVC ratio: 89 %
Pre FEV6/FVC Ratio: 100 %
RV % pred: 33 %
RV: 0.76 L
TLC % pred: 34 %
TLC: 2.23 L

## 2021-12-12 MED ORDER — ALBUTEROL SULFATE (2.5 MG/3ML) 0.083% IN NEBU
2.5000 mg | INHALATION_SOLUTION | Freq: Once | RESPIRATORY_TRACT | Status: AC
Start: 2021-12-12 — End: 2021-12-12
  Administered 2021-12-12: 2.5 mg via RESPIRATORY_TRACT

## 2021-12-12 NOTE — Progress Notes (Signed)
Daily Session Note  Patient Details  Name: Brian Casey MRN: [id number] Date of Birth: 03-01-1951 Referring Provider:   April Manson Pulmonary Rehab from 09/29/2021 in [facility] Cardiac and Pulmonary Rehab  Referring Provider Cyndie Chime MD (VA)  [facility] Pulmonologist Dr. Lanney Gins       Encounter Date: 12/12/2021  Check In:  Session Check In - 12/12/21 1041       Check-In   Supervising physician immediately available to respond to emergencies See telemetry face sheet for immediately available ER MD    Location [facility]-Cardiac & Pulmonary Rehab    Staff Present Heath Lark, RN, BSN, CCRP;Jessica [place], MA, RCEP, CCRP, CCET;Joseph [place], [place]    Virtual Visit No    Medication changes reported     No    Fall or balance concerns reported    No    Warm-up and Cool-down Performed on first and last piece of equipment    Resistance Training Performed Yes    VAD Patient? No    PAD/SET Patient? No      Pain Assessment   Currently in Pain? No/denies                Social History   Tobacco Use  Smoking Status Former   Packs/day: 1.00   Years: 48.00   Total pack years: 48.00   Types: Cigarettes   Quit date: 11/07/2015   Years since quitting: 6.1  Smokeless Tobacco Never    Goals Met:  Proper associated with RPD/PD & O2 Sat Exercise tolerated well No report of concerns or symptoms today  Goals Unmet:  Not Applicable  Comments: Pt able to follow exercise prescription today without complaint.  Will continue to monitor for progression.    Dr. Emily Filbert is Medical Director for [place].  Dr. Ottie Glazier is Medical Director for [facility] Pulmonary Rehabilitation.

## 2021-12-12 NOTE — Telephone Encounter (Signed)
Called and spoke with pt's spouse Jenny Reichmann letting her know the info from MR and she verbalized understanding. Pt started taking the OFEV last week. Pt has an upcoming appt with MR 10/24 so he can get bloodwork done at that [place] was made aware that pt will need to have bloodwork done once a month for the first 6 months then after that it would be every 3 months.  While speaking with Jenny Reichmann, she stated that they still have not heard about having ONO performed. Routing this to [place] for her to review as I see that she has faxed ONO order to [place]  multiple times.

## 2021-12-12 NOTE — Telephone Encounter (Signed)
I called Dr Hedrick's office and spoke to V[place].  She has the fax I sent.  They had it but nothing was done with it.  She is sending it to nurse to let them know pt needs ono.  She states someone will call the pt.  I called & spoke to pt's wife and made her aware someone from Dr Hedrick's office should contact them.  Nothing further needed.

## 2021-12-15 ENCOUNTER — Encounter: Payer: No Typology Code available for payment source | Admitting: *Deleted

## 2021-12-15 ENCOUNTER — Encounter: Payer: Self-pay | Admitting: Internal Medicine

## 2021-12-15 DIAGNOSIS — J849 Interstitial pulmonary disease, unspecified: Secondary | ICD-10-CM

## 2021-12-15 NOTE — Progress Notes (Signed)
Daily Session Note  Patient Details  Name: Brian Casey MRN: [id number] Date of Birth: 07/27/1950 Referring Provider:   April Manson Pulmonary Rehab from 09/29/2021 in [facility] Cardiac and Pulmonary Rehab  Referring Provider Cyndie Chime MD (VA)  [facility] Pulmonologist Dr. Lanney Gins       Encounter Date: 12/15/2021  Check In:  Session Check In - 12/15/21 1109       Check-In   Supervising physician immediately available to respond to emergencies See telemetry face sheet for immediately available ER MD    Location [facility]-Cardiac & Pulmonary Rehab    Staff Present [name], MA, RCEP, CCRP, [place], BS, ACSM CEP, Exercise Physiologist;Kara Eliezer Bottom, MS, ASCM CEP, Exercise Physiologist;Other   Darlyne Russian, RN   Virtual Visit No    Medication changes reported     No    Fall or balance concerns reported    No    Warm-up and Cool-down Performed on first and last piece of equipment    Resistance Training Performed Yes    VAD Patient? No    PAD/SET Patient? No      Pain Assessment   Currently in Pain? No/denies                Social History   Tobacco Use  Smoking Status Former   Packs/day: 1.00   Years: 48.00   Total pack years: 48.00   Types: Cigarettes   Quit date: 11/07/2015   Years since quitting: 6.1  Smokeless Tobacco Never    Goals Met:  Independence with exercise equipment Exercise tolerated well No report of concerns or symptoms today Strength training completed today  Goals Unmet:  Not Applicable  Comments: Pt able to follow exercise prescription today without complaint.  Will continue to monitor for progression.    Dr. Emily Filbert is Medical Director for [place].  Dr. Ottie Glazier is Medical Director for [facility] Pulmonary Rehabilitation.

## 2021-12-16 MED ORDER — PREDNISONE 10 MG PO TABS: 10.0000 mg | ORAL_TABLET | Freq: Every day | ORAL | 0 refills | Status: DC

## 2021-12-17 ENCOUNTER — Encounter: Payer: No Typology Code available for payment source | Admitting: *Deleted

## 2021-12-17 DIAGNOSIS — J849 Interstitial pulmonary disease, unspecified: Secondary | ICD-10-CM

## 2021-12-17 NOTE — Progress Notes (Signed)
Daily Session Note  Patient Details  Name: Brian Casey MRN: [id number] Date of Birth: [birth year] Referring Provider:   Flowsheet Row Pulmonary Rehab from 09/29/2021 in [facility] Cardiac and Pulmonary Rehab  Referring Provider Cyndie Chime MD (VA)  [facility] Pulmonologist Dr. Lanney Gins       Encounter Date: 12/17/2021  Check In:  Session Check In - 12/17/21 1132       Check-In   Supervising physician immediately available to respond to emergencies See telemetry face sheet for immediately available ER MD    Location [facility]-Cardiac & Pulmonary Rehab    Staff Present Justin Mend, RCP,RRT,BSRT;Noah Tickle, BS, Exercise Physiologist;Other   Darlyne Russian, RN   Virtual Visit No    Medication changes reported     No    Fall or balance concerns reported    No    Warm-up and Cool-down Performed on first and last piece of equipment    Resistance Training Performed Yes    VAD Patient? No    PAD/SET Patient? No      Pain Assessment   Currently in Pain? No/denies                Social History   Tobacco Use  Smoking Status Former   Packs/day: 1.00   Years: 48.00   Total pack years: 48.00   Types: Cigarettes   Quit date: 11/07/2015   Years since quitting: 6.1  Smokeless Tobacco Never    Goals Met:  Independence with exercise equipment Exercise tolerated well No report of concerns or symptoms today Strength training completed today  Goals Unmet:  Not Applicable  Comments: Pt able to follow exercise prescription today without complaint.  Will continue to monitor for progression.    Dr. Emily Filbert is Medical Director for [place].  Dr. Ottie Glazier is Medical Director for [facility] Pulmonary Rehabilitation.

## 2021-12-19 ENCOUNTER — Encounter: Payer: No Typology Code available for payment source | Admitting: *Deleted

## 2021-12-19 DIAGNOSIS — J849 Interstitial pulmonary disease, unspecified: Secondary | ICD-10-CM | POA: Diagnosis not present

## 2021-12-19 NOTE — Progress Notes (Signed)
Daily Session Note  Patient Details  Name: Brian Casey MRN: [id number] Date of Birth: 1950/10/30 Referring Provider:   April Manson Pulmonary Rehab from 09/29/2021 in [facility] Cardiac and Pulmonary Rehab  Referring Provider Cyndie Chime MD (VA)  [facility] Pulmonologist Dr. Lanney Gins       Encounter Date: 12/19/2021  Check In:  Session Check In - 12/19/21 1131       Check-In   Supervising physician immediately available to respond to emergencies See telemetry face sheet for immediately available ER MD    Location [facility]-Cardiac & Pulmonary Rehab    Staff Present Heath Lark, RN, BSN, CCRP;Jessica [place], MA, RCEP, CCRP, CCET;Joseph [place], [place]    Virtual Visit No    Medication changes reported     No    Fall or balance concerns reported    No    Warm-up and Cool-down Performed on first and last piece of equipment    Resistance Training Performed Yes    VAD Patient? No    PAD/SET Patient? No      Pain Assessment   Currently in Pain? No/denies                Social History   Tobacco Use  Smoking Status Former   Packs/day: 1.00   Years: 48.00   Total pack years: 48.00   Types: Cigarettes   Quit date: 11/07/2015   Years since quitting: 6.1  Smokeless Tobacco Never    Goals Met:  Proper associated with RPD/PD & O2 Sat Independence with exercise equipment Exercise tolerated well No report of concerns or symptoms today  Goals Unmet:  Not Applicable  Comments: Pt able to follow exercise prescription today without complaint.  Will continue to monitor for progression.    Dr. Emily Filbert is Medical Director for [place].  Dr. Ottie Glazier is Medical Director for [facility] Pulmonary Rehabilitation.

## 2021-12-22 ENCOUNTER — Encounter: Payer: No Typology Code available for payment source | Attending: Pulmonary Disease | Admitting: *Deleted

## 2021-12-22 DIAGNOSIS — J849 Interstitial pulmonary disease, unspecified: Secondary | ICD-10-CM | POA: Insufficient documentation

## 2021-12-22 NOTE — Progress Notes (Signed)
Daily Session Note  Patient Details  Name: Brian Casey MRN: [id number] Date of Birth: [birth year] Referring Provider:   April Manson Pulmonary Rehab from 09/29/2021 in [facility] Cardiac and Pulmonary Rehab  Referring Provider Cyndie Chime MD (VA)  [facility] Pulmonologist Dr. Lanney Gins       Encounter Date: 12/22/2021  Check In:  Session Check In - 12/22/21 1119       Check-In   Supervising physician immediately available to respond to emergencies See telemetry face sheet for immediately available ER MD    Location [facility]-Cardiac & Pulmonary Rehab    Staff Present Antionette Fairy, BS, Exercise Physiologist;Kelly Amedeo Plenty, BS, ACSM CEP, Exercise Physiologist;Meredith Sherryll Burger, RN Odelia Gage, RN, ADN    Virtual Visit No    Medication changes reported     No    Fall or balance concerns reported    No    Warm-up and Cool-down Performed on first and last piece of equipment    Resistance Training Performed Yes    VAD Patient? No    PAD/SET Patient? No      Pain Assessment   Currently in Pain? No/denies                Social History   Tobacco Use  Smoking Status Former   Packs/day: 1.00   Years: 48.00   Total pack years: 48.00   Types: Cigarettes   Quit date: 11/07/2015   Years since quitting: 6.1  Smokeless Tobacco Never    Goals Met:  Independence with exercise equipment Exercise tolerated well No report of concerns or symptoms today Strength training completed today  Goals Unmet:  Not Applicable  Comments: Pt able to follow exercise prescription today without complaint.  Will continue to monitor for progression.     Dr. Emily Filbert is Medical Director for [place].  Dr. Ottie Glazier is Medical Director for [facility] Pulmonary Rehabilitation.

## 2021-12-24 ENCOUNTER — Encounter: Payer: No Typology Code available for payment source | Admitting: *Deleted

## 2021-12-24 ENCOUNTER — Encounter: Payer: Self-pay | Admitting: *Deleted

## 2021-12-24 DIAGNOSIS — J849 Interstitial pulmonary disease, unspecified: Secondary | ICD-10-CM

## 2021-12-24 NOTE — Progress Notes (Signed)
Pulmonary Individual Treatment Plan  Patient Details  Name: Brian Casey MRN: [id number] Date of Birth: 02/24/1951 Referring Provider:   April Manson Pulmonary Rehab from 09/29/2021 in [facility] Cardiac and Pulmonary Rehab  Referring Provider Cyndie Chime [place] ([place])  [facility] Pulmonologist Dr. Lanney Gins       Initial Encounter Date:  Flowsheet Row Pulmonary Rehab from 09/29/2021 in [facility] Cardiac and Pulmonary Rehab  Date 09/29/21       Visit Diagnosis: ILD (interstitial lung disease) ([place])  Patient's Home Medications on Admission:  Current Outpatient Medications:    albuterol (VENTOLIN HFA) 108 (90 Base) MCG/ACT inhaler, , Disp: , Rfl:    ALPRAZolam (XANAX) 0.25 MG tablet, Take by mouth., Disp: , Rfl:    aspirin EC 81 MG tablet, Take 81 mg by mouth daily. Swallow whole., Disp: , Rfl:    atorvastatin (LIPITOR) 40 MG tablet, Take 40 mg by mouth daily., Disp: , Rfl:    budesonide (PULMICORT) 0.25 MG/2ML nebulizer solution, INHALE CONTENTS OF ONE VIAL/NEBULE VIA NEBULIZER TWO TIMES A DAY (Patient not taking: Reported on 11/05/2021), Disp: , Rfl:    carvedilol (COREG) 12.5 MG tablet, Take 12.5 mg by mouth 2 (two) times daily with a meal., Disp: , Rfl:    furosemide (LASIX) 20 MG tablet, Take 20 mg by mouth daily., Disp: , Rfl:    ipratropium-albuterol (DUONEB) 0.5-2.5 (3) MG/3ML SOLN, Take by nebulization., Disp: , Rfl:    levothyroxine (SYNTHROID) 100 MCG tablet, Take 100 mcg by mouth daily before breakfast., Disp: , Rfl:    levothyroxine (SYNTHROID, LEVOTHROID) 100 MCG tablet, Take 1 tablet (100 mcg total) by mouth daily before breakfast. OFFICE VISIT WITH  LABS REQUIRED FOR ADDITIONAL REFILLS (Patient not taking: Reported on 11/05/2021), Disp: 30 tablet, Rfl: 0   Nintedanib (OFEV) 150 MG CAPS, Take 1 capsule (150 mg total) by mouth 2 (two) times daily., Disp: 60 capsule, Rfl: 2   omeprazole (PRILOSEC) 20 MG capsule, Take 1 capsule (20 mg total) by mouth daily. OFFICE VISIT REQUIRED FOR  ADDITIONAL REFILLS, Disp: 30 capsule, Rfl: 0   predniSONE (DELTASONE) 10 MG tablet, Take 1 tablet (10 mg total) by mouth daily with breakfast., Disp: 90 tablet, Rfl: 0   tadalafil (CIALIS) 5 MG tablet, Take 1 tablet by mouth as needed., Disp: , Rfl:   Past Medical History: Past Medical History:  Diagnosis Date   Acute renal failure ([place]) 11/06/2015   [name] 11/06/2015   Adenomatous colon polyp    Anxiety    Arthritis    "lower spine; fingers" (11/07/2015)   Depression    Diverticulosis    GERD (gastroesophageal reflux disease)    High cholesterol    History of bronchitis    Hypertension 10/17/2013   Hyperthyroidism    "had it radiated in his '49s"   Hypothyroidism    ILD (interstitial lung disease) ([place])    New onset atrial fibrillation ([place]) 11/06/2015   [name] 11/07/2015   NSTEMI (non-ST elevated myocardial infarction) ([place]) 11/07/2015   Thyroid disease    Type II diabetes mellitus ([place])    type II   Wears glasses     Tobacco Use: Social History   Tobacco Use  Smoking Status Former   Packs/day: 1.00   Years: 48.00   Total pack years: 48.00   Types: Cigarettes   Quit date: 11/07/2015   Years since quitting: 6.1  Smokeless Tobacco Never    Labs: Review Flowsheet  More data exists      Latest Ref Rng &  Units 11/09/2015 11/13/2015 11/14/2015 11/15/2015 01/15/2016  Labs for ITP Cardiac and Pulmonary Rehab  Trlycerides <150 mg/dL 437  - - - -  Hemoglobin A1c 4.8 - 5.6 % - 7.1  - - 5.5   PH, Arterial 7.350 - 7.450 - 7.482  7.361  7.308  7.366  7.421  7.352  7.361  -  PCO2 arterial 35.0 - 45.0 mmHg - 32.2  40.3  52.3  38.2  39.5  38.4  39.3  -  Bicarbonate 20.0 - 24.0 mEq/L - 23.8  23.0  26.3  21.9  25.7  21.4  22.5  -  TCO2 0 - 100 mmol/L - 24._0 -  Acid-base deficit 0.0 - 2.0 mmol/L - - 2.0  3.0  4.0  3.0  -  O2 Saturation % - 97.1  97.0  100.0  99.0  100.0  61.7  95.0  98.0  -     Pulmonary Assessment  Scores:  Pulmonary Assessment Scores     Row Name 09/29/21 1351         ADL UCSD   ADL Phase Entry     SOB Score total 59     Rest 1     Walk 3     Stairs 5     Bath 2     Dress 2     Shop 2       CAT Score   CAT Score 23       mMRC Score   mMRC Score 2              UCSD: Self-administered rating of dyspnea associated with activities of daily living (ADLs) 6-point scale (0 = "not at all" to 5 = "maximal or unable to do because of breathlessness")  Scoring Scores range from 0 to 120.  Minimally important difference is 5 units  CAT: CAT can identify the health impairment of COPD patients and is better correlated with disease progression.  CAT has a scoring range of zero to 40. The CAT score is classified into four groups of low (less than 10), medium (10 - 20), high (21-30) and very high (31-40) based on the impact level of disease on health status. A CAT score over 10 suggests significant symptoms.  A worsening CAT score could be explained by an exacerbation, poor medication adherence, poor inhaler technique, or progression of COPD or comorbid conditions.  CAT MCID is 2 points  mMRC: mMRC (Modified Medical Research Council) Dyspnea Scale is used to assess the degree of baseline functional disability in patients of respiratory disease due to dyspnea. No minimal important difference is established. A decrease in score of 1 point or greater is considered a positive change.   Pulmonary Function Assessment:  Pulmonary Function Assessment - 09/29/21 1351       Breath   Shortness of Breath Yes;Fear of Shortness of Breath;Limiting activity             Exercise Target Goals: Exercise Program Goal: Individual exercise prescription set using results from initial 6 min walk test and THRR while considering  patient's activity barriers and safety.   Exercise Prescription Goal: Initial exercise prescription builds to 30-45 minutes a day of aerobic activity, 2-3 days per  week.  Home exercise guidelines will be given to patient during program as  part of exercise prescription that the participant will acknowledge.  Education: Aerobic Exercise: - Group verbal and visual presentation on the components of exercise prescription. Introduces F.I.T.T principle from ACSM for exercise prescriptions.  Reviews F.I.T.T. principles of aerobic exercise including progression. Written material given at graduation. Flowsheet Row Cardiac Rehab from 04/01/2016 in [facility] Cardiac and Pulmonary Rehab  Date 02/17/16  Educator [facility]  Instruction Review Code (retired) 2- meets goals/outcomes       Education: Resistance Exercise: - Group verbal and visual presentation on the components of exercise prescription. Introduces F.I.T.T principle from ACSM for exercise prescriptions  Reviews F.I.T.T. principles of resistance exercise including progression. Written material given at graduation. Flowsheet Row Pulmonary Rehab from 12/17/2021 in [facility] Cardiac and Pulmonary Rehab  Date 12/03/21  Educator NT  Instruction Review Code 1- [employer] Understanding        Education: Exercise & Equipment Safety: - Individual verbal instruction and demonstration of equipment use and safety with use of the equipment. Flowsheet Row Pulmonary Rehab from 12/17/2021 in [facility] Cardiac and Pulmonary Rehab  Date 09/29/21  Educator [facility]  Instruction Review Code 1- Verbalizes Understanding       Education: Exercise Physiology & General Exercise Guidelines: - Group verbal and written instruction with models to review the exercise physiology of the cardiovascular system and associated critical values. Provides general exercise guidelines with specific guidelines to those with heart or lung disease.    Education: Flexibility, Balance, Mind/Body Relaxation: - Group verbal and visual presentation with interactive activity on the components of exercise prescription. Introduces F.I.T.T principle from ACSM for exercise  prescriptions. Reviews F.I.T.T. principles of flexibility and balance exercise training including progression. Also discusses the mind body connection.  Reviews various relaxation techniques to help reduce and manage stress (i.e. Deep breathing, progressive muscle relaxation, and visualization). Balance handout provided to take home. Written material given at graduation. Flowsheet Row Pulmonary Rehab from 12/17/2021 in [facility] Cardiac and Pulmonary Rehab  Date 12/03/21  Educator NT  Instruction Review Code 1- Verbalizes Understanding       Activity Barriers & Risk Stratification:  Activity Barriers & Cardiac Risk Stratification - 09/29/21 1330       Activity Barriers & Cardiac Risk Stratification   Activity Barriers Shortness of Breath;Deconditioning;Muscular Weakness;Balance Concerns             6 Minute Walk:  6 Minute Walk     Row Name 09/29/21 1200         6 Minute Walk   Phase Initial     Distance 420 feet     Walk Time 2.4 minutes  stopped at 2:24 due to desaturation to 78%     # of Rest Breaks 0     MPH 1.98     METS 1.26     RPE 13     Perceived Dyspnea  3     VO2 Peak 4.41     Symptoms Yes (comment)     Comments SOB, desaturation (75% lowest)     Resting HR 66 bpm     Resting BP 126/64     Resting Oxygen Saturation  95 %     Exercise Oxygen Saturation  during 6 min walk 78 %  once seated dropped as low as 75%     Max Ex. HR 93 bpm     Max Ex. BP 152/70     2 Minute Post BP 128/64       Interval HR   1 Minute HR 85  2 Minute HR 92     3 Minute HR 93     2 Minute Post HR 69     Interval Heart Rate? Yes       Interval Oxygen   Interval Oxygen? Yes     Baseline Oxygen Saturation % 95 %     1 Minute Oxygen Saturation % 88 %     1 Minute Liters of Oxygen 0 L  Room Air     2 Minute Oxygen Saturation % 82 %     2 Minute Liters of Oxygen 0 L     3 Minute Oxygen Saturation % 78 %  at 2:24 75% once seated     3 Minute Liters of Oxygen 0 L     2 Minute  Post Oxygen Saturation % 89 %     2 Minute Post Liters of Oxygen 0 L             Oxygen Initial Assessment:  Oxygen Initial Assessment - 09/29/21 1350       Home Oxygen   Home Oxygen Device None    Sleep Oxygen Prescription None    Home Exercise Oxygen Prescription None    Home Resting Oxygen Prescription None    Compliance with Home Oxygen Use Yes      Initial 6 min Walk   Oxygen Used None      Program Oxygen Prescription   Program Oxygen Prescription Continuous;E-Tanks    Liters per minute 2    Comments order sent to MD for continous use      Intervention   Short Term Goals To learn and demonstrate proper pursed lip breathing techniques or other breathing techniques. ;To learn and understand importance of monitoring SPO2 with pulse oximeter and demonstrate accurate use of the pulse oximeter.;To learn and demonstrate proper use of respiratory medications;To learn and exhibit compliance with exercise, home and travel O2 prescription;To learn and understand importance of maintaining oxygen saturations>88%    Long  Term Goals Exhibits proper breathing techniques, such as pursed lip breathing or other method taught during program session;Demonstrates proper use of MDI's;Compliance with respiratory medication;Exhibits compliance with exercise, home  and travel O2 prescription;Verbalizes importance of monitoring SPO2 with pulse oximeter and return demonstration;Maintenance of O2 saturations>88%             Oxygen Re-Evaluation:  Oxygen Re-Evaluation     Row Name 10/13/21 1109 10/27/21 1121 11/12/21 1139 12/12/21 1024       Program Oxygen Prescription   Program Oxygen Prescription -- Continuous;E-Tanks Continuous;E-Tanks Continuous;E-Tanks    Liters per minute -- _0 Comments -- On oxygen for exercise. On oxygen for exercise. --      Home Oxygen   Home Oxygen Device -- None  Will ask MD at appt 8/15 None Home Concentrator;E-Tanks    Sleep Oxygen Prescription -- None  None None    Home Exercise Oxygen Prescription -- None  Asking MD about prescription Continuous Continuous    Liters per minute -- -- 4 4    Home Resting Oxygen Prescription -- None None None    Compliance with Home Oxygen Use -- Yes Yes Yes      Goals/Expected Outcomes   Short Term Goals -- To learn and demonstrate proper pursed lip breathing techniques or other breathing techniques. ;To learn and understand importance of monitoring SPO2 with pulse oximeter and demonstrate accurate use of the pulse oximeter.;To learn and demonstrate proper use of respiratory medications;To learn and  exhibit compliance with exercise, home and travel O2 prescription;To learn and understand importance of maintaining oxygen saturations>88% To learn and demonstrate proper pursed lip breathing techniques or other breathing techniques. ;To learn and understand importance of monitoring SPO2 with pulse oximeter and demonstrate accurate use of the pulse oximeter.;To learn and demonstrate proper use of respiratory medications;To learn and exhibit compliance with exercise, home and travel O2 prescription;To learn and understand importance of maintaining oxygen saturations>88% Other;To learn and exhibit compliance with exercise, home and travel O2 prescription;To learn and understand importance of monitoring SPO2 with pulse oximeter and demonstrate accurate use of the pulse oximeter.;To learn and understand importance of maintaining oxygen saturations>88%;To learn and demonstrate proper pursed lip breathing techniques or other breathing techniques. ;To learn and demonstrate proper use of respiratory medications    Long  Term Goals -- Exhibits proper breathing techniques, such as pursed lip breathing or other method taught during program session;Demonstrates proper use of MDI's;Compliance with respiratory medication;Exhibits compliance with exercise, home  and travel O2 prescription;Verbalizes importance of monitoring SPO2 with pulse  oximeter and return demonstration;Maintenance of O2 saturations>88% Exhibits proper breathing techniques, such as pursed lip breathing or other method taught during program session;Demonstrates proper use of MDI's;Compliance with respiratory medication;Exhibits compliance with exercise, home  and travel O2 prescription;Verbalizes importance of monitoring SPO2 with pulse oximeter and return demonstration;Maintenance of O2 saturations>88% Other;Exhibits proper breathing techniques, such as pursed lip breathing or other method taught during program session;Maintenance of O2 saturations>88%;Verbalizes importance of monitoring SPO2 with pulse oximeter and return demonstration;Compliance with respiratory medication;Exhibits compliance with exercise, home  and travel O2 prescription;Demonstrates proper use of MDI's    Comments Reviewed PLB technique with pt.  Talked about how it works and it's importance in maintaining their exercise saturations. Br[place]am does not have oxygen for home yet. EP did not go over home exercise at this time due to wanting home O2 prescription first. He is compliant with it as rehab and responds to it well. He did confirm that he has a pulse ox at home but does not check his O2 while during activity. I encouraged him to start that immediately and to note and stop when he is below 88%. He has been practicing PLB since he learned it his firts day at rehab and feels it helps him tremendously. [place] is up to 4L of oxygen with exercise. His O2 saturation has been dropping very quickly with exercise. He has been practicing PLB since learning it in rehab and feels that it has helped. He has been checking his O2 at home using his personal pulse ox. He has a biopsy later this week to specifically diagnose his ILD. Larrys doctor cancelled his biopsy. He said that his doctors are treating his Ofev. He is wearing his oxygen like he is supposed to at home. He is checking his pulse oximeter at home and knows he  needs to be 88 percent and above. He is doing better with PLB and is praticing at home. Nebulizers are schedules as needed for his breathing and takes them seldome.    Goals/Expected Outcomes Short: Become more profiecient at using PLB.   Long: Become independent at using PLB. Short: Start checking O2 during activity at home, stop and PLB at 88% or low/ talk to doc about home o2 prescription Long: Become efficient at PLB Short: Continue to use pulse ox to check O2 at home. Long: Become more efficient at PLB. Short: practice PLB and diaphragmatic breathing at home. Long: Use PLB and  diaphragmatic breathing independently post [facility].             Oxygen Discharge (Final Oxygen Re-Evaluation):  Oxygen Re-Evaluation - 12/12/21 1024       Program Oxygen Prescription   Program Oxygen Prescription Continuous;E-Tanks    Liters per minute 4      Home Oxygen   Home Oxygen Device Home Concentrator;E-Tanks    Sleep Oxygen Prescription None    Home Exercise Oxygen Prescription Continuous    Liters per minute 4    Home Resting Oxygen Prescription None    Compliance with Home Oxygen Use Yes      Goals/Expected Outcomes   Short Term Goals Other;To learn and exhibit compliance with exercise, home and travel O2 prescription;To learn and understand importance of monitoring SPO2 with pulse oximeter and demonstrate accurate use of the pulse oximeter.;To learn and understand importance of maintaining oxygen saturations>88%;To learn and demonstrate proper pursed lip breathing techniques or other breathing techniques. ;To learn and demonstrate proper use of respiratory medications    Long  Term Goals Other;Exhibits proper breathing techniques, such as pursed lip breathing or other method taught during program session;Maintenance of O2 saturations>88%;Verbalizes importance of monitoring SPO2 with pulse oximeter and return demonstration;Compliance with respiratory medication;Exhibits compliance with exercise,  home  and travel O2 prescription;Demonstrates proper use of MDI's    Comments Larrys doctor cancelled his biopsy. He said that his doctors are treating his Ofev. He is wearing his oxygen like he is supposed to at home. He is checking his pulse oximeter at home and knows he needs to be 88 percent and above. He is doing better with PLB and is praticing at home. Nebulizers are schedules as needed for his breathing and takes them seldome.    Goals/Expected Outcomes Short: practice PLB and diaphragmatic breathing at home. Long: Use PLB and diaphragmatic breathing independently post [facility].             Initial Exercise Prescription:  Initial Exercise Prescription - 09/29/21 1300       Date of Initial Exercise RX and Referring Provider   Date 09/29/21    Referring Provider Cyndie Chime MD ([place])   Primary Pulmonologist Dr. Lanney Gins     Oxygen   Oxygen Continuous    Liters 2    Maintain Oxygen Saturation 88% or higher      REL-XR   Level 1    Speed 50    Minutes 15    METs 2      Biostep-RELP   Level 2    SPM 50    Minutes 15    METs 2      Track   Laps 25    Minutes 15    METs 2.36      Prescription Details   Frequency (times per week) 3    Duration Progress to 30 minutes of continuous aerobic without signs/symptoms of physical distress      Intensity   THRR 40-80% of Max Heartrate 100-134    Ratings of Perceived Exertion 11-13    Perceived Dyspnea 0-4      Progression   Progression Continue to progress workloads to maintain intensity without signs/symptoms of physical distress.      Resistance Training   Training Prescription Yes    Weight 3 lb    Reps 10-15             Perform Capillary Blood Glucose checks as needed.  Exercise Prescription Changes:   Exercise Prescription Changes  [place] Name 09/29/21 1300 10/22/21 0700 11/03/21 0900 11/17/21 1400 12/03/21 0700     Response to Exercise   Blood Pressure (Admit) 126/64 142/80 106/64 132/62 124/82    Blood Pressure (Exercise) 152/70 160/80 148/68 128/64 --   Blood Pressure (Exit) 162/70  rck 142/74 130/78 120/70 126/72 114/60   Heart Rate (Admit) 66 bpm 64 bpm 67 bpm 74 bpm 67 bpm   Heart Rate (Exercise) 93 bpm 112 bpm 93 bpm 110 bpm 90 bpm   Heart Rate (Exit) 68 bpm 81 bpm 75 bpm 77 bpm 71 bpm   Oxygen Saturation (Admit) 95 % 97 % 97 % 90 % 98 %   Oxygen Saturation (Exercise) 75 % 88 % 89 % 91 % 88 %   Oxygen Saturation (Exit) 92 % 94 % 95 % 95 % 98 %   Rating of Perceived Exertion (Exercise) _0 Perceived Dyspnea (Exercise) _1 Symptoms SOB, desaturation SOB SOB SOB SOB   Comments walk test results 1st full day of exercise -- -- --   Duration -- Progress to 30 minutes of  aerobic without signs/symptoms of physical distress Continue with 30 min of aerobic exercise without signs/symptoms of physical distress. Continue with 30 min of aerobic exercise without signs/symptoms of physical distress. Continue with 30 min of aerobic exercise without signs/symptoms of physical distress.   Intensity -- THRR unchanged THRR unchanged THRR unchanged THRR unchanged     Progression   Progression -- Continue to progress workloads to maintain intensity without signs/symptoms of physical distress. Continue to progress workloads to maintain intensity without signs/symptoms of physical distress. Continue to progress workloads to maintain intensity without signs/symptoms of physical distress. Continue to progress workloads to maintain intensity without signs/symptoms of physical distress.   Average METs -- 2.16 2.69 2.12 2.45     Resistance Training   Training Prescription -- Yes Yes Yes Yes   Weight -- 3 lb 3 lb 3 lb 3 lb   Reps -- 10-15 10-15 10-15 10-15     Interval Training   Interval Training -- No No No No     Oxygen   Oxygen -- Continuous Continuous Continuous Continuous   Liters -- _2 NuStep   Level -- 1 2 -- --   Minutes -- 15 15 -- --   METs -- 2.8 3.3 --  --     Arm Ergometer   Level -- 1 1 -- --   Minutes -- 15 15 -- --   METs -- 2.3 2.17 -- --     REL-XR   Level -- _3 Minutes -- _4 METs -- 2.8 2.8 2.5 2.5     Biostep-RELP   Level -- _5 Minutes -- _6 METs -- _7 Track   Laps -- _8 Minutes -- _9 METs -- 1.54 1.87 1.87 1.92     Oxygen   Maintain Oxygen Saturation -- 88% or higher -- 88% or higher 88% or higher    Row Name 12/16/21 1500             Response to Exercise   Blood Pressure (Admit) 118/68       Blood Pressure (Exit) 110/60  Heart Rate (Admit) 75 bpm       Heart Rate (Exercise) 93 bpm       Heart Rate (Exit) 78 bpm       Oxygen Saturation (Admit) 93 %       Oxygen Saturation (Exercise) 88 %       Oxygen Saturation (Exit) 96 %       Rating of Perceived Exertion (Exercise) 13       Perceived Dyspnea (Exercise) 3       Symptoms SOB       Duration Continue with 30 min of aerobic exercise without signs/symptoms of physical distress.       Intensity THRR unchanged         Progression   Progression Continue to progress workloads to maintain intensity without signs/symptoms of physical distress.       Average METs 2.46         Resistance Training   Training Prescription Yes       Weight 3 lb       Reps 10-15         Interval Training   Interval Training No         Oxygen   Oxygen Continuous       Liters 4         NuStep   Level 2       Minutes 15       METs 3.3         REL-XR   Level 2       Minutes 15       METs 2.5         Track   Laps 16       Minutes 16       METs 1.87         Oxygen   Maintain Oxygen Saturation 88% or higher                Exercise Comments:   Exercise Goals and Review:   Exercise Goals     Row Name 09/29/21 1348             Exercise Goals   Increase Physical Activity Yes       Intervention Provide advice, education, support and counseling about physical activity/exercise  needs.;Develop an individualized exercise prescription for aerobic and resistive training based on initial evaluation findings, risk stratification, comorbidities and participant's personal goals.       Expected Outcomes Short Term: Attend rehab on a regular basis to increase amount of physical activity.;Long Term: Add in home exercise to make exercise part of routine and to increase amount of physical activity.;Long Term: Exercising regularly at least 3-5 days a week.       Increase Strength and Stamina Yes       Intervention Provide advice, education, support and counseling about physical activity/exercise needs.;Develop an individualized exercise prescription for aerobic and resistive training based on initial evaluation findings, risk stratification, comorbidities and participant's personal goals.       Expected Outcomes Short Term: Increase workloads from initial exercise prescription for resistance, speed, and METs.;Short Term: Perform resistance training exercises routinely during rehab and add in resistance training at home;Long Term: Improve cardiorespiratory fitness, muscular endurance and strength as measured by increased METs and functional capacity (6MWT)       Able to understand and use rate of perceived exertion (RPE) scale Yes       Intervention Provide education and explanation on how to  use RPE scale       Expected Outcomes Short Term: Able to use RPE daily in rehab to express subjective intensity level       Able to understand and use Dyspnea scale Yes       Intervention Provide education and explanation on how to use Dyspnea scale       Expected Outcomes Short Term: Able to use Dyspnea scale daily in rehab to express subjective sense of shortness of breath during exertion;Long Term: Able to use Dyspnea scale to guide intensity level when exercising independently       Knowledge and understanding of Target Heart Rate Range (THRR) Yes       Intervention Provide education and explanation  of THRR including how the numbers were predicted and where they are located for reference       Expected Outcomes Short Term: Able to state/look up THRR;Short Term: Able to use daily as guideline for intensity in rehab;Long Term: Able to use THRR to govern intensity when exercising independently       Able to check pulse independently Yes       Intervention Provide education and demonstration on how to check pulse in carotid and radial arteries.;Review the importance of being able to check your own pulse for safety during independent exercise       Expected Outcomes Long Term: Able to check pulse independently and accurately;Short Term: Able to explain why pulse checking is important during independent exercise       Understanding of Exercise Prescription Yes       Intervention Provide education, explanation, and written materials on patient's individual exercise prescription       Expected Outcomes Short Term: Able to explain program exercise prescription;Long Term: Able to explain home exercise prescription to exercise independently                Exercise Goals Re-Evaluation :  Exercise Goals Re-Evaluation     Row Name 10/13/21 1107 10/22/21 0723 10/27/21 1117 11/03/21 0920 11/12/21 1120     Exercise Goal Re-Evaluation   Exercise Goals Review Increase Physical Activity;Able to understand and use rate of perceived exertion (RPE) scale;Knowledge and understanding of Target Heart Rate Range (THRR);Understanding of Exercise Prescription;Increase Strength and Stamina;Able to check pulse independently;Able to understand and use Dyspnea scale Increase Physical Activity;Increase Strength and Stamina;Understanding of Exercise Prescription Increase Physical Activity;Increase Strength and Stamina;Understanding of Exercise Prescription Increase Physical Activity;Increase Strength and Stamina;Understanding of Exercise Prescription Increase Physical Activity;Increase Strength and Stamina;Understanding of  Exercise Prescription   Comments Reviewed RPE and dyspnea scales, THR and program prescription with pt today.  Pt voiced understanding and was given a copy of goals to take home. Torie is off to a good start in rehab. He had an average overall MET level of 2.16 METs. He did well with the biostep on level 2 as well. He also got 10 laps on the track, so we will tell him to push for more. We will continue to monitor his progress in the program. Deshon is doing some walking at home, but nothing structured too much. He does not have an O2 oxygen prescription for home yet. He is still talking with doctor about it and has an appointment with his Pulmonologist on 8/15. EP will hold off on going over home exercise until the oxygen prescription is established. We talked about having him use his own pulse ox to check during his activity at home, stop/rest/PLB when needed and when oxygen is below  88%. He plans to keep walking at home for his home exercise plan. Thaison is doing well in rehab. He does desat sometimes during his exercise, and is now using up to 4L of O2 some sessions. He stays above 88%. We have contacted his doctor about getting an oxygen prescription update.  He was able to complete 16 laps on the track and increased to level 2 on the T4.  We will continue to monitor. Kaisei is doing well in rehab. He has experienced a lot of SOB with exercise which he is frustrated with. He is walking at rehab but has to stop frequently to get his O2 saturation to rise back to normal levels. He is also doing seated equipment and doing well with it. 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 We will continue to monitor his progress in the program.   Expected Outcomes Short: Use RPE daily to regulate intensity. Long: Follow program prescription in THR. Short: Continue to push for more laps on the track. Long: Continue to increase strength and  stamina. Short: EP to go over home exercise, check O2 at home diligently Long: Exercise independently at home at appropriate prescription Short: Continue to watch O2 closely, increase laps on track if O2 allows Long: Continue to increase overall MET level Short: Continue to watch O2 closely, increase laps on track if O2 allows Long: Continue to increase overall MET level    Row Name 11/17/21 1359 12/03/21 0758 12/16/21 1523         Exercise Goal Re-Evaluation   Exercise Goals Review Increase Physical Activity;Increase Strength and Stamina;Understanding of Exercise Prescription Increase Physical Activity;Increase Strength and Stamina;Understanding of Exercise Prescription Increase Physical Activity;Increase Strength and Stamina;Understanding of Exercise Prescription     Comments Janet is doing well in rehab.  He is now on home oxgen therapy for activity!  He did request to hold off on home exercise review until her gets used to home oxygen and gets through a few more appoinments.  He did bring his oxygen in with him last week, but would forget to turn it back to come into class after rest in waiting area.  He was encouraged to keep in mind to turn it back on anytime he is moving.  He is holding at 16 laps and still needs to rest after two to three laps.  We will conitnue to monitor his progress. Trelon is doing well in rehab. He recently increased his oxygen from 4 lpm to 6 lpm. He also improved to walking 17 laps on the track. He improved to level 3 on the XR as well. We will continue to monitor his progress in the program. Elhadj has been coming consistent to rehab. Max laps on track still averages around 16, if he drops below 88% he is aware to stop and PLB, which he already does frequently. We hope to see him able to minimize the amount of stops during his exercise, while maintaining appropriate RPEs and a high O2 saturation. He workd up to 3.3  METS on the T4 Nustep. Will continue to monitor     Expected  Outcomes Short: Continue to use O2 more at home, review home exercise guidelines Long: Continue to improve stamina Short: Continue to increase laps on the track. Long: Continue to improve strength and stamina. Short: Minimize laps on track considering breathing and O2 saturation Long: Continue to increase  overall MET level              Discharge Exercise Prescription (Final Exercise Prescription Changes):  Exercise Prescription Changes - 12/16/21 1500       Response to Exercise   Blood Pressure (Admit) 118/68    Blood Pressure (Exit) 110/60    Heart Rate (Admit) 75 bpm    Heart Rate (Exercise) 93 bpm    Heart Rate (Exit) 78 bpm    Oxygen Saturation (Admit) 93 %    Oxygen Saturation (Exercise) 88 %    Oxygen Saturation (Exit) 96 %    Rating of Perceived Exertion (Exercise) 13    Perceived Dyspnea (Exercise) 3    Symptoms SOB    Duration Continue with 30 min of aerobic exercise without signs/symptoms of physical distress.    Intensity THRR unchanged      Progression   Progression Continue to progress workloads to maintain intensity without signs/symptoms of physical distress.    Average METs 2.46      Resistance Training   Training Prescription Yes    Weight 3 lb    Reps 10-15      Interval Training   Interval Training No      Oxygen   Oxygen Continuous    Liters 4      NuStep   Level 2    Minutes 15    METs 3.3      REL-XR   Level 2    Minutes 15    METs 2.5      Track   Laps 16    Minutes 16    METs 1.87      Oxygen   Maintain Oxygen Saturation 88% or higher             Nutrition:  Target Goals: Understanding of nutrition guidelines, daily intake of sodium <1567m, cholesterol <2033m calories 30% from fat and 7% or less from saturated fats, daily to have 5 or more servings of fruits and vegetables.  Education: All About Nutrition: -Group instruction provided by verbal, written material, interactive activities, discussions, models, and posters to  present general guidelines for heart healthy nutrition including fat, fiber, MyPlate, the role of sodium in heart healthy nutrition, utilization of the nutrition label, and utilization of this knowledge for meal planning. Follow up email sent as well. Written material given at graduation. [facility]  Education need identified 09/29/21  Date 12/17/21  Educator MCTubacInstruction Review Code 1- Verbalizes Understanding       Biometrics:  Pre Biometrics - 09/29/21 1348       Pre Biometrics   Height 5' 7.6" (1.717 m)    Weight 206 lb (93.4 kg)    BMI (Calculated) 31.7    Single Leg Stand 2.8 seconds              Nutrition Therapy Plan and Nutrition Goals:  Nutrition Therapy & Goals - 11/03/21 1209       Nutrition Therapy   RD appointment deferred Yes   LaDayleould like to defer a nutrition appt. due to his schedule and having many appointments coming up. Will continue to follow up.     Personal Nutrition Goals   Nutrition Goal LaErmiasould like to defer a nutrition appt. due to his schedule and having many appointments coming up. Will continue to follow up.             Nutrition Assessments:  MEDIFICTS Score Key: ?70 Need to make dietary changes  40-70 Heart Healthy Diet ? 40 Therapeutic Level Cholesterol Diet  Flowsheet Row Pulmonary Rehab from 09/29/2021 in [facility] Cardiac and Pulmonary Rehab  Picture Your Plate Total Score on Admission 50      Picture Your Plate Scores: <97 Unhealthy dietary pattern with much room for improvement. 41-50 Dietary pattern unlikely to meet recommendations for good health and room for improvement. 51-60 More healthful dietary pattern, with some room for improvement.  >60 Healthy dietary pattern, although there may be some specific behaviors that could be improved.   Nutrition Goals Re-Evaluation:  Nutrition Goals Re-Evaluation     [place] Name 10/27/21 1336 12/12/21  1032           Goals   Current Weight -- 197 lb (89.4 kg)      Nutrition Goal -- Maintain eating smaller meals.      Comment Trai has yet to meet with the RD. Patient was informed on why it is important to maintain a balanced diet when dealing with Respiratory issues. Explained that it takes a lot of energy to breath and when they are short of breath often they will need to have a good diet to help keep up with the calories they are expending for breathing.      Expected Outcome Short: Make appt with RD and attend Long: Follow RD goals long-term Short: Choose and plan snacks accordingly to patients caloric intake to improve breathing. Long: Maintain a diet independently that meets their caloric intake to aid in daily shortness of breath.               Nutrition Goals Discharge (Final Nutrition Goals Re-Evaluation):  Nutrition Goals Re-Evaluation - 12/12/21 1032       Goals   Current Weight 197 lb (89.4 kg)    Nutrition Goal Maintain eating smaller meals.    Comment Patient was informed on why it is important to maintain a balanced diet when dealing with Respiratory issues. Explained that it takes a lot of energy to breath and when they are short of breath often they will need to have a good diet to help keep up with the calories they are expending for breathing.    Expected Outcome Short: Choose and plan snacks accordingly to patients caloric intake to improve breathing. Long: Maintain a diet independently that meets their caloric intake to aid in daily shortness of breath.             Psychosocial: Target Goals: Acknowledge presence or absence of significant depression and/or stress, maximize coping skills, provide positive support system. Participant is able to verbalize types and ability to use techniques and skills needed for reducing stress and depression.   Education: Stress, Anxiety, and Depression - Group verbal and visual presentation to define topics covered.  Reviews  how body is impacted by stress, anxiety, and depression.  Also discusses healthy ways to reduce stress and to treat/manage anxiety and depression.  Written material given at graduation. Flowsheet Row Cardiac Rehab from 04/01/2016 in [facility] Cardiac and Pulmonary Rehab  Date 04/01/16  Educator [facility]  Instruction Review Code (retired) 2- meets goals/outcomes       Education: Sleep Hygiene -Provides group verbal and written instruction about how sleep can affect your health.  Define sleep hygiene, discuss sleep cycles and impact of sleep habits. Review good sleep hygiene tips.    Initial Review & Psychosocial Screening:  Initial Psych Review & Screening - 09/12/21 1421  Initial Review   Current issues with Current Sleep Concerns;Current Psychotropic Meds;Current Stress Concerns    Source of Stress Concerns Chronic Illness;Unable to participate in former interests or hobbies    Comments THis illness is not me. I can not do what I used to do in the past. I am stopped now from participating in what I used to do.      Family Dynamics   Good Support System? Yes   wife, children daughter and son     Barriers   Psychosocial barriers to participate in program There are no identifiable barriers or psychosocial needs.      Screening Interventions   Interventions Encouraged to exercise    Expected Outcomes Short Term goal: Utilizing psychosocial counselor, staff and physician to assist with identification of specific Stressors or current issues interfering with healing process. Setting desired goal for each stressor or current issue identified.;Long Term Goal: Stressors or current issues are controlled or eliminated.;Short Term goal: Identification and review with participant of any Quality of Life or Depression concerns found by scoring the questionnaire.;Long Term goal: The participant improves quality of Life and PHQ9 Scores as seen by post scores and/or verbalization of changes              Quality of Life Scores:  Scores of 19 and below usually indicate a poorer quality of life in these areas.  A difference of  2-3 points is a clinically meaningful difference.  A difference of 2-3 points in the total score of the Quality of Life Index has been associated with significant improvement in overall quality of life, self-image, physical symptoms, and general health in studies assessing change in quality of life.  PHQ-9: Review Flowsheet       12/12/2021 09/29/2021 03/25/2016 02/10/2016  Depression screen PHQ 2/9  Decreased Interest 2 0 0 1  Down, Depressed, Hopeless 1 1 0 0  PHQ - 2 Score 3 1 0 1  Altered sleeping _0 0  Tired, decreased energy 1 2 0 0  Change in appetite 1 1 0 1  Feeling bad or failure about yourself  0 1 0 1  Trouble concentrating 0 0 0 0  Moving slowly or fidgety/restless 0 0 0 0  Suicidal thoughts 0 0 0 0  PHQ-9 Score _1 Difficult doing work/chores Somewhat difficult Somewhat difficult Not difficult at all Not difficult at all   Interpretation of Total Score  Total Score Depression Severity:  1-4 = Minimal depression, 5-9 = Mild depression, 10-14 = Moderate depression, 15-19 = Moderately severe depression, 20-27 = Severe depression   Psychosocial Evaluation and Intervention:  Psychosocial Evaluation - 09/12/21 1428       Psychosocial Evaluation & Interventions   Interventions Encouraged to exercise with the program and follow exercise prescription    Comments Phat has no barriers to attending the program. He lives with his wife and she is his "advocate". She is with him at all his appointments. THey have a daughter and son that are support too.  Jordell wants to be able to be more active with less shortness of breath. He is streeesd some over the fact he is not able to participate in activities that he enjoys.  He is ready to get started with the program.    Expected Outcomes STG Jamerson is able to attend all scheduled sessions, he is able to  learn and use breathing techniques and mange his pulmonary meds to help decrease his symptoms.  He has been in the Cardiac Rehab program in the past. He has quit tobacco, 2017, and has no desire to start using any form of tobacco.  He is ready to get started.    Continue Psychosocial Services  Follow up required by staff             Psychosocial Re-Evaluation:  Psychosocial Re-Evaluation     [place] Name 10/27/21 1123 11/12/21 1129 12/12/21 1039         Psychosocial Re-Evaluation   Current issues with Current Sleep Concerns Current Sleep Concerns Current Stress Concerns;History of Depression;Current Anxiety/Panic     Comments Ladislav's sleep is all over the place. He sometimes gets 3 hours, sometimes 8. His doctor put him on prednisone since May- states his doctor told him he needs to take it and doesn't really have any other options for sleep alternatives. He takes Xanax before bed which he feels helps him somewhat but still frustrated he doesn't get consistent sleep. Really encouraged him to follow up with his doctor again who he sees on the 15th to let them know how much his sleep is affected by the prednisone. His wife is good support and goes to him with his appointments. He hopes to see a better improvement with his symptoms the more he is in rehab. He is very motivated to do whatever it takes to manage his ILD. Agam denies any major stressors at this time. He expresses some frustration with his oxygen and his current treatments. He states that his sleep has improved to 5-6 hours a night since coming off prednisone. He is still taking xanax before bed which he states has helped with his sleep. He feels that his wife has been a great support for him and is very encouraging. Reviewed patient health questionnaire (PHQ-9) with patient for follow up. Previously, patients score indicated signs/symptoms of depression.  Reviewed to see if patient is improving symptom wise while in program.  Score stayed  the same.     Expected Outcomes Short: Talk to doctor about prednisone and sleep Long: Continue to maintain posititve attitude Short: continue to rely on wife for support Long: Continue to maintain posititve outlook Short: Continue to attend [facility] regularly for regular exercise and social engagement. Long: Continue to improve symptoms and manage a positive mental state.     Interventions Encouraged to attend Pulmonary Rehabilitation for the exercise Encouraged to attend Pulmonary Rehabilitation for the exercise Encouraged to attend Pulmonary Rehabilitation for the exercise     Continue Psychosocial Services  Follow up required by staff Follow up required by staff Follow up required by staff              Psychosocial Discharge (Final Psychosocial Re-Evaluation):  Psychosocial Re-Evaluation - 12/12/21 1039       Psychosocial Re-Evaluation   Current issues with Current Stress Concerns;History of Depression;Current Anxiety/Panic    Comments Reviewed patient health questionnaire (PHQ-9) with patient for follow up. Previously, patients score indicated signs/symptoms of depression.  Reviewed to see if patient is improving symptom wise while in program.  Score stayed the same.    Expected Outcomes Short: Continue to attend [facility] regularly for regular exercise and social engagement. Long: Continue to improve symptoms and manage a positive mental state.    Interventions Encouraged to attend Pulmonary Rehabilitation for the exercise    Continue Psychosocial Services  Follow up required by staff             Education: Education Goals: Education classes  will be provided on a weekly basis, covering required topics. Participant will state understanding/return demonstration of topics presented.  Learning Barriers/Preferences:   General Pulmonary Education Topics:  Infection Prevention: - Provides verbal and written material to individual with discussion of infection  control including proper hand washing and proper equipment cleaning during exercise session. Flowsheet Row Pulmonary Rehab from 12/17/2021 in [facility] Cardiac and Pulmonary Rehab  Date 09/29/21  Educator [facility]  Instruction Review Code 1- Verbalizes Understanding       Falls Prevention: - Provides verbal and written material to individual with discussion of falls prevention and safety. Flowsheet Row Pulmonary Rehab from 12/17/2021 in [facility] Cardiac and Pulmonary Rehab  Date 09/12/21  Educator SB  Instruction Review Code 1- Verbalizes Understanding       Chronic Lung Disease Review: - Group verbal instruction with posters, models, PowerPoint presentations and videos,  to review new updates, new respiratory medications, new advancements in procedures and treatments. Providing information on websites and "800" numbers for continued self-education. Includes information about supplement oxygen, available portable oxygen systems, continuous and intermittent flow rates, oxygen safety, concentrators, and Medicare reimbursement for oxygen. Explanation of Pulmonary Drugs, including class, frequency, complications, importance of spacers, rinsing mouth after steroid MDI's, and proper cleaning methods for nebulizers. Review of basic lung anatomy and physiology related to function, structure, and complications of lung disease. Review of risk factors. Discussion about methods for diagnosing sleep apnea and types of masks and machines for OSA. Includes a review of the use of types of environmental controls: home humidity, furnaces, filters, dust mite/pet prevention, HEPA vacuums. Discussion about weather changes, air quality and the benefits of nasal washing. Instruction on Warning signs, infection symptoms, calling MD promptly, preventive modes, and value of vaccinations. Review of effective airway clearance, coughing and/or vibration techniques. Emphasizing that all should Create an Action Plan. Written material given at  graduation. Flowsheet Row Pulmonary Rehab from 12/17/2021 in [facility] Cardiac and Pulmonary Rehab  Education need identified 09/29/21  Date 11/06/21  Educator [facility]  Instruction Review Code 1- Verbalizes Understanding       AED/CPR: - Group verbal and written instruction with the use of models to demonstrate the basic use of the AED with the basic ABC's of resuscitation.    Anatomy and Cardiac Procedures: - Group verbal and visual presentation and models provide information about basic cardiac anatomy and function. Reviews the testing methods done to diagnose heart disease and the outcomes of the test results. Describes the treatment choices: Medical Management, Angioplasty, or Coronary Bypass Surgery for treating various heart conditions including Myocardial Infarction, Angina, Valve Disease, and Cardiac Arrhythmias.  Written material given at graduation. Flowsheet Row Cardiac Rehab from 04/01/2016 in [facility] Cardiac and Pulmonary Rehab  Date 02/24/16  Educator TS  Instruction Review Code (retired) 2- meets goals/outcomes       Medication Safety: - Group verbal and visual instruction to review commonly prescribed medications for heart and lung disease. Reviews the medication, class of the drug, and side effects. Includes the steps to properly store meds and maintain the prescription regimen.  Written material given at graduation. Flowsheet Row Cardiac Rehab from 04/01/2016 in [facility] Cardiac and Pulmonary Rehab  Date 03/11/16  Marisue Humble 2]  Educator TS  Instruction Review Code (retired) 2- meets goals/outcomes       Other: -Provides group and verbal instruction on various topics (see comments)   Knowledge Questionnaire Score:  Knowledge Questionnaire Score - 09/29/21 1349       Knowledge Questionnaire Score  Pre Score 16/18              Core Components/Risk Factors/Patient Goals at Admission:  Personal Goals and Risk Factors at Admission - 09/29/21 1349       Core  Components/Risk Factors/Patient Goals on Admission    Weight Management Yes;Weight Loss;Obesity    Intervention Weight Management: Develop a combined nutrition and exercise program designed to reach desired caloric intake, while maintaining appropriate intake of nutrient and fiber, sodium and fats, and appropriate energy expenditure required for the weight goal.;Weight Management: Provide education and appropriate resources to help participant work on and attain dietary goals.;Weight Management/Obesity: Establish reasonable short term and long term weight goals.;Obesity: Provide education and appropriate resources to help participant work on and attain dietary goals.    Admit Weight 206 lb (93.4 kg)    Goal Weight: Short Term 200 lb (90.7 kg)    Goal Weight: Long Term 195 lb (88.5 kg)    Expected Outcomes Short Term: Continue to assess and modify interventions until short term weight is achieved;Weight Loss: Understanding of general recommendations for a balanced deficit meal plan, which promotes 1-2 lb weight loss per week and includes a negative energy balance of [phone] kcal/d;Understanding recommendations for meals to include 15-35% energy as protein, 25-35% energy from fat, 35-60% energy from carbohydrates, less than 27m of dietary cholesterol, 20-35 gm of total fiber daily;Understanding of distribution of calorie intake throughout the day with the consumption of 4-5 meals/snacks;Long Term: Adherence to nutrition and physical activity/exercise program aimed toward attainment of established weight goal    Improve shortness of breath with ADL's Yes    Intervention Provide education, individualized exercise plan and daily activity instruction to help decrease symptoms of SOB with activities of daily living.    Expected Outcomes Short Term: Improve cardiorespiratory fitness to achieve a reduction of symptoms when performing ADLs;Long Term: Be able to perform more ADLs without symptoms or delay the onset  of symptoms    Increase knowledge of respiratory medications and ability to use respiratory devices properly  Yes    Intervention Provide education and demonstration as needed of appropriate use of medications, inhalers, and oxygen therapy.    Expected Outcomes Short Term: Achieves understanding of medications use. Understands that oxygen is a medication prescribed by physician. Demonstrates appropriate use of inhaler and oxygen therapy.;Long Term: Maintain appropriate use of medications, inhalers, and oxygen therapy.    Diabetes Yes    Intervention Provide education about signs/symptoms and action to take for hypo/hyperglycemia.;Provide education about proper nutrition, including hydration, and aerobic/resistive exercise prescription along with prescribed medications to achieve blood glucose in normal ranges: Fasting glucose 65-99 mg/dL    Expected Outcomes Short Term: Participant verbalizes understanding of the signs/symptoms and immediate care of hyper/hypoglycemia, proper foot care and importance of medication, aerobic/resistive exercise and nutrition plan for blood glucose control.;Long Term: Attainment of HbA1C < 7%.    Hypertension Yes    Intervention Provide education on lifestyle modifcations including regular physical activity/exercise, weight management, moderate sodium restriction and increased consumption of fresh fruit, vegetables, and low fat dairy, alcohol moderation, and smoking cessation.;Monitor prescription use compliance.    Expected Outcomes Short Term: Continued assessment and intervention until BP is < 140/925mHG in hypertensive participants. < 130/809mG in hypertensive participants with diabetes, heart failure or chronic kidney disease.;Long Term: Maintenance of blood pressure at goal levels.    Lipids Yes    Intervention Provide education and support for participant on nutrition & aerobic/resistive exercise along  with prescribed medications to achieve LDL <29m, HDL >482m     Expected Outcomes Short Term: Participant states understanding of desired cholesterol values and is compliant with medications prescribed. Participant is following exercise prescription and nutrition guidelines.;Long Term: Cholesterol controlled with medications as prescribed, with individualized exercise RX and with personalized nutrition plan. Value goals: LDL < 7080mHDL > 40 mg.             Education:Diabetes - Individual verbal and written instruction to review signs/symptoms of diabetes, desired ranges of glucose level fasting, after meals and with exercise. Acknowledge that pre and post exercise glucose checks will be done for 3 sessions at entry of program. Flowsheet Row [facility]  Date 09/29/21  Educator [facility] Review Code 1- Verbalizes Understanding       Know Your Numbers and Heart Failure: - Group verbal and visual instruction to discuss disease risk factors for cardiac and pulmonary disease and treatment options.  Reviews associated critical values for Overweight/Obesity, Hypertension, Cholesterol, and Diabetes.  Discusses basics of heart failure: signs/symptoms and treatments.  Introduces Heart Failure Zone chart for action plan for heart failure.  Written material given at graduation. Flowsheet Row [facility]  Date 10/29/21  Educator SB  Instruction Review Code 1- Verbalizes Understanding       Core Components/Risk Factors/Patient Goals Review:   Goals and Risk Factor Review     Row Name 10/27/21 1129 11/12/21 1134 12/12/21 1030         Core Components/Risk Factors/Patient Goals Review   Personal Goals Review Diabetes;Improve shortness of breath with ADL's;Weight Management/Obesity;Hypertension Diabetes;Improve shortness of breath with ADL's;Weight Management/Obesity;Hypertension Improve shortness of breath with ADL's     Review LarQasim  checking his blood sugar, its ranging around 120 or less in the morning. His BP at home ranges [id number]./40astolic. His BP is running higher than normal because he has been on prednisone for a while. Because of that, his weight has increased to 204 lb. He used to be in the 190s. He is going to check with his doctor to see if there is something else he can take. He checks weight  about every other day. He does  not feel so much difference in SOB since rehab but only started a little while ago. He is still getting used to his oxygen during exercise. LarTravasates that his blood sugars are under control and his doctor said that he does not need medication for diabetes at this time. LarZoe checking his BP at home every day and states that he is staying around 130/70. LarBrodeates that he has gained some weight while on prednisone and would like to lose down to 190 lbs. He is still experiencing SOB and is getting used to his oxygen during exercise. Spoke to patient about their shortness of breath and what they can do to improve. Patient has been informed of breathing techniques when starting the program. Patient is informed to tell staff if they have had any med changes and that certain meds they are taking or not taking can be causing shortness of breath.     Expected Outcomes Short: Check with doctor on prednisone Long: Continue to manage lifestyel risk factors Short: Continue to check blood sugar and BP levels at home. Long: Continue to manage risk factors. Short: Attend [facility] regularly to improve shortness of breath with ADL's. Long: maintain independence  with ADL's              Core Components/Risk Factors/Patient Goals at Discharge (Final Review):   Goals and Risk Factor Review - 12/12/21 1030       Core Components/Risk Factors/Patient Goals Review   Personal Goals Review Improve shortness of breath with ADL's    Review Spoke to patient about their shortness of breath and what they can do  to improve. Patient has been informed of breathing techniques when starting the program. Patient is informed to tell staff if they have had any med changes and that certain meds they are taking or not taking can be causing shortness of breath.    Expected Outcomes Short: Attend [facility] regularly to improve shortness of breath with ADL's. Long: maintain independence with ADL's             ITP Comments:  ITP Comments     Row Name 09/12/21 1441 09/29/21 1158 10/01/21 1029 10/13/21 1106 10/28/21 1552   ITP Comments Virtual orientation call completed today. he has an appointment on Date: 09/29/2021  for EP eval and gym Orientation.  Documentation of diagnosis can be found in [facility] Date: 09/05/2021 . Completed 6MWT and gym orientation. Initial ITP created and sent for review to Dr. Zetta Bills, Medical Director. 30 Day review completed. Medical Director ITP review done, changes made as directed, and signed approval by Medical Director.   NEW First full day of exercise!  Patient was oriented to gym and equipment including functions, settings, policies, and procedures.  Patient's individual exercise prescription and treatment plan were reviewed.  All starting workloads were established based on the results of the 6 minute walk test done at initial orientation visit.  The plan for exercise progression was also introduced and progression will be customized based on patient's performance and goals. Sent note to Dr. Chase Caller regarding patient to get home oxygen therapy. Patient currently has rehab oxygen prescription, but still waiting for him to get some for home. Encouraged him to check O2 saturations at home with pulse ox. Patient to see Dr. Chase Caller on 8/15 but hope to hear back on prescription ASAP.    [place] Name 10/29/21 0954 11/26/21 1343 12/19/21 1141 12/24/21 0748     ITP Comments 30 Day review completed. Medical Director ITP review done, changes made as directed, and signed approval by Medical  Director. 30 Day review completed. Medical Director ITP review done, changes made as directed, and signed approval by Medical Director. Gave transplant info packet 30 Day review completed. Medical Director ITP review done, changes made as directed, and signed approval by Medical Director.             Comments:

## 2021-12-24 NOTE — Progress Notes (Signed)
Daily Session Note  Patient Details  Name: Brian Casey MRN: [id number] Date of Birth: 01-15-1951 Referring Provider:   April Manson Pulmonary Rehab from 09/29/2021 in [facility] Pulmonary [facility]  Referring Provider Cyndie Chime MD ([place])  [facility] Pulmonologist Dr. Lanney Gins       Encounter Date: 12/24/2021  Check In:  Session Check In - 12/24/21 1120       Check-In   Supervising physician immediately available to respond to emergencies See telemetry face sheet for immediately available ER MD    Location [facility]-Cardiac & Pulmonary Rehab    Staff Present Justin Mend, RCP,RRT,BSRT;Noah Tickle, BS, Exercise Physiologist;Kara Eliezer Bottom, MS, ASCM CEP, Exercise Physiologist;Durell Lofaso Sherryll Burger, RN Odelia Gage, RN, ADN    Virtual Visit No    Medication changes reported     No    Fall or balance concerns reported    No    Warm-up and Cool-down Performed on first and last piece of equipment    Resistance Training Performed Yes    VAD Patient? No    PAD/SET Patient? No      Pain Assessment   Currently in Pain? No/denies                Social History   Tobacco Use  Smoking Status Former   Packs/day: 1.00   Years: 48.00   Total pack years: 48.00   Types: Cigarettes   Quit date: 11/07/2015   Years since quitting: 6.1  Smokeless Tobacco Never    Goals Met:  Independence with exercise equipment Exercise tolerated well No report of concerns or symptoms today Strength training completed today  Goals Unmet:  Not Applicable  Comments: Pt able to follow exercise prescription today without complaint.  Will continue to monitor for progression.    Dr. Emily Filbert is Medical Director for [place].  Dr. Ottie Glazier is Medical Director for [facility] Pulmonary Rehabilitation.

## 2021-12-26 ENCOUNTER — Encounter: Payer: No Typology Code available for payment source | Admitting: *Deleted

## 2021-12-26 DIAGNOSIS — J849 Interstitial pulmonary disease, unspecified: Secondary | ICD-10-CM | POA: Diagnosis not present

## 2021-12-26 NOTE — Progress Notes (Signed)
Daily Session Note  Patient Details  Name: [name] MRN: [id number] Date of Birth: 1950/04/16 Referring Provider:   April Manson Pulmonary Rehab from 09/29/2021 in [facility] Cardiac and Pulmonary Rehab  Referring Provider Cyndie Chime MD ([place])  [facility] Pulmonologist Dr. Lanney Gins       Encounter Date: 12/26/2021  Check In:  Session Check In - 12/26/21 1137       Check-In   Supervising physician immediately available to respond to emergencies See telemetry face sheet for immediately available ER MD    Location [facility]-Cardiac & Pulmonary Rehab    Staff Present Heath Lark, RN, BSN, CCRP;Jessica Vass, MA, RCEP, CCRP, CCET;Joseph [place], [place]    Virtual Visit No    Medication changes reported     No    Fall or balance concerns reported    No    Warm-up and Cool-down Performed on first and last piece of equipment    Resistance Training Performed Yes    VAD Patient? No    PAD/SET Patient? No      Pain Assessment   Currently in Pain? No/denies                Social History   Tobacco Use  Smoking Status Former   Packs/day: 1.00   Years: 48.00   Total pack years: 48.00   Types: Cigarettes   Quit date: 11/07/2015   Years since quitting: 6.1  Smokeless Tobacco Never    Goals Met:  Proper associated with RPD/PD & O2 Sat Independence with exercise equipment Exercise tolerated well No report of concerns or symptoms today  Goals Unmet:  Not Applicable  Comments: Pt able to follow exercise prescription today without complaint.  Will continue to monitor for progression.    Dr. Emily Filbert is Medical Director for [place].  Dr. Ottie Glazier is Medical Director for [facility] Pulmonary Rehabilitation.

## 2021-12-29 ENCOUNTER — Encounter: Payer: No Typology Code available for payment source | Admitting: *Deleted

## 2021-12-29 DIAGNOSIS — J849 Interstitial pulmonary disease, unspecified: Secondary | ICD-10-CM

## 2021-12-29 NOTE — Progress Notes (Signed)
Daily Session Note  Patient Details  Name: Brian Casey MRN: [id number] Date of Birth: 1950-06-23 Referring Provider:   April Manson Pulmonary Rehab from 09/29/2021 in [facility] Cardiac and Pulmonary Rehab  Referring Provider Cyndie Chime MD ([place])  [facility] Pulmonologist Dr. Lanney Gins       Encounter Date: 12/29/2021  Check In:  Session Check In - 12/29/21 1152       Check-In   Supervising physician immediately available to respond to emergencies See telemetry face sheet for immediately available ER MD    Location [facility]-Cardiac & Pulmonary Rehab    Staff Present [name], MA, RCEP, CCRP, CCET;Joseph [place], Ernestina Patches, RN, [place]    Virtual Visit No    Medication changes reported     No    Fall or balance concerns reported    No    Warm-up and Cool-down Performed on first and last piece of equipment    Resistance Training Performed Yes    VAD Patient? No    PAD/SET Patient? No      Pain Assessment   Currently in Pain? No/denies                Social History   Tobacco Use  Smoking Status Former   Packs/day: 1.00   Years: 48.00   Total pack years: 48.00   Types: Cigarettes   Quit date: 11/07/2015   Years since quitting: 6.1  Smokeless Tobacco Never    Goals Met:  Independence with exercise equipment Exercise tolerated well No report of concerns or symptoms today Strength training completed today  Goals Unmet:  Not Applicable  Comments: Pt able to follow exercise prescription today without complaint.  Will continue to monitor for progression.    Dr. Emily Filbert is Medical Director for [place].  Dr. Ottie Glazier is Medical Director for [facility] Pulmonary Rehabilitation.

## 2021-12-31 ENCOUNTER — Encounter: Payer: No Typology Code available for payment source | Admitting: *Deleted

## 2021-12-31 VITALS — Ht 67.6 in | Wt 196.9 lb

## 2021-12-31 DIAGNOSIS — J849 Interstitial pulmonary disease, unspecified: Secondary | ICD-10-CM | POA: Diagnosis not present

## 2021-12-31 NOTE — Patient Instructions (Addendum)
Discharge Patient Instructions  Patient Details  Name: Brian Casey MRN: [id number] Date of Birth: 1951-02-14 Referring Provider:  [name], MD   Number of Visits: 36  Reason for Discharge:  Patient reached a stable level of exercise. Patient independent in their exercise. Patient has met program and personal goals.  Diagnosis:  ILD (interstitial lung disease) ([place])  Initial Exercise Prescription:  Initial Exercise Prescription - 09/29/21 1300       Date of Initial Exercise RX and Referring Provider   Date 09/29/21    Referring Provider Brian Chime MD ([place])   Primary Pulmonologist Dr. Lanney Casey     Oxygen   Oxygen Continuous    Liters 2    Maintain Oxygen Saturation 88% or higher      REL-XR   Level 1    Speed 50    Minutes 15    METs 2      Biostep-RELP   Level 2    SPM 50    Minutes 15    METs 2      Track   Laps 25    Minutes 15    METs 2.36      Prescription Details   Frequency (times per week) 3    Duration Progress to 30 minutes of continuous aerobic without signs/symptoms of physical distress      Intensity   THRR 40-80% of Max Heartrate 100-134    Ratings of Perceived Exertion 11-13    Perceived Dyspnea 0-4      Progression   Progression Continue to progress workloads to maintain intensity without signs/symptoms of physical distress.      Resistance Training   Training Prescription Yes    Weight 3 lb    Reps 10-15             Discharge Exercise Prescription (Final Exercise Prescription Changes):  Exercise Prescription Changes - 12/29/21 1500       Response to Exercise   Blood Pressure (Admit) 122/68    Blood Pressure (Exit) 122/64    Heart Rate (Admit) 72 bpm    Heart Rate (Exercise) 114 bpm    Heart Rate (Exit) 80 bpm    Oxygen Saturation (Admit) 98 %    Oxygen Saturation (Exercise) 83 %    Oxygen Saturation (Exit) 94 %    Rating of Perceived Exertion (Exercise) 13    Perceived Dyspnea (Exercise) 3    Symptoms  SOB    Duration Continue with 30 min of aerobic exercise without signs/symptoms of physical distress.    Intensity THRR unchanged      Progression   Progression Continue to progress workloads to maintain intensity without signs/symptoms of physical distress.    Average METs 1.95      Resistance Training   Training Prescription Yes    Weight 3 lb    Reps 10-15      Interval Training   Interval Training No      Oxygen   Oxygen Continuous    Liters 6      Arm Ergometer   Level 2    Minutes 15    METs 2.3      REL-XR   Level 1    Minutes 15      Biostep-RELP   Level 3    Minutes 15    METs 3      Track   Laps 18    Minutes 15    METs 1.98      Oxygen  Maintain Oxygen Saturation 88% or higher             Functional Capacity:  6 Minute Walk     Row Name 09/29/21 1200 12/31/21 1027       6 Minute Walk   Phase Initial Discharge    Distance 420 feet 745 feet    Distance % Change -- 77 %    Distance Feet Change -- 325 ft    Walk Time 2.4 minutes  stopped at 2:24 due to desaturation to 78% 4.83 minutes    # of Rest Breaks 0 1    MPH 1.98 1.75    METS 1.26 1.93    RPE 13 12    Perceived Dyspnea  3 1    VO2 Peak 4.41 6.77    Symptoms Yes (comment) Yes (comment)    Comments SOB, desaturation (75% lowest) SOB    Resting HR 66 bpm 71 bpm    Resting BP 126/64 146/78    Resting Oxygen Saturation  95 % 98 %    Exercise Oxygen Saturation  during 6 min walk 78 %  once seated dropped as low as 75% 80 %    Max Ex. HR 93 bpm 98 bpm    Max Ex. BP 152/70 148/76    2 Minute Post BP 128/64 134/72      Interval HR   1 Minute HR 85 77    2 Minute HR 92 98    3 Minute HR 93 95    4 Minute HR -- 91    5 Minute HR -- 91    6 Minute HR -- 93    2 Minute Post HR 69 72    Interval Heart Rate? Yes Yes      Interval Oxygen   Interval Oxygen? Yes Yes    Baseline Oxygen Saturation % 95 % 98 %    1 Minute Oxygen Saturation % 88 % 96 %    1 Minute Liters of Oxygen 0 L   Room Air 6 L    2 Minute Oxygen Saturation % 82 % 90 %    2 Minute Liters of Oxygen 0 L 6 L    3 Minute Oxygen Saturation % 78 %  at 2:24 75% once seated 80 %    3 Minute Liters of Oxygen 0 L 6 L    4 Minute Oxygen Saturation % -- 93 %    4 Minute Liters of Oxygen -- 6 L    5 Minute Oxygen Saturation % -- 92 %    5 Minute Liters of Oxygen -- 6 L    6 Minute Oxygen Saturation % -- 90 %    6 Minute Liters of Oxygen -- 6 L    2 Minute Post Oxygen Saturation % 89 % 99 %    2 Minute Post Liters of Oxygen 0 L 6 L              Nutrition & Weight - Outcomes:  Pre Biometrics - 09/29/21 1348       Pre Biometrics   Height 5' 7.6" (1.717 m)    Weight 206 lb (93.4 kg)    BMI (Calculated) 31.7    Single Leg Stand 2.8 seconds             Post Biometrics - 12/31/21 1036        Post  Biometrics   Height 5' 7.6" (1.717 m)    Weight 196 lb 14.4 oz (  89.3 kg)    BMI (Calculated) 30.3             Nutrition:  Nutrition Therapy & Goals - 11/03/21 1209       Nutrition Therapy   RD appointment deferred Yes   Brian Casey would like to defer a nutrition appt. due to his schedule and having many appointments coming up. Will continue to follow up.     Personal Nutrition Goals   Nutrition Goal Brian Casey would like to defer a nutrition appt. due to his schedule and having many appointments coming up. Will continue to follow up.

## 2021-12-31 NOTE — Progress Notes (Signed)
Daily Session Note  Patient Details  Name: Brian Casey MRN: [id number] Date of Birth: May 17, 1950 Referring Provider:   Flowsheet Row Pulmonary Rehab from 09/29/2021 in [facility] Cardiac and Pulmonary Rehab  Referring Provider Cyndie Chime MD ([place])  [facility] Pulmonologist Dr. Lanney Gins       Encounter Date: 12/31/2021  Check In:  Session Check In - 12/31/21 1121       Check-In   Supervising physician immediately available to respond to emergencies See telemetry face sheet for immediately available ER MD    Staff Present Antionette Fairy, BS, Exercise Physiologist;Joseph Rosebud Poles, RN, [place]    Virtual Visit No    Medication changes reported     No    Fall or balance concerns reported    No    Warm-up and Cool-down Performed on first and last piece of equipment    Resistance Training Performed Yes    VAD Patient? No    PAD/SET Patient? No      Pain Assessment   Currently in Pain? No/denies                Social History   Tobacco Use  Smoking Status Former   Packs/day: 1.00   Years: 48.00   Total pack years: 48.00   Types: Cigarettes   Quit date: 11/07/2015   Years since quitting: 6.1  Smokeless Tobacco Never    Goals Met:  Independence with exercise equipment Exercise tolerated well No report of concerns or symptoms today Strength training completed today  Goals Unmet:  Not Applicable  Comments: Pt able to follow exercise prescription today without complaint.  Will continue to monitor for progression.   6 Minute Walk     Row Name 09/29/21 1200 12/31/21 1027       6 Minute Walk   Phase Initial Discharge    Distance 420 feet 745 feet    Distance % Change -- 77 %    Distance Feet Change -- 325 ft    Walk Time 2.4 minutes  stopped at 2:24 due to desaturation to 78% 4.83 minutes    # of Rest Breaks 0 1    MPH 1.98 1.75    METS 1.26 1.93    RPE 13 12    Perceived Dyspnea  3 1    VO2 Peak 4.41 6.77    Symptoms Yes (comment) Yes  (comment)    Comments SOB, desaturation (75% lowest) SOB    Resting HR 66 bpm 71 bpm    Resting BP 126/64 146/78    Resting Oxygen Saturation  95 % 98 %    Exercise Oxygen Saturation  during 6 min walk 78 %  once seated dropped as low as 75% 80 %    Max Ex. HR 93 bpm 98 bpm    Max Ex. BP 152/70 148/76    2 Minute Post BP 128/64 134/72      Interval HR   1 Minute HR 85 77    2 Minute HR 92 98    3 Minute HR 93 95    4 Minute HR -- 91    5 Minute HR -- 91    6 Minute HR -- 93    2 Minute Post HR 69 72    Interval Heart Rate? Yes Yes      Interval Oxygen   Interval Oxygen? Yes Yes    Baseline Oxygen Saturation % 95 % 98 %    1 Minute Oxygen Saturation % 88 %  96 %    1 Minute Liters of Oxygen 0 L  Room Air 6 L    2 Minute Oxygen Saturation % 82 % 90 %    2 Minute Liters of Oxygen 0 L 6 L    3 Minute Oxygen Saturation % 78 %  at 2:24 75% once seated 80 %    3 Minute Liters of Oxygen 0 L 6 L    4 Minute Oxygen Saturation % -- 93 %    4 Minute Liters of Oxygen -- 6 L    5 Minute Oxygen Saturation % -- 92 %    5 Minute Liters of Oxygen -- 6 L    6 Minute Oxygen Saturation % -- 90 %    6 Minute Liters of Oxygen -- 6 L    2 Minute Post Oxygen Saturation % 89 % 99 %    2 Minute Post Liters of Oxygen 0 L 6 L               Dr. Emily Filbert is Medical Director for [place].  Dr. Ottie Glazier is Medical Director for [facility] Pulmonary Rehabilitation.

## 2022-01-02 ENCOUNTER — Encounter: Payer: No Typology Code available for payment source | Admitting: *Deleted

## 2022-01-02 DIAGNOSIS — J849 Interstitial pulmonary disease, unspecified: Secondary | ICD-10-CM | POA: Diagnosis not present

## 2022-01-02 NOTE — Progress Notes (Signed)
Daily Session Note  Patient Details  Name: HANISH LARAIA MRN: [id number] Date of Birth: 08-10-1950 Referring Provider:   Flowsheet Row Pulmonary Rehab from 09/29/2021 in [facility] Cardiac and Pulmonary Rehab  Referring Provider Cyndie Chime MD ([place])  [facility] Pulmonologist Dr. Lanney Gins       Encounter Date: 01/02/2022  Check In:      Social History   Tobacco Use  Smoking Status Former   Packs/day: 1.00   Years: 48.00   Total pack years: 48.00   Types: Cigarettes   Quit date: 11/07/2015   Years since quitting: 6.1  Smokeless Tobacco Never    Goals Met:  Independence with exercise equipment Exercise tolerated well No report of concerns or symptoms today  Goals Unmet:  Not Applicable  Comments: Pt able to follow exercise prescription today without complaint.  Will continue to monitor for progression.    Dr. Emily Filbert is Medical Director for [place].  Dr. Ottie Glazier is Medical Director for [facility] Pulmonary Rehabilitation.

## 2022-01-05 ENCOUNTER — Encounter: Payer: No Typology Code available for payment source | Admitting: *Deleted

## 2022-01-05 DIAGNOSIS — J849 Interstitial pulmonary disease, unspecified: Secondary | ICD-10-CM

## 2022-01-05 NOTE — Patient Instructions (Signed)
Discharge Patient Instructions  Patient Details  Name: Brian Casey MRN: [id number] Date of Birth: 02/25/1951 Referring Provider:  Hedrick, James, MD   Number of Visits: 36  Reason for Discharge:  Patient reached a stable level of exercise. Patient independent in their exercise. Patient has met program and personal goals.  Diagnosis:  ILD (interstitial lung disease) (HCC)  Initial Exercise Prescription:  Initial Exercise Prescription - 09/29/21 1300       Date of Initial Exercise RX and Referring Provider   Date 09/29/21    Referring Provider [place], John MD ([place])   Primary Pulmonologist Dr. Aleskerov     Oxygen   Oxygen Continuous    Liters 2    Maintain Oxygen Saturation 88% or higher      REL-XR   Level 1    Speed 50    Minutes 15    METs 2      Biostep-RELP   Level 2    SPM 50    Minutes 15    METs 2      Track   Laps 25    Minutes 15    METs 2.36      Prescription Details   Frequency (times per week) 3    Duration Progress to 30 minutes of continuous aerobic without signs/symptoms of physical distress      Intensity   THRR 40-80% of Max Heartrate 100-134    Ratings of Perceived Exertion 11-13    Perceived Dyspnea 0-4      Progression   Progression Continue to progress workloads to maintain intensity without signs/symptoms of physical distress.      Resistance Training   Training Prescription Yes    Weight 3 lb    Reps 10-15             Discharge Exercise Prescription (Final Exercise Prescription Changes):  Exercise Prescription Changes - 12/29/21 1500       Response to Exercise   Blood Pressure (Admit) 122/68    Blood Pressure (Exit) 122/64    Heart Rate (Admit) 72 bpm    Heart Rate (Exercise) 114 bpm    Heart Rate (Exit) 80 bpm    Oxygen Saturation (Admit) 98 %    Oxygen Saturation (Exercise) 83 %    Oxygen Saturation (Exit) 94 %    Rating of Perceived Exertion (Exercise) 13    Perceived Dyspnea (Exercise) 3    Symptoms  SOB    Duration Continue with 30 min of aerobic exercise without signs/symptoms of physical distress.    Intensity THRR unchanged      Progression   Progression Continue to progress workloads to maintain intensity without signs/symptoms of physical distress.    Average METs 1.95      Resistance Training   Training Prescription Yes    Weight 3 lb    Reps 10-15      Interval Training   Interval Training No      Oxygen   Oxygen Continuous    Liters 6      Arm Ergometer   Level 2    Minutes 15    METs 2.3      REL-XR   Level 1    Minutes 15      Biostep-RELP   Level 3    Minutes 15    METs 3      Track   Laps 18    Minutes 15    METs 1.98      Oxygen     Maintain Oxygen Saturation 88% or higher             Functional Capacity:  6 Minute Walk     Row Name 09/29/21 1200 12/31/21 1027       6 Minute Walk   Phase Initial Discharge    Distance 420 feet 745 feet    Distance % Change -- 77 %    Distance Feet Change -- 325 ft    Walk Time 2.4 minutes  stopped at 2:24 due to desaturation to 78% 4.83 minutes    # of Rest Breaks 0 1    MPH 1.98 1.75    METS 1.26 1.93    RPE 13 12    Perceived Dyspnea  3 1    VO2 Peak 4.41 6.77    Symptoms Yes (comment) Yes (comment)    Comments SOB, desaturation (75% lowest) SOB    Resting HR 66 bpm 71 bpm    Resting BP 126/64 146/78    Resting Oxygen Saturation  95 % 98 %    Exercise Oxygen Saturation  during 6 min walk 78 %  once seated dropped as low as 75% 80 %    Max Ex. HR 93 bpm 98 bpm    Max Ex. BP 152/70 148/76    2 Minute Post BP 128/64 134/72      Interval HR   1 Minute HR 85 77    2 Minute HR 92 98    3 Minute HR 93 95    4 Minute HR -- 91    5 Minute HR -- 91    6 Minute HR -- 93    2 Minute Post HR 69 72    Interval Heart Rate? Yes Yes      Interval Oxygen   Interval Oxygen? Yes Yes    Baseline Oxygen Saturation % 95 % 98 %    1 Minute Oxygen Saturation % 88 % 96 %    1 Minute Liters of Oxygen 0 L   Room Air 6 L    2 Minute Oxygen Saturation % 82 % 90 %    2 Minute Liters of Oxygen 0 L 6 L    3 Minute Oxygen Saturation % 78 %  at 2:24 75% once seated 80 %    3 Minute Liters of Oxygen 0 L 6 L    4 Minute Oxygen Saturation % -- 93 %    4 Minute Liters of Oxygen -- 6 L    5 Minute Oxygen Saturation % -- 92 %    5 Minute Liters of Oxygen -- 6 L    6 Minute Oxygen Saturation % -- 90 %    6 Minute Liters of Oxygen -- 6 L    2 Minute Post Oxygen Saturation % 89 % 99 %    2 Minute Post Liters of Oxygen 0 L 6 L              Nutrition & Weight - Outcomes:  Pre Biometrics - 09/29/21 1348       Pre Biometrics   Height 5' 7.6" (1.717 m)    Weight 206 lb (93.4 kg)    BMI (Calculated) 31.7    Single Leg Stand 2.8 seconds             Post Biometrics - 12/31/21 1036        Post  Biometrics   Height 5' 7.6" (1.717 m)    Weight 196 lb 14.4 oz (  89.3 kg)    BMI (Calculated) 30.3             Nutrition:  Nutrition Therapy & Goals - 11/03/21 1209       Nutrition Therapy   RD appointment deferred Yes   Brian Casey would like to defer a nutrition appt. due to his schedule and having many appointments coming up. Will continue to follow up.     Personal Nutrition Goals   Nutrition Goal Brian Casey would like to defer a nutrition appt. due to his schedule and having many appointments coming up. Will continue to follow up.            

## 2022-01-05 NOTE — Progress Notes (Signed)
Daily Session Note  Patient Details  Name: Brian Casey MRN: [id number] Date of Birth: 10-28-1950 Referring Provider:   April Manson Pulmonary Rehab from 09/29/2021 in [facility] Cardiac and Pulmonary Rehab  Referring Provider Cyndie Chime MD ([place])  [facility] Pulmonologist Dr. Lanney Gins       Encounter Date: 01/05/2022  Check In:  Session Check In - 01/05/22 1057       Check-In   Supervising physician immediately available to respond to emergencies See telemetry face sheet for immediately available ER MD    Location [facility]-Cardiac & Pulmonary Rehab    Staff Present Antionette Fairy, BS, Exercise Physiologist;Kelly Amedeo Plenty, BS, ACSM CEP, Exercise Physiologist;Ean Gettel Tamala Julian, RN, ADN    Virtual Visit No    Medication changes reported     No    Fall or balance concerns reported    No    Warm-up and Cool-down Performed on first and last piece of equipment    Resistance Training Performed Yes    VAD Patient? No    PAD/SET Patient? No      Pain Assessment   Currently in Pain? No/denies                Social History   Tobacco Use  Smoking Status Former   Packs/day: 1.00   Years: 48.00   Total pack years: 48.00   Types: Cigarettes   Quit date: 11/07/2015   Years since quitting: 6.1  Smokeless Tobacco Never    Goals Met:  Independence with exercise equipment Exercise tolerated well No report of concerns or symptoms today Strength training completed today  Goals Unmet:  Not Applicable  Comments: Pt able to follow exercise prescription today without complaint.  Will continue to monitor for progression.    Dr. Emily Filbert is Medical Director for [place].  Dr. Ottie Glazier is Medical Director for [facility] Pulmonary Rehabilitation.

## 2022-01-07 ENCOUNTER — Encounter: Payer: No Typology Code available for payment source | Admitting: *Deleted

## 2022-01-07 DIAGNOSIS — J849 Interstitial pulmonary disease, unspecified: Secondary | ICD-10-CM

## 2022-01-07 NOTE — Progress Notes (Signed)
Daily Session Note  Patient Details  Name: Brian Casey MRN: [id number] Date of Birth: 1950/06/18 Referring Provider:   April Manson Pulmonary Rehab from 09/29/2021 in [facility] Cardiac and Pulmonary Rehab  Referring Provider Cyndie Chime MD ([place])  [facility] Pulmonologist Dr. Lanney Gins       Encounter Date: 01/07/2022  Check In:  Session Check In - 01/07/22 1109       Check-In   Supervising physician immediately available to respond to emergencies See telemetry face sheet for immediately available ER MD    Location [facility]-Cardiac & Pulmonary Rehab    Staff Present Antionette Fairy, BS, Exercise Physiologist;Jessica Brooks, MA, RCEP, CCRP, Mindi Curling, RN, ADN;Meredith Sherryll Burger, RN BSN    Virtual Visit No    Medication changes reported     No    Fall or balance concerns reported    No    Warm-up and Cool-down Performed on first and last piece of equipment    Resistance Training Performed Yes    VAD Patient? No    PAD/SET Patient? No      Pain Assessment   Currently in Pain? No/denies                Social History   Tobacco Use  Smoking Status Former   Packs/day: 1.00   Years: 48.00   Total pack years: 48.00   Types: Cigarettes   Quit date: 11/07/2015   Years since quitting: 6.1  Smokeless Tobacco Never    Goals Met:  Independence with exercise equipment Exercise tolerated well No report of concerns or symptoms today Strength training completed today  Goals Unmet:  Not Applicable  Comments: Pt able to follow exercise prescription today without complaint.  Will continue to monitor for progression.    Dr. Emily Filbert is Medical Director for [place].  Dr. Ottie Glazier is Medical Director for [facility] Pulmonary Rehabilitation.

## 2022-01-09 ENCOUNTER — Encounter: Payer: No Typology Code available for payment source | Admitting: *Deleted

## 2022-01-09 DIAGNOSIS — J849 Interstitial pulmonary disease, unspecified: Secondary | ICD-10-CM | POA: Diagnosis not present

## 2022-01-09 NOTE — Progress Notes (Signed)
Discharge Note  Brian Casey 1950-08-23    Brian Casey graduated today from  rehab with 36 sessions completed.  Details of the patient's exercise prescription and what Brian Casey needs to do in order to continue the prescription and progress were discussed with patient.  Patient was given a copy of prescription and goals.  Patient verbalized understanding.  Brian Casey plans to continue to exercise by walking at home  Brian Casey has been reminded to keep oxygen at level to maintain pulse oximetry at 88% or higher. .    6 Minute Walk     Row Name 09/29/21 1200 12/31/21 1027       6 Minute Walk   Phase Initial Discharge    Distance 420 feet 745 feet    Distance % Change -- 77 %    Distance Feet Change -- 325 ft    Walk Time 2.4 minutes  stopped at 2:24 due to desaturation to 78% 4.83 minutes    # of Rest Breaks 0 1    MPH 1.98 1.75    METS 1.26 1.93    RPE 13 12    Perceived Dyspnea  3 1    VO2 Peak 4.41 6.77    Symptoms Yes (comment) Yes (comment)    Comments SOB, desaturation (75% lowest) SOB    Resting HR 66 bpm 71 bpm    Resting BP 126/64 146/78    Resting Oxygen Saturation  95 % 98 %    Exercise Oxygen Saturation  during 6 min walk 78 %  once seated dropped as low as 75% 80 %    Max Ex. HR 93 bpm 98 bpm    Max Ex. BP 152/70 148/76    2 Minute Post BP 128/64 134/72      Interval HR   1 Minute HR 85 77    2 Minute HR 92 98    3 Minute HR 93 95    4 Minute HR -- 91    5 Minute HR -- 91    6 Minute HR -- 93    2 Minute Post HR 69 72    Interval Heart Rate? Yes Yes      Interval Oxygen   Interval Oxygen? Yes Yes    Baseline Oxygen Saturation % 95 % 98 %    1 Minute Oxygen Saturation % 88 % 96 %    1 Minute Liters of Oxygen 0 L  Room Air 6 L    2 Minute Oxygen Saturation % 82 % 90 %    2 Minute Liters of Oxygen 0 L 6 L    3 Minute Oxygen Saturation % 78 %  at 2:24 75% once seated 80 %    3 Minute Liters of Oxygen 0 L 6 L    4 Minute Oxygen Saturation % -- 93 %    4 Minute Liters of Oxygen  -- 6 L    5 Minute Oxygen Saturation % -- 92 %    5 Minute Liters of Oxygen -- 6 L    6 Minute Oxygen Saturation % -- 90 %    6 Minute Liters of Oxygen -- 6 L    2 Minute Post Oxygen Saturation % 89 % 99 %    2 Minute Post Liters of Oxygen 0 L 6 L            Thank you for the referral.

## 2022-01-09 NOTE — Progress Notes (Signed)
Daily Session Note  Patient Details  Name: Brian Casey MRN: [id number] Date of Birth: [birth year] Referring Provider:   April Manson Pulmonary Rehab from 09/29/2021 in [facility] Cardiac and Pulmonary Rehab  Referring Provider Cyndie Chime MD ([place])  [facility] Pulmonologist Dr. Lanney Gins       Encounter Date: 01/09/2022  Check In:  Session Check In - 01/09/22 1135       Check-In   Supervising physician immediately available to respond to emergencies See telemetry face sheet for immediately available ER MD    Location [facility]-Cardiac & Pulmonary Rehab    Staff Present Heath Lark, RN, BSN, CCRP;Jessica [place], MA, RCEP, CCRP, CCET;Joseph [place], [place]    Virtual Visit No    Medication changes reported     No    Fall or balance concerns reported    No    Warm-up and Cool-down Performed on first and last piece of equipment    Resistance Training Performed Yes    VAD Patient? No    PAD/SET Patient? No      Pain Assessment   Currently in Pain? No/denies                Social History   Tobacco Use  Smoking Status Former   Packs/day: 1.00   Years: 48.00   Total pack years: 48.00   Types: Cigarettes   Quit date: 11/07/2015   Years since quitting: 6.1  Smokeless Tobacco Never    Goals Met:  Proper associated with RPD/PD & O2 Sat Independence with exercise equipment Exercise tolerated well No report of concerns or symptoms today  Goals Unmet:  Not Applicable  Comments:  Brian Casey graduated today from  rehab with 36 sessions completed.  Details of the patient's exercise prescription and what He needs to do in order to continue the prescription and progress were discussed with patient.  Patient was given a copy of prescription and goals.  Patient verbalized understanding.  Brian Casey plans to continue to exercise by walking at home  Brian Casey has been reminded to keep oxygen at level to maintain pulse oximetry at 88% or higher. .    Dr. Emily Filbert is Medical Director for  [place].  Dr. Ottie Glazier is Medical Director for [facility] Pulmonary Rehabilitation.

## 2022-01-09 NOTE — Progress Notes (Signed)
Pulmonary Individual Treatment Plan  Patient Details  Name: Brian Casey MRN: [id number] Date of Birth: 02/24/1951 Referring Provider:   April Manson Pulmonary Rehab from 09/29/2021 in [facility] Cardiac and Pulmonary Rehab  Referring Provider Cyndie Chime [place] ([place])  [facility] Pulmonologist Dr. Lanney Gins       Initial Encounter Date:  Flowsheet Row Pulmonary Rehab from 09/29/2021 in [facility] Cardiac and Pulmonary Rehab  Date 09/29/21       Visit Diagnosis: ILD (interstitial lung disease) ([place])  Patient's Home Medications on Admission:  Current Outpatient Medications:    albuterol (VENTOLIN HFA) 108 (90 Base) MCG/ACT inhaler, , Disp: , Rfl:    ALPRAZolam (XANAX) 0.25 MG tablet, Take by mouth., Disp: , Rfl:    aspirin EC 81 MG tablet, Take 81 mg by mouth daily. Swallow whole., Disp: , Rfl:    atorvastatin (LIPITOR) 40 MG tablet, Take 40 mg by mouth daily., Disp: , Rfl:    budesonide (PULMICORT) 0.25 MG/2ML nebulizer solution, INHALE CONTENTS OF ONE VIAL/NEBULE VIA NEBULIZER TWO TIMES A DAY (Patient not taking: Reported on 11/05/2021), Disp: , Rfl:    carvedilol (COREG) 12.5 MG tablet, Take 12.5 mg by mouth 2 (two) times daily with a meal., Disp: , Rfl:    furosemide (LASIX) 20 MG tablet, Take 20 mg by mouth daily., Disp: , Rfl:    ipratropium-albuterol (DUONEB) 0.5-2.5 (3) MG/3ML SOLN, Take by nebulization., Disp: , Rfl:    levothyroxine (SYNTHROID) 100 MCG tablet, Take 100 mcg by mouth daily before breakfast., Disp: , Rfl:    levothyroxine (SYNTHROID, LEVOTHROID) 100 MCG tablet, Take 1 tablet (100 mcg total) by mouth daily before breakfast. OFFICE VISIT WITH  LABS REQUIRED FOR ADDITIONAL REFILLS (Patient not taking: Reported on 11/05/2021), Disp: 30 tablet, Rfl: 0   Nintedanib (OFEV) 150 MG CAPS, Take 1 capsule (150 mg total) by mouth 2 (two) times daily., Disp: 60 capsule, Rfl: 2   omeprazole (PRILOSEC) 20 MG capsule, Take 1 capsule (20 mg total) by mouth daily. OFFICE VISIT REQUIRED FOR  ADDITIONAL REFILLS, Disp: 30 capsule, Rfl: 0   predniSONE (DELTASONE) 10 MG tablet, Take 1 tablet (10 mg total) by mouth daily with breakfast., Disp: 90 tablet, Rfl: 0   tadalafil (CIALIS) 5 MG tablet, Take 1 tablet by mouth as needed., Disp: , Rfl:   Past Medical History: Past Medical History:  Diagnosis Date   Acute renal failure ([place]) 11/06/2015   [name] 11/06/2015   Adenomatous colon polyp    Anxiety    Arthritis    "lower spine; fingers" (11/07/2015)   Depression    Diverticulosis    GERD (gastroesophageal reflux disease)    High cholesterol    History of bronchitis    Hypertension 10/17/2013   Hyperthyroidism    "had it radiated in his '49s"   Hypothyroidism    ILD (interstitial lung disease) ([place])    New onset atrial fibrillation ([place]) 11/06/2015   [name] 11/07/2015   NSTEMI (non-ST elevated myocardial infarction) ([place]) 11/07/2015   Thyroid disease    Type II diabetes mellitus ([place])    type II   Wears glasses     Tobacco Use: Social History   Tobacco Use  Smoking Status Former   Packs/day: 1.00   Years: 48.00   Total pack years: 48.00   Types: Cigarettes   Quit date: 11/07/2015   Years since quitting: 6.1  Smokeless Tobacco Never    Labs: Review Flowsheet  More data exists      Latest Ref Rng &  Units 11/09/2015 11/13/2015 11/14/2015 11/15/2015 01/15/2016  Labs for ITP Cardiac and Pulmonary Rehab  Trlycerides <150 mg/dL 437  - - - -  Hemoglobin A1c 4.8 - 5.6 % - 7.1  - - 5.5   PH, Arterial 7.350 - 7.450 - 7.482  7.361  7.308  7.366  7.421  7.352  7.361  -  PCO2 arterial 35.0 - 45.0 mmHg - 32.2  40.3  52.3  38.2  39.5  38.4  39.3  -  Bicarbonate 20.0 - 24.0 mEq/L - 23.8  23.0  26.3  21.9  25.7  21.4  22.5  -  TCO2 0 - 100 mmol/L - 24._0 -  Acid-base deficit 0.0 - 2.0 mmol/L - - 2.0  3.0  4.0  3.0  -  O2 Saturation % - 97.1  97.0  100.0  99.0  100.0  61.7  95.0  98.0  -     Pulmonary Assessment  Scores:  Pulmonary Assessment Scores     Row Name 09/29/21 1351 01/05/22 1330       ADL UCSD   ADL Phase Entry --    SOB Score total 59 84    Rest 1 0    Walk 3 4    Stairs 5 5    Bath 2 4    Dress 2 4    Shop 2 5      CAT Score   CAT Score 23 20      mMRC Score   mMRC Score 2 3             UCSD: Self-administered rating of dyspnea associated with activities of daily living (ADLs) 6-point scale (0 = "not at all" to 5 = "maximal or unable to do because of breathlessness")  Scoring Scores range from 0 to 120.  Minimally important difference is 5 units  CAT: CAT can identify the health impairment of COPD patients and is better correlated with disease progression.  CAT has a scoring range of zero to 40. The CAT score is classified into four groups of low (less than 10), medium (10 - 20), high (21-30) and very high (31-40) based on the impact level of disease on health status. A CAT score over 10 suggests significant symptoms.  A worsening CAT score could be explained by an exacerbation, poor medication adherence, poor inhaler technique, or progression of COPD or comorbid conditions.  CAT MCID is 2 points  mMRC: mMRC (Modified Medical Research Council) Dyspnea Scale is used to assess the degree of baseline functional disability in patients of respiratory disease due to dyspnea. No minimal important difference is established. A decrease in score of 1 point or greater is considered a positive change.   Pulmonary Function Assessment:  Pulmonary Function Assessment - 09/29/21 1351       Breath   Shortness of Breath Yes;Fear of Shortness of Breath;Limiting activity             Exercise Target Goals: Exercise Program Goal: Individual exercise prescription set using results from initial 6 min walk test and THRR while considering  patient's activity barriers and safety.   Exercise Prescription Goal: Initial exercise prescription builds to 30-45 minutes a day of aerobic  activity, 2-3 days per week.  Home exercise guidelines will be given to patient during program as  part of exercise prescription that the participant will acknowledge.  Education: Aerobic Exercise: - Group verbal and visual presentation on the components of exercise prescription. Introduces F.I.T.T principle from ACSM for exercise prescriptions.  Reviews F.I.T.T. principles of aerobic exercise including progression. Written material given at graduation. Flowsheet Row Cardiac Rehab from 04/01/2016 in [facility] Cardiac and Pulmonary Rehab  Date 02/17/16  Educator [facility]  Instruction Review Code (retired) 2- meets goals/outcomes       Education: Resistance Exercise: - Group verbal and visual presentation on the components of exercise prescription. Introduces F.I.T.T principle from ACSM for exercise prescriptions  Reviews F.I.T.T. principles of resistance exercise including progression. Written material given at graduation. Flowsheet Row Pulmonary Rehab from 12/17/2021 in [facility] Cardiac and Pulmonary Rehab  Date 12/03/21  Educator NT  Instruction Review Code 1- [employer] Understanding        Education: Exercise & Equipment Safety: - Individual verbal instruction and demonstration of equipment use and safety with use of the equipment. Flowsheet Row Pulmonary Rehab from 12/17/2021 in [facility] Cardiac and Pulmonary Rehab  Date 09/29/21  Educator [facility]  Instruction Review Code 1- Verbalizes Understanding       Education: Exercise Physiology & General Exercise Guidelines: - Group verbal and written instruction with models to review the exercise physiology of the cardiovascular system and associated critical values. Provides general exercise guidelines with specific guidelines to those with heart or lung disease.    Education: Flexibility, Balance, Mind/Body Relaxation: - Group verbal and visual presentation with interactive activity on the components of exercise prescription. Introduces F.I.T.T principle from  ACSM for exercise prescriptions. Reviews F.I.T.T. principles of flexibility and balance exercise training including progression. Also discusses the mind body connection.  Reviews various relaxation techniques to help reduce and manage stress (i.e. Deep breathing, progressive muscle relaxation, and visualization). Balance handout provided to take home. Written material given at graduation. Flowsheet Row Pulmonary Rehab from 12/17/2021 in [facility] Cardiac and Pulmonary Rehab  Date 12/03/21  Educator NT  Instruction Review Code 1- Verbalizes Understanding       Activity Barriers & Risk Stratification:  Activity Barriers & Cardiac Risk Stratification - 09/29/21 1330       Activity Barriers & Cardiac Risk Stratification   Activity Barriers Shortness of Breath;Deconditioning;Muscular Weakness;Balance Concerns             6 Minute Walk:  6 Minute Walk     Row Name 09/29/21 1200 12/31/21 1027       6 Minute Walk   Phase Initial Discharge    Distance 420 feet 745 feet    Distance % Change -- 77 %    Distance Feet Change -- 325 ft    Walk Time 2.4 minutes  stopped at 2:24 due to desaturation to 78% 4.83 minutes    # of Rest Breaks 0 1    MPH 1.98 1.75    METS 1.26 1.93    RPE 13 12    Perceived Dyspnea  3 1    VO2 Peak 4.41 6.77    Symptoms Yes (comment) Yes (comment)    Comments SOB, desaturation (75% lowest) SOB    Resting HR 66 bpm 71 bpm    Resting BP 126/64 146/78    Resting Oxygen Saturation  95 % 98 %    Exercise Oxygen Saturation  during 6 min walk 78 %  once seated dropped as low as 75% 80 %    Max Ex. HR 93 bpm 98 bpm    Max Ex. BP  152/70 148/76    2 Minute Post BP 128/64 134/72      Interval HR   1 Minute HR 85 77    2 Minute HR 92 98    3 Minute HR 93 95    4 Minute HR -- 91    5 Minute HR -- 91    6 Minute HR -- 93    2 Minute Post HR 69 72    Interval Heart Rate? Yes Yes      Interval Oxygen   Interval Oxygen? Yes Yes    Baseline Oxygen Saturation % 95 %  98 %    1 Minute Oxygen Saturation % 88 % 96 %    1 Minute Liters of Oxygen 0 L  Room Air 6 L    2 Minute Oxygen Saturation % 82 % 90 %    2 Minute Liters of Oxygen 0 L 6 L    3 Minute Oxygen Saturation % 78 %  at 2:24 75% once seated 80 %    3 Minute Liters of Oxygen 0 L 6 L    4 Minute Oxygen Saturation % -- 93 %    4 Minute Liters of Oxygen -- 6 L    5 Minute Oxygen Saturation % -- 92 %    5 Minute Liters of Oxygen -- 6 L    6 Minute Oxygen Saturation % -- 90 %    6 Minute Liters of Oxygen -- 6 L    2 Minute Post Oxygen Saturation % 89 % 99 %    2 Minute Post Liters of Oxygen 0 L 6 L            Oxygen Initial Assessment:  Oxygen Initial Assessment - 09/29/21 1350       Home Oxygen   Home Oxygen Device None    Sleep Oxygen Prescription None    Home Exercise Oxygen Prescription None    Home Resting Oxygen Prescription None    Compliance with Home Oxygen Use Yes      Initial 6 min Walk   Oxygen Used None      Program Oxygen Prescription   Program Oxygen Prescription Continuous;E-Tanks    Liters per minute 2    Comments order sent to MD for continous use      Intervention   Short Term Goals To learn and demonstrate proper pursed lip breathing techniques or other breathing techniques. ;To learn and understand importance of monitoring SPO2 with pulse oximeter and demonstrate accurate use of the pulse oximeter.;To learn and demonstrate proper use of respiratory medications;To learn and exhibit compliance with exercise, home and travel O2 prescription;To learn and understand importance of maintaining oxygen saturations>88%    Long  Term Goals Exhibits proper breathing techniques, such as pursed lip breathing or other method taught during program session;Demonstrates proper use of MDI's;Compliance with respiratory medication;Exhibits compliance with exercise, home  and travel O2 prescription;Verbalizes importance of monitoring SPO2 with pulse oximeter and return  demonstration;Maintenance of O2 saturations>88%             Oxygen Re-Evaluation:  Oxygen Re-Evaluation     Row Name 10/13/21 1109 10/27/21 1121 11/12/21 1139 12/12/21 1024 01/07/22 1131     Program Oxygen Prescription   Program Oxygen Prescription -- Continuous;E-Tanks Continuous;E-Tanks Continuous;E-Tanks Continuous;E-Tanks   Liters per minute -- _0 Comments -- On oxygen for exercise. On oxygen for exercise. -- On oxygen for exercise.     Home Oxygen  Home Oxygen Device -- None  Will ask MD at appt 8/15 None Home Concentrator;E-Tanks Home Concentrator;E-Tanks   Sleep Oxygen Prescription -- None None None None   Home Exercise Oxygen Prescription -- None  Asking MD about prescription Continuous Continuous Continuous   Liters per minute -- -- _0 Home Resting Oxygen Prescription -- None None None None   Compliance with Home Oxygen Use -- Yes Yes Yes Yes     Goals/Expected Outcomes   Short Term Goals -- To learn and demonstrate proper pursed lip breathing techniques or other breathing techniques. ;To learn and understand importance of monitoring SPO2 with pulse oximeter and demonstrate accurate use of the pulse oximeter.;To learn and demonstrate proper use of respiratory medications;To learn and exhibit compliance with exercise, home and travel O2 prescription;To learn and understand importance of maintaining oxygen saturations>88% To learn and demonstrate proper pursed lip breathing techniques or other breathing techniques. ;To learn and understand importance of monitoring SPO2 with pulse oximeter and demonstrate accurate use of the pulse oximeter.;To learn and demonstrate proper use of respiratory medications;To learn and exhibit compliance with exercise, home and travel O2 prescription;To learn and understand importance of maintaining oxygen saturations>88% Other;To learn and exhibit compliance with exercise, home and travel O2 prescription;To learn and understand importance  of monitoring SPO2 with pulse oximeter and demonstrate accurate use of the pulse oximeter.;To learn and understand importance of maintaining oxygen saturations>88%;To learn and demonstrate proper pursed lip breathing techniques or other breathing techniques. ;To learn and demonstrate proper use of respiratory medications Other;To learn and exhibit compliance with exercise, home and travel O2 prescription;To learn and understand importance of monitoring SPO2 with pulse oximeter and demonstrate accurate use of the pulse oximeter.;To learn and understand importance of maintaining oxygen saturations>88%;To learn and demonstrate proper pursed lip breathing techniques or other breathing techniques. ;To learn and demonstrate proper use of respiratory medications   Long  Term Goals -- Exhibits proper breathing techniques, such as pursed lip breathing or other method taught during program session;Demonstrates proper use of MDI's;Compliance with respiratory medication;Exhibits compliance with exercise, home  and travel O2 prescription;Verbalizes importance of monitoring SPO2 with pulse oximeter and return demonstration;Maintenance of O2 saturations>88% Exhibits proper breathing techniques, such as pursed lip breathing or other method taught during program session;Demonstrates proper use of MDI's;Compliance with respiratory medication;Exhibits compliance with exercise, home  and travel O2 prescription;Verbalizes importance of monitoring SPO2 with pulse oximeter and return demonstration;Maintenance of O2 saturations>88% Other;Exhibits proper breathing techniques, such as pursed lip breathing or other method taught during program session;Maintenance of O2 saturations>88%;Verbalizes importance of monitoring SPO2 with pulse oximeter and return demonstration;Compliance with respiratory medication;Exhibits compliance with exercise, home  and travel O2 prescription;Demonstrates proper use of MDI's Other;Exhibits proper breathing  techniques, such as pursed lip breathing or other method taught during program session;Maintenance of O2 saturations>88%;Verbalizes importance of monitoring SPO2 with pulse oximeter and return demonstration;Compliance with respiratory medication;Exhibits compliance with exercise, home  and travel O2 prescription;Demonstrates proper use of MDI's   Comments Reviewed PLB technique with pt.  Talked about how it works and it's importance in maintaining their exercise saturations. Beniah does not have oxygen for home yet. EP did not go over home exercise at this time due to wanting home O2 prescription first. He is compliant with it as rehab and responds to it well. He did confirm that he has a pulse ox at home but does not check his O2 while during activity. I encouraged him to start that immediately and  to note and stop when he is below 88%. He has been practicing PLB since he learned it his firts day at rehab and feels it helps him tremendously. Shareef is up to 4L of oxygen with exercise. His O2 saturation has been dropping very quickly with exercise. He has been practicing PLB since learning it in rehab and feels that it has helped. He has been checking his O2 at home using his personal pulse ox. He has a biopsy later this week to specifically diagnose his ILD. Larrys doctor cancelled his biopsy. He said that his doctors are treating his Ofev. He is wearing his oxygen like he is supposed to at home. He is checking his pulse oximeter at home and knows he needs to be 88 percent and above. He is doing better with PLB and is praticing at home. Nebulizers are schedules as needed for his breathing and takes them seldome. Caston contineus to use his PLB.  He has gotten better with using his oxygen for activity.  He is using his nebulizer more. He is learning to listen to his body and rest when needed.   Goals/Expected Outcomes Short: Become more profiecient at using PLB.   Long: Become independent at using PLB. Short: Start  checking O2 during activity at home, stop and PLB at 88% or low/ talk to doc about home o2 prescription Long: Become efficient at PLB Short: Continue to use pulse ox to check O2 at home. Long: Become more efficient at PLB. Short: practice PLB and diaphragmatic breathing at home. Long: Use PLB and diaphragmatic breathing independently post [employer]. Continued compliance            Oxygen Discharge (Final Oxygen Re-Evaluation):  Oxygen Re-Evaluation - 01/07/22 1131       Program Oxygen Prescription   Program Oxygen Prescription Continuous;E-Tanks    Liters per minute 4    Comments On oxygen for exercise.      Home Oxygen   Home Oxygen Device Home Concentrator;E-Tanks    Sleep Oxygen Prescription None    Home Exercise Oxygen Prescription Continuous    Liters per minute 4    Home Resting Oxygen Prescription None    Compliance with Home Oxygen Use Yes      Goals/Expected Outcomes   Short Term Goals Other;To learn and exhibit compliance with exercise, home and travel O2 prescription;To learn and understand importance of monitoring SPO2 with pulse oximeter and demonstrate accurate use of the pulse oximeter.;To learn and understand importance of maintaining oxygen saturations>88%;To learn and demonstrate proper pursed lip breathing techniques or other breathing techniques. ;To learn and demonstrate proper use of respiratory medications    Long  Term Goals Other;Exhibits proper breathing techniques, such as pursed lip breathing or other method taught during program session;Maintenance of O2 saturations>88%;Verbalizes importance of monitoring SPO2 with pulse oximeter and return demonstration;Compliance with respiratory medication;Exhibits compliance with exercise, home  and travel O2 prescription;Demonstrates proper use of MDI's    Comments Kalmen contineus to use his PLB.  He has gotten better with using his oxygen for activity.  He is using his nebulizer more. He is learning to listen to his body  and rest when needed.    Goals/Expected Outcomes Continued compliance             Initial Exercise Prescription:  Initial Exercise Prescription - 09/29/21 1300       Date of Initial Exercise RX and Referring Provider   Date 09/29/21    Referring Provider Cyndie Chime MD ([place])  Primary Pulmonologist Dr. Lanney Gins     Oxygen   Oxygen Continuous    Liters 2    Maintain Oxygen Saturation 88% or higher      REL-XR   Level 1    Speed 50    Minutes 15    METs 2      Biostep-RELP   Level 2    SPM 50    Minutes 15    METs 2      Track   Laps 25    Minutes 15    METs 2.36      Prescription Details   Frequency (times per week) 3    Duration Progress to 30 minutes of continuous aerobic without signs/symptoms of physical distress      Intensity   THRR 40-80% of Max Heartrate 100-134    Ratings of Perceived Exertion 11-13    Perceived Dyspnea 0-4      Progression   Progression Continue to progress workloads to maintain intensity without signs/symptoms of physical distress.      Resistance Training   Training Prescription Yes    Weight 3 lb    Reps 10-15             Perform Capillary Blood Glucose checks as needed.  Exercise Prescription Changes:   Exercise Prescription Changes     Row Name 09/29/21 1300 10/22/21 0700 11/03/21 0900 11/17/21 1400 12/03/21 0700     Response to Exercise   Blood Pressure (Admit) 126/64 142/80 106/64 132/62 124/82   Blood Pressure (Exercise) 152/70 160/80 148/68 128/64 --   Blood Pressure (Exit) 162/70  rck 142/74 130/78 120/70 126/72 114/60   Heart Rate (Admit) 66 bpm 64 bpm 67 bpm 74 bpm 67 bpm   Heart Rate (Exercise) 93 bpm 112 bpm 93 bpm 110 bpm 90 bpm   Heart Rate (Exit) 68 bpm 81 bpm 75 bpm 77 bpm 71 bpm   Oxygen Saturation (Admit) 95 % 97 % 97 % 90 % 98 %   Oxygen Saturation (Exercise) 75 % 88 % 89 % 91 % 88 %   Oxygen Saturation (Exit) 92 % 94 % 95 % 95 % 98 %   Rating of Perceived Exertion (Exercise) _0 Perceived Dyspnea (Exercise) _1 Symptoms SOB, desaturation SOB SOB SOB SOB   Comments walk test results 1st full day of exercise -- -- --   Duration -- Progress to 30 minutes of  aerobic without signs/symptoms of physical distress Continue with 30 min of aerobic exercise without signs/symptoms of physical distress. Continue with 30 min of aerobic exercise without signs/symptoms of physical distress. Continue with 30 min of aerobic exercise without signs/symptoms of physical distress.   Intensity -- THRR unchanged THRR unchanged THRR unchanged THRR unchanged     Progression   Progression -- Continue to progress workloads to maintain intensity without signs/symptoms of physical distress. Continue to progress workloads to maintain intensity without signs/symptoms of physical distress. Continue to progress workloads to maintain intensity without signs/symptoms of physical distress. Continue to progress workloads to maintain intensity without signs/symptoms of physical distress.   Average METs -- 2.16 2.69 2.12 2.45     Resistance Training   Training Prescription -- Yes Yes Yes Yes   Weight -- 3 lb 3 lb 3 lb 3 lb   Reps -- 10-15 10-15 10-15 10-15     Interval Training   Interval Training -- No No No No  Oxygen   Oxygen -- Continuous Continuous Continuous Continuous   Liters -- _0 NuStep   Level -- 1 2 -- --   Minutes -- 15 15 -- --   METs -- 2.8 3.3 -- --     Arm Ergometer   Level -- 1 1 -- --   Minutes -- 15 15 -- --   METs -- 2.3 2.17 -- --     REL-XR   Level -- _1 Minutes -- _2 METs -- 2.8 2.8 2.5 2.5     Biostep-RELP   Level -- _3 Minutes -- _4 METs -- _5 Track   Laps -- _6 Minutes -- _7 METs -- 1.54 1.87 1.87 1.92     Oxygen   Maintain Oxygen Saturation -- 88% or higher -- 88% or higher 88% or higher    Row Name 12/16/21 1500 12/29/21 1500 01/07/22 1100         Response  to Exercise   Blood Pressure (Admit) 118/68 122/68 122/68     Blood Pressure (Exit) 110/60 122/64 122/64     Heart Rate (Admit) 75 bpm 72 bpm 72 bpm     Heart Rate (Exercise) 93 bpm 114 bpm 114 bpm     Heart Rate (Exit) 78 bpm 80 bpm 80 bpm     Oxygen Saturation (Admit) 93 % 98 % 98 %     Oxygen Saturation (Exercise) 88 % 83 % 83 %     Oxygen Saturation (Exit) 96 % 94 % 94 %     Rating of Perceived Exertion (Exercise) _8 Perceived Dyspnea (Exercise) _9 Symptoms SOB SOB SOB     Duration Continue with 30 min of aerobic exercise without signs/symptoms of physical distress. Continue with 30 min of aerobic exercise without signs/symptoms of physical distress. Continue with 30 min of aerobic exercise without signs/symptoms of physical distress.     Intensity THRR unchanged THRR unchanged THRR unchanged       Progression   Progression Continue to progress workloads to maintain intensity without signs/symptoms of physical distress. Continue to progress workloads to maintain intensity without signs/symptoms of physical distress. Continue to progress workloads to maintain intensity without signs/symptoms of physical distress.     Average METs 2.46 1.95 1.95       Resistance Training   Training Prescription Yes Yes Yes     Weight 3 lb 3 lb 3 lb     Reps 10-15 10-15 10-15       Interval Training   Interval Training No No No       Oxygen   Oxygen Continuous Continuous Continuous     Liters _10 NuStep   Level 2 -- --     Minutes 15 -- --     METs 3.3 -- --       Arm Ergometer   Level -- 2 2     Minutes -- 15 15     METs -- 2.3 2.3       REL-XR   Level _11 Minutes _12 METs 2.5 -- --  Biostep-RELP   Level -- 3 3     Minutes -- 15 15     METs -- 3 3       Track   Laps _0 Minutes _1 METs 1.87 1.98 1.98       Home Exercise Plan   Plans to continue exercise at -- -- Home (comment)  walking, yard work     Frequency  -- -- Add 3 additional days to program exercise sessions.     Initial Home Exercises Provided -- -- 01/07/22       Oxygen   Maintain Oxygen Saturation 88% or higher 88% or higher 88% or higher              Exercise Comments:   Exercise Comments     Row Name 01/09/22 1137           Exercise Comments Faustino graduated today from  rehab with 36 sessions completed.  Details of the patient's exercise prescription and what He needs to do in order to continue the prescription and progress were discussed with patient.  Patient was given a copy of prescription and goals.  Patient verbalized understanding.  Castor plans to continue to exercise by walking at home  Kaeleb has been reminded to keep oxygen at level to maintain pulse oximetry at 88% or higher. .                Exercise Goals and Review:   Exercise Goals     Row Name 09/29/21 1348             Exercise Goals   Increase Physical Activity Yes       Intervention Provide advice, education, support and counseling about physical activity/exercise needs.;Develop an individualized exercise prescription for aerobic and resistive training based on initial evaluation findings, risk stratification, comorbidities and participant's personal goals.       Expected Outcomes Short Term: Attend rehab on a regular basis to increase amount of physical activity.;Long Term: Add in home exercise to make exercise part of routine and to increase amount of physical activity.;Long Term: Exercising regularly at least 3-5 days a week.       Increase Strength and Stamina Yes       Intervention Provide advice, education, support and counseling about physical activity/exercise needs.;Develop an individualized exercise prescription for aerobic and resistive training based on initial evaluation findings, risk stratification, comorbidities and participant's personal goals.       Expected Outcomes Short Term: Increase workloads from initial exercise prescription  for resistance, speed, and METs.;Short Term: Perform resistance training exercises routinely during rehab and add in resistance training at home;Long Term: Improve cardiorespiratory fitness, muscular endurance and strength as measured by increased METs and functional capacity (6MWT)       Able to understand and use rate of perceived exertion (RPE) scale Yes       Intervention Provide education and explanation on how to use RPE scale       Expected Outcomes Short Term: Able to use RPE daily in rehab to express subjective intensity level       Able to understand and use Dyspnea scale Yes       Intervention Provide education and explanation on how to use Dyspnea scale       Expected Outcomes Short Term: Able to use Dyspnea scale daily in rehab to express subjective sense of shortness of breath during exertion;Long Term:  Able to use Dyspnea scale to guide intensity level when exercising independently       Knowledge and understanding of Target Heart Rate Range (THRR) Yes       Intervention Provide education and explanation of THRR including how the numbers were predicted and where they are located for reference       Expected Outcomes Short Term: Able to state/look up THRR;Short Term: Able to use daily as guideline for intensity in rehab;Long Term: Able to use THRR to govern intensity when exercising independently       Able to check pulse independently Yes       Intervention Provide education and demonstration on how to check pulse in carotid and radial arteries.;Review the importance of being able to check your own pulse for safety during independent exercise       Expected Outcomes Long Term: Able to check pulse independently and accurately;Short Term: Able to explain why pulse checking is important during independent exercise       Understanding of Exercise Prescription Yes       Intervention Provide education, explanation, and written materials on patient's individual exercise prescription        Expected Outcomes Short Term: Able to explain program exercise prescription;Long Term: Able to explain home exercise prescription to exercise independently                Exercise Goals Re-Evaluation :  Exercise Goals Re-Evaluation     Row Name 10/13/21 1107 10/22/21 0723 10/27/21 1117 11/03/21 0920 11/12/21 1120     Exercise Goal Re-Evaluation   Exercise Goals Review Increase Physical Activity;Able to understand and use rate of perceived exertion (RPE) scale;Knowledge and understanding of Target Heart Rate Range (THRR);Understanding of Exercise Prescription;Increase Strength and Stamina;Able to check pulse independently;Able to understand and use Dyspnea scale Increase Physical Activity;Increase Strength and Stamina;Understanding of Exercise Prescription Increase Physical Activity;Increase Strength and Stamina;Understanding of Exercise Prescription Increase Physical Activity;Increase Strength and Stamina;Understanding of Exercise Prescription Increase Physical Activity;Increase Strength and Stamina;Understanding of Exercise Prescription   Comments Reviewed RPE and dyspnea scales, THR and program prescription with pt today.  Pt voiced understanding and was given a copy of goals to take home. Cannan is off to a good start in rehab. He had an average overall MET level of 2.16 METs. He did well with the biostep on level 2 as well. He also got 10 laps on the track, so we will tell him to push for more. We will continue to monitor his progress in the program. Jabin is doing some walking at home, but nothing structured too much. He does not have an O2 oxygen prescription for home yet. He is still talking with doctor about it and has an appointment with his Pulmonologist on 8/15. EP will hold off on going over home exercise until the oxygen prescription is established. We talked about having him use his own pulse ox to check during his activity at home, stop/rest/PLB when needed and when oxygen is below 88%.  He plans to keep walking at home for his home exercise plan. Alphonzo is doing well in rehab. He does desat sometimes during his exercise, and is now using up to 4L of O2 some sessions. He stays above 88%. We have contacted his doctor about getting an oxygen prescription update.  He was able to complete 16 laps on the track and increased to level 2 on the T4.  We will continue to monitor. Zephaniah is doing well in rehab. He has  experienced a lot of SOB with exercise which he is frustrated with. He is walking at rehab but has to stop frequently to get his O2 saturation to rise back to normal levels. He is also doing seated equipment and doing well with it. 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 We will continue to monitor his progress in the program.   Expected Outcomes Short: Use RPE daily to regulate intensity. Long: Follow program prescription in THR. Short: Continue to push for more laps on the track. Long: Continue to increase strength and stamina. Short: EP to go over home exercise, check O2 at home diligently Long: Exercise independently at home at appropriate prescription Short: Continue to watch O2 closely, increase laps on track if O2 allows Long: Continue to increase overall MET level Short: Continue to watch O2 closely, increase laps on track if O2 allows Long: Continue to increase overall MET level    Row Name 11/17/21 1359 12/03/21 0758 12/16/21 1523 12/29/21 1514 01/07/22 1122     Exercise Goal Re-Evaluation   Exercise Goals Review Increase Physical Activity;Increase Strength and Stamina;Understanding of Exercise Prescription Increase Physical Activity;Increase Strength and Stamina;Understanding of Exercise Prescription Increase Physical Activity;Increase Strength and Stamina;Understanding of Exercise Prescription Increase Physical Activity;Increase Strength and Stamina;Understanding of Exercise Prescription  Increase Physical Activity;Increase Strength and Stamina;Understanding of Exercise Prescription;Knowledge and understanding of Target Heart Rate Range (THRR);Able to understand and use rate of perceived exertion (RPE) scale;Able to check pulse independently;Able to understand and use Dyspnea scale   Comments Trygve is doing well in rehab.  He is now on home oxgen therapy for activity!  He did request to hold off on home exercise review until her gets used to home oxygen and gets through a few more appoinments.  He did bring his oxygen in with him last week, but would forget to turn it back to come into class after rest in waiting area.  He was encouraged to keep in mind to turn it back on anytime he is moving.  He is holding at 16 laps and still needs to rest after two to three laps.  We will conitnue to monitor his progress. Kimber is doing well in rehab. He recently increased his oxygen from 4 lpm to 6 lpm. He also improved to walking 17 laps on the track. He improved to level 3 on the XR as well. We will continue to monitor his progress in the program. Gorman has been coming consistent to rehab. Max laps on track still averages around 16, if he drops below 88% he is aware to stop and PLB, which he already does frequently. We hope to see him able to minimize the amount of stops during his exercise, while maintaining appropriate RPEs and a high O2 saturation. He workd up to 3.3  METS on the T4 Nustep. Will continue to monitor Marcellas has been doing well in rehab. He recently improved to 18 laps on the track! He also began using the arm crank again and did well on there. He improved to level 3 on the biostep as well. We will continue to monitor his progress in the program. Blaike is getting ready to graduate.  We reviewed home exercise breifly and talked about difference between physical activity and exercise walking.  He is doing better about using his oxygen for activity.  He is learning his limits.   Expected  Outcomes Short:  Continue to use O2 more at home, review home exercise guidelines Long: Continue to improve stamina Short: Continue to increase laps on the track. Long: Continue to improve strength and stamina. Short: Minimize laps on track considering breathing and O2 saturation Long: Continue to increase overall MET level Short: Minimize laps on track considering breathing and O2 saturation Long: Continue to increase strength and stamina. Continue to exercise independently at home            Discharge Exercise Prescription (Final Exercise Prescription Changes):  Exercise Prescription Changes - 01/07/22 1100       Response to Exercise   Blood Pressure (Admit) 122/68    Blood Pressure (Exit) 122/64    Heart Rate (Admit) 72 bpm    Heart Rate (Exercise) 114 bpm    Heart Rate (Exit) 80 bpm    Oxygen Saturation (Admit) 98 %    Oxygen Saturation (Exercise) 83 %    Oxygen Saturation (Exit) 94 %    Rating of Perceived Exertion (Exercise) 13    Perceived Dyspnea (Exercise) 3    Symptoms SOB    Duration Continue with 30 min of aerobic exercise without signs/symptoms of physical distress.    Intensity THRR unchanged      Progression   Progression Continue to progress workloads to maintain intensity without signs/symptoms of physical distress.    Average METs 1.95      Resistance Training   Training Prescription Yes    Weight 3 lb    Reps 10-15      Interval Training   Interval Training No      Oxygen   Oxygen Continuous    Liters 6      Arm Ergometer   Level 2    Minutes 15    METs 2.3      REL-XR   Level 1    Minutes 15      Biostep-RELP   Level 3    Minutes 15    METs 3      Track   Laps 18    Minutes 15    METs 1.98      Home Exercise Plan   Plans to continue exercise at Home (comment)   walking, yard work   Frequency Add 3 additional days to program exercise sessions.    Initial Home Exercises Provided 01/07/22      Oxygen   Maintain Oxygen Saturation 88%  or higher             Nutrition:  Target Goals: Understanding of nutrition guidelines, daily intake of sodium <151m, cholesterol <2084m calories 30% from fat and 7% or less from saturated fats, daily to have 5 or more servings of fruits and vegetables.  Education: All About Nutrition: -Group instruction provided by verbal, written material, interactive activities, discussions, models, and posters to present general guidelines for heart healthy nutrition including fat, fiber, MyPlate, the role of sodium in heart healthy nutrition, utilization of the nutrition label, and utilization of this knowledge for meal planning. Follow up email sent as well. Written material given at graduation. [facility]  Education need identified 09/29/21  Date 12/17/21  [facility] Code 1- Verbalizes Understanding       Biometrics:  Pre Biometrics - 09/29/21 1348       Pre Biometrics   Height 5' 7.6" (1.717 m)    Weight 206 lb (93.4 kg)    BMI (Calculated)  31.7    Single Leg Stand 2.8 seconds             Post Biometrics - 12/31/21 1036        Post  Biometrics   Height 5' 7.6" (1.717 m)    Weight 196 lb 14.4 oz (89.3 kg)    BMI (Calculated) 30.3             Nutrition Therapy Plan and Nutrition Goals:  Nutrition Therapy & Goals - 11/03/21 1209       Nutrition Therapy   RD appointment deferred Yes   Kamali would like to defer a nutrition appt. due to his schedule and having many appointments coming up. Will continue to follow up.     Personal Nutrition Goals   Nutrition Goal Jakyle would like to defer a nutrition appt. due to his schedule and having many appointments coming up. Will continue to follow up.             Nutrition Assessments:  MEDIFICTS Score Key: ?70 Need to make dietary changes  40-70 Heart Healthy Diet ? 40 Therapeutic Level Cholesterol Diet  Flowsheet Row  Pulmonary Rehab from 01/05/2022 in [facility] Cardiac [facility]monary Rehab  Picture Your Plate Total Score on Discharge 56      Picture Your Plate Scores: <85 Unhealthy dietary pattern with much room for improvement. 41-50 Dietary pattern unlikely to meet recommendations for good health and room for improvement. 51-60 More healthful dietary pattern, with some room for improvement.  >60 Healthy dietary pattern, although there may be some specific behaviors that could be improved.   Nutrition Goals Re-Evaluation:  Nutrition Goals Re-Evaluation     Stanley Name 10/27/21 1336 12/12/21 1032 01/07/22 1132         Goals   Current Weight -- 197 lb (89.4 kg) --     Nutrition Goal -- Maintain eating smaller meals. Short: Choose and plan snacks accordingly to patients caloric intake to improve breathing. Long: Maintain a diet independently that meets their caloric intake to aid in daily shortness of breath.     Comment Qasim has yet to meet with the RD. Patient was informed on why it is important to maintain a balanced diet when dealing with Respiratory issues. Explained that it takes a lot of energy to breath and when they are short of breath often they will need to have a good diet to help keep up with the calories they are expending for breathing. Fabiano is doing well in rehab.  They are working on changing meds which have caused some digestional issues.  He is doing better about eating variety thanks to his wife. He is also eating breatkfast routinely     Expected Outcome Short: Make appt with RD and attend Long: Follow RD goals long-term Short: Choose and plan snacks accordingly to patients caloric intake to improve breathing. Long: Maintain a diet independently that meets their caloric intake to aid in daily shortness of breath. Continue to focus on healthy eating.              Nutrition Goals Discharge (Final Nutrition Goals Re-Evaluation):  Nutrition Goals Re-Evaluation - 01/07/22 1132       Goals    Nutrition Goal Short: Choose and plan snacks accordingly to patients caloric intake to improve breathing. Long: Maintain a diet independently that meets their caloric intake to aid in daily shortness of breath.    Comment Antawn is doing well in rehab.  They are working on changing  meds which have caused some digestional issues.  He is doing better about eating variety thanks to his wife. He is also eating breatkfast routinely    Expected Outcome Continue to focus on healthy eating.             Psychosocial: Target Goals: Acknowledge presence or absence of significant depression and/or stress, maximize coping skills, provide positive support system. Participant is able to verbalize types and ability to use techniques and skills needed for reducing stress and depression.   Education: Stress, Anxiety, and Depression - Group verbal and visual presentation to define topics covered.  Reviews how body is impacted by stress, anxiety, and depression.  Also discusses healthy ways to reduce stress and to treat/manage anxiety and depression.  Written material given at graduation. Flowsheet Row Cardiac Rehab from 04/01/2016 in [facility] Cardiac and Pul[facility] Rehab  Date 04/01/16  Educator [facility]  Instruction Review Code (retired) 2- meets goals/outcomes       Education: Sleep Hygiene -Provides group verbal and written instruction about how sleep can affect your health.  Define sleep hygiene, discuss sleep cycles and impact of sleep habits. Review good sleep hygiene tips.    Initial Review & Psychosocial Screening:  Initial Psych Review & Screening - 09/12/21 1421       Initial Review   Current issues with Current Sleep Concerns;Current Psychotropic Meds;Current Stress Concerns    Source of Stress Concerns Chronic Illness;Unable to participate in former interests or hobbies    Comments THis illness is not me. I can not do what I used to do in the past. I am stopped now from participating in what I used  to do.      Family Dynamics   Good Support System? Yes   wife, children daughter and son     Barriers   Psychosocial barriers to participate in program There are no identifiable barriers or psychosocial needs.      Screening Interventions   Interventions Encouraged to exercise    Expected Outcomes Short Term goal: Utilizing psychosocial counselor, staff and physician to assist with identification of specific Stressors or current issues interfering with healing process. Setting desired goal for each stressor or current issue identified.;Long Term Goal: Stressors or current issues are controlled or eliminated.;Short Term goal: Identification and review with participant of any Quality of Life or Depression concerns found by scoring the questionnaire.;Long Term goal: The participant improves quality of Life and PHQ9 Scores as seen by post scores and/or verbalization of changes             Quality of Life Scores:  Scores of 19 and below usually indicate a poorer quality of life in these areas.  A difference of  2-3 points is a clinically meaningful difference.  A difference of 2-3 points in the total score of the Quality of Life Index has been associated with significant improvement in overall quality of life, self-image, physical symptoms, and general health in studies assessing change in quality of life.  PHQ-9: Review Flowsheet  More data may exist      01/05/2022 12/12/2021 09/29/2021 03/25/2016 02/10/2016  Depression screen PHQ 2/9  Decreased Interest 3 2 0 0 1  Down, Depressed, Hopeless _0 0 0  PHQ - 2 Score _1 0 1  Altered sleeping _2 0  Tired, decreased energy _3 0 0  Change in appetite _4 0 1  Feeling bad or failure about yourself  2 0  1 0 1  Trouble concentrating 1 0 0 0 0  Moving slowly or fidgety/restless 1 0 0 0 0  Suicidal thoughts 0 0 0 0 0  PHQ-9 Score _0 Difficult doing work/chores Not difficult at all Somewhat difficult Somewhat difficult Not  difficult at all Not difficult at all   Interpretation of Total Score  Total Score Depression Severity:  1-4 = Minimal depression, 5-9 = Mild depression, 10-14 = Moderate depression, 15-19 = Moderately severe depression, 20-27 = Severe depression   Psychosocial Evaluation and Intervention:  Psychosocial Evaluation - 09/12/21 1428       Psychosocial Evaluation & Interventions   Interventions Encouraged to exercise with the program and follow exercise prescription    Comments Castulo has no barriers to attending the program. He lives with his wife and she is his "advocate". She is with him at all his appointments. THey have a daughter and son that are support too.  Tunis wants to be able to be more active with less shortness of breath. He is streeesd some over the fact he is not able to participate in activities that he enjoys.  He is ready to get started with the program.    Expected Outcomes STG Cassie is able to attend all scheduled sessions, he is able to learn and use breathing techniques and mange his pulmonary meds to help decrease his symptoms. He has been in the Cardiac Rehab program in the past. He has quit tobacco, 2017, and has no desire to start using any form of tobacco.  He is ready to get started.    Continue Psychosocial Services  Follow up required by staff             Psychosocial Re-Evaluation:  Psychosocial Re-Evaluation     Paisley Name 10/27/21 1123 11/12/21 1129 12/12/21 1039 01/07/22 1133       Psychosocial Re-Evaluation   Current issues with Current Sleep Concerns Current Sleep Concerns Current Stress Concerns;History of Depression;Current Anxiety/Panic Current Stress Concerns;History of Depression;Current Anxiety/Panic    Comments Estelle's sleep is all over the place. He sometimes gets 3 hours, sometimes 8. His doctor put him on prednisone since May- states his doctor told him he needs to take it and doesn't really have any other options for sleep alternatives. He takes  Xanax before bed which he feels helps him somewhat but still frustrated he doesn't get consistent sleep. Really encouraged him to follow up with his doctor again who he sees on the 15th to let them know how much his sleep is affected by the prednisone. His wife is good support and goes to him with his appointments. He hopes to see a better improvement with his symptoms the more he is in rehab. He is very motivated to do whatever it takes to manage his ILD. Jasier denies any major stressors at this time. He expresses some frustration with his oxygen and his current treatments. He states that his sleep has improved to 5-6 hours a night since coming off prednisone. He is still taking xanax before bed which he states has helped with his sleep. He feels that his wife has been a great support for him and is very encouraging. Reviewed patient health questionnaire (PHQ-9) with patient for follow up. Previously, patients score indicated signs/symptoms of depression.  Reviewed to see if patient is improving symptom wise while in program.  Score stayed the same. Kimarion's final PHQ was back up due to frustrations.  He has really  enjoyed rehab. He has enjoyed learning his limits and know what he can and can't do.  He has learned as much as he can and is ready to take things each day he gets.  He set up his final plans and just wants to live each day he can.  He continues to enjoy watching his grandkids play sports.    Expected Outcomes Short: Talk to doctor about prednisone and sleep Long: Continue to maintain posititve attitude Short: continue to rely on wife for support Long: Continue to maintain posititve outlook Short: Continue to attend [facility] regularly for regular exercise and social engagement. Long: Continue to improve symptoms and manage a positive mental state. Continue to focus on the postive    Interventions Encouraged to attend Pulmonary Rehabilitation for the exercise Encouraged to attend Pulmonary  Rehabilitation for the exercise Encouraged to attend Pulmonary Rehabilitation for the exercise Encouraged to attend Pulmonary Rehabilitation for the exercise    Continue Psychosocial Services  Follow up required by staff Follow up required by staff Follow up required by staff Follow up required by staff             Psychosocial Discharge (Final Psychosocial Re-Evaluation):  Psychosocial Re-Evaluation - 01/07/22 1133       Psychosocial Re-Evaluation   Current issues with Current Stress Concerns;History of Depression;Current Anxiety/Panic    Comments Daiton's final PHQ was back up due to frustrations.  He has really enjoyed rehab. He has enjoyed learning his limits and know what he can and can't do.  He has learned as much as he can and is ready to take things each day he gets.  He set up his final plans and just wants to live each day he can.  He continues to enjoy watching his grandkids play sports.    Expected Outcomes Continue to focus on the postive    Interventions Encouraged to attend Pulmonary Rehabilitation for the exercise    Continue Psychosocial Services  Follow up required by staff             Education: Education Goals: Education classes will be provided on a weekly basis, covering required topics. Participant will state understanding/return demonstration of topics presented.  Learning Barriers/Preferences:   General Pulmonary Education Topics:  Infection Prevention: - Provides verbal and written material to individual with discussion of infection control including proper hand washing and proper equipment cleaning during exercise session. Flowsheet Row Pulmonary Rehab from 12/17/2021 in [facility] Cardiac and Pulmonary Rehab  Date 09/29/21  Educator [facility]  Instruction Review Code 1- Verbalizes Understanding       Falls Prevention: - Provides verbal and written material to individual with discussion of falls prevention and safety. Flowsheet Row Pulmonary Rehab from  12/17/2021 in [facility] Cardiac and Pulmonary Rehab  Date 09/12/21  Educator SB  Instruction Review Code 1- Verbalizes Understanding       Chronic Lung Disease Review: - Group verbal instruction with posters, models, PowerPoint presentations and videos,  to review new updates, new respiratory medications, new advancements in procedures and treatments. Providing information on websites and "800" numbers for continued self-education. Includes information about supplement oxygen, available portable oxygen systems, continuous and intermittent flow rates, oxygen safety, concentrators, and Medicare reimbursement for oxygen. Explanation of Pulmonary Drugs, including class, frequency, complications, importance of spacers, rinsing mouth after steroid MDI's, and proper cleaning methods for nebulizers. Review of basic lung anatomy and physiology related to function, structure, and complications of lung disease. Review of risk factors. Discussion about methods for  diagnosing sleep apnea and types of masks and machines for OSA. Includes a review of the use of types of environmental controls: home humidity, furnaces, filters, dust mite/pet prevention, HEPA vacuums. Discussion about weather changes, air quality and the benefits of nasal washing. Instruction on Warning signs, infection symptoms, calling MD promptly, preventive modes, and value of vaccinations. Review of effective airway clearance, coughing and/or vibration techniques. Emphasizing that all should Create an Action Plan. Written material given at graduation. Flowsheet Row Pulmonary Rehab from 12/17/2021 in [facility] Cardiac and Pulmonary Rehab  Education need identified 09/29/21  Date 11/06/21  Educator [facility]  Instruction Review Code 1- Verbalizes Understanding       AED/CPR: - Group verbal and written instruction with the use of models to demonstrate the basic use of the AED with the basic ABC's of resuscitation.    Anatomy and Cardiac Procedures: - Group  verbal and visual presentation and models provide information about basic cardiac anatomy and function. Reviews the testing methods done to diagnose heart disease and the outcomes of the test results. Describes the treatment choices: Medical Management, Angioplasty, or Coronary Bypass Surgery for treating various heart conditions including Myocardial Infarction, Angina, Valve Disease, and Cardiac Arrhythmias.  Written material given at graduation. Flowsheet Row Cardiac Rehab from 04/01/2016 in [facility] Cardiac and Pulmonary Rehab  Date 02/24/16  Educator TS  Instruction Review Code (retired) 2- meets goals/outcomes       Medication Safety: - Group verbal and visual instruction to review commonly prescribed medications for heart and lung disease. Reviews the medication, class of the drug, and side effects. Includes the steps to properly store meds and maintain the prescription regimen.  Written material given at graduation. Flowsheet Row Cardiac Rehab from 04/01/2016 in [facility] Cardiac and Pulmonary Rehab  Date 03/11/16  Marisue Humble 2]  Educator TS  Instruction Review Code (retired) 2- meets goals/outcomes       Other: -Provides group and verbal instruction on various topics (see comments)   Knowledge Questionnaire Score:  Knowledge Questionnaire Score - 01/05/22 1332       Knowledge Questionnaire Score   Post Score 17/18              Core Components/Risk Factors/Patient Goals at Admission:  Personal Goals and Risk Factors at Admission - 09/29/21 1349       Core Components/Risk Factors/Patient Goals on Admission    Weight Management Yes;Weight Loss;Obesity    Intervention Weight Management: Develop a combined nutrition and exercise program designed to reach desired caloric intake, while maintaining appropriate intake of nutrient and fiber, sodium and fats, and appropriate energy expenditure required for the weight goal.;Weight Management: Provide education and appropriate resources to help  participant work on and attain dietary goals.;Weight Management/Obesity: Establish reasonable short term and long term weight goals.;Obesity: Provide education and appropriate resources to help participant work on and attain dietary goals.    Admit Weight 206 lb (93.4 kg)    Goal Weight: Short Term 200 lb (90.7 kg)    Goal Weight: Long Term 195 lb (88.5 kg)    Expected Outcomes Short Term: Continue to assess and modify interventions until short term weight is achieved;Weight Loss: Understanding of general recommendations for a balanced deficit meal plan, which promotes 1-2 lb weight loss per week and includes a negative energy balance of [phone] kcal/d;Understanding recommendations for meals to include 15-35% energy as protein, 25-35% energy from fat, 35-60% energy from carbohydrates, less than 234m of dietary cholesterol, 20-35 gm of total fiber daily;Understanding of distribution  of calorie intake throughout the day with the consumption of 4-5 meals/snacks;Long Term: Adherence to nutrition and physical activity/exercise program aimed toward attainment of established weight goal    Improve shortness of breath with ADL's Yes    Intervention Provide education, individualized exercise plan and daily activity instruction to help decrease symptoms of SOB with activities of daily living.    Expected Outcomes Short Term: Improve cardiorespiratory fitness to achieve a reduction of symptoms when performing ADLs;Long Term: Be able to perform more ADLs without symptoms or delay the onset of symptoms    Increase knowledge of respiratory medications and ability to use respiratory devices properly  Yes    Intervention Provide education and demonstration as needed of appropriate use of medications, inhalers, and oxygen therapy.    Expected Outcomes Short Term: Achieves understanding of medications use. Understands that oxygen is a medication prescribed by physician. Demonstrates appropriate use of inhaler and oxygen  therapy.;Long Term: Maintain appropriate use of medications, inhalers, and oxygen therapy.    Diabetes Yes    Intervention Provide education about signs/symptoms and action to take for hypo/hyperglycemia.;Provide education about proper nutrition, including hydration, and aerobic/resistive exercise prescription along with prescribed medications to achieve blood glucose in normal ranges: Fasting glucose 65-99 mg/dL    Expected Outcomes Short Term: Participant verbalizes understanding of the signs/symptoms and immediate care of hyper/hypoglycemia, proper foot care and importance of medication, aerobic/resistive exercise and nutrition plan for blood glucose control.;Long Term: Attainment of HbA1C < 7%.    Hypertension Yes    Intervention Provide education on lifestyle modifcations including regular physical activity/exercise, weight management, moderate sodium restriction and increased consumption of fresh fruit, vegetables, and low fat dairy, alcohol moderation, and smoking cessation.;Monitor prescription use compliance.    Expected Outcomes Short Term: Continued assessment and intervention until BP is < 140/54m HG in hypertensive participants. < 130/835mHG in hypertensive participants with diabetes, heart failure or chronic kidney disease.;Long Term: Maintenance of blood pressure at goal levels.    Lipids Yes    Intervention Provide education and support for participant on nutrition & aerobic/resistive exercise along with prescribed medications to achieve LDL <7037mHDL >38m25m  Expected Outcomes Short Term: Participant states understanding of desired cholesterol values and is compliant with medications prescribed. Participant is following exercise prescription and nutrition guidelines.;Long Term: Cholesterol controlled with medications as prescribed, with individualized exercise RX and with personalized nutrition plan. Value goals: LDL < 70mg78mL > 40 mg.             Education:Diabetes -  Individual verbal and written instruction to review signs/symptoms of diabetes, desired ranges of glucose level fasting, after meals and with exercise. Acknowledge that pre and post exercise glucose checks will be done for 3 sessions at entry of program. Flowsheet [facility]  Date 09/29/21  [facility]- Verbalizes Understanding       Know Your Numbers and Heart Failure: - Group verbal and visual instruction to discuss disease risk factors for cardiac and pulmonary disease and treatment options.  Reviews associated critical values for Overweight/Obesity, Hypertension, Cholesterol, and Diabetes.  Discusses basics of heart failure: signs/symptoms and treatments.  Introduces Heart Failure Zone chart for action plan for heart failure.  Written material given at graduation. Flowsheet Row [facility]  Date 10/29/21  Educator SB  Instruction Review Code 1- Verbalizes Understanding  Core Components/Risk Factors/Patient Goals Review:   Goals and Risk Factor Review     Row Name 10/27/21 1129 11/12/21 1134 12/12/21 1030 01/07/22 1128       Core Components/Risk Factors/Patient Goals Review   Personal Goals Review Diabetes;Improve shortness of breath with ADL's;Weight Management/Obesity;Hypertension Diabetes;Improve shortness of breath with ADL's;Weight Management/Obesity;Hypertension Improve shortness of breath with ADL's Improve shortness of breath with ADL's;Weight Management/Obesity;Increase knowledge of respiratory medications and ability to use respiratory devices properly.;Hypertension;Diabetes    Review Wolfgang is checking his blood sugar, its ranging around 120 or less in the morning. His BP at home ranges 993 [id number]./90 diastolic. His BP is running higher than normal because he has been on prednisone for a while. Because of that, his weight has  increased to 204 lb. He used to be in the 190s. He is going to check with his doctor to see if there is something else he can take. He checks weight  about every other day. He does  not feel so much difference in SOB since rehab but only started a little while ago. He is still getting used to his oxygen during exercise. Cristofer states that his blood sugars are under control and his doctor said that he does not need medication for diabetes at this time. Riggs is checking his BP at home every day and states that he is staying around 130/70. Chong states that he has gained some weight while on prednisone and would like to lose down to 190 lbs. He is still experiencing SOB and is getting used to his oxygen during exercise. Spoke to patient about their shortness of breath and what they can do to improve. Patient has been informed of breathing techniques when starting the program. Patient is informed to tell staff if they have had any med changes and that certain meds they are taking or not taking can be causing shortness of breath. Kellis is nearing graduation.  His doing better with his breathing by using his PLB and oxygen for exercise.  He is also working on finding the best medication regimine for him. His weight is down overall.   He was concerned about his weight being different at MD office.  He's good on ours.  His pressures are doing well overall and doctor was pleased.  His sugars continue to do well too. He checks and charts at home.    Expected Outcomes Short: Check with doctor on prednisone Long: Continue to manage lifestyel risk factors Short: Continue to check blood sugar and BP levels at home. Long: Continue to manage risk factors. Short: Attend [facility] regularly to improve shortness of breath with ADL's. Long: maintain independence with ADL's Continue to montior risk factors.             Core Components/Risk Factors/Patient Goals at Discharge (Final Review):   Goals and Risk Factor Review -  01/07/22 1128       Core Components/Risk Factors/Patient Goals Review   Personal Goals Review Improve shortness of breath with ADL's;Weight Management/Obesity;Increase knowledge of respiratory medications and ability to use respiratory devices properly.;Hypertension;Diabetes    Review Tripton is nearing graduation.  His doing better with his breathing by using his PLB and oxygen for exercise.  He is also working on finding the best medication regimine for him. His weight is down overall.   He was concerned about his weight being different at MD office.  He's good on ours.  His pressures are doing well overall and doctor was pleased.  His sugars  continue to do well too. He checks and charts at home.    Expected Outcomes Continue to montior risk factors.             ITP Comments:  ITP Comments     Row Name 09/12/21 1441 09/29/21 1158 10/01/21 1029 10/13/21 1106 10/28/21 1552   ITP Comments Virtual orientation call completed today. he has an appointment on Date: 09/29/2021  for EP eval and gym Orientation.  Documentation of diagnosis can be found in [facility] Date: 09/05/2021 . Completed 6MWT and gym orientation. Initial ITP created and sent for review to Dr. Zetta Bills, Medical Director. 30 Day review completed. Medical Director ITP review done, changes made as directed, and signed approval by Medical Director.   NEW First full day of exercise!  Patient was oriented to gym and equipment including functions, settings, policies, and procedures.  Patient's individual exercise prescription and treatment plan were reviewed.  All starting workloads were established based on the results of the 6 minute walk test done at initial orientation visit.  The plan for exercise progression was also introduced and progression will be customized based on patient's performance and goals. Sent note to Dr. Chase Caller regarding patient to get home oxygen therapy. Patient currently has rehab oxygen prescription, but still  waiting for him to get some for home. Encouraged him to check O2 saturations at home with pulse ox. Patient to see Dr. Chase Caller on 8/15 but hope to hear back on prescription ASAP.    Row Name 10/29/21 0954 11/26/21 1343 12/19/21 1141 12/24/21 0748 01/09/22 1137   ITP Comments 30 Day review completed. Medical Director ITP review done, changes made as directed, and signed approval by Medical Director. 30 Day review completed. Medical Director ITP review done, changes made as directed, and signed approval by Medical Director. Gave transplant info packet 30 Day review completed. Medical Director ITP review done, changes made as directed, and signed approval by Medical Director. Anfernee graduated today from  rehab with 36 sessions completed.  Details of the patient's exercise prescription and what He needs to do in order to continue the prescription and progress were discussed with patient.  Patient was given a copy of prescription and goals.  Patient verbalized understanding.  Lamont plans to continue to exercise by walking at home  Acel has been reminded to keep oxygen at level to maintain pulse oximetry at 88% or higher. .            Comments: Discharge ITP

## 2022-01-13 ENCOUNTER — Telehealth: Payer: Self-pay | Admitting: Internal Medicine

## 2022-01-13 ENCOUNTER — Ambulatory Visit (INDEPENDENT_AMBULATORY_CARE_PROVIDER_SITE_OTHER): Payer: No Typology Code available for payment source | Admitting: Internal Medicine

## 2022-01-13 ENCOUNTER — Encounter: Payer: Self-pay | Admitting: Internal Medicine

## 2022-01-13 VITALS — BP 114/68 | HR 72 | Temp 97.6°F | Ht 67.0 in | Wt 196.8 lb

## 2022-01-13 DIAGNOSIS — K521 Toxic gastroenteritis and colitis: Secondary | ICD-10-CM

## 2022-01-13 DIAGNOSIS — Z7952 Long term (current) use of systemic steroids: Secondary | ICD-10-CM | POA: Diagnosis not present

## 2022-01-13 DIAGNOSIS — J679 Hypersensitivity pneumonitis due to unspecified organic dust: Secondary | ICD-10-CM

## 2022-01-13 DIAGNOSIS — Z7185 Encounter for immunization safety counseling: Secondary | ICD-10-CM

## 2022-01-13 DIAGNOSIS — J9611 Chronic respiratory failure with hypoxia: Secondary | ICD-10-CM | POA: Diagnosis not present

## 2022-01-13 DIAGNOSIS — Z23 Encounter for immunization: Secondary | ICD-10-CM | POA: Diagnosis not present

## 2022-01-13 DIAGNOSIS — J849 Interstitial pulmonary disease, unspecified: Secondary | ICD-10-CM

## 2022-01-13 DIAGNOSIS — E669 Obesity, unspecified: Secondary | ICD-10-CM

## 2022-01-13 DIAGNOSIS — Z6830 Body mass index (BMI) 30.0-30.9, adult: Secondary | ICD-10-CM

## 2022-01-13 NOTE — Patient Instructions (Addendum)
ICD-10-CM   1. Chronic respiratory failure with hypoxia (HCC)  J96.11     2. ILD (interstitial lung disease) ([place])  J84.9     3. 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 Current chronic use of systemic steroids  Z79.52     5. Diarrhea due to drug  K52.1     6. Class 1 obesity with serious comorbidity and body mass index (BMI) of 30.0 to 30.9 in adult, unspecified obesity type  E66.9    Z68.30     7. Vaccine counseling  Z71.85     8. 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  -Severe disease although stable without flare of compared to last time -Having significant desaturations with exercise per your history -could be due to falls pulse oximeter -Glad he completed rehabilitation [pulmonary] but too bad you do not feel the benefit from it -Having significant diarrhea from nintedanib -Tolerating 10 mg of prednisone daily -Liver function test normal 12/27/2021 -Overnight oxygen study done at the [facility] and resolved with primary care doctor   Plan -Continue prednisone 10 mg/day - Stop nintedanib for 1 week and then restart at 150 mg once daily for 1 week and then go up to 150 mg twice daily  -Once you start the nintedanib take Imodium on a daily scheduled basis once or twice daily  -Stop all caffeine and fruit juices especially those that set "no added sugar" -At next visit we will talk about reintroducing Imuran or starting CellCept to control your fibrosis -Referral lung transplantation at [place] maintenance pulmonary rehabilitation even if you feel it is not helping you it actually is and will be needed for future lung transplantation -You need right heart catheterization; will send a note to your cardiologist  -If there is pulmonary hypertension then you will qualify for an inhaler called treprostinil -Next liver function test in 1 month [can be through your primary care doctor] -Follow-up on overnight  oxygen study result with your primary care doctor; start night oxygen if needed -Continue 4 L -6L N nasal cannula with exertion; goal pulse ox greater than 86%  -Buy Nellcor or Masimo pulse oximeter which is more accurate  -Take consent form for ILD-Pro registry study   #Obesity with a BMI greater than 30 and hyperglycemia  - Glad there is some weight loss possibly due to ofev -You definitely need to lose weight to a BMI less than 27; approximately 170-175 pounds to qualify for lung transplant  Plan  - Talk to primary care doctor about new weight loss drugs [caution GI side effects] -Talk to primary care doctor about low carbohydrate diet -Avoid all sugars including fruit juices  #vaccine counseling   Plan  - High-dose flu shot today -RSV vaccine and COVID mRNA booster on your own commercially  Pre-operative respiratory examination  -Future consideration of umbilical hernia surgery  - being held off by surgeon  Plan - monitor  Follow-up - Return to see Dr. Chase Caller or APP  30-minute visit in less than 4-6 weeks  - video visiti fine to see rechallenge with nintedanib

## 2022-01-13 NOTE — Telephone Encounter (Signed)
Hi Dr Rockey Situ  Re [place] Cerise though echo is ok given his 4-6L O2 need with ILD and poential for transplant and possible OSA -> he meets indication for RHC with intent to Rx with tyvaso.   If you can facilitate will be great  Thanks    SIGNATURE    Dr. Brand Males, M.D., F.C.C.P,  Pulmonary and Critical Care Medicine Staff Physician, [place] Director - Interstitial Lung Disease  Program  Medical Director - [facility] [place], [place], [address]   Pager: [phone], If no answer  -[place] or Try [phone] Telephone (clinical office): [phone] Telephone (research): [phone]  11:03 AM 01/13/2022

## 2022-01-13 NOTE — Progress Notes (Signed)
OV 11/04/2021  Subjective:  Patient ID: Brian Casey, male , DOB: [birth year] , age 71 y.o. , MRN: [id number] , ADDRESS: [address] [place] [address] PCP [name], MD Patient Care Team: [name], MD as PCP - General (Family Medicine) Minna Merritts, MD as Consulting Physician (Cardiology) Dahlia Byes, MD as Consulting Physician (Cardiothoracic Surgery)  This Provider for this visit: Treatment Team:  Attending Provider: Brand Males, MD    11/04/2021 -  hx chart reviwee,patient, wife, ILD questions Chief Complaint  Patient presents with   Consult    Pt has had recent imaging performed which showed diagnosis of ILD. Pt has had complaints of cough and SOB.     HPI Brian Casey 71 y.o. -  Presents with wife. Has hx of heavy asbestos ezposure. Former smoker. In Aug 2017 had acute chole, e colii bcacteremia and NSTEMI and sp CABG. CTA at that time showed some GGO and bialteral efffusion. Then in 2019 June admitted for had pleuirsy and s/p thora 1L (rad review confirms that). LHD 579. CUtlure negatiuve. WAS EOSINOPHLIC 27%. C at that the time considered as ILD by chest radiology. Also had AKI creat 1.35m%. Then started following with Dr [place]aul Delin [place] Says he was on surveillance approach. No thora ever after that. In April 2022 he had CT - the ILD for first time started getting worse but he was feeling fine. In June 2022 started seeing Dr ALanney Gins ARound this time also had Covid. In Nov 2022 had another CT cchest  ILD progressed but stil feeling ok. In Jan 2023 developed cough, ACE inhibitor got changed to ARB and sometime later got started in pred/immuran as well for his ILD. Then developed flu like symptoms. Both ARB and immuran g t stopped and he got better. Immuran restarted at lower dose at 547mper day in May 2023. CT at this time showed worsening ILD. 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 Prednisone at 2029mer day. No more flu symptoms.  Feels side effect was ARB related. Despte immune suppression regimen he feels he is declining more dysneic. Still on room air at home and started rehab few days ago. Noticed in rehab needing 4L [place] to exert  (also confirmed here this visit). In addition prednisone is causing insomina and fatigue. He feels this med regimen is not helping  Of note he has upcoing endoscopy and colonoscop with Dr TolAlice Reichert30/23. Also has a verntral hernia for which he wants surgery. Overall he and his wife are very concerned about this ILD   He is also on Inhaler Rx and not sure it is helOrthoptisttegrated Comprehensive ILD Questionnaire  Symptoms:  gradally getting worse x 6 months. Cough x 6 months only but CT actually getting worse Apr 2022 -> May 2023. 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 Worked for [employer] in [place] (27 items):  Prednisone and Immuran since May 2023  INVESTIGATIONS: 2017 CT abdomen lung cuts  He had left-sided pleural effusion and reticular abnormalities suggestive of ILD [back then it was  called as bibasilar atelectasis]  2017 August CT scan of the chest: Bilateral pleural effusions some upper lobe groundglass opacities.  No clear-cut evidence of ILD:  2019 June CT scan of the chest: Read by Dr. Polly Cobia.  Concerning for ILD.  Reticulation also left-sided pleural effusion  May 2021 high-resolution CT chest: Read by Dr. Polly Cobia.  Indeterminate pattern for UIP versus alternate diagnosis.Marland Kitchen  Definite of ILD.  There is subpleural sparing.  Left-sided pleural effusion present.  Findings deemed is unchanged from prior.  I personally agree with the findings  April 2022 CT chest without contrast [not a high-resolution CT chest].  Personally thought ILD is worse.  Radiology seems to agree  November 2022 in May 2023 CT scan of the chest [again not high-resolution] definite worsening of ILD changes.  Increased groundglass component.  May 2023: CT - progressive ILD. Worsning GGO and reticulation. Trace left effuion   UGI score 11/19/21:: Diffuse mild inflammation characterized by congestion (edema) and erosions was found in the entire examined stomach.   OV 11/20/2021  Subjective:  Patient ID: Brian Casey, male , DOB: 1950/09/26 , age 71 y.o. , MRN: [id number] , ADDRESS: [place] [address] PCP [name], MD Patient Care Team: [name], MD as PCP - General (Family Medicine) Minna Merritts, MD as Consulting Physician (Cardiology) Dahlia Byes, MD as Consulting Physician (Cardiothoracic Surgery)  This Provider for this visit: Treatment Team:  Attending Provider: Brand Males, MD   11/20/2021 -returns for video visit.  ILD work-up in progress.   HPI Brian Casey 71 y.o. -  #ILD work-up: After his last visit I presented him at the thoracic multidisciplinary conference.  Dr. Burt Ek felt the CT scan was more consistent with alternative pattern possibly fibrotic NSIP or even HP given the upper lobe predominance and air trapping.  He  subsequently had a high-resolution CT scan of the chest that was read by Dr. Vinnie Langton who feels CT scan is more consistent with chronic HP.  We went over the exposures again there is no current exposure.  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  While he remembers asbestos exposure he does not remember mold exposure but it was definitely water and there was a lot of dampness.  There is no evidence of asbestos plaques or UIP pattern on the CT scan of the chest.  Nevertheless it is progressive.  We discussed the differential diagnosis currently of chronic HP versus NSIP versus asbestosis versus IPF.  It appears that most likely this is chronic HP.  Based on this and the progression my recommendation to him was that we would start antifibrotic's.  Did indicate to him that there is a role for prednisone and immunomodulators in the setting of chronic NSIP or HP but given the side effect profile with prednisone [fatigue and insomnia] and also future potential risk for  immunosuppression with Imuran I advised that antifibrotic's nintedanib would be first-line.  He is better now with 10 mg prednisone.  He is sleeping better.  Therefore also recommended continuing with prednisone.  Recommended holding off any restarting of the Imuran or consideration of CellCept as a third line option.  We will also going to hold off on surgical lung biopsy.  Indicated to him that in the long run he would need to lose weight get fit and consider transplant as an option.  For these purposes I would like to avoid immunosuppression and also surgical lung biopsy.  In addition with 4 L exertion desaturation did not want to consider surgical lung biopsy at this point in time.  He and his wife are agreeable with the plan  #Therapeutic side effects: After stopping prednisone his insomnia and fatigue are better.  We discussed  nintedanib.  He does have diverticulosis on the recent CT scan but has never had diverticulitis.  He does not have a history of GI bleeding.  No history of MI.  Recent echo was normal.  We discussed the diarrhea and liver toxicity monitoring of the rare side effects of nintedanib.  He is agreeable to start this.  #Hypoxemia: He told me that he is desaturating at home with 4 L but does well in rehab because of his machine.  I recommended oxygen titration test but my CMA indicated to me that he did fine with 4 L exertion in our office at last visit.  She will him to contact him and verify if he is still desaturating at home.  If so then we will have to do a detailed oxygen titration test.  He is still awaiting his Joselyn Arrow through the [place].  He does not want to do it through [facility] and it could take a while.  #Umbilical hernia repair.  This is being held off at this point.       HRCT 11/13/21  Narrative & Impression  CLINICAL DATA:  71 year old male with history of cough and shortness of breath worsening since May 2023. Evaluate for interstitial lung disease.   EXAM: CT CHEST WITHOUT CONTRAST   TECHNIQUE: Multidetector CT imaging of the chest was performed following the standard protocol without intravenous contrast. High resolution imaging of the lungs, as well as inspiratory and expiratory imaging, was performed.   RADIATION DOSE REDUCTION: This exam was performed according to the departmental dose-optimization program which includes automated exposure control, adjustment of the mA and/or kV according to patient size and/or use of iterative reconstruction technique.   COMPARISON:  Chest CT 08/20/2021.   FINDINGS: Cardiovascular: Heart size is normal. There is no significant pericardial fluid, thickening or pericardial calcification. There is aortic atherosclerosis, as well as atherosclerosis of the great vessels of the mediastinum and the coronary arteries,  including calcified atherosclerotic plaque in the left main, left anterior descending, left circumflex and right coronary arteries. Status post median sternotomy for CABG including LIMA to the LAD.   Mediastinum/Nodes: No pathologically enlarged mediastinal or hilar lymph nodes. Please note that accurate exclusion of hilar adenopathy is limited on noncontrast CT scans. Esophagus is unremarkable in appearance. No axillary lymphadenopathy.   Lungs/Pleura: Widespread but patchy areas of ground-glass attenuation, septal thickening, subpleural reticulation, thickening of the peribronchovascular interstitium, cylindrical bronchiectasis and peripheral bronchiolectasis are noted. Some areas of apparent developing honeycombing are noted, most evident near the apex of the right upper lobe. Overall, findings appear relatively similar to the recent prior chest CT. Inspiratory and  expiratory imaging demonstrates some mild air trapping indicative of mild small airways disease. No definite suspicious appearing pulmonary nodules or masses are noted. No pleural effusions.   Upper Abdomen: Aortic atherosclerosis.  Status post cholecystectomy.   Musculoskeletal: Median sternotomy wires. There are no aggressive appearing lytic or blastic lesions noted in the visualized portions of the skeleton.   IMPRESSION: 1. The appearance of the lungs is indicative of interstitial lung disease, with minimal progression compared to the most recent prior study, but definitive progression when compared to more remote prior examinations. 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 2. Aortic atherosclerosis, in addition to left main and three-vessel coronary artery disease. Status post median sternotomy for CABG including LIMA to the LAD.   Aortic Atherosclerosis  (ICD10-I70.0).     Electronically Signed   By: Vinnie Langton M.D.   On: 11/15/2021 06:28    ECHO 11/12/21  IMPRESSIONS     1. Left ventricular ejection fraction, by estimation, is 60 to 65%. The  left ventricle has normal function. The left ventricle has no regional  wall motion abnormalities. There is mild left ventricular hypertrophy.  Left ventricular diastolic parameters  are consistent with Grade I diastolic dysfunction (impaired relaxation).   2. Right ventricular systolic function is normal. The right ventricular  size is normal.   3. The mitral valve is normal in structure. No evidence of mitral valve  regurgitation.   4. The aortic valve is tricuspid. Aortic valve regurgitation is not  visualized. Aortic valve sclerosis is present, with no evidence of aortic  valve stenosis.   5. Aortic dilatation noted. There is mild dilatation of the aortic root,  measuring 39 mm.   6. The inferior vena cava is normal in size with greater than 50%  respiratory variability, suggesting right atrial pressure of 3 mmHg.    Aug 2023 ILD confi with Dr Burt Ek  In 2019 early ILD with reticuation. SIgnifcant progressiont hrough latest CT in May 2023. Lot of upper lobe > Lower lobe back then and now. OVerall pattern: ALTERNATIVE CATEGRORy. DDx is FIBROTIC HP (2021 Exp phase imaging was inadequate , 2020 there was mild air trappng). In May 2023 - more GGO  ? flare. Overall patch peribronchosvascular. NOT c/w UIP. Has pleural thicekning on left but prob due to cardiac urgery. No calcifiied pleural plaques  Consider bix if Risk ok   OV 01/13/2022  Subjective:  Patient ID: Brian Casey, male , DOB: 11-11-1950 , age 73 y.o. , MRN: [id number] , ADDRESS: [address] [place] [address] PCP [name], MD Patient Care Team: [name], MD as PCP - General (Family Medicine) Minna Merritts, MD as Consulting Physician (Cardiology) Dahlia Byes, MD as Consulting  Physician (Cardiothoracic Surgery)  This Provider for this visit: Treatment Team:  Attending Provider: Brand Males, MD    01/13/2022 -   Chief Complaint  Patient presents with   Follow-up    Follow-up PT states breathing is not better, DOE   ILD - workup complete. 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 -  prsents with wife for followup. He and wife are quire worried about his health and life expectancy (few to several years) and quality of life He has sveral concrns  Easy destruation- sometimes to 60% and 75%. Always with eerrtion and relieved by rest but there is variability. He is unsure of quality of pulse ox probe but appears same thing inr ehab based on 40md test chart review. STill here able to walk ADL distances ith 6L and without desats Echo shows Gr 1 DDx - has seen cards Saw Dr GRockey Situ- has not had [place] did not help him though 679m distance improved fom 400+ feet to 700 + feet ILD Symptoms: feels getting worse. Needing more o2 Not yet started night o2: ONO results are finally in but with PCP [facility]Maryland [facility]MD [facility]  On ofev but having severe diarrhea. Takes immodium in reactionary fashion every other day. Drinks coffee daily.  Drinks "no sugar added" apple juice Rx - continue prednisone 1042mer day Pointd out he worked in [employer] - explained could be few to several years but with small % declining rapidly in < 1 year.  In his case PFTs show sever restriction and dLCO Also unintenitional weight loss due to ofev - but he sees this as positive due to obsity Has new low K 3 meq 12/31/21 and was wondering if due to prednisone  (unlikely) or diarrhea (possibly yes) Abd hernia - surgery not in the works Obesity - BMI is > 30 and is a barrier to health  =    SYMPTOM SCALE - ILD 11/04/2021 01/13/2022 ofev  Current weight  196#   [id number] feet in July 745 feet afte rehab In Oct 2023  O2 use RA but needing 4L [place] with exertin 4L rest and exertion. Sometimes 6L  Shortness of Breath 0 -> 5 scale with 5 being worst (score 6 If unable to do)   At rest 0 0  Simple tasks - showers, clothes change, eating, shaving 4 4  Household (dishes, doing bed, laundry) 4 4  Shopping 3 4  Walking level at own pace 3 4  Walking up Stairs 5 5  Total (30-36) Dyspnea Score 19 21    Non-dyspnea symptoms (0-> 5 scale) 11/04/2021 01/13/2022   How bad is your cough? 2.5 0  How bad is your fatigue 3 3  How bad is nausea 0 2  How bad is vomiting?  0 0  How bad is diarrhea? 0 5+  How bad is anxiety? 3 4  How bad is depression 2 3  Any chronic pain - if so where and how bad 0 x    Simple office walk 185 feet x  3 laps goal with forehead probe 01/13/2022    O2 used 4   Number laps completed Aim ws 3 and start of 2nd lap increased o2   Comments about pace avg   Resting Pulse Ox/HR 100% and 66/min   Final Pulse Ox/HR 86% and 98/min   Desaturated </= 88% Yes at start of 2n lap   Desaturated <= 3% points yes   Got Tachycardic >/= 90/min yes   Symptoms at end of test Mod doe   Miscellaneous comments 6L [place] finished all 3 laps wth correction        PFT     Latest Ref Rng & Units 12/12/2021   10:02 AM 11/11/2015    7:38 AM  PFT Results  FVC-Pre L 1.41  1.20   FVC-Predicted Pre % 35  28   FVC-Post L 1.46  1.35   FVC-Predicted Post % 36  32   Pre FEV1/FVC % % 89  60   Post FEV1/FCV % % 89  56   FEV1-Pre L 1.26  0.72   FEV1-Predicted Pre % 43  22   FEV1-Post L 1.30  0.76   DLCO uncorrected ml/min/mmHg 5.38  8.93   DLCO UNC% % 22  31   DLCO corrected ml/min/mmHg 5.38  10.84   DLCO COR %Predicted % 22  38   DLVA Predicted  % 59  119   TLC L 2.23  2.47   TLC % Predicted % 34  38   RV % Predicted % 33  61        has a past medical history of Acute renal failure ([place]) (11/06/2015), Adenomatous colon polyp, Anxiety, Arthritis, Depression, Diverticulosis, GERD (gastroesophageal reflux disease), High cholesterol, History of bronchitis, Hypertension (10/17/2013), Hyperthyroidism, Hypothyroidism, ILD (interstitial lung disease) ([place]), New onset atrial fibrillation ([place]) (11/06/2015), NSTEMI (non-ST elevated myocardial infarction) ([place]) (11/07/2015), Thyroid disease, Type II diabetes mellitus ([place]), and Wears glasses.   reports that he quit smoking about 6 years ago. His smoking use included cigarettes. He has a 48.00 pack-year smoking history. He has never used smokeless tobacco.  Past Surgical History:  Procedure Laterality Date   CARDIAC CATHETERIZATION Right 11/06/2015   Procedure: Left Heart Cath and Coronary Angiography;  Surgeon: Minna Merritts, MD;  Location: [facility];  Service: Cardiovascular;  Laterality: Right;   CHOLECYSTECTOMY N/A 01/17/2016   Procedure: LAPAROSCOPIC CHOLECYSTECTOMY WITH INTRAOPERATIVE CHOLANGIOGRAM;  Surgeon: Fanny Skates, MD;  Location: [facility];  Service: General;  Laterality: N/A;   COLONOSCOPY     COLONOSCOPY N/A 11/19/2021   Procedure: COLONOSCOPY;  Surgeon: Toledo, Benay Pike, MD;  Location: [facility];  Service: Gastroenterology;  Laterality: N/A;   CORONARY ARTERY BYPASS GRAFT N/A 11/14/2015   Procedure: CORONARY ARTERY BYPASS GRAFTING (CABG) x 4;  Surgeon: Ivin Poot, MD;  Location: [facility];  Service: Open Heart Surgery;  Laterality: N/A;   ENDARTERECTOMY Left 11/14/2015   Procedure: LEFT ENDARTERECTOMY CAROTID;  Surgeon: Waynetta Sandy, MD;  Location: [facility];  Service: Vascular;  Laterality: Left;   ESOPHAGOGASTRODUODENOSCOPY N/A 11/19/2021   Procedure: ESOPHAGOGASTRODUODENOSCOPY (EGD);  Surgeon: Toledo, Benay Pike, MD;  Location: [facility];  Service:  Gastroenterology;  Laterality: N/A;   FINGER GANGLION CYST EXCISION Left X 3   "index finger X 2; thumb X 1"   [place] OF MESH N/A 11/16/2013   Procedure: INSERTION OF MESH;  Surgeon: Imogene Burn. Georgette Dover, MD;  Location: [facility];  Service: General;  Laterality: N/A;   IR GENERIC HISTORICAL  11/08/2015   IR PERC CHOLECYSTOSTOMY 11/08/2015 Corrie Mckusick, DO [facility]   IR GENERIC HISTORICAL  12/13/2015   IR CHOLANGIOGRAM EXISTING TUBE 12/13/2015 [facility]   IR GENERIC HISTORICAL  12/25/2015   IR CATHETER TUBE CHANGE 12/25/2015 Sandi Mariscal, MD [facility]   IR GENERIC HISTORICAL  12/12/2015   IR RADIOLOGIST EVAL & MGMT 12/12/2015 Greggory Keen, MD [facility]   TEE WITHOUT CARDIOVERSION N/A 11/14/2015   Procedure: TRANSESOPHAGEAL ECHOCARDIOGRAM (TEE);  Surgeon: Ivin Poot, MD;  Location: [facility];  Service: Open Heart Surgery;  Laterality: N/A;   UMBILICAL HERNIA REPAIR N/A 11/16/2013   Procedure: UMBILICAL HERNIA REPAIR;  Surgeon: Imogene Burn. Georgette Dover, MD;  Location: [facility];  Service: General;  Laterality: N/A;   VEIN HARVEST Right 11/14/2015   Procedure: RIGHT LEG GREATER SAPHENOUS VEIN HARVEST;  Surgeon: Ivin Poot, MD;  Location: [facility];  Service: Open Heart Surgery;  Laterality: Right;    Allergies  Allergen Reactions   Penicillins Nausea And Vomiting and Other (See Comments)    REACTION: Vomiting Has patient had a PCN reaction causing immediate rash, facial/tongue/throat swelling, SOB or lightheadedness with hypotension: Yes Has patient had a PCN reaction causing severe rash involving mucus membranes or skin necrosis: No Has patient had a PCN reaction that required hospitalization No Has patient had a PCN reaction occurring within the last 10 years: No If all of the above answers are "NO", then may proceed with Cephalosporin use.   Amlodipine Swelling   Bupropion    Bupropion Hcl     REACTION: Severe constipation, insomnia    Chlorthalidone Other (See Comments)    Other reaction(s): Hyponatremia   Metformin Diarrhea   Terazosin Other (See Comments)    Immunization History  Administered Date(s) Administered   Fluad Quad(high Dose 65+) 01/13/2022   Influenza Inj Mdck Quad Pf 12/25/2015   Influenza Split 01/26/2011, 02/10/2012   Influenza Whole 01/22/2004, 02/05/2008, 12/26/2008   Influenza,inj,Quad PF,6+ Mos 12/29/2012, 01/10/2014, 12/11/2014   Influenza-Unspecified 12/25/2016, 12/22/2017, 12/21/2018, 12/07/2019, 12/24/2020   PFIZER Comirnaty(Gray Top)Covid-19 Tri-Sucrose Vaccine 04/09/2019, 05/02/2019, 12/28/2019, 01/01/2021   Pneumococcal Conjugate-13 03/29/2014, 11/06/2014, 11/15/2017   Pneumococcal Polysaccharide-23 11/05/2016   Td 08/22/1998, 08/18/2013   Zoster, Live 03/29/2014    Family History  Problem Relation Age of Onset   Hypertension Mother    Diabetes Mother    Depression Mother    Colon cancer Mother    Hyperthyroidism Father    Hypertension Father    Prostate cancer Father    Diabetes Maternal Grandmother    Lung cancer Maternal Grandmother        non smoker     Current Outpatient Medications:    albuterol (VENTOLIN HFA) 108 (90 Base) MCG/ACT inhaler, , Disp: , Rfl:    aspirin EC 81 MG tablet, Take 81 mg by mouth daily. Swallow whole., Disp: , Rfl:    atorvastatin (LIPITOR) 40 MG tablet, Take 40 mg by mouth daily., Disp: , Rfl:    carvedilol (COREG) 12.5 MG tablet, Take 12.5 mg by mouth 2 (two) times daily with a meal., Disp: , Rfl:    furosemide (LASIX) 20 MG tablet, Take 20 mg by mouth daily., Disp: , Rfl:    levothyroxine (SYNTHROID) 100 MCG tablet, Take 100 mcg by mouth daily before breakfast., Disp: , Rfl:    Loperamide HCl (IMODIUM PO), Take 1 mg by mouth as needed (PT takes as needed)., Disp: , Rfl:    Nintedanib (OFEV) 150 MG CAPS, Take 1 capsule (150 mg total) by mouth 2 (two) times daily., Disp: 60 capsule, Rfl: 2   omeprazole (PRILOSEC) 20 MG capsule, Take 1 capsule  (20 mg total) by mouth daily. OFFICE VISIT REQUIRED FOR ADDITIONAL REFILLS, Disp: 30 capsule, Rfl: 0   potassium chloride (KLOR-CON M) 10 MEQ tablet, Take 2 tabs BID x3 days, then 1 tab daily, Disp: , Rfl:    predniSONE (DELTASONE) 10 MG tablet, Take 1 tablet (10 mg total) by mouth daily with breakfast., Disp: 90 tablet, Rfl: 0   tadalafil (CIALIS) 5 MG tablet, Take 1 tablet by mouth as needed., Disp: , Rfl:    budesonide (PULMICORT) 0.25 MG/2ML nebulizer  solution, INHALE CONTENTS OF ONE VIAL/NEBULE VIA NEBULIZER TWO TIMES A DAY (Patient not taking: Reported on 11/05/2021), Disp: , Rfl:    ipratropium-albuterol (DUONEB) 0.5-2.5 (3) MG/3ML SOLN, Take by nebulization., Disp: , Rfl:       Objective:   Vitals:   01/13/22 0935  BP: 114/68  Pulse: 72  Temp: 97.6 F (36.4 C)  TempSrc: Oral  SpO2: 95%  Weight: 196 lb 12.8 oz (89.3 kg)  Height: 5' 7" (1.702 m)    Estimated body mass index is 30.82 kg/m as calculated from the following:   Height as of this encounter: 5' 7" (1.702 m).   Weight as of this encounter: 196 lb 12.8 oz (89.3 kg).  _0 @  Filed Weights   01/13/22 0935  Weight: 196 lb 12.8 oz (89.3 kg)     Physical Exam   General: No distress. obese Neuro: Alert and Oriented x 3. GCS 15. Speech normal Psych: Pleasant Resp:  Barrel Chest - no.  Wheeze - no, Crackles - YES, No overt respiratory distress CVS: Normal heart sounds. Murmurs - no Ext: Stigmata of Connective Tissue Disease - no HEENT: Normal upper airway. PEERL +. No post nasal drip        Assessment:       ICD-10-CM   1. Chronic respiratory failure with hypoxia (HCC)  J96.11     2. ILD (interstitial lung disease) ([place])  J84.9     3. 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 Current chronic use of systemic steroids  Z79.52     5. Diarrhea due to drug  K52.1     6. Class 1 obesity with serious comorbidity and body mass index (BMI) of 30.0 to 30.9 in adult, unspecified obesity type   E66.9    Z68.30     7. Vaccine counseling  Z71.85     8. Flu vaccine need  Z23     9. Need for immunization against influenza  Z23 Flu Vaccine QUAD High Dose(Fluad)         Plan:     Patient Instructions     ICD-10-CM   1. Chronic respiratory failure with hypoxia (HCC)  J96.11     2. ILD (interstitial lung disease) ([place])  J84.9     3. 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 Current chronic use of systemic steroids  Z79.52     5. Diarrhea due to drug  K52.1     6. Class 1 obesity with serious comorbidity and body mass index (BMI) of 30.0 to 30.9 in adult, unspecified obesity type  E66.9    Z68.30     7. Vaccine counseling  Z71.85     8. 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  -Severe disease although stable without flare of compared to last time -Having significant desaturations with exercise per your history -could be due to falls pulse oximeter -Glad he completed rehabilitation [pulmonary] but too bad you do not feel the benefit from it -Having significant diarrhea from nintedanib -Tolerating 10 mg of prednisone daily -Liver function test normal 12/27/2021 -Overnight oxygen study done at [facility] and resolved with primary care doctor   Plan -Continue prednisone 10 mg/day - Stop nintedanib for 1 week and then restart at 150 mg once daily for 1 week and then go up to 150 mg twice daily  -Once you start the nintedanib take Imodium on a daily scheduled  basis once or twice daily  -Stop all caffeine and fruit juices especially those that set "no added sugar" -At next visit we will talk about reintroducing Imuran or starting CellCept to control your fibrosis -Referral lung transplantation at [facility] maintenance pulmonary rehabilitation even if you feel it is not helping you it actually is and will be needed for future lung transplantation -You need right heart catheterization; will send a  note to your cardiologist  -If there is pulmonary hypertension then you will qualify for an inhaler called treprostinil -Next liver function test in 1 month [can be through your primary care doctor] -Follow-up on overnight oxygen study result with your primary care doctor; start night oxygen if needed -Continue 4 L -6L N nasal cannula with exertion; goal pulse ox greater than 86%  -Buy Nellcor or [place] pulse oximeter which is more accurate  -Take consent form for ILD-Pro registry study   #Obesity with a BMI greater than 30 and hyperglycemia  -You definitely need to lose weight to a BMI less than 27; approximately 170-175 pounds to qualify for lung transplant  Plan  - Talk to primary care doctor about new weight loss drugs [caution GI side effects] -Talk to primary care doctor about low carbohydrate diet -Avoid all sugars including fruit juices  #vaccine counseling   Plan  - High-dose flu shot today -RSV vaccine and COVID mRNA booster on your own commercially  Pre-operative respiratory examination  -Future consideration of umbilical hernia surgery  - being held off by surgeon  Plan - monitor  Follow-up - Return to see Dr. Chase Caller or APP  30-minute visit in less than 4-6 weeks  - video visiti fine to see rechallenge with nintedanib  ( Level 05 visit: Estb 40-54 min in  visit type: telephone visit  in total care time and counseling or/and coordination of care by this undersigned MD - Dr Brand Males. This includes one or more of the following on this same day 01/13/2022: pre-charting, chart review, note writing, documentation discussion of test results, diagnostic or treatment recommendations, prognosis, risks and benefits of management options, instructions, education, compliance or risk-factor reduction. It excludes time spent by the [place] or office staff in the care of the patient. Actual time 27 min)   SIGNATURE    Dr. Brand Males, M.D., F.C.C.P,  Pulmonary and  Critical Care Medicine Staff Physician, [place] Director - Interstitial Lung Disease  Program  Pulmonary [place] [facility] [place], [place], [address]  Pager: [phone], If no answer or between  15:00h - 7:00h: call [phone] Telephone: [phone]  10:48 PM 01/13/2022

## 2022-01-22 ENCOUNTER — Encounter: Payer: Self-pay | Admitting: Internal Medicine

## 2022-01-22 DIAGNOSIS — K521 Toxic gastroenteritis and colitis: Secondary | ICD-10-CM | POA: Insufficient documentation

## 2022-01-22 DIAGNOSIS — D126 Benign neoplasm of colon, unspecified: Secondary | ICD-10-CM | POA: Insufficient documentation

## 2022-01-23 ENCOUNTER — Other Ambulatory Visit: Payer: Self-pay | Admitting: Pharmacist

## 2022-01-23 DIAGNOSIS — J849 Interstitial pulmonary disease, unspecified: Secondary | ICD-10-CM

## 2022-01-23 MED ORDER — OFEV 150 MG PO CAPS: 150.0000 mg | ORAL_CAPSULE | Freq: Two times a day (BID) | ORAL | 1 refills | Status: DC

## 2022-01-23 NOTE — Telephone Encounter (Signed)
Pharmacy, please advise on pt's Ofev refill. Thanks.

## 2022-01-23 NOTE — Telephone Encounter (Signed)
Refill sent for OFEV to  [facility] [facility]  Dose: 150 mg twice daily  Last OV: 01/13/2022 Provider: Dr. Chase Caller  Next OV: 03/12/2022  CMP on 12/31/2021 - lfts stable  Clinicals faxed to [facility] Fax:  [phone]   Knox Saliva, PharmD, MPH, BCPS Clinical Pharmacist (Rheumatology and Pulmonology)

## 2022-01-27 ENCOUNTER — Telehealth: Payer: Self-pay | Admitting: Internal Medicine

## 2022-01-27 ENCOUNTER — Encounter: Payer: Self-pay | Admitting: Internal Medicine

## 2022-01-27 NOTE — Telephone Encounter (Signed)
ONO pulse ox < = 88% -> 6h. So needs 2L [place] a night

## 2022-01-28 NOTE — H&P (View-Only) (Signed)
Cardiology Office Note:    Date:  01/29/2022   ID:  Brian Casey, DOB January 30, 1951, MRN [id number]  PCP:  [name], [place] [facility] Cardiologist:  Ida Rogue, MD      Referring MD: [name], MD   Chief Complaint  Patient presents with   Consult for Cath    Ramaswamy wanted a consult for a cath. Patient has shortness of breath. Meds reviewed with patient.     History of Present Illness:    Brian Casey is a 71 y.o. male with a hx of the following:  AF, converted to NSR, not on Eliquis HTN CAD, s/p NSTEMI, s/p CABG x 4 in 2017 Aorthic atherosclerosis Bilateral Carotid stenosis, s/p left carotid endarterectomy in 2017 Grade 2 DD -> Grade 1 DD CKD stage 3a Hx of remote alcohol abuse Hypothyroidism HLD Former tobacco abuse for 40 years, quit in 2017 ILD (followed by Pulmonary)  In 2017 he had a 3-day history of right upper quadrant pain, CT of abdomen and pelvis showed acute cholecystitis.  He was noted to be in new onset A-fib with RVR, troponin peaked at 0.11, dx with NSTEMI. A-fib was rate controlled and ultimately converted to sinus rhythm on IV amiodarone.  Underwent nuclear medicine stress test that was abnormal and noted to be at high risk as imaging study revealed moderate size region of moderate intensity ischemia in lateral wall, EF estimated at 68%.  Left heart cath performed by Dr. Rockey Situ revealed multivessel disease and underwent four-vessel CABG in 2017 with LIMA to LAD, SVG to diagonal, SVG to RI, SVG to PDA.  Was also found to have severe L ICA stenosis at 80 to 99%, underwent successful left carotid endarterectomy in 2017, right ICA stenosis was found to be 60 to 79%.   He has been followed by the [place], and had a previous history of wearing a cardiac monitor that did not reveal any A-fib, he was taken off Eliquis by the [place].  History of occupational exposure to asbestos, worked as an Chief Operating Officer.  Has been followed by  pulmonology (Dr. Chase Caller) for diagnosis of interstitial lung disease, has had chronic shortness of breath that has become progressively worse recently.  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  He was doing lung works program, was scheduled to start oxygen.  CT of the chest revealed coronary artery calcifications and aortic atherosclerosis.    Last seen by Dr. Rockey Situ on November 05, 2021.  Patient was noted to have progressive shortness of breath related to ILD. Echocardiogram was ordered by Dr. Rockey Situ and revealed normal LV and RV size and function, no significant valvular disease, no indication of pulmonary hypertension or high pressures, aortic dilatation of aortic root at 39 mm. Patient stated he was scheduled to start on oxygen and was weaning off prednisone with possible goal biopsy in the future.  His overnight oximetry readings were pending.   Today he presents for follow-up.  He states his pulmonologist, Dr. Chase Caller, is requesting a heart catheterization to be performed.  He stated his pulse oximetry readings revealed drops in oxygen at night as well as with exertion.  He states at rest he does not require any oxygen however brushing his teeth or doing more physical activity requires between 4 to 6 L of continuous oxygen.  He found his O2 sats dropped to 70% while sleeping at night,  he is scheduled to wear 2 L of oxygen at night.  He has completed pulmonary rehab, wife who is present in the room states he may be a lung transplant candidate, has had referral placed for lung transplantation at [facility].  Patient states he knows how to perform pursed lip breathing, denies any chest pain.  Overall he is doing well from a cardiac perspective.  Says he gets annual carotid Dopplers performed through the [place], and is scheduled to have one done next year. Per Dr. Golden Pop progress note on 01/13/22,  If there is evidence of pulmonary hypertension as seen via right heart cath, he would qualify for an inhaler called treprostinil. Patient and wife deny any other questions or concerns.   Past Medical History:  Diagnosis Date   Adenomatous colon polyp    Anxiety    Aortic root dilatation ([place])    Noted on echo in August 2023.  Measured 39 mm.   Arthritis    "lower spine; fingers" (11/07/2015)   CAD (coronary artery disease)    a. 10/2015 NSTEMI/Cath: LM 40, LAD 70p, LCX 90ost, RI 90ost, RCA 60-70p, 90-33md, 40-50d; b. 10/2015 CABG x 4: LIMA->LAD, VG->Diag, VG->RI, VG->RPDA.   CKD (chronic kidney disease), stage III (HCC)    Depression    Diastolic dysfunction    a. 10/2021 Echo: EF 60-65%,   Diverticulosis    GERD (gastroesophageal reflux disease)    High cholesterol    History of bronchitis    History of Hyperthyroidism    "had it radiated in his ''13s   Hypertension 10/17/2013   Hypothyroidism    ILD (interstitial lung disease) ([place]    PAD (peripheral artery disease) (HCC)    s/p left carotid endarterectomy   Post-op atrial fibrillation (HCC) 11/06/2015   Shortness of breath    Type II diabetes mellitus ([place]    type II   Wears glasses     Past Surgical History:  Procedure Laterality Date   CARDIAC CATHETERIZATION Right 11/06/2015   Procedure: Left Heart Cath and Coronary Angiography;  Surgeon: TMinna Merritts MD;  [facility];  Service: Cardiovascular;  Laterality: Right;   CHOLECYSTECTOMY N/A 01/17/2016   Procedure: LAPAROSCOPIC CHOLECYSTECTOMY WITH INTRAOPERATIVE CHOLANGIOGRAM;  Surgeon: HFanny Skates MD;  Location: [facility]  Service: General;  Laterality: N/A;   COLONOSCOPY     COLONOSCOPY N/A 11/19/2021   Procedure: COLONOSCOPY;  Surgeon: Toledo, TBenay Pike MD;  Location: [facility];  Service: Gastroenterology;  Laterality: N/A;   CORONARY ARTERY BYPASS GRAFT N/A 11/14/2015   Procedure: CORONARY ARTERY BYPASS GRAFTING (CABG) x 4;  Surgeon: PIvin Poot MD;  [place] [facility]  Service: Open Heart Surgery;  Laterality: N/A;   ENDARTERECTOMY Left 11/14/2015   Procedure: LEFT ENDARTERECTOMY CAROTID;  Surgeon: BWaynetta Sandy MD;  Location: [facility]  Service: Vascular;  Laterality: Left;   ESOPHAGOGASTRODUODENOSCOPY N/A 11/19/2021   Procedure: ESOPHAGOGASTRODUODENOSCOPY (EGD);  Surgeon: Toledo, TBenay Pike MD;  Location: [facility];  Service: Gastroenterology;  Laterality: N/A;   FINGER GANGLION CYST EXCISION Left X 3   "index finger X 2; thumb X 1"   [place] N/A 11/16/2013   Procedure: INSERTION OF MESH;  Surgeon: MImogene Burn TGeorgette Dover MD;  [place]  Service: General;  Laterality: N/A;   IR GENERIC HISTORICAL  11/08/2015   IR PERC CHOLECYSTOSTOMY 11/08/2015 JCorrie Mckusick DO [facility]   IR GENERIC HISTORICAL  12/13/2015   IR CHOLANGIOGRAM EXISTING TUBE 12/13/2015 [facility]   IR GENERIC HISTORICAL  12/25/2015   IR CATHETER TUBE CHANGE 12/25/2015 Sandi Mariscal, MD [facility]   IR GENERIC HISTORICAL  12/12/2015   IR RADIOLOGIST EVAL & MGMT 12/12/2015 Greggory Keen, MD [facility]   TEE WITHOUT CARDIOVERSION N/A 11/14/2015   Procedure: TRANSESOPHAGEAL ECHOCARDIOGRAM (TEE);  Surgeon: Ivin Poot, MD;  Location: [place];  Service: Open Heart Surgery;  Laterality: N/A;   UMBILICAL HERNIA REPAIR N/A 11/16/2013   Procedure: UMBILICAL HERNIA REPAIR;  Surgeon: Imogene Burn. Georgette Dover, MD;  Location: [place];  Service: General;  Laterality: N/A;   VEIN HARVEST Right 11/14/2015   Procedure: RIGHT LEG GREATER SAPHENOUS VEIN HARVEST;  Surgeon: Ivin Poot, MD;  Location: [place];  Service: Open Heart Surgery;  Laterality: Right;    Current Medications: Current Meds  Medication Sig   aspirin EC 81 MG tablet Take 81 mg by mouth daily. Swallow whole.   atorvastatin (LIPITOR) 40 MG tablet Take 40 mg by mouth daily.   carvedilol (COREG) 12.5 MG tablet Take 12.5 mg by mouth 2 (two)  times daily with a meal.   furosemide (LASIX) 20 MG tablet Take 20 mg by mouth daily.   levothyroxine (SYNTHROID) 100 MCG tablet Take 100 mcg by mouth daily before breakfast.   Loperamide HCl (IMODIUM PO) Take 1 mg by mouth as needed (PT takes as needed).   Nintedanib (OFEV) 150 MG CAPS Take 1 capsule (150 mg total) by mouth 2 (two) times daily.   omeprazole (PRILOSEC) 20 MG capsule Take 1 capsule (20 mg total) by mouth daily. OFFICE VISIT REQUIRED FOR ADDITIONAL REFILLS   potassium chloride (KLOR-CON M) 10 MEQ tablet Take 2 tabs BID x3 days, then 1 tab daily   predniSONE (DELTASONE) 10 MG tablet Take 1 tablet (10 mg total) by mouth daily with breakfast.   tadalafil (CIALIS) 5 MG tablet Take 1 tablet by mouth as needed.     Allergies:   Penicillins, Amlodipine, Bupropion, Bupropion hcl, Chlorthalidone, Metformin, and Terazosin   Social History   Socioeconomic History   Marital status: Married    Spouse name: Not on file   Number of children: 2   Years of education: Not on file   Highest education level: Not on file  Occupational History   Occupation: Transport planner,    Employer: [employer]   Occupation: Civil engineer, contracting  Tobacco Use   Smoking status: Former    Packs/day: 1.00    Years: 48.00    Total pack years: 48.00    Types: Cigarettes    Quit date: 11/07/2015    Years since quitting: 6.2   Smokeless tobacco: Never  Vaping Use   Vaping Use: Never used  Substance and Sexual Activity   Alcohol use: Yes    Alcohol/week: 3.0 standard drinks of alcohol    Types: 3 Cans of beer per week    Comment: daily   Drug use: No   Sexual activity: Yes  Other Topics Concern   Not on file  Social History Narrative   Not on file   Social Determinants of Health   Financial Resource Strain: Not on file  Food Insecurity: Not on file  Transportation Needs: Not on file  Physical Activity: Not on file  Stress: Not on file  Social Connections: Not on file      Family History: The patient's family history includes Colon cancer in his mother; Depression in his mother; Diabetes  in his maternal grandmother and mother; Hypertension in his father and mother; Hyperthyroidism in his father; Lung cancer in his maternal grandmother; Prostate cancer in his father.  ROS:   Review of Systems  Constitutional: Negative.   HENT: Negative.    Eyes: Negative.   Respiratory:  Positive for shortness of breath. Negative for cough, hemoptysis, sputum production and wheezing.   Cardiovascular: Negative.   Gastrointestinal: Negative.   Genitourinary: Negative.   Musculoskeletal:  Positive for falls. Negative for back pain, joint pain, myalgias and neck pain.       Mechanical fall from carrying groceries and not having O2. No injuries and did not hit head per patient's report.   Skin: Negative.   Neurological:  Positive for dizziness. Negative for tingling, tremors, sensory change, speech change, focal weakness, seizures, loss of consciousness, weakness and headaches.       Occasional dizziness as r/t low oxygen levels per patient and wife's report.   Endo/Heme/Allergies: Negative.   Psychiatric/Behavioral: Negative.      Please see the history of present illness.    All other systems reviewed and are negative.  EKGs/Labs/Other Studies Reviewed:    The following studies were reviewed today:   EKG:  EKG is ordered today.  The ekg ordered today demonstrates SR, 74 bpm, without any acute ischemic changes.   CT Chest High Resolution on 11/13/2021: IMPRESSION: 1. The appearance of the lungs is indicative of interstitial lung disease, with minimal progression compared to the most recent prior study, but definitive progression when compared to more remote prior examinations. 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 2. Aortic atherosclerosis, in addition to left main and three-vessel coronary artery disease. Status post median sternotomy for CABG including LIMA to the LAD.   Aortic Atherosclerosis (ICD10-I70.0).  2D Echo complete on 11/12/2021: 1. Left ventricular ejection fraction, by estimation, is 60 to 65%. The  left ventricle has normal function. The left ventricle has no regional  wall motion abnormalities. There is mild left ventricular hypertrophy.  Left ventricular diastolic parameters  are consistent with Grade I diastolic dysfunction (impaired relaxation).   2. Right ventricular systolic function is normal. The right ventricular  size is normal.   3. The mitral valve is normal in structure. No evidence of mitral valve  regurgitation.   4. The aortic valve is tricuspid. Aortic valve regurgitation is not  visualized. Aortic valve sclerosis is present, with no evidence of aortic  valve stenosis.   5. Aortic dilatation noted. There is mild dilatation of the aortic root,  measuring 39 mm.   6. The inferior vena cava is normal in size with greater than 50%  respiratory variability, suggesting right atrial pressure of 3 mmHg.  Carotid, arms, and legs doppler on 11/08/2015: - Carotid evaluation;    Findings are consistent with a 60-79 percent stenosis involving    the right internal carotid artery and 80-99 percent stenosis    involving the left internal carotid artery. The vertebral    arteries demonstrate antegrade flow.  - Palmar arch;    Doppler waveform is within normal limits with radial compression    and decreases greater than 50 percent with ulnar compression.    Doppler waveform is within normal limits with radial and ulnar    compression.  - ABI;    Bilateral ABI&'s are within normal limits at rest.   Left heart cath and  coronary angiography on November 06, 2015: Dist RCA lesion, 95 %stenosed. Ramus lesion, 90 %stenosed. Ost Cx lesion, 95  %stenosed. Mid LAD lesion, 70 %stenosed. Dist LAD lesion, 60 %stenosed. Ost LM to LM lesion, 40 %stenosed.  Myoview on November 06, 2015: Pharmacological myocardial perfusion imaging study with moderate sized region of moderate intensity ischemia in the lateral wall Normal wall motion, EF estimated at 68% No EKG changes concerning for ischemia at peak stress or in recovery. High risk scan given lateral wall ischemia, given recent troponin elevation  Recent Labs: 01/30/2022: BUN 9; Creatinine, Ser 1.17; Hemoglobin 14.2; Platelets 180; Potassium 3.6; Sodium 132  Recent Lipid Panel    Component Value Date/Time   CHOL 150 10/30/2014 0812   TRIG 437 (H) 11/09/2015 0450   HDL 42.30 10/30/2014 0812   CHOLHDL 4 10/30/2014 0812   VLDL 19.8 10/30/2014 0812   LDLCALC 88 10/30/2014 0812   LDLDIRECT 140.6 02/10/2012 0848    Physical Exam:    VS:  BP 122/60 (BP Location: Left Arm, Patient Position: Sitting, Cuff Size: Normal)   Pulse 74   Ht _0  (1.702 m)   Wt 190 lb 3.2 oz (86.3 kg)   SpO2 92%   BMI 29.79 kg/m     Wt Readings from Last 3 Encounters:  01/29/22 190 lb 3.2 oz (86.3 kg)  01/13/22 196 lb 12.8 oz (89.3 kg)  12/31/21 196 lb 14.4 oz (89.3 kg)     GEN: Well nourished, well developed in no acute distress HEENT: Normal NECK: No JVD; No carotid bruits CARDIAC: S1/S2, RRR, no murmurs, rubs, gallops; 2+ peripheral pulses throughout, strong and equal bilaterally RESPIRATORY:  Diminished, scattered course breath sounds to auscultation without rales, wheezing or rhonchi  MUSCULOSKELETAL:  No edema; No deformity  SKIN: Thin skin, warm and dry NEUROLOGIC:  Alert and oriented x 3 PSYCHIATRIC:  Normal affect   ASSESSMENT:    1. Shortness of breath   2. Coronary artery disease involving native heart without angina pectoris, unspecified vessel or lesion type   3. Aortic atherosclerosis ([place])   4. PAD (peripheral artery disease) ([place])   5. S/P carotid endarterectomy   6. Atrial  fibrillation, unspecified type ([place])   7. Aortic root dilatation (HCC)   8. Chronic respiratory disease   9. Interstitial lung disease ([place])   10. Hypertension, unspecified type   11. Chronic renal failure, stage 3a (HCC)    PLAN:    In order of problems listed above:  Shortness of breath Progressive, worsening SHOB. Now on O2. Echo in 10/2021 revealed EF 60-65%. Per Dr. Golden Pop last progress note, he is requesting a right heart catheterization to be performed for patient.  Called and discussed risk versus benefits with patient on the phone, and he verbalized understanding and is agreeable to proceed.  Patient will undergo right heart catheterization on February 03, 2022 by Dr. Harrell Gave End. We will obtain preprocedure lab work including CBC and BMET prior to surgery.  Orders are signed and held.  Continue to follow-up with pulmonology and continue oxygen therapy and medication regimen.   Shared Decision Making/Informed Consent The risks [stroke (1 in 1000), death (1 in 1000), kidney failure [usually temporary] (1 in 500), bleeding (1 in 200), allergic reaction [possibly serious] (1 in 200)], benefits (diagnostic support and management of pulmonary hypertension) and alternatives of a right cardiac catheterization were discussed in detail with Mr. Capell and he is willing to proceed.   CAD, s/p CABG x 4, hx of NSTEMI in  2017, aortic atherosclerosis Underwent four-vessel CABG in 2017 with LIMA to LAD, SVG to diagonal, SVG to RI, SVG to PDA. Stable with no anginal symptoms. No indication for ischemic evaluation.  Continue aspirin, Lipitor, and carvedilol.  EKG reveals normal sinus rhythm, 74 bpm, without any acute ischemic changes.  Heart healthy diet and regular cardiovascular exercise as tolerated encouraged.   PAD, hx of left carotid endarterectomy History of left carotid endarterectomy in 2017.  60 to 79% disease on the right per his recollection.  Carotid ultrasound has been ordered and  will be done March 2024.  Continue aspirin and Lipitor. Heart healthy diet and regular cardiovascular exercise as tolerated encouraged.   A-fib Was found to be new onset A-fib with RVR in 2017 in setting of acute cholecystitis, converted to sinus rhythm after receiving IV amiodarone.  Monitor at the [place] did not reveal any A-fib, therefore he was taken off Eliquis.  Will require anticoagulation and need to be taken off Aspirin if future monitor/ EKG reveals A-fib.  He is in normal sinus rhythm today and denies any tachycardia or palpitations.  Continue carvedilol and aspirin.  5. Aortic root dilatation Mild dilatation of aortic root noted on echocardiogram in August 2023.  Measured 39 mm.  Recommend updating 2D echocardiogram within the next year for monitoring.  6. Chronic respiratory disease/ ILD Etiology multifactorial and r/t occupational exposure also has history of former tobacco abuse.  Requiring 4 to 6 L of oxygen with exertion and does not require oxygen at rest.  Continue albuterol, Pulmicort, DuoNeb nebulizer as needed, nintedanib, and oxygen therapy.  Arranging right cardiac catheterization as mentioned above in #1 to evaluate for pulmonary HTN.  Continue to follow-up with pulmonology.  Has had previous referral placed by Dr. Chase Caller for lung transplantation at [facility].  7. HTN BP today 122/60. Well controlled BP at home. On Carvedilol and will route note to Dr. Chase Caller to see if need to switch to Metoprolol.   8. CKD stage 3a Last serum creatinine on file 1.40, with eGFR 51.  Will obtain BMET as mentioned above.  9.  Disposition: Follow-up in 3 to 4 months with Dr. Rockey Situ or sooner if anything changes.   Medication Adjustments/Labs and Tests Ordered: Current medicines are reviewed at length with the patient today.  Concerns regarding medicines are outlined above.  Orders Placed This Encounter  Procedures   EKG 12-Lead   No orders of the defined types were placed in this  encounter.   Patient Instructions  Medication Instructions:   Your physician recommends that you continue on your current medications as directed. Please refer to the Current Medication list given to you today.   *If you need a refill on your cardiac medications before your next appointment, please call your pharmacy*   Lab Work: [place]    If you have labs (blood work) drawn today and your tests are completely normal, you will receive your results only by: [place] (if you have MyChart) OR A paper copy in the mail If you have any lab test that is abnormal or we need to change your treatment, we will call you to review the results.   Testing/Procedures: NONE ORDERED  TODAY      Follow-Up: At [facility], you and your health needs are our priority.  As part of our continuing mission to provide you with exceptional heart care, we have created designated Provider Care Teams.  These Care Teams include your primary Cardiologist (physician)  and Advanced Practice Providers (APPs -  Physician Assistants and Nurse Practitioners) who all work together to provide you with the care you need, when you need it.  We recommend signing up for the patient portal called "MyChart".  Sign up information is provided on this After Visit Summary.  MyChart is used to connect with patients for Virtual Visits (Telemedicine).  Patients are able to view lab/test results, encounter notes, upcoming appointments, etc.  Non-urgent messages can be sent to your provider as well.   To learn more about what you can do with MyChart, go to [email or web address].    Your next appointment:   3-4  month(s)  The format for your next appointment:   In Person  Provider:   Ida Rogue, MD    Other Instructions   Important Information About [place]         Signed, Finis Bud, NP  01/30/2022 5:30 PM    [place]

## 2022-01-28 NOTE — Telephone Encounter (Signed)
Called patient back and he states he is needing the ONO and his walk test faxed to the [place] so he does not loose his oxygen. Found ONO in mychart messages and the walk test from office visit. Faxed to [place] as requested. Nothing further needed

## 2022-01-28 NOTE — Progress Notes (Addendum)
Cardiology Office Note:    Date:  01/29/2022   ID:  Brian Casey, DOB 07/11/1950, MRN [id number]  PCP:  Hedrick, James, MD   [facility] Providers Cardiologist:  Timothy Gollan, MD      Referring MD: Hedrick, James, MD   Chief Complaint  Patient presents with   Consult for Cath    Ramaswamy wanted a consult for a cath. Patient has shortness of breath. Meds reviewed with patient.     History of Present Illness:    Brian Casey is a 71 y.o. male with a hx of the following:  AF, converted to NSR, not on Eliquis HTN CAD, s/p NSTEMI, s/p CABG x 4 in 2017 Aorthic atherosclerosis Bilateral Carotid stenosis, s/p left carotid endarterectomy in 2017 Grade 2 DD -> Grade 1 DD CKD stage 3a Hx of remote alcohol abuse Hypothyroidism HLD Former tobacco abuse for 40 years, quit in 2017 ILD (followed by Pulmonary)  In 2017 he had a 3-day history of right upper quadrant pain, CT of abdomen and pelvis showed acute cholecystitis.  He was noted to be in new onset A-fib with RVR, troponin peaked at 0.11, dx with NSTEMI. A-fib was rate controlled and ultimately converted to sinus rhythm on IV amiodarone.  Underwent nuclear medicine stress test that was abnormal and noted to be at high risk as imaging study revealed moderate size region of moderate intensity ischemia in lateral wall, EF estimated at 68%.  Left heart cath performed by Dr. Gollan revealed multivessel disease and underwent four-vessel CABG in 2017 with LIMA to LAD, SVG to diagonal, SVG to RI, SVG to PDA.  Was also found to have severe L ICA stenosis at 80 to 99%, underwent successful left carotid endarterectomy in 2017, right ICA stenosis was found to be 60 to 79%.   [facility] and had a previous history of wearing a cardiac monitor that did not reveal any A-fib, [facility].  History of occupational exposure to asbestos, worked as an underground lineman.  Has been followed by  pulmonology (Dr. Ramaswamy) for diagnosis of interstitial lung disease, has had chronic shortness of breath that has become progressively worse recently.  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  He was doing lung works program, was scheduled to start oxygen.  CT of the chest revealed coronary artery calcifications and aortic atherosclerosis.    Last seen by Dr. Gollan on November 05, 2021.  Patient was noted to have progressive shortness of breath related to ILD. Echocardiogram was ordered by Dr. Gollan and revealed normal LV and RV size and function, no significant valvular disease, no indication of pulmonary hypertension or high pressures, aortic dilatation of aortic root at 39 mm. Patient stated he was scheduled to start on oxygen and was weaning off prednisone with possible goal biopsy in the future.  His overnight oximetry readings were pending.   Today he presents for follow-up.  He states his pulmonologist, Dr. Ramaswamy, is requesting a heart catheterization to be performed.  He stated his pulse oximetry readings revealed drops in oxygen at night as well as with exertion.  He states at rest he does not require any oxygen however brushing his teeth or doing more physical activity requires between 4 to 6 L of continuous oxygen.  He found his O2 sats dropped to 70% while sleeping at night,   he is scheduled to wear 2 L of oxygen at night.  He has completed pulmonary rehab, wife who is present in the room states he may be a lung transplant candidate, has had referral placed for lung transplantation at [facility].  Patient states he knows how to perform pursed lip breathing, denies any chest pain.  Overall he is doing well from a cardiac perspective.  Says he gets annual carotid Dopplers performed through the [place], and is scheduled to have one done next year. Per Dr. Golden Pop progress note on 01/13/22,  If there is evidence of pulmonary hypertension as seen via right heart cath, he would qualify for an inhaler called treprostinil. Patient and wife deny any other questions or concerns.   Past Medical History:  Diagnosis Date   Adenomatous colon polyp    Anxiety    Aortic root dilatation ([place])    Noted on echo in August 2023.  Measured 39 mm.   Arthritis    "lower spine; fingers" (11/07/2015)   CAD (coronary artery disease)    a. 10/2015 NSTEMI/Cath: LM 40, LAD 70p, LCX 90ost, RI 90ost, RCA 60-70p, 90-33md, 40-50d; b. 10/2015 CABG x 4: LIMA->LAD, VG->Diag, VG->RI, VG->RPDA.   CKD (chronic kidney disease), stage III (HCC)    Depression    Diastolic dysfunction    a. 10/2021 Echo: EF 60-65%,   Diverticulosis    GERD (gastroesophageal reflux disease)    High cholesterol    History of bronchitis    History of Hyperthyroidism    "had it radiated in his ''13s   Hypertension 10/17/2013   Hypothyroidism    ILD (interstitial lung disease) ([place]    PAD (peripheral artery disease) (HCC)    s/p left carotid endarterectomy   Post-op atrial fibrillation (HCC) 11/06/2015   Shortness of breath    Type II diabetes mellitus ([place]    type II   Wears glasses     Past Surgical History:  Procedure Laterality Date   CARDIAC CATHETERIZATION Right 11/06/2015   Procedure: Left Heart Cath and Coronary Angiography;  Surgeon: TMinna Merritts MD;  [facility];  Service: Cardiovascular;  Laterality: Right;   CHOLECYSTECTOMY N/A 01/17/2016   Procedure: LAPAROSCOPIC CHOLECYSTECTOMY WITH INTRAOPERATIVE CHOLANGIOGRAM;  Surgeon: HFanny Skates MD;  Location: [facility]  Service: General;  Laterality: N/A;   COLONOSCOPY     COLONOSCOPY N/A 11/19/2021   Procedure: COLONOSCOPY;  Surgeon: Toledo, TBenay Pike MD;  Location: [facility];  Service: Gastroenterology;  Laterality: N/A;   CORONARY ARTERY BYPASS GRAFT N/A 11/14/2015   Procedure: CORONARY ARTERY BYPASS GRAFTING (CABG) x 4;  Surgeon: PIvin Poot MD;  [place]  Service: Open Heart Surgery;  Laterality: N/A;   ENDARTERECTOMY Left 11/14/2015   Procedure: LEFT ENDARTERECTOMY CAROTID;  Surgeon: BWaynetta Sandy MD;  Location: [facility]  Service: Vascular;  Laterality: Left;   ESOPHAGOGASTRODUODENOSCOPY N/A 11/19/2021   Procedure: ESOPHAGOGASTRODUODENOSCOPY (EGD);  Surgeon: Toledo, TBenay Pike MD;  Location: [facility];  Service: Gastroenterology;  Laterality: N/A;   FINGER GANGLION CYST EXCISION Left X 3   "index finger X 2; thumb X 1"   [place] N/A 11/16/2013   Procedure: INSERTION OF MESH;  Surgeon: MImogene Burn TGeorgette Dover MD;  [place]  Service: General;  Laterality: N/A;   IR GENERIC HISTORICAL  11/08/2015   IR PERC CHOLECYSTOSTOMY 11/08/2015 JCorrie Mckusick DO [facility]   IR GENERIC HISTORICAL  12/13/2015   IR CHOLANGIOGRAM EXISTING TUBE 12/13/2015 [facility]   IR GENERIC HISTORICAL  12/25/2015   IR CATHETER TUBE CHANGE 12/25/2015 Sandi Mariscal, MD [facility]   IR GENERIC HISTORICAL  12/12/2015   IR RADIOLOGIST EVAL & MGMT 12/12/2015 Greggory Keen, MD [facility]   TEE WITHOUT CARDIOVERSION N/A 11/14/2015   Procedure: TRANSESOPHAGEAL ECHOCARDIOGRAM (TEE);  Surgeon: Ivin Poot, MD;  Location[place];  Service: Open Heart Surgery;  Laterality: N/A;   UMBILICAL HERNIA REPAIR N/A 11/16/2013   Procedure: UMBILICAL HERNIA REPAIR;  Surgeon: Imogene Burn. Georgette Dover, MD;  Location: [place];  Service: General;  Laterality: N/A;   VEIN HARVEST Right 11/14/2015   Procedure: RIGHT LEG GREATER SAPHENOUS VEIN HARVEST;  Surgeon: Ivin Poot, MD;  Location: [place];  Service: Open Heart Surgery;  Laterality: Right;    Current Medications: Current Meds  Medication Sig   aspirin EC 81 MG tablet Take 81 mg by mouth daily. Swallow whole.   atorvastatin (LIPITOR) 40 MG tablet Take 40 mg by mouth daily.   carvedilol (COREG) 12.5 MG tablet Take 12.5 mg by mouth 2 (two)  times daily with a meal.   furosemide (LASIX) 20 MG tablet Take 20 mg by mouth daily.   levothyroxine (SYNTHROID) 100 MCG tablet Take 100 mcg by mouth daily before breakfast.   Loperamide HCl (IMODIUM PO) Take 1 mg by mouth as needed (PT takes as needed).   Nintedanib (OFEV) 150 MG CAPS Take 1 capsule (150 mg total) by mouth 2 (two) times daily.   omeprazole (PRILOSEC) 20 MG capsule Take 1 capsule (20 mg total) by mouth daily. OFFICE VISIT REQUIRED FOR ADDITIONAL REFILLS   potassium chloride (KLOR-CON M) 10 MEQ tablet Take 2 tabs BID x3 days, then 1 tab daily   predniSONE (DELTASONE) 10 MG tablet Take 1 tablet (10 mg total) by mouth daily with breakfast.   tadalafil (CIALIS) 5 MG tablet Take 1 tablet by mouth as needed.     Allergies:   Penicillins, Amlodipine, Bupropion, Bupropion hcl, Chlorthalidone, Metformin, and Terazosin   Social History   Socioeconomic History   Marital status: Married    Spouse name: Not on file   Number of children: 2   Years of education: Not on file   Highest education level: Not on file  Occupational History   Occupation: Transport planner,    Employer: [employer]   Occupation: Civil engineer, contracting  Tobacco Use   Smoking status: Former    Packs/day: 1.00    Years: 48.00    Total pack years: 48.00    Types: Cigarettes    Quit date: 11/07/2015    Years since quitting: 6.2   Smokeless tobacco: Never  Vaping Use   Vaping Use: Never used  Substance and Sexual Activity   Alcohol use: Yes    Alcohol/week: 3.0 standard drinks of alcohol    Types: 3 Cans of beer per week    Comment: daily   Drug use: No   Sexual activity: Yes  Other Topics Concern   Not on file  Social History Narrative   Not on file   Social Determinants of Health   Financial Resource Strain: Not on file  Food Insecurity: Not on file  Transportation Needs: Not on file  Physical Activity: Not on file  Stress: Not on file  Social Connections: Not on file      Family History: The patient's family history includes Colon cancer in his mother; Depression in his mother; Diabetes  in his maternal grandmother and mother; Hypertension in his father and mother; Hyperthyroidism in his father; Lung cancer in his maternal grandmother; Prostate cancer in his father.  ROS:   Review of Systems  Constitutional: Negative.   HENT: Negative.    Eyes: Negative.   Respiratory:  Positive for shortness of breath. Negative for cough, hemoptysis, sputum production and wheezing.   Cardiovascular: Negative.   Gastrointestinal: Negative.   Genitourinary: Negative.   Musculoskeletal:  Positive for falls. Negative for back pain, joint pain, myalgias and neck pain.       Mechanical fall from carrying groceries and not having O2. No injuries and did not hit head per patient's report.   Skin: Negative.   Neurological:  Positive for dizziness. Negative for tingling, tremors, sensory change, speech change, focal weakness, seizures, loss of consciousness, weakness and headaches.       Occasional dizziness as r/t low oxygen levels per patient and wife's report.   Endo/Heme/Allergies: Negative.   Psychiatric/Behavioral: Negative.      Please see the history of present illness.    All other systems reviewed and are negative.  EKGs/Labs/Other Studies Reviewed:    The following studies were reviewed today:   EKG:  EKG is ordered today.  The ekg ordered today demonstrates SR, 74 bpm, without any acute ischemic changes.   CT Chest High Resolution on 11/13/2021: IMPRESSION: 1. The appearance of the lungs is indicative of interstitial lung disease, with minimal progression compared to the most recent prior study, but definitive progression when compared to more remote prior examinations. 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 2. Aortic atherosclerosis, in addition to left main and three-vessel coronary artery disease. Status post median sternotomy for CABG including LIMA to the LAD.   Aortic Atherosclerosis (ICD10-I70.0).  2D Echo complete on 11/12/2021: 1. Left ventricular ejection fraction, by estimation, is 60 to 65%. The  left ventricle has normal function. The left ventricle has no regional  wall motion abnormalities. There is mild left ventricular hypertrophy.  Left ventricular diastolic parameters  are consistent with Grade I diastolic dysfunction (impaired relaxation).   2. Right ventricular systolic function is normal. The right ventricular  size is normal.   3. The mitral valve is normal in structure. No evidence of mitral valve  regurgitation.   4. The aortic valve is tricuspid. Aortic valve regurgitation is not  visualized. Aortic valve sclerosis is present, with no evidence of aortic  valve stenosis.   5. Aortic dilatation noted. There is mild dilatation of the aortic root,  measuring 39 mm.   6. The inferior vena cava is normal in size with greater than 50%  respiratory variability, suggesting right atrial pressure of 3 mmHg.  Carotid, arms, and legs doppler on 11/08/2015: - Carotid evaluation;    Findings are consistent with a 60-79 percent stenosis involving    the right internal carotid artery and 80-99 percent stenosis    involving the left internal carotid artery. The vertebral    arteries demonstrate antegrade flow.  - Palmar arch;    Doppler waveform is within normal limits with radial compression    and decreases greater than 50 percent with ulnar compression.    Doppler waveform is within normal limits with radial and ulnar    compression.  - ABI;    Bilateral ABI&'s are within normal limits at rest.   Left heart cath and  coronary angiography on November 06, 2015: Dist RCA lesion, 95 %stenosed. Ramus lesion, 90 %stenosed. Ost Cx lesion, 95  %stenosed. Mid LAD lesion, 70 %stenosed. Dist LAD lesion, 60 %stenosed. Ost LM to LM lesion, 40 %stenosed.  Myoview on November 06, 2015: Pharmacological myocardial perfusion imaging study with moderate sized region of moderate intensity ischemia in the lateral wall Normal wall motion, EF estimated at 68% No EKG changes concerning for ischemia at peak stress or in recovery. High risk scan given lateral wall ischemia, given recent troponin elevation  Recent Labs: 01/30/2022: BUN 9; Creatinine, Ser 1.17; Hemoglobin 14.2; Platelets 180; Potassium 3.6; Sodium 132  Recent Lipid Panel    Component Value Date/Time   CHOL 150 10/30/2014 0812   TRIG 437 (H) 11/09/2015 0450   HDL 42.30 10/30/2014 0812   CHOLHDL 4 10/30/2014 0812   VLDL 19.8 10/30/2014 0812   LDLCALC 88 10/30/2014 0812   LDLDIRECT 140.6 02/10/2012 0848    Physical Exam:    VS:  BP 122/60 (BP Location: Left Arm, Patient Position: Sitting, Cuff Size: Normal)   Pulse 74   Ht _0  (1.702 m)   Wt 190 lb 3.2 oz (86.3 kg)   SpO2 92%   BMI 29.79 kg/m     Wt Readings from Last 3 Encounters:  01/29/22 190 lb 3.2 oz (86.3 kg)  01/13/22 196 lb 12.8 oz (89.3 kg)  12/31/21 196 lb 14.4 oz (89.3 kg)     GEN: Well nourished, well developed in no acute distress HEENT: Normal NECK: No JVD; No carotid bruits CARDIAC: S1/S2, RRR, no murmurs, rubs, gallops; 2+ peripheral pulses throughout, strong and equal bilaterally RESPIRATORY:  Diminished, scattered course breath sounds to auscultation without rales, wheezing or rhonchi  MUSCULOSKELETAL:  No edema; No deformity  SKIN: Thin skin, warm and dry NEUROLOGIC:  Alert and oriented x 3 PSYCHIATRIC:  Normal affect   ASSESSMENT:    1. Shortness of breath   2. Coronary artery disease involving native heart without angina pectoris, unspecified vessel or lesion type   3. Aortic atherosclerosis ([place])   4. PAD (peripheral artery disease) ([place])   5. S/P carotid endarterectomy   6. Atrial  fibrillation, unspecified type ([place])   7. Aortic root dilatation (HCC)   8. Chronic respiratory disease   9. Interstitial lung disease ([place])   10. Hypertension, unspecified type   11. Chronic renal failure, stage 3a (HCC)    PLAN:    In order of problems listed above:  Shortness of breath Progressive, worsening SHOB. Now on O2. Echo in 10/2021 revealed EF 60-65%. Per Dr. Golden Pop last progress note, he is requesting a right heart catheterization to be performed for patient.  Called and discussed risk versus benefits with patient on the phone, and he verbalized understanding and is agreeable to proceed.  Patient will undergo right heart catheterization on February 03, 2022 by Dr. Harrell Gave End. We will obtain preprocedure lab work including CBC and BMET prior to surgery.  Orders are signed and held.  Continue to follow-up with pulmonology and continue oxygen therapy and medication regimen.   Shared Decision Making/Informed Consent The risks [stroke (1 in 1000), death (1 in 1000), kidney failure [usually temporary] (1 in 500), bleeding (1 in 200), allergic reaction [possibly serious] (1 in 200)], benefits (diagnostic support and management of pulmonary hypertension) and alternatives of a right cardiac catheterization were discussed in detail with Mr. Perin and he is willing to proceed.   CAD, s/p CABG x 4, hx of NSTEMI in  2017, aortic atherosclerosis Underwent four-vessel CABG in 2017 with LIMA to LAD, SVG to diagonal, SVG to RI, SVG to PDA. Stable with no anginal symptoms. No indication for ischemic evaluation.  Continue aspirin, Lipitor, and carvedilol.  EKG reveals normal sinus rhythm, 74 bpm, without any acute ischemic changes.  Heart healthy diet and regular cardiovascular exercise as tolerated encouraged.   PAD, hx of left carotid endarterectomy History of left carotid endarterectomy in 2017.  60 to 79% disease on the right per his recollection.  Carotid ultrasound has been ordered and  will be done March 2024.  Continue aspirin and Lipitor. Heart healthy diet and regular cardiovascular exercise as tolerated encouraged.   A-fib Was found to be new onset A-fib with RVR in 2017 in setting of acute cholecystitis, converted to sinus rhythm after receiving IV amiodarone.  Monitor at the [place] did not reveal any A-fib, therefore he was taken off Eliquis.  Will require anticoagulation and need to be taken off Aspirin if future monitor/ EKG reveals A-fib.  He is in normal sinus rhythm today and denies any tachycardia or palpitations.  Continue carvedilol and aspirin.  5. Aortic root dilatation Mild dilatation of aortic root noted on echocardiogram in August 2023.  Measured 39 mm.  Recommend updating 2D echocardiogram within the next year for monitoring.  6. Chronic respiratory disease/ ILD Etiology multifactorial and r/t occupational exposure also has history of former tobacco abuse.  Requiring 4 to 6 L of oxygen with exertion and does not require oxygen at rest.  Continue albuterol, Pulmicort, DuoNeb nebulizer as needed, nintedanib, and oxygen therapy.  Arranging right cardiac catheterization as mentioned above in #1 to evaluate for pulmonary HTN.  Continue to follow-up with pulmonology.  Has had previous referral placed by Dr. Chase Caller for lung transplantation at [facility].  7. HTN BP today 122/60. Well controlled BP at home. On Carvedilol and will route note to Dr. Chase Caller to see if need to switch to Metoprolol.   8. CKD stage 3a Last serum creatinine on file 1.40, with eGFR 51.  Will obtain BMET as mentioned above.  9.  Disposition: Follow-up in 3 to 4 months with Dr. Rockey Situ or sooner if anything changes.   Medication Adjustments/Labs and Tests Ordered: Current medicines are reviewed at length with the patient today.  Concerns regarding medicines are outlined above.  Orders Placed This Encounter  Procedures   EKG 12-Lead   No orders of the defined types were placed in this  encounter.   Patient Instructions  Medication Instructions:   Your physician recommends that you continue on your current medications as directed. Please refer to the Current Medication list given to you today.   *If you need a refill on your cardiac medications before your next appointment, please call your pharmacy*   Lab Work: [place]    If you have labs (blood work) drawn today and your tests are completely normal, you will receive your results only by: [place] (if you have MyChart) OR A paper copy in the mail If you have any lab test that is abnormal or we need to change your treatment, we will call you to review the results.   Testing/Procedures: NONE ORDERED  TODAY      Follow-Up: At [facility], you and your health needs are our priority.  As part of our continuing mission to provide you with exceptional heart care, we have created designated Provider Care Teams.  These Care Teams include your primary Cardiologist (physician)  and Advanced Practice Providers (APPs -  Physician Assistants and Nurse Practitioners) who all work together to provide you with the care you need, when you need it.  We recommend signing up for the patient portal called "MyChart".  Sign up information is provided on this After Visit Summary.  MyChart is used to connect with patients for Virtual Visits (Telemedicine).  Patients are able to view lab/test results, encounter notes, upcoming appointments, etc.  Non-urgent messages can be sent to your provider as well.   To learn more about what you can do with MyChart, go to [email or web address].    Your next appointment:   3-4  month(s)  The format for your next appointment:   In Person  Provider:   Ida Rogue, MD    Other Instructions   Important Information About [place]         Signed, Finis Bud, NP  01/30/2022 5:30 PM    [place]

## 2022-01-29 ENCOUNTER — Telehealth: Payer: Self-pay | Admitting: Nurse Practitioner

## 2022-01-29 ENCOUNTER — Telehealth: Payer: Self-pay | Admitting: Internal Medicine

## 2022-01-29 ENCOUNTER — Ambulatory Visit: Payer: No Typology Code available for payment source | Attending: Nurse Practitioner | Admitting: Nurse Practitioner

## 2022-01-29 ENCOUNTER — Encounter: Payer: Self-pay | Admitting: Nurse Practitioner

## 2022-01-29 VITALS — BP 122/60 | HR 74 | Ht 67.0 in | Wt 190.2 lb

## 2022-01-29 DIAGNOSIS — I480 Paroxysmal atrial fibrillation: Secondary | ICD-10-CM

## 2022-01-29 DIAGNOSIS — I4891 Unspecified atrial fibrillation: Secondary | ICD-10-CM | POA: Diagnosis not present

## 2022-01-29 DIAGNOSIS — J849 Interstitial pulmonary disease, unspecified: Secondary | ICD-10-CM

## 2022-01-29 DIAGNOSIS — I700 Atherosclerosis of aorta: Secondary | ICD-10-CM

## 2022-01-29 DIAGNOSIS — I2510 Atherosclerotic heart disease of native coronary artery without angina pectoris: Secondary | ICD-10-CM | POA: Diagnosis not present

## 2022-01-29 DIAGNOSIS — N1831 Chronic kidney disease, stage 3a: Secondary | ICD-10-CM

## 2022-01-29 DIAGNOSIS — R0602 Shortness of breath: Secondary | ICD-10-CM

## 2022-01-29 DIAGNOSIS — I739 Peripheral vascular disease, unspecified: Secondary | ICD-10-CM | POA: Diagnosis not present

## 2022-01-29 DIAGNOSIS — Z01812 Encounter for preprocedural laboratory examination: Secondary | ICD-10-CM

## 2022-01-29 DIAGNOSIS — I25118 Atherosclerotic heart disease of native coronary artery with other forms of angina pectoris: Secondary | ICD-10-CM

## 2022-01-29 DIAGNOSIS — J989 Respiratory disorder, unspecified: Secondary | ICD-10-CM

## 2022-01-29 DIAGNOSIS — I10 Essential (primary) hypertension: Secondary | ICD-10-CM

## 2022-01-29 DIAGNOSIS — I77810 Thoracic aortic ectasia: Secondary | ICD-10-CM

## 2022-01-29 DIAGNOSIS — Z9889 Other specified postprocedural states: Secondary | ICD-10-CM

## 2022-01-29 NOTE — Patient Instructions (Signed)
Medication Instructions:   Your physician recommends that you continue on your current medications as directed. Please refer to the Current Medication list given to you today.   *If you need a refill on your cardiac medications before your next appointment, please call your pharmacy*   Lab Work: [place]    If you have labs (blood work) drawn today and your tests are completely normal, you will receive your results only by: [place] (if you have MyChart) OR A paper copy in the mail If you have any lab test that is abnormal or we need to change your treatment, we will call you to review the results.   Testing/Procedures: NONE ORDERED  TODAY      Follow-Up: At [facility], you and your health needs are our priority.  As part of our continuing mission to provide you with exceptional heart care, we have created designated Provider Care Teams.  These Care Teams include your primary Cardiologist (physician) and Advanced Practice Providers (APPs -  Physician Assistants and Nurse Practitioners) who all work together to provide you with the care you need, when you need it.  We recommend signing up for the patient portal called "MyChart".  Sign up information is provided on this After Visit Summary.  MyChart is used to connect with patients for Virtual Visits (Telemedicine).  Patients are able to view lab/test results, encounter notes, upcoming appointments, etc.  Non-urgent messages can be sent to your provider as well.   To learn more about what you can do with MyChart, go to [email or web address].    Your next appointment:   3-4  month(s)  The format for your next appointment:   In Person  Provider:   Ida Rogue, MD    Other Instructions   Important Information About Sugar

## 2022-01-29 NOTE — Telephone Encounter (Signed)
Patient in today for appointment and needs Right heart cath with labs. Will reach out to patient once we get it scheduled and review instructions. Left voicemail message with scheduling.

## 2022-01-29 NOTE — Telephone Encounter (Signed)
[facility] Cat to return call.

## 2022-01-30 ENCOUNTER — Encounter: Payer: Self-pay | Admitting: *Deleted

## 2022-01-30 ENCOUNTER — Encounter: Payer: Self-pay | Admitting: Nurse Practitioner

## 2022-01-30 ENCOUNTER — Other Ambulatory Visit
Admission: RE | Admit: 2022-01-30 | Discharge: 2022-01-30 | Disposition: A | Discharge status: Home / Self Care | Payer: No Typology Code available for payment source | Attending: Nurse Practitioner | Admitting: Nurse Practitioner

## 2022-01-30 DIAGNOSIS — I480 Paroxysmal atrial fibrillation: Secondary | ICD-10-CM | POA: Diagnosis present

## 2022-01-30 DIAGNOSIS — I25118 Atherosclerotic heart disease of native coronary artery with other forms of angina pectoris: Secondary | ICD-10-CM | POA: Insufficient documentation

## 2022-01-30 DIAGNOSIS — R0602 Shortness of breath: Secondary | ICD-10-CM | POA: Diagnosis present

## 2022-01-30 LAB — BASIC METABOLIC PANEL
Anion gap: 10 (ref 5–15)
BUN: 9 mg/dL (ref 8–23)
CO2: 26 mmol/L (ref 22–32)
Calcium: 8.6 mg/dL — ABNORMAL LOW (ref 8.9–10.3)
Chloride: 96 mmol/L — ABNORMAL LOW (ref 98–111)
Creatinine, Ser: 1.17 mg/dL (ref 0.61–1.24)
GFR, Estimated: >60 mL/min (ref >60)
Glucose, Bld: 244 mg/dL — ABNORMAL HIGH (ref 70–99)
Potassium: 3.6 mmol/L (ref 3.5–5.1)
Sodium: 132 mmol/L — ABNORMAL LOW (ref 135–145)

## 2022-01-30 LAB — CBC
HCT: 39.4 % (ref 39.0–52.0)
Hemoglobin: 14.2 g/dL (ref 13.0–17.0)
MCH: 35.9 pg — ABNORMAL HIGH (ref 26.0–34.0)
MCHC: 36 g/dL (ref 30.0–36.0)
MCV: 99.5 fL (ref 80.0–100.0)
Platelets: 180 10*3/uL (ref 150–400)
RBC: 3.96 MIL/uL — ABNORMAL LOW (ref 4.22–5.81)
RDW: 12.6 % (ref 11.5–15.5)
WBC: 10.9 10*3/uL — ABNORMAL HIGH (ref 4.0–10.5)
nRBC: 0 % (ref 0.0–0.2)

## 2022-01-30 NOTE — Addendum Note (Signed)
Addended by: Gavin Potters R on: 01/30/2022 08:57 AM   Modules accepted: Orders

## 2022-01-30 NOTE — Telephone Encounter (Signed)
Faxed O2 order from 11/04/21 for daytime O2 as well as sent and faxed over order for night time O2 at 2 LPM to [place] at [place] with the [place].  Nothing further needed at this time.

## 2022-01-30 NOTE — Telephone Encounter (Signed)
Patients wife came into office to pick up letter with procedure instructions. Reviewed them in detail with her and encouraged her to call back if she should have any questions. She verbalized understanding with no further questions at this time.

## 2022-01-30 NOTE — Telephone Encounter (Signed)
Spoke with patient and reviewed that his heart cath has been scheduled for Tuesday 02/03/22 with arrival time of 10:30 am. Advised that we would like him to have labs done today and to pick up the letter with instructions. He reports not feeling well today and does not want to get out and wanted to just wait until the morning of his procedure. Reviewed that we need those labs prior to that day and we also need to give him a letter with all instructions for his procedure. He requested if we could mail it to him and discussed that it would not get to him in time since his procedure is on Tuesday. Strongly encouraged him to please come today to have labs done at the [facility] and then come by our office for letter with instructions. He was agreeable with plan and had no further questions at this time.

## 2022-02-02 ENCOUNTER — Telehealth: Payer: Self-pay | Admitting: Cardiovascular Disease

## 2022-02-02 NOTE — Telephone Encounter (Signed)
Pt spouse calling for some clarity about heart cath instructions

## 2022-02-02 NOTE — Telephone Encounter (Signed)
Left message for pt to call back.

## 2022-02-03 ENCOUNTER — Ambulatory Visit
Admission: RE | Admit: 2022-02-03 | Discharge: 2022-02-03 | Disposition: A | Discharge status: Home / Self Care | Payer: No Typology Code available for payment source | Attending: Internal Medicine | Admitting: Internal Medicine

## 2022-02-03 ENCOUNTER — Encounter
Admission: RE | Disposition: A | Discharge status: Home / Self Care | Payer: Self-pay | Source: Home / Self Care | Attending: Internal Medicine

## 2022-02-03 ENCOUNTER — Other Ambulatory Visit: Payer: Self-pay

## 2022-02-03 ENCOUNTER — Encounter: Payer: Self-pay | Admitting: Internal Medicine

## 2022-02-03 DIAGNOSIS — N1831 Chronic kidney disease, stage 3a: Secondary | ICD-10-CM | POA: Insufficient documentation

## 2022-02-03 DIAGNOSIS — Z951 Presence of aortocoronary bypass graft: Secondary | ICD-10-CM | POA: Diagnosis not present

## 2022-02-03 DIAGNOSIS — Z7982 Long term (current) use of aspirin: Secondary | ICD-10-CM | POA: Insufficient documentation

## 2022-02-03 DIAGNOSIS — I739 Peripheral vascular disease, unspecified: Secondary | ICD-10-CM | POA: Diagnosis not present

## 2022-02-03 DIAGNOSIS — Z79899 Other long term (current) drug therapy: Secondary | ICD-10-CM | POA: Insufficient documentation

## 2022-02-03 DIAGNOSIS — J849 Interstitial pulmonary disease, unspecified: Secondary | ICD-10-CM | POA: Insufficient documentation

## 2022-02-03 DIAGNOSIS — R0602 Shortness of breath: Secondary | ICD-10-CM | POA: Diagnosis present

## 2022-02-03 DIAGNOSIS — E039 Hypothyroidism, unspecified: Secondary | ICD-10-CM | POA: Diagnosis not present

## 2022-02-03 DIAGNOSIS — I252 Old myocardial infarction: Secondary | ICD-10-CM | POA: Insufficient documentation

## 2022-02-03 DIAGNOSIS — I6523 Occlusion and stenosis of bilateral carotid arteries: Secondary | ICD-10-CM | POA: Diagnosis not present

## 2022-02-03 DIAGNOSIS — I129 Hypertensive chronic kidney disease with stage 1 through stage 4 chronic kidney disease, or unspecified chronic kidney disease: Secondary | ICD-10-CM | POA: Insufficient documentation

## 2022-02-03 DIAGNOSIS — I700 Atherosclerosis of aorta: Secondary | ICD-10-CM | POA: Diagnosis not present

## 2022-02-03 DIAGNOSIS — I2510 Atherosclerotic heart disease of native coronary artery without angina pectoris: Secondary | ICD-10-CM | POA: Diagnosis not present

## 2022-02-03 DIAGNOSIS — I272 Pulmonary hypertension, unspecified: Secondary | ICD-10-CM

## 2022-02-03 DIAGNOSIS — I4891 Unspecified atrial fibrillation: Secondary | ICD-10-CM

## 2022-02-03 DIAGNOSIS — E785 Hyperlipidemia, unspecified: Secondary | ICD-10-CM | POA: Insufficient documentation

## 2022-02-03 DIAGNOSIS — Z87891 Personal history of nicotine dependence: Secondary | ICD-10-CM | POA: Diagnosis not present

## 2022-02-03 HISTORY — PX: RIGHT HEART CATH: CATH118263

## 2022-02-03 LAB — POCT I-STAT EG7
Acid-Base Excess: 2 mmol/L (ref 0.0–2.0)
Bicarbonate: 26.2 mmol/L (ref 20.0–28.0)
Calcium, Ion: 1.16 mmol/L (ref 1.15–1.40)
HCT: 36 % — ABNORMAL LOW (ref 39.0–52.0)
Hemoglobin: 12.2 g/dL — ABNORMAL LOW (ref 13.0–17.0)
O2 Saturation: 72 %
Potassium: 3 mmol/L — ABNORMAL LOW (ref 3.5–5.1)
Sodium: 135 mmol/L (ref 135–145)
TCO2: 27 mmol/L (ref 22–32)
pCO2, Ven: 38.6 mmHg — ABNORMAL LOW (ref 44–60)
pH, Ven: 7.441 — ABNORMAL HIGH (ref 7.25–7.43)
pO2, Ven: 36 mmHg (ref 32–45)

## 2022-02-03 SURGERY — RIGHT HEART CATH
Anesthesia: Moderate Sedation | Laterality: Right

## 2022-02-03 MED ORDER — SODIUM CHLORIDE 0.9% FLUSH: 3.0000 mL | INTRAVENOUS | Status: DC | PRN

## 2022-02-03 MED ORDER — LIDOCAINE HCL 1 % IJ SOLN
INTRAMUSCULAR | Status: AC
  Filled 2022-02-03: qty 20

## 2022-02-03 MED ORDER — LABETALOL HCL 5 MG/ML IV SOLN: 10.0000 mg | INTRAVENOUS | Status: DC | PRN

## 2022-02-03 MED ORDER — ONDANSETRON HCL 4 MG/2ML IJ SOLN: 4.0000 mg | Freq: Four times a day (QID) | INTRAMUSCULAR | Status: DC | PRN

## 2022-02-03 MED ORDER — SODIUM CHLORIDE 0.9 % IV SOLN: 250.0000 mL | INTRAVENOUS | Status: DC | PRN

## 2022-02-03 MED ORDER — HEPARIN (PORCINE) IN NACL 1000-0.9 UT/500ML-% IV SOLN
INTRAVENOUS | Status: DC | PRN
  Administered 2022-02-03: 500 mL

## 2022-02-03 MED ORDER — SODIUM CHLORIDE 0.9% FLUSH: 3.0000 mL | Freq: Two times a day (BID) | INTRAVENOUS | Status: DC

## 2022-02-03 MED ORDER — ACETAMINOPHEN 325 MG PO TABS: 650.0000 mg | ORAL_TABLET | ORAL | Status: DC | PRN

## 2022-02-03 MED ORDER — LIDOCAINE HCL (PF) 1 % IJ SOLN
INTRAMUSCULAR | Status: DC | PRN
  Administered 2022-02-03: 2 mL

## 2022-02-03 MED ORDER — HYDRALAZINE HCL 20 MG/ML IJ SOLN: 10.0000 mg | INTRAMUSCULAR | Status: DC | PRN

## 2022-02-03 MED ORDER — SODIUM CHLORIDE 0.9 % IV SOLN: INTRAVENOUS | Status: DC

## 2022-02-03 SURGICAL SUPPLY — 7 items
CATH SWAN GANZ 7F STRAIGHT (CATHETERS) IMPLANT
DRAPE BRACHIAL (DRAPES) IMPLANT
GLIDESHEATH SLENDER 7FR .021G (SHEATH) IMPLANT
PACK CARDIAC CATH (CUSTOM PROCEDURE TRAY) ×1 IMPLANT
PROTECTION STATION PRESSURIZED (MISCELLANEOUS) ×1
SET ATX SIMPLICITY (MISCELLANEOUS) IMPLANT
STATION PROTECTION PRESSURIZED (MISCELLANEOUS) IMPLANT

## 2022-02-03 NOTE — Discharge Instructions (Signed)
Right Heart Cath, Care After This sheet gives you information about how to care for yourself after your procedure. Your health care provider may also give you more specific instructions. If you have problems or questions, contact your health care provider. What can I expect after the procedure? After the procedure, it is common to have: Bruising or mild discomfort in the area where the IV was inserted (insertion site). Follow these instructions at home: Eating and drinking  You may eat and drink after your procedure.  Drink a lot of fluids for the first several days after the procedure, as directed by your health care provider. This helps to wash (flush) the contrast out of your body. Examples of healthy fluids include water or low-calorie drinks. General instructions Check your IV insertion area and also your venous access site every day for signs of infection. Check for: Redness, swelling, or pain. Fluid or blood. Warmth. Pus or a bad smell. Take over-the-counter and prescription medicines only as told by your health care provider. Rest and return to your normal activities as told by your health care provider. Ask your health care provider what activities are safe for you. Do not drive for 24 hours if you were given a medicine to help you relax (sedative), or until your health care provider approves. Keep all follow-up visits as told by your health care provider. This is important. Contact a health care provider if: Your skin becomes itchy or you develop a rash or hives. You have a fever that does not get better with medicine. You feel nauseous. You vomit. You have redness, swelling, or pain around the insertion site. You have fluid or blood coming from the insertion site. Your insertion area feels warm to the touch. You have pus or a bad smell coming from the insertion site. Get help right away if: You have difficulty breathing or shortness of breath. You develop chest pain. You  faint. You feel very dizzy. These symptoms may represent a serious problem that is an emergency. Do not wait to see if the symptoms will go away. Get medical help right away. Call your local emergency services (911 in the [place] Do not drive yourself to the hospital. Summary After your procedure, it is common to have bruising or mild discomfort in the area where the IV was inserted. You should check your IV insertion area every day for signs of infection. Take over-the-counter and prescription medicines only as told by your health care provider. You should drink a lot of fluids for the first several days after the procedure to help flush the contrast from your body. This information is not intended to replace advice given to you by your health care provider. Make sure you discuss any questions you have with your health care provider. Document Released: 12/28/2012 Document Revised: 02/19/2017 Document Reviewed: 02/01/2016 Elsevier Patient Education  2020 [employer].

## 2022-02-03 NOTE — Telephone Encounter (Signed)
Reviewed the patient's chart. He had his heart catheterization done today.  Closing this encounter.

## 2022-02-03 NOTE — Progress Notes (Signed)
Pt eating nabs and drinking Fam at bedside, both briefed by Dr End

## 2022-02-03 NOTE — Interval H&P Note (Signed)
History and Physical Interval Note:  02/03/2022 11:12 AM  Brian Casey  has presented today for surgery, with the diagnosis of shortness of breath and interstitial lung disease.  The various methods of treatment have been discussed with the patient and family. After consideration of risks, benefits and other options for treatment, the patient has consented to  Procedure(s): RIGHT HEART CATH (Right) as a surgical intervention.  The patient's history has been reviewed, patient examined, no change in status, stable for surgery.  I have reviewed the patient's chart and labs.  Questions were answered to the patient's satisfaction.     Treena Cosman

## 2022-02-18 ENCOUNTER — Encounter: Payer: Self-pay | Admitting: Internal Medicine

## 2022-02-18 DIAGNOSIS — J849 Interstitial pulmonary disease, unspecified: Secondary | ICD-10-CM

## 2022-02-18 NOTE — Telephone Encounter (Signed)
MR, please advise. thanks

## 2022-02-19 NOTE — Telephone Encounter (Signed)
He should take his Imodium Is he having any fever? Is he having abdominal pain? Is he having blood in the stools? Give video visit with nurse practitioner tomorrow and if the nurse practitioner has any questions they can contact me  But the diarrhea should be subsiding in the next day or 2 typically    Current Outpatient Medications:    albuterol (VENTOLIN HFA) 108 (90 Base) MCG/ACT inhaler, , Disp: , Rfl:    aspirin EC 81 MG tablet, Take 81 mg by mouth daily. Swallow whole., Disp: , Rfl:    atorvastatin (LIPITOR) 40 MG tablet, Take 40 mg by mouth daily., Disp: , Rfl:    budesonide (PULMICORT) 0.25 MG/2ML nebulizer solution, INHALE CONTENTS OF ONE VIAL/NEBULE VIA NEBULIZER TWO TIMES A DAY (Patient not taking: Reported on 11/05/2021), Disp: , Rfl:    carvedilol (COREG) 12.5 MG tablet, Take 12.5 mg by mouth 2 (two) times daily with a meal., Disp: , Rfl:    furosemide (LASIX) 20 MG tablet, Take 20 mg by mouth daily., Disp: , Rfl:    ipratropium-albuterol (DUONEB) 0.5-2.5 (3) MG/3ML SOLN, Take by nebulization. (Patient not taking: Reported on 01/29/2022), Disp: , Rfl:    levothyroxine (SYNTHROID) 100 MCG tablet, Take 100 mcg by mouth daily before breakfast., Disp: , Rfl:    Loperamide HCl (IMODIUM PO), Take 1 mg by mouth as needed (PT takes as needed)., Disp: , Rfl:    Nintedanib (OFEV) 150 MG CAPS, Take 1 capsule (150 mg total) by mouth 2 (two) times daily., Disp: 180 capsule, Rfl: 1   omeprazole (PRILOSEC) 20 MG capsule, Take 1 capsule (20 mg total) by mouth daily. OFFICE VISIT REQUIRED FOR ADDITIONAL REFILLS, Disp: 30 capsule, Rfl: 0   potassium chloride (KLOR-CON M) 10 MEQ tablet, Take 2 tabs BID x3 days, then 1 tab daily, Disp: , Rfl:    predniSONE (DELTASONE) 10 MG tablet, Take 1 tablet (10 mg total) by mouth daily with breakfast., Disp: 90 tablet, Rfl: 0   tadalafil (CIALIS) 5 MG tablet, Take 1 tablet by mouth as needed., Disp: , Rfl:

## 2022-02-19 NOTE — Telephone Encounter (Signed)
Called the pt and there was unfortunately no answer- Will try again tomorrow

## 2022-02-20 NOTE — Telephone Encounter (Signed)
Called and spoke with pt's spouse about the info from MR. Pt has not had any complaints of fever. Pt has been taking imodium to see if that would help with the diarrhea but he is still having a lot of diarrhea even with the imodium.  Pt is having to get up in the middle of the night due to having to go to the bathroom. Pt has no appetite and has even lost down to 180lbs.  Pt is very lethargic due to all that has been happening. Pt does not think that he has been having any blood in his stools.  Jenny Reichmann stated that pt did stop taking the OFEV Monday, 11/27. She stated that pt knows that the medication is to help with the progression of the disease but not sure if this is how he wants to have his qualify of life be due to the amount of diarrhea he is having. She stated that sometimes pt can have diarrhea 8 times a day and sometimes he could have diarrhea 18 times a day.  Pt has now been on OFEV for about 2 months now. No openings for an appt today. Pt does have an appt with MR 12/21. Tried to schedule an appt with an APP next week but Jenny Reichmann said that she wasn't sure what all could be done differently with what has been happening with pt so she just wanted to see what could be recommended.  Routing this to Tammy for her to review and also routing this to MR as an Pharmacist, hospital.

## 2022-02-20 NOTE — Telephone Encounter (Signed)
Diarrhea should decrease since he has been off of the Ofev if not he will need to be seen either by primary care or urgent care as it could be something else besides Ofev.  If he continues to have excessive diarrhea he will become dehydrated, he is also on Lasix which can increase his risk of dehydration as well. Make sure he is drinking plenty of fluids, electrolyte replacement. Highly recommend needs office visit for evaluation if he is continues to have excessive diarrhea  Please contact office for sooner follow up if symptoms do not improve or worsen or seek emergency care

## 2022-02-20 NOTE — Telephone Encounter (Signed)
Just 4:37 PM 02/20/2022 - called York Cerise on [phone] -> went to VM -> left message that if is bad to go to ER-> then 4:38 PM called [phone] -> he picked up the phone -> says last day ofev 02/16/22 -> no diarrhea today. It has stopped 02/19/22 as of yesterdya. Also lot of weight loss. Beginning to feel better. 18x diarrrha. Making urine. No fever. Has already gained 3# has appetite  He also said that in past low dose immuran better   RHC normal  Plan  - no more ofev -> [facility] - can give unopened bottles of ofev to our office as donor sample  - next week cbc, bmet, lft, - [facility] - pls oder

## 2022-02-23 ENCOUNTER — Other Ambulatory Visit
Admission: RE | Admit: 2022-02-23 | Discharge: 2022-02-23 | Disposition: A | Discharge status: Home / Self Care | Payer: No Typology Code available for payment source | Attending: Internal Medicine | Admitting: Internal Medicine

## 2022-02-23 DIAGNOSIS — J849 Interstitial pulmonary disease, unspecified: Secondary | ICD-10-CM | POA: Insufficient documentation

## 2022-02-23 LAB — HEPATIC FUNCTION PANEL
ALT: 25 U/L (ref 0–44)
AST: 29 U/L (ref 15–41)
Albumin: 3.7 g/dL (ref 3.5–5.0)
Alkaline Phosphatase: 60 U/L (ref 38–126)
Bilirubin, Direct: 0.3 mg/dL — ABNORMAL HIGH (ref 0.0–0.2)
Indirect Bilirubin: 1.1 mg/dL — ABNORMAL HIGH (ref 0.3–0.9)
Total Bilirubin: 1.4 mg/dL — ABNORMAL HIGH (ref 0.3–1.2)
Total Protein: 7 g/dL (ref 6.5–8.1)

## 2022-02-23 LAB — CBC WITH DIFFERENTIAL/PLATELET
Abs Immature Granulocytes: 0.04 10*3/uL (ref 0.00–0.07)
Basophils Absolute: 0 10*3/uL (ref 0.0–0.1)
Basophils Relative: 0 %
Eosinophils Absolute: 0 10*3/uL (ref 0.0–0.5)
Eosinophils Relative: 0 %
HCT: 39.1 % (ref 39.0–52.0)
Hemoglobin: 14 g/dL (ref 13.0–17.0)
Immature Granulocytes: 0 %
Lymphocytes Relative: 13 %
Lymphs Abs: 1.2 10*3/uL (ref 0.7–4.0)
MCH: 35.4 pg — ABNORMAL HIGH (ref 26.0–34.0)
MCHC: 35.8 g/dL (ref 30.0–36.0)
MCV: 98.7 fL (ref 80.0–100.0)
Monocytes Absolute: 0.5 10*3/uL (ref 0.1–1.0)
Monocytes Relative: 6 %
Neutro Abs: 7.8 10*3/uL — ABNORMAL HIGH (ref 1.7–7.7)
Neutrophils Relative %: 81 %
Platelets: 251 10*3/uL (ref 150–400)
RBC: 3.96 MIL/uL — ABNORMAL LOW (ref 4.22–5.81)
RDW: 12.4 % (ref 11.5–15.5)
WBC: 9.7 10*3/uL (ref 4.0–10.5)
nRBC: 0 % (ref 0.0–0.2)

## 2022-02-23 LAB — BASIC METABOLIC PANEL
Anion gap: 11 (ref 5–15)
BUN: 13 mg/dL (ref 8–23)
CO2: 30 mmol/L (ref 22–32)
Calcium: 8.6 mg/dL — ABNORMAL LOW (ref 8.9–10.3)
Chloride: 93 mmol/L — ABNORMAL LOW (ref 98–111)
Creatinine, Ser: 1.21 mg/dL (ref 0.61–1.24)
GFR, Estimated: >60 mL/min (ref >60)
Glucose, Bld: 179 mg/dL — ABNORMAL HIGH (ref 70–99)
Potassium: 3.7 mmol/L (ref 3.5–5.1)
Sodium: 134 mmol/L — ABNORMAL LOW (ref 135–145)

## 2022-02-23 NOTE — Telephone Encounter (Signed)
Routing to pharmacy team to make them aware of stopping the OFEV.

## 2022-02-23 NOTE — Telephone Encounter (Signed)
Noted. Unfortunately we are not able to provide verbal rx to [facility]. Will fax clinicals with note to d/c Ofev  Knox Saliva, PharmD, MPH, BCPS, CPP Clinical Pharmacist (Rheumatology and Pulmonology)

## 2022-02-24 ENCOUNTER — Telehealth: Payer: Self-pay | Admitting: Internal Medicine

## 2022-02-24 DIAGNOSIS — J849 Interstitial pulmonary disease, unspecified: Secondary | ICD-10-CM

## 2022-02-24 NOTE — Telephone Encounter (Signed)
  Bili up after being on ofev  but at time of test he was off ofev   Plan  - mark ofev allergy  - recheck LFT in 1 week - [facility]   Recent Labs  Lab 02/23/22 1748  AST 29  ALT 25  ALKPHOS 60  BILITOT 1.4*  PROT 7.0  ALBUMIN 3.7

## 2022-02-25 NOTE — Telephone Encounter (Signed)
Called and spoke with pt letting him know the results of labwork and he verbalized understanding. Pt said since stopping the OFEV, he has gotten his appetite back and also states that he has gained 8 pounds back. Pt will go for repeat labwork in 1 week. Nothing further needed.

## 2022-03-03 ENCOUNTER — Other Ambulatory Visit
Admission: RE | Admit: 2022-03-03 | Discharge: 2022-03-03 | Disposition: A | Discharge status: Home / Self Care | Payer: No Typology Code available for payment source | Attending: Internal Medicine | Admitting: Internal Medicine

## 2022-03-03 DIAGNOSIS — J849 Interstitial pulmonary disease, unspecified: Secondary | ICD-10-CM | POA: Insufficient documentation

## 2022-03-03 LAB — HEPATIC FUNCTION PANEL
ALT: 29 U/L (ref 0–44)
AST: 34 U/L (ref 15–41)
Albumin: 3.6 g/dL (ref 3.5–5.0)
Alkaline Phosphatase: 60 U/L (ref 38–126)
Bilirubin, Direct: 0.2 mg/dL (ref 0.0–0.2)
Indirect Bilirubin: 1 mg/dL — ABNORMAL HIGH (ref 0.3–0.9)
Total Bilirubin: 1.2 mg/dL (ref 0.3–1.2)
Total Protein: 6.8 g/dL (ref 6.5–8.1)

## 2022-03-03 NOTE — Addendum Note (Signed)
Addended by: Antonieta Iba C on: 03/03/2022 11:36 AM   Modules accepted: Orders

## 2022-03-12 ENCOUNTER — Other Ambulatory Visit: Payer: Self-pay

## 2022-03-12 ENCOUNTER — Other Ambulatory Visit: Payer: Self-pay | Admitting: Internal Medicine

## 2022-03-12 ENCOUNTER — Encounter: Payer: Self-pay | Admitting: Internal Medicine

## 2022-03-12 ENCOUNTER — Telehealth (INDEPENDENT_AMBULATORY_CARE_PROVIDER_SITE_OTHER): Payer: No Typology Code available for payment source | Admitting: Internal Medicine

## 2022-03-12 DIAGNOSIS — J9611 Chronic respiratory failure with hypoxia: Secondary | ICD-10-CM

## 2022-03-12 DIAGNOSIS — J679 Hypersensitivity pneumonitis due to unspecified organic dust: Secondary | ICD-10-CM | POA: Diagnosis not present

## 2022-03-12 DIAGNOSIS — Z6830 Body mass index (BMI) 30.0-30.9, adult: Secondary | ICD-10-CM

## 2022-03-12 DIAGNOSIS — Z8709 Personal history of other diseases of the respiratory system: Secondary | ICD-10-CM

## 2022-03-12 DIAGNOSIS — Z7952 Long term (current) use of systemic steroids: Secondary | ICD-10-CM

## 2022-03-12 DIAGNOSIS — E669 Obesity, unspecified: Secondary | ICD-10-CM

## 2022-03-12 MED ORDER — PREDNISONE 10 MG PO TABS: ORAL_TABLET | ORAL | 0 refills | Status: AC

## 2022-03-12 NOTE — Progress Notes (Addendum)
OV 11/04/2021  Subjective:  Patient ID: Brian Casey, male , DOB: [birth year] , age 71 y.o. , MRN: [id number] , ADDRESS: [address] [place] [address] PCP [name], MD Patient Care Team: [name], MD as PCP - General (Family Medicine) Minna Merritts, MD as Consulting Physician (Cardiology) Dahlia Byes, MD as Consulting Physician (Cardiothoracic Surgery)  This Provider for this visit: Treatment Team:  Attending Provider: Brand Males, MD    11/04/2021 -  hx chart reviwee,patient, wife, ILD questions Chief Complaint  Patient presents with   Consult    Pt has had recent imaging performed which showed diagnosis of ILD. Pt has had complaints of cough and SOB.     HPI Brian Casey 71 y.o. -  Presents with wife. Has hx of heavy asbestos ezposure. Former smoker. In Aug 2017 had acute chole, e colii bcacteremia and NSTEMI and sp CABG. CTA at that time showed some GGO and bialteral efffusion. Then in 2019 June admitted for had pleuirsy and s/p thora 1L (rad review confirms that). LHD 579. CUtlure negatiuve. WAS EOSINOPHLIC 27%. C at that the time considered as ILD by chest radiology. Also had AKI creat 1.35m%. Then started following with Dr FRaul Delin [place] Says he was on surveillance approach. No thora ever after that. In April 2022 he had CT - the ILD for first time started getting worse but he was feeling fine. In June 2022 started seeing Dr ALanney Gins ARound this time also had Covid. In Nov 2022 had another CT cchest  ILD progressed but stil feeling ok. In Jan 2023 developed cough, ACE inhibitor got changed to ARB and sometime later got started in pred/immuran as well for his ILD. Then developed flu like symptoms. Both ARB and immuran g t stopped and he got better. Immuran restarted at lower dose at 547mper day in May 2023. CT at this time showed worsening ILD. 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 Prednisone at 2029mer day. No more flu symptoms.  Feels side effect was ARB related. Despte immune suppression regimen he feels he is declining more dysneic. Still on room air at home and started rehab few days ago. Noticed in rehab needing 4L [place] to exert  (also confirmed here this visit). In addition prednisone is causing insomina and fatigue. He feels this med regimen is not helping  Of note he has upcoing endoscopy and colonoscop with Dr TolAlice Reichert30/23. Also has a verntral hernia for which he wants surgery. Overall he and his wife are very concerned about this ILD   He is also on Inhaler Rx and not sure it is helOrthoptisttegrated Comprehensive ILD Questionnaire  Symptoms:  gradally getting worse x 6 months. Cough x 6 months only but CT actually getting worse Apr 2022 -> May 2023. 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 Worked for [employer] in [place] (27 items):  Prednisone and Immuran since May 2023  INVESTIGATIONS: 2017 CT abdomen lung cuts  He had left-sided pleural effusion and reticular abnormalities suggestive of ILD [back then it was  called as bibasilar atelectasis]  2017 August CT scan of the chest: Bilateral pleural effusions some upper lobe groundglass opacities.  No clear-cut evidence of ILD:  2019 June CT scan of the chest: Read by Dr. Polly Cobia.  Concerning for ILD.  Reticulation also left-sided pleural effusion  May 2021 high-resolution CT chest: Read by Dr. Polly Cobia.  Indeterminate pattern for UIP versus alternate diagnosis.Marland Kitchen  Definite of ILD.  There is subpleural sparing.  Left-sided pleural effusion present.  Findings deemed is unchanged from prior.  I personally agree with the findings  April 2022 CT chest without contrast [not a high-resolution CT chest].  Personally thought ILD is worse.  Radiology seems to agree  November 2022 in May 2023 CT scan of the chest [again not high-resolution] definite worsening of ILD changes.  Increased groundglass component.  May 2023: CT - progressive ILD. Worsning GGO and reticulation. Trace left effuion   UGI score 11/19/21:: Diffuse mild inflammation characterized by congestion (edema) and erosions was found in the entire examined stomach.   OV 11/20/2021  Subjective:  Patient ID: Brian Casey, male , DOB: April 20, 1950 , age 71 y.o. , MRN: [id number] , ADDRESS: [place] [address] PCP [name], MD Patient Care Team: [name], MD as PCP - General (Family Medicine) Minna Merritts, MD as Consulting Physician (Cardiology) Dahlia Byes, MD as Consulting Physician (Cardiothoracic Surgery)  This Provider for this visit: Treatment Team:  Attending Provider: Brand Males, MD   11/20/2021 -returns for video visit.  ILD work-up in progress.   HPI Brian Casey 71 y.o. -  #ILD work-up: After his last visit I presented him at the thoracic multidisciplinary conference.  Dr. Burt Ek felt the CT scan was more consistent with alternative pattern possibly fibrotic NSIP or even HP given the upper lobe predominance and air trapping.  He  subsequently had a high-resolution CT scan of the chest that was read by Dr. Vinnie Langton who feels CT scan is more consistent with chronic HP.  We went over the exposures again there is no current exposure.  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  While he remembers asbestos exposure he does not remember mold exposure but it was definitely water and there was a lot of dampness.  There is no evidence of asbestos plaques or UIP pattern on the CT scan of the chest.  Nevertheless it is progressive.  We discussed the differential diagnosis currently of chronic HP versus NSIP versus asbestosis versus IPF.  It appears that most likely this is chronic HP.  Based on this and the progression my recommendation to him was that we would start antifibrotic's.  Did indicate to him that there is a role for prednisone and immunomodulators in the setting of chronic NSIP or HP but given the side effect profile with prednisone [fatigue and insomnia] and also future potential risk for  immunosuppression with Imuran I advised that antifibrotic's nintedanib would be first-line.  He is better now with 10 mg prednisone.  He is sleeping better.  Therefore also recommended continuing with prednisone.  Recommended holding off any restarting of the Imuran or consideration of CellCept as a third line option.  We will also going to hold off on surgical lung biopsy.  Indicated to him that in the long run he would need to lose weight get fit and consider transplant as an option.  For these purposes I would like to avoid immunosuppression and also surgical lung biopsy.  In addition with 4 L exertion desaturation did not want to consider surgical lung biopsy at this point in time.  He and his wife are agreeable with the plan  #Therapeutic side effects: After stopping prednisone his insomnia and fatigue are better.  We discussed  nintedanib.  He does have diverticulosis on the recent CT scan but has never had diverticulitis.  He does not have a history of GI bleeding.  No history of MI.  Recent echo was normal.  We discussed the diarrhea and liver toxicity monitoring of the rare side effects of nintedanib.  He is agreeable to start this.  #Hypoxemia: He told me that he is desaturating at home with 4 L but does well in rehab because of his machine.  I recommended oxygen titration test but my CMA indicated to me that he did fine with 4 L exertion in our office at last visit.  She will him to contact him and verify if he is still desaturating at home.  If so then we will have to do a detailed oxygen titration test.  He is still awaiting his Joselyn Arrow through the [place].  He does not want to do it through [facility] and it could take a while.  #Umbilical hernia repair.  This is being held off at this point.       HRCT 11/13/21  Narrative & Impression  CLINICAL DATA:  71 year old male with history of cough and shortness of breath worsening since May 2023. Evaluate for interstitial lung disease.   EXAM: CT CHEST WITHOUT CONTRAST   TECHNIQUE: Multidetector CT imaging of the chest was performed following the standard protocol without intravenous contrast. High resolution imaging of the lungs, as well as inspiratory and expiratory imaging, was performed.   RADIATION DOSE REDUCTION: This exam was performed according to the departmental dose-optimization program which includes automated exposure control, adjustment of the mA and/or kV according to patient size and/or use of iterative reconstruction technique.   COMPARISON:  Chest CT 08/20/2021.   FINDINGS: Cardiovascular: Heart size is normal. There is no significant pericardial fluid, thickening or pericardial calcification. There is aortic atherosclerosis, as well as atherosclerosis of the great vessels of the mediastinum and the coronary arteries,  including calcified atherosclerotic plaque in the left main, left anterior descending, left circumflex and right coronary arteries. Status post median sternotomy for CABG including LIMA to the LAD.   Mediastinum/Nodes: No pathologically enlarged mediastinal or hilar lymph nodes. Please note that accurate exclusion of hilar adenopathy is limited on noncontrast CT scans. Esophagus is unremarkable in appearance. No axillary lymphadenopathy.   Lungs/Pleura: Widespread but patchy areas of ground-glass attenuation, septal thickening, subpleural reticulation, thickening of the peribronchovascular interstitium, cylindrical bronchiectasis and peripheral bronchiolectasis are noted. Some areas of apparent developing honeycombing are noted, most evident near the apex of the right upper lobe. Overall, findings appear relatively similar to the recent prior chest CT. Inspiratory and  expiratory imaging demonstrates some mild air trapping indicative of mild small airways disease. No definite suspicious appearing pulmonary nodules or masses are noted. No pleural effusions.   Upper Abdomen: Aortic atherosclerosis.  Status post cholecystectomy.   Musculoskeletal: Median sternotomy wires. There are no aggressive appearing lytic or blastic lesions noted in the visualized portions of the skeleton.   IMPRESSION: 1. The appearance of the lungs is indicative of interstitial lung disease, with minimal progression compared to the most recent prior study, but definitive progression when compared to more remote prior examinations. 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 2. Aortic atherosclerosis, in addition to left main and three-vessel coronary artery disease. Status post median sternotomy for CABG including LIMA to the LAD.   Aortic Atherosclerosis  (ICD10-I70.0).     Electronically Signed   By: Vinnie Langton M.D.   On: 11/15/2021 06:28    ECHO 11/12/21  IMPRESSIONS     1. Left ventricular ejection fraction, by estimation, is 60 to 65%. The  left ventricle has normal function. The left ventricle has no regional  wall motion abnormalities. There is mild left ventricular hypertrophy.  Left ventricular diastolic parameters  are consistent with Grade I diastolic dysfunction (impaired relaxation).   2. Right ventricular systolic function is normal. The right ventricular  size is normal.   3. The mitral valve is normal in structure. No evidence of mitral valve  regurgitation.   4. The aortic valve is tricuspid. Aortic valve regurgitation is not  visualized. Aortic valve sclerosis is present, with no evidence of aortic  valve stenosis.   5. Aortic dilatation noted. There is mild dilatation of the aortic root,  measuring 39 mm.   6. The inferior vena cava is normal in size with greater than 50%  respiratory variability, suggesting right atrial pressure of 3 mmHg.    Aug 2023 ILD confi with Dr Burt Ek  In 2019 early ILD with reticuation. SIgnifcant progressiont hrough latest CT in May 2023. Lot of upper lobe > Lower lobe back then and now. OVerall pattern: ALTERNATIVE CATEGRORy. DDx is FIBROTIC HP (2021 Exp phase imaging was inadequate , 2020 there was mild air trappng). In May 2023 - more GGO  ? flare. Overall patch peribronchosvascular. NOT c/w UIP. Has pleural thicekning on left but prob due to cardiac urgery. No calcifiied pleural plaques  Consider bix if Risk ok   OV 01/13/2022  Subjective:  Patient ID: Brian Casey, male , DOB: 11-11-1950 , age 73 y.o. , MRN: [id number] , ADDRESS: [address] [place] [address] PCP [name], MD Patient Care Team: [name], MD as PCP - General (Family Medicine) Minna Merritts, MD as Consulting Physician (Cardiology) Dahlia Byes, MD as Consulting  Physician (Cardiothoracic Surgery)  This Provider for this visit: Treatment Team:  Attending Provider: Brand Males, MD    01/13/2022 -   Chief Complaint  Patient presents with   Follow-up    Follow-up PT states breathing is not better, DOE   ILD - workup complete. 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 -  prsents with wife for followup. He and wife are quire worried about his health and life expectancy (few to several years) and quality of life He has sveral concrns  Easy destruation- sometimes to 60% and 75%. Always with eerrtion and relieved by rest but there is variability. He is unsure of quality of pulse ox probe but appears same thing inr ehab based on 5md test chart review. STill here able to walk ADL distances ith 6L and without desats Echo shows Gr 1 DDx - has seen cards Saw Dr GRockey Situ- has not had RLenwoodFeels rehab did not help him though 677m distance improved fom 400+ feet to 700 + feet ILD Symptoms: feels getting worse. Needing more o2 Not yet started night o2: ONO results are finally in but with PCP HeMaryland PinkMD [facility]  On ofev but having severe diarrhea. Takes immodium in reactionary fashion every other day. Drinks coffee daily.  Drinks "no sugar added" apple juice Rx - continue prednisone 1013mer day Pointd out he worked in [place] [employer] - explained could be few to several years but with small % declining rapidly in < 1 year.  In his case PFTs show sever restriction and dLCO Also unintenitional weight loss due to ofev - but he sees this as positive due to obsity Has new low K 3 meq 12/31/21 and was wondering if due to prednisone  (unlikely) or diarrhea (possibly yes) Abd hernia - surgery not in the works Obesity - BMI is > 30 and is a barrier to health  =     OV 03/12/2022  Subjective:  Patient ID: Brian Casey , DOB: [birth year]age 44 31o. , MRN: [id number]: [address] HedMaryland PinkD Patient Care Team: HedMaryland PinkD as PCP - General (Family Medicine) GolMinna MerrittsD as PCP - Cardiology (Cardiology) VanDahlia ByesD as Consulting Physician (Cardiothoracic Surgery)   Type of visit: Video Virtual Visit Identification of patient Brian NESMITHth 1/21954-01-03d MRN [id number] person identifier Risks: Risks, benefits, limitations of telephone visit explained. Patient understood and verbalized agreement to proceed Anyone else on call: wife Patient location: home This provider location: [id number]; [place] [id number]. [phone]    This Provider for this visit: Treatment Team:  Attending Provider: RamBrand MalesD  Chronic HP  - failed ofev -  GI and weight loss  - prior on immuran Chronic Resp failure 4L [place] Obesity  - goal weight 170-175#  03/12/2022 -  No chief complaint on file.    HPI LarKOLLEN Casey 77o. -stopped ofev 02/16/22. Recent increase in cough. PCP office was positive for flu 03/05/22. Since then cough is worse. Rx with Tamiflu but has not helped. Using hydrocodone more now but still has cough at night. Slowly improving though.  On 02/23/22 his bili was 1.4 (After stopping ofev) but rechck 03/03/22 bilis is 1.2. Currently 4L [place] day and at night 2L [place] ([facility] Wife says even with exertion at 4L [place] drops to 77% . Unable to do "Anything" without desats. Currently remains on 28m35mednisone. HE has challenge going up on prednisone diue to insominia. But now PCP has him on xanax and is helping sleep . So he feels he can challenge himself  with higher dose prednisone   ILD Rx:   -  Discussed transplant: wife dealing with aneurysm thoracoabdominal and is awaitig surgery in [facility] with a device in [place].  They are busy with all this. Surgery is in Q2 2024 and is taking a lot of her time.  This can prove a challenge. Wife is only 1 caregiver. There are kids but they are working. Living in [place]. But after discussion willing to see them.   - antifibrotic : did not tolerate ofev.  We discussed esbriet. Explained side effects. Explained dosing. Will have to [facility]. He Is willing. BRiefly discussed immuran and cellcept as optoons  - exertional hypoxemia: advised to increase o2 with exertion. In Oct we needed to increase to [facility]. Home concerntrator does not go to 6l [place] He wants order to go [facility] HE says our [facility] knows the [place] contact  Right heart cath 02/03/2022- PAP 67mHg. -> does not meet criteria for tyvaso   SYMPTOM SCALE - ILD 11/04/2021 01/13/2022 ofev 03/12/2022 Off ofev  Current weight  196# 181.8# - eeliugn around same   635m 420 feet in July 745 feet afte rehab In Oct 2023   O2 use RA but needing 4L [place] with exertin 4L rest and exertion. Sometimes 6L   Shortness of Breath 0 -> 5 scale with 5 being worst (score 6 If unable to do)    At rest 0 0   Simple tasks - showers, clothes change, eating, shaving 4 4   Household (dishes, doing bed, laundry) 4 4   Shopping 3 4   Walking level at own pace 3 4   Walking up Stairs 5 5   Total (30-36) Dyspnea Score 19 21     Non-dyspnea symptoms (0-> 5 scale) 11/04/2021 01/13/2022   How bad is your cough? 2.5 0  How bad is your fatigue 3 3  How bad is nausea 0 2  How bad is vomiting?  0 0  How bad is diarrhea? 0 5+  How bad is anxiety? 3 4  How bad is depression 2 3  Any chronic pain - if so where and how bad 0 x    Simple office walk 185 feet x  3 laps goal with forehead probe 01/13/2022  Needs walk test  O2 used 4   Number laps completed Aim  ws 3 and start of 2nd lap increased o2   Comments about pace avg   Resting Pulse Ox/HR 100% and 66/min   Final Pulse Ox/HR 86% and 98/min   Desaturated </= 88% Yes at start of 2n lap   Desaturated <= 3% points yes   Got Tachycardic >/= 90/min yes   Symptoms at end of test Mod doe   Miscellaneous comments 6L [place] finished all 3 laps wth correction      PFT     Latest Ref Rng & Units 12/12/2021   10:02 AM 11/11/2015    7:38 AM  PFT Results  FVC-Pre L 1.41  1.20   FVC-Predicted Pre % 35  28   FVC-Post L 1.46  1.35   FVC-Predicted Post % 36  32   Pre FEV1/FVC % % 89  60   Post FEV1/FCV % % 89  56   FEV1-Pre L 1.26  0.72   FEV1-Predicted Pre % 43  22   FEV1-Post L 1.30  0.76   DLCO uncorrected ml/min/mmHg 5.38  8.93   DLCO UNC% % 22  31   DLCO corrected ml/min/mmHg 5.38  10.84   DLCO COR %  Predicted % 22  38   DLVA Predicted % 59  119   TLC L 2.23  2.47   TLC % Predicted % 34  38   RV % Predicted % 33  61        has a past medical history of Adenomatous colon polyp, Anxiety, Aortic root dilatation (HCC), Arthritis, CAD (coronary artery disease), CKD (chronic kidney disease), stage III ([place]), Depression, Diastolic dysfunction, Diverticulosis, GERD (gastroesophageal reflux disease), High cholesterol, History of bronchitis, History of Hyperthyroidism, Hypertension (10/17/2013), Hypothyroidism, ILD (interstitial lung disease) ([employer]), PAD (peripheral artery disease) ([place]), Post-op atrial fibrillation ([place]) (11/06/2015), Shortness of breath, Type II diabetes mellitus ([place]), and Wears glasses.   reports that he quit smoking about 6 years ago. His smoking use included cigarettes. He has a 48.00 pack-year smoking history. He has never used smokeless tobacco.  Past Surgical History:  Procedure Laterality Date   CARDIAC CATHETERIZATION Right 11/06/2015   Procedure: Left Heart Cath and Coronary Angiography;  Surgeon: Minna Merritts, MD;  Location: [place] [facility];  Service:  Cardiovascular;  Laterality: Right;   CHOLECYSTECTOMY N/A 01/17/2016   Procedure: LAPAROSCOPIC CHOLECYSTECTOMY WITH INTRAOPERATIVE CHOLANGIOGRAM;  Surgeon: Fanny Skates, MD;  Location: [place];  Service: General;  Laterality: N/A;   COLONOSCOPY     COLONOSCOPY N/A 11/19/2021   Procedure: COLONOSCOPY;  Surgeon: Toledo, Benay Pike, MD;  Location: [facility];  Service: Gastroenterology;  Laterality: N/A;   CORONARY ARTERY BYPASS GRAFT N/A 11/14/2015   Procedure: CORONARY ARTERY BYPASS GRAFTING (CABG) x 4;  Surgeon: Ivin Poot, MD;  Location: [place];  Service: Open Heart Surgery;  Laterality: N/A;   ENDARTERECTOMY Left 11/14/2015   Procedure: LEFT ENDARTERECTOMY CAROTID;  Surgeon: Waynetta Sandy, MD;  Location: [place];  Service: Vascular;  Laterality: Left;   ESOPHAGOGASTRODUODENOSCOPY N/A 11/19/2021   Procedure: ESOPHAGOGASTRODUODENOSCOPY (EGD);  Surgeon: Toledo, Benay Pike, MD;  Location: [facility];  Service: Gastroenterology;  Laterality: N/A;   FINGER GANGLION CYST EXCISION Left X 3   "index finger X 2; thumb X 1"   [place] OF MESH N/A 11/16/2013   Procedure: INSERTION OF MESH;  Surgeon: Imogene Burn. Georgette Dover, MD;  Location: [place];  Service: General;  Laterality: N/A;   IR GENERIC HISTORICAL  11/08/2015   IR PERC CHOLECYSTOSTOMY 11/08/2015 Corrie Mckusick, DO [facility]   IR GENERIC HISTORICAL  12/13/2015   IR CHOLANGIOGRAM EXISTING TUBE 12/13/2015 [facility]   IR GENERIC HISTORICAL  12/25/2015   IR CATHETER TUBE CHANGE 12/25/2015 Sandi Mariscal, MD [facility]   IR GENERIC HISTORICAL  12/12/2015   IR RADIOLOGIST EVAL & MGMT 12/12/2015 Greggory Keen, MD [facility]   RIGHT HEART CATH Right 02/03/2022   Procedure: RIGHT HEART CATH;  Surgeon: Nelva Bush, MD;  Location: [place] [facility];  Service: Cardiovascular;  Laterality: Right;   TEE WITHOUT CARDIOVERSION N/A 11/14/2015   Procedure: TRANSESOPHAGEAL ECHOCARDIOGRAM (TEE);   Surgeon: Ivin Poot, MD;  Location: [place];  Service: Open Heart Surgery;  Laterality: N/A;   UMBILICAL HERNIA REPAIR N/A 11/16/2013   Procedure: UMBILICAL HERNIA REPAIR;  Surgeon: Imogene Burn. Georgette Dover, MD;  Location: [place];  Service: General;  Laterality: N/A;   VEIN HARVEST Right 11/14/2015   Procedure: RIGHT LEG GREATER SAPHENOUS VEIN HARVEST;  Surgeon: Ivin Poot, MD;  Location: [place];  Service: Open Heart Surgery;  Laterality: Right;    Allergies  Allergen Reactions  Penicillins Nausea And Vomiting and Other (See Comments)    REACTION: Vomiting Has patient had a PCN reaction causing immediate rash, facial/tongue/throat swelling, SOB or lightheadedness with hypotension: Yes Has patient had a PCN reaction causing severe rash involving mucus membranes or skin necrosis: No Has patient had a PCN reaction that required hospitalization No Has patient had a PCN reaction occurring within the last 10 years: No If all of the above answers are "NO", then may proceed with Cephalosporin use.   Amlodipine Swelling   Bupropion    Bupropion Hcl     REACTION: Severe constipation, insomnia   Chlorthalidone Other (See Comments)    Other reaction(s): Hyponatremia   Metformin Diarrhea   Ofev [Nintedanib] Diarrhea   Terazosin Other (See Comments)    Immunization History  Administered Date(s) Administered   Fluad Quad(high Dose 65+) 01/13/2022   Influenza Inj Mdck Quad Pf 12/25/2015   Influenza Split 01/26/2011, 02/10/2012   Influenza Whole 01/22/2004, 02/05/2008, 12/26/2008   Influenza,inj,Quad PF,6+ Mos 12/29/2012, 01/10/2014, 12/11/2014   Influenza-Unspecified 12/25/2016, 12/22/2017, 12/21/2018, 12/07/2019, 12/24/2020   PFIZER Comirnaty(Gray Top)Covid-19 Tri-Sucrose Vaccine 04/09/2019, 05/02/2019, 12/28/2019, 01/01/2021   Pneumococcal Conjugate-13 03/29/2014, 11/06/2014, 11/15/2017   Pneumococcal Polysaccharide-23 11/05/2016   Td 08/22/1998, 08/18/2013   Zoster, Live 03/29/2014     Family History  Problem Relation Age of Onset   Hypertension Mother    Diabetes Mother    Depression Mother    Colon cancer Mother    Hyperthyroidism Father    Hypertension Father    Prostate cancer Father    Diabetes Maternal Grandmother    Lung cancer Maternal Grandmother        non smoker     Current Outpatient Medications:    albuterol (VENTOLIN HFA) 108 (90 Base) MCG/ACT inhaler, , Disp: , Rfl:    aspirin EC 81 MG tablet, Take 81 mg by mouth daily. Swallow whole., Disp: , Rfl:    atorvastatin (LIPITOR) 40 MG tablet, Take 40 mg by mouth daily., Disp: , Rfl:    budesonide (PULMICORT) 0.25 MG/2ML nebulizer solution, INHALE CONTENTS OF ONE VIAL/NEBULE VIA NEBULIZER TWO TIMES A DAY (Patient not taking: Reported on 11/05/2021), Disp: , Rfl:    carvedilol (COREG) 12.5 MG tablet, Take 12.5 mg by mouth 2 (two) times daily with a meal., Disp: , Rfl:    furosemide (LASIX) 20 MG tablet, Take 20 mg by mouth daily., Disp: , Rfl:    ipratropium-albuterol (DUONEB) 0.5-2.5 (3) MG/3ML SOLN, Take by nebulization. (Patient not taking: Reported on 01/29/2022), Disp: , Rfl:    levothyroxine (SYNTHROID) 100 MCG tablet, Take 100 mcg by mouth daily before breakfast., Disp: , Rfl:    Loperamide HCl (IMODIUM PO), Take 1 mg by mouth as needed (PT takes as needed)., Disp: , Rfl:    omeprazole (PRILOSEC) 20 MG capsule, Take 1 capsule (20 mg total) by mouth daily. OFFICE VISIT REQUIRED FOR ADDITIONAL REFILLS, Disp: 30 capsule, Rfl: 0   potassium chloride (KLOR-CON M) 10 MEQ tablet, Take 2 tabs BID x3 days, then 1 tab daily, Disp: , Rfl:    predniSONE (DELTASONE) 10 MG tablet, Take 1 tablet (10 mg total) by mouth daily with breakfast., Disp: 90 tablet, Rfl: 0   tadalafil (CIALIS) 5 MG tablet, Take 1 tablet by mouth as needed., Disp: , Rfl:       Objective:   There were no vitals filed for this visit.  Estimated body mass index is 30.23 kg/m as calculated from the following:   Height as of  02/03/22: 5'  7" (1.702 m).   Weight as of 02/03/22: 193 lb (87.5 kg).  _0 @  There were no vitals filed for this visit.   Physical Exam General: No distress. Ha slost weight Neuro: Alert and Oriented x 3. GCS 15. Speech normal Psych: Pleasant         Assessment:       ICD-10-CM   1. Chronic respiratory failure with hypoxia (HCC)  J96.11     2. 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 Current chronic use of systemic steroids  Z79.52     4. History of influenza  Z87.09     5. Class 1 obesity with serious comorbidity and body mass index (BMI) of 30.0 to 30.9 in adult, unspecified obesity type  E66.9    Z68.30          Plan:      Patient Instructions     ICD-10-CM   1. Chronic respiratory failure with hypoxia (HCC)  J96.11     2. 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 Current chronic use of systemic steroids  Z79.52     4. History of influenza  Z87.09     5. 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 #Chronic prednisonuese #OFev intolerance  - stable but having significant desaturation as abefore -Tolerating 10 mg of prednisone daily -Liver function test normal dec 2023 - Right Heart cath normal   PLAN - no role for tyvaso - No more ofev  - list as allergy - STart esbriet per protocol  - [facility] -at some pointwe will talk about reintroducing Imuran or starting CellCept to control your fibrosis -[facility]  -Continue 4 L -6L N nasal cannula with exertion; goal pulse ox  greater than 86%  [facility] that can go upto 10L [place] with long tubing  - spirometry and dlco in [facility] clinic in 6 weeks - simple walk test in [facility] clinic in 6 weeks   #Obesity with a BMI greater than 30 and hyperglycemia  - Glad there is some weight loss due to ofev -You definitely need to lose weight to a BMI less than 27; approximately 170-175 pounds to qualify for lung transplant  Plan  - Talk to primary care doctor about new weight loss drugs [caution GI side effects] -Talk to primary care doctor about low carbohydrate diet -Avoid all sugars including fruit juices    Follow-up - 6 weeks with APP Tammy in B[facility] clinic but after PFT to see how esbriet is being tolreated - Return to see Dr. RamChase Callervideo visit ok)( 30-minute visit in less than 12 weeks   (Level 04: Estb 30-39 min   visit type: video virtual visit visit spent in total care time and counseling or/and coordination of care by this undersigned MD - Dr MurBrand Maleshis includes one or more of the following on this same day 03/12/2022: pre-charting, chart review, note writing, documentation discussion  of test results, diagnostic or treatment recommendations, prognosis, risks and benefits of management options, instructions, education, compliance or risk-factor reduction. It excludes time spent by the [place] or office staff in the care of the patient . Actual time is 30 min)   SIGNATURE    Dr. Brand Males, M.D., F.C.C.P,  Pulmonary and Critical Care Medicine Staff Physician, [place] Director - Interstitial Lung Disease  Program  Pulmonary [place] at [place], [place], [address]  Pager: [phone], If no answer or between  15:00h - 7:00h: call [phone] Telephone: [phone]  9:09 AM 03/12/2022

## 2022-03-12 NOTE — Addendum Note (Signed)
Addended by: Alvin Critchley on: 03/12/2022 10:52 AM   Modules accepted: Orders

## 2022-03-12 NOTE — Patient Instructions (Addendum)
ICD-10-CM   1. Chronic respiratory failure with hypoxia (HCC)  J96.11     2. 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 Current chronic use of systemic steroids  Z79.52     4. History of influenza  Z87.09     5. 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 #Chronic prednisonuese #OFev intolerance  - stable but having significant desaturation as abefore -Tolerating 10 mg of prednisone daily -Liver function test normal dec 2023 - Right Heart cath normal   PLAN - no role for tyvaso - No more ofev  - list as allergy - STart esbriet per protocol  - [facility]at some pointwe will talk about reintroducing Imuran or starting CellCept to control your fibrosis -[facility]  -Continue 4 L -6L N nasal cannula with exertion; goal pulse ox greater than 86%  -- [facility] that can go upto 10L [place] with long tubing  - spirometry and dlco in [facility] clinic in 6 weeks - simple walk test in [facility] clinic in 6 weeks   #Obesity with a BMI greater than 30 and hyperglycemia  - Glad there is some weight loss due to ofev -You definitely need to lose weight to a BMI less than 27; approximately 170-175 pounds to qualify for lung transplant  Plan  - Talk to primary care doctor about new weight loss drugs [caution GI side effects] -Talk to primary care doctor about low carbohydrate diet -Avoid all sugars including fruit juices    Follow-up - 6 weeks with APP Tammy in [facility] clinic but after PFT to see how  esbriet is being tolreated - Return to see Dr. RamChase Callervideo visit ok)( 30-minute visit in less than 12 weeks

## 2022-04-08 ENCOUNTER — Other Ambulatory Visit: Payer: Self-pay

## 2022-04-08 ENCOUNTER — Ambulatory Visit: Payer: Self-pay | Admitting: Surgery

## 2022-04-08 ENCOUNTER — Telehealth: Payer: Self-pay | Admitting: Internal Medicine

## 2022-04-08 DIAGNOSIS — J849 Interstitial pulmonary disease, unspecified: Secondary | ICD-10-CM

## 2022-04-08 DIAGNOSIS — R0609 Other forms of dyspnea: Secondary | ICD-10-CM

## 2022-04-08 NOTE — Telephone Encounter (Signed)
Can try short course prednisone burst again   - Please take Take prednisone 31m once daily x 3 days, then 391monce daily x 3 days, then 2058mnce daily x 3 days, then prednisone 58m49mce daily  to contnue  - regardng esbriet - has started day1 as yet -> the instructions should be there     Instructions   - Slowly increase the dose per protocol   - 1 pil (267mg17mree times daily with food x 1 week -> 2 pills three times daily with food x 1 week -> 3 pills three times daily to contnue   -Always take it with food  -Any nausea you can try ginger capsules  -Give at least between 5 and 6 hours between dosing  -Good idea to participate in [facility] medication support program - this is optional  -Definitely apply sunscreen when you go out with this medication  - You will need monthly liver tests for 6 months and thereafter every 3 months

## 2022-04-08 NOTE — H&P (Signed)
Subjective    Chief Complaint: Follow-up ( NEW PROBLEM - umbilical pain/hernia, )   PCP - Dr. [name] Pulm - Dr. Chase Caller Cardiology - Dr. Rockey Situ   History of Present Illness: Brian Casey is a 72 y.o. male who is seen today as an office consultation at the request of Dr. Kary Kos for evaluation of Follow-up ( NEW PROBLEM - umbilical pain/hernia, ) .     This is a 72 year old male who underwent open umbilical hernia repair with a small Ventralex mesh on 11/16/2013.  The fascial defect was closed anterior to the mesh.  In 2017, he was admitted to the hospital with acute cholecystitis but no sign of gallstones.  He was also found to have atrial fibrillation with RVR.  He was ruled in for myocardial infarction.  Percutaneous cholecystostomy tube was placed.  He underwent carotid endarterectomy and coronary artery bypass grafting in August 2017.  In October 2017, he underwent laparoscopic cholecystectomy by Dr. Dalbert Batman.  At that time, the mesh was intact.  The periumbilical trocar was placed superior to the mesh.   Over the last year, the patient has developed significant pulmonary disease.  Last January, he presented with several months of a severe chronic cough.  He was later diagnosed with interstitial lung disease.  He is on chronic prednisone.  He requires frequent as needed home oxygen.  He is able to ambulate and is able to drive but cannot have any type of exertion without getting severely short of breath.  Over the last several months, he has developed tenderness at his umbilicus with mesh that is easily palpable.  He was evaluated in August by one of my partners.  No recurrent umbilical hernia was noted but the mesh is easily palpable underneath the skin.  It seems that the fascia is no longer intact over the hernia mesh.  CT scan at that time confirmed no ventral or umbilical hernia.  He does have small fat-containing inguinal hernias that are completely asymptomatic.   Over the last  few weeks, the patient has begun to notice a tiny edge of the mesh protruding through a small pinhole in the upper umbilicus.  No erythema.  No drainage noted.   Of note, the patient's wife has a enlarging thoracoabdominal aortic aneurysm and is scheduled to have some type of endovascular procedure performed at [facility] in the near future. Review of Systems: A complete review of systems was obtained from the patient.  I have reviewed this information and discussed as appropriate with the patient.  See HPI as well for other ROS.   Review of Systems  Constitutional: Negative.   HENT: Negative.    Eyes: Negative.   Respiratory:  Positive for cough and shortness of breath.   Cardiovascular: Negative.   Gastrointestinal:  Positive for abdominal pain.  Genitourinary: Negative.   Musculoskeletal: Negative.   Skin: Negative.   Neurological: Negative.   Endo/Heme/Allergies: Negative.   Psychiatric/Behavioral: Negative.          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 (CMS-HCC) 08/12/2021   Pleural effusion     Thyroid disease  Patient Active Problem List  Diagnosis   Anxiety state   Bilateral carotid artery disease (CMS-HCC)   Chronic interstitial lung disease (CMS-HCC)   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 (CMS-HCC)   Chronic ischemic heart disease   NSTEMI (non-ST elevated myocardial infarction) (CMS-HCC)   CKD (chronic kidney disease), stage II   Aortic atherosclerosis (CMS-HCC)   Depressive disorder, not elsewhere classified   Situational depression   Diabetes  mellitus type 2, uncontrolled   Essential hypertension   Essential (primary) hypertension   Generalized anxiety disorder   HLD (hyperlipidemia)   Hyponatremia   Hypothyroidism   Left low back pain   Left ventricular diastolic dysfunction   Occlusion and stenosis of bilateral carotid arteries   S/P CABG x 4   Rectus diastasis   Umbilical hernia   High blood pressure   Erectile dysfunction   Edema, unspecified   Dizziness   Diabetes mellitus (CMS-HCC)   Coronary atherosclerosis of native coronary artery   Carotid stenosis   GERD (gastroesophageal reflux disease)   Family history of colon cancer   Colon cancer screening   History of hemorrhoids   Diarrhea due to drug   Tubular adenoma of colon      Past Surgical History       Past Surgical History:  Procedure Laterality Date   umbilical hernia removal   10/31/2013   CORONARY ARTERY BYPASS GRAFT   11/14/2015    4 bypass cabg   Colon @ [facility]   11/19/2021    Tubular adenoma/FHx CC/Repeat 35yr/TKT   [facility]  11/19/2021    Gastritis/Normal EGD biopsy/No Repeat/TKT   CARDIAC CATHETERIZATION   11/05/2015        Allergies       Allergies  Allergen Reactions   Amlodipine Swelling   Bupropion Hcl Other (See Comments)      insomnia   Chlorthalidone Unknown   Metformin Diarrhea   Penicillin Vomiting   Terazosin Other (See Comments)              Current Outpatient Medications on File Prior to Visit  Medication Sig Dispense Refill   albuterol 90 mcg/actuation inhaler Inhale 2 inhalations into the lungs every 4 (four) hours as needed for Wheezing 1 each 3   ALPRAZolam (XANAX) 0.5 MG tablet TAKE 1 TABLET AT BEDTIME ASNEEDED FOR SLEEP FOR UP TO 90 DAYS 90 tablet 0   aspirin 81 MG EC tablet Take 1 tablet (81 mg total) by mouth once daily       atorvastatin (LIPITOR) 40 MG tablet Take 1 tablet (40 mg total) by mouth once daily 90 tablet 3   blood glucose diagnostic test strip Use 2 (two) times daily 200 each 3   blood  glucose meter kit Use as directed. 1 each 0   budesonide (PULMICORT) 0.25 mg/2 mL nebulizer solution Take 2 mLs (0.25 mg total) by nebulization 2 (two) times daily for 90 days (Patient not taking: Reported on 01/22/2022) 360 mL 0   carvediloL (COREG) 12.5 MG tablet Take 1 tablet (12.5 mg total) by mouth 2 (two) times daily with meals 180 tablet 3   FUROsemide (LASIX) 20 MG tablet Take 1 tablet (20 mg total) by mouth once daily 90 tablet 0   HYDROcodone-chlorpheniramine (TUSSIONEX) 10-8 mg/5 mL ER suspension Take 5 mLs by mouth every 12 (twelve) hours as needed for Cough 120 mL 0   ipratropium-albuteroL (DUO-NEB) nebulizer solution INHALE THE CONTENTS OF 1  VIAL VIA NEBULIZER TWO     TIMES A DAY (Patient not taking: Reported on 01/22/2022) 540 mL 1   lancets Use 1 each 2 (two) times daily 200 each 3   levothyroxine (SYNTHROID) 100 MCG tablet Take 1 tablet (100 mcg total) by mouth once daily Take on an empty stomach with a glass of water at least 30-60 minutes before breakfast. 90 tablet 3   loperamide (IMODIUM A-D) 2 mg tablet Take 1 tablet (2 mg total) by mouth 4 (four) times daily as needed for Diarrhea 180 tablet 3   mometasone (ASMANEX TWISTHALER) 220 mcg/ actuation (30) inhaler Inhale 1 inhalation into the lungs once daily (Patient not taking: Reported on 01/22/2022) 1 each 3   nintedanib 150 mg Cap Take 1 capsule by mouth 2 (two) times daily       omeprazole (PRILOSEC) 20 MG DR capsule Take 1 capsule (20 mg total) by mouth once daily 90 capsule 3   potassium chloride (KLOR-CON) 10 mEq ER tablet Take 2 tabs BID x3 days, then 1 tab daily 90 tablet 1   predniSONE (DELTASONE) 10 MG tablet Take 1 tablet by mouth daily with breakfast       tadalafiL (CIALIS) 10 MG tablet Take 10 mg by mouth once daily as needed for Erectile Dysfunction Take dose 30-45 min prior to anticipated sexual activity.       tiotropium-olodateroL (STIOLTO RESPIMAT) 2.5-2.5 mcg/actuation inhaler Inhale 2 inhalations into the lungs  once daily (Patient not taking: Reported on 01/22/2022) 4 g 3    No current facility-administered medications on file prior to visit.      Family History       Family History  Problem Relation Age of Onset   Colon cancer Mother     High blood pressure (Hypertension) Mother     Thyroid disease Father     Prostate cancer Father     High blood pressure (Hypertension) Father     Diabetes type II Maternal Grandmother     Lung cancer Maternal Grandmother          Social History        Tobacco Use  Smoking Status Former   Packs/day: 1.00   Years: 40.00   Additional pack years: 0.00   Total pack years: 40.00   Types: Cigarettes   Quit date: 11/01/2015   Years since quitting: 6.4  Smokeless Tobacco Never      Social History  Social History         Socioeconomic History   Marital status: Married  Occupational History   Occupation: retired  Tobacco Use   Smoking status: Former      Packs/day: 1.00      Years: 40.00      Additional pack years: 0.00      Total pack years: 40.00      Types: Cigarettes      Quit date: 11/01/2015      Years since quitting: 6.4   Smokeless tobacco: Never  Vaping Use   Vaping Use: Never used  Substance and Sexual Activity   Alcohol use: Yes      Alcohol/week: 2.0 standard drinks of alcohol      Types: 2 Cans of beer per week   Drug use: No   Sexual activity: Yes      Partners: Female      Birth control/protection: Post-menopausal    Social Determinants of Health        Financial Resource Strain: Low Risk  (  01/06/2022)    Overall Financial Resource Strain (CARDIA)     Difficulty of Paying Living Expenses: Not very hard  Food Insecurity: No Food Insecurity (01/06/2022)    Hunger Vital Sign     Worried About Running Out of Food in the Last Year: Never true     Ran Out of Food in the Last Year: Never true  Transportation Needs: No Transportation Needs (01/06/2022)    PRAPARE - Armed forces logistics/support/administrative officer (Medical): No      Lack of Transportation (Non-Medical): No  Housing Stability: Unknown (01/06/2022)    Housing Stability Vital Sign     Unable to Pay for Housing in the Last Year: No     Number of Places Lived in the Last Year: 1        Objective:          Physical Exam    Constitutional:  WDWN in NAD, conversant, no obvious deformities; lying in bed comfortably Eyes:  Pupils equal, round; sclera anicteric; moist conjunctiva; no lid lag HENT:  Oral mucosa moist; good dentition  Neck:  No masses palpated, trachea midline; no thyromegaly Lungs:  CTA bilaterally; normal respiratory effort.  The patient becomes short of breath with minimal exertion. CV:  Regular rate and rhythm; no murmurs; extremities well-perfused with no edema Abd:  +bowel sounds, soft, non-tender, no palpable organomegaly; no palpable hernias;  The umbilical incision is well-healed.  In the upper part of the umbilicus, there is a small pinhole with what appears to be some protruding mesh.  No erythema or drainage is noted.  This area is minimally tender.  No sign of recurrent hernia with Valsalva maneuver. Musc: Normal gait; no apparent clubbing or cyanosis in extremities Lymphatic:  No palpable cervical or axillary lymphadenopathy Skin:  Warm, dry; no sign of jaundice Psychiatric - alert and oriented x 4; calm mood and affect     Labs, Imaging and Diagnostic Testing: CLINICAL DATA:  Periumbilical abdomen pain. Previous hernia repair with mesh.   EXAM: CT ABDOMEN AND PELVIS WITH CONTRAST   TECHNIQUE: Multidetector CT imaging of the abdomen and pelvis was performed using the standard protocol following bolus administration of intravenous contrast.   RADIATION DOSE REDUCTION: This exam was performed according to the departmental dose-optimization program which includes automated exposure control, adjustment of the mA and/or kV according to patient size and/or use of iterative reconstruction technique.   CONTRAST:   16m ISOVUE-300 IOPAMIDOL (ISOVUE-300) INJECTION 61%   COMPARISON:  November 04, 2015   FINDINGS: Lower chest: Patchy ground-glass opacity and increased pulmonary markings are identified throughout bilateral lung bases more prominently involving the right lung base compared to left. Small left pleural effusion is noted. The heart size is enlarged.   Hepatobiliary: There is diffuse low density of the liver without focal liver lesion. Prior cholecystectomy. The biliary tree is normal.   Pancreas: Unremarkable. No pancreatic ductal dilatation or surrounding inflammatory changes.   Spleen: Normal in size without focal abnormality.   Adrenals/Urinary Tract: Adrenal glands are unremarkable. Kidneys are normal, without renal calculi, focal lesion, or hydronephrosis. Bladder is unremarkable. 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   Stomach/Bowel: There is mild diffuse bowel wall thickening of the sigmoid colon. There is bowel wall thickening of a segment of small bowel loop in the posterior midabdomen. The appendix is normal. Minimal hiatal hernia is identified. The stomach is otherwise normal.   Vascular/Lymphatic: Aortic atherosclerosis. No enlarged abdominal or pelvic lymph nodes.  Reproductive: Prostate calcifications identified.   Other: Minimal bilateral inguinal herniation of mesenteric fat is identified. No evidence of ventral hernia.   Musculoskeletal: No acute abnormality.   IMPRESSION: 1. Mild diffuse bowel wall thickening of the sigmoid colon. There is bowel wall thickening of a segment of small bowel loop in the posterior midabdomen. The findings are nonspecific but can be seen in infectious or inflammatory etiology. 2. Fatty infiltration of liver. 3. Patchy ground-glass opacity and increased pulmonary markings are identified throughout bilateral lung bases more prominently involving the right lung base compared to left. This is  nonspecific but can be seen in infectious/inflammatory etiology.   Aortic Atherosclerosis (ICD10-I70.0).     Electronically Signed   By: Abelardo Diesel M.D.   On: 12/11/2021 12:27   Assessment and Plan:  Diagnoses and all orders for this visit:   Umbilical pain   Hx of umbilical hernia repair     We will first obtain cardiac and pulmonary clearance from his cardiologist and pulmonologist.  He will need to hold his aspirin for 5 days prior to surgery.  It is unclear what his pulmonologist wants to do with his steroids.  Apparently there had been some discussion about switching medications and getting him off prednisone.   After obtaining clearance, we will proceed with surgery.  He needs to have the old mesh excised.  We will then examine the remaining fascia.  He most likely needs to have a another hernia repair with a different type of mesh.  If there is sign of infection from the protruding mesh, we will not likely use synthetic mesh due to the risk of infection.  We will have to decide the type of repair at the time of surgery.   The surgical procedure has been discussed with the patient.  Potential risks, benefits, alternative treatments, and expected outcomes have been explained.  All of the patient's questions at this time have been answered.  The likelihood of reaching the patient's treatment goal is good.  The patient understand the proposed surgical procedure and wishes to proceed.     No follow-ups on file.   Shaya Reddick Jearld Adjutant, MD  04/08/2022 2:50 PM

## 2022-04-08 NOTE — Telephone Encounter (Signed)
Spoke with patients wife. She states the patient has been coughing more with exertion over the last week, he is currently taking the prednisone 27m and this does not seem to help.  Dr. RChase Callercan you please advise? Also what mg of the Esbriet did you want him on

## 2022-04-09 ENCOUNTER — Telehealth: Payer: Self-pay | Admitting: Pulmonary Disease

## 2022-04-09 ENCOUNTER — Telehealth: Payer: Self-pay | Admitting: Cardiovascular Disease

## 2022-04-09 ENCOUNTER — Telehealth: Payer: Self-pay | Admitting: Internal Medicine

## 2022-04-09 MED ORDER — PIRFENIDONE 267 MG PO TABS: 801.0000 mg | ORAL_TABLET | Freq: Three times a day (TID) | ORAL | 4 refills | Status: DC

## 2022-04-09 MED ORDER — PIRFENIDONE 267 MG PO TABS: ORAL_TABLET | ORAL | 0 refills | Status: DC

## 2022-04-09 NOTE — Telephone Encounter (Signed)
[facility] calling on behalf of this PT. Pls call@  [phone] Kayleen Memos

## 2022-04-09 NOTE — Telephone Encounter (Signed)
   Pre-operative Risk Assessment    Patient Name: Brian Casey  DOB: [birth year] MRN: [id number]      Request for Surgical Clearance    Procedure:   Hernia surgery  Date of Surgery:  Clearance TBD                                 Surgeon:  Dr Donnie Mesa Surgeon's Group or Practice Name:  [facility] Phone number:  [phone] Fax number:  [phone]   Type of Clearance Requested:   - Medical    Type of Anesthesia:  Not Indicated   Additional requests/questions:    Manfred Arch   04/09/2022, 9:11 AM

## 2022-04-09 NOTE — Telephone Encounter (Signed)
Sent pt a Pharmacist, community message with the instructions/recommendations per Dr. Chase Caller. Will await a response from pt about the prednisone to see if he needs to have a new prescription sent to pharmacy.

## 2022-04-09 NOTE — Telephone Encounter (Signed)
Preoperative team, patient has follow-up appointment with Dr. Rockey Situ scheduled on 05/18/2022.  Surgery date is TBD.  Please add preoperative cardiac evaluation to appointment notes.  I will defer preoperative cardiac evaluation to Dr. Rockey Situ at that time.  Thank you for your help.  Jossie Ng. Jayline Kilburg NP-C     04/09/2022, 10:17 AM [address] Office [phone] Fax [phone]

## 2022-04-09 NOTE — Telephone Encounter (Signed)
Please refer to other open encounter from 1/18.

## 2022-04-09 NOTE — Telephone Encounter (Signed)
[facility]ed [facility] back at the number provided- LMTCB

## 2022-04-09 NOTE — Telephone Encounter (Signed)
Pt is scheduled to see Dr. Rockey Situ 05/18/22, clearance will be addressed at that time.  Will route back to [facility] to make them aware.

## 2022-04-09 NOTE — Telephone Encounter (Signed)
Received notification from front desk that patient was reaching out about pirfenidone new start. Patient is connected to [place] through [facility]. Rx has been sent to [facility]. Clinicals faxed to [facility] today.   Phone: [phone] Fax: [phone]  Knox Saliva, PharmD, MPH, BCPS, CPP Clinical Pharmacist (Rheumatology and Pulmonology)

## 2022-04-09 NOTE — Telephone Encounter (Signed)
[facility] calling regarding Script for 2 LT "at night" but no sleep study or supporting documentation.  Also ordered a 10 LT concentrator and again, no supporting evidence or sleep study incl.   Eldridge Abrahams [phone]  FAX [phone]

## 2022-04-09 NOTE — Telephone Encounter (Signed)
Spoke with the pt's spouse  She is asking for update on Esbriet rx  She wants this sent to the [employer] to [employer] team

## 2022-04-09 NOTE — Telephone Encounter (Signed)
Called and spoke with Brian Casey at the [place] about the order for why pt needs 2L at night and also the 10L concentrator. Have faxed the ONO report as well as pt's OV and the walk test from October 2023 which showed that pt needed more than 4L. Nothing further needed.

## 2022-04-13 ENCOUNTER — Telehealth: Payer: Self-pay | Admitting: Internal Medicine

## 2022-04-13 NOTE — Telephone Encounter (Signed)
Spoke with pt's wife (per DPR) who states Kat from [place] need documentation that states pt requires 6L of O2. ATC Kat and left VM for her to call back. Faxed over walk from 01/13/22 and note from 03/12/22 both of which stated pt needs 6 L of O2. Updated pt as well.

## 2022-04-14 ENCOUNTER — Other Ambulatory Visit: Payer: Self-pay | Admitting: Internal Medicine

## 2022-04-15 ENCOUNTER — Telehealth: Payer: Self-pay | Admitting: Internal Medicine

## 2022-04-15 NOTE — Telephone Encounter (Signed)
Fax received from Dr. Donnie Mesa with [facility] to perform a Hernia surgery with general anesthesia on patient.  Patient needs surgery clearance. Surgery is PENDING. Patient was seen on 03/12/2022. Office protocol is a risk assessment can be sent to surgeon if patient has been seen in 60 days or less.   Sending to Dr Chase Caller for risk assessment or recommendations if patient needs to be seen in office prior to surgical procedure.    Sent patient a mychart message to call and schedule a follow up visit with MR or Tammy for his follow up visit. And just in case he is still needing a visit for surgical clearance because last visit was a video visit.

## 2022-04-15 NOTE — Telephone Encounter (Signed)
Called and spoke with pt. Pt has been scheduled for a clearance visit with Tammy February 6 at 10:30.  Routing to surgical clearance pool as an FYI.

## 2022-04-16 NOTE — Telephone Encounter (Signed)
HE is on llot of oxygen and needed 6L [place] to walk 3 laps  in our office. This adds risk. I would need to Dr Georgette Dover his surgeon directly. Please call the surgeons office [facility]) and have Dr Evette Cristal call me directly on my cell. I will in paralllel try to reach Dr Georgette Dover via secure chat  Pls send message back to me

## 2022-04-27 ENCOUNTER — Telehealth: Payer: Self-pay | Admitting: *Deleted

## 2022-04-27 ENCOUNTER — Other Ambulatory Visit: Payer: Self-pay | Admitting: *Deleted

## 2022-04-27 DIAGNOSIS — J849 Interstitial pulmonary disease, unspecified: Secondary | ICD-10-CM

## 2022-04-27 NOTE — Telephone Encounter (Signed)
Called and spoke with patient/wife regarding appointment tomorrow.  He has a hernia and the mesh is protruding through the skin.  He needs to see if he can get surgical clearance for surgery.  Advised that we will keep his appointment for tomorrow.  He had his last pft 12/12/21 and Dr. Chase Caller had recommend a spiro/dlco prior to f/u.  Advised I will contact the [facility] and see if we can get him scheduled for a PFT asap.  Advised we will call them back regarding the pft and will see them tomorrow.  They verbalized understanding.  Rodena Piety, can we get this patient scheduled for a PFT (spiro and dlco) asap?

## 2022-04-28 ENCOUNTER — Telehealth: Payer: Self-pay | Admitting: Internal Medicine

## 2022-04-28 ENCOUNTER — Encounter: Payer: Self-pay | Admitting: Adult Health

## 2022-04-28 ENCOUNTER — Ambulatory Visit (INDEPENDENT_AMBULATORY_CARE_PROVIDER_SITE_OTHER): Payer: No Typology Code available for payment source | Admitting: Adult Health

## 2022-04-28 VITALS — BP 132/70 | HR 82 | Temp 98.3°F | Ht 67.0 in | Wt 185.6 lb

## 2022-04-28 DIAGNOSIS — Z01811 Encounter for preprocedural respiratory examination: Secondary | ICD-10-CM

## 2022-04-28 DIAGNOSIS — J849 Interstitial pulmonary disease, unspecified: Secondary | ICD-10-CM | POA: Diagnosis not present

## 2022-04-28 DIAGNOSIS — J9611 Chronic respiratory failure with hypoxia: Secondary | ICD-10-CM | POA: Insufficient documentation

## 2022-04-28 NOTE — Assessment & Plan Note (Signed)
Pulmonary preop risk assessment. Patient has underlying ILD/ progressive chronic hypersensitivity pneumonitis and is maintained on continuous oxygen up to 4 to 6 L.-Most recent PFTs in September showed significantly low diffusing capacity at 22%. Given his severe underlying lung disease, age, and comorbidities .  Surgical patient would be a high surgical risk from a pulmonary standpoint.  I reviewed with patient and his wife regarding the potential pulmonary risk.   Major Pulmonary risks identified in the multifactorial risk analysis are but not limited to a) pneumonia; b) recurrent intubation risk; c) prolonged or recurrent acute respiratory failure needing mechanical ventilation; d) prolonged hospitalization; e) DVT/Pulmonary embolism; f) Acute Pulmonary edema  If surgery is mandatory would recommend  Short duration of surgery as much as possible and avoid paralytic if possible  Recovery in step down or ICU with Pulmonary consultation if indicated.   Aggressive pulmonary toilet with o2, bronchodilatation, and incentive spirometry and early ambulation

## 2022-04-28 NOTE — Assessment & Plan Note (Signed)
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  Progressive changes noted on CT scan.  Patient was unable to tolerate Ofev.  He has recently started on Esbriet and is beginning his slow titration.  So far seems to be tolerating. Was able to go to pulmonary rehab partially.   advised on activities as home as tolerated. Continue on low-dose prednisone 10 mg daily for now. Once stable on Esbriet can consider slowly weaning off of steroids.  Plan  Patient Instructions  Continue on with Esbriet titration as directed  Continue on Oxygen 4l/m rest and 6l/m with walking  Continue on Prednisone '10mg'$  daily.  Activity as tolerated.  I will send note to surgeon.  Saline nasal gel Twice daily   Follow up with Dr. Chase Caller or Armstrong Creasy NP in 4 weeks and As needed

## 2022-04-28 NOTE — Telephone Encounter (Signed)
  1) RISK FOR PROLONGED MECHANICAL VENTILAION - > 48h  1A) Arozullah - Prolonged mech ventilation risk Arozullah Postperative Pulmonary Risk Score - for mech ventilation dependence >48h [employer], Ann Surg 2000, major non-cardiac surgery) Comment Score  Type of surgery - abd ao aneurysm (27), thoracic (21), neurosurgery / upper abdominal / vascular (21), neck (11) Abd hernia - no laparatomy 11 (giving half points)  Emergency Surgery - (11) no 0  ALbumin < 3 or poor nutritional state - (9) 3.7 0  BUN > 30 -  (8) 13 0  Partial or completely dependent functional status - (7) functiona 0  COPD -  (6) ILD 6  Age - 60 to 69 (4), > 70  (6) 70 6  TOTAL  23  Risk Stratifcation scores  - < 10 (0.5%), 11-19 (1.8%), 20-27 (4.2%), 28-40 (10.1%), >40 (26.6%)  Moderate high riosk      Tammy thanks for the secure chat -> this is what I got some weeks ago from Dr Georgette Dover (see below). I told him about which I agree is moderate-high to high risk . He reports surgery needs to be done. I recommended ICU stay post op   [id number]  From Dr Georgette Dover   This patient needs to have surgery to repair an old umbilical hernia.  His old mesh is beginning to protrude through the skin.  It is likely that the chronic cough and steroids have caused the fascia anterior to the mesh to fail.  Is he cleared for general anesthesia from your perspective.  What do you want to do with his chronic Prednisone?   We will also touch base with his cardiologist for clearance as well.   Thanks

## 2022-04-28 NOTE — Progress Notes (Signed)
$'@Patient'A$  ID: York Cerise, male    DOB: 1950/11/13, 72 y.o.   MRN: [id number]  Chief Complaint  Patient presents with   surgical clearance    Referring provider: [name], MD  HPI: 72 year old male former smoker followed for interstitial lung disease felt secondary to hypersensitivity pneumonitis. 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 Worsning GGO and reticulation. Trace left effuion  High-resolution CT chest November 13, 2021 shows widespread groundglass attenuation, septal thickening, subpleural reticulation, bronchiectasis, developing honeycombing, minimum progression but definite progression over earlier scans.  Suggestive of alternative diagnosis  2019 June CT scan of the chest: Read by Dr. Polly Cobia.  Concerning for ILD.  Reticulation also left-sided pleural effusion   May 2021 high-resolution CT chest: Read by Dr. Polly Cobia.  Indeterminate pattern for UIP versus alternate diagnosis.Marland Kitchen  Definite of ILD.  There is subpleural sparing.  Left-sided pleural effusion present.  Findings deemed is unchanged from prior note seventh  Right heart cath 02/03/2022- PAP 65mHg. -> does not meet criteria for tyvaso   PFTs December 12, 2021 FEV1 at 44%, ratio 89, FVC 36%, no significant bronchodilator response, DLCO is 22%.  04/28/2022 Follow up : ILD, chronic respiratory failure on oxygen, preop surgical clearance Patient returns for a 292-monthollow-up.  Patient has underlying interstitial lung disease felt to be progressive hypersensitivity pneumonitis.  He has a heavy exposure to asbestos with previous work at [employer] Patient has previously been on Ofev but was unable to tolerate.  He was transition to EsEcolabnd started last week.  Patient says he has been tolerating and has been going through the titration process.  He is  currently on 2 capsules 3 times daily.  He denies any significant nausea vomiting or diarrhea.  Patient says overall breathing is doing about the same.  He gets short of breath with heavy activities.  He remains on oxygen at 4 L at rest and can go up to 6 L with walking or exercise.  Today in the office walk test shows adequate saturations on 4 L while ambulating.  O2 saturation off of oxygen on room air is 84%.  He is on chronic steroids currently at prednisone 10 mg his baseline dose.  Was treated for ILD flare last visit with a short prednisone burst.  Patient says symptoms did improve and he went back to baseline.  He was treated with a recent Z-Pak couple weeks ago for a bronchitis.  He says his cough and congestion have improved.  He does complain of nasal stuffiness and dryness. Most recent PFTs in September 2023 showed severe restriction with an FEV1 at 44% and FVC at 36% DLCO was very low at 22%.  Patient is here for a preop pulmonary risk assessment.  He has a history of umbilical hernia repair in 2015.  Over the last several months patient has had umbilical pain.  He has recently been seen by general surgery and noted to have a palpable mesh along the umbilicus with small protruding edge of mesh at the upper umbilicus.  He has been recommended to have surgical repair.  Patient is able to do basic ADLs at home.  But gets very winded with minimal activities.  He requires oxygen at least at 4 L at rest and up to 6 L with exercise.  He is on chronic steroids with prednisone at  10 mg daily.    Allergies  Allergen Reactions   Penicillins Nausea And Vomiting and Other (See Comments)    REACTION: Vomiting Has patient had a PCN reaction causing immediate rash, facial/tongue/throat swelling, SOB or lightheadedness with hypotension: Yes Has patient had a PCN reaction causing severe rash involving mucus membranes or skin necrosis: No Has patient had a PCN reaction that required hospitalization No Has  patient had a PCN reaction occurring within the last 10 years: No If all of the above answers are "NO", then may proceed with Cephalosporin use.   Amlodipine Swelling   Bupropion    Bupropion Hcl     REACTION: Severe constipation, insomnia   Chlorthalidone Other (See Comments)    Other reaction(s): Hyponatremia   Metformin Diarrhea   Ofev [Nintedanib] Diarrhea   Terazosin Other (See Comments)    Immunization History  Administered Date(s) Administered   Fluad Quad(high Dose 65+) 01/13/2022   Influenza Inj Mdck Quad Pf 12/25/2015   Influenza Split 01/26/2011, 02/10/2012   Influenza Whole 01/22/2004, 02/05/2008, 12/26/2008   Influenza,inj,Quad PF,6+ Mos 12/29/2012, 01/10/2014, 12/11/2014   Influenza-Unspecified 12/25/2016, 12/22/2017, 12/21/2018, 12/07/2019, 12/24/2020   PFIZER Comirnaty(Gray Top)Covid-19 Tri-Sucrose Vaccine 04/09/2019, 05/02/2019, 12/28/2019, 01/01/2021   Pneumococcal Conjugate-13 03/29/2014, 11/06/2014, 11/15/2017   Pneumococcal Polysaccharide-23 11/05/2016   RSV,unspecified 02/19/2022   Td 08/22/1998, 08/18/2013   Unspecified SARS-COV-2 Vaccination 02/20/2022   Zoster, Live 03/29/2014    Past Medical History:  Diagnosis Date   Adenomatous colon polyp    Anxiety    Aortic root dilatation ([place])    Noted on echo in August 2023.  Measured 39 mm.   Arthritis    "lower spine; fingers" (11/07/2015)   CAD (coronary artery disease)    a. 10/2015 NSTEMI/Cath: LM 40, LAD 70p, LCX 90ost, RI 90ost, RCA 60-70p, 90-75md, 40-50d; b. 10/2015 CABG x 4: LIMA->LAD, VG->Diag, VG->RI, VG->RPDA.   CKD (chronic kidney disease), stage III (HCC)    Depression    Diastolic dysfunction    a. 10/2021 Echo: EF 60-65%,   Diverticulosis    GERD (gastroesophageal reflux disease)    High cholesterol    History of bronchitis    History of Hyperthyroidism    "had it radiated in his ''63s   Hypertension 10/17/2013   Hypothyroidism    ILD (interstitial lung disease) ([facility]    PAD  (peripheral artery disease) (HCC)    s/p left carotid endarterectomy   Post-op atrial fibrillation (HCC) 11/06/2015   Shortness of breath    Type II diabetes mellitus ([place]    type II   Wears glasses     Tobacco History: Social History   Tobacco Use  Smoking Status Former   Packs/day: 1.00   Years: 48.00   Total pack years: 48.00   Types: Cigarettes   Quit date: 11/07/2015   Years since quitting: 6.4  Smokeless Tobacco Never   Counseling given: Not Answered   Outpatient Medications Prior to Visit  Medication Sig Dispense Refill   aspirin EC 81 MG tablet Take 81 mg by mouth daily. Swallow whole.     atorvastatin (LIPITOR) 40 MG tablet Take 40 mg by mouth daily.     carvedilol (COREG) 12.5 MG tablet Take 12.5 mg by mouth 2 (two) times daily with a meal.     furosemide (LASIX) 20 MG tablet Take 20 mg by mouth daily.     levothyroxine (SYNTHROID) 100 MCG tablet Take 100 mcg by mouth daily before breakfast.     Loperamide HCl (IMODIUM PO)  Take 1 mg by mouth as needed (PT takes as needed).     omeprazole (PRILOSEC) 20 MG capsule Take 1 capsule (20 mg total) by mouth daily. OFFICE VISIT REQUIRED FOR ADDITIONAL REFILLS 30 capsule 0   Pirfenidone 267 MG TABS Month 1: Take 1 tab three times daily for 7 days, then 2 tabs three times daily for 7 days, then 3 tabs three times daily thereafter. 270 tablet 0   Pirfenidone 267 MG TABS Take 3 tablets (801 mg total) by mouth 3 (three) times daily with meals. Month 2 and onwards 270 tablet 4   potassium chloride (KLOR-CON M) 10 MEQ tablet Take 2 tabs BID x3 days, then 1 tab daily     predniSONE (DELTASONE) 10 MG tablet TAKE 1 TABLET DAILY WITH   BREAKFAST 90 tablet 0   tadalafil (CIALIS) 5 MG tablet Take 1 tablet by mouth as needed.     No facility-administered medications prior to visit.     Review of Systems:   Constitutional:   No  weight loss, night sweats,  Fevers, chills,  +fatigue, or  lassitude.  HEENT:   No headaches,   Difficulty swallowing,  Tooth/dental problems, or  Sore throat,                No sneezing, itching, ear ache, nasal congestion, post nasal drip,   CV:  No chest pain,  Orthopnea, PND, swelling in lower extremities, anasarca, dizziness, palpitations, syncope.   GI  No heartburn, indigestion, abdominal pain, nausea, vomiting, diarrhea, change in bowel habits, loss of appetite, bloody stools.   Resp:   No chest wall deformity  Skin: no rash or lesions.  GU: no dysuria, change in color of urine, no urgency or frequency.  No flank pain, no hematuria   MS:  No joint pain or swelling.  No decreased range of motion.  No back pain.    Physical Exam  BP 132/70 (BP Location: Left Arm, Patient Position: Sitting, Cuff Size: Normal)   Pulse 82   Temp 98.3 F (36.8 C) (Oral)   Ht '5\' 7"'$  (1.702 m)   Wt 185 lb 9.6 oz (84.2 kg)   SpO2 91%   BMI 29.07 kg/m   GEN: A/Ox3; pleasant , NAD, well nourished .  Chronically ill-appearing, elderly on oxygen   HEENT:  [place]/AT,  , NOSE-clear, THROAT-clear, no lesions, no postnasal drip or exudate noted.   NECK:  Supple w/ fair ROM; no JVD; normal carotid impulses w/o bruits; no thyromegaly or nodules palpated; no lymphadenopathy.    RESP bibasilar crackles   no accessory muscle use, no dullness to percussion  CARD:  RRR, no m/r/g, no peripheral edema, pulses intact, no cyanosis or clubbing.  GI:   Soft & nt; nml bowel sounds; no organomegaly or masses detected.   Musco: Warm bil, no deformities or joint swelling noted.   Neuro: alert, no focal deficits noted.    Skin: Warm, no lesions or rashes    Lab Results:   BMET   BNP   ProBNP No results found for: "PROBNP"  Imaging: No results found.       Latest Ref Rng & Units 12/12/2021   10:02 AM 11/11/2015    7:38 AM  PFT Results  FVC-Pre L 1.41  1.20   FVC-Predicted Pre % 35  28   FVC-Post L 1.46  1.35   FVC-Predicted Post % 36  32   Pre FEV1/FVC % % 89  60   Post FEV1/FCV % %  89   56   FEV1-Pre L 1.26  0.72   FEV1-Predicted Pre % 43  22   FEV1-Post L 1.30  0.76   DLCO uncorrected ml/min/mmHg 5.38  8.93   DLCO UNC% % 22  31   DLCO corrected ml/min/mmHg 5.38  10.84   DLCO COR %Predicted % 22  38   DLVA Predicted % 59  119   TLC L 2.23  2.47   TLC % Predicted % 34  38   RV % Predicted % 33  61     No results found for: "NITRICOXIDE"           Assessment & Plan:   ILD (interstitial lung disease) ([place]) 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  Progressive changes noted on CT scan.  Patient was unable to tolerate Ofev.  He has recently started on Esbriet and is beginning his slow titration.  So far seems to be tolerating. Was able to go to pulmonary rehab partially.   advised on activities as home as tolerated. Continue on low-dose prednisone 10 mg daily for now. Once stable on Esbriet can consider slowly weaning off of steroids.  Plan  Patient Instructions  Continue on with Esbriet titration as directed  Continue on Oxygen 4l/m rest and 6l/m with walking  Continue on Prednisone '10mg'$  daily.  Activity as tolerated.  I will send note to surgeon.  Saline nasal gel Twice daily   Follow up with Dr. Chase Caller or Trudi Morgenthaler NP in 4 weeks and As needed        Chronic respiratory failure with hypoxia ([place]) Continue on oxygen to maintain O2 saturations greater than 88 to 90%.  Currently using oxygen 4 L at rest.  And up to 6 L with activity as needed. Today's walk test shows no increased oxygen demands.  Preop pulmonary/respiratory exam Pulmonary preop risk assessment. Patient has underlying ILD/ progressive chronic hypersensitivity pneumonitis and is maintained on continuous oxygen up to 4 to 6 L.-Most recent PFTs in September showed significantly low diffusing capacity at  22%. Given his severe underlying lung disease, age, and comorbidities .  Surgical patient would be a high surgical risk from a pulmonary standpoint.  I reviewed with patient and his wife regarding the potential pulmonary risk.   Major Pulmonary risks identified in the multifactorial risk analysis are but not limited to a) pneumonia; b) recurrent intubation risk; c) prolonged or recurrent acute respiratory failure needing mechanical ventilation; d) prolonged hospitalization; e) DVT/Pulmonary embolism; f) Acute Pulmonary edema  If surgery is mandatory would recommend  Short duration of surgery as much as possible and avoid paralytic if possible  Recovery in step down or ICU with Pulmonary consultation if indicated.   Aggressive pulmonary toilet with o2, bronchodilatation, and incentive spirometry and early ambulation      Rexene Edison, NP 04/28/2022

## 2022-04-28 NOTE — Patient Instructions (Addendum)
Continue on with Esbriet titration as directed  Continue on Oxygen 4l/m rest and 6l/m with walking  Continue on Prednisone '10mg'$  daily.  Activity as tolerated.  I will send note to surgeon.  Saline nasal gel Twice daily   Follow up with Dr. Chase Caller or Graceland Wachter NP in 4 weeks and As needed

## 2022-04-28 NOTE — Assessment & Plan Note (Signed)
Continue on oxygen to maintain O2 saturations greater than 88 to 90%.  Currently using oxygen 4 L at rest.  And up to 6 L with activity as needed. Today's walk test shows no increased oxygen demands.

## 2022-04-28 NOTE — Telephone Encounter (Signed)
Spoke with patient. He states he did receive pirfenidone at home.  He is on the second week of titration (currently taking 2 tabs three times daily).  I did advise him that refill for maintenance dose have already been sent to [facility]. He verbalized understanding.  He had OV today with Rexene Edison, NP, and states he brought Ofev product for sample donation supply.  Knox Saliva, PharmD, MPH, BCPS, CPP Clinical Pharmacist (Rheumatology and Pulmonology)

## 2022-04-29 NOTE — Telephone Encounter (Signed)
We had a PFT cancellation and I got Brian Casey scheduled for his PFT on 05/07/22 @ 3:00pm at [place]

## 2022-04-29 NOTE — Telephone Encounter (Signed)
OV notes and clearance form have been faxed back to [facility]. Nothing further needed at this time.

## 2022-04-30 NOTE — Telephone Encounter (Signed)
Okay thanks will send to surgeon

## 2022-05-04 NOTE — Telephone Encounter (Signed)
IS this the esbriet? If so - STOP. And call back in 3-7 days

## 2022-05-04 NOTE — Telephone Encounter (Signed)
Mychart message sent by pt's spouse:  TREVEL NICCOLI  P [facility] Clinical [facility] (supporting Tammy S Parrett, NP)20 minutes ago (1:01 PM)    [place] has gone up to 2 tablets three times a day.  H has nausea, chills without fever, tiredness and weakness, no appetite.  He is eating very little.  He makes himself eat maybe one peanut butter sandwich  a day and maybe a cup of applesauce one time a day.  He has tried Ginger root capsules 1562m. they are not helping much.  He feels worse than before he started these.  What do you suggest?  I do not feel he can go up on the dose .  CJori Regula wife    MR, please advise.

## 2022-05-07 ENCOUNTER — Ambulatory Visit: Payer: No Typology Code available for payment source | Attending: Internal Medicine

## 2022-05-07 DIAGNOSIS — Z87891 Personal history of nicotine dependence: Secondary | ICD-10-CM | POA: Insufficient documentation

## 2022-05-07 DIAGNOSIS — J849 Interstitial pulmonary disease, unspecified: Secondary | ICD-10-CM | POA: Insufficient documentation

## 2022-05-07 LAB — PULMONARY FUNCTION TEST ARMC ONLY
DL/VA % pred: 63 %
DL/VA: 2.59 ml/min/mmHg/L
DLCO unc % pred: 24 %
DLCO unc: 5.76 ml/min/mmHg
FEF 25-75 Pre: 1.31 L/sec
FEF2575-%Pred-Pre: 59 %
FEV1-%Pred-Pre: 43 %
FEV1-Pre: 1.24 L
FEV1FVC-%Pred-Pre: 107 %
FEV6-%Pred-Pre: 40 %
FEV6-Pre: 1.48 L
FEV6FVC-%Pred-Pre: 106 %
FVC-%Pred-Pre: 40 %
FVC-Pre: 1.57 L
Pre FEV1/FVC ratio: 79 %
Pre FEV6/FVC Ratio: 100 %

## 2022-05-11 ENCOUNTER — Telehealth: Payer: Self-pay | Admitting: Internal Medicine

## 2022-05-11 NOTE — Telephone Encounter (Signed)
Called and spoke with patient. Patient stated he stopped taking esbriet last Monday. Patient stated he wants to know what the next steps are going to be now.   MR, please advise.

## 2022-05-12 NOTE — Telephone Encounter (Signed)
Pt. Callling back to speak with nurse

## 2022-05-12 NOTE — Telephone Encounter (Signed)
Pt. Said he no longer can see the PFT on his end on my chart and was wondering why and is waiting to hear back from Dr. On advise

## 2022-05-12 NOTE — Telephone Encounter (Signed)
ATC LVMTCB x 1  

## 2022-05-13 NOTE — Telephone Encounter (Signed)
Spoke with pt who states he has not taking Esibiet since 05/04/22. He is wondering what his next steps should be. Also pt is requesting PFT results from 05/07/22 done at [facility]. Dr. Chase Caller please advise.

## 2022-05-14 NOTE — Telephone Encounter (Signed)
Did side effects of ->  nausea, chills without fever, tiredness and weakness, no appetite.  He is eating very little.  -> did this go away after stopping esbriet?  When is the umbilical hearnia surgery?   PFTsept 2-023 -> Feb 2024 -> is stable       Latest Ref Rng & Units 12/12/2021   10:02 AM 11/11/2015    7:38 AM  PFT Results  FVC-Pre L 1.41  1.20   FVC-Predicted Pre % 35  28   FVC-Post L 1.46  1.35   FVC-Predicted Post % 36  32   Pre FEV1/FVC % % 89  60   Post FEV1/FCV % % 89  56   FEV1-Pre L 1.26  0.72   FEV1-Predicted Pre % 43  22   FEV1-Post L 1.30  0.76   DLCO uncorrected ml/min/mmHg 5.38  8.93   DLCO UNC% % 22  31   DLCO corrected ml/min/mmHg 5.38  10.84   DLCO COR %Predicted % 22  38   DLVA Predicted % 59  119   TLC L 2.23  2.47   TLC % Predicted % 34  38   RV % Predicted % 33  61

## 2022-05-14 NOTE — Telephone Encounter (Signed)
Spoke to patient.  He stated that he experienced nausea, fatigue and no appetite. He stopped Esbriet on 05/04/2022. All sx have subsided since stopping Esbriet.  He has not been scheduled for surgery yet. He is needing cardiology clearance first. He has an appointment with cardiology on 05/18/2022. PFT results have been mailed to address on file per patient request.   Dr. Chase Caller, please advise. Thanks

## 2022-05-17 NOTE — Progress Notes (Unsigned)
Cardiology Office Note  Date:  05/18/2022   ID:  Alema, Rohlman 01/25/1951, MRN [id number]  PCP:  [name], MD   Chief Complaint  Patient presents with   3 month follow up    Cardiac clearance for hernia repair; not scheduled yet. Medications reviewed by the patient verbally.     HPI:  Brian Casey is a 72 y.o. male with history of  CAD s/p 4-vessel CABG, 0000000 s/p LICA endarerectomy [id number], PAF  on full-dose anticoagulation,  CKD stage II,  DM2,   HTN,  HLD,  hypothyroidism,  tobacco abuse for 48 years,  Nov 01, 2015 chronic alcohol abuse  ILD, followed by pulmonary Normal right heart catheterization November 2023 presents to clinic for follow up of his CAD And paroxysmal atrial fibrillation   Last seen by myself in clinic August 2023 Seen by one of our providers November 2023 Primary care had requested right heart catheterization which was performed February 03, 2022 Details showing normal left and right heart filling pressures Normal PA pressure  Scheduled for umbilical hearnia surgery, reports some of the mesh is poking through his skin  At baseline will use oxygen when exerting In the office today not on oxygen, saturations 95% In the past on chronic steroids with prednisone at 10 mg daily.   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 Progression of interstitial lung disease. Pattern suggests nonspecific interstitial pneumonia or idiopathic interstitial pneumonia. 2. Coronary artery calcifications and aortic atherosclerosis,stable.  Carotid u/s done through the [place] on annual basis 60-79% on the right  Wore a monitor at the [place], no atrial fib Was taken off eliquis by the [place]  EKG personally reviewed  by myself on todays visit Normal sinus rhythm rate 67 bpm no significant ST-T wave changes  Other past medical history reviewed  presented to [facility] on [facility]15 with a 3-day history of RUQ pain  CT abdomen/pelvis that showed acute acalculus cholecystitis.  noted to be in new onset Afib with RVR His troponin peaked at 0.11. His Afib was rate controlled and ultimately converted to sinus rhythm on IV amiodarone.   Echo on 8/14 showed an EF of 50-55%, no RWMA, GR2DD, PASP normal.  He underwent Lexiscan Myoview on 11/06/15 that was high-risk with moderate sized region of moderate intensity in the lateral wall, EF 68%.  LHC showed severe 4-vessel CAD.   transferred to [facility] for cardiac bypass surgery.  He underwent successful 4-vessel CABG on 11/14/2015 with LIMA to LAD, SVG to Diag, SVG to RI, SVG to PDA.    found to have severe LICA stenosis at [id number] and underwent successful left carotid endarterectomy on 8/24. He had 60-79% RICA stenosis.   gallbladder drain placed  PMH:   has a past medical history of Adenomatous colon polyp, Anxiety, Aortic root dilatation (HCC), Arthritis, CAD (coronary artery disease), CKD (chronic kidney disease), stage III ([place]), Depression, Diastolic dysfunction, Diverticulosis, GERD (gastroesophageal reflux disease), High cholesterol, History of bronchitis, History of Hyperthyroidism, Hypertension (10/17/2013), Hypothyroidism, ILD (interstitial lung disease) ([place]), PAD (peripheral artery disease) ([place]), Post-op atrial fibrillation ([place]) (11/06/2015), Shortness of breath, Type II diabetes mellitus ([place]), and Wears glasses.  PSH:    Past Surgical History:  Procedure Laterality Date   CARDIAC CATHETERIZATION Right 11/06/2015  Procedure: Left Heart Cath and Coronary Angiography;  Surgeon: Minna Merritts, MD;  Location: [place] [facility];  Service: Cardiovascular;  Laterality: Right;   CHOLECYSTECTOMY N/A 01/17/2016   Procedure: LAPAROSCOPIC CHOLECYSTECTOMY WITH  INTRAOPERATIVE CHOLANGIOGRAM;  Surgeon: Fanny Skates, MD;  Location: [place];  Service: General;  Laterality: N/A;   COLONOSCOPY     COLONOSCOPY N/A 11/19/2021   Procedure: COLONOSCOPY;  Surgeon: Toledo, Benay Pike, MD;  Location: [facility];  Service: Gastroenterology;  Laterality: N/A;   CORONARY ARTERY BYPASS GRAFT N/A 11/14/2015   Procedure: CORONARY ARTERY BYPASS GRAFTING (CABG) x 4;  Surgeon: Ivin Poot, MD;  Location: [place];  Service: Open Heart Surgery;  Laterality: N/A;   ENDARTERECTOMY Left 11/14/2015   Procedure: LEFT ENDARTERECTOMY CAROTID;  Surgeon: Waynetta Sandy, MD;  Location: [place];  Service: Vascular;  Laterality: Left;   ESOPHAGOGASTRODUODENOSCOPY N/A 11/19/2021   Procedure: ESOPHAGOGASTRODUODENOSCOPY (EGD);  Surgeon: Toledo, Benay Pike, MD;  Location: [facility];  Service: Gastroenterology;  Laterality: N/A;   FINGER GANGLION CYST EXCISION Left X 3   "index finger X 2; thumb X 1"   [place] OF MESH N/A 11/16/2013   Procedure: INSERTION OF MESH;  Surgeon: Imogene Burn. Georgette Dover, MD;  Location: [place];  Service: General;  Laterality: N/A;   IR GENERIC HISTORICAL  11/08/2015   IR PERC CHOLECYSTOSTOMY 11/08/2015 Corrie Mckusick, DO [facility]   IR GENERIC HISTORICAL  12/13/2015   IR CHOLANGIOGRAM EXISTING TUBE 12/13/2015 [facility]   IR GENERIC HISTORICAL  12/25/2015   IR CATHETER TUBE CHANGE 12/25/2015 Sandi Mariscal, MD [facility]   IR GENERIC HISTORICAL  12/12/2015   IR RADIOLOGIST EVAL & MGMT 12/12/2015 Greggory Keen, MD [facility]   RIGHT HEART CATH Right 02/03/2022   Procedure: RIGHT HEART CATH;  Surgeon: Nelva Bush, MD;  Location: [place] [facility];  Service: Cardiovascular;  Laterality: Right;   TEE WITHOUT CARDIOVERSION N/A 11/14/2015   Procedure: TRANSESOPHAGEAL ECHOCARDIOGRAM (TEE);  Surgeon: Ivin Poot, MD;  Location: [place];  Service: Open Heart Surgery;  Laterality: N/A;   UMBILICAL HERNIA  REPAIR N/A 11/16/2013   Procedure: UMBILICAL HERNIA REPAIR;  Surgeon: Imogene Burn. Georgette Dover, MD;  Location: [place];  Service: General;  Laterality: N/A;   VEIN HARVEST Right 11/14/2015   Procedure: RIGHT LEG GREATER SAPHENOUS VEIN HARVEST;  Surgeon: Ivin Poot, MD;  Location: [place];  Service: Open Heart Surgery;  Laterality: Right;    Current Outpatient Medications  Medication Sig Dispense Refill   aspirin EC 81 MG tablet Take 81 mg by mouth daily. Swallow whole.     atorvastatin (LIPITOR) 40 MG tablet Take 40 mg by mouth daily.     carvedilol (COREG) 12.5 MG tablet Take 12.5 mg by mouth 2 (two) times daily with a meal.     furosemide (LASIX) 20 MG tablet Take 20 mg by mouth daily.     levothyroxine (SYNTHROID) 100 MCG tablet Take 100 mcg by mouth daily before breakfast.     Loperamide HCl (IMODIUM PO) Take 1 mg by mouth as needed (PT takes as needed).     omeprazole (PRILOSEC) 20 MG capsule Take 1 capsule (20 mg total) by mouth daily. OFFICE VISIT REQUIRED FOR ADDITIONAL REFILLS 30 capsule 0   potassium chloride (KLOR-CON) 10 MEQ tablet Take 10 mEq by mouth daily.     predniSONE (DELTASONE) 10 MG tablet TAKE 1 TABLET DAILY WITH  BREAKFAST 90 tablet 0   tadalafil (CIALIS) 5 MG tablet Take 1 tablet by mouth as needed.     Pirfenidone 267 MG TABS Month 1: Take 1 tab three times daily for 7 days, then 2 tabs three times daily for 7 days, then 3 tabs three times daily thereafter. (Patient not taking: Reported on 05/18/2022) 270 tablet 0   Pirfenidone 267 MG TABS Take 3 tablets (801 mg total) by mouth 3 (three) times daily with meals. Month 2 and onwards (Patient not taking: Reported on 05/18/2022) 270 tablet 4   No current facility-administered medications for this visit.    Allergies:   Penicillins, Amlodipine, Bupropion, Bupropion hcl, Chlorthalidone, Metformin, Nintedanib, and Terazosin   Social History:  The patient  reports that he quit smoking about 6 years ago. His smoking use included  cigarettes. He has a 48.00 pack-year smoking history. He has never used smokeless tobacco. He reports current alcohol use of about 3.0 standard drinks of alcohol per week. He reports that he does not use drugs.   Family History:   family history includes Colon cancer in his mother; Depression in his mother; Diabetes in his maternal grandmother and mother; Hypertension in his father and mother; Hyperthyroidism in his father; Lung cancer in his maternal grandmother; Prostate cancer in his father.   Review of Systems: Review of Systems  Constitutional: Negative.   Respiratory: Negative.    Cardiovascular: Negative.   Gastrointestinal: Negative.   Musculoskeletal: Negative.   Neurological:  Positive for dizziness.  Psychiatric/Behavioral: Negative.    All other systems reviewed and are negative.   PHYSICAL EXAM: VS:  BP (!) 110/54 (BP Location: Left Arm, Patient Position: Sitting, Cuff Size: Normal)   Pulse 67   Ht '5\' 7"'$  (1.702 m)   Wt 185 lb (83.9 kg)   SpO2 95%   BMI 28.98 kg/m  , BMI Body mass index is 28.98 kg/m. Constitutional:  oriented to person, place, and time. No distress.  HENT:  Head: Grossly normal Eyes:  no discharge. No scleral icterus.  Neck: No JVD, no carotid bruits  Cardiovascular: Regular rate and rhythm, no murmurs appreciated Pulmonary/Chest: Clear to auscultation bilaterally, no wheezes or rails Abdominal: Soft.  no distension.  no tenderness.  Musculoskeletal: Normal range of motion Neurological:  normal muscle tone. Coordination normal. No atrophy Skin: Skin warm and dry Psychiatric: normal affect, pleasant  Recent Labs: 02/23/2022: BUN 13; Creatinine, Ser 1.21; Hemoglobin 14.0; Platelets 251; Potassium 3.7; Sodium 134 03/03/2022: ALT 29    Lipid Panel Lab Results  Component Value Date   CHOL 150 10/30/2014   HDL 42.30 10/30/2014   LDLCALC 88 10/30/2014   TRIG 437 (H) 11/09/2015      Wt Readings from Last 3 Encounters:  05/18/22 185 lb (83.9  kg)  04/28/22 185 lb 9.6 oz (84.2 kg)  02/03/22 193 lb (87.5 kg)     ASSESSMENT AND PLAN:  Preop cardiovascular evaluation Acceptable risk for hernia surgery, no further cardiac testing needed  Coronary artery disease with stable angina Currently with no symptoms of angina. No further workup at this time. Continue current medication regimen.  Chronic respiratory distress/ILD followed by pulmonary,  uses oxygen as needed Normal right heart pressures  PAF (paroxysmal atrial fibrillation) (HCC) CHADs VASC of 5  Had a monitor at the [place], was told he did not need to be on Eliquis and this was held by outside cardiology Denies any symptoms for recurrent arrhythmia  Aortic atherosclerosis ([place]) Cholesterol at goal  Stenosis  of left carotid artery History of carotid endarterectomy Scheduled for repeat ultrasound June 02, 2022  type 2 diabetes mellitus with other circulatory complication, without long-term current use of insulin (HCC) A1c 7.0, reports he is watching his diet  S/P CABG x 4 Denies anginal symptoms, prior surgery 2017  PAD Prior carotid endarterectomy on the left, 60 to 79% disease on the right per his recollection.  Ultrasounds previously done at the [facility] Repeat carotid ultrasound scheduled next month  Alcohol abuse Prior history   Total encounter time more than 40 minutes  Greater than 50% was spent in counseling and coordination of care with the patient    Orders Placed This Encounter  Procedures   EKG 12-Lead     Signed, Esmond Plants, M.D., Ph.D. 05/18/2022  [place], [place]

## 2022-05-18 ENCOUNTER — Encounter: Payer: Self-pay | Admitting: Cardiovascular Disease

## 2022-05-18 ENCOUNTER — Ambulatory Visit
Payer: No Typology Code available for payment source | Attending: Cardiovascular Disease | Admitting: Cardiovascular Disease

## 2022-05-18 VITALS — BP 110/54 | HR 67 | Ht 67.0 in | Wt 185.0 lb

## 2022-05-18 DIAGNOSIS — I700 Atherosclerosis of aorta: Secondary | ICD-10-CM

## 2022-05-18 DIAGNOSIS — I25118 Atherosclerotic heart disease of native coronary artery with other forms of angina pectoris: Secondary | ICD-10-CM | POA: Diagnosis not present

## 2022-05-18 DIAGNOSIS — J989 Respiratory disorder, unspecified: Secondary | ICD-10-CM

## 2022-05-18 DIAGNOSIS — I77810 Thoracic aortic ectasia: Secondary | ICD-10-CM

## 2022-05-18 DIAGNOSIS — J849 Interstitial pulmonary disease, unspecified: Secondary | ICD-10-CM

## 2022-05-18 DIAGNOSIS — R0602 Shortness of breath: Secondary | ICD-10-CM

## 2022-05-18 DIAGNOSIS — Z9889 Other specified postprocedural states: Secondary | ICD-10-CM

## 2022-05-18 DIAGNOSIS — I739 Peripheral vascular disease, unspecified: Secondary | ICD-10-CM

## 2022-05-18 DIAGNOSIS — I480 Paroxysmal atrial fibrillation: Secondary | ICD-10-CM | POA: Diagnosis not present

## 2022-05-18 NOTE — Patient Instructions (Signed)
Medication Instructions:  No changes  Nausea medication for pulmonary medication: Zofran or phenergen  If you need a refill on your cardiac medications before your next appointment, please call your pharmacy.   Lab work: No new labs needed  Testing/Procedures: No new testing needed  Follow-Up: At [facility], you and your health needs are our priority.  As part of our continuing mission to provide you with exceptional heart care, we have created designated Provider Care Teams.  These Care Teams include your primary Cardiologist (physician) and Advanced Practice Providers (APPs -  Physician Assistants and Nurse Practitioners) who all work together to provide you with the care you need, when you need it.  You will need a follow up appointment in 12 months  Providers on your designated Care Team:   Murray Hodgkins, NP Christell Faith, PA-C Cadence Kathlen Mody, [place]  COVID-19 Vaccine Information can be found at: [email or web address].uk For questions related to vaccine distribution or appointments, please email [email or web address] or call [phone].

## 2022-05-19 NOTE — Telephone Encounter (Signed)
There is a fair chance side effect can come back but worth rechalleng  1) esbriet 1 pill twice daily x 7 days 2) esbreit 1 pill three times daily x 7 days 3) esbriet 2 pills three times daily x to continue  Slow up-titration Take with food Can do zofran  Let us see if this time he is able to reset and adapt

## 2022-05-20 MED ORDER — ONDANSETRON HCL 4 MG PO TABS: 4.0000 mg | ORAL_TABLET | Freq: Three times a day (TID) | ORAL | 3 refills | Status: DC | PRN

## 2022-05-20 NOTE — Telephone Encounter (Signed)
Zofran '4mg'$  every 8hrs as needed for nausea .

## 2022-05-20 NOTE — Telephone Encounter (Signed)
Per DPR LVM advising patient zofran has been sent to pharmacy. Will leave encounter open in case patient has questions

## 2022-05-20 NOTE — Telephone Encounter (Signed)
Spoke with patient advised him of Dr. Golden Pop recommendations. Patient advises he will restart Esbriet right away.  Dr. Chase Caller wanted patient to have zofran when needed for nausea. Tammy can you please advise what mg I should send in since MR is unavailable   Pharmacy [place]

## 2022-06-02 ENCOUNTER — Ambulatory Visit: Payer: No Typology Code available for payment source | Attending: Cardiovascular Disease

## 2022-06-02 DIAGNOSIS — I6522 Occlusion and stenosis of left carotid artery: Secondary | ICD-10-CM

## 2022-06-05 ENCOUNTER — Ambulatory Visit (INDEPENDENT_AMBULATORY_CARE_PROVIDER_SITE_OTHER): Payer: No Typology Code available for payment source | Admitting: Adult Health

## 2022-06-05 ENCOUNTER — Encounter: Payer: Self-pay | Admitting: Adult Health

## 2022-06-05 VITALS — BP 118/60 | HR 72 | Temp 97.9°F | Ht 66.5 in | Wt 182.8 lb

## 2022-06-05 DIAGNOSIS — J849 Interstitial pulmonary disease, unspecified: Secondary | ICD-10-CM

## 2022-06-05 DIAGNOSIS — J9611 Chronic respiratory failure with hypoxia: Secondary | ICD-10-CM | POA: Diagnosis not present

## 2022-06-05 DIAGNOSIS — Z79899 Other long term (current) drug therapy: Secondary | ICD-10-CM | POA: Diagnosis not present

## 2022-06-05 NOTE — Assessment & Plan Note (Addendum)
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  Patient is on chronic steroids with prednisone 10 mg daily.  Intolerant to [employer].  Patient is now trying to titrate Esbriet.  Unfortunately at the higher doses patient has significant GI side effects with severe nausea.  Will start very slowly titrating Esbriet up.  PFTs show stable lung function compared to September 2023.  Patient O2 demands are stable.  Will have patient follow back up in 6 weeks with repeat labs.  ALT did slightly increase -will repeat LFTs on return visit.  Previously on Imuran.  Will discuss other alternatives on return visit with Dr. Chase Caller  Plan  Patient Instructions  Continue on with Esbriet- very slow increase  (Increase Esbriet 1  Three times a day for 4 weeks,  then 2 pills in am , 1 lunch and 1 at dinner for 2 weeks,  then 2 pills in am , 1 at lunch , and 2 at dinner for 2 weeks and  then 2 in am , 2 at lunch and 2 at dinner-hold at this dose )  Continue on Oxygen 4l/m rest and 6l/m with walking and 2l/m At bedtime   Continue on Prednisone 10mg  daily.  Activity as tolerated. Saline nasal gel Twice daily   Follow up with Dr. Chase Caller or Emrick Hensch NP in 6 weeks with labs  and As needed

## 2022-06-05 NOTE — Patient Instructions (Addendum)
Continue on with Esbriet- very slow increase  (Increase Esbriet 1  Three times a day for 4 weeks,  then 2 pills in am , 1 lunch and 1 at dinner for 2 weeks,  then 2 pills in am , 1 at lunch , and 2 at dinner for 2 weeks and  then 2 in am , 2 at lunch and 2 at dinner-hold at this dose )  Continue on Oxygen 4l/m rest and 6l/m with walking and 2l/m At bedtime   Continue on Prednisone 10mg  daily.  Activity as tolerated. Saline nasal gel Twice daily   Follow up with Dr. Chase Caller or Laneisha Mino NP in 6 weeks with labs  and As needed

## 2022-06-05 NOTE — Progress Notes (Signed)
@Patient  ID: Brian Casey, male    DOB: [birth year], 72 y.o.   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 History of heavy asbestos exposure (occupational)   Has chronic respiratory failure on home oxygen Medical history significant for coronary artery disease status post CABG  TEST/EVENTS :  May 2023: CT - progressive ILD. Worsning GGO and reticulation. Trace left effuion   High-resolution CT chest November 13, 2021 shows widespread groundglass attenuation, septal thickening, subpleural reticulation, bronchiectasis, developing honeycombing, minimum progression but definite progression over earlier scans.  Suggestive of alternative diagnosis   2019 June CT scan of the chest: Read by Dr. Polly Cobia.  Concerning for ILD.  Reticulation also left-sided pleural effusion   May 2021 high-resolution CT chest: Read by Dr. Polly Cobia.  Indeterminate pattern for UIP versus alternate diagnosis.Marland Kitchen  Definite of ILD.  There is subpleural sparing.  Left-sided pleural effusion present.  Findings deemed is unchanged from prior note seventh   Right heart cath 02/03/2022- PAP 61mmHg. -> does not meet criteria for tyvaso    PFTs December 12, 2021 FEV1 at 44%, ratio 89, FVC 36%, no significant bronchodilator response, DLCO is 22%.  Intolerant to Ofev 2023 Esbriet started February 2024  06/05/2022 Follow up : ILD, O2 RF  Patient presents for a 6-week follow-up.  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  He has a history of heavy exposure to asbestos with previous work at [employer].  He has previously tried [employer] but was intolerant.  Patient started on Esbriet the beginning of February 2024.  Patient was tolerating well up until he started Esbriet 2 capsules 3 times daily.  Had significant nausea and decreased  appetite.  Patient was called in Zofran started on a drug holiday.  Patient says his symptoms immediately stopped when he stopped his Esbriet.  He did not have to use any Zofran.  He had no vomiting or diarrhea.  Patient says he is recently restarted back on Esbriet 2 weeks ago.  He is currently on 1 capsule twice daily and is tolerating well.  We discussed going up very slowly on the Esbriet to see if he can tolerate.  Recent lab work showed a slight increase in his ALT at 63.  Patient is on chronic steroids with prednisone 10 mg.  He does have diabetes and is recently been started on Jardiance. Patient had PFTs done May 07, 2022 that showed stable lung function with FEV1 at 43%, ratio 79, FVC 40%, DLCO 24%.  This is similar to September 2023.  Walk test in the office shows stable oxygen demands -require 6 L of oxygen on continuous flow to maintain O2 saturations while walking.  At rest patient says he does not require any oxygen at all cannot sit still without his oxygen on and keep his sats above 90%.  Today in the office O2 saturations at rest was 90% on room air.  As soon as the patient stands up or starts to walk he does require oxygen because his O2 saturations dropped into the 80s.  Patient denies any hemoptysis, chest pain, orthopnea. Patient has completed pulmonary rehab in the past.  Patient says he is not very active at home.  Is somewhat sedentary.  We discussed increasing his activity as tolerated.  Patient does have upcoming surgery at the end  of this month for a umbilical hernia repair.    Allergies  Allergen Reactions   Penicillins Nausea And Vomiting and Other (See Comments)    REACTION: Vomiting Has patient had a PCN reaction causing immediate rash, facial/tongue/throat swelling, SOB or lightheadedness with hypotension: Yes Has patient had a PCN reaction causing severe rash involving mucus membranes or skin necrosis: No Has patient had a PCN reaction that required  hospitalization No Has patient had a PCN reaction occurring within the last 10 years: No If all of the above answers are "NO", then may proceed with Cephalosporin use.   Amlodipine Swelling   Bupropion    Bupropion Hcl     REACTION: Severe constipation, insomnia   Chlorthalidone Other (See Comments)    Other reaction(s): Hyponatremia   Metformin Diarrhea   Nintedanib Diarrhea and Other (See Comments)   Terazosin Other (See Comments)    Immunization History  Administered Date(s) Administered   Fluad Quad(high Dose 65+) 01/13/2022   Influenza Inj Mdck Quad Pf 12/25/2015   Influenza Split 01/26/2011, 02/10/2012   Influenza Whole 01/22/2004, 02/05/2008, 12/26/2008   Influenza,inj,Quad PF,6+ Mos 12/29/2012, 01/10/2014, 12/11/2014   Influenza-Unspecified 12/25/2016, 12/22/2017, 12/21/2018, 12/07/2019, 12/24/2020   PFIZER Comirnaty(Gray Top)Covid-19 Tri-Sucrose Vaccine 04/09/2019, 05/02/2019, 12/28/2019, 01/01/2021   Pneumococcal Conjugate-13 03/29/2014, 11/06/2014, 11/15/2017   Pneumococcal Polysaccharide-23 11/05/2016   RSV,unspecified 02/19/2022   Td 08/22/1998, 08/18/2013   Unspecified SARS-COV-2 Vaccination 02/20/2022   Zoster, Live 03/29/2014    Past Medical History:  Diagnosis Date   Adenomatous colon polyp    Anxiety    Aortic root dilatation ([place])    Noted on echo in August 2023.  Measured 39 mm.   Arthritis    "lower spine; fingers" (11/07/2015)   CAD (coronary artery disease)    a. 10/2015 NSTEMI/Cath: LM 40, LAD 70p, LCX 90ost, RI 90ost, RCA 60-70p, 90-91m/d, 40-50d; b. 10/2015 CABG x 4: LIMA->LAD, VG->Diag, VG->RI, VG->RPDA.   CKD (chronic kidney disease), stage III (HCC)    Depression    Diastolic dysfunction    a. 10/2021 Echo: EF 60-65%,   Diverticulosis    GERD (gastroesophageal reflux disease)    High cholesterol    History of bronchitis    History of Hyperthyroidism    "had it radiated in his '39s"   Hypertension 10/17/2013   Hypothyroidism    ILD  (interstitial lung disease) ([place])    PAD (peripheral artery disease) (HCC)    s/p left carotid endarterectomy   Post-op atrial fibrillation (HCC) 11/06/2015   Shortness of breath    Type II diabetes mellitus ([place])    type II   Wears glasses     Tobacco History: Social History   Tobacco Use  Smoking Status Former   Packs/day: 1.00   Years: 48.00   Additional pack years: 0.00   Total pack years: 48.00   Types: Cigarettes   Quit date: 11/07/2015   Years since quitting: 6.5  Smokeless Tobacco Never   Counseling given: Not Answered   Outpatient Medications Prior to Visit  Medication Sig Dispense Refill   aspirin EC 81 MG tablet Take 81 mg by mouth daily. Swallow whole.     atorvastatin (LIPITOR) 40 MG tablet Take 40 mg by mouth daily.     carvedilol (COREG) 12.5 MG tablet Take 12.5 mg by mouth 2 (two) times daily with a meal.     furosemide (LASIX) 20 MG tablet Take 20 mg by mouth daily.     levothyroxine (SYNTHROID) 100 MCG tablet Take 100  mcg by mouth daily before breakfast.     Loperamide HCl (IMODIUM PO) Take 1 mg by mouth as needed (PT takes as needed).     omeprazole (PRILOSEC) 20 MG capsule Take 1 capsule (20 mg total) by mouth daily. OFFICE VISIT REQUIRED FOR ADDITIONAL REFILLS 30 capsule 0   ondansetron (ZOFRAN) 4 MG tablet Take 1 tablet (4 mg total) by mouth every 8 (eight) hours as needed for nausea or vomiting. 20 tablet 3   Pirfenidone 267 MG TABS Month 1: Take 1 tab three times daily for 7 days, then 2 tabs three times daily for 7 days, then 3 tabs three times daily thereafter. 270 tablet 0   Pirfenidone 267 MG TABS Take 3 tablets (801 mg total) by mouth 3 (three) times daily with meals. Month 2 and onwards 270 tablet 4   potassium chloride (KLOR-CON) 10 MEQ tablet Take 10 mEq by mouth daily.     predniSONE (DELTASONE) 10 MG tablet TAKE 1 TABLET DAILY WITH   BREAKFAST 90 tablet 0   tadalafil (CIALIS) 5 MG tablet Take 1 tablet by mouth as needed.     empagliflozin  (JARDIANCE) 10 MG TABS tablet Take 1 tablet by mouth daily. (Patient not taking: Reported on 06/05/2022)     No facility-administered medications prior to visit.     Review of Systems:   Constitutional:   No  weight loss, night sweats,  Fevers, chills,  +fatigue, or  lassitude.  HEENT:   No headaches,  Difficulty swallowing,  Tooth/dental problems, or  Sore throat,                No sneezing, itching, ear ache, nasal congestion, post nasal drip,   CV:  No chest pain,  Orthopnea, PND, swelling in lower extremities, anasarca, dizziness, palpitations, syncope.   GI  No heartburn, indigestion, abdominal pain, nausea, vomiting, diarrhea, change in bowel habits, loss of appetite, bloody stools.   Resp: .  No chest wall deformity  Skin: no rash or lesions.  GU: no dysuria, change in color of urine, no urgency or frequency.  No flank pain, no hematuria   MS:  No joint pain or swelling.  No decreased range of motion.  No back pain.    Physical Exam  BP 118/60 (BP Location: Left Arm, Cuff Size: Normal)   Pulse 72   Temp 97.9 F (36.6 C) (Temporal)   Ht 5' 6.5" (1.689 m)   Wt 182 lb 12.8 oz (82.9 kg)   SpO2 90%   BMI 29.06 kg/m   GEN: A/Ox3; pleasant , NAD, well nourished    HEENT:  Star/AT,  NOSE-clear, THROAT-clear, no lesions, no postnasal drip or exudate noted.   NECK:  Supple w/ fair ROM; no JVD; normal carotid impulses w/o bruits; no thyromegaly or nodules palpated; no lymphadenopathy.    RESP faint bibasilar crackles . no accessory muscle use, no dullness to percussion  CARD:  RRR, no m/r/g, no peripheral edema, pulses intact, no cyanosis or clubbing.  GI:   Soft & nt; nml bowel sounds; no organomegaly or masses detected.   Musco: Warm bil, no deformities or joint swelling noted.   Neuro: alert, no focal deficits noted.    Skin: Warm, no lesions or rashes    Lab Results:  CBC  ProBNP No results found for: "PROBNP"  Imaging:        Latest Ref Rng &  Units 12/12/2021   10:02 AM 11/11/2015    7:38 AM  PFT Results  FVC-Pre L 1.41  1.20   FVC-Predicted Pre % 35  28   FVC-Post L 1.46  1.35   FVC-Predicted Post % 36  32   Pre FEV1/FVC % % 89  60   Post FEV1/FCV % % 89  56   FEV1-Pre L 1.26  0.72   FEV1-Predicted Pre % 43  22   FEV1-Post L 1.30  0.76   DLCO uncorrected ml/min/mmHg 5.38  8.93   DLCO UNC% % 22  31   DLCO corrected ml/min/mmHg 5.38  10.84   DLCO COR %Predicted % 22  38   DLVA Predicted % 59  119   TLC L 2.23  2.47   TLC % Predicted % 34  38   RV % Predicted % 33  61     No results found for: "NITRICOXIDE"      Assessment & Plan:   ILD (interstitial lung disease) (HCC) 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  Patient is on chronic steroids with prednisone 10 mg daily.  Intolerant to [employer].  Patient is now trying to titrate Esbriet.  Unfortunately at the higher doses patient has significant GI side effects with severe nausea.  Will start very slowly titrating Esbriet up.  PFTs show stable lung function compared to September 2023.  Patient O2 demands are stable.  Will have patient follow back up in 6 weeks with repeat labs.  ALT did slightly increase -will repeat LFTs on return visit.  Previously on Imuran.  Will discuss other alternatives on return visit with Dr. Chase Caller  Plan  Patient Instructions  Continue on with Esbriet- very slow increase  (Increase Esbriet 1  Three times a day for 4 weeks,  then 2 pills in am , 1 lunch and 1 at dinner for 2 weeks,  then 2 pills in am , 1 at lunch , and 2 at dinner for 2 weeks and  then 2 in am , 2 at lunch and 2 at dinner-hold at this dose )  Continue on Oxygen 4l/m rest and 6l/m with walking and 2l/m At bedtime   Continue on Prednisone 10mg  daily.  Activity as tolerated. Saline nasal gel Twice daily   Follow up with Dr. Chase Caller or Thad Osoria NP in 6 weeks with labs  and As needed       Chronic respiratory failure with hypoxia ([place]) Continue on oxygen  to maintain O2 saturations greater than 88 to 90% No increased oxygen demands on walk test today in the office continue on 4 L at rest and 6 L with activity and 2 L at bedtime  Plan Patient Instructions  Continue on with Esbriet- very slow increase  (Increase Esbriet 1  Three times a day for 4 weeks,  then 2 pills in am , 1 lunch and 1 at dinner for 2 weeks,  then 2 pills in am , 1 at lunch , and 2 at dinner for 2 weeks and  then 2 in am , 2 at lunch and 2 at dinner-hold at this dose )  Continue on Oxygen 4l/m rest and 6l/m with walking and 2l/m At bedtime   Continue on Prednisone 10mg  daily.  Activity as tolerated. Saline nasal gel Twice daily   Follow up with Dr. Chase Caller or Danis Pembleton NP in 6 weeks with labs  and As needed      High risk medication use Patient education given on Esbriet.  Will titrate slowly.  Check LFTs on return visit.  Steroid discussion on daily prednisone.  I spent 44  minutes dedicated to the care of this patient on the date of this encounter to include pre-visit review of records, face-to-face time with the patient discussing conditions above, post visit ordering of testing, clinical documentation with the electronic health record, making appropriate referrals as documented, and communicating necessary findings to members of the patients care team.   Rexene Edison, NP 06/05/2022

## 2022-06-05 NOTE — Assessment & Plan Note (Signed)
Continue on oxygen to maintain O2 saturations greater than 88 to 90% No increased oxygen demands on walk test today in the office continue on 4 L at rest and 6 L with activity and 2 L at bedtime  Plan Patient Instructions  Continue on with Esbriet- very slow increase  (Increase Esbriet 1  Three times a day for 4 weeks,  then 2 pills in am , 1 lunch and 1 at dinner for 2 weeks,  then 2 pills in am , 1 at lunch , and 2 at dinner for 2 weeks and  then 2 in am , 2 at lunch and 2 at dinner-hold at this dose )  Continue on Oxygen 4l/m rest and 6l/m with walking and 2l/m At bedtime   Continue on Prednisone 10mg  daily.  Activity as tolerated. Saline nasal gel Twice daily   Follow up with Dr. Chase Caller or Shaiann Mcmanamon NP in 6 weeks with labs  and As needed

## 2022-06-05 NOTE — Assessment & Plan Note (Signed)
Patient education given on Esbriet.  Will titrate slowly.  Check LFTs on return visit.  Steroid discussion on daily prednisone.

## 2022-06-10 NOTE — Pre-Procedure Instructions (Signed)
Surgical Instructions    Your procedure is scheduled on Thursday, March 28th.  Report to [facility] Main Entrance "A" at 08:30 A.M., then check in with the Admitting office.  Call this number if you have problems the morning of surgery:  [phone]  If you have any questions prior to your surgery date call [phone]: Open Monday-Friday 8am-4pm If you experience any cold or flu symptoms such as cough, fever, chills, shortness of breath, etc. between now and your scheduled surgery, please notify us at the above number.     Remember:  Do not eat after midnight the night before your surgery  You may drink clear liquids until 07:30 AM the morning of your surgery.   Clear liquids allowed are: Water, Non-Citrus Juices (without pulp), Carbonated Beverages, Clear Tea, Black Coffee Only (NO MILK, CREAM OR POWDERED CREAMER of any kind), and Gatorade.   Patient Instructions  The night before surgery:  No food after midnight. ONLY clear liquids after midnight   The day of surgery (if you have diabetes): Drink ONE (1) 12 oz G2 given to you in your pre admission testing appointment by 07:30 AM the morning of surgery. Drink in one sitting. Do not sip.  This drink was given to you during your hospital  pre-op appointment visit.  Nothing else to drink after completing the  12 oz bottle of G2.         If you have questions, please contact your surgeon's office.     Take these medicines the morning of surgery with A SIP OF WATER  atorvastatin (LIPITOR)  carvedilol (COREG)  levothyroxine (SYNTHROID)  omeprazole (PRILOSEC)  Pirfenidone  predniSONE (DELTASONE)    If needed: ondansetron [facility])   Follow your surgeon's instructions on when to stop Aspirin.  If no instructions were given by your surgeon then you will need to call the office to get those instructions.     As of today, STOP taking any Aleve, Naproxen, Ibuprofen, Motrin, Advil, Goody's, BC's, all herbal medications, fish  oil, and all vitamins.  WHAT DO I DO ABOUT MY DIABETES MEDICATION?   Hold empagliflozin (JARDIANCE) 72 hours prior to surgery. Last dose 3/24.     HOW TO MANAGE YOUR DIABETES BEFORE AND AFTER SURGERY  Why is it important to control my blood sugar before and after surgery? Improving blood sugar levels before and after surgery helps healing and can limit problems. A way of improving blood sugar control is eating a healthy diet by:  Eating less sugar and carbohydrates  Increasing activity/exercise  Talking with your doctor about reaching your blood sugar goals High blood sugars (greater than 180 mg/dL) can raise your risk of infections and slow your recovery, so you will need to focus on controlling your diabetes during the weeks before surgery. Make sure that the doctor who takes care of your diabetes knows about your planned surgery including the date and location.  How do I manage my blood sugar before surgery? Check your blood sugar at least 4 times a day, starting 2 days before surgery, to make sure that the level is not too high or low.  Check your blood sugar the morning of your surgery when you wake up and every 2 hours until you get to the Short Stay unit.  If your blood sugar is less than 70 mg/dL, you will need to treat for low blood sugar: Do not take insulin. Treat a low blood sugar (less than 70 mg/dL) with  cup of clear  juice (cranberry or apple), 4 glucose tablets, OR glucose gel. Recheck blood sugar in 15 minutes after treatment (to make sure it is greater than 70 mg/dL). If your blood sugar is not greater than 70 mg/dL on recheck, call [phone] for further instructions. Report your blood sugar to the short stay nurse when you get to Short Stay.  If you are admitted to the hospital after surgery: Your blood sugar will be checked by the staff and you will probably be given insulin after surgery (instead of oral diabetes medicines) to make sure you have good blood  sugar levels. The goal for blood sugar control after surgery is 80-180 mg/dL.                     Do NOT Smoke (Tobacco/Vaping) for 24 hours prior to your procedure.  If you use a CPAP at night, you may bring your mask/headgear for your overnight stay.   Contacts, glasses, piercing's, hearing aid's, dentures or partials may not be worn into surgery, please bring cases for these belongings.    For patients admitted to the hospital, discharge time will be determined by your treatment team.   Patients discharged the day of surgery will not be allowed to drive home, and someone needs to stay with them for 24 hours.  SURGICAL WAITING ROOM VISITATION Patients having surgery or a procedure may have no more than 2 support people in the waiting area - these visitors may rotate.   Children under the age of 58 must have an adult with them who is not the patient. If the patient needs to stay at the hospital during part of their recovery, the visitor guidelines for inpatient rooms apply. Pre-op nurse will coordinate an appropriate time for 1 support person to accompany patient in pre-op.  This support person may not rotate.   Please refer to the [facility] website for the visitor guidelines for Inpatients (after your surgery is over and you are in a regular room).    Special instructions:   [place]- Preparing For Surgery  Before surgery, you can play an important role. Because skin is not sterile, your skin needs to be as free of germs as possible. You can reduce the number of germs on your skin by washing with CHG (chlorahexidine gluconate) Soap before surgery.  CHG is an antiseptic cleaner which kills germs and bonds with the skin to continue killing germs even after washing.    Oral Hygiene is also important to reduce your risk of infection.  Remember - BRUSH YOUR TEETH THE MORNING OF SURGERY WITH YOUR REGULAR TOOTHPASTE  Please do not use if you have an allergy to CHG or antibacterial soaps.  If your skin becomes reddened/irritated stop using the CHG.  Do not shave (including legs and underarms) for at least 48 hours prior to first CHG shower. It is OK to shave your face.  Please follow these instructions carefully.   Shower the NIGHT BEFORE SURGERY and the MORNING OF SURGERY  If you chose to wash your hair, wash your hair first as usual with your normal shampoo.  After you shampoo, rinse your hair and body thoroughly to remove the shampoo.  Use CHG Soap as you would any other liquid soap. You can apply CHG directly to the skin and wash gently with a scrungie or a clean washcloth.   Apply the CHG Soap to your body ONLY FROM THE NECK DOWN.  Do not use on open wounds or open  sores. Avoid contact with your eyes, ears, mouth and genitals (private parts). Wash Face and genitals (private parts)  with your normal soap.   Wash thoroughly, paying special attention to the area where your surgery will be performed.  Thoroughly rinse your body with warm water from the neck down.  DO NOT shower/wash with your normal soap after using and rinsing off the CHG Soap.  Pat yourself dry with a CLEAN TOWEL.  Wear CLEAN PAJAMAS to bed the night before surgery  Place CLEAN SHEETS on your bed the night before your surgery  DO NOT SLEEP WITH PETS.   Day of Surgery: Take a shower with CHG soap. Do not wear jewelry  Do not wear lotions, powders, colognes, or deodorant.  Men may shave face and neck. Do not bring valuables to the hospital. [facility] is not responsible for any belongings or valuables.  Wear Clean/Comfortable clothing the morning of surgery Remember to brush your teeth WITH YOUR REGULAR TOOTHPASTE.   Please read over the following fact sheets that you were given.    If you received a COVID test during your pre-op visit  it is requested that you wear a mask when out in public, stay away from anyone that may not be feeling well and notify your surgeon if you develop  symptoms. If you have been in contact with anyone that has tested positive in the last 10 days please notify you surgeon.

## 2022-06-11 ENCOUNTER — Encounter (HOSPITAL_COMMUNITY): Payer: Self-pay

## 2022-06-11 ENCOUNTER — Encounter (HOSPITAL_COMMUNITY)
Admission: RE | Admit: 2022-06-11 | Discharge: 2022-06-11 | Disposition: A | Discharge status: Home / Self Care | Payer: No Typology Code available for payment source | Source: Ambulatory Visit | Attending: Surgery | Admitting: Surgery

## 2022-06-11 ENCOUNTER — Other Ambulatory Visit: Payer: Self-pay

## 2022-06-11 VITALS — BP 134/63 | HR 80 | Temp 97.5°F | Ht 66.0 in | Wt 179.7 lb

## 2022-06-11 DIAGNOSIS — I2510 Atherosclerotic heart disease of native coronary artery without angina pectoris: Secondary | ICD-10-CM | POA: Insufficient documentation

## 2022-06-11 DIAGNOSIS — I272 Pulmonary hypertension, unspecified: Secondary | ICD-10-CM | POA: Insufficient documentation

## 2022-06-11 DIAGNOSIS — Z9981 Dependence on supplemental oxygen: Secondary | ICD-10-CM | POA: Insufficient documentation

## 2022-06-11 DIAGNOSIS — K760 Fatty (change of) liver, not elsewhere classified: Secondary | ICD-10-CM | POA: Diagnosis not present

## 2022-06-11 DIAGNOSIS — I10 Essential (primary) hypertension: Secondary | ICD-10-CM

## 2022-06-11 DIAGNOSIS — Z951 Presence of aortocoronary bypass graft: Secondary | ICD-10-CM | POA: Diagnosis not present

## 2022-06-11 DIAGNOSIS — J849 Interstitial pulmonary disease, unspecified: Secondary | ICD-10-CM | POA: Diagnosis not present

## 2022-06-11 DIAGNOSIS — I700 Atherosclerosis of aorta: Secondary | ICD-10-CM | POA: Insufficient documentation

## 2022-06-11 DIAGNOSIS — N183 Chronic kidney disease, stage 3 unspecified: Secondary | ICD-10-CM | POA: Diagnosis not present

## 2022-06-11 DIAGNOSIS — E7800 Pure hypercholesterolemia, unspecified: Secondary | ICD-10-CM | POA: Diagnosis not present

## 2022-06-11 DIAGNOSIS — T83728A Exposure of other implanted mesh and other prosthetic materials to surrounding organ or tissue, initial encounter: Secondary | ICD-10-CM | POA: Diagnosis not present

## 2022-06-11 DIAGNOSIS — I129 Hypertensive chronic kidney disease with stage 1 through stage 4 chronic kidney disease, or unspecified chronic kidney disease: Secondary | ICD-10-CM | POA: Insufficient documentation

## 2022-06-11 DIAGNOSIS — E119 Type 2 diabetes mellitus without complications: Secondary | ICD-10-CM

## 2022-06-11 DIAGNOSIS — K429 Umbilical hernia without obstruction or gangrene: Secondary | ICD-10-CM | POA: Diagnosis not present

## 2022-06-11 DIAGNOSIS — I480 Paroxysmal atrial fibrillation: Secondary | ICD-10-CM | POA: Insufficient documentation

## 2022-06-11 DIAGNOSIS — Y838 Other surgical procedures as the cause of abnormal reaction of the patient, or of later complication, without mention of misadventure at the time of the procedure: Secondary | ICD-10-CM | POA: Insufficient documentation

## 2022-06-11 DIAGNOSIS — Z01812 Encounter for preprocedural laboratory examination: Secondary | ICD-10-CM | POA: Insufficient documentation

## 2022-06-11 DIAGNOSIS — E1122 Type 2 diabetes mellitus with diabetic chronic kidney disease: Secondary | ICD-10-CM | POA: Insufficient documentation

## 2022-06-11 LAB — COMPREHENSIVE METABOLIC PANEL
ALT: 46 U/L — ABNORMAL HIGH (ref 0–44)
AST: 31 U/L (ref 15–41)
Albumin: 3.7 g/dL (ref 3.5–5.0)
Alkaline Phosphatase: 72 U/L (ref 38–126)
Anion gap: 13 (ref 5–15)
BUN: 17 mg/dL (ref 8–23)
CO2: 25 mmol/L (ref 22–32)
Calcium: 8.9 mg/dL (ref 8.9–10.3)
Chloride: 93 mmol/L — ABNORMAL LOW (ref 98–111)
Creatinine, Ser: 1.4 mg/dL — ABNORMAL HIGH (ref 0.61–1.24)
GFR, Estimated: 54 mL/min — ABNORMAL LOW (ref >60)
Glucose, Bld: 333 mg/dL — ABNORMAL HIGH (ref 70–99)
Potassium: 4.3 mmol/L (ref 3.5–5.1)
Sodium: 131 mmol/L — ABNORMAL LOW (ref 135–145)
Total Bilirubin: 0.8 mg/dL (ref 0.3–1.2)
Total Protein: 7 g/dL (ref 6.5–8.1)

## 2022-06-11 LAB — CBC
HCT: 42.9 % (ref 39.0–52.0)
Hemoglobin: 15.3 g/dL (ref 13.0–17.0)
MCH: 34.2 pg — ABNORMAL HIGH (ref 26.0–34.0)
MCHC: 35.7 g/dL (ref 30.0–36.0)
MCV: 96 fL (ref 80.0–100.0)
Platelets: 203 10*3/uL (ref 150–400)
RBC: 4.47 MIL/uL (ref 4.22–5.81)
RDW: 12.5 % (ref 11.5–15.5)
WBC: 11 10*3/uL — ABNORMAL HIGH (ref 4.0–10.5)
nRBC: 0 % (ref 0.0–0.2)

## 2022-06-11 LAB — GLUCOSE, CAPILLARY: Glucose-Capillary: 431 mg/dL — ABNORMAL HIGH (ref 70–99)

## 2022-06-11 NOTE — Progress Notes (Addendum)
PCP - Dr. [name] Cardiologist - Dr. Rockey Situ Pulmonology:. Dr. Tressie Stalker  PPM/ICD - Denies  Chest x-ray -  EKG - 05/18/2022 Stress Test - 10/2015 ECHO - 11/12/2021 Cardiac Cath - 11/06/2015  Sleep Study - Denies. Exertional SOB, wears oxygen 4L/[place], 6L/[place] while ambulating CPAP - Denies  Type II Diabetes Fasting Blood Sugar - 140-150 Checks Blood Sugar one time a day.  Blood sugar during PAT appointment 431.  Patient is approx. 2 hours after eating lunch and having watermelon and crackers.  States his sugars have been high since he has been on prednisone.  Myra Gianotti, PA-C made aware.   Last dose of GLP1 agonist-  Denies GLP1 instructions: Denies  Blood Thinner Instructions:Denies Aspirin Instructions:Stop 5 days prior to surgery.   ERAS Protcol -Yes PRE-SURGERY G2-   COVID TEST- Denies   Anesthesia review: Yes. ILD on home oxygen, has pulmonologist, heart history, open heart.   Patient denies shortness of breath, fever, cough and chest pain at PAT appointment   All instructions explained to the patient, with a verbal understanding of the material. Patient agrees to go over the instructions while at home for a better understanding. Patient also instructed to self quarantine after being tested for COVID-19. The opportunity to ask questions was provided.

## 2022-06-12 NOTE — Progress Notes (Signed)
Anesthesia Chart Review:  Case: [id number] Date/Time: 06/18/22 1015   Procedures:      EXCISION OF MESH     OPEN UMBILICAL HERNIA REPAIR WITH MESH   Anesthesia type: General   Pre-op diagnosis: RECURRENT UMBILICAL HERNIA WITH EXTRUDED MESH   Location: [place] OR ROOM 09 / [place] [facility]   Surgeons: Donnie Mesa, MD       DISCUSSION: Patient is a 72 year old male scheduled for the above procedure.   History includes former smoker (11/07/15), CAD (s/p CABG: LIMA-LAD, SVG-DIAG, SVG-Ramus INT, SVG-PDA 11/14/15), hypercholesterolemia, HTN, PAF (11/04/15), CKD (stage III), hyperthyroidism (s/p RAI > 30 years ago), hypothyroidism, DM2, ILD, diastolic dysfunction, dyspnea, aortic root dilatation, carotid artery stenosis (left carotid endarterectomy 11/14/15), hernia ([facility] 11/16/13), cholecystectomy (01/17/16).  CAD    Home O2 4L/[place], 6L/[place] while ambulating   Glucose 400's. Had recently eaten. Jardiance recently added.  Reported instructions to hold ASA 5 days prior to surgery.   VS: BP 134/63   Pulse 80   Temp (!) 36.4 C   Ht 5\' 6"  (1.676 m)   Wt 81.5 kg   SpO2 90%   BMI 29.00 kg/m   PROVIDERS: [name], MD is PCP  Ida Rogue, MD is cardiologist Brand Males, is pulmonologist Servando Snare, MD is vascular surgeon   LABS: {CHL AN LABS REVIEWED:112001::"Labs reviewed: Acceptable for surgery."} (all labs ordered are listed, but only abnormal results are displayed)  Labs Reviewed  GLUCOSE, CAPILLARY - Abnormal; Notable for the following components:      Result Value   Glucose-Capillary 431 (*)    All other components within normal limits  COMPREHENSIVE METABOLIC PANEL - Abnormal; Notable for the following components:   Sodium 131 (*)    Chloride 93 (*)    Glucose, Bld 333 (*)    Creatinine, Ser 1.40 (*)    ALT 46 (*)    GFR, Estimated 54 (*)    All other components within normal limits  CBC - Abnormal; Notable for the following components:   WBC 11.0 (*)    MCH 34.2 (*)     All other components within normal limits    PFTs 05/07/22: FVC 3.93 (40%). FEV1 2.88 (43%). FEV1/FVC 74%.   IMAGES: CT Abd 12/09/21: IMPRESSION: 1. Mild diffuse bowel wall thickening of the sigmoid colon. There is bowel wall thickening of a segment of small bowel loop in the posterior midabdomen. The findings are nonspecific but can be seen in infectious or inflammatory etiology. 2. Fatty infiltration of liver. 3. Patchy ground-glass opacity and increased pulmonary markings are identified throughout bilateral lung bases more prominently involving the right lung base compared to left. This is nonspecific but can be seen in infectious/inflammatory etiology. - Aortic Atherosclerosis (ICD10-I70.0).  CT Chest 11/13/21: IMPRESSION: 1. The appearance of the lungs is indicative of interstitial lung disease, with minimal progression compared to the most recent prior study, but definitive progression when compared to more remote prior examinations. 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 2. Aortic atherosclerosis, in addition to left main and three-vessel coronary artery disease. Status post median sternotomy for CABG including LIMA to the LAD.    EKG: 05/18/22:  Sinus rhythm with 1st AV block Possible left atrial enlargement   CV: US Carotid 06/02/22: Summary:  - Right Carotid: Velocities in the right ICA are consistent with a 40-59% stenosis. Non-hemodynamically significant plaque <50% noted in the CCA.  The ECA appears <50% stenosed.  - Left Carotid: Velocities in the left ICA are consistent with a 1-39% stenosis.  Non-hemodynamically significant plaque <50% noted in the CCA. The ECA appears >50% stenosed.  - Vertebrals:  Left vertebral artery demonstrates antegrade flow. Right vertebral artery demonstrates bidirectional flow.  -  Subclavians: Normal flow hemodynamics were seen in bilateral subclavian arteries.   [place] 02/03/22: Conclusions: Normal left and right heart filling pressures. Mild pulmonary hypertension (mean PAP 21 mmHg). Normal Fick cardiac output and low normal to mildly reduced thermodilution cardiac output. Recommendations: Ongoing medical therapy per Drs. Gollan and Ramaswamy.  Echo 11/12/21: IMPRESSIONS   1. Left ventricular ejection fraction, by estimation, is 60 to 65%. The  left ventricle has normal function. The left ventricle has no regional  wall motion abnormalities. There is mild left ventricular hypertrophy.  Left ventricular diastolic parameters  are consistent with Grade I diastolic dysfunction (impaired relaxation).   2. Right ventricular systolic function is normal. The right ventricular  size is normal.   3. The mitral valve is normal in structure. No evidence of mitral valve  regurgitation.   4. The aortic valve is tricuspid. Aortic valve regurgitation is not  visualized. Aortic valve sclerosis is present, with no evidence of aortic  valve stenosis.   5. Aortic dilatation noted. There is mild dilatation of the aortic root,  measuring 39 mm.   6. The inferior vena cava is normal in size with greater than 50%  respiratory variability, suggesting right atrial pressure of 3 mmHg.    LHC 11/06/15 (PRE-CABG): Dist RCA lesion, 95 %stenosed. Ramus lesion, 90 %stenosed. Ost Cx lesion, 95 %stenosed. Mid LAD lesion, 70 %stenosed. Dist LAD lesion, 60 %stenosed. Ost LM to LM lesion, 40 %stenosed.    Past Medical History:  Diagnosis Date   Adenomatous colon polyp    Anxiety    Aortic root dilatation ([place])    Noted on echo in August 2023.  Measured 39 mm.   Arthritis    "lower spine; fingers" (11/07/2015)   CAD (coronary artery disease)    a. 10/2015 NSTEMI/Cath: LM 40, LAD 70p, LCX 90ost, RI 90ost, RCA 60-70p, 90-56m/d, 40-50d; b. 10/2015 CABG x 4: LIMA->LAD, VG->Diag, VG->RI,  VG->RPDA.   CKD (chronic kidney disease), stage III (HCC)    Depression    Diastolic dysfunction    a. 10/2021 Echo: EF 60-65%,   Diverticulosis    GERD (gastroesophageal reflux disease)    High cholesterol    History of bronchitis    History of Hyperthyroidism    "had it radiated in his '39s"   Hypertension 10/17/2013   Hypothyroidism    ILD (interstitial lung disease) ([place])    PAD (peripheral artery disease) (HCC)    s/p left carotid endarterectomy   Post-op atrial fibrillation (HCC) 11/06/2015   Shortness of breath    Type II diabetes mellitus ([place])    type II   Wears glasses     Past Surgical History:  Procedure Laterality Date   CARDIAC CATHETERIZATION Right 11/06/2015   Procedure: Left Heart Cath and Coronary Angiography;  Surgeon: Minna Merritts, MD;  Location: [facility];  Service: Cardiovascular;  Laterality: Right;   CHOLECYSTECTOMY N/A 01/17/2016   Procedure: LAPAROSCOPIC CHOLECYSTECTOMY WITH INTRAOPERATIVE CHOLANGIOGRAM;  Surgeon: Fanny Skates, MD;  Location: [facility];  Service: General;  Laterality: N/A;   COLONOSCOPY     COLONOSCOPY N/A 11/19/2021   Procedure: COLONOSCOPY;  Surgeon: Toledo, Benay Pike, MD;  Location: [facility];  Service: Gastroenterology;  Laterality: N/A;   CORONARY ARTERY BYPASS GRAFT N/A 11/14/2015   Procedure: CORONARY ARTERY BYPASS GRAFTING (CABG) x 4;  Surgeon: Ivin Poot, MD;  Location: [facility];  Service: Open Heart Surgery;  Laterality: N/A;   ENDARTERECTOMY Left 11/14/2015   Procedure: LEFT ENDARTERECTOMY CAROTID;  Surgeon: Waynetta Sandy, MD;  Location: [facility];  Service: Vascular;  Laterality: Left;   ESOPHAGOGASTRODUODENOSCOPY N/A 11/19/2021   Procedure: ESOPHAGOGASTRODUODENOSCOPY (EGD);  Surgeon: Toledo, Benay Pike, MD;  Location: [facility];  Service: Gastroenterology;  Laterality: N/A;   FINGER GANGLION CYST EXCISION Left X 3   "index finger X 2; thumb X 1"   [place] OF MESH N/A 11/16/2013   Procedure: INSERTION OF MESH;  Surgeon: Imogene Burn. Georgette Dover, MD;  Location: [place] [facility];  Service: General;  Laterality: N/A;   IR GENERIC HISTORICAL  11/08/2015   IR PERC CHOLECYSTOSTOMY 11/08/2015 Corrie Mckusick, DO [facility]   IR GENERIC HISTORICAL  12/13/2015   IR CHOLANGIOGRAM EXISTING TUBE 12/13/2015 [facility]   IR GENERIC HISTORICAL  12/25/2015   IR CATHETER TUBE CHANGE 12/25/2015 Sandi Mariscal, MD [facility]   IR GENERIC HISTORICAL  12/12/2015   IR RADIOLOGIST EVAL & MGMT 12/12/2015 Greggory Keen, MD [facility]   RIGHT HEART CATH Right 02/03/2022   Procedure: RIGHT HEART CATH;  Surgeon: Nelva Bush, MD;  Location: [facility];  Service: Cardiovascular;  Laterality: Right;   TEE WITHOUT CARDIOVERSION N/A 11/14/2015   Procedure: TRANSESOPHAGEAL ECHOCARDIOGRAM (TEE);  Surgeon: Ivin Poot, MD;  Location: [facility];  Service: Open Heart Surgery;  Laterality: N/A;   UMBILICAL HERNIA REPAIR N/A 11/16/2013   Procedure: UMBILICAL HERNIA REPAIR;  Surgeon: Imogene Burn. Georgette Dover, MD;  Location: [facility];  Service: General;  Laterality: N/A;   VEIN HARVEST Right 11/14/2015   Procedure: RIGHT LEG GREATER SAPHENOUS VEIN HARVEST;  Surgeon: Ivin Poot, MD;  Location: [facility];  Service: Open Heart Surgery;  Laterality: Right;    MEDICATIONS:  aspirin EC 81 MG tablet   atorvastatin (LIPITOR) 40 MG tablet   carvedilol (COREG) 12.5 MG tablet   empagliflozin (JARDIANCE) 10 MG TABS tablet   furosemide (LASIX) 20 MG tablet   levothyroxine (SYNTHROID) 100 MCG tablet   loperamide (IMODIUM) 2 MG capsule   omeprazole (PRILOSEC) 20 MG capsule   ondansetron (ZOFRAN) 4 MG tablet   Pirfenidone 267 MG TABS   Pirfenidone 267 MG TABS   potassium chloride (KLOR-CON) 10 MEQ tablet   predniSONE (DELTASONE) 10 MG tablet   tadalafil (CIALIS) 10 MG tablet   No current facility-administered medications for this encounter.

## 2022-06-14 NOTE — Anesthesia Preprocedure Evaluation (Addendum)
Anesthesia Evaluation  Patient identified by MRN, date of birth, ID band Patient awake    Reviewed: Allergy & Precautions, NPO status , Patient's Chart, lab work & pertinent test results  History of Anesthesia Complications Negative for: history of anesthetic complications  Airway Mallampati: III  TM Distance: >3 FB Neck ROM: Full    Dental  (+) Dental Advisory Given   Pulmonary shortness of breath, at rest and Long-Term Oxygen Therapy, former smoker ILD with 5L O2 per min    + decreased breath sounds      Cardiovascular hypertension, Pt. on medications and Pt. on home beta blockers + angina  + CAD, + Past MI and + Peripheral Vascular Disease   Rhythm:Regular   1. Left ventricular ejection fraction, by estimation, is 60 to 65%. The  left ventricle has normal function. The left ventricle has no regional  wall motion abnormalities. There is mild left ventricular hypertrophy.  Left ventricular diastolic parameters  are consistent with Grade I diastolic dysfunction (impaired relaxation).   2. Right ventricular systolic function is normal. The right ventricular  size is normal.   3. The mitral valve is normal in structure. No evidence of mitral valve  regurgitation.   4. The aortic valve is tricuspid. Aortic valve regurgitation is not  visualized. Aortic valve sclerosis is present, with no evidence of aortic  valve stenosis.   5. Aortic dilatation noted. There is mild dilatation of the aortic root,  measuring 39 mm.   6. The inferior vena cava is normal in size with greater than 50%  respiratory variability, suggesting right atrial pressure of 3 mmHg.     Neuro/Psych  PSYCHIATRIC DISORDERS Anxiety Depression     Neuromuscular disease    GI/Hepatic ,GERD  ,,  Endo/Other  diabetesHypothyroidism    Renal/GU CRFRenal diseaseLab Results      Component                Value               Date                      CREATININE                1.40 (H)            06/11/2022                Musculoskeletal  (+) Arthritis ,    Abdominal   Peds  Hematology negative hematology ROS (+) Lab Results      Component                Value               Date                      WBC                      11.0 (H)            06/11/2022                HGB                      15.3                06/11/2022                HCT  42.9                06/11/2022                MCV                      96.0                06/11/2022                PLT                      203                 06/11/2022              Anesthesia Other Findings   Reproductive/Obstetrics                             Anesthesia Physical Anesthesia Plan  ASA: 4  Anesthesia Plan: General   Post-op Pain Management: Ofirmev IV (intra-op)*   Induction: Intravenous  PONV Risk Score and Plan: 2 and Ondansetron and Dexamethasone  Airway Management Planned: Oral ETT  Additional Equipment: None  Intra-op Plan:   Post-operative Plan:   Informed Consent: I have reviewed the patients History and Physical, chart, labs and discussed the procedure including the risks, benefits and alternatives for the proposed anesthesia with the patient or authorized representative who has indicated his/her understanding and acceptance.     Dental advisory given  Plan Discussed with: CRNA  Anesthesia Plan Comments: (See PAT note written 06/14/2022 by Myra Gianotti, PA-C. He has preoperative cardiology and pulmonology input. Dr. Chase Caller classified his pulmonary risk as moderate to high and recommended post-operative ICU stay.  )       Anesthesia Quick Evaluation

## 2022-06-18 ENCOUNTER — Other Ambulatory Visit: Payer: Self-pay

## 2022-06-18 ENCOUNTER — Ambulatory Visit (HOSPITAL_COMMUNITY): Payer: No Typology Code available for payment source | Admitting: Vascular Surgery

## 2022-06-18 ENCOUNTER — Encounter (HOSPITAL_COMMUNITY)
Admission: RE | Disposition: A | Discharge status: Home / Self Care | Payer: Self-pay | Source: Home / Self Care | Attending: Surgery

## 2022-06-18 ENCOUNTER — Ambulatory Visit (HOSPITAL_COMMUNITY)
Admission: RE | Admit: 2022-06-18 | Discharge: 2022-06-18 | Disposition: A | Discharge status: Home / Self Care | Payer: No Typology Code available for payment source | Attending: Surgery | Admitting: Surgery

## 2022-06-18 ENCOUNTER — Ambulatory Visit (HOSPITAL_BASED_OUTPATIENT_CLINIC_OR_DEPARTMENT_OTHER): Payer: No Typology Code available for payment source

## 2022-06-18 ENCOUNTER — Encounter (HOSPITAL_COMMUNITY): Payer: Self-pay | Admitting: Surgery

## 2022-06-18 DIAGNOSIS — Z951 Presence of aortocoronary bypass graft: Secondary | ICD-10-CM | POA: Insufficient documentation

## 2022-06-18 DIAGNOSIS — Z7952 Long term (current) use of systemic steroids: Secondary | ICD-10-CM | POA: Diagnosis not present

## 2022-06-18 DIAGNOSIS — I4891 Unspecified atrial fibrillation: Secondary | ICD-10-CM | POA: Diagnosis not present

## 2022-06-18 DIAGNOSIS — X58XXXA Exposure to other specified factors, initial encounter: Secondary | ICD-10-CM | POA: Diagnosis not present

## 2022-06-18 DIAGNOSIS — K429 Umbilical hernia without obstruction or gangrene: Secondary | ICD-10-CM

## 2022-06-18 DIAGNOSIS — J984 Other disorders of lung: Secondary | ICD-10-CM | POA: Insufficient documentation

## 2022-06-18 DIAGNOSIS — N189 Chronic kidney disease, unspecified: Secondary | ICD-10-CM

## 2022-06-18 DIAGNOSIS — R053 Chronic cough: Secondary | ICD-10-CM | POA: Diagnosis not present

## 2022-06-18 DIAGNOSIS — N182 Chronic kidney disease, stage 2 (mild): Secondary | ICD-10-CM | POA: Diagnosis not present

## 2022-06-18 DIAGNOSIS — J8417 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: Secondary | ICD-10-CM | POA: Insufficient documentation

## 2022-06-18 DIAGNOSIS — Z9889 Other specified postprocedural states: Secondary | ICD-10-CM | POA: Insufficient documentation

## 2022-06-18 DIAGNOSIS — T8189XA Other complications of procedures, not elsewhere classified, initial encounter: Secondary | ICD-10-CM | POA: Insufficient documentation

## 2022-06-18 DIAGNOSIS — E1122 Type 2 diabetes mellitus with diabetic chronic kidney disease: Secondary | ICD-10-CM

## 2022-06-18 DIAGNOSIS — K4090 Unilateral inguinal hernia, without obstruction or gangrene, not specified as recurrent: Secondary | ICD-10-CM | POA: Insufficient documentation

## 2022-06-18 DIAGNOSIS — I25119 Atherosclerotic heart disease of native coronary artery with unspecified angina pectoris: Secondary | ICD-10-CM

## 2022-06-18 DIAGNOSIS — I129 Hypertensive chronic kidney disease with stage 1 through stage 4 chronic kidney disease, or unspecified chronic kidney disease: Secondary | ICD-10-CM | POA: Diagnosis not present

## 2022-06-18 DIAGNOSIS — Z87891 Personal history of nicotine dependence: Secondary | ICD-10-CM

## 2022-06-18 DIAGNOSIS — E119 Type 2 diabetes mellitus without complications: Secondary | ICD-10-CM

## 2022-06-18 HISTORY — PX: EXCISION OF MESH: SHX6268

## 2022-06-18 HISTORY — PX: UMBILICAL HERNIA REPAIR: SHX196

## 2022-06-18 LAB — GLUCOSE, CAPILLARY
Glucose-Capillary: 270 mg/dL — ABNORMAL HIGH (ref 70–99)
Glucose-Capillary: 284 mg/dL — ABNORMAL HIGH (ref 70–99)
Glucose-Capillary: 240 mg/dL — ABNORMAL HIGH (ref 70–99)

## 2022-06-18 SURGERY — REMOVAL, MESH, ABDOMEN OR PELVIS
Anesthesia: General | Site: Abdomen

## 2022-06-18 MED ORDER — FENTANYL CITRATE (PF) 100 MCG/2ML IJ SOLN
INTRAMUSCULAR | Status: AC
  Filled 2022-06-18: qty 2

## 2022-06-18 MED ORDER — ONDANSETRON HCL 4 MG/2ML IJ SOLN
INTRAMUSCULAR | Status: DC | PRN
  Administered 2022-06-18: 4 mg via INTRAVENOUS

## 2022-06-18 MED ORDER — INSULIN ASPART 100 UNIT/ML IJ SOLN
INTRAMUSCULAR | Status: DC | PRN
  Administered 2022-06-18: 8 [IU] via SUBCUTANEOUS

## 2022-06-18 MED ORDER — ACETAMINOPHEN 500 MG PO TABS: 1000.0000 mg | ORAL_TABLET | Freq: Once | ORAL | Status: DC | PRN

## 2022-06-18 MED ORDER — ACETAMINOPHEN 500 MG PO TABS
1000.0000 mg | ORAL_TABLET | ORAL | Status: AC
  Administered 2022-06-18: 1000 mg via ORAL
  Filled 2022-06-18: qty 2

## 2022-06-18 MED ORDER — FENTANYL CITRATE (PF) 250 MCG/5ML IJ SOLN
INTRAMUSCULAR | Status: DC | PRN
  Administered 2022-06-18: 100 ug via INTRAVENOUS

## 2022-06-18 MED ORDER — VANCOMYCIN HCL IN DEXTROSE 1-5 GM/200ML-% IV SOLN
1000.0000 mg | INTRAVENOUS | Status: AC
  Administered 2022-06-18: 1000 mg via INTRAVENOUS
  Filled 2022-06-18: qty 200

## 2022-06-18 MED ORDER — ACETAMINOPHEN 160 MG/5ML PO SOLN: 1000.0000 mg | Freq: Once | ORAL | Status: DC | PRN

## 2022-06-18 MED ORDER — FENTANYL CITRATE (PF) 250 MCG/5ML IJ SOLN
INTRAMUSCULAR | Status: AC
  Filled 2022-06-18: qty 5

## 2022-06-18 MED ORDER — OXYCODONE HCL 5 MG PO TABS: 5.0000 mg | ORAL_TABLET | Freq: Four times a day (QID) | ORAL | 0 refills | Status: DC | PRN

## 2022-06-18 MED ORDER — PROPOFOL 10 MG/ML IV BOLUS
INTRAVENOUS | Status: AC
  Filled 2022-06-18: qty 20

## 2022-06-18 MED ORDER — 0.9 % SODIUM CHLORIDE (POUR BTL) OPTIME
TOPICAL | Status: DC | PRN
  Administered 2022-06-18: 1000 mL

## 2022-06-18 MED ORDER — ACETAMINOPHEN 10 MG/ML IV SOLN: 1000.0000 mg | Freq: Once | INTRAVENOUS | Status: DC | PRN

## 2022-06-18 MED ORDER — BUPIVACAINE HCL (PF) 0.25 % IJ SOLN
INTRAMUSCULAR | Status: AC
  Filled 2022-06-18: qty 30

## 2022-06-18 MED ORDER — ROCURONIUM BROMIDE 10 MG/ML (PF) SYRINGE
PREFILLED_SYRINGE | INTRAVENOUS | Status: DC | PRN
  Administered 2022-06-18: 70 mg via INTRAVENOUS

## 2022-06-18 MED ORDER — ORAL CARE MOUTH RINSE: 15.0000 mL | Freq: Once | OROMUCOSAL | Status: AC

## 2022-06-18 MED ORDER — OXYCODONE HCL 5 MG/5ML PO SOLN: 5.0000 mg | Freq: Once | ORAL | Status: AC | PRN

## 2022-06-18 MED ORDER — LIDOCAINE 2% (20 MG/ML) 5 ML SYRINGE
INTRAMUSCULAR | Status: DC | PRN
  Administered 2022-06-18: 60 mg via INTRAVENOUS

## 2022-06-18 MED ORDER — INSULIN ASPART 100 UNIT/ML IJ SOLN
INTRAMUSCULAR | Status: AC
  Administered 2022-06-18: 6 [IU] via SUBCUTANEOUS
  Filled 2022-06-18: qty 1

## 2022-06-18 MED ORDER — OXYCODONE HCL 5 MG PO TABS
ORAL_TABLET | ORAL | Status: AC
  Filled 2022-06-18: qty 1

## 2022-06-18 MED ORDER — FENTANYL CITRATE (PF) 100 MCG/2ML IJ SOLN
25.0000 ug | INTRAMUSCULAR | Status: DC | PRN
  Administered 2022-06-18: 25 ug via INTRAVENOUS

## 2022-06-18 MED ORDER — CHLORHEXIDINE GLUCONATE 0.12 % MT SOLN
15.0000 mL | Freq: Once | OROMUCOSAL | Status: AC
  Administered 2022-06-18: 15 mL via OROMUCOSAL
  Filled 2022-06-18: qty 15

## 2022-06-18 MED ORDER — BUPIVACAINE-EPINEPHRINE 0.25% -1:200000 IJ SOLN: INTRAMUSCULAR | Status: DC | PRN

## 2022-06-18 MED ORDER — SUGAMMADEX SODIUM 200 MG/2ML IV SOLN
INTRAVENOUS | Status: DC | PRN
  Administered 2022-06-18: 200 mg via INTRAVENOUS

## 2022-06-18 MED ORDER — CHLORHEXIDINE GLUCONATE CLOTH 2 % EX PADS: 6.0000 | MEDICATED_PAD | Freq: Once | CUTANEOUS | Status: DC

## 2022-06-18 MED ORDER — PHENYLEPHRINE HCL-NACL 20-0.9 MG/250ML-% IV SOLN
INTRAVENOUS | Status: DC | PRN
  Administered 2022-06-18: 40 ug/min via INTRAVENOUS

## 2022-06-18 MED ORDER — INSULIN ASPART 100 UNIT/ML IJ SOLN
8.0000 [IU] | Freq: Once | INTRAMUSCULAR | Status: AC
  Administered 2022-06-18: 8 [IU] via SUBCUTANEOUS

## 2022-06-18 MED ORDER — MIDAZOLAM HCL 2 MG/2ML IJ SOLN
INTRAMUSCULAR | Status: AC
  Filled 2022-06-18: qty 2

## 2022-06-18 MED ORDER — LACTATED RINGERS IV SOLN: INTRAVENOUS | Status: DC

## 2022-06-18 MED ORDER — INSULIN ASPART 100 UNIT/ML IJ SOLN: 6.0000 [IU] | Freq: Once | INTRAMUSCULAR | Status: AC

## 2022-06-18 MED ORDER — ACETAMINOPHEN 10 MG/ML IV SOLN
INTRAVENOUS | Status: AC
  Filled 2022-06-18: qty 100

## 2022-06-18 MED ORDER — BUPIVACAINE HCL 0.25 % IJ SOLN
INTRAMUSCULAR | Status: DC | PRN
  Administered 2022-06-18: 10 mL

## 2022-06-18 MED ORDER — PROPOFOL 500 MG/50ML IV EMUL
INTRAVENOUS | Status: DC | PRN
  Administered 2022-06-18: 150 ug/kg/min via INTRAVENOUS

## 2022-06-18 MED ORDER — OXYCODONE HCL 5 MG PO TABS
5.0000 mg | ORAL_TABLET | Freq: Once | ORAL | Status: AC | PRN
  Administered 2022-06-18: 5 mg via ORAL

## 2022-06-18 MED ORDER — PROPOFOL 10 MG/ML IV BOLUS
INTRAVENOUS | Status: DC | PRN
  Administered 2022-06-18: 100 mg via INTRAVENOUS
  Administered 2022-06-18: 40 mg via INTRAVENOUS

## 2022-06-18 SURGICAL SUPPLY — 38 items
APL PRP STRL LF DISP 70% ISPRP (MISCELLANEOUS) ×2
APL SKNCLS STERI-STRIP NONHPOA (GAUZE/BANDAGES/DRESSINGS) ×2
BAG COUNTER SPONGE SURGICOUNT (BAG) ×2 IMPLANT
BAG SPNG CNTER NS LX DISP (BAG)
BENZOIN TINCTURE PRP APPL 2/3 (GAUZE/BANDAGES/DRESSINGS) ×2 IMPLANT
BLADE CLIPPER SURG (BLADE) IMPLANT
CANISTER SUCT 3000ML PPV (MISCELLANEOUS) IMPLANT
CHLORAPREP W/TINT 26 (MISCELLANEOUS) ×2 IMPLANT
CLSR STERI-STRIP ANTIMIC 1/2X4 (GAUZE/BANDAGES/DRESSINGS) IMPLANT
COVER SURGICAL LIGHT HANDLE (MISCELLANEOUS) ×2 IMPLANT
DRAPE LAPAROTOMY 100X72 PEDS (DRAPES) ×2 IMPLANT
DRSG IV TEGADERM 3.5X4.5 STRL (GAUZE/BANDAGES/DRESSINGS) IMPLANT
DRSG TEGADERM 4X4.75 (GAUZE/BANDAGES/DRESSINGS) ×2 IMPLANT
ELECT CAUTERY BLADE 6.4 (BLADE) IMPLANT
ELECT REM PT RETURN 9FT ADLT (ELECTROSURGICAL) ×2
ELECTRODE REM PT RTRN 9FT ADLT (ELECTROSURGICAL) ×2 IMPLANT
GAUZE 4X4 16PLY ~~LOC~~+RFID DBL (SPONGE) ×2 IMPLANT
GAUZE SPONGE 2X2 8PLY STRL LF (GAUZE/BANDAGES/DRESSINGS) ×2 IMPLANT
GLOVE BIO SURGEON STRL SZ7 (GLOVE) ×2 IMPLANT
GLOVE BIOGEL PI IND STRL 7.5 (GLOVE) ×2 IMPLANT
GOWN STRL REUS W/ TWL LRG LVL3 (GOWN DISPOSABLE) ×4 IMPLANT
GOWN STRL REUS W/TWL LRG LVL3 (GOWN DISPOSABLE) ×4
KIT BASIN OR (CUSTOM PROCEDURE TRAY) ×2 IMPLANT
KIT TURNOVER KIT B (KITS) ×2 IMPLANT
NDL HYPO 25GX1X1/2 BEV (NEEDLE) ×2 IMPLANT
NEEDLE HYPO 25GX1X1/2 BEV (NEEDLE) ×2 IMPLANT
NS IRRIG 1000ML POUR BTL (IV SOLUTION) ×2 IMPLANT
PACK GENERAL/GYN (CUSTOM PROCEDURE TRAY) ×2 IMPLANT
PAD ARMBOARD 7.5X6 YLW CONV (MISCELLANEOUS) ×2 IMPLANT
STRIP CLOSURE SKIN 1/2X4 (GAUZE/BANDAGES/DRESSINGS) ×2 IMPLANT
SUT MNCRL AB 4-0 PS2 18 (SUTURE) ×2 IMPLANT
SUT NOVA NAB DX-16 0-1 5-0 T12 (SUTURE) ×2 IMPLANT
SUT NOVA NAB GS-21 0 18 T12 DT (SUTURE) ×2 IMPLANT
SUT VIC AB 3-0 SH 27 (SUTURE) ×2
SUT VIC AB 3-0 SH 27X BRD (SUTURE) ×2 IMPLANT
SYR CONTROL 10ML LL (SYRINGE) ×2 IMPLANT
TOWEL GREEN STERILE (TOWEL DISPOSABLE) ×2 IMPLANT
TOWEL GREEN STERILE FF (TOWEL DISPOSABLE) ×2 IMPLANT

## 2022-06-18 NOTE — Anesthesia Postprocedure Evaluation (Signed)
Anesthesia Post Note  Patient: Brian Casey  Procedure(s) Performed: EXCISION OF MESH OPEN UMBILICAL HERNIA REPAIR (Abdomen)     Patient location during evaluation: PACU Anesthesia Type: General Level of consciousness: awake and alert Pain management: pain level controlled Vital Signs Assessment: post-procedure vital signs reviewed and stable Respiratory status: spontaneous breathing, nonlabored ventilation, respiratory function stable and patient connected to nasal cannula oxygen Cardiovascular status: blood pressure returned to baseline and stable Postop Assessment: no apparent nausea or vomiting Anesthetic complications: no   No notable events documented.  Last Vitals:  Vitals:   06/18/22 0858 06/18/22 0902  BP: 126/79   Pulse: 67   Resp: 17   Temp:  36.6 C  SpO2: 100%     Last Pain:  Vitals:   06/18/22 0915  PainSc: 0-No pain                 Jacqulene Huntley

## 2022-06-18 NOTE — Anesthesia Procedure Notes (Signed)
Procedure Name: Intubation Date/Time: 06/18/2022 11:06 AM  Performed by: Dorann Lodge, CRNAPre-anesthesia Checklist: Patient identified, Emergency Drugs available, Suction available and Patient being monitored Patient Re-evaluated:Patient Re-evaluated prior to induction Oxygen Delivery Method: Circle System Utilized Preoxygenation: Pre-oxygenation with 100% oxygen Induction Type: IV induction Ventilation: Mask ventilation without difficulty and Two handed mask ventilation required Laryngoscope Size: Mac and 4 Grade View: Grade I Tube type: Oral Tube size: 7.5 mm Number of attempts: 1 Airway Equipment and Method: Stylet Placement Confirmation: ETT inserted through vocal cords under direct vision, positive ETCO2 and breath sounds checked- equal and bilateral Secured at: 22 cm Tube secured with: Tape Dental Injury: Teeth and Oropharynx as per pre-operative assessment  Comments: DL x1 by SRNA, blade removed and 2nd DL by MDA, grade 1 view

## 2022-06-18 NOTE — Op Note (Signed)
Pre-op diagnosis: Suture granuloma and extruded mesh at the umbilicus Postop diagnosis: Same Procedure performed: Excision of old umbilical hernia mesh; Primary repair of umbilical hernia (2 cm fascial defect) Surgeon:Shanera Meske K Haillee Johann Anesthesia: General Indications: This is a 72 year old male with multiple medical issues who is status post open umbilical hernia repair with mesh in 2015.  Over the last year the patient has had multiple medical problems including pulmonary disease with a severe chronic cough.  He has been on steroids.  He developed tenderness at the umbilicus.  The mesh seems to have extruded through the fascia and is beginning to protrude through the skin.  CT scan showed no sign of recurrent hernia but I recommended excision of the old mesh and suture to prevent an infection at his umbilicus.  Description of procedure: The patient is brought to the operating room placed in the supine position on the operating room table.  After an adequate level of general anesthesia was obtained, his central abdomen was prepped with ChloraPrep and draped sterile fashion.  A timeout was taken to ensure the proper patient and proper procedure.  The patient has a small 3 mm opening with some protruding mesh material.  We infiltrated the entire area with local anesthetic.  I made a transverse incision through his old scar.  We dissected down into the subcutaneous tissues with cautery.  The patient has multiple Novafil sutures that were all removed.  1 corner of the mesh has extruded through the fascia and is the area that is protruding through the back of the skin.  We open the fascia with cautery.  I then excised all of the old mesh.  We were left with a 2 cm fascial defect.  The surrounding fascia all seems to be intact.  We closed the fascia with multiple interrupted figure-of-eight 0 Novafil sutures.  The subcutaneous tissues were closed with 3-0 Vicryl.  4 Monocryl was used to close the skin.  Benzoin and  Steri-Strips were applied.  He was extubated and brought to the recovery room in stable condition.  All sponge, instrument, and needle counts are correct.  Imogene Burn. Georgette Dover, MD, [facility]  General Surgery   06/18/2022 11:53 AM

## 2022-06-18 NOTE — Discharge Instructions (Addendum)
[facility] _______[facility]  UMBILICAL HERNIA REPAIR: POST OP INSTRUCTIONS  Always review your discharge instruction sheet given to you by the facility where your surgery was performed. IF YOU HAVE DISABILITY OR FAMILY LEAVE FORMS, YOU MUST BRING THEM TO THE OFFICE FOR PROCESSING.   DO NOT GIVE THEM TO YOUR DOCTOR.  1. A  prescription for pain medication may be given to you upon discharge.  Take your pain medication as prescribed, if needed.  If narcotic pain medicine is not needed, then you may take acetaminophen (Tylenol) or ibuprofen (Advil) as needed. 2. Take your usually prescribed medications unless otherwise directed. If you need a refill on your pain medication, please contact your pharmacy.  They will contact our office to request authorization. Prescriptions will not be filled after 5 pm or on week-ends. 3. You should follow a light diet the first 24 hours after arrival home, such as soup and crackers, etc.  Be sure to include lots of fluids daily.  Resume your normal diet the day after surgery. 4.Most patients will experience some swelling and bruising around the umbilicus or in the groin and scrotum.  Ice packs and reclining will help.  Swelling and bruising can take several days to resolve.  6. It is common to experience some constipation if taking pain medication after surgery.  Increasing fluid intake and taking a stool softener (such as Colace) will usually help or prevent this problem from occurring.  A mild laxative (Milk of Magnesia or Miralax) should be taken according to package directions if there are no bowel movements after 48 hours. 7. Unless discharge instructions indicate otherwise, you may remove your bandages 24-48 hours after surgery, and you may shower at that time.  You may have steri-strips (small skin tapes) in place directly over the incision.  These strips should be left on the skin for 7-10 days.  If your surgeon used skin glue on the incision, you may  shower in 24 hours.  The glue will flake off over the next 2-3 weeks.  Any sutures or staples will be removed at the office during your follow-up visit. 8. ACTIVITIES:  You may resume regular (light) daily activities beginning the next day--such as daily self-care, walking, climbing stairs--gradually increasing activities as tolerated.  You may have sexual intercourse when it is comfortable.  Refrain from any heavy lifting or straining until approved by your doctor.  a.You may drive when you are no longer taking prescription pain medication, you can comfortably wear a seatbelt, and you can safely maneuver your car and apply brakes. b.RETURN TO WORK:   _____________________________________________  9.You should see your doctor in the office for a follow-up appointment approximately 2-3 weeks after your surgery.  Make sure that you call for this appointment within a day or two after you arrive home to insure a convenient appointment time. 10.OTHER INSTRUCTIONS: _________________________    _____________________________________  WHEN TO CALL YOUR DOCTOR: Fever over 101.0 Inability to urinate Nausea and/or vomiting Extreme swelling or bruising Continued bleeding from incision. Increased pain, redness, or drainage from the incision  The clinic staff is available to answer your questions during regular business hours.  Please don't hesitate to call and ask to speak to one of the nurses for clinical concerns.  If you have a medical emergency, go to the nearest emergency room or call 911.  A surgeon from [facility] is always on call at the hospital   [address] [place], [place], [place]  [id number] ?  [address], [place], [place]   [address] [phone] ? [phone] ? FAX [phone] Web site: [email or web address]          No acetaminophen/Tylenol until after 3.22 pm today if needed.      Post Anesthesia Home Care  Instructions  Activity: Get plenty of rest for the remainder of the day. A responsible individual must stay with you for 24 hours following the procedure.  For the next 24 hours, DO NOT: -Drive a car -Paediatric nurse -Drink alcoholic beverages -Take any medication unless instructed by your physician -Make any legal decisions or sign important papers.  Meals: Start with liquid foods such as gelatin or soup. Progress to regular foods as tolerated. Avoid greasy, spicy, heavy foods. If nausea and/or vomiting occur, drink only clear liquids until the nausea and/or vomiting subsides. Call your physician if vomiting continues.  Special Instructions/Symptoms: Your throat may feel dry or sore from the anesthesia or the breathing tube placed in your throat during surgery. If this causes discomfort, gargle with warm salt water. The discomfort should disappear within 24 hours.

## 2022-06-18 NOTE — H&P (Signed)
Subjective    Chief Complaint: Follow-up ( NEW PROBLEM - umbilical pain/hernia, )   PCP - Dr. Maryland Casey Pulm - Dr. Chase Casey Cardiology - Dr. Rockey Casey   History of Present Illness: Brian Casey is a 72 y.o. male who is seen today as an office consultation at the request of Dr. Kary Casey for evaluation of Follow-up ( NEW PROBLEM - umbilical pain/hernia, ) .     This is a 72 year old male who underwent open umbilical hernia repair with a small Ventralex mesh on 11/16/2013.  The fascial defect was closed anterior to the mesh.  In 2017, he was admitted to the hospital with acute cholecystitis but no sign of gallstones.  He was also found to have atrial fibrillation with RVR.  He was ruled in for myocardial infarction.  Percutaneous cholecystostomy tube was placed.  He underwent carotid endarterectomy and coronary artery bypass grafting in August 2017.  In October 2017, he underwent laparoscopic cholecystectomy by Dr. Dalbert Casey.  At that time, the mesh was intact.  The periumbilical trocar was placed superior to the mesh.   Over the last year, the patient has developed significant pulmonary disease.  Last January, he presented with several months of a severe chronic cough.  He was later diagnosed with interstitial lung disease.  He is on chronic prednisone.  He requires frequent as needed home oxygen.  He is able to ambulate and is able to drive but cannot have any type of exertion without getting severely short of breath.  Over the last several months, he has developed tenderness at his umbilicus with mesh that is easily palpable.  He was evaluated in August by one of my partners.  No recurrent umbilical hernia was noted but the mesh is easily palpable underneath the skin.  It seems that the fascia is no longer intact over the hernia mesh.  CT scan at that time confirmed no ventral or umbilical hernia.  He does have small fat-containing inguinal hernias that are completely asymptomatic.   Over the last  few weeks, the patient has begun to notice a tiny edge of the mesh protruding through a small pinhole in the upper umbilicus.  No erythema.  No drainage noted.   Of note, the patient's wife has a enlarging thoracoabdominal aortic aneurysm and is scheduled to have some type of endovascular procedure performed at [facility] in the near future. Review of Systems: A complete review of systems was obtained from the patient.  I have reviewed this information and discussed as appropriate with the patient.  See HPI as well for other ROS.   Review of Systems  Constitutional: Negative.   HENT: Negative.    Eyes: Negative.   Respiratory:  Positive for cough and shortness of breath.   Cardiovascular: Negative.   Gastrointestinal:  Positive for abdominal pain.  Genitourinary: Negative.   Musculoskeletal: Negative.   Skin: Negative.   Neurological: Negative.   Endo/Heme/Allergies: Negative.   Psychiatric/Behavioral: Negative.          Medical History: Past Medical History         Past Medical History:  Diagnosis Date   Atrial fibrillation (CMS-HCC) 11/07/2015   Coronary artery disease 11/07/2015   Diabetes mellitus type 2, uncomplicated (CMS-HCC)     GERD (gastroesophageal reflux disease)     Hemorrhoids     Hypertension     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 (CMS-HCC) 08/12/2021   Pleural effusion     Thyroid disease  Patient Active Problem List  Diagnosis   Anxiety state   Bilateral carotid artery disease (CMS-HCC)   Chronic interstitial lung disease (CMS-HCC)   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 (CMS-HCC)   Chronic ischemic heart disease   NSTEMI (non-ST elevated myocardial infarction) (CMS-HCC)   CKD (chronic kidney disease), stage II   Aortic atherosclerosis (CMS-HCC)   Depressive disorder, not elsewhere classified   Situational depression   Diabetes  mellitus type 2, uncontrolled   Essential hypertension   Essential (primary) hypertension   Generalized anxiety disorder   HLD (hyperlipidemia)   Hyponatremia   Hypothyroidism   Left low back pain   Left ventricular diastolic dysfunction   Occlusion and stenosis of bilateral carotid arteries   S/P CABG x 4   Rectus diastasis   Umbilical hernia   High blood pressure   Erectile dysfunction   Edema, unspecified   Dizziness   Diabetes mellitus (CMS-HCC)   Coronary atherosclerosis of native coronary artery   Carotid stenosis   GERD (gastroesophageal reflux disease)   Family history of colon cancer   Colon cancer screening   History of hemorrhoids   Diarrhea due to drug   Tubular adenoma of colon      Past Surgical History           Past Surgical History:  Procedure Laterality Date   umbilical hernia removal   10/31/2013   CORONARY ARTERY BYPASS GRAFT   11/14/2015    4 bypass cabg   Colon @ [facility]   11/19/2021    Tubular adenoma/FHx CC/Repeat 68yrs/TKT   EGD @ [facility]   11/19/2021    Gastritis/Normal EGD biopsy/No Repeat/TKT   CARDIAC CATHETERIZATION   11/05/2015        Allergies           Allergies  Allergen Reactions   Amlodipine Swelling   Bupropion Hcl Other (See Comments)      insomnia   Chlorthalidone Unknown   Metformin Diarrhea   Penicillin Vomiting   Terazosin Other (See Comments)                   Current Outpatient Medications on File Prior to Visit  Medication Sig Dispense Refill   albuterol 90 mcg/actuation inhaler Inhale 2 inhalations into the lungs every 4 (four) hours as needed for Wheezing 1 each 3   ALPRAZolam (XANAX) 0.5 MG tablet TAKE 1 TABLET AT BEDTIME ASNEEDED FOR SLEEP FOR UP TO 90 DAYS 90 tablet 0   aspirin 81 MG EC tablet Take 1 tablet (81 mg total) by mouth once daily       atorvastatin (LIPITOR) 40 MG tablet Take 1 tablet (40 mg total) by mouth once daily 90 tablet 3   blood glucose diagnostic test strip Use 2 (two) times daily 200 each 3    blood glucose meter kit Use as directed. 1 each 0   budesonide (PULMICORT) 0.25 mg/2 mL nebulizer solution Take 2 mLs (0.25 mg total) by nebulization 2 (two) times daily for 90 days (Patient not taking: Reported on 01/22/2022) 360 mL 0   carvediloL (COREG) 12.5 MG tablet Take 1 tablet (12.5 mg total) by mouth 2 (two) times daily with meals 180 tablet 3   FUROsemide (LASIX) 20 MG tablet Take 1 tablet (20 mg total) by mouth once daily 90 tablet 0   HYDROcodone-chlorpheniramine (TUSSIONEX) 10-8 mg/5 mL ER suspension Take 5 mLs by mouth every 12 (twelve) hours as needed for Cough 120 mL  0   ipratropium-albuteroL (DUO-NEB) nebulizer solution INHALE THE CONTENTS OF 1   VIAL VIA NEBULIZER TWO     TIMES A DAY (Patient not taking: Reported on 01/22/2022) 540 mL 1   lancets Use 1 each 2 (two) times daily 200 each 3   levothyroxine (SYNTHROID) 100 MCG tablet Take 1 tablet (100 mcg total) by mouth once daily Take on an empty stomach with a glass of water at least 30-60 minutes before breakfast. 90 tablet 3   loperamide (IMODIUM A-D) 2 mg tablet Take 1 tablet (2 mg total) by mouth 4 (four) times daily as needed for Diarrhea 180 tablet 3   mometasone (ASMANEX TWISTHALER) 220 mcg/ actuation (30) inhaler Inhale 1 inhalation into the lungs once daily (Patient not taking: Reported on 01/22/2022) 1 each 3   nintedanib 150 mg Cap Take 1 capsule by mouth 2 (two) times daily       omeprazole (PRILOSEC) 20 MG DR capsule Take 1 capsule (20 mg total) by mouth once daily 90 capsule 3   potassium chloride (KLOR-CON) 10 mEq ER tablet Take 2 tabs BID x3 days, then 1 tab daily 90 tablet 1   predniSONE (DELTASONE) 10 MG tablet Take 1 tablet by mouth daily with breakfast       tadalafiL (CIALIS) 10 MG tablet Take 10 mg by mouth once daily as needed for Erectile Dysfunction Take dose 30-45 min prior to anticipated sexual activity.       tiotropium-olodateroL (STIOLTO RESPIMAT) 2.5-2.5 mcg/actuation inhaler Inhale 2 inhalations into  the lungs once daily (Patient not taking: Reported on 01/22/2022) 4 g 3    No current facility-administered medications on file prior to visit.      Family History           Family History  Problem Relation Age of Onset   Colon cancer Mother     High blood pressure (Hypertension) Mother     Thyroid disease Father     Prostate cancer Father     High blood pressure (Hypertension) Father     Diabetes type II Maternal Grandmother     Lung cancer Maternal Grandmother          Social History           Tobacco Use  Smoking Status Former   Packs/day: 1.00   Years: 40.00   Additional pack years: 0.00   Total pack years: 40.00   Types: Cigarettes   Quit date: 11/01/2015   Years since quitting: 6.4  Smokeless Tobacco Never      Social History  Social History             Socioeconomic History   Marital status: Married  Occupational History   Occupation: retired  Tobacco Use   Smoking status: Former      Packs/day: 1.00      Years: 40.00      Additional pack years: 0.00      Total pack years: 40.00      Types: Cigarettes      Quit date: 11/01/2015      Years since quitting: 6.4   Smokeless tobacco: Never  Vaping Use   Vaping Use: Never used  Substance and Sexual Activity   Alcohol use: Yes      Alcohol/week: 2.0 standard drinks of alcohol      Types: 2 Cans of beer per week   Drug use: No   Sexual activity: Yes      Partners: Female  Birth control/protection: Post-menopausal    Social Determinants of Health           Financial Resource Strain: Low Risk  (01/06/2022)    Overall Financial Resource Strain (CARDIA)     Difficulty of Paying Living Expenses: Not very hard  Food Insecurity: No Food Insecurity (01/06/2022)    Hunger Vital Sign     Worried About Running Out of Food in the Last Year: Never true     Ran Out of Food in the Last Year: Never true  Transportation Needs: No Transportation Needs (01/06/2022)    PRAPARE - Visual merchandiser (Medical): No     Lack of Transportation (Non-Medical): No  Housing Stability: Unknown (01/06/2022)    Housing Stability Vital Sign     Unable to Pay for Housing in the Last Year: No     Number of Places Lived in the Last Year: 1        Objective:          Physical Exam    Constitutional:  WDWN in NAD, conversant, no obvious deformities; lying in bed comfortably Eyes:  Pupils equal, round; sclera anicteric; moist conjunctiva; no lid lag HENT:  Oral mucosa moist; good dentition  Neck:  No masses palpated, trachea midline; no thyromegaly Lungs:  CTA bilaterally; normal respiratory effort.  The patient becomes short of breath with minimal exertion. CV:  Regular rate and rhythm; no murmurs; extremities well-perfused with no edema Abd:  +bowel sounds, soft, non-tender, no palpable organomegaly; no palpable hernias;  The umbilical incision is well-healed.  In the upper part of the umbilicus, there is a small pinhole with what appears to be some protruding mesh.  No erythema or drainage is noted.  This area is minimally tender.  No sign of recurrent hernia with Valsalva maneuver. Musc: Normal gait; no apparent clubbing or cyanosis in extremities Lymphatic:  No palpable cervical or axillary lymphadenopathy Skin:  Warm, dry; no sign of jaundice Psychiatric - alert and oriented x 4; calm mood and affect     Labs, Imaging and Diagnostic Testing: CLINICAL DATA:  Periumbilical abdomen pain. Previous hernia repair with mesh.   EXAM: CT ABDOMEN AND PELVIS WITH CONTRAST   TECHNIQUE: Multidetector CT imaging of the abdomen and pelvis was performed using the standard protocol following bolus administration of intravenous contrast.   RADIATION DOSE REDUCTION: This exam was performed according to the departmental dose-optimization program which includes automated exposure control, adjustment of the mA and/or kV according to patient size and/or use of iterative reconstruction  technique.   CONTRAST:  172mL ISOVUE-300 IOPAMIDOL (ISOVUE-300) INJECTION 61%   COMPARISON:  November 04, 2015   FINDINGS: Lower chest: Patchy ground-glass opacity and increased pulmonary markings are identified throughout bilateral lung bases more prominently involving the right lung base compared to left. Small left pleural effusion is noted. The heart size is enlarged.   Hepatobiliary: There is diffuse low density of the liver without focal liver lesion. Prior cholecystectomy. The biliary tree is normal.   Pancreas: Unremarkable. No pancreatic ductal dilatation or surrounding inflammatory changes.   Spleen: Normal in size without focal abnormality.   Adrenals/Urinary Tract: Adrenal glands are unremarkable. Kidneys are normal, without renal calculi, focal lesion, or hydronephrosis. Bladder is unremarkable. 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   Stomach/Bowel: There is mild diffuse bowel wall thickening of the sigmoid colon. There is bowel wall thickening of a segment of small bowel loop in the posterior midabdomen. The  appendix is normal. Minimal hiatal hernia is identified. The stomach is otherwise normal.   Vascular/Lymphatic: Aortic atherosclerosis. No enlarged abdominal or pelvic lymph nodes.   Reproductive: Prostate calcifications identified.   Other: Minimal bilateral inguinal herniation of mesenteric fat is identified. No evidence of ventral hernia.   Musculoskeletal: No acute abnormality.   IMPRESSION: 1. Mild diffuse bowel wall thickening of the sigmoid colon. There is bowel wall thickening of a segment of small bowel loop in the posterior midabdomen. The findings are nonspecific but can be seen in infectious or inflammatory etiology. 2. Fatty infiltration of liver. 3. Patchy ground-glass opacity and increased pulmonary markings are identified throughout bilateral lung bases more prominently involving the right lung base compared to  left. This is nonspecific but can be seen in infectious/inflammatory etiology.   Aortic Atherosclerosis (ICD10-I70.0).     Electronically Signed   By: Abelardo Diesel M.D.   On: 12/11/2021 12:27   Assessment and Plan:  Diagnoses and all orders for this visit:   Umbilical pain   Hx of umbilical hernia repair     We will first obtain cardiac and pulmonary clearance from his cardiologist and pulmonologist.  He will need to hold his aspirin for 5 days prior to surgery.  It is unclear what his pulmonologist wants to do with his steroids.  Apparently there had been some discussion about switching medications and getting him off prednisone.   After obtaining clearance, we will proceed with surgery.  He needs to have the old mesh excised.  We will then examine the remaining fascia.  He most likely needs to have a another hernia repair with a different type of mesh.  If there is sign of infection from the protruding mesh, we will not likely use synthetic mesh due to the risk of infection.  We will have to decide the type of repair at the time of surgery.   The surgical procedure has been discussed with the patient.  Potential risks, benefits, alternative treatments, and expected outcomes have been explained.  All of the patient's questions at this time have been answered.  The likelihood of reaching the patient's treatment goal is good.  The patient understand the proposed surgical procedure and wishes to proceed.    Imogene Burn. Georgette Dover, MD, [facility]  General Surgery   06/18/2022 9:54 AM

## 2022-06-18 NOTE — Transfer of Care (Signed)
Immediate Anesthesia Transfer of Care Note  Patient: JAHSHAWN SCHULTEIS  Procedure(s) Performed: EXCISION OF MESH OPEN UMBILICAL HERNIA REPAIR (Abdomen)  Patient Location: PACU  Anesthesia Type:General  Level of Consciousness: awake and alert   Airway & Oxygen Therapy: Patient Spontanous Breathing and Patient connected to nasal cannula oxygen  Post-op Assessment: Report given to RN and Post -op Vital signs reviewed and stable  Post vital signs: Reviewed and stable  Last Vitals:  Vitals Value Taken Time  BP    Temp    Pulse 58 06/18/22 1201  Resp 17 06/18/22 1201  SpO2 98 % 06/18/22 1201  Vitals shown include unvalidated device data.  Last Pain:  Vitals:   06/18/22 0915  PainSc: 0-No pain         Complications: No notable events documented.

## 2022-06-19 ENCOUNTER — Encounter (HOSPITAL_COMMUNITY): Payer: Self-pay | Admitting: Surgery

## 2022-06-30 ENCOUNTER — Ambulatory Visit: Payer: Medicare HMO | Admitting: Urology

## 2022-07-21 ENCOUNTER — Ambulatory Visit: Payer: No Typology Code available for payment source | Admitting: Internal Medicine

## 2022-07-21 ENCOUNTER — Encounter: Payer: Self-pay | Admitting: Internal Medicine

## 2022-07-21 VITALS — BP 140/60 | HR 48 | Ht 66.5 in | Wt 177.4 lb

## 2022-07-21 DIAGNOSIS — J9611 Chronic respiratory failure with hypoxia: Secondary | ICD-10-CM | POA: Diagnosis not present

## 2022-07-21 DIAGNOSIS — J679 Hypersensitivity pneumonitis due to unspecified organic dust: Secondary | ICD-10-CM | POA: Diagnosis not present

## 2022-07-21 DIAGNOSIS — Z7952 Long term (current) use of systemic steroids: Secondary | ICD-10-CM

## 2022-07-21 DIAGNOSIS — Z79899 Other long term (current) drug therapy: Secondary | ICD-10-CM

## 2022-07-21 DIAGNOSIS — J849 Interstitial pulmonary disease, unspecified: Secondary | ICD-10-CM | POA: Diagnosis not present

## 2022-07-21 LAB — HEPATIC FUNCTION PANEL
ALT: 21 U/L (ref 0–53)
AST: 15 U/L (ref 0–37)
Albumin: 3.9 g/dL (ref 3.5–5.2)
Alkaline Phosphatase: 74 U/L (ref 39–117)
Bilirubin, Direct: 0.2 mg/dL (ref 0.0–0.3)
Total Bilirubin: 1.1 mg/dL (ref 0.2–1.2)
Total Protein: 7.1 g/dL (ref 6.0–8.3)

## 2022-07-21 MED ORDER — PREDNISONE 1 MG PO TABS: 8.0000 mg | ORAL_TABLET | Freq: Every day | ORAL | 6 refills | Status: DC

## 2022-07-21 NOTE — Progress Notes (Signed)
OV 11/04/2021  Subjective:  Patient ID: Brian Casey, male , DOB: December 06, 1950 , age 72 y.o. , MRN: [id number] , ADDRESS: [address] [place] [address] PCP Jerl Mina, MD Patient Care Team: Jerl Mina, MD as PCP - General (Family Medicine) Antonieta Iba, MD as Consulting Physician (Cardiology) Lovett Sox, MD as Consulting Physician (Cardiothoracic Surgery)  This Provider for this visit: Treatment Team:  Attending Provider: Kalman Shan, MD    11/04/2021 -  hx chart reviwee,patient, wife, ILD questions Chief Complaint  Patient presents with   Consult    Pt has had recent imaging performed which showed diagnosis of ILD. Pt has had complaints of cough and SOB.     HPI Brian Casey 72 y.o. -  Presents with wife. Has hx of heavy asbestos ezposure. Former smoker. In Aug 2017 had acute chole, e colii bcacteremia and NSTEMI and sp CABG. CTA at that time showed some GGO and bialteral efffusion. Then in 2019 June admitted for had pleuirsy and s/p thora 1L (rad review confirms that). LHD 579. CUtlure negatiuve. WAS EOSINOPHLIC 40%. C at that the time considered as ILD by chest radiology. Also had AKI creat 1.76mg %. Then started following with Dr Meredeth Ide in [place]. Says he was on surveillance approach. No thora ever after that. In April 2022 he had CT - the ILD for first time started getting worse but he was feeling fine. In June 2022 started seeing Dr Karna Christmas. ARound this time also had Covid. In Nov 2022 had another CT cchest  ILD progressed but stil feeling ok. In Jan 2023 developed cough, ACE inhibitor got changed to ARB and sometime later got started in pred/immuran as well for his ILD. Then developed flu like symptoms. Both ARB and immuran g t stopped and he got better. Immuran restarted at lower dose at 50mg  per day in May 2023. CT at this time showed worsening ILD. 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 Prednisone at 20mg  per day. No more flu symptoms.  Feels side effect was ARB related. Despte immune suppression regimen he feels he is declining more dysneic. Still on room air at home and started rehab few days ago. Noticed in rehab needing 4L [place] to exert  (also confirmed here this visit). In addition prednisone is causing insomina and fatigue. He feels this med regimen is not helping  Of note he has upcoing endoscopy and colonoscop with Dr Norma Fredrickson 11/19/21. Also has a verntral hernia for which he wants surgery. Overall he and his wife are very concerned about this ILD   He is also on Inhaler Rx and not sure it is Physiological scientist Integrated Comprehensive ILD Questionnaire  Symptoms:  gradally getting worse x 6 months. Cough x 6 months only but CT actually getting worse Apr 2022 -> May 2023. 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 Worked for [employer] in [place]  PULMONARY TOXICITY HISTORY (27 items):  Prednisone and Immuran since May 2023  INVESTIGATIONS: 2017 CT abdomen lung cuts  He had left-sided pleural effusion and reticular abnormalities suggestive of ILD [back then it was  called as bibasilar atelectasis]  2017 August CT scan of the chest: Bilateral pleural effusions some upper lobe groundglass opacities.  No clear-cut evidence of ILD:  2019 June CT scan of the chest: Read by Dr. Ashley Murrain.  Concerning for ILD.  Reticulation also left-sided pleural effusion  May 2021 high-resolution CT chest: Read by Dr. Ashley Murrain.  Indeterminate pattern for UIP versus alternate diagnosis.Marland Kitchen  Definite of ILD.  There is subpleural sparing.  Left-sided pleural effusion present.  Findings deemed is unchanged from prior.  I personally agree with the findings  April 2022 CT chest without contrast [not a high-resolution CT chest].  Personally thought ILD is worse.  Radiology seems to agree  November 2022 in May 2023 CT scan of the chest [again not high-resolution] definite worsening of ILD changes.  Increased groundglass component.  May 2023: CT - progressive ILD. Worsning GGO and reticulation. Trace left effuion   UGI score 11/19/21:: Diffuse mild inflammation characterized by congestion (edema) and erosions was found in the entire examined stomach.   OV 11/20/2021  Subjective:  Patient ID: Brian Casey, male , DOB: 1950/03/24 , age 63 y.o. , MRN: [id number] , ADDRESS: [address] [place] [address] PCP Jerl Mina, MD Patient Care Team: Jerl Mina, MD as PCP - General (Family Medicine) Antonieta Iba, MD as Consulting Physician (Cardiology) Lovett Sox, MD as Consulting Physician (Cardiothoracic Surgery)  This Provider for this visit: Treatment Team:  Attending Provider: Kalman Shan, MD   11/20/2021 -returns for video visit.  ILD work-up in progress.   HPI KORE Casey 72 y.o. -  #ILD work-up: After his last visit I presented him at the thoracic multidisciplinary conference.  Dr. Dorothey Baseman felt the CT scan was more consistent with alternative pattern possibly fibrotic NSIP or even HP given the upper lobe predominance and air trapping.  He  subsequently had a high-resolution CT scan of the chest that was read by Dr. Trudie Reed who feels CT scan is more consistent with chronic HP.  We went over the exposures again there is no current exposure.  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  While he remembers asbestos exposure he does not remember mold exposure but it was definitely water and there was a lot of dampness.  There is no evidence of asbestos plaques or UIP pattern on the CT scan of the chest.  Nevertheless it is progressive.  We discussed the differential diagnosis currently of chronic HP versus NSIP versus asbestosis versus IPF.  It appears that most likely this is chronic HP.  Based on this and the progression my recommendation to him was that we would start antifibrotic's.  Did indicate to him that there is a role for prednisone and immunomodulators in the setting of chronic NSIP or HP but given the side effect profile with prednisone [fatigue and insomnia] and also future potential risk for immunosuppression  with Imuran I advised that antifibrotic's nintedanib would be first-line.  He is better now with 10 mg prednisone.  He is sleeping better.  Therefore also recommended continuing with prednisone.  Recommended holding off any restarting of the Imuran or consideration of CellCept as a third line option.  We will also going to hold off on surgical lung biopsy.  Indicated to him that in the long run he would need to lose weight get fit and consider transplant as an option.  For these purposes I would like to avoid immunosuppression and also surgical lung biopsy.  In addition with 4 L exertion desaturation did not want to consider surgical lung biopsy at this point in time.  He and his wife are agreeable with the plan  #Therapeutic side effects: After stopping prednisone his insomnia and fatigue are better.  We discussed  nintedanib.  He does have diverticulosis on the recent CT scan but has never had diverticulitis.  He does not have a history of GI bleeding.  No history of MI.  Recent echo was normal.  We discussed the diarrhea and liver toxicity monitoring of the rare side effects of nintedanib.  He is agreeable to start this.  #Hypoxemia: He told me that he is desaturating at home with 4 L but does well in rehab because of his machine.  I recommended oxygen titration test but my CMA indicated to me that he did fine with 4 L exertion in our office at last visit.  She will him to contact him and verify if he is still desaturating at home.  If so then we will have to do a detailed oxygen titration test.  He is still awaiting his Lindell Spar through the [place].  He does not want to do it through [employer] and it could take a while.  #Umbilical hernia repair.  This is being held off at this point.       HRCT 11/13/21  Narrative & Impression  CLINICAL DATA:  72 year old male with history of cough and shortness of breath worsening since May 2023. Evaluate for interstitial lung disease.   EXAM: CT CHEST WITHOUT CONTRAST   TECHNIQUE: Multidetector CT imaging of the chest was performed following the standard protocol without intravenous contrast. High resolution imaging of the lungs, as well as inspiratory and expiratory imaging, was performed.   RADIATION DOSE REDUCTION: This exam was performed according to the departmental dose-optimization program which includes automated exposure control, adjustment of the mA and/or kV according to patient size and/or use of iterative reconstruction technique.   COMPARISON:  Chest CT 08/20/2021.   FINDINGS: Cardiovascular: Heart size is normal. There is no significant pericardial fluid, thickening or pericardial calcification. There is aortic atherosclerosis, as well as atherosclerosis of the great vessels of the mediastinum and the coronary arteries,  including calcified atherosclerotic plaque in the left main, left anterior descending, left circumflex and right coronary arteries. Status post median sternotomy for CABG including LIMA to the LAD.   Mediastinum/Nodes: No pathologically enlarged mediastinal or hilar lymph nodes. Please note that accurate exclusion of hilar adenopathy is limited on noncontrast CT scans. Esophagus is unremarkable in appearance. No axillary lymphadenopathy.   Lungs/Pleura: Widespread but patchy areas of ground-glass attenuation, septal thickening, subpleural reticulation, thickening of the peribronchovascular interstitium, cylindrical bronchiectasis and peripheral bronchiolectasis are noted. Some areas of apparent developing honeycombing are noted, most evident near the apex of the right upper lobe. Overall, findings appear relatively similar to the recent prior chest CT. Inspiratory and expiratory  imaging demonstrates some mild air trapping indicative of mild small airways disease. No definite suspicious appearing pulmonary nodules or masses are noted. No pleural effusions.   Upper Abdomen: Aortic atherosclerosis.  Status post cholecystectomy.   Musculoskeletal: Median sternotomy wires. There are no aggressive appearing lytic or blastic lesions noted in the visualized portions of the skeleton.   IMPRESSION: 1. The appearance of the lungs is indicative of interstitial lung disease, with minimal progression compared to the most recent prior study, but definitive progression when compared to more remote prior examinations. 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 2. Aortic atherosclerosis, in addition to left main and three-vessel coronary artery disease. Status post median sternotomy for CABG including LIMA to the LAD.   Aortic Atherosclerosis  (ICD10-I70.0).     Electronically Signed   By: Trudie Reed M.D.   On: 11/15/2021 06:28    ECHO 11/12/21  IMPRESSIONS     1. Left ventricular ejection fraction, by estimation, is 60 to 65%. The  left ventricle has normal function. The left ventricle has no regional  wall motion abnormalities. There is mild left ventricular hypertrophy.  Left ventricular diastolic parameters  are consistent with Grade I diastolic dysfunction (impaired relaxation).   2. Right ventricular systolic function is normal. The right ventricular  size is normal.   3. The mitral valve is normal in structure. No evidence of mitral valve  regurgitation.   4. The aortic valve is tricuspid. Aortic valve regurgitation is not  visualized. Aortic valve sclerosis is present, with no evidence of aortic  valve stenosis.   5. Aortic dilatation noted. There is mild dilatation of the aortic root,  measuring 39 mm.   6. The inferior vena cava is normal in size with greater than 50%  respiratory variability, suggesting right atrial pressure of 3 mmHg.    Aug 2023 ILD confi with Dr Dorothey Baseman  In 2019 early ILD with reticuation. SIgnifcant progressiont hrough latest CT in May 2023. Lot of upper lobe > Lower lobe back then and now. OVerall pattern: ALTERNATIVE CATEGRORy. DDx is FIBROTIC HP (2021 Exp phase imaging was inadequate , 2020 there was mild air trappng). In May 2023 - more GGO  ? flare. Overall patch peribronchosvascular. NOT c/w UIP. Has pleural thicekning on left but prob due to cardiac urgery. No calcifiied pleural plaques  Consider bix if Risk ok   OV 01/13/2022  Subjective:  Patient ID: Brian Casey, male , DOB: 01-06-1951 , age 35 y.o. , MRN: [id number] , ADDRESS: [address] [place] [address] PCP Jerl Mina, MD Patient Care Team: Jerl Mina, MD as PCP - General (Family Medicine) Antonieta Iba, MD as Consulting Physician (Cardiology) Lovett Sox, MD as Consulting  Physician (Cardiothoracic Surgery)  This Provider for this visit: Treatment Team:  Attending Provider: Kalman Shan, MD    01/13/2022 -   Chief Complaint  Patient presents with   Follow-up    Follow-up PT states breathing is not better, DOE   ILD - workup complete. 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 - prsents  with wife for followup. He and wife are quire worried about his health and life expectancy (few to several years) and quality of life He has sveral concrns  Easy destruation- sometimes to 60% and 75%. Always with eerrtion and relieved by rest but there is variability. He is unsure of quality of pulse ox probe but appears same thing inr ehab based on test chart review. STill here able to walk ADL distances ith 6L and without desats Echo shows Gr 1 DDx - has seen cards Saw Dr Mariah Milling - has not had RHC Feels rehab did not help him though distance improved fom 400+ feet to 700 + feet ILD Symptoms: feels getting worse. Needing more o2 Not yet started night o2: ONO results are finally in but with PCP Jerl Mina, MD who is at [facility] clinic Rx  On ofev but having severe diarrhea. Takes immodium in reactionary fashion every other day. Drinks coffee daily.  Drinks "no sugar added" apple juice Rx - continue prednisone 10mg  per day Pointd out he worked in [employer] [place] for [employer] Prognosis - explained could be few to several years but with small % declining rapidly in < 1 year.  In his case PFTs show sever restriction and dLCO Also unintenitional weight loss due to ofev - but he sees this as positive due to obsity Has new low K 3 meq 12/31/21 and was wondering if due to prednisone  (unlikely) or diarrhea (possibly yes) Abd hernia - surgery not in the works Obesity - BMI is > 30 and is a barrier to health  =     OV 03/12/2022  Subjective:  Patient ID: Brian Casey, male , DOB: [birth year] , age 5 y.o. , MRN: [id number] , ADDRESS: [address] [place] [address] PCP Jerl Mina, MD Patient Care Team: Jerl Mina, MD as PCP - General (Family Medicine) Antonieta Iba, MD as PCP - Cardiology (Cardiology) Lovett Sox, MD as Consulting Physician (Cardiothoracic Surgery)   Type of visit: Video Virtual Visit Identification of patient MATTEW CHRISWELL with Oct 21, 1950 and MRN [id number] - 2 person identifier Risks: Risks, benefits, limitations of telephone visit explained. Patient understood and verbalized agreement to proceed Anyone else on call: wife Patient location: home This provider location: [address], [address]; [place]; [place] [address]. [facility]. [phone]    This Provider for this visit: Treatment Team:  Attending Provider: Kalman Shan, MD  Chronic HP  - failed ofev -  GI and weight loss  - prior on immuran Chronic Resp failure 4L [place] Obesity  - goal weight 170-175#  03/12/2022 -  No chief complaint on file.    HPI YORDI KRAGER 72 y.o. -stopped ofev 02/16/22. Recent increase in cough. PCP office was positive for flu 03/05/22. Since then cough is worse. Rx with Tamiflu but has not helped. Using hydrocodone more now but still has cough at night. Slowly improving though.  On 02/23/22 his bili was 1.4 (After stopping ofev) but rechck 03/03/22 bilis is 1.2. Currently 4L [place] day and at night 2L [place] (has gotten the [place] Night o2). Wife says even with exertion at 4L  drops to 77% . Unable to do "Anything" without desats. Currently remains on 10mg  prednisone. HE has challenge going up on prednisone diue to insominia. But now PCP has him on xanax and is helping sleep . So he feels he can challenge himself  with higher dose prednisone   ILD Rx:   -  Discussed transplant: wife dealing with aneurysm thoracoabdominal and is awaitig surgery in [facility] with a device in [place].  They are busy with all this. Surgery is in Q2 2024 and is taking a lot of her time.  This can prove a challenge. Wife is only 1 caregiver. There are kids but they are working. Living in [place]. But after discussion willing to see them.   - antifibrotic : did not tolerate ofev.  We discussed esbriet. Explained side effects. Explained dosing. Will have to go through the [facility]. He Is willing. BRiefly discussed immuran and cellcept as optoons  - exertional hypoxemia: advised to increase o2 with exertion. In Oct we needed to increase to [facility]. Home concerntrator does not go to 6l [place] He wants order to go through CAT at the [facility]. HE says our [facility] knows the [facility] contact  Right heart cath 02/03/2022- PAP . -> does not meet criteria for tyvaso    06/05/2022 Follow up : ILD, O2 RF  Patient presents for a 6-week follow-up.  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  He has a history of heavy exposure to asbestos with previous work at [employer].  He has previously tried [employer] but was intolerant.  Patient started on Esbriet the beginning of February 2024.  Patient was tolerating well up until he started Esbriet 2 capsules 3 times daily.  Had significant nausea and decreased appetite.  Patient was called in Zofran started on a drug holiday.  Patient says his symptoms immediately stopped when he stopped his Esbriet.  He did not have to use any Zofran.  He had no vomiting or diarrhea.  Patient says he is recently restarted back on Esbriet 2 weeks ago.  He is currently on 1 capsule twice daily and is tolerating well.  We discussed going up very slowly on the Esbriet to see if he can tolerate.  Recent lab work showed a slight increase in his ALT at 63.  Patient is on chronic steroids with prednisone 10  mg.  He does have diabetes and is recently been started on Jardiance. Patient had PFTs done May 07, 2022 that showed stable lung function with FEV1 at 43%, ratio 79, FVC 40%, DLCO 24%.  This is similar to September 2023.  Walk test in the office shows stable oxygen demands -require 6 L of oxygen on continuous flow to maintain O2 saturations while walking.  At rest patient says he does not require any oxygen at all cannot sit still without his oxygen on and keep his sats above 90%.  Today in the office O2 saturations at rest was 90% on room air.  As soon as the patient stands up or starts to walk he does require oxygen because his O2 saturations dropped into the 80s.  Patient denies any hemoptysis, chest pain, orthopnea. Patient has completed pulmonary rehab in the past.  Patient says he is not very active at home.  Is somewhat sedentary.  We discussed increasing his activity as tolerated.  Patient does have upcoming surgery at the end of this month for a umbilical hernia repair.   OV 07/21/2022  Subjective:  Patient ID: Brian Casey, male , DOB: 10-Nov-1950 , age 89 y.o. , MRN: [id number] , ADDRESS: [address] [place] [address] PCP Jerl Mina, MD Patient Care Team: Jerl Mina, MD as PCP - General (Family Medicine) Antonieta Iba, MD as PCP - Cardiology (Cardiology) Lovett Sox, MD as Consulting Physician (Cardiothoracic Surgery)  This Provider for this visit: Treatment Team:  Attending Provider: Kalman Shan, MD  Chronic HP  - failed ofev -  GI and weight loss  - prior on immuran Chronic Resp failure 4L [place] Obesity  - goal weight 170-175#    Intolerant to Ofev 2023 Esbriet started February 2024  07/21/2022 -   Chief Complaint  Patient presents with   Follow-up    F/up      HPI JIHAAD BRUSCHI 72 y.o. -returns for follow-up.  He has had further healthy weight loss.  His BMI is now 28.  He did go to [facility] lung transplant clinic.   He says he was declined.  This because of his medical issues.  Meanwhile he overall feels stable.  His wife is sick with needing cardiac bypass.  She also has upcoming aneurysm surgery.  At the time of cardiac bypass because he was taken care of he stopped his pirfenidone.  He has now restarted it on July 18, 2022 and is going to escalated to full dose.  His pulse ox on room air at rest is 88-91%.  I made him do 5 sets of sit-stand in the office on room air and his pulse ox dropped to 79%.  It recovered after 30 seconds.  He feels at baseline.  He is frustrated little bit because he got declined transplant but he is reconciled to that.  He is proud of his weight loss.  He did have his umbilical hernia surgery by Dr. Corliss Skains and I reviewed the notes.  Lab review shows that in March 2024 his LFTs were slightly high with ALT being slightly high at [facility].  He needs a repeat test today.  We discussed his future course with the hope of him being stable and requiring antifibrotic's.  He is worried about hypoglycemia with his prednisone 10 mg he wants to reduce it.  We took a shared decision making to reduce it to 8 mg/day.  We also discussed future care options including clinical trials.  Discussed the ILD-Pro registry.  He is interested in this but currently because of his wife's issues he is not willing to participate.  We discussed inhaled treprostinil in our clinical trial protocol versus placebo.  He is interested in this but again we will have to wait till latter part of the year to enroll in this.  He is going to meet with the research coordinator to look at the consent form.    SYMPTOM SCALE - ILD 11/04/2021 01/13/2022 ofev 03/12/2022 Off ofev 07/21/2022   Current weight  196# 181.8# - eeliugn around same 177# - esbiret   420 feet in July 745 feet afte rehab In Oct 2023    O2 use RA but needing 4L [place] with exertin 4L rest and exertion. Sometimes 6L    Shortness of Breath 0 -> 5 scale with 5  being worst (score 6 If unable to do)     At rest 0 0  0  Simple tasks - showers, clothes change, eating, shaving 4 4  2   Household (dishes, doing bed, laundry) 4 4  3   Shopping 3 4  3   Walking level at own pace 3 4  3   Walking up Stairs 5 5  3   Total (30-36) Dyspnea Score 19 21  14     Non-dyspnea symptoms (0-> 5 scale) 11/04/2021 01/13/2022   07/21/2022   How bad is your cough? 2.5 0  1  How bad is your fatigue  3 3  2   How bad is nausea 0 2  0  How bad is vomiting?  0 0  0  How bad is diarrhea? 0 5+  0.5  How bad is anxiety? 3 4  0  How bad is depression 2 3  0  Any chronic pain - if so where and how bad 0 x  x    Simple office walk 185 feet x  3 laps goal with forehead probe 01/13/2022  Needs walk test  O2 used 4 88-92% RA at rest  Number laps completed Aim ws 3 and start of 2nd lap increased o2   Comments about pace avg   Resting Pulse Ox/HR 100% and 66/min   Final Pulse Ox/HR 86% and 98/min Desat to 79% with 5 sit-stand  Desaturated </= 88% Yes at start of 2n lap   Desaturated <= 3% points yes   Got Tachycardic >/= 90/min yes   Symptoms at end of test Mod doe   Miscellaneous comments 6L [place] finished all 3 laps wth correction     PFT     Latest Ref Rng & Units 05/07/2022    3:38 PM 12/12/2021   10:02 AM 11/11/2015    7:38 AM  PFT Results  FVC-Pre L 1.57  1.41  1.20   FVC-Predicted Pre % 40  35  28   FVC-Post L  1.46  1.35   FVC-Predicted Post %  36  32   Pre FEV1/FVC % % 79  89  60   Post FEV1/FCV % %  89  56   FEV1-Pre L 1.24  1.26  0.72   FEV1-Predicted Pre % 43  43  22   FEV1-Post L  1.30  0.76   DLCO uncorrected ml/min/mmHg 5.76  5.38  8.93   DLCO UNC% % 24  22  31    DLCO corrected ml/min/mmHg  5.38  10.84   DLCO COR %Predicted %  22  38   DLVA Predicted % 63  59  119   TLC L  2.23  2.47   TLC % Predicted %  34  38   RV % Predicted %  33  61        has a past medical history of Adenomatous colon polyp, Anxiety, Aortic root dilatation (HCC),  Arthritis, CAD (coronary artery disease), CKD (chronic kidney disease), stage III (HCC), Depression, Diastolic dysfunction, Diverticulosis, GERD (gastroesophageal reflux disease), High cholesterol, History of bronchitis, History of Hyperthyroidism, Hypertension (10/17/2013), Hypothyroidism, ILD (interstitial lung disease) (HCC), PAD (peripheral artery disease) (HCC), Post-op atrial fibrillation (HCC) (11/06/2015), Shortness of breath, Type II diabetes mellitus (HCC), and Wears glasses.   reports that he quit smoking about 6 years ago. His smoking use included cigarettes. He has a 48.00 pack-year smoking history. He has never used smokeless tobacco.  Past Surgical History:  Procedure Laterality Date   CARDIAC CATHETERIZATION Right 11/06/2015   Procedure: Left Heart Cath and Coronary Angiography;  Surgeon: Antonieta Iba, MD;  Location: [facility];  Service: Cardiovascular;  Laterality: Right;   CHOLECYSTECTOMY N/A 01/17/2016   Procedure: LAPAROSCOPIC CHOLECYSTECTOMY WITH INTRAOPERATIVE CHOLANGIOGRAM;  Surgeon: Claud Kelp, MD;  Location: [facility] OR;  Service: General;  Laterality: N/A;   COLONOSCOPY     COLONOSCOPY N/A 11/19/2021   Procedure: COLONOSCOPY;  Surgeon: Toledo, Boykin Nearing, MD;  Location: [facility];  Service: Gastroenterology;  Laterality: N/A;   CORONARY ARTERY BYPASS GRAFT N/A 11/14/2015   Procedure: CORONARY ARTERY BYPASS GRAFTING (CABG) x 4;  Surgeon: Kerin Perna, MD;  Location: [facility] OR;  Service: Open Heart Surgery;  Laterality: N/A;   ENDARTERECTOMY Left 11/14/2015   Procedure: LEFT ENDARTERECTOMY CAROTID;  Surgeon: Maeola Harman, MD;  Location: [facility] OR;  Service: Vascular;  Laterality: Left;   ESOPHAGOGASTRODUODENOSCOPY N/A 11/19/2021   Procedure: ESOPHAGOGASTRODUODENOSCOPY (EGD);  Surgeon: Toledo, Boykin Nearing, MD;  Location: [facility];  Service: Gastroenterology;  Laterality: N/A;   EXCISION OF MESH N/A 06/18/2022   Procedure: EXCISION OF MESH;  Surgeon:  Manus Rudd, MD;  Location: [facility] OR;  Service: General;  Laterality: N/A;   FINGER GANGLION CYST EXCISION Left X 3   "index finger X 2; thumb X 1"   HEMORRHOID SURGERY  1982   HERNIA REPAIR     INSERTION OF MESH N/A 11/16/2013   Procedure: INSERTION OF MESH;  Surgeon: Wilmon Arms. Corliss Skains, MD;  Location: [facility] OR;  Service: General;  Laterality: N/A;   IR GENERIC HISTORICAL  11/08/2015   IR PERC CHOLECYSTOSTOMY 11/08/2015 Gilmer Mor, DO [facility]   IR GENERIC HISTORICAL  12/13/2015   IR CHOLANGIOGRAM EXISTING TUBE 12/13/2015 [facility]   IR GENERIC HISTORICAL  12/25/2015   IR CATHETER TUBE CHANGE 12/25/2015 Simonne Come, MD [facility]   IR GENERIC HISTORICAL  12/12/2015   IR RADIOLOGIST EVAL & MGMT 12/12/2015 Berdine Dance, MD [facility]   RIGHT HEART CATH Right 02/03/2022   Procedure: RIGHT HEART CATH;  Surgeon: Yvonne Kendall, MD;  Location: [facility];  Service: Cardiovascular;  Laterality: Right;   TEE WITHOUT CARDIOVERSION N/A 11/14/2015   Procedure: TRANSESOPHAGEAL ECHOCARDIOGRAM (TEE);  Surgeon: Kerin Perna, MD;  Location: [facility] OR;  Service: Open Heart Surgery;  Laterality: N/A;   UMBILICAL HERNIA REPAIR N/A 11/16/2013   Procedure: UMBILICAL HERNIA REPAIR;  Surgeon: Wilmon Arms. Corliss Skains, MD;  Location: [facility] OR;  Service: General;  Laterality: N/A;   UMBILICAL HERNIA REPAIR N/A 06/18/2022   Procedure: OPEN UMBILICAL HERNIA REPAIR;  Surgeon: Manus Rudd, MD;  Location: [facility] OR;  Service: General;  Laterality: N/A;   VEIN HARVEST Right 11/14/2015   Procedure: RIGHT LEG GREATER SAPHENOUS VEIN HARVEST;  Surgeon: Kerin Perna, MD;  Location: [facility] OR;  Service: Open Heart Surgery;  Laterality: Right;    Allergies  Allergen Reactions   Penicillins Nausea And Vomiting and Other (See Comments)    REACTION: Vomiting Has patient had a PCN reaction causing immediate rash, facial/tongue/throat swelling, SOB or lightheadedness with hypotension: Yes Has patient had a PCN reaction  causing severe rash involving mucus membranes or skin necrosis: No Has patient had a PCN reaction that required hospitalization No Has patient had a PCN reaction occurring within the last 10 years: No If all of the above answers are "NO", then may proceed with Cephalosporin use.   Amlodipine Swelling   Bupropion Hcl     REACTION: Severe constipation, insomnia   Chlorthalidone Other (See Comments)    Other reaction(s): Hyponatremia   Metformin Diarrhea   Nintedanib Diarrhea and Other (See Comments)   Terazosin Other (See Comments)    Immunization History  Administered Date(s) Administered   Fluad Quad(high Dose 65+) 01/13/2022   Influenza Inj Mdck Quad Pf 12/25/2015   Influenza Split 01/26/2011, 02/10/2012   Influenza Whole 01/22/2004, 02/05/2008, 12/26/2008   Influenza,inj,Quad PF,6+ Mos 12/29/2012, 01/10/2014, 12/11/2014   Influenza-Unspecified 12/25/2016, 12/22/2017, 12/21/2018, 12/07/2019, 12/24/2020   PFIZER Comirnaty(Gray Top)Covid-19 Tri-Sucrose Vaccine 04/09/2019, 05/02/2019, 12/28/2019, 01/01/2021   Pneumococcal Conjugate-13 03/29/2014, 11/06/2014, 11/15/2017   Pneumococcal Polysaccharide-23 11/05/2016  RSV,unspecified 02/19/2022   Td 08/22/1998, 08/18/2013   Unspecified SARS-COV-2 Vaccination 02/20/2022   Zoster, Live 03/29/2014    Family History  Problem Relation Age of Onset   Hypertension Mother    Diabetes Mother    Depression Mother    Colon cancer Mother    Hyperthyroidism Father    Hypertension Father    Prostate cancer Father    Diabetes Maternal Grandmother    Lung cancer Maternal Grandmother        non smoker     Current Outpatient Medications:    aspirin EC 81 MG tablet, Take 81 mg by mouth daily. Swallow whole., Disp: , Rfl:    atorvastatin (LIPITOR) 40 MG tablet, Take 40 mg by mouth daily., Disp: , Rfl:    carvedilol (COREG) 12.5 MG tablet, Take 12.5 mg by mouth 2 (two) times daily with a meal., Disp: , Rfl:    empagliflozin (JARDIANCE) 10 MG  TABS tablet, Take 10 mg by mouth daily., Disp: , Rfl:    furosemide (LASIX) 20 MG tablet, Take 20 mg by mouth daily., Disp: , Rfl:    levothyroxine (SYNTHROID) 100 MCG tablet, Take 100 mcg by mouth daily before breakfast., Disp: , Rfl:    loperamide (IMODIUM) 2 MG capsule, Take 2 mg by mouth as needed for diarrhea or loose stools., Disp: , Rfl:    omeprazole (PRILOSEC) 20 MG capsule, Take 1 capsule (20 mg total) by mouth daily. OFFICE VISIT REQUIRED FOR ADDITIONAL REFILLS, Disp: 30 capsule, Rfl: 0   ondansetron (ZOFRAN) 4 MG tablet, Take 1 tablet (4 mg total) by mouth every 8 (eight) hours as needed for nausea or vomiting., Disp: 20 tablet, Rfl: 3   oxyCODONE (OXY IR/ROXICODONE) 5 MG immediate release tablet, Take 1 tablet (5 mg total) by mouth every 6 (six) hours as needed for severe pain., Disp: 20 tablet, Rfl: 0   Pirfenidone 267 MG TABS, Month 1: Take 1 tab three times daily for 7 days, then 2 tabs three times daily for 7 days, then 3 tabs three times daily thereafter., Disp: 270 tablet, Rfl: 0   potassium chloride (KLOR-CON) 10 MEQ tablet, Take 10 mEq by mouth daily., Disp: , Rfl:    predniSONE (DELTASONE) 10 MG tablet, TAKE 1 TABLET DAILY WITH   BREAKFAST, Disp: 90 tablet, Rfl: 0   tadalafil (CIALIS) 10 MG tablet, Take 10 mg by mouth daily as needed for erectile dysfunction., Disp: , Rfl:       Objective:   Vitals:   07/21/22 1305  BP: (!) 140/60  Pulse: (!) 48  SpO2: 94%  Weight: 177 lb 6.4 oz (80.5 kg)  Height: 5' 6.5" (1.689 m)    Estimated body mass index is 28.2 kg/m as calculated from the following:   Height as of this encounter: 5' 6.5" (1.689 m).   Weight as of this encounter: 177 lb 6.4 oz (80.5 kg).  @WEIGHTCHANGE @  [employer]   07/21/22 1305  Weight: 177 lb 6.4 oz (80.5 kg)     Physical Exam    General: No distress. Looks wel Neuro: Alert and Oriented x 3. GCS 15. Speech normal Psych: Pleasant Resp:  Barrel Chest - no.  Wheeze - no, Crackles - some, No  overt respiratory distress CVS: Normal heart sounds. Murmurs - no Ext: Stigmata of Connective Tissue Disease - no HEENT: Normal upper airway. PEERL +. No post nasal drip        Assessment:       ICD-10-CM   1.  ILD (interstitial lung disease) (HCC)  J84.9     2. Chronic respiratory failure with hypoxia (HCC)  J96.11     3. High risk medication use  Z79.899     4. Hypersensitivity pneumonitis (HCC)  J67.9     5. Current chronic use of systemic steroids  Z79.52          Plan:     Patient Instructions     ICD-10-CM   1. Chronic respiratory failure with hypoxia (HCC)  J96.11     2. Hypersensitivity pneumonitis (HCC)  J67.9     3. Current chronic use of systemic steroids  Z79.52     4. History of influenza  Z87.09     5. 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 #Chronic prednisonuese #OFev intolerance  - stable clinically  - pulse ox 88% RA at rest - contrast on weight loss - too bad [facility] declined transplant -Tolerating 10 mg of prednisone daily but causing high sugars  - CMA to reduce prednisone to 8mg  per day -Liver function test normal dec 2023 but elevatged at [facility] March 2024  - check LFT 07/21/2022  - Glad you restarted esbriet 07/18/22   PLAN - STart esbriet per protocol and increase per protocol  - through [facility] -Continue maintenance pulmonary rehabilitation  -Continue 4 L -6L N nasal cannula with exertion; goal pulse ox greater than 86% - spirometry and dlco in BRL clinic in 8-10 weeks - consider clinical trials - take consent for  - ILD-PRO registry  - Tyvaso trials   #Obesity with a BMI greater than 30 and hyperglycemia  - Glad there is weight loss - BMI now 28   Plan  - conitue weight  loss to around 160-165#    Follow-up - Return to see Dr. Marchelle Gearing)( 30-minute  visit in less than 10 weeks     SIGNATURE    Dr. Kalman Shan, M.D., F.C.C.P,  Pulmonary and Critical Care Medicine Staff Physician, [facility] Director - Interstitial Lung Disease  Program  Pulmonary Fibrosis [facility] [place], [place], [address]  Pager: [phone], If no answer or between  15:00h - 7:00h: call [phone] Telephone: [phone]  1:38 PM 07/21/2022

## 2022-07-21 NOTE — Patient Instructions (Addendum)
ICD-10-CM   1. Chronic respiratory failure with hypoxia (HCC)  J96.11     2. Hypersensitivity pneumonitis (HCC)  J67.9     3. Current chronic use of systemic steroids  Z79.52     4. History of influenza  Z87.09     5. 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 #Chronic prednisonuese #OFev intolerance  - stable clinically  - pulse ox 88% RA at rest - contrast on weight loss - too bad [facility] declined transplant -Tolerating 10 mg of prednisone daily but causing high sugars  - CMA to reduce prednisone to 8mg  per day -Liver function test normal dec 2023 but elevatged at [facility] March 2024  - check LFT 07/21/2022  - Glad you restarted esbriet 07/18/22   PLAN - STart esbriet per protocol and increase per protocol  - through [facility] -Continue maintenance pulmonary rehabilitation  -Continue 4 L -6L N nasal cannula with exertion; goal pulse ox greater than 86% - spirometry and dlco in BRL clinic in 8-10 weeks - consider clinical trials - take consent for  - ILD-PRO registry  - Tyvaso trials   #Obesity with a BMI greater than 30 and hyperglycemia  - Glad there is weight loss - BMI now 28   Plan  - conitue weight  loss to around 160-165#    Follow-up - Return to see Dr. Marchelle Gearing)( 30-minute visit in less than 10 weeks

## 2022-07-28 ENCOUNTER — Encounter: Payer: Self-pay | Admitting: Internal Medicine

## 2022-07-28 DIAGNOSIS — J849 Interstitial pulmonary disease, unspecified: Secondary | ICD-10-CM

## 2022-07-28 MED ORDER — PIRFENIDONE 267 MG PO TABS
801.0000 mg | ORAL_TABLET | Freq: Three times a day (TID) | ORAL | 1 refills | Status: DC
Start: 2022-07-28 — End: 2022-07-31

## 2022-07-31 MED ORDER — PIRFENIDONE 267 MG PO TABS
801.0000 mg | ORAL_TABLET | Freq: Three times a day (TID) | ORAL | 1 refills | Status: DC
Start: 2022-07-31 — End: 2022-09-16

## 2022-07-31 NOTE — Addendum Note (Signed)
Addended by: Murrell Redden on: 07/31/2022 12:50 PM   Modules accepted: Orders

## 2022-09-02 ENCOUNTER — Encounter: Payer: Self-pay | Admitting: Ophthalmology

## 2022-09-02 ENCOUNTER — Ambulatory Visit: Payer: Medicare HMO | Admitting: Urology

## 2022-09-02 NOTE — Anesthesia Preprocedure Evaluation (Addendum)
Anesthesia Evaluation  Patient identified by MRN, date of birth, ID band Patient awake    Reviewed: Allergy & Precautions, NPO status , Patient's Chart, lab work & pertinent test results  History of Anesthesia Complications Negative for: history of anesthetic complications  Airway Mallampati: III  TM Distance: >3 FB Neck ROM: Full    Dental  (+) Dental Advisory Given   Pulmonary shortness of breath, at rest and Long-Term Oxygen Therapy, former smoker ILD with 5L O2 per min, 2L/min at night    + decreased breath sounds      Cardiovascular hypertension, Pt. on medications and Pt. on home beta blockers + angina  + CAD, + Past MI, + Peripheral Vascular Disease and + PND   Rhythm:Regular Rate:Normal  Echo 11-12-21  1. Left ventricular ejection fraction, by estimation, is 60 to 65%. The  left ventricle has normal function. The left ventricle has no regional  wall motion abnormalities. There is mild left ventricular hypertrophy.  Left ventricular diastolic parameters  are consistent with Grade I diastolic dysfunction (impaired relaxation).   2. Right ventricular systolic function is normal. The right ventricular  size is normal.   3. The mitral valve is normal in structure. No evidence of mitral valve  regurgitation.   4. The aortic valve is tricuspid. Aortic valve regurgitation is not  visualized. Aortic valve sclerosis is present, with no evidence of aortic  valve stenosis.   5. Aortic dilatation noted. There is mild dilatation of the aortic root,  measuring 39 mm.   6. The inferior vena cava is normal in size with greater than 50%  respiratory variability, suggesting right atrial pressure of 3 mmHg.   Cath 111-14-23 1. Normal left and right heart filling pressures. 2. Mild pulmonary hypertension (mean PAP 21 mmHg). 3. Normal Fick cardiac output and low normal to mildly reduced thermodilution cardiac output.   Bilateral carotid  artery stenosis, unspecified   Neuro/Psych  PSYCHIATRIC DISORDERS Anxiety Depression     Neuromuscular disease    GI/Hepatic ,GERD  ,,  Endo/Other  diabetes, Type 2Hypothyroidism    Renal/GU CRFRenal diseaseLab Results      Component                Value               Date                      CREATININE               1.40 (H)            06/11/2022                Musculoskeletal  (+) Arthritis ,    Abdominal   Peds  Hematology negative hematology ROS (+) Lab Results      Component                Value               Date                      WBC                      11.0 (H)            06/11/2022                HGB  15.3                06/11/2022                HCT                      42.9                06/11/2022                MCV                      96.0                06/11/2022                PLT                      203                 06/11/2022              Anesthesia Other Findings GERD (gastroesophageal reflux disease) Hypertension High cholesterol  CAD (coronary artery disease) Post-op atrial fibrillation (HCC) PAF CKD (chronic kidney disease), stage III  Hypothyroidism  Type II diabetes mellitus (HCC) Arthritis  History of bronchitis Wears glasses  Adenomatous colon polyp Diverticulosis  ILD (interstitial lung disease) (HCC) Diastolic dysfunction  Aortic root dilatation (HCC) Shortness of breath  PAD (peripheral artery disease) (HCC) Interstitial lung disease Chronic respiratory failure with hypoxia Hx NSTEMI  Hx CABG, 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     Reproductive/Obstetrics                             Anesthesia Physical Anesthesia Plan  ASA: 4  Anesthesia Plan: MAC   Post-op Pain Management:    Induction: Intravenous  PONV Risk Score and Plan: 1 and Midazolam  Airway Management Planned: Nasal Cannula  Additional Equipment:   Intra-op Plan:   Post-operative Plan:   Informed Consent: I have  reviewed the patients History and Physical, chart, labs and discussed the procedure including the risks, benefits and alternatives for the proposed anesthesia with the patient or authorized representative who has indicated his/her understanding and acceptance.       Plan Discussed with: CRNA and Surgeon  Anesthesia Plan Comments: (Explained risks of anesthesia, including PONV, and rare emergencies such as cardiac events, respiratory problems, and allergic reactions, requiring invasive intervention. Discussed the role of CRNA in patient's perioperative care. Patient understands. Patient says he can lay flat. )       Anesthesia Quick Evaluation

## 2022-09-07 NOTE — Discharge Instructions (Signed)

## 2022-09-09 ENCOUNTER — Ambulatory Visit: Payer: No Typology Code available for payment source | Admitting: Anesthesiology

## 2022-09-09 ENCOUNTER — Encounter: Payer: Self-pay | Admitting: Ophthalmology

## 2022-09-09 ENCOUNTER — Ambulatory Visit
Admission: RE | Admit: 2022-09-09 | Discharge: 2022-09-09 | Disposition: A | Discharge status: Home / Self Care | Payer: No Typology Code available for payment source | Source: Ambulatory Visit | Attending: Ophthalmology | Admitting: Ophthalmology

## 2022-09-09 ENCOUNTER — Encounter
Admission: RE | Disposition: A | Discharge status: Home / Self Care | Payer: Self-pay | Source: Ambulatory Visit | Attending: Ophthalmology

## 2022-09-09 ENCOUNTER — Other Ambulatory Visit: Payer: Self-pay

## 2022-09-09 DIAGNOSIS — J849 Interstitial pulmonary disease, unspecified: Secondary | ICD-10-CM | POA: Diagnosis not present

## 2022-09-09 DIAGNOSIS — E7800 Pure hypercholesterolemia, unspecified: Secondary | ICD-10-CM | POA: Insufficient documentation

## 2022-09-09 DIAGNOSIS — E1151 Type 2 diabetes mellitus with diabetic peripheral angiopathy without gangrene: Secondary | ICD-10-CM | POA: Diagnosis not present

## 2022-09-09 DIAGNOSIS — I252 Old myocardial infarction: Secondary | ICD-10-CM | POA: Insufficient documentation

## 2022-09-09 DIAGNOSIS — E1136 Type 2 diabetes mellitus with diabetic cataract: Secondary | ICD-10-CM | POA: Insufficient documentation

## 2022-09-09 DIAGNOSIS — E1122 Type 2 diabetes mellitus with diabetic chronic kidney disease: Secondary | ICD-10-CM | POA: Insufficient documentation

## 2022-09-09 DIAGNOSIS — K219 Gastro-esophageal reflux disease without esophagitis: Secondary | ICD-10-CM | POA: Insufficient documentation

## 2022-09-09 DIAGNOSIS — Z09 Encounter for follow-up examination after completed treatment for conditions other than malignant neoplasm: Secondary | ICD-10-CM | POA: Insufficient documentation

## 2022-09-09 DIAGNOSIS — Z9981 Dependence on supplemental oxygen: Secondary | ICD-10-CM | POA: Diagnosis not present

## 2022-09-09 DIAGNOSIS — H2511 Age-related nuclear cataract, right eye: Secondary | ICD-10-CM | POA: Diagnosis present

## 2022-09-09 DIAGNOSIS — E039 Hypothyroidism, unspecified: Secondary | ICD-10-CM | POA: Insufficient documentation

## 2022-09-09 DIAGNOSIS — Z7984 Long term (current) use of oral hypoglycemic drugs: Secondary | ICD-10-CM | POA: Insufficient documentation

## 2022-09-09 DIAGNOSIS — M1990 Unspecified osteoarthritis, unspecified site: Secondary | ICD-10-CM | POA: Insufficient documentation

## 2022-09-09 DIAGNOSIS — I129 Hypertensive chronic kidney disease with stage 1 through stage 4 chronic kidney disease, or unspecified chronic kidney disease: Secondary | ICD-10-CM | POA: Diagnosis not present

## 2022-09-09 DIAGNOSIS — I2510 Atherosclerotic heart disease of native coronary artery without angina pectoris: Secondary | ICD-10-CM | POA: Diagnosis not present

## 2022-09-09 DIAGNOSIS — N183 Chronic kidney disease, stage 3 unspecified: Secondary | ICD-10-CM | POA: Insufficient documentation

## 2022-09-09 DIAGNOSIS — I272 Pulmonary hypertension, unspecified: Secondary | ICD-10-CM | POA: Diagnosis not present

## 2022-09-09 DIAGNOSIS — Z951 Presence of aortocoronary bypass graft: Secondary | ICD-10-CM | POA: Insufficient documentation

## 2022-09-09 DIAGNOSIS — Z87891 Personal history of nicotine dependence: Secondary | ICD-10-CM | POA: Insufficient documentation

## 2022-09-09 HISTORY — DX: Pulmonary hypertension, unspecified: I27.20

## 2022-09-09 HISTORY — DX: Chronic respiratory failure with hypoxia: J96.11

## 2022-09-09 HISTORY — DX: Other ill-defined heart diseases: I51.89

## 2022-09-09 HISTORY — PX: CATARACT EXTRACTION W/PHACO: SHX586

## 2022-09-09 LAB — GLUCOSE, CAPILLARY: Glucose-Capillary: 152 mg/dL — ABNORMAL HIGH (ref 70–99)

## 2022-09-09 SURGERY — PHACOEMULSIFICATION, CATARACT, WITH IOL INSERTION
Anesthesia: Monitor Anesthesia Care | Laterality: Right

## 2022-09-09 MED ORDER — FENTANYL CITRATE (PF) 100 MCG/2ML IJ SOLN
INTRAMUSCULAR | Status: DC | PRN
  Administered 2022-09-09: 50 ug via INTRAVENOUS

## 2022-09-09 MED ORDER — ARMC OPHTHALMIC DILATING DROPS
1.0000 | OPHTHALMIC | Status: DC | PRN
  Administered 2022-09-09 (×3): 1 via OPHTHALMIC

## 2022-09-09 MED ORDER — LACTATED RINGERS IV SOLN: INTRAVENOUS | Status: DC

## 2022-09-09 MED ORDER — MIDAZOLAM HCL 2 MG/2ML IJ SOLN
INTRAMUSCULAR | Status: DC | PRN
  Administered 2022-09-09 (×2): 1 mg via INTRAVENOUS

## 2022-09-09 MED ORDER — MOXIFLOXACIN HCL 0.5 % OP SOLN
OPHTHALMIC | Status: DC | PRN
  Administered 2022-09-09: .2 mL via OPHTHALMIC

## 2022-09-09 MED ORDER — PHENYLEPHRINE-KETOROLAC 1-0.3 % IO SOLN
INTRAOCULAR | Status: DC | PRN
  Administered 2022-09-09: 73 mL via OPHTHALMIC

## 2022-09-09 MED ORDER — TETRACAINE HCL 0.5 % OP SOLN
1.0000 [drp] | OPHTHALMIC | Status: DC | PRN
  Administered 2022-09-09 (×3): 1 [drp] via OPHTHALMIC

## 2022-09-09 MED ORDER — SIGHTPATH DOSE#1 BSS IO SOLN
INTRAOCULAR | Status: DC | PRN
  Administered 2022-09-09: 15 mL via INTRAOCULAR

## 2022-09-09 MED ORDER — LIDOCAINE HCL (PF) 2 % IJ SOLN
INTRAOCULAR | Status: DC | PRN
  Administered 2022-09-09: 4 mL via INTRAOCULAR

## 2022-09-09 MED ORDER — SIGHTPATH DOSE#1 NA HYALUR & NA CHOND-NA HYALUR IO KIT
PACK | INTRAOCULAR | Status: DC | PRN
  Administered 2022-09-09: 1 via OPHTHALMIC

## 2022-09-09 MED ORDER — BRIMONIDINE TARTRATE-TIMOLOL 0.2-0.5 % OP SOLN
OPHTHALMIC | Status: DC | PRN
  Administered 2022-09-09: 1 [drp] via OPHTHALMIC

## 2022-09-09 SURGICAL SUPPLY — 9 items
CATARACT SUITE SIGHTPATH (MISCELLANEOUS) ×1 IMPLANT
DISSECTOR HYDRO NUCLEUS 50X22 (MISCELLANEOUS) ×1 IMPLANT
DRSG TEGADERM 2-3/8X2-3/4 SM (GAUZE/BANDAGES/DRESSINGS) ×1 IMPLANT
FEE CATARACT SUITE SIGHTPATH (MISCELLANEOUS) ×1 IMPLANT
GLOVE SURG SYN 7.5 E (GLOVE) ×1 IMPLANT
GLOVE SURG SYN 7.5 PF PI (GLOVE) ×1 IMPLANT
GLOVE SURG SYN 8.5 E (GLOVE) ×1 IMPLANT
GLOVE SURG SYN 8.5 PF PI (GLOVE) ×1 IMPLANT
LENS IOL TECNIS EYHANCE 24.5 (Intraocular Lens) IMPLANT

## 2022-09-09 NOTE — H&P (Signed)
[facility]   Primary Care Physician:  Jerl Mina, MD Ophthalmologist: Dr. Deberah Pelton  Pre-Procedure History & Physical: HPI:  Brian Casey is a 72 y.o. male here for cataract surgery.   Past Medical History:  Diagnosis Date   Adenomatous colon polyp    Aortic root dilatation (HCC)    Noted on echo in August 2023.  Measured 39 mm.   Arthritis    "lower spine; fingers" (11/07/2015)   CAD (coronary artery disease)    a. 10/2015 NSTEMI/Cath: LM 40, LAD 70p, LCX 90ost, RI 90ost, RCA 60-70p, 90-34m/d, 40-50d; b. 10/2015 CABG x 4: LIMA->LAD, VG->Diag, VG->RI, VG->RPDA.   Chronic respiratory failure with hypoxia (HCC)    CKD (chronic kidney disease), stage III (HCC)    Diastolic dysfunction    a. 10/2021 Echo: EF 60-65%,   Diverticulosis    GERD (gastroesophageal reflux disease)    Grade I diastolic dysfunction    High cholesterol    History of bronchitis    Hypertension 10/17/2013   Hypothyroidism    ILD (interstitial lung disease) (HCC)    Mild pulmonary hypertension (HCC)    PAD (peripheral artery disease) (HCC)    s/p left carotid endarterectomy   Post-op atrial fibrillation (HCC) 11/06/2015   Shortness of breath    Type II diabetes mellitus (HCC)    type II   Wears glasses     Past Surgical History:  Procedure Laterality Date   CARDIAC CATHETERIZATION Right 11/06/2015   Procedure: Left Heart Cath and Coronary Angiography;  Surgeon: Antonieta Iba, MD;  Location: [facility] INVASIVE CV [facility];  Service: Cardiovascular;  Laterality: Right;   CHOLECYSTECTOMY N/A 01/17/2016   Procedure: LAPAROSCOPIC CHOLECYSTECTOMY WITH INTRAOPERATIVE CHOLANGIOGRAM;  Surgeon: Claud Kelp, MD;  Location: [facility] OR;  Service: General;  Laterality: N/A;   COLONOSCOPY     COLONOSCOPY N/A 11/19/2021   Procedure: COLONOSCOPY;  Surgeon: Toledo, Boykin Nearing, MD;  Location: [facility] ENDOSCOPY;  Service: Gastroenterology;  Laterality: N/A;   CORONARY ARTERY BYPASS GRAFT N/A 11/14/2015   Procedure:  CORONARY ARTERY BYPASS GRAFTING (CABG) x 4;  Surgeon: Kerin Perna, MD;  Location: [facility] OR;  Service: Open Heart Surgery;  Laterality: N/A;   ENDARTERECTOMY Left 11/14/2015   Procedure: LEFT ENDARTERECTOMY CAROTID;  Surgeon: Maeola Harman, MD;  Location: [facility] OR;  Service: Vascular;  Laterality: Left;   ESOPHAGOGASTRODUODENOSCOPY N/A 11/19/2021   Procedure: ESOPHAGOGASTRODUODENOSCOPY (EGD);  Surgeon: Toledo, Boykin Nearing, MD;  Location: [facility] ENDOSCOPY;  Service: Gastroenterology;  Laterality: N/A;   EXCISION OF MESH N/A 06/18/2022   Procedure: EXCISION OF MESH;  Surgeon: Manus Rudd, MD;  Location: [facility] OR;  Service: General;  Laterality: N/A;   FINGER GANGLION CYST EXCISION Left X 3   "index finger X 2; thumb X 1"   HEMORRHOID SURGERY  1982   HERNIA REPAIR     INSERTION OF MESH N/A 11/16/2013   Procedure: INSERTION OF MESH;  Surgeon: Wilmon Arms. Corliss Skains, MD;  Location: [facility] OR;  Service: General;  Laterality: N/A;   IR GENERIC HISTORICAL  11/08/2015   IR PERC CHOLECYSTOSTOMY 11/08/2015 Gilmer Mor, DO [facility]   IR GENERIC HISTORICAL  12/13/2015   IR CHOLANGIOGRAM EXISTING TUBE 12/13/2015 [facility]   IR GENERIC HISTORICAL  12/25/2015   IR CATHETER TUBE CHANGE 12/25/2015 Simonne Come, MD [facility]   IR GENERIC HISTORICAL  12/12/2015   IR RADIOLOGIST EVAL & MGMT 12/12/2015 Berdine Dance, MD [facility]   RIGHT HEART CATH Right 02/03/2022   Procedure:  RIGHT HEART CATH;  Surgeon: Yvonne Kendall, MD;  Location: [facility] CV [facility];  Service: Cardiovascular;  Laterality: Right;   TEE WITHOUT CARDIOVERSION N/A 11/14/2015   Procedure: TRANSESOPHAGEAL ECHOCARDIOGRAM (TEE);  Surgeon: Kerin Perna, MD;  Location: [facility] OR;  Service: Open Heart Surgery;  Laterality: N/A;   UMBILICAL HERNIA REPAIR N/A 11/16/2013   Procedure: UMBILICAL HERNIA REPAIR;  Surgeon: Wilmon Arms. Corliss Skains, MD;  Location: [facility] OR;  Service: General;  Laterality: N/A;   UMBILICAL HERNIA REPAIR N/A 06/18/2022   Procedure:  OPEN UMBILICAL HERNIA REPAIR;  Surgeon: Manus Rudd, MD;  Location: [facility] OR;  Service: General;  Laterality: N/A;   VEIN HARVEST Right 11/14/2015   Procedure: RIGHT LEG GREATER SAPHENOUS VEIN HARVEST;  Surgeon: Kerin Perna, MD;  Location: [facility] OR;  Service: Open Heart Surgery;  Laterality: Right;    Prior to Admission medications   Medication Sig Start Date End Date Taking? Authorizing Provider  aspirin EC 81 MG tablet Take 81 mg by mouth daily. Swallow whole.   Yes [provider]  atorvastatin (LIPITOR) 40 MG tablet Take 40 mg by mouth daily.   Yes [provider]  carvedilol (COREG) 12.5 MG tablet Take 12.5 mg by mouth 2 (two) times daily with a meal.   Yes [provider]  empagliflozin (JARDIANCE) 10 MG TABS tablet Take 25 mg by mouth daily. 06/01/22  Yes [provider]  furosemide (LASIX) 20 MG tablet Take 20 mg by mouth daily. 02/09/21  Yes [provider]  loperamide (IMODIUM) 2 MG capsule Take 2 mg by mouth as needed for diarrhea or loose stools. 01/26/22  Yes [provider]  omeprazole (PRILOSEC) 20 MG capsule Take 1 capsule (20 mg total) by mouth daily. OFFICE VISIT REQUIRED FOR ADDITIONAL REFILLS 10/07/15  Yes Dianne Dun, MD  Pirfenidone 267 MG TABS Take 3 tablets (801 mg total) by mouth 3 (three) times daily with meals. 07/31/22  Yes Kalman Shan, MD  potassium chloride (KLOR-CON) 10 MEQ tablet Take 10 mEq by mouth daily. 04/14/22  Yes [provider]  predniSONE (DELTASONE) 1 MG tablet Take 8 tablets (8 mg total) by mouth daily with breakfast. 07/21/22  Yes Kalman Shan, MD  tadalafil (CIALIS) 10 MG tablet Take 10 mg by mouth daily as needed for erectile dysfunction. 09/05/19  Yes [provider]  levothyroxine (SYNTHROID) 100 MCG tablet Take 100 mcg by mouth daily before breakfast. 07/22/21 07/22/22  [provider]  ondansetron (ZOFRAN) 4 MG tablet Take 1 tablet (4 mg total) by mouth every 8  (eight) hours as needed for nausea or vomiting. 05/20/22   Parrett, Virgel Bouquet, NP  oxyCODONE (OXY IR/ROXICODONE) 5 MG immediate release tablet Take 1 tablet (5 mg total) by mouth every 6 (six) hours as needed for severe pain. Patient not taking: Reported on 09/02/2022 06/18/22   Manus Rudd, MD    Allergies as of 08/14/2022 - Review Complete 07/21/2022  Allergen Reaction Noted   Penicillins Nausea And Vomiting and Other (See Comments)    Amlodipine Swelling 06/06/2018   Bupropion hcl     Chlorthalidone Other (See Comments) 07/20/2018   Metformin Diarrhea 07/20/2018   Nintedanib Diarrhea and Other (See Comments) 02/23/2022   Terazosin Other (See Comments) 09/05/2019    Family History  Problem Relation Age of Onset   Hypertension Mother    Diabetes Mother    Depression Mother    Colon cancer Mother    Hyperthyroidism Father    Hypertension Father  Prostate cancer Father    Diabetes Maternal Grandmother    Lung cancer Maternal Grandmother        non smoker    Social History   Socioeconomic History   Marital status: Married    Spouse name: Arline Asp   Number of children: 2   Years of education: Not on file   Highest education level: Not on file  Occupational History   Occupation: Risk manager,    Employer: [employer]   Occupation: Geneticist, molecular  Tobacco Use   Smoking status: Former    Packs/day: 1.00    Years: 48.00    Additional pack years: 0.00    Total pack years: 48.00    Types: Cigarettes    Quit date: 11/07/2015    Years since quitting: 6.8   Smokeless tobacco: Never  Vaping Use   Vaping Use: Never used  Substance and Sexual Activity   Alcohol use: Yes    Alcohol/week: 3.0 standard drinks of alcohol    Types: 3 Cans of beer per week    Comment: occas   Drug use: No   Sexual activity: Yes  Other Topics Concern   Not on file  Social History Narrative   Not on file   Social Determinants of Health   Financial Resource Strain: Not  on file  Food Insecurity: Not on file  Transportation Needs: Not on file  Physical Activity: Not on file  Stress: Not on file  Social Connections: Not on file  Intimate Partner Violence: Not on file    Review of Systems: See HPI, otherwise negative ROS  Physical Exam: Ht 5' 6.5" (1.689 m)   Wt 77.6 kg   BMI 27.19 kg/m  General:   Alert, cooperative in NAD Head:  Normocephalic and atraumatic. Respiratory:  Normal work of breathing. Cardiovascular:  RRR  Impression/Plan: Brian Casey is here for cataract surgery.  Risks, benefits, limitations, and alternatives regarding cataract surgery have been reviewed with the patient.  Questions have been answered.  All parties agreeable.   Estanislado Pandy, MD  09/09/2022, 7:03 AM

## 2022-09-09 NOTE — Transfer of Care (Signed)
Immediate Anesthesia Transfer of Care Note  Patient: Brian Casey  Procedure(s) Performed: CATARACT EXTRACTION PHACO AND INTRAOCULAR LENS PLACEMENT (IOC) RIGHT DIABETIC OMIDRIA 11.17 01:06.5 (Right)  Patient Location: PACU  Anesthesia Type: MAC  Level of Consciousness: awake, alert  and patient cooperative  Airway and Oxygen Therapy: Patient Spontanous Breathing and Patient connected to supplemental oxygen  Post-op Assessment: Post-op Vital signs reviewed, Patient's Cardiovascular Status Stable, Respiratory Function Stable, Patent Airway and No signs of Nausea or vomiting  Post-op Vital Signs: Reviewed and stable  Complications: No notable events documented.

## 2022-09-09 NOTE — Op Note (Signed)
OPERATIVE NOTE  Brian Casey [id number] 09/09/2022   PREOPERATIVE DIAGNOSIS: Nuclear sclerotic cataract right eye. H25.11   POSTOPERATIVE DIAGNOSIS: Nuclear sclerotic cataract right eye. H25.11   PROCEDURE:  Phacoemusification with posterior chamber intraocular lens placement of the right eye  Ultrasound time: Procedure(s): CATARACT EXTRACTION PHACO AND INTRAOCULAR LENS PLACEMENT (IOC) RIGHT DIABETIC OMIDRIA 11.17 01:06.5 (Right)  LENS:   Implant Name Type Inv. Item Serial No. Manufacturer Lot No. LRB No. Used Action  LENS IOL TECNIS EYHANCE 24.5 - [id number] Intraocular Lens LENS IOL TECNIS EYHANCE 24.5 [id number] [employer]  Right 1 Implanted      SURGEON:  Julious Payer. Rolley Sims, MD   ANESTHESIA:  Topical with tetracaine drops, augmented with 1% preservative-free intracameral lidocaine.   COMPLICATIONS:  None.   DESCRIPTION OF PROCEDURE:  The patient was identified in the holding room and transported to the operating room and placed in the supine position under the operating microscope.  The right eye was identified as the operative eye, which was prepped and draped in the usual sterile ophthalmic fashion.   A 1 millimeter clear-corneal paracentesis was made superotemporally. Preservative-free 1% lidocaine mixed with 1:1,000 bisulfite-free aqueous solution of epinephrine was injected into the anterior chamber. The anterior chamber was then filled with Viscoat viscoelastic. A 2.4 millimeter keratome was used to make a clear-corneal incision inferotemporally. A curvilinear capsulorrhexis was made with a cystotome and capsulorrhexis forceps. Balanced salt solution was used to hydrodissect and hydrodelineate the nucleus. Phacoemulsification was then used to remove the lens nucleus and epinucleus. The remaining cortex was then removed using the irrigation and aspiration handpiece. Provisc was then placed into the capsular bag to distend it for lens placement. A +24.50 D DIB00 intraocular lens  was then injected into the capsular bag. The remaining viscoelastic was aspirated.   Wounds were hydrated with balanced salt solution.  The anterior chamber was inflated to a physiologic pressure with balanced salt solution.  No wound leaks were noted. Vigamox was injected intracamerally.  Timolol and Brimonidine drops were applied to the eye.  The patient was taken to the recovery room in stable condition without complications of anesthesia or surgery.  [name] Dunn Loring 09/09/2022, 8:00 AM

## 2022-09-09 NOTE — Anesthesia Postprocedure Evaluation (Signed)
Anesthesia Post Note  Patient: MARKAL CEDILLOS  Procedure(s) Performed: CATARACT EXTRACTION PHACO AND INTRAOCULAR LENS PLACEMENT (IOC) RIGHT DIABETIC OMIDRIA 11.17 01:06.5 (Right)  Patient location during evaluation: PACU Anesthesia Type: MAC Level of consciousness: awake and alert Pain management: pain level controlled Vital Signs Assessment: post-procedure vital signs reviewed and stable Respiratory status: spontaneous breathing, nonlabored ventilation, respiratory function stable and patient connected to nasal cannula oxygen Cardiovascular status: stable and blood pressure returned to baseline Postop Assessment: no apparent nausea or vomiting Anesthetic complications: no   No notable events documented.   Last Vitals:  Vitals:   09/09/22 0801 09/09/22 0806  BP: 117/72 126/73  Pulse: 63 60  Resp: (!) 24 16  Temp: (!) 36.3 C   SpO2: 97% 100%    Last Pain:  Vitals:   09/09/22 0806  TempSrc:   PainSc: 0-No pain                 Corinda Gubler

## 2022-09-10 ENCOUNTER — Encounter: Payer: Self-pay | Admitting: Ophthalmology

## 2022-09-16 ENCOUNTER — Ambulatory Visit (INDEPENDENT_AMBULATORY_CARE_PROVIDER_SITE_OTHER): Payer: No Typology Code available for payment source | Admitting: Internal Medicine

## 2022-09-16 ENCOUNTER — Encounter: Payer: Self-pay | Admitting: Internal Medicine

## 2022-09-16 VITALS — BP 94/58 | HR 72 | Temp 98.2°F | Ht 66.5 in | Wt 173.0 lb

## 2022-09-16 DIAGNOSIS — J61 Pneumoconiosis due to asbestos and other mineral fibers: Secondary | ICD-10-CM

## 2022-09-16 DIAGNOSIS — J679 Hypersensitivity pneumonitis due to unspecified organic dust: Secondary | ICD-10-CM

## 2022-09-16 DIAGNOSIS — Z7952 Long term (current) use of systemic steroids: Secondary | ICD-10-CM | POA: Diagnosis not present

## 2022-09-16 DIAGNOSIS — J849 Interstitial pulmonary disease, unspecified: Secondary | ICD-10-CM | POA: Diagnosis not present

## 2022-09-16 NOTE — Progress Notes (Addendum)
OV 11/04/2021  Subjective:  Patient ID: Brian Casey, male , DOB: [birth year] , age 72 y.o. , MRN: [id number] , ADDRESS: [address] [place] [address] PCP [name], MD Patient Care Team: [name], MD as PCP - General (Family Medicine) Minna Merritts, MD as Consulting Physician (Cardiology) Dahlia Byes, MD as Consulting Physician (Cardiothoracic Surgery)  This Provider for this visit: Treatment Team:  Attending Provider: Brand Males, MD    11/04/2021 -  hx chart reviwee,patient, wife, ILD questions Chief Complaint  Patient presents with   Consult    Pt has had recent imaging performed which showed diagnosis of ILD. Pt has had complaints of cough and SOB.     HPI Brian Casey 72 y.o. -  Presents with wife. Has hx of heavy asbestos ezposure. Former smoker. In Aug 2017 had acute chole, e colii bcacteremia and NSTEMI and sp CABG. CTA at that time showed some GGO and bialteral efffusion. Then in 2019 June admitted for had pleuirsy and s/p thora 1L (rad review confirms that). LHD 579. CUtlure negatiuve. WAS EOSINOPHLIC 27%. C at that the time considered as ILD by chest radiology. Also had AKI creat 1.35m%. Then started following with Dr FRaul Delin [place] Says he was on surveillance approach. No thora ever after that. In April 2022 he had CT - the ILD for first time started getting worse but he was feeling fine. In June 2022 started seeing Dr ALanney Gins ARound this time also had Covid. In Nov 2022 had another CT cchest  ILD progressed but stil feeling ok. In Jan 2023 developed cough, ACE inhibitor got changed to ARB and sometime later got started in pred/immuran as well for his ILD. Then developed flu like symptoms. Both ARB and immuran g t stopped and he got better. Immuran restarted at lower dose at 547mper day in May 2023. CT at this time showed worsening ILD. 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 Prednisone at 2029mer day. No more flu symptoms.  Feels side effect was ARB related. Despte immune suppression regimen he feels he is declining more dysneic. Still on room air at home and started rehab few days ago. Noticed in rehab needing 4L [place] to exert  (also confirmed here this visit). In addition prednisone is causing insomina and fatigue. He feels this med regimen is not helping  Of note he has upcoing endoscopy and colonoscop with Dr TolAlice Reichert30/23. Also has a verntral hernia for which he wants surgery. Overall he and his wife are very concerned about this ILD   He is also on Inhaler Rx and not sure it is helOrthoptisttegrated Comprehensive ILD Questionnaire  Symptoms:  gradally getting worse x 6 months. Cough x 6 months only but CT actually getting worse Apr 2022 -> May 2023. 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 Worked for [employer] in [place] (27 items):  Prednisone and Immuran since May 2023  INVESTIGATIONS: 2017 CT abdomen lung cuts  He had left-sided pleural effusion and reticular abnormalities suggestive of ILD [back then it was  called as bibasilar atelectasis]  2017 August CT scan of the chest: Bilateral pleural effusions some upper lobe groundglass opacities.  No clear-cut evidence of ILD:  2019 June CT scan of the chest: Read by Dr. Polly Cobia.  Concerning for ILD.  Reticulation also left-sided pleural effusion  May 2021 high-resolution CT chest: Read by Dr. Polly Cobia.  Indeterminate pattern for UIP versus alternate diagnosis.Marland Kitchen  Definite of ILD.  There is subpleural sparing.  Left-sided pleural effusion present.  Findings deemed is unchanged from prior.  I personally agree with the findings  April 2022 CT chest without contrast [not a high-resolution CT chest].  Personally thought ILD is worse.  Radiology seems to agree  November 2022 in May 2023 CT scan of the chest [again not high-resolution] definite worsening of ILD changes.  Increased groundglass component.  May 2023: CT - progressive ILD. Worsning GGO and reticulation. Trace left effuion   UGI score 11/19/21:: Diffuse mild inflammation characterized by congestion (edema) and erosions was found in the entire examined stomach.   OV 11/20/2021  Subjective:  Patient ID: Brian Casey, male , DOB: April 20, 1950 , age 72 y.o. , MRN: [id number] , ADDRESS: [place] [address] PCP [name], MD Patient Care Team: [name], MD as PCP - General (Family Medicine) Minna Merritts, MD as Consulting Physician (Cardiology) Dahlia Byes, MD as Consulting Physician (Cardiothoracic Surgery)  This Provider for this visit: Treatment Team:  Attending Provider: Brand Males, MD   11/20/2021 -returns for video visit.  ILD work-up in progress.   HPI Brian Casey 72 y.o. -  #ILD work-up: After his last visit I presented him at the thoracic multidisciplinary conference.  Dr. Burt Ek felt the CT scan was more consistent with alternative pattern possibly fibrotic NSIP or even HP given the upper lobe predominance and air trapping.  He  subsequently had a high-resolution CT scan of the chest that was read by Dr. Vinnie Langton who feels CT scan is more consistent with chronic HP.  We went over the exposures again there is no current exposure.  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  While he remembers asbestos exposure he does not remember mold exposure but it was definitely water and there was a lot of dampness.  There is no evidence of asbestos plaques or UIP pattern on the CT scan of the chest.  Nevertheless it is progressive.  We discussed the differential diagnosis currently of chronic HP versus NSIP versus asbestosis versus IPF.  It appears that most likely this is chronic HP.  Based on this and the progression my recommendation to him was that we would start antifibrotic's.  Did indicate to him that there is a role for prednisone and immunomodulators in the setting of chronic NSIP or HP but given the side effect profile with prednisone [fatigue and insomnia] and also future potential risk for  immunosuppression with Imuran I advised that antifibrotic's nintedanib would be first-line.  He is better now with 10 mg prednisone.  He is sleeping better.  Therefore also recommended continuing with prednisone.  Recommended holding off any restarting of the Imuran or consideration of CellCept as a third line option.  We will also going to hold off on surgical lung biopsy.  Indicated to him that in the long run he would need to lose weight get fit and consider transplant as an option.  For these purposes I would like to avoid immunosuppression and also surgical lung biopsy.  In addition with 4 L exertion desaturation did not want to consider surgical lung biopsy at this point in time.  He and his wife are agreeable with the plan  #Therapeutic side effects: After stopping prednisone his insomnia and fatigue are better.  We discussed  nintedanib.  He does have diverticulosis on the recent CT scan but has never had diverticulitis.  He does not have a history of GI bleeding.  No history of MI.  Recent echo was normal.  We discussed the diarrhea and liver toxicity monitoring of the rare side effects of nintedanib.  He is agreeable to start this.  #Hypoxemia: He told me that he is desaturating at home with 4 L but does well in rehab because of his machine.  I recommended oxygen titration test but my CMA indicated to me that he did fine with 4 L exertion in our office at last visit.  She will him to contact him and verify if he is still desaturating at home.  If so then we will have to do a detailed oxygen titration test.  He is still awaiting his Joselyn Arrow through the [place].  He does not want to do it through [facility] and it could take a while.  #Umbilical hernia repair.  This is being held off at this point.       HRCT 11/13/21  Narrative & Impression  CLINICAL DATA:  72 year old male with history of cough and shortness of breath worsening since May 2023. Evaluate for interstitial lung disease.   EXAM: CT CHEST WITHOUT CONTRAST   TECHNIQUE: Multidetector CT imaging of the chest was performed following the standard protocol without intravenous contrast. High resolution imaging of the lungs, as well as inspiratory and expiratory imaging, was performed.   RADIATION DOSE REDUCTION: This exam was performed according to the departmental dose-optimization program which includes automated exposure control, adjustment of the mA and/or kV according to patient size and/or use of iterative reconstruction technique.   COMPARISON:  Chest CT 08/20/2021.   FINDINGS: Cardiovascular: Heart size is normal. There is no significant pericardial fluid, thickening or pericardial calcification. There is aortic atherosclerosis, as well as atherosclerosis of the great vessels of the mediastinum and the coronary arteries,  including calcified atherosclerotic plaque in the left main, left anterior descending, left circumflex and right coronary arteries. Status post median sternotomy for CABG including LIMA to the LAD.   Mediastinum/Nodes: No pathologically enlarged mediastinal or hilar lymph nodes. Please note that accurate exclusion of hilar adenopathy is limited on noncontrast CT scans. Esophagus is unremarkable in appearance. No axillary lymphadenopathy.   Lungs/Pleura: Widespread but patchy areas of ground-glass attenuation, septal thickening, subpleural reticulation, thickening of the peribronchovascular interstitium, cylindrical bronchiectasis and peripheral bronchiolectasis are noted. Some areas of apparent developing honeycombing are noted, most evident near the apex of the right upper lobe. Overall, findings appear relatively similar to the recent prior chest CT. Inspiratory and  expiratory imaging demonstrates some mild air trapping indicative of mild small airways disease. No definite suspicious appearing pulmonary nodules or masses are noted. No pleural effusions.   Upper Abdomen: Aortic atherosclerosis.  Status post cholecystectomy.   Musculoskeletal: Median sternotomy wires. There are no aggressive appearing lytic or blastic lesions noted in the visualized portions of the skeleton.   IMPRESSION: 1. The appearance of the lungs is indicative of interstitial lung disease, with minimal progression compared to the most recent prior study, but definitive progression when compared to more remote prior examinations. 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 2. Aortic atherosclerosis, in addition to left main and three-vessel coronary artery disease. Status post median sternotomy for CABG including LIMA to the LAD.   Aortic Atherosclerosis  (ICD10-I70.0).     Electronically Signed   By: Vinnie Langton M.D.   On: 11/15/2021 06:28    ECHO 11/12/21  IMPRESSIONS     1. Left ventricular ejection fraction, by estimation, is 60 to 65%. The  left ventricle has normal function. The left ventricle has no regional  wall motion abnormalities. There is mild left ventricular hypertrophy.  Left ventricular diastolic parameters  are consistent with Grade I diastolic dysfunction (impaired relaxation).   2. Right ventricular systolic function is normal. The right ventricular  size is normal.   3. The mitral valve is normal in structure. No evidence of mitral valve  regurgitation.   4. The aortic valve is tricuspid. Aortic valve regurgitation is not  visualized. Aortic valve sclerosis is present, with no evidence of aortic  valve stenosis.   5. Aortic dilatation noted. There is mild dilatation of the aortic root,  measuring 39 mm.   6. The inferior vena cava is normal in size with greater than 50%  respiratory variability, suggesting right atrial pressure of 3 mmHg.    Aug 2023 ILD confi with Dr Burt Ek  In 2019 early ILD with reticuation. SIgnifcant progressiont hrough latest CT in May 2023. Lot of upper lobe > Lower lobe back then and now. OVerall pattern: ALTERNATIVE CATEGRORy. DDx is FIBROTIC HP (2021 Exp phase imaging was inadequate , 2020 there was mild air trappng). In May 2023 - more GGO  ? flare. Overall patch peribronchosvascular. NOT c/w UIP. Has pleural thicekning on left but prob due to cardiac urgery. No calcifiied pleural plaques  Consider bix if Risk ok   OV 01/13/2022  Subjective:  Patient ID: Brian Casey, male , DOB: 11-11-1950 , age 73 y.o. , MRN: [id number] , ADDRESS: [address] [place] [address] PCP [name], MD Patient Care Team: [name], MD as PCP - General (Family Medicine) Minna Merritts, MD as Consulting Physician (Cardiology) Dahlia Byes, MD as Consulting  Physician (Cardiothoracic Surgery)  This Provider for this visit: Treatment Team:  Attending Provider: Brand Males, MD    01/13/2022 -   Chief Complaint  Patient presents with   Follow-up    Follow-up PT states breathing is not better, DOE   ILD - workup complete. 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 -  prsents with wife for followup. He and wife are quire worried about his health and life expectancy (few to several years) and quality of life He has sveral concrns  Easy destruation- sometimes to 60% and 75%. Always with eerrtion and relieved by rest but there is variability. He is unsure of quality of pulse ox probe but appears same thing inr ehab based on 5md test chart review. STill here able to walk ADL distances ith 6L and without desats Echo shows Gr 1 DDx - has seen cards Saw Dr GRockey Situ- has not had RLenwoodFeels rehab did not help him though 677m distance improved fom 400+ feet to 700 + feet ILD Symptoms: feels getting worse. Needing more o2 Not yet started night o2: ONO results are finally in but with PCP HeMaryland PinkMD [facility]  On ofev but having severe diarrhea. Takes immodium in reactionary fashion every other day. Drinks coffee daily.  Drinks "no sugar added" apple juice Rx - continue prednisone 1013mer day Pointd out he worked in [place] [employer] - explained could be few to several years but with small % declining rapidly in < 1 year.  In his case PFTs show sever restriction and dLCO Also unintenitional weight loss due to ofev - but he sees this as positive due to obsity Has new low K 3 meq 12/31/21 and was wondering if due to prednisone  (unlikely) or diarrhea (possibly yes) Abd hernia - surgery not in the works Obesity - BMI is > 30 and is a barrier to health  =     OV 03/12/2022  Subjective:  Patient ID: Brian Casey , DOB: [birth year]age 44 31o. , MRN: [id number]: [address] HedMaryland PinkD Patient Care Team: HedMaryland PinkD as PCP - General (Family Medicine) GolMinna MerrittsD as PCP - Cardiology (Cardiology) VanDahlia ByesD as Consulting Physician (Cardiothoracic Surgery)   Type of visit: Video Virtual Visit Identification of patient LarAMANDEEP NESMITHth 1/21954-01-03d MRN [id number] person identifier Risks: Risks, benefits, limitations of telephone visit explained. Patient understood and verbalized agreement to proceed Anyone else on call: wife Patient location: home This provider location: [id number]; [place] [id number]. [phone]    This Provider for this visit: Treatment Team:  Attending Provider: RamBrand MalesD  Chronic HP  - failed ofev -  GI and weight loss  - prior on immuran Chronic Resp failure 4L [place] Obesity  - goal weight 170-175#  03/12/2022 -  No chief complaint on file.    HPI LarKOLLEN Casey 77o. -stopped ofev 02/16/22. Recent increase in cough. PCP office was positive for flu 03/05/22. Since then cough is worse. Rx with Tamiflu but has not helped. Using hydrocodone more now but still has cough at night. Slowly improving though.  On 02/23/22 his bili was 1.4 (After stopping ofev) but rechck 03/03/22 bilis is 1.2. Currently 4L [place] day and at night 2L [place] ([facility] Wife says even with exertion at 4L [place] drops to 77% . Unable to do "Anything" without desats. Currently remains on 28m35mednisone. HE has challenge going up on prednisone diue to insominia. But now PCP has him on xanax and is helping sleep . So he feels he can challenge himself  with higher dose prednisone   ILD Rx:   -  Discussed transplant: wife dealing with aneurysm thoracoabdominal and is awaitig surgery in [facility] with a device in [place].  They are busy with all this. Surgery is in Q2 2024 and is taking a lot of her time.  This can prove a challenge. Wife is only 1 caregiver. There are kids but they are working. Living in [place]. But after discussion willing to see them.   - antifibrotic : did not tolerate ofev.  We discussed esbriet. Explained side effects. Explained dosing. [facility] have [facility]. He Is willing. BRiefly discussed immuran and cellcept as optoons  - exertional hypoxemia: advised to increase o2 with exertion. In Oct we needed to increase to [facility]. Home concerntrator does not go to 6l [place] He wants order to go through CAT at the [facility]. HE says our [facility] knows the [facility] contact  Right heart cath 02/03/2022- PAP . -> does not meet criteria for tyvaso    06/05/2022 Follow up : ILD, O2 RF  Patient presents for a 6-week follow-up.  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  He has a history of heavy exposure to asbestos with previous work at [employer].  He has previously tried [employer] but was intolerant.  Patient started on Esbriet the beginning of February 2024.  Patient was tolerating well up until he started Esbriet 2 capsules 3 times daily.  Had significant nausea and decreased appetite.  Patient was called in Zofran started on a drug holiday.  Patient says his symptoms immediately stopped when he stopped his Esbriet.  He did not have to use any Zofran.  He had no vomiting or diarrhea.  Patient says he is recently restarted back on Esbriet 2 weeks ago.  He is currently on 1 capsule twice daily and is tolerating well.  We discussed going up very slowly on the Esbriet to see if he can tolerate.  Recent lab work showed a slight increase in his ALT at 63.  Patient is on chronic steroids with prednisone 10  mg.  He does have diabetes and is recently been started on Jardiance. Patient had PFTs done May 07, 2022 that showed stable lung function with FEV1 at 43%, ratio 79, FVC 40%, DLCO 24%.  This is similar to September 2023.  Walk test in the office shows stable oxygen demands -require 6 L of oxygen on continuous flow to maintain O2 saturations while walking.  At rest patient says he does not require any oxygen at all cannot sit still without his oxygen on and keep his sats above 90%.  Today in the office O2 saturations at rest was 90% on room air.  As soon as the patient stands up or starts to walk he does require oxygen because his O2 saturations dropped into the 80s.  Patient denies any hemoptysis, chest pain, orthopnea. Patient has completed pulmonary rehab in the past.  Patient says he is not very active at home.  Is somewhat sedentary.  We discussed increasing his activity as tolerated.  Patient does have upcoming surgery at the end of this month for a umbilical hernia repair.   OV 07/21/2022  Subjective:  Patient ID: Brian Casey, male , DOB: 10-Nov-1950 , age 89 y.o. , MRN: [id number] , ADDRESS: [address] [place] [address] PCP Jerl Mina, MD Patient Care Team: Jerl Mina, MD as PCP - General (Family Medicine) Antonieta Iba, MD as PCP - Cardiology (Cardiology) Lovett Sox, MD as Consulting Physician (Cardiothoracic Surgery)  This Provider for this visit: Treatment Team:  Attending Provider: Kalman Shan, MD  Chronic HP  - failed ofev -  GI and weight loss  - prior on immuran Chronic Resp failure 4L [place] Obesity  - goal weight 160-165#      Intolerant to Ofev 2023 Esbriet started February 2024  07/21/2022 -   Chief Complaint  Patient presents with   Follow-up    F/up      HPI Brian Casey 72 y.o. -returns for follow-up.  He has had further healthy weight loss.  His BMI is now 28.  He did go to [facility].  He says he was declined.  This because of his medical issues.  Meanwhile he overall feels stable.  His wife is sick with needing cardiac bypass.  She also has upcoming aneurysm surgery.  At the time of cardiac bypass because he was taken care of he stopped his pirfenidone.  He has now restarted it on July 18, 2022 and is going to escalated to full dose.  His pulse ox on room air at rest is 88-91%.  I made him do 5 sets of sit-stand in the office on room air and his pulse ox dropped to 79%.  It recovered after 30 seconds.  He feels at baseline.  He is frustrated little bit because he got declined transplant but he is reconciled to that.  He is proud of his weight loss.  He did have his umbilical hernia surgery by Dr. Corliss Skains and I reviewed the notes.  Lab review shows that in March 2024 his LFTs were slightly high with ALT being slightly high at [facility].  He needs a repeat test today.  We discussed his future course with the hope of him being stable and requiring antifibrotic's.  He is worried about hypoglycemia with his prednisone 10 mg he wants to reduce it.  We took a shared decision making to reduce it to 8 mg/day.  We also discussed future care options including clinical trials.  Discussed the ILD-Pro registry.  He is interested in this but currently because of his wife's issues he is not willing to participate.  We discussed inhaled treprostinil in our clinical trial protocol versus placebo.  He is interested in this but again we will have to wait till latter part of the year to enroll in this.  He is going to meet with the research coordinator to look at the consent form.    OV 09/16/2022  Subjective:  Patient ID: Brian Casey, male , DOB: 1951/03/02 , age 69 y.o. , MRN: [id number] , ADDRESS: [address] [place] [address] PCP Jerl Mina, MD Patient Care Team: Jerl Mina, MD as PCP - General (Family Medicine) Antonieta Iba, MD as PCP - Cardiology  (Cardiology) Lovett Sox, MD as Consulting Physician (Cardiothoracic Surgery)  This Provider for this visit: Treatment Team:  Attending Provider: Kalman Shan, MD    09/16/2022 -   Chief Complaint  Patient presents with   Follow-up    Breathing has been stable. He is tolerating the pirfenidone at 2 tablets TID.     Chronic HP  - hx of asbestos exposure at work + damp environment  -On chronic daily prednisone; reduced to 8 mg/day April 2024.  -August 2023 multidisciplinary case conference: Pleural thickening on the left side but no calcified pleural plaques and 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  - failed ofev -  GI and weight loss  -  prior on immuran  - Intolerant to Ofev 2023  Esbriet started February 2024  Chronic Resp failure 4L [place] Obesity  - goal weight 160-165#  - 173#  on 09/16/2022  Right heart cath 02/03/2022- PAP . -> does not meet criteria for tyvaso       HPI Brian Casey 72 y.o. -returns for follow-up.  He is currently taking pirfenidone full dose.  He is tolerating it well.  He reports overall being stable.  He is lost weight.  He is almost at goal weight right now.  He is very pleased about it.  He is on Jardiance right now.  He is disappointed that [facility] declined him for lung transplant partly because of his diabetes but he feels that his blood sugars better controlled now with Jardiance and also weight loss.  His wife is here with him.  She survived her cardiac bypass and long hospital stay.  She feels well.  She also affirms that his diabetes is under better control.  We took a joint decision to rerefer him to [facility].  Overall he is stable.  He did have safety labs mid June 2024 which showed elevated transaminase but when repeated September 11, 2022 these liver enzymes are normal.  I reviewed the external records for this.  He is now on his baseline prednisone of 8 mg/day.  He uses oxygen with  exertion.  Of note he is worried that he has asbestosis from occupational exposure.  He showed me a letter that he had a B reader review his films.  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  He did supply supplemental findings later after the visit.  The B reader did indicate concern for pneumoconiosis.  The B reader was satisfied with pleural thickening along with exposure history and latency and presence of ILD.  In August 2023 in our case conference it was felt that he did not have pleural classification.  I did tell him that even though he had asbestos exposure and even though he has pulmonary fibrosis sometimes the diagnose of asbestosis Royetta Car is ILD due to asbestos exposure is more confident only by this asbestos pleural plaques.  On the other hand I did say there is reason to believe he has occupational aided lung disease given the fact he worked in the heavy damp area on the CT scan is more consistent with hypersensitive pneumonitis.  However there is a strong asbestos exposure history according to him and his latency..  Therefore I do agree and concede that even in the absence of pleural plaques he could easily have asbestosis.  Only lung biopsy can sort out if he has hypersensitive pneumonitis or not but it is too risky to do that.   He is able to talk to his attorney about this.      SYMPTOM SCALE - ILD 11/04/2021 01/13/2022 ofev 03/12/2022 Off ofev 07/21/2022   Current weight  196# 181.8# - eeliugn around same 177# - esbiret   420 feet in July 745 feet afte rehab In Oct 2023    O2 use RA but needing 4L  with exertin 4L rest and exertion. Sometimes 6L    Shortness of Breath 0 -> 5 scale with 5 being worst (score 6 If unable to do)     At rest 0 0  0  Simple tasks - showers, clothes change, eating, shaving 4 4  2   Household (dishes, doing bed, laundry) 4 4  3  Shopping 3 4  3   Walking level at own pace 3 4  3   Walking up Stairs 5 5  3   Total (30-36)  Dyspnea Score 19 21  14     Non-dyspnea symptoms (0-> 5 scale) 11/04/2021 01/13/2022   07/21/2022   How bad is your cough? 2.5 0  1  How bad is your fatigue 3 3  2   How bad is nausea 0 2  0  How bad is vomiting?  0 0  0  How bad is diarrhea? 0 5+  0.5  How bad is anxiety? 3 4  0  How bad is depression 2 3  0  Any chronic pain - if so where and how bad 0 x  x    Simple office walk 185 feet x  3 laps goal with forehead probe 01/13/2022  Needs walk test   O2 used 4 88-92% RA at rest   Number laps completed Aim ws 3 and start of 2nd lap increased o2    Comments about pace avg    Resting Pulse Ox/HR 100% and 66/min    Final Pulse Ox/HR 86% and 98/min Desat to 79% with 5 sit-stand   Desaturated </= 88% Yes at start of 2n lap    Desaturated <= 3% points yes    Got Tachycardic >/= 90/min yes    Symptoms at end of test Mod doe    Miscellaneous comments 6L [place] finished all 3 laps wth correction       PFT     Latest Ref Rng & Units 05/07/2022    3:38 PM 12/12/2021   10:02 AM 11/11/2015    7:38 AM  PFT Results  FVC-Pre L 1.57  1.41  1.20   FVC-Predicted Pre % 40  35  28   FVC-Post L  1.46  1.35   FVC-Predicted Post %  36  32   Pre FEV1/FVC % % 79  89  60   Post FEV1/FCV % %  89  56   FEV1-Pre L 1.24  1.26  0.72   FEV1-Predicted Pre % 43  43  22   FEV1-Post L  1.30  0.76   DLCO uncorrected ml/min/mmHg 5.76  5.38  8.93   DLCO UNC% % 24  22  31    DLCO corrected ml/min/mmHg  5.38  10.84   DLCO COR %Predicted %  22  38   DLVA Predicted % 63  59  119   TLC L  2.23  2.47   TLC % Predicted %  34  38   RV % Predicted %  33  61        has a past medical history of Adenomatous colon polyp, Aortic root dilatation (HCC), Arthritis, CAD (coronary artery disease), Chronic respiratory failure with hypoxia (HCC), CKD (chronic kidney disease), stage III (HCC), Diastolic dysfunction, Diverticulosis, GERD (gastroesophageal reflux disease), Grade I diastolic dysfunction, High cholesterol,  History of bronchitis, Hypertension (10/17/2013), Hypothyroidism, ILD (interstitial lung disease) (HCC), Mild pulmonary hypertension (HCC), PAD (peripheral artery disease) (HCC), Post-op atrial fibrillation (HCC) (11/06/2015), Shortness of breath, Type II diabetes mellitus (HCC), and Wears glasses.   reports that he quit smoking about 6 years ago. His smoking use included cigarettes. He has a 48.00 pack-year smoking history. He has never used smokeless tobacco.  Past Surgical History:  Procedure Laterality Date   CARDIAC CATHETERIZATION Right 11/06/2015   Procedure: Left Heart Cath and Coronary Angiography;  Surgeon: Antonieta Iba, MD;  Location:  [facility];  Service: Cardiovascular;  Laterality: Right;   CATARACT EXTRACTION W/PHACO Right 09/09/2022   Procedure: CATARACT EXTRACTION PHACO AND INTRAOCULAR LENS PLACEMENT (IOC) RIGHT DIABETIC OMIDRIA 11.17 01:06.5;  Surgeon: Estanislado Pandy, MD;  Location: [facility];  Service: Ophthalmology;  Laterality: Right;   CHOLECYSTECTOMY N/A 01/17/2016   Procedure: LAPAROSCOPIC CHOLECYSTECTOMY WITH INTRAOPERATIVE CHOLANGIOGRAM;  Surgeon: Claud Kelp, MD;  Location: [facility] OR;  Service: General;  Laterality: N/A;   COLONOSCOPY     COLONOSCOPY N/A 11/19/2021   Procedure: COLONOSCOPY;  Surgeon: Toledo, Boykin Nearing, MD;  Location: [facility] ENDOSCOPY;  Service: Gastroenterology;  Laterality: N/A;   CORONARY ARTERY BYPASS GRAFT N/A 11/14/2015   Procedure: CORONARY ARTERY BYPASS GRAFTING (CABG) x 4;  Surgeon: Kerin Perna, MD;  Location: [facility] OR;  Service: Open Heart Surgery;  Laterality: N/A;   ENDARTERECTOMY Left 11/14/2015   Procedure: LEFT ENDARTERECTOMY CAROTID;  Surgeon: Maeola Harman, MD;  Location: [facility] OR;  Service: Vascular;  Laterality: Left;   ESOPHAGOGASTRODUODENOSCOPY N/A 11/19/2021   Procedure: ESOPHAGOGASTRODUODENOSCOPY (EGD);  Surgeon: Toledo, Boykin Nearing, MD;  Location: [facility] END[facility];  Service: Gastroenterology;   Laterality: N/A;   EXCISION OF MESH N/A 06/18/2022   Procedure: EXCISION OF MESH;  Surgeon: Manus Rudd, MD;  Location: [facility] OR;  Service: General;  Laterality: N/A;   FINGER GANGLION CYST EXCISION Left X 3   "index finger X 2; thumb X 1"   HEMORRHOID SURGERY  1982   HERNIA REPAIR     INSERTION OF MESH N/A 11/16/2013   Procedure: INSERTION OF MESH;  Surgeon: Wilmon Arms. Corliss Skains, MD;  Location: [facility] OR;  Service: General;  Laterality: N/A;   IR GENERIC HISTORICAL  11/08/2015   IR PERC CHOLECYSTOSTOMY 11/08/2015 Gilmer Mor, DO [facility]   IR GENERIC HISTORICAL  12/13/2015   IR CHOLANGIOGRAM EXISTING TUBE 12/13/2015 [facility]   IR GENERIC HISTORICAL  12/25/2015   IR CATHETER TUBE CHANGE 12/25/2015 Simonne Come, MD [facility]   IR GENERIC HISTORICAL  12/12/2015   IR RADIOLOGIST EVAL & MGMT 12/12/2015 Berdine Dance, MD [facility]   RIGHT HEART CATH Right 02/03/2022   Procedure: RIGHT HEART CATH;  Surgeon: Yvonne Kendall, MD;  Location: [facility];  Service: Cardiovascular;  Laterality: Right;   TEE WITHOUT CARDIOVERSION N/A 11/14/2015   Procedure: TRANSESOPHAGEAL ECHOCARDIOGRAM (TEE);  Surgeon: Kerin Perna, MD;  Location: [facility] OR;  Service: Open Heart Surgery;  Laterality: N/A;   UMBILICAL HERNIA REPAIR N/A 11/16/2013   Procedure: UMBILICAL HERNIA REPAIR;  Surgeon: Wilmon Arms. Corliss Skains, MD;  Location: [facility] OR;  Service: General;  Laterality: N/A;   UMBILICAL HERNIA REPAIR N/A 06/18/2022   Procedure: OPEN UMBILICAL HERNIA REPAIR;  Surgeon: Manus Rudd, MD;  Location: [facility] OR;  Service: General;  Laterality: N/A;   VEIN HARVEST Right 11/14/2015   Procedure: RIGHT LEG GREATER SAPHENOUS VEIN HARVEST;  Surgeon: Kerin Perna, MD;  Location: [facility] OR;  Service: Open Heart Surgery;  Laterality: Right;    Allergies  Allergen Reactions   Penicillins Nausea And Vomiting and Other (See Comments)    REACTION: Vomiting Has patient had a PCN reaction causing immediate rash,  facial/tongue/throat swelling, SOB or lightheadedness with hypotension: Yes Has patient had a PCN reaction causing severe rash involving mucus membranes or skin necrosis: No Has patient had a PCN reaction that required hospitalization No Has patient had a PCN reaction occurring within the last 10 years: No If all of the above answers are "NO", then may  proceed with Cephalosporin use.   Amlodipine Swelling   Bupropion Hcl     REACTION: Severe constipation, insomnia   Chlorthalidone Other (See Comments)    Other reaction(s): Hyponatremia   Metformin Diarrhea   Nintedanib Diarrhea and Other (See Comments)   Terazosin Other (See Comments)    Immunization History  Administered Date(s) Administered   Fluad Quad(high Dose 65+) 01/13/2022   Influenza Inj Mdck Quad Pf 12/25/2015   Influenza Split 01/26/2011, 02/10/2012   Influenza Whole 01/22/2004, 02/05/2008, 12/26/2008   Influenza,inj,Quad PF,6+ Mos 12/29/2012, 01/10/2014, 12/11/2014   Influenza-Unspecified 12/25/2016, 12/22/2017, 12/21/2018, 12/07/2019, 12/24/2020   PFIZER Comirnaty(Gray Top)Covid-19 Tri-Sucrose Vaccine 04/09/2019, 05/02/2019, 12/28/2019, 01/01/2021   Pneumococcal Conjugate-13 03/29/2014, 11/06/2014, 11/15/2017   Pneumococcal Polysaccharide-23 11/05/2016   RSV,unspecified 02/19/2022   Td 08/22/1998, 08/18/2013   Unspecified SARS-COV-2 Vaccination 02/20/2022   Zoster, Live 03/29/2014    Family History  Problem Relation Age of Onset   Hypertension Mother    Diabetes Mother    Depression Mother    Colon cancer Mother    Hyperthyroidism Father    Hypertension Father    Prostate cancer Father    Diabetes Maternal Grandmother    Lung cancer Maternal Grandmother        non smoker     Current Outpatient Medications:    aspirin EC 81 MG tablet, Take 81 mg by mouth daily. Swallow whole., Disp: , Rfl:    atorvastatin (LIPITOR) 40 MG tablet, Take 40 mg by mouth daily., Disp: , Rfl:    carvedilol (COREG) 12.5 MG  tablet, Take 12.5 mg by mouth 2 (two) times daily with a meal., Disp: , Rfl:    empagliflozin (JARDIANCE) 10 MG TABS tablet, Take 25 mg by mouth daily., Disp: , Rfl:    furosemide (LASIX) 20 MG tablet, Take 20 mg by mouth daily., Disp: , Rfl:    levothyroxine (SYNTHROID) 100 MCG tablet, Take 100 mcg by mouth daily before breakfast., Disp: , Rfl:    loperamide (IMODIUM) 2 MG capsule, Take 2 mg by mouth as needed for diarrhea or loose stools., Disp: , Rfl:    omeprazole (PRILOSEC) 20 MG capsule, Take 1 capsule (20 mg total) by mouth daily. OFFICE VISIT REQUIRED FOR ADDITIONAL REFILLS, Disp: 30 capsule, Rfl: 0   potassium chloride (KLOR-CON) 10 MEQ tablet, Take 10 mEq by mouth daily., Disp: , Rfl:    predniSONE (DELTASONE) 1 MG tablet, Take 8 tablets (8 mg total) by mouth daily with breakfast., Disp: 270 tablet, Rfl: 6   tadalafil (CIALIS) 10 MG tablet, Take 10 mg by mouth daily as needed for erectile dysfunction., Disp: , Rfl:       Objective:   Vitals:   09/16/22 1130  BP: (!) 94/58  Pulse: 72  Temp: 98.2 F (36.8 C)  TempSrc: Oral  SpO2: 95%  Weight: 173 lb (78.5 kg)  Height: 5' 6.5" (1.689 m)    Estimated body mass index is 27.5 kg/m as calculated from the following:   Height as of this encounter: 5' 6.5" (1.689 m).   Weight as of this encounter: 173 lb (78.5 kg).  @WEIGHTCHANGE @  [employer]   09/16/22 1130  Weight: 173 lb (78.5 kg)     Physical Exam   General: No distress. Looks well O2 at rest: no Cane present: no Sitting in wheel chair: no Frail: no Obese: no Neuro: Alert and Oriented x 3. GCS 15. Speech normal Psych: Pleasant Resp:  Barrel Chest - no.  Wheeze - no, Crackles - barely,  No overt respiratory distress CVS: Normal heart sounds. Murmurs - no Ext: Stigmata of Connective Tissue Disease - no HEENT: Normal upper airway. PEERL +. No post nasal drip        Assessment:       ICD-10-CM   1. ILD (interstitial lung disease) (HCC)  J84.9 Pulmonary  function test    2. Hypersensitivity pneumonitis (HCC)  J67.9     3. Current chronic use of systemic steroids  Z79.52     4. Asbestosis (HCC)  [phone]          Plan:      #ILD secondary  - to hypersensitivity pneumonitis secondary to work exposure with exercise hypxoemixa - abestos exposure and asbestosis strong in the differential diagnosis. #Chronic prednisonuese #OFev intolerance # On Esbriet since April 2024  - stable clinically  - pulse ox 85% RA at rest -Liver function test [facility] at [facility] 09/11/2022 -Tolerating full dose Esbriet since April 2024 -Continue pulmonary fibrosis/interstitial lung disease secondary to workplace exposure in a heavy damp environment [likely had mold] but also asbestos exposure/asbestosis -   Plan -Will get our radiologist to look at the CT again to see if there is any evidence of asbestos pleural plaques  -If there are asbestos pleural plaques it makes the confidence in asbestosis [interstitial lung disease from asbestos exposure] very high but current history is sufficient enough to consider you have asbestosis - - too bad [facility] declined transplant but with current weight loss I am show we can rerefer  -Rerefer to [facility] for lung transplant clinic. -Tolerating 8 mg of prednisone daily; continue 8 mg daily dose -We can look at reducing this in the future -Continue pirfenidone per protocol -Continue 4 L -6L N nasal cannula with exertion; goal pulse ox greater than 86% - spirometry and dlco in BRL clinic in 8-10 weeks -Cancel office visit with Rikki Spearing on 10/16/2022 - consider clinical trials -  - ILD-PRO registry  - Tyvaso trials  -    #Obesity with a BMI greater than 30 and hyperglycemia  - Glad there is weight loss - BMI now 27.5 and better  Plan  - conitue weight  loss to around 160-165# -     Follow-up - Return to see Dr. Marchelle Gearing)( 30-minute visit in less than 10 weeks  - symptims score and sit/stand  a    ( Level 05 visit E&M 2024: Estb >= 40 min in  visit type: on-site physical face to visit  in total care time and counseling or/and coordination of care by this undersigned MD - Dr Kalman Shan. This includes one or more of the following on this same day 09/16/2022: pre-charting, chart review, note writing, documentation discussion of test results, diagnostic or treatment recommendations, prognosis, risks and benefits of management options, instructions, education, compliance or risk-factor reduction. It excludes time spent by the CMA or office staff in the care of the patient. Actual time 42 min)   SIGNATURE    Dr. Kalman Shan, M.D., F.C.C.P,  Pulmonary and Critical Care Medicine Staff Physician, [facility] Director - Interstitial Lung Disease  Program  Pulmonary Fibrosis [facility] at [facility] [place], [place], [address]  Pager: [phone], If no answer or between  15:00h - 7:00h: call [phone] Telephone: [phone]  2:47 PM 09/16/2022

## 2022-09-16 NOTE — Patient Instructions (Addendum)
ICD-10-CM   1. ILD (interstitial lung disease) (HCC)  J84.9 Pulmonary function test    2. Hypersensitivity pneumonitis (HCC)  J67.9     3. Current chronic use of systemic steroids  Z79.52     4. Asbestosis (HCC)  J61          - stable clinically  - pulse ox 85% RA at rest -Liver function test normal at [facility] 09/11/2022 -Tolerating full dose Esbriet since April 2024 -Continue pulmonary fibrosis/interstitial lung disease secondary to workplace exposure in a heavy damp environment [likely had mold] but also asbestos exposure -   Plan -Will get our radiologist to look at the CT again to see if there is any evidence of asbestos pleural plaques  -If there are asbestos pleural plaques it makes the confidence in asbestosis [interstitial lung disease from asbestos exposure] very high - - too bad [facility] declined transplant but with current weight loss I am show we can rerefer  -Rerefer to [facility] for lung transplant clinic. -Tolerating 8 mg of prednisone daily; continue 8 mg daily dose -We can look at reducing this in the future -Continue pirfenidone per protocol -Continue 4 L -6L N nasal cannula with exertion; goal pulse ox greater than 86% - spirometry and dlco in BRL clinic in 8-10 weeks -Cancel office visit with Rikki Spearing on 10/16/2022 - consider clinical trials -  - ILD-PRO registry  - Tyvaso trials  -    #Obesity with a BMI greater than 30 and hyperglycemia  - Glad there is weight loss - BMI now 27.5 and better  Plan  - conitue weight  loss to around 160-165# -     Follow-up - Return to see Dr. Marchelle Gearing)( 30-minute visit in less than 10 weeks  - symptims score and sit/stand a

## 2022-09-21 NOTE — Telephone Encounter (Signed)
Thank you for this.  I adjusted the notes accordingly.  Taking this into account

## 2022-10-05 NOTE — Anesthesia Preprocedure Evaluation (Addendum)
Anesthesia Evaluation  Patient identified by MRN, date of birth, ID band Patient awake    Reviewed: Allergy & Precautions, H&P , NPO status , Patient's Chart, lab work & pertinent test results, reviewed documented beta blocker date and time   Airway Mallampati: III  TM Distance: >3 FB Neck ROM: Full    Dental   Has porcelain veneers on upper front teeth:   Pulmonary neg pulmonary ROS, shortness of breath, former smoker shortness of breath, at rest and Long-Term Oxygen Therapy, former smoker ILD with 5L O2 per min, 2L/min at night     + decreased breath sounds    Pulmonary exam normal breath sounds clear to auscultation       Cardiovascular hypertension, Pt. on home beta blockers + angina  + CAD, + Past MI and + Peripheral Vascular Disease  negative cardio ROS Normal cardiovascular exam Rhythm:Regular Rate:Normal  hypertension, Pt. on medications and Pt. on home beta blockers + angina  + CAD, + Past MI, + Peripheral Vascular Disease and + PND    Rhythm:Regular Rate:Normal   Echo 11-12-21  1. Left ventricular ejection fraction, by estimation, is 60 to 65%. The  left ventricle has normal function. The left ventricle has no regional  wall motion abnormalities. There is mild left ventricular hypertrophy.  Left ventricular diastolic parameters  are consistent with Grade I diastolic dysfunction (impaired relaxation).   2. Right ventricular systolic function is normal. The right ventricular  size is normal.   3. The mitral valve is normal in structure. No evidence of mitral valve  regurgitation.   4. The aortic valve is tricuspid. Aortic valve regurgitation is not  visualized. Aortic valve sclerosis is present, with no evidence of aortic  valve stenosis.   5. Aortic dilatation noted. There is mild dilatation of the aortic root,  measuring 39 mm.   6. The inferior vena cava is normal in size with greater than 50%  respiratory  variability, suggesting right atrial pressure of 3 mmHg.    Cath 111-14-23 1.Normal left and right heart filling pressures. 2.Mild pulmonary hypertension (mean PAP 21 mmHg). 3.Normal Fick cardiac output and low normal to mildly reduced thermodilution cardiac output.   Bilateral carotid artery stenosis, unspecified    Neuro/Psych  PSYCHIATRIC DISORDERS Anxiety Depression     Neuromuscular disease negative neurological ROS  negative psych ROS   GI/Hepatic negative GI ROS, Neg liver ROS,GERD  ,,  Endo/Other  negative endocrine ROSdiabetesHypothyroidism    Renal/GU Renal diseasenegative Renal ROS  negative genitourinary   Musculoskeletal negative musculoskeletal ROS (+) Arthritis ,    Abdominal   Peds negative pediatric ROS (+)  Hematology negative hematology ROS (+)   Anesthesia Other Findings GERD (gastroesophageal reflux disease) Hypertension High cholesterol  CAD (coronary artery disease) Post-op atrial fibrillation (HCC)  CKD (chronic kidney disease), stage III (HCC) Hypothyroidism  Type II diabetes mellitus (HCC) Arthritis  History of bronchitis Wears glasses Adenomatous colon polyp Diverticulosis  ILD (interstitial lung disease) (HCC) Diastolic dysfunction  Aortic root dilatation  Shortness of breath  PAD (peripheral artery disease)  Grade I diastolic dysfunction  Chronic respiratory failure with hypoxia, on oxygen Mild pulmonary hypertension    Previous cataract surgery 09-09-22 Dr. Corinda Gubler anesthesiologist  Reproductive/Obstetrics negative OB ROS                              Anesthesia Physical Anesthesia Plan  ASA: 4  Anesthesia Plan: MAC   Post-op  Pain Management:    Induction: Intravenous  PONV Risk Score and Plan:   Airway Management Planned: Natural Airway and Nasal Cannula  Additional Equipment:   Intra-op Plan:   Post-operative Plan:   Informed Consent: I have reviewed the patients History and  Physical, chart, labs and discussed the procedure including the risks, benefits and alternatives for the proposed anesthesia with the patient or authorized representative who has indicated his/her understanding and acceptance.     Dental Advisory Given  Plan Discussed with: Anesthesiologist, CRNA and Surgeon  Anesthesia Plan Comments: (Patient consented for risks of anesthesia including but not limited to:  - adverse reactions to medications - damage to eyes, teeth, lips or other oral mucosa - nerve damage due to positioning  - sore throat or hoarseness - Damage to heart, brain, nerves, lungs, other parts of body or loss of life  Patient voiced understanding.)         Anesthesia Quick Evaluation

## 2022-10-06 NOTE — Discharge Instructions (Addendum)

## 2022-10-08 ENCOUNTER — Encounter
Admission: RE | Disposition: A | Discharge status: Home / Self Care | Payer: Self-pay | Source: Ambulatory Visit | Attending: Ophthalmology

## 2022-10-08 ENCOUNTER — Ambulatory Visit: Payer: No Typology Code available for payment source | Admitting: Anesthesiology

## 2022-10-08 ENCOUNTER — Ambulatory Visit
Admission: RE | Admit: 2022-10-08 | Discharge: 2022-10-08 | Disposition: A | Discharge status: Home / Self Care | Payer: No Typology Code available for payment source | Source: Ambulatory Visit | Attending: Ophthalmology | Admitting: Ophthalmology

## 2022-10-08 ENCOUNTER — Other Ambulatory Visit: Payer: Self-pay

## 2022-10-08 ENCOUNTER — Encounter: Payer: Self-pay | Admitting: Ophthalmology

## 2022-10-08 DIAGNOSIS — E1122 Type 2 diabetes mellitus with diabetic chronic kidney disease: Secondary | ICD-10-CM | POA: Insufficient documentation

## 2022-10-08 DIAGNOSIS — N183 Chronic kidney disease, stage 3 unspecified: Secondary | ICD-10-CM | POA: Insufficient documentation

## 2022-10-08 DIAGNOSIS — I252 Old myocardial infarction: Secondary | ICD-10-CM | POA: Diagnosis not present

## 2022-10-08 DIAGNOSIS — I700 Atherosclerosis of aorta: Secondary | ICD-10-CM | POA: Insufficient documentation

## 2022-10-08 DIAGNOSIS — Z87891 Personal history of nicotine dependence: Secondary | ICD-10-CM | POA: Diagnosis not present

## 2022-10-08 DIAGNOSIS — I272 Pulmonary hypertension, unspecified: Secondary | ICD-10-CM | POA: Insufficient documentation

## 2022-10-08 DIAGNOSIS — E7800 Pure hypercholesterolemia, unspecified: Secondary | ICD-10-CM | POA: Insufficient documentation

## 2022-10-08 DIAGNOSIS — Z79899 Other long term (current) drug therapy: Secondary | ICD-10-CM | POA: Insufficient documentation

## 2022-10-08 DIAGNOSIS — E039 Hypothyroidism, unspecified: Secondary | ICD-10-CM | POA: Diagnosis not present

## 2022-10-08 DIAGNOSIS — H2512 Age-related nuclear cataract, left eye: Secondary | ICD-10-CM | POA: Insufficient documentation

## 2022-10-08 DIAGNOSIS — K219 Gastro-esophageal reflux disease without esophagitis: Secondary | ICD-10-CM | POA: Diagnosis not present

## 2022-10-08 DIAGNOSIS — E1136 Type 2 diabetes mellitus with diabetic cataract: Secondary | ICD-10-CM | POA: Insufficient documentation

## 2022-10-08 DIAGNOSIS — I2510 Atherosclerotic heart disease of native coronary artery without angina pectoris: Secondary | ICD-10-CM | POA: Insufficient documentation

## 2022-10-08 DIAGNOSIS — I129 Hypertensive chronic kidney disease with stage 1 through stage 4 chronic kidney disease, or unspecified chronic kidney disease: Secondary | ICD-10-CM | POA: Insufficient documentation

## 2022-10-08 HISTORY — PX: CATARACT EXTRACTION W/PHACO: SHX586

## 2022-10-08 LAB — GLUCOSE, CAPILLARY: Glucose-Capillary: 153 mg/dL — ABNORMAL HIGH (ref 70–99)

## 2022-10-08 SURGERY — PHACOEMULSIFICATION, CATARACT, WITH IOL INSERTION
Anesthesia: Monitor Anesthesia Care | Laterality: Left

## 2022-10-08 MED ORDER — SIGHTPATH DOSE#1 BSS IO SOLN
INTRAOCULAR | Status: DC | PRN
  Administered 2022-10-08: 15 mL via INTRAOCULAR

## 2022-10-08 MED ORDER — TETRACAINE HCL 0.5 % OP SOLN
1.0000 [drp] | OPHTHALMIC | Status: DC | PRN
  Administered 2022-10-08 (×3): 1 [drp] via OPHTHALMIC

## 2022-10-08 MED ORDER — ARMC OPHTHALMIC DILATING DROPS
1.0000 | OPHTHALMIC | Status: DC | PRN
  Administered 2022-10-08 (×3): 1 via OPHTHALMIC

## 2022-10-08 MED ORDER — SIGHTPATH DOSE#1 BSS IO SOLN: INTRAOCULAR | Status: DC | PRN

## 2022-10-08 MED ORDER — LACTATED RINGERS IV SOLN: INTRAVENOUS | Status: DC

## 2022-10-08 MED ORDER — SIGHTPATH DOSE#1 NA HYALUR & NA CHOND-NA HYALUR IO KIT
PACK | INTRAOCULAR | Status: DC | PRN
  Administered 2022-10-08: 1 via OPHTHALMIC

## 2022-10-08 MED ORDER — FENTANYL CITRATE (PF) 100 MCG/2ML IJ SOLN
INTRAMUSCULAR | Status: DC | PRN
  Administered 2022-10-08: 50 ug via INTRAVENOUS
  Administered 2022-10-08: 25 ug via INTRAVENOUS

## 2022-10-08 MED ORDER — BRIMONIDINE TARTRATE-TIMOLOL 0.2-0.5 % OP SOLN
OPHTHALMIC | Status: DC | PRN
  Administered 2022-10-08: 1 [drp] via OPHTHALMIC

## 2022-10-08 MED ORDER — MOXIFLOXACIN HCL 0.5 % OP SOLN
OPHTHALMIC | Status: DC | PRN
  Administered 2022-10-08: .2 mL via OPHTHALMIC

## 2022-10-08 MED ORDER — PHENYLEPHRINE-KETOROLAC 1-0.3 % IO SOLN
INTRAOCULAR | Status: DC | PRN
  Administered 2022-10-08: 92 mL via OPHTHALMIC

## 2022-10-08 MED ORDER — MIDAZOLAM HCL 2 MG/2ML IJ SOLN
INTRAMUSCULAR | Status: DC | PRN
  Administered 2022-10-08: 2 mg via INTRAVENOUS

## 2022-10-08 MED ORDER — LIDOCAINE HCL (PF) 2 % IJ SOLN
INTRAOCULAR | Status: DC | PRN
  Administered 2022-10-08: 4 mL via INTRAOCULAR

## 2022-10-08 SURGICAL SUPPLY — 9 items
CATARACT SUITE SIGHTPATH (MISCELLANEOUS) ×1 IMPLANT
DISSECTOR HYDRO NUCLEUS 50X22 (MISCELLANEOUS) ×1 IMPLANT
DRSG TEGADERM 2-3/8X2-3/4 SM (GAUZE/BANDAGES/DRESSINGS) ×1 IMPLANT
FEE CATARACT SUITE SIGHTPATH (MISCELLANEOUS) ×1 IMPLANT
GLOVE SURG SYN 7.5 E (GLOVE) ×1 IMPLANT
GLOVE SURG SYN 7.5 PF PI (GLOVE) ×1 IMPLANT
GLOVE SURG SYN 8.5 E (GLOVE) ×1 IMPLANT
GLOVE SURG SYN 8.5 PF PI (GLOVE) ×1 IMPLANT
LENS IOL TECNIS EYHANCE 24.0 (Intraocular Lens) IMPLANT

## 2022-10-08 NOTE — H&P (Signed)
[facility]   Primary Care Physician:  Jerl Mina, MD Ophthalmologist: Dr. Deberah Pelton  Pre-Procedure History & Physical: HPI:  Brian Casey is a 72 y.o. male here for cataract surgery.   Past Medical History:  Diagnosis Date   Adenomatous colon polyp    Aortic root dilatation (HCC)    Noted on echo in August 2023.  Measured 39 mm.   Arthritis    "lower spine; fingers" (11/07/2015)   CAD (coronary artery disease)    a. 10/2015 NSTEMI/Cath: LM 40, LAD 70p, LCX 90ost, RI 90ost, RCA 60-70p, 90-59m/d, 40-50d; b. 10/2015 CABG x 4: LIMA->LAD, VG->Diag, VG->RI, VG->RPDA.   Chronic respiratory failure with hypoxia (HCC)    CKD (chronic kidney disease), stage III (HCC)    Diastolic dysfunction    a. 10/2021 Echo: EF 60-65%,   Diverticulosis    GERD (gastroesophageal reflux disease)    Grade I diastolic dysfunction    High cholesterol    History of bronchitis    Hypertension 10/17/2013   Hypothyroidism    ILD (interstitial lung disease) (HCC)    Mild pulmonary hypertension (HCC)    PAD (peripheral artery disease) (HCC)    s/p left carotid endarterectomy   Post-op atrial fibrillation (HCC) 11/06/2015   Shortness of breath    Type II diabetes mellitus (HCC)    type II   Wears glasses     Past Surgical History:  Procedure Laterality Date   CARDIAC CATHETERIZATION Right 11/06/2015   Procedure: Left Heart Cath and Coronary Angiography;  Surgeon: Antonieta Iba, MD;  Location: [facility] CV [facility];  Service: Cardiovascular;  Laterality: Right;   CATARACT EXTRACTION W/PHACO Right 09/09/2022   Procedure: CATARACT EXTRACTION PHACO AND INTRAOCULAR LENS PLACEMENT (IOC) RIGHT DIABETIC OMIDRIA 11.17 01:06.5;  Surgeon: Estanislado Pandy, MD;  Location: [facility];  Service: Ophthalmology;  Laterality: Right;   CHOLECYSTECTOMY N/A 01/17/2016   Procedure: LAPAROSCOPIC CHOLECYSTECTOMY WITH INTRAOPERATIVE CHOLANGIOGRAM;  Surgeon: Claud Kelp, MD;  Location: [facility] OR;   Service: General;  Laterality: N/A;   COLONOSCOPY     COLONOSCOPY N/A 11/19/2021   Procedure: COLONOSCOPY;  Surgeon: Toledo, Boykin Nearing, MD;  Location: [facility];  Service: Gastroenterology;  Laterality: N/A;   CORONARY ARTERY BYPASS GRAFT N/A 11/14/2015   Procedure: CORONARY ARTERY BYPASS GRAFTING (CABG) x 4;  Surgeon: Kerin Perna, MD;  Location: [facility] OR;  Service: Open Heart Surgery;  Laterality: N/A;   ENDARTERECTOMY Left 11/14/2015   Procedure: LEFT ENDARTERECTOMY CAROTID;  Surgeon: Maeola Harman, MD;  Location: [facility] OR;  Service: Vascular;  Laterality: Left;   ESOPHAGOGASTRODUODENOSCOPY N/A 11/19/2021   Procedure: ESOPHAGOGASTRODUODENOSCOPY (EGD);  Surgeon: Toledo, Boykin Nearing, MD;  Location: [facility] ENDOSCOPY;  Service: Gastroenterology;  Laterality: N/A;   EXCISION OF MESH N/A 06/18/2022   Procedure: EXCISION OF MESH;  Surgeon: Manus Rudd, MD;  Location: [facility] OR;  Service: General;  Laterality: N/A;   FINGER GANGLION CYST EXCISION Left X 3   "index finger X 2; thumb X 1"   HEMORRHOID SURGERY  1982   HERNIA REPAIR     INSERTION OF MESH N/A 11/16/2013   Procedure: INSERTION OF MESH;  Surgeon: Wilmon Arms. Corliss Skains, MD;  Location: [facility] OR;  Service: General;  Laterality: N/A;   IR GENERIC HISTORICAL  11/08/2015   IR PERC CHOLECYSTOSTOMY 11/08/2015 Gilmer Mor, DO [facility]   IR GENERIC HISTORICAL  12/13/2015   IR CHOLANGIOGRAM EXISTING TUBE 12/13/2015 [facility]   IR GENERIC HISTORICAL  12/25/2015   IR  CATHETER TUBE CHANGE 12/25/2015 Simonne Come, MD [facility]   IR GENERIC HISTORICAL  12/12/2015   IR RADIOLOGIST EVAL & MGMT 12/12/2015 Berdine Dance, MD [facility]   RIGHT HEART CATH Right 02/03/2022   Procedure: RIGHT HEART CATH;  Surgeon: Yvonne Kendall, MD;  Location: [facility] CV [facility];  Service: Cardiovascular;  Laterality: Right;   TEE WITHOUT CARDIOVERSION N/A 11/14/2015   Procedure: TRANSESOPHAGEAL ECHOCARDIOGRAM (TEE);  Surgeon: Kerin Perna, MD;  Location: [facility]  OR;  Service: Open Heart Surgery;  Laterality: N/A;   UMBILICAL HERNIA REPAIR N/A 11/16/2013   Procedure: UMBILICAL HERNIA REPAIR;  Surgeon: Wilmon Arms. Corliss Skains, MD;  Location: [facility];  Service: General;  Laterality: N/A;   UMBILICAL HERNIA REPAIR N/A 06/18/2022   Procedure: OPEN UMBILICAL HERNIA REPAIR;  Surgeon: Manus Rudd, MD;  Location: [facility] OR;  Service: General;  Laterality: N/A;   VEIN HARVEST Right 11/14/2015   Procedure: RIGHT LEG GREATER SAPHENOUS VEIN HARVEST;  Surgeon: Kerin Perna, MD;  Location: [name] OR;  Service: Open Heart Surgery;  Laterality: Right;    Prior to Admission medications   Medication Sig Start Date End Date Taking? Authorizing Provider  aspirin EC 81 MG tablet Take 81 mg by mouth daily. Swallow whole.   Yes [provider]  atorvastatin (LIPITOR) 40 MG tablet Take 40 mg by mouth daily.   Yes [provider]  carvedilol (COREG) 12.5 MG tablet Take 12.5 mg by mouth 2 (two) times daily with a meal.   Yes [provider]  empagliflozin (JARDIANCE) 10 MG TABS tablet Take 25 mg by mouth daily. 06/01/22  Yes [provider]  furosemide (LASIX) 20 MG tablet Take 20 mg by mouth daily. 02/09/21  Yes [provider]  loperamide (IMODIUM) 2 MG capsule Take 2 mg by mouth as needed for diarrhea or loose stools. 01/26/22  Yes [provider]  omeprazole (PRILOSEC) 20 MG capsule Take 1 capsule (20 mg total) by mouth daily. OFFICE VISIT REQUIRED FOR ADDITIONAL REFILLS 10/07/15  Yes Dianne Dun, MD  potassium chloride (KLOR-CON) 10 MEQ tablet Take 10 mEq by mouth daily. 04/14/22  Yes [provider]  predniSONE (DELTASONE) 1 MG tablet Take 8 tablets (8 mg total) by mouth daily with breakfast. 07/21/22  Yes Kalman Shan, MD  tadalafil (CIALIS) 10 MG tablet Take 10 mg by mouth daily as needed for erectile dysfunction. 09/05/19  Yes [provider]  levothyroxine (SYNTHROID) 100 MCG tablet Take 100 mcg by mouth daily  before breakfast. 07/22/21 09/16/22  [provider]    Allergies as of 08/14/2022 - Review Complete 07/21/2022  Allergen Reaction Noted   Penicillins Nausea And Vomiting and Other (See Comments)    Amlodipine Swelling 06/06/2018   Bupropion hcl     Chlorthalidone Other (See Comments) 07/20/2018   Metformin Diarrhea 07/20/2018   Nintedanib Diarrhea and Other (See Comments) 02/23/2022   Terazosin Other (See Comments) 09/05/2019    Family History  Problem Relation Age of Onset   Hypertension Mother    Diabetes Mother    Depression Mother    Colon cancer Mother    Hyperthyroidism Father    Hypertension Father    Prostate cancer Father    Diabetes Maternal Grandmother    Lung cancer Maternal Grandmother        non smoker    Social History   Socioeconomic History   Marital status: Married    Spouse name: Arline Asp   Number of children: 2  Years of education: Not on file   Highest education level: Not on file  Occupational History   Occupation: Risk manager,    Employer: [employer]   Occupation: Geneticist, molecular  Tobacco Use   Smoking status: Former    Current packs/day: 0.00    Average packs/day: 1 pack/day for 48.0 years (48.0 ttl pk-yrs)    Types: Cigarettes    Start date: 11/07/1967    Quit date: 11/07/2015    Years since quitting: 6.9   Smokeless tobacco: Never  Vaping Use   Vaping status: Never Used  Substance and Sexual Activity   Alcohol use: Yes    Alcohol/week: 3.0 standard drinks of alcohol    Types: 3 Cans of beer per week    Comment: occas   Drug use: No   Sexual activity: Yes  Other Topics Concern   Not on file  Social History Narrative   Not on file   Social Determinants of Health   Financial Resource Strain: Low Risk  (01/06/2022)   Received from [facility], [facility]   Overall Financial Resource Strain (CARDIA)    Difficulty of Paying Living Expenses: Not very hard  Food  Insecurity: No Food Insecurity (01/06/2022)   Received from [facility], [facility]   Hunger Vital Sign    Worried About Running Out of Food in the Last Year: Never true    Ran Out of Food in the Last Year: Never true  Transportation Needs: No Transportation Needs (01/06/2022)   Received from [facility], [facility]   [facility] - Transportation    In the past 12 months, has lack of transportation kept you from medical appointments or from getting medications?: No    Lack of Transportation (Non-Medical): No  Physical Activity: Not on file  Stress: Not on file  Social Connections: Not on file  Intimate Partner Violence: Not on file    Review of Systems: See HPI, otherwise negative ROS  Physical Exam: BP 138/87   Pulse 70   Temp (!) 97.5 F (36.4 C) (Temporal)   Resp 16   Ht 5\' 7"  (1.702 m)   Wt 77.5 kg   SpO2 98% Comment: 4L  BMI 26.75 kg/m  General:   Alert, cooperative in NAD Head:  Normocephalic and atraumatic. Respiratory:  Normal work of breathing. Cardiovascular:  RRR  Impression/Plan: Brian Casey is here for cataract surgery.  Risks, benefits, limitations, and alternatives regarding cataract surgery have been reviewed with the patient.  Questions have been answered.  All parties agreeable.   Estanislado Pandy, MD  10/08/2022, 7:07 AM

## 2022-10-08 NOTE — Transfer of Care (Signed)
Immediate Anesthesia Transfer of Care Note  Patient: Brian Casey  Procedure(s) Performed: CATARACT EXTRACTION PHACO AND INTRAOCULAR LENS PLACEMENT (IOC) LEFT DIABETIC OMIDRIA 12.90 01:11.8 (Left)  Patient Location: PACU  Anesthesia Type: MAC  Level of Consciousness: awake, alert  and patient cooperative  Airway and Oxygen Therapy: Patient Spontanous Breathing and Patient connected to supplemental oxygen  Post-op Assessment: Post-op Vital signs reviewed, Patient's Cardiovascular Status Stable, Respiratory Function Stable, Patent Airway and No signs of Nausea or vomiting  Post-op Vital Signs: Reviewed and stable  Complications: No notable events documented.

## 2022-10-08 NOTE — Anesthesia Postprocedure Evaluation (Signed)
Anesthesia Post Note  Patient: Brian Casey  Procedure(s) Performed: CATARACT EXTRACTION PHACO AND INTRAOCULAR LENS PLACEMENT (IOC) LEFT DIABETIC OMIDRIA 12.90 01:11.8 (Left)  Patient location during evaluation: PACU Anesthesia Type: MAC Level of consciousness: awake and alert Pain management: pain level controlled Vital Signs Assessment: post-procedure vital signs reviewed and stable Respiratory status: spontaneous breathing, nonlabored ventilation, respiratory function stable and patient connected to nasal cannula oxygen Cardiovascular status: stable and blood pressure returned to baseline Postop Assessment: no apparent nausea or vomiting Anesthetic complications: no   No notable events documented.   Last Vitals:  Vitals:   10/08/22 0758 10/08/22 0803  BP: 134/81 (!) 140/89  Pulse: 77 (!) 58  Resp: 11 19  Temp: (!) 36.1 C (!) 36.1 C  SpO2: 95% 97%    Last Pain:  Vitals:   10/08/22 0803  TempSrc:   PainSc: 0-No pain                 Marisue Humble

## 2022-10-08 NOTE — Op Note (Signed)
OPERATIVE NOTE  Brian Casey [id number] 10/08/2022   PREOPERATIVE DIAGNOSIS: Nuclear sclerotic cataract left eye. H25.12   POSTOPERATIVE DIAGNOSIS: Nuclear sclerotic cataract left eye. H25.12   PROCEDURE:  Phacoemusification with posterior chamber intraocular lens placement of the left eye  Ultrasound time: Procedure(s): CATARACT EXTRACTION PHACO AND INTRAOCULAR LENS PLACEMENT (IOC) LEFT DIABETIC OMIDRIA 12.90 01:11.8 (Left)  LENS:   Implant Name Type Inv. Item Serial No. Manufacturer Lot No. LRB No. Used Action  LENS IOL TECNIS EYHANCE 24.0 - [id number] Intraocular Lens LENS IOL TECNIS EYHANCE 24.0 [id number] [employer]  Left 1 Implanted      SURGEON:  Julious Payer. Rolley Sims, MD   ANESTHESIA:  Topical with tetracaine drops, augmented with 1% preservative-free intracameral lidocaine.   COMPLICATIONS:  None.   DESCRIPTION OF PROCEDURE:  The patient was identified in the holding room and transported to the operating room and placed in the supine position under the operating microscope.  The left eye was identified as the operative eye, which was prepped and draped in the usual sterile ophthalmic fashion.   A 1 millimeter clear-corneal paracentesis was made inferotemporally. Preservative-free 1% lidocaine mixed with 1:1,000 bisulfite-free aqueous solution of epinephrine was injected into the anterior chamber. The anterior chamber was then filled with Viscoat viscoelastic. A 2.4 millimeter keratome was used to make a clear-corneal incision superotemporally. A curvilinear capsulorrhexis was made with a cystotome and capsulorrhexis forceps. Balanced salt solution was used to hydrodissect and hydrodelineate the nucleus. Phacoemulsification was then used to remove the lens nucleus and epinucleus. The remaining cortex was then removed using the irrigation and aspiration handpiece. Provisc was then placed into the capsular bag to distend it for lens placement. A +24.00 D DIB00 intraocular lens was then  injected into the capsular bag. The remaining viscoelastic was aspirated.   Wounds were hydrated with balanced salt solution.  The anterior chamber was inflated to a physiologic pressure with balanced salt solution.  No wound leaks were noted. Vigamox was injected intracamerally.  Timolol and Brimonidine drops were applied to the eye.  The patient was taken to the recovery room in stable condition without complications of anesthesia or surgery.  [name] 10/08/2022, 7:56 AM

## 2022-10-09 ENCOUNTER — Encounter: Payer: Self-pay | Admitting: Ophthalmology

## 2022-10-16 ENCOUNTER — Ambulatory Visit: Payer: No Typology Code available for payment source | Admitting: Adult Health

## 2022-11-20 ENCOUNTER — Ambulatory Visit: Payer: No Typology Code available for payment source | Admitting: Internal Medicine

## 2022-11-28 ENCOUNTER — Encounter: Payer: Self-pay | Admitting: Internal Medicine

## 2022-11-30 ENCOUNTER — Telehealth: Payer: Self-pay | Admitting: Internal Medicine

## 2022-11-30 NOTE — Telephone Encounter (Signed)
Yes this is fine.

## 2022-12-02 ENCOUNTER — Encounter (HOSPITAL_BASED_OUTPATIENT_CLINIC_OR_DEPARTMENT_OTHER): Payer: Self-pay

## 2022-12-02 MED ORDER — PREDNISONE 10 MG PO TABS: 10.0000 mg | ORAL_TABLET | Freq: Every day | ORAL | 0 refills | Status: DC

## 2022-12-02 NOTE — Telephone Encounter (Signed)
Rx for 10 mg every day prednisone sent to pharmacy as per provider ok.

## 2022-12-22 ENCOUNTER — Ambulatory Visit: Payer: Medicare HMO | Admitting: Urology

## 2022-12-29 ENCOUNTER — Ambulatory Visit: Payer: Medicare HMO | Admitting: Urology

## 2023-01-12 ENCOUNTER — Encounter: Payer: Self-pay | Admitting: Internal Medicine

## 2023-01-12 ENCOUNTER — Ambulatory Visit (INDEPENDENT_AMBULATORY_CARE_PROVIDER_SITE_OTHER): Payer: No Typology Code available for payment source | Admitting: Internal Medicine

## 2023-01-12 VITALS — BP 136/74 | HR 74 | Ht 67.0 in | Wt 162.6 lb

## 2023-01-12 DIAGNOSIS — Z5181 Encounter for therapeutic drug level monitoring: Secondary | ICD-10-CM

## 2023-01-12 DIAGNOSIS — J849 Interstitial pulmonary disease, unspecified: Secondary | ICD-10-CM

## 2023-01-12 DIAGNOSIS — J679 Hypersensitivity pneumonitis due to unspecified organic dust: Secondary | ICD-10-CM | POA: Diagnosis not present

## 2023-01-12 DIAGNOSIS — Z7952 Long term (current) use of systemic steroids: Secondary | ICD-10-CM | POA: Diagnosis not present

## 2023-01-12 DIAGNOSIS — Z77090 Contact with and (suspected) exposure to asbestos: Secondary | ICD-10-CM | POA: Diagnosis not present

## 2023-01-12 DIAGNOSIS — Z23 Encounter for immunization: Secondary | ICD-10-CM | POA: Diagnosis not present

## 2023-01-12 DIAGNOSIS — Z79899 Other long term (current) drug therapy: Secondary | ICD-10-CM

## 2023-01-12 NOTE — Patient Instructions (Addendum)
ICD-10-CM   1. ILD (interstitial lung disease) (HCC)  J84.9     2. Hypersensitivity pneumonitis (HCC)  J67.9     3. Current chronic use of systemic steroids  Z79.52     4. H/O asbestos exposure  Z77.090           - stable clinically  - pulse ox 96% RA at rest -Currently on low-dose protocol [67 mg 2 pills 3 times daily] of pirfenidone and tolerating it well  -But missing the afternoon dose due to schedule issues around wife's health -As discussed before there is no evidence of asbestos pleural plaques to confidently tell you that the disease is asbestosis from a legal standpoint. 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 environment and asbestos exposure  -Noted that 10 L oxygen concentration at home is too noisy -Adequate weight loss and currently 8 ideal body weight - Ongoing prednisone intake  Plan - check LFT 01/12/2023 -High-dose flu shot today - Maintain current weight because you are at ideal body weight right now -Tolerating 10 mg of prednisone daily; continue 10 mg daily dose -We can look at reducing this in the future -Continue pirfenidone   -However, you can try 801 mg [large pill] 2 times daily) which will still be low-dose protocol -Continue 4 L -6L N nasal cannula with exertion; goal pulse ox greater than 86%  - CMA 01/12/2023 -to order 5 L and oxygen concentration through the [facility].    - spirometry and dlco in [facility] clinic in 12 weeks -High-resolution CT chest in 12 weeks -Cancel office visit with Rikki Spearing on 10/16/2022 - consider clinical trials -   - Tyvaso trials in the future     #Obesity with a BMI greater than 30 and hyperglycemia  - Glad there is weight loss - BMI now 25.5 and ideal body weight  Plan  - Maintain current weight -     Follow-up - Return to see Dr. Marchelle Gearing)( 30-minute visit in l 12 weeks but after PFT and CT scan  - symptims score and sit/stand a

## 2023-01-12 NOTE — Addendum Note (Signed)
Addended by: Glynda Jaeger on: 01/12/2023 02:37 PM   Modules accepted: Orders

## 2023-01-12 NOTE — Addendum Note (Signed)
Addended by: Glynda Jaeger on: 01/12/2023 11:58 AM   Modules accepted: Orders

## 2023-01-12 NOTE — Progress Notes (Addendum)
and expiratory imaging demonstrates some mild air trapping indicative of mild small airways disease. No definite suspicious appearing pulmonary nodules or masses are noted. No pleural effusions.   Upper Abdomen: Aortic atherosclerosis.  Status post cholecystectomy.   Musculoskeletal: Median sternotomy wires. There are no aggressive appearing lytic or blastic lesions noted in the visualized portions of the skeleton.   IMPRESSION: 1. The appearance of the lungs is indicative of interstitial lung disease, with minimal progression compared to the most recent prior study, but definitive progression when compared to more remote prior examinations. 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 2. Aortic atherosclerosis, in addition to left main and three-vessel coronary artery disease. Status post median sternotomy for CABG including LIMA to the LAD.   Aortic Atherosclerosis  (ICD10-I70.0).     Electronically Signed   By: Trudie Reed M.D.   On: 11/15/2021 06:28    ECHO 11/12/21  IMPRESSIONS     1. Left ventricular ejection fraction, by estimation, is 60 to 65%. The  left ventricle has normal function. The left ventricle has no regional  wall motion abnormalities. There is mild left ventricular hypertrophy.  Left ventricular diastolic parameters  are consistent with Grade I diastolic dysfunction (impaired relaxation).   2. Right ventricular systolic function is normal. The right ventricular  size is normal.   3. The mitral valve is normal in structure. No evidence of mitral valve  regurgitation.   4. The aortic valve is tricuspid. Aortic valve regurgitation is not  visualized. Aortic valve sclerosis is present, with no evidence of aortic  valve stenosis.   5. Aortic dilatation noted. There is mild dilatation of the aortic root,  measuring 39 mm.   6. The inferior vena cava is normal in size with greater than 50%  respiratory variability, suggesting right atrial pressure of 3 mmHg.    Aug 2023 ILD confi with Dr Dorothey Baseman  In 2019 early ILD with reticuation. SIgnifcant progressiont hrough latest CT in May 2023. Lot of upper lobe > Lower lobe back then and now. OVerall pattern: ALTERNATIVE CATEGRORy. DDx is FIBROTIC HP (2021 Exp phase imaging was inadequate , 2020 there was mild air trappng). In May 2023 - more GGO  ? flare. Overall patch peribronchosvascular. NOT c/w UIP. Has pleural thicekning on left but prob due to cardiac urgery. No calcifiied pleural plaques  Consider bix if Risk ok   OV 01/13/2022  Subjective:  Patient ID: Brian Casey, male , DOB: 17-Oct-1950 , age 73 y.o. , MRN: [id number] , ADDRESS: [address] [place] [address] PCP Jerl Mina, MD Patient Care Team: Jerl Mina, MD as PCP - General (Family Medicine) Antonieta Iba, MD as Consulting Physician (Cardiology) Lovett Sox, MD as Consulting  Physician (Cardiothoracic Surgery)  This Provider for this visit: Treatment Team:  Attending Provider: Kalman Shan, MD    01/13/2022 -   Chief Complaint  Patient presents with   Follow-up    Follow-up PT states breathing is not better, DOE   ILD - workup complete. 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 -  setting [place] 198-. 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 Worked for [employer] in [place]  PULMONARY TOXICITY HISTORY (27 items):  Prednisone and Immuran since May 2023  INVESTIGATIONS: 2017 CT abdomen lung cuts  He had left-sided pleural effusion and reticular abnormalities suggestive of ILD [back then it was  called as bibasilar atelectasis]  2017 August CT scan of the chest: Bilateral pleural effusions some upper lobe groundglass opacities.  No clear-cut evidence of ILD:  2019 June CT scan of the chest: Read by Dr. Ashley Murrain.  Concerning for ILD.  Reticulation also left-sided pleural effusion  May 2021 high-resolution CT chest: Read by Dr. Ashley Murrain.  Indeterminate pattern for UIP versus alternate diagnosis.Marland Kitchen  Definite of ILD.  There is subpleural sparing.  Left-sided pleural effusion present.  Findings deemed is unchanged from prior.  I personally agree with the findings  April 2022 CT chest without contrast [not a high-resolution CT chest].  Personally thought ILD is worse.  Radiology seems to agree  November 2022 in May 2023 CT scan of the chest [again not high-resolution] definite worsening of ILD changes.  Increased groundglass component.  May 2023: CT - progressive ILD. Worsning GGO and reticulation. Trace left effuion   UGI score 11/19/21:: Diffuse mild inflammation characterized by congestion (edema) and erosions was found in the entire examined stomach.   OV 11/20/2021  Subjective:  Patient ID: Brian Casey, male , DOB: 29-Jul-1950 , age 50 y.o. , MRN: [id number] , ADDRESS: [address] [place] [address] PCP Jerl Mina, MD Patient Care Team: Jerl Mina, MD as PCP - General (Family Medicine) Antonieta Iba, MD as Consulting Physician (Cardiology) Lovett Sox, MD as Consulting Physician (Cardiothoracic Surgery)  This Provider for this visit: Treatment Team:  Attending Provider: Kalman Shan, MD   11/20/2021 -returns for video visit.  ILD work-up in progress.   HPI Brian Casey 72 y.o. -  #ILD work-up: After his last visit I presented him at the thoracic multidisciplinary conference.  Dr. Dorothey Baseman felt the CT scan was more consistent with alternative pattern possibly fibrotic NSIP or even HP given the upper lobe predominance and air trapping.  He  subsequently had a high-resolution CT scan of the chest that was read by Dr. Trudie Reed who feels CT scan is more consistent with chronic HP.  We went over the exposures again there is no current exposure.  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  While he remembers asbestos exposure he does not remember mold exposure but it was definitely water and there was a lot of dampness.  There is no evidence of asbestos plaques or UIP pattern on the CT scan of the chest.  Nevertheless it is progressive.  We discussed the differential diagnosis currently of chronic HP versus NSIP versus asbestosis versus IPF.  It appears that most likely this is chronic HP.  Based on this and the progression my recommendation to him was that we would start antifibrotic's.  Did indicate to him that there is a role for prednisone and immunomodulators in the setting of chronic NSIP or HP but given the side effect profile with prednisone [fatigue and insomnia] and also future potential risk  Jardiance right now.  He is disappointed that [facility] declined him for lung transplant partly because of his diabetes but he feels that his blood sugars better controlled now with Jardiance and also weight loss.  His wife is here with him.  She survived her cardiac bypass and long hospital stay.  She feels well.  She also affirms that his diabetes is under better control.  We took a joint decision to rerefer him to [employer].  Overall he is stable.  He did have safety labs mid June 2024 which showed elevated transaminase but when repeated September 11, 2022 these liver enzymes are normal.  I reviewed the external records for this.  He is now on his baseline prednisone of 8 mg/day.  He uses oxygen with exertion.  Of note he is worried that he has asbestosis from occupational exposure.  He showed me a letter that he had a B reader review his films.  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  He did supply supplemental findings later after the visit.  The B reader did indicate concern for pneumoconiosis.  The B reader was satisfied with pleural thickening along with exposure history and latency and presence of ILD.  In August 2023 in our case conference it was felt that he did not have pleural classification.  I did tell him that even though he had asbestos exposure and even though he  has pulmonary fibrosis sometimes the diagnose of asbestosis Royetta Car is ILD due to asbestos exposure is more confident only by this asbestos pleural plaques.  On the other hand I did say there is reason to believe he has occupational aided lung disease given the fact he worked in the heavy damp area on the CT scan is more consistent with hypersensitive pneumonitis.  However there is a strong asbestos exposure history according to him and his latency..  Therefore I do agree and concede that even in the absence of pleural plaques he could easily have asbestosis.  Only lung biopsy can sort out if he has hypersensitive pneumonitis or not but it is too risky to do that.   He is able to talk to his attorney about this.      OV 01/12/2023  Subjective:  Patient ID: Brian Casey, male , DOB: 01-19-1951 , age 8 y.o. , MRN: [id number] , ADDRESS: [address] [place] [address] PCP Jerl Mina, MD Patient Care Team: Jerl Mina, MD as PCP - General (Family Medicine) Antonieta Iba, MD as PCP - Cardiology (Cardiology) Lovett Sox, MD as Consulting Physician (Cardiothoracic Surgery)  This Provider for this visit: Treatment Team:  Attending Provider: Kalman Shan, MD    01/12/2023 -   Chief Complaint  Patient presents with   Follow-up    Pt denies any concerns     Chronic HP  - hx of asbestos exposure at work + damp environment  -On chronic daily prednisone; reduced to 8 mg/day April 2024.-> this visit he said he is taking 10mg  per day  -August 2023 multidisciplinary case conference: Pleural thickening on the left side but no calcified pleural plaques and 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  - failed ofev -  GI and weight loss - - Intolerant to Ofev 2023  - prior on immuran    Esbriet started February 2024  -Changed to low dose protocol in 2024 by nurse practitioner Tammy Parrott  Chronic Resp failure 4L [place] Obesity  - goal  weight  Discussed transplant: wife dealing with aneurysm thoracoabdominal and is awaitig surgery in [facility] with a device in [place].  They are busy with all this. Surgery is in Q2 2024 and is taking a lot of her time.  This can prove a challenge. Wife is only 1 caregiver. There are kids but they are working. Living in [place]. But after discussion willing to see them.   - antifibrotic : did not tolerate ofev.  We discussed esbriet. Explained side effects. Explained dosing. Will have to go through the [facility]. He Is willing. BRiefly discussed immuran and cellcept as optoons  - exertional hypoxemia: advised to increase o2 with exertion. In Oct we needed to increase to [facility]. Home concerntrator does not go to 6l [place] He wants order to go through CAT at the [facility]. HE says our [facility] knows the [facility] contact  Right heart cath 02/03/2022- PAP . -> does not meet criteria for tyvaso    06/05/2022 Follow up : ILD, O2 RF  Patient presents for a 6-week follow-up.  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  He has a history of heavy exposure to asbestos with previous work at [employer].  He has previously tried [employer] but was intolerant.  Patient started on Esbriet the beginning of February 2024.  Patient was tolerating well up until he started Esbriet 2 capsules 3 times daily.  Had significant nausea and decreased appetite.  Patient was called in Zofran started on a drug holiday.  Patient says his symptoms immediately stopped when he stopped his Esbriet.  He did not have to use any Zofran.  He had no vomiting or diarrhea.  Patient says he is recently restarted back on Esbriet 2 weeks ago.  He is currently on 1 capsule twice daily and is tolerating well.  We discussed going up very slowly on the Esbriet to see if he can tolerate.  Recent lab work showed a slight increase in his ALT at 63.  Patient is on chronic steroids with prednisone 10  mg.  He does have diabetes and is recently been started on Jardiance. Patient had PFTs done May 07, 2022 that showed stable lung function with FEV1 at 43%, ratio 79, FVC 40%, DLCO 24%.  This is similar to September 2023.  Walk test in the office shows stable oxygen demands -require 6 L of oxygen on continuous flow to maintain O2 saturations while walking.  At rest patient says he does not require any oxygen at all cannot sit still without his oxygen on and keep his sats above 90%.  Today in the office O2 saturations at rest was 90% on room air.  As soon as the patient stands up or starts to walk he does require oxygen because his O2 saturations dropped into the 80s.  Patient denies any hemoptysis, chest pain, orthopnea. Patient has completed pulmonary rehab in the past.  Patient says he is not very active at home.  Is somewhat sedentary.  We discussed increasing his activity as tolerated.  Patient does have upcoming surgery at the end of this month for a umbilical hernia repair.   OV 07/21/2022  Subjective:  Patient ID: Brian Casey, male , DOB: June 08, 1950 , age 39 y.o. , MRN: [id number] , ADDRESS: [address] [place] [address] PCP Jerl Mina, MD Patient Care Team: Jerl Mina, MD as PCP - General (Family Medicine) Antonieta Iba, MD as PCP - Cardiology (Cardiology) Lovett Sox, MD as Consulting Physician (Cardiothoracic Surgery)  Jardiance right now.  He is disappointed that [facility] declined him for lung transplant partly because of his diabetes but he feels that his blood sugars better controlled now with Jardiance and also weight loss.  His wife is here with him.  She survived her cardiac bypass and long hospital stay.  She feels well.  She also affirms that his diabetes is under better control.  We took a joint decision to rerefer him to [employer].  Overall he is stable.  He did have safety labs mid June 2024 which showed elevated transaminase but when repeated September 11, 2022 these liver enzymes are normal.  I reviewed the external records for this.  He is now on his baseline prednisone of 8 mg/day.  He uses oxygen with exertion.  Of note he is worried that he has asbestosis from occupational exposure.  He showed me a letter that he had a B reader review his films.  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  He did supply supplemental findings later after the visit.  The B reader did indicate concern for pneumoconiosis.  The B reader was satisfied with pleural thickening along with exposure history and latency and presence of ILD.  In August 2023 in our case conference it was felt that he did not have pleural classification.  I did tell him that even though he had asbestos exposure and even though he  has pulmonary fibrosis sometimes the diagnose of asbestosis Royetta Car is ILD due to asbestos exposure is more confident only by this asbestos pleural plaques.  On the other hand I did say there is reason to believe he has occupational aided lung disease given the fact he worked in the heavy damp area on the CT scan is more consistent with hypersensitive pneumonitis.  However there is a strong asbestos exposure history according to him and his latency..  Therefore I do agree and concede that even in the absence of pleural plaques he could easily have asbestosis.  Only lung biopsy can sort out if he has hypersensitive pneumonitis or not but it is too risky to do that.   He is able to talk to his attorney about this.      OV 01/12/2023  Subjective:  Patient ID: Brian Casey, male , DOB: 01-19-1951 , age 8 y.o. , MRN: [id number] , ADDRESS: [address] [place] [address] PCP Jerl Mina, MD Patient Care Team: Jerl Mina, MD as PCP - General (Family Medicine) Antonieta Iba, MD as PCP - Cardiology (Cardiology) Lovett Sox, MD as Consulting Physician (Cardiothoracic Surgery)  This Provider for this visit: Treatment Team:  Attending Provider: Kalman Shan, MD    01/12/2023 -   Chief Complaint  Patient presents with   Follow-up    Pt denies any concerns     Chronic HP  - hx of asbestos exposure at work + damp environment  -On chronic daily prednisone; reduced to 8 mg/day April 2024.-> this visit he said he is taking 10mg  per day  -August 2023 multidisciplinary case conference: Pleural thickening on the left side but no calcified pleural plaques and 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  - failed ofev -  GI and weight loss - - Intolerant to Ofev 2023  - prior on immuran    Esbriet started February 2024  -Changed to low dose protocol in 2024 by nurse practitioner Tammy Parrott  Chronic Resp failure 4L [place] Obesity  - goal  weight  setting [place]-. 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 Worked for [employer] in [place]  PULMONARY TOXICITY HISTORY (27 items):  Prednisone and Immuran since May 2023  INVESTIGATIONS: 2017 CT abdomen lung cuts  He had left-sided pleural effusion and reticular abnormalities suggestive of ILD [back then it was  called as bibasilar atelectasis]  2017 August CT scan of the chest: Bilateral pleural effusions some upper lobe groundglass opacities.  No clear-cut evidence of ILD:  2019 June CT scan of the chest: Read by Dr. Ashley Murrain.  Concerning for ILD.  Reticulation also left-sided pleural effusion  May 2021 high-resolution CT chest: Read by Dr. Ashley Murrain.  Indeterminate pattern for UIP versus alternate diagnosis.Marland Kitchen  Definite of ILD.  There is subpleural sparing.  Left-sided pleural effusion present.  Findings deemed is unchanged from prior.  I personally agree with the findings  April 2022 CT chest without contrast [not a high-resolution CT chest].  Personally thought ILD is worse.  Radiology seems to agree  November 2022 in May 2023 CT scan of the chest [again not high-resolution] definite worsening of ILD changes.  Increased groundglass component.  May 2023: CT - progressive ILD. Worsning GGO and reticulation. Trace left effuion   UGI score 11/19/21:: Diffuse mild inflammation characterized by congestion (edema) and erosions was found in the entire examined stomach.   OV 11/20/2021  Subjective:  Patient ID: Brian Casey, male , DOB: 29-Jul-1950 , age 50 y.o. , MRN: [id number] , ADDRESS: [address] [place] [address] PCP Jerl Mina, MD Patient Care Team: Jerl Mina, MD as PCP - General (Family Medicine) Antonieta Iba, MD as Consulting Physician (Cardiology) Lovett Sox, MD as Consulting Physician (Cardiothoracic Surgery)  This Provider for this visit: Treatment Team:  Attending Provider: Kalman Shan, MD   11/20/2021 -returns for video visit.  ILD work-up in progress.   HPI Brian Casey 72 y.o. -  #ILD work-up: After his last visit I presented him at the thoracic multidisciplinary conference.  Dr. Dorothey Baseman felt the CT scan was more consistent with alternative pattern possibly fibrotic NSIP or even HP given the upper lobe predominance and air trapping.  He  subsequently had a high-resolution CT scan of the chest that was read by Dr. Trudie Reed who feels CT scan is more consistent with chronic HP.  We went over the exposures again there is no current exposure.  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  While he remembers asbestos exposure he does not remember mold exposure but it was definitely water and there was a lot of dampness.  There is no evidence of asbestos plaques or UIP pattern on the CT scan of the chest.  Nevertheless it is progressive.  We discussed the differential diagnosis currently of chronic HP versus NSIP versus asbestosis versus IPF.  It appears that most likely this is chronic HP.  Based on this and the progression my recommendation to him was that we would start antifibrotic's.  Did indicate to him that there is a role for prednisone and immunomodulators in the setting of chronic NSIP or HP but given the side effect profile with prednisone [fatigue and insomnia] and also future potential risk  prsents with wife for followup. He and wife are quire worried about his health and life expectancy (few to several years) and quality of life He has sveral concrns  Easy destruation- sometimes to 60% and 75%. Always with eerrtion and relieved by rest but there is variability. He is unsure of quality of pulse ox probe but appears same thing inr ehab based on test chart review. STill here able to walk ADL distances ith 6L and without desats Echo shows Gr 1 DDx - has seen cards Saw Dr Mariah Milling - has not had [facility] Feels rehab did not help him though distance improved fom 400+ feet to 700 + feet ILD Symptoms: feels getting worse. Needing more o2 Not yet started night o2: ONO results are finally in but with PCP Jerl Mina, MD who is at [facility] clinic Rx  On ofev but having severe diarrhea. Takes immodium in reactionary fashion every other day. Drinks coffee daily.  Drinks "no sugar added" apple juice Rx - continue prednisone 10mg  per day Pointd out he worked in [employer] [place] for [employer] Prognosis - explained could be few to several years but with small % declining rapidly in < 1 year.  In his case PFTs show sever restriction and dLCO Also unintenitional weight loss due to ofev - but he sees this as positive due to obsity Has new low K 3 meq 12/31/21 and was wondering if due to prednisone  (unlikely) or diarrhea (possibly yes) Abd hernia - surgery not in the works Obesity - BMI is > 30 and is a barrier to health  =     OV 03/12/2022  Subjective:  Patient ID: Brian Casey, male , DOB: 10/21/1950 , age 68 y.o. , MRN: [id number] , ADDRESS: [address] [place] [address] PCP Jerl Mina, MD Patient Care Team: Jerl Mina, MD as PCP - General (Family Medicine) Antonieta Iba, MD as PCP - Cardiology (Cardiology) Lovett Sox, MD as Consulting Physician (Cardiothoracic Surgery)   Type of visit: Video Virtual Visit Identification of patient Brian Casey with [birth year] and MRN [id number] - 2 person identifier Risks: Risks, benefits, limitations of telephone visit explained. Patient understood and verbalized agreement to proceed Anyone else on call: wife Patient location: home This provider location: [address]; [place]; [place] [address]. [facility]. [phone]    This Provider for this visit: Treatment Team:  Attending Provider: Kalman Shan, MD  Chronic HP  - failed ofev -  GI and weight loss  - prior on immuran Chronic Resp failure 4L [place] Obesity  - goal weight 170-175#  03/12/2022 -  No chief complaint on file.    HPI SHAMARION CRUPI 72 y.o. -stopped ofev 02/16/22. Recent increase in cough. PCP office was positive for flu 03/05/22. Since then cough is worse. Rx with Tamiflu but has not helped. Using hydrocodone more now but still has cough at night. Slowly improving though.  On 02/23/22 his bili was 1.4 (After stopping ofev) but rechck 03/03/22 bilis is 1.2. Currently 4L [place] day and at night 2L [place] (has gotten the [place] Night o2). Wife says even with exertion at 4L [place] drops to 77% . Unable to do "Anything" without desats. Currently remains on 10mg  prednisone. HE has challenge going up on prednisone diue to insominia. But now PCP has him on xanax and is helping sleep . So he feels he can challenge himself  with higher dose prednisone   ILD Rx:   -  Discussed transplant: wife dealing with aneurysm thoracoabdominal and is awaitig surgery in [facility] with a device in [place].  They are busy with all this. Surgery is in Q2 2024 and is taking a lot of her time.  This can prove a challenge. Wife is only 1 caregiver. There are kids but they are working. Living in [place]. But after discussion willing to see them.   - antifibrotic : did not tolerate ofev.  We discussed esbriet. Explained side effects. Explained dosing. Will have to go through the [facility]. He Is willing. BRiefly discussed immuran and cellcept as optoons  - exertional hypoxemia: advised to increase o2 with exertion. In Oct we needed to increase to [facility]. Home concerntrator does not go to [place] [place] He wants order to go through CAT at the [facility]. HE says our [facility] knows the [facility] contact  Right heart cath 02/03/2022- PAP . -> does not meet criteria for tyvaso    06/05/2022 Follow up : ILD, O2 RF  Patient presents for a 6-week follow-up.  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  He has a history of heavy exposure to asbestos with previous work at [employer].  He has previously tried [employer] but was intolerant.  Patient started on Esbriet the beginning of February 2024.  Patient was tolerating well up until he started Esbriet 2 capsules 3 times daily.  Had significant nausea and decreased appetite.  Patient was called in Zofran started on a drug holiday.  Patient says his symptoms immediately stopped when he stopped his Esbriet.  He did not have to use any Zofran.  He had no vomiting or diarrhea.  Patient says he is recently restarted back on Esbriet 2 weeks ago.  He is currently on 1 capsule twice daily and is tolerating well.  We discussed going up very slowly on the Esbriet to see if he can tolerate.  Recent lab work showed a slight increase in his ALT at 63.  Patient is on chronic steroids with prednisone 10  mg.  He does have diabetes and is recently been started on Jardiance. Patient had PFTs done May 07, 2022 that showed stable lung function with FEV1 at 43%, ratio 79, FVC 40%, DLCO 24%.  This is similar to September 2023.  Walk test in the office shows stable oxygen demands -require 6 L of oxygen on continuous flow to maintain O2 saturations while walking.  At rest patient says he does not require any oxygen at all cannot sit still without his oxygen on and keep his sats above 90%.  Today in the office O2 saturations at rest was 90% on room air.  As soon as the patient stands up or starts to walk he does require oxygen because his O2 saturations dropped into the 80s.  Patient denies any hemoptysis, chest pain, orthopnea. Patient has completed pulmonary rehab in the past.  Patient says he is not very active at home.  Is somewhat sedentary.  We discussed increasing his activity as tolerated.  Patient does have upcoming surgery at the end of this month for a umbilical hernia repair.   OV 07/21/2022  Subjective:  Patient ID: Brian Casey, male , DOB: June 08, 1950 , age 39 y.o. , MRN: [id number] , ADDRESS: [address] [place] [address] PCP Jerl Mina, MD Patient Care Team: Jerl Mina, MD as PCP - General (Family Medicine) Antonieta Iba, MD as PCP - Cardiology (Cardiology) Lovett Sox, MD as Consulting Physician (Cardiothoracic Surgery)  Jardiance right now.  He is disappointed that [facility] declined him for lung transplant partly because of his diabetes but he feels that his blood sugars better controlled now with Jardiance and also weight loss.  His wife is here with him.  She survived her cardiac bypass and long hospital stay.  She feels well.  She also affirms that his diabetes is under better control.  We took a joint decision to rerefer him to [employer].  Overall he is stable.  He did have safety labs mid June 2024 which showed elevated transaminase but when repeated September 11, 2022 these liver enzymes are normal.  I reviewed the external records for this.  He is now on his baseline prednisone of 8 mg/day.  He uses oxygen with exertion.  Of note he is worried that he has asbestosis from occupational exposure.  He showed me a letter that he had a B reader review his films.  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  He did supply supplemental findings later after the visit.  The B reader did indicate concern for pneumoconiosis.  The B reader was satisfied with pleural thickening along with exposure history and latency and presence of ILD.  In August 2023 in our case conference it was felt that he did not have pleural classification.  I did tell him that even though he had asbestos exposure and even though he  has pulmonary fibrosis sometimes the diagnose of asbestosis Royetta Car is ILD due to asbestos exposure is more confident only by this asbestos pleural plaques.  On the other hand I did say there is reason to believe he has occupational aided lung disease given the fact he worked in the heavy damp area on the CT scan is more consistent with hypersensitive pneumonitis.  However there is a strong asbestos exposure history according to him and his latency..  Therefore I do agree and concede that even in the absence of pleural plaques he could easily have asbestosis.  Only lung biopsy can sort out if he has hypersensitive pneumonitis or not but it is too risky to do that.   He is able to talk to his attorney about this.      OV 01/12/2023  Subjective:  Patient ID: Brian Casey, male , DOB: 01-19-1951 , age 8 y.o. , MRN: [id number] , ADDRESS: [address] [place] [address] PCP Jerl Mina, MD Patient Care Team: Jerl Mina, MD as PCP - General (Family Medicine) Antonieta Iba, MD as PCP - Cardiology (Cardiology) Lovett Sox, MD as Consulting Physician (Cardiothoracic Surgery)  This Provider for this visit: Treatment Team:  Attending Provider: Kalman Shan, MD    01/12/2023 -   Chief Complaint  Patient presents with   Follow-up    Pt denies any concerns     Chronic HP  - hx of asbestos exposure at work + damp environment  -On chronic daily prednisone; reduced to 8 mg/day April 2024.-> this visit he said he is taking 10mg  per day  -August 2023 multidisciplinary case conference: Pleural thickening on the left side but no calcified pleural plaques and 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  - failed ofev -  GI and weight loss - - Intolerant to Ofev 2023  - prior on immuran    Esbriet started February 2024  -Changed to low dose protocol in 2024 by nurse practitioner Tammy Parrott  Chronic Resp failure 4L [place] Obesity  - goal  weight  Discussed transplant: wife dealing with aneurysm thoracoabdominal and is awaitig surgery in [facility] with a device in [place].  They are busy with all this. Surgery is in Q2 2024 and is taking a lot of her time.  This can prove a challenge. Wife is only 1 caregiver. There are kids but they are working. Living in [place]. But after discussion willing to see them.   - antifibrotic : did not tolerate ofev.  We discussed esbriet. Explained side effects. Explained dosing. Will have to go through the [facility]. He Is willing. BRiefly discussed immuran and cellcept as optoons  - exertional hypoxemia: advised to increase o2 with exertion. In Oct we needed to increase to [facility]. Home concerntrator does not go to [place] [place] He wants order to go through CAT at the [facility]. HE says our [facility] knows the [facility] contact  Right heart cath 02/03/2022- PAP . -> does not meet criteria for tyvaso    06/05/2022 Follow up : ILD, O2 RF  Patient presents for a 6-week follow-up.  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  He has a history of heavy exposure to asbestos with previous work at [employer].  He has previously tried [employer] but was intolerant.  Patient started on Esbriet the beginning of February 2024.  Patient was tolerating well up until he started Esbriet 2 capsules 3 times daily.  Had significant nausea and decreased appetite.  Patient was called in Zofran started on a drug holiday.  Patient says his symptoms immediately stopped when he stopped his Esbriet.  He did not have to use any Zofran.  He had no vomiting or diarrhea.  Patient says he is recently restarted back on Esbriet 2 weeks ago.  He is currently on 1 capsule twice daily and is tolerating well.  We discussed going up very slowly on the Esbriet to see if he can tolerate.  Recent lab work showed a slight increase in his ALT at 63.  Patient is on chronic steroids with prednisone 10  mg.  He does have diabetes and is recently been started on Jardiance. Patient had PFTs done May 07, 2022 that showed stable lung function with FEV1 at 43%, ratio 79, FVC 40%, DLCO 24%.  This is similar to September 2023.  Walk test in the office shows stable oxygen demands -require 6 L of oxygen on continuous flow to maintain O2 saturations while walking.  At rest patient says he does not require any oxygen at all cannot sit still without his oxygen on and keep his sats above 90%.  Today in the office O2 saturations at rest was 90% on room air.  As soon as the patient stands up or starts to walk he does require oxygen because his O2 saturations dropped into the 80s.  Patient denies any hemoptysis, chest pain, orthopnea. Patient has completed pulmonary rehab in the past.  Patient says he is not very active at home.  Is somewhat sedentary.  We discussed increasing his activity as tolerated.  Patient does have upcoming surgery at the end of this month for a umbilical hernia repair.   OV 07/21/2022  Subjective:  Patient ID: Brian Casey, male , DOB: June 08, 1950 , age 39 y.o. , MRN: [id number] , ADDRESS: [address] [place] [address] PCP Jerl Mina, MD Patient Care Team: Jerl Mina, MD as PCP - General (Family Medicine) Antonieta Iba, MD as PCP - Cardiology (Cardiology) Lovett Sox, MD as Consulting Physician (Cardiothoracic Surgery)  Jardiance right now.  He is disappointed that [facility] declined him for lung transplant partly because of his diabetes but he feels that his blood sugars better controlled now with Jardiance and also weight loss.  His wife is here with him.  She survived her cardiac bypass and long hospital stay.  She feels well.  She also affirms that his diabetes is under better control.  We took a joint decision to rerefer him to [employer].  Overall he is stable.  He did have safety labs mid June 2024 which showed elevated transaminase but when repeated September 11, 2022 these liver enzymes are normal.  I reviewed the external records for this.  He is now on his baseline prednisone of 8 mg/day.  He uses oxygen with exertion.  Of note he is worried that he has asbestosis from occupational exposure.  He showed me a letter that he had a B reader review his films.  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  He did supply supplemental findings later after the visit.  The B reader did indicate concern for pneumoconiosis.  The B reader was satisfied with pleural thickening along with exposure history and latency and presence of ILD.  In August 2023 in our case conference it was felt that he did not have pleural classification.  I did tell him that even though he had asbestos exposure and even though he  has pulmonary fibrosis sometimes the diagnose of asbestosis Royetta Car is ILD due to asbestos exposure is more confident only by this asbestos pleural plaques.  On the other hand I did say there is reason to believe he has occupational aided lung disease given the fact he worked in the heavy damp area on the CT scan is more consistent with hypersensitive pneumonitis.  However there is a strong asbestos exposure history according to him and his latency..  Therefore I do agree and concede that even in the absence of pleural plaques he could easily have asbestosis.  Only lung biopsy can sort out if he has hypersensitive pneumonitis or not but it is too risky to do that.   He is able to talk to his attorney about this.      OV 01/12/2023  Subjective:  Patient ID: Brian Casey, male , DOB: 01-19-1951 , age 8 y.o. , MRN: [id number] , ADDRESS: [address] [place] [address] PCP Jerl Mina, MD Patient Care Team: Jerl Mina, MD as PCP - General (Family Medicine) Antonieta Iba, MD as PCP - Cardiology (Cardiology) Lovett Sox, MD as Consulting Physician (Cardiothoracic Surgery)  This Provider for this visit: Treatment Team:  Attending Provider: Kalman Shan, MD    01/12/2023 -   Chief Complaint  Patient presents with   Follow-up    Pt denies any concerns     Chronic HP  - hx of asbestos exposure at work + damp environment  -On chronic daily prednisone; reduced to 8 mg/day April 2024.-> this visit he said he is taking 10mg  per day  -August 2023 multidisciplinary case conference: Pleural thickening on the left side but no calcified pleural plaques and 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  - failed ofev -  GI and weight loss - - Intolerant to Ofev 2023  - prior on immuran    Esbriet started February 2024  -Changed to low dose protocol in 2024 by nurse practitioner Tammy Parrott  Chronic Resp failure 4L [place] Obesity  - goal  weight  This Provider for this visit: Treatment Team:  Attending Provider: Kalman Shan, MD  Chronic HP  - failed ofev -  GI and weight loss  - prior on immuran Chronic Resp failure 4L [place] Obesity  - goal weight 160-165#      Intolerant to Ofev 2023 Esbriet started February 2024  07/21/2022 -   Chief Complaint  Patient presents with   Follow-up    F/up      HPI Brian Casey 72 y.o. -returns for follow-up.  He has had further healthy weight loss.  His BMI is now 28.  He did go to [facility] lung transplant  clinic.  He says he was declined.  This because of his medical issues.  Meanwhile he overall feels stable.  His wife is sick with needing cardiac bypass.  She also has upcoming aneurysm surgery.  At the time of cardiac bypass because he was taken care of he stopped his pirfenidone.  He has now restarted it on July 18, 2022 and is going to escalated to full dose.  His pulse ox on room air at rest is 88-91%.  I made him do 5 sets of sit-stand in the office on room air and his pulse ox dropped to 79%.  It recovered after 30 seconds.  He feels at baseline.  He is frustrated little bit because he got declined transplant but he is reconciled to that.  He is proud of his weight loss.  He did have his umbilical hernia surgery by Dr. Corliss Skains and I reviewed the notes.  Lab review shows that in March 2024 his LFTs were slightly high with ALT being slightly high at [facility].  He needs a repeat test today.  We discussed his future course with the hope of him being stable and requiring antifibrotic's.  He is worried about hypoglycemia with his prednisone 10 mg he wants to reduce it.  We took a shared decision making to reduce it to 8 mg/day.  We also discussed future care options including clinical trials.  Discussed the ILD-Pro registry.  He is interested in this but currently because of his wife's issues he is not willing to participate.  We discussed inhaled treprostinil in our clinical trial protocol versus placebo.  He is interested in this but again we will have to wait till latter part of the year to enroll in this.  He is going to meet with the research coordinator to look at the consent form.    OV 09/16/2022  Subjective:  Patient ID: Brian Casey, male , DOB: April 29, 1950 , age 13 y.o. , MRN: [id number] , ADDRESS: [address] [place] [address] PCP Jerl Mina, MD Patient Care Team: Jerl Mina, MD as PCP - General (Family Medicine) Antonieta Iba, MD as PCP - Cardiology  (Cardiology) Lovett Sox, MD as Consulting Physician (Cardiothoracic Surgery)  This Provider for this visit: Treatment Team:  Attending Provider: Kalman Shan, MD    09/16/2022 -   Chief Complaint  Patient presents with   Follow-up    Breathing has been stable. He is tolerating the pirfenidone at 2 tablets TID.          HPI Brian Casey 72 y.o. -returns for follow-up.  He is currently taking pirfenidone full dose.  He is tolerating it well.  He reports overall being stable.  He is lost weight.  He is almost at goal weight right now.  He is very pleased about it.  He is on  Discussed transplant: wife dealing with aneurysm thoracoabdominal and is awaitig surgery in [facility] with a device in [place].  They are busy with all this. Surgery is in Q2 2024 and is taking a lot of her time.  This can prove a challenge. Wife is only 1 caregiver. There are kids but they are working. Living in [place]. But after discussion willing to see them.   - antifibrotic : did not tolerate ofev.  We discussed esbriet. Explained side effects. Explained dosing. Will have to go through the [facility]. He Is willing. BRiefly discussed immuran and cellcept as optoons  - exertional hypoxemia: advised to increase o2 with exertion. In Oct we needed to increase to [facility]. Home concerntrator does not go to [place] [place] He wants order to go through CAT at the [facility]. HE says our [facility] knows the [facility] contact  Right heart cath 02/03/2022- PAP . -> does not meet criteria for tyvaso    06/05/2022 Follow up : ILD, O2 RF  Patient presents for a 6-week follow-up.  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  He has a history of heavy exposure to asbestos with previous work at [employer].  He has previously tried [employer] but was intolerant.  Patient started on Esbriet the beginning of February 2024.  Patient was tolerating well up until he started Esbriet 2 capsules 3 times daily.  Had significant nausea and decreased appetite.  Patient was called in Zofran started on a drug holiday.  Patient says his symptoms immediately stopped when he stopped his Esbriet.  He did not have to use any Zofran.  He had no vomiting or diarrhea.  Patient says he is recently restarted back on Esbriet 2 weeks ago.  He is currently on 1 capsule twice daily and is tolerating well.  We discussed going up very slowly on the Esbriet to see if he can tolerate.  Recent lab work showed a slight increase in his ALT at 63.  Patient is on chronic steroids with prednisone 10  mg.  He does have diabetes and is recently been started on Jardiance. Patient had PFTs done May 07, 2022 that showed stable lung function with FEV1 at 43%, ratio 79, FVC 40%, DLCO 24%.  This is similar to September 2023.  Walk test in the office shows stable oxygen demands -require 6 L of oxygen on continuous flow to maintain O2 saturations while walking.  At rest patient says he does not require any oxygen at all cannot sit still without his oxygen on and keep his sats above 90%.  Today in the office O2 saturations at rest was 90% on room air.  As soon as the patient stands up or starts to walk he does require oxygen because his O2 saturations dropped into the 80s.  Patient denies any hemoptysis, chest pain, orthopnea. Patient has completed pulmonary rehab in the past.  Patient says he is not very active at home.  Is somewhat sedentary.  We discussed increasing his activity as tolerated.  Patient does have upcoming surgery at the end of this month for a umbilical hernia repair.   OV 07/21/2022  Subjective:  Patient ID: Brian Casey, male , DOB: June 08, 1950 , age 39 y.o. , MRN: [id number] , ADDRESS: [address] [place] [address] PCP Jerl Mina, MD Patient Care Team: Jerl Mina, MD as PCP - General (Family Medicine) Antonieta Iba, MD as PCP - Cardiology (Cardiology) Lovett Sox, MD as Consulting Physician (Cardiothoracic Surgery)  and expiratory imaging demonstrates some mild air trapping indicative of mild small airways disease. No definite suspicious appearing pulmonary nodules or masses are noted. No pleural effusions.   Upper Abdomen: Aortic atherosclerosis.  Status post cholecystectomy.   Musculoskeletal: Median sternotomy wires. There are no aggressive appearing lytic or blastic lesions noted in the visualized portions of the skeleton.   IMPRESSION: 1. The appearance of the lungs is indicative of interstitial lung disease, with minimal progression compared to the most recent prior study, but definitive progression when compared to more remote prior examinations. 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 2. Aortic atherosclerosis, in addition to left main and three-vessel coronary artery disease. Status post median sternotomy for CABG including LIMA to the LAD.   Aortic Atherosclerosis  (ICD10-I70.0).     Electronically Signed   By: Trudie Reed M.D.   On: 11/15/2021 06:28    ECHO 11/12/21  IMPRESSIONS     1. Left ventricular ejection fraction, by estimation, is 60 to 65%. The  left ventricle has normal function. The left ventricle has no regional  wall motion abnormalities. There is mild left ventricular hypertrophy.  Left ventricular diastolic parameters  are consistent with Grade I diastolic dysfunction (impaired relaxation).   2. Right ventricular systolic function is normal. The right ventricular  size is normal.   3. The mitral valve is normal in structure. No evidence of mitral valve  regurgitation.   4. The aortic valve is tricuspid. Aortic valve regurgitation is not  visualized. Aortic valve sclerosis is present, with no evidence of aortic  valve stenosis.   5. Aortic dilatation noted. There is mild dilatation of the aortic root,  measuring 39 mm.   6. The inferior vena cava is normal in size with greater than 50%  respiratory variability, suggesting right atrial pressure of 3 mmHg.    Aug 2023 ILD confi with Dr Dorothey Baseman  In 2019 early ILD with reticuation. SIgnifcant progressiont hrough latest CT in May 2023. Lot of upper lobe > Lower lobe back then and now. OVerall pattern: ALTERNATIVE CATEGRORy. DDx is FIBROTIC HP (2021 Exp phase imaging was inadequate , 2020 there was mild air trappng). In May 2023 - more GGO  ? flare. Overall patch peribronchosvascular. NOT c/w UIP. Has pleural thicekning on left but prob due to cardiac urgery. No calcifiied pleural plaques  Consider bix if Risk ok   OV 01/13/2022  Subjective:  Patient ID: Brian Casey, male , DOB: 17-Oct-1950 , age 73 y.o. , MRN: [id number] , ADDRESS: [address] [place] [address] PCP Jerl Mina, MD Patient Care Team: Jerl Mina, MD as PCP - General (Family Medicine) Antonieta Iba, MD as Consulting Physician (Cardiology) Lovett Sox, MD as Consulting  Physician (Cardiothoracic Surgery)  This Provider for this visit: Treatment Team:  Attending Provider: Kalman Shan, MD    01/13/2022 -   Chief Complaint  Patient presents with   Follow-up    Follow-up PT states breathing is not better, DOE   ILD - workup complete. 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 -

## 2023-01-12 NOTE — Addendum Note (Signed)
Addended by: Sarina Ser R on: 01/12/2023 12:00 PM   Modules accepted: Orders

## 2023-01-14 ENCOUNTER — Other Ambulatory Visit: Payer: Self-pay | Admitting: Internal Medicine

## 2023-01-19 NOTE — Telephone Encounter (Signed)
Done. Thanks.

## 2023-01-25 ENCOUNTER — Telehealth: Payer: Self-pay | Admitting: Internal Medicine

## 2023-01-25 NOTE — Telephone Encounter (Signed)
Patient would like a 30 min PFT to be completed in [place] at [facility]. Please call to schedule appointment. Patient has follow up appointment with [facility] for January 14th.

## 2023-01-26 ENCOUNTER — Other Ambulatory Visit: Payer: Self-pay

## 2023-01-26 ENCOUNTER — Telehealth: Payer: Self-pay | Admitting: Internal Medicine

## 2023-01-26 DIAGNOSIS — J849 Interstitial pulmonary disease, unspecified: Secondary | ICD-10-CM

## 2023-01-26 NOTE — Telephone Encounter (Signed)
Patient needs [place] to contact the [facility] [place] [facility]. They need clarification on the amount that needed. He also needs an extension for his VA certification since it expires on January 1st. [place] phone number [phone] ext [phone] [facility])  fax number [phone] -6 min walk -PFT  -ONO Notes

## 2023-01-27 ENCOUNTER — Encounter: Payer: Self-pay | Admitting: Internal Medicine

## 2023-01-27 DIAGNOSIS — J849 Interstitial pulmonary disease, unspecified: Secondary | ICD-10-CM

## 2023-01-27 NOTE — Telephone Encounter (Signed)
Margie had to change the PFT to [facility] so it can be scheduled. Right now I don't have any availability for PFTs left for November waiting for December appts

## 2023-02-02 NOTE — Telephone Encounter (Signed)
Does order need to be placed for 6mw?   Please schedule PFT at [facility] per patient request. Thanks!

## 2023-02-03 ENCOUNTER — Encounter: Payer: Self-pay | Admitting: Internal Medicine

## 2023-02-03 NOTE — Telephone Encounter (Signed)
I have placed another order for PFT and walk to be done at [facility]. It looks like Synetta Fail was waiting on December PFT appts. I will let the patient know.

## 2023-02-03 NOTE — Telephone Encounter (Signed)
Dr. Marchelle Gearing, does the patient need to have an ONO done?

## 2023-02-03 NOTE — Telephone Encounter (Signed)
See patient message from 11/6.  Closing this encounter.

## 2023-02-04 ENCOUNTER — Encounter: Payer: Self-pay | Admitting: Urology

## 2023-02-04 ENCOUNTER — Ambulatory Visit (INDEPENDENT_AMBULATORY_CARE_PROVIDER_SITE_OTHER): Payer: No Typology Code available for payment source | Admitting: Urology

## 2023-02-04 ENCOUNTER — Other Ambulatory Visit: Payer: Self-pay | Admitting: Urology

## 2023-02-04 VITALS — BP 103/62 | HR 90

## 2023-02-04 DIAGNOSIS — N529 Male erectile dysfunction, unspecified: Secondary | ICD-10-CM

## 2023-02-04 DIAGNOSIS — Z125 Encounter for screening for malignant neoplasm of prostate: Secondary | ICD-10-CM

## 2023-02-04 MED ORDER — TADALAFIL 10 MG PO TABS: 10.0000 mg | ORAL_TABLET | Freq: Every day | ORAL | 3 refills | Status: DC | PRN

## 2023-02-04 NOTE — Patient Instructions (Signed)
Prostate Cancer Screening  Prostate cancer screening is testing that is done to check for the presence of prostate cancer in men. The prostate gland is a walnut-sized gland that is located below the bladder and in front of the rectum in males. The function of the prostate is to add fluid to semen during ejaculation. Prostate cancer is one of the most common types of cancer in men. Who should have prostate cancer screening? Screening recommendations vary based on age and other risk factors, as well as between the professional organizations who make the recommendations. In general, screening is recommended if: You are age 50 to 70 and have an average risk for prostate cancer. You should talk with your health care provider about your need for screening and how often screening should be done. Because most prostate cancers are slow growing and will not cause death, screening in this age group is generally reserved for men who have a 10- to 15-year life expectancy. You are younger than age 50, and you have these risk factors: Having a father, brother, or uncle who has been diagnosed with prostate cancer. The risk is higher if your family member's cancer occurred at an early age or if you have multiple family members with prostate cancer at an early age. Being a male who is Black or is of Caribbean or sub-Saharan African descent. In general, screening is not recommended if: You are younger than age 40. You are between the ages of 40 and 49 and you have no risk factors. You are 70 years of age or older. At this age, the risks that screening can cause are greater than the benefits that it may provide. If you are at high risk for prostate cancer, your health care provider may recommend that you have screenings more often or that you start screening at a younger age. How is screening for prostate cancer done? The recommended prostate cancer screening test is a blood test called the prostate-specific antigen  (PSA) test. PSA is a protein that is made in the prostate. As you age, your prostate naturally produces more PSA. Abnormally high PSA levels may be caused by: Prostate cancer. An enlarged prostate that is not caused by cancer (benign prostatic hyperplasia, or BPH). This condition is very common in older men. A prostate gland infection (prostatitis) or urinary tract infection. Certain medicines such as male hormones (like testosterone) or other medicines that raise testosterone levels. A rectal exam may be done as part of prostate cancer screening to help provide information about the size of your prostate gland. When a rectal exam is performed, it should be done after the PSA level is drawn to avoid any effect on the results. Depending on the PSA results, you may need more tests, such as: A physical exam to check the size of your prostate gland, if not done as part of screening. Blood and imaging tests. A procedure to remove tissue samples from your prostate gland for testing (biopsy). This is the only way to know for certain if you have prostate cancer. What are the benefits of prostate cancer screening? Screening can help to identify cancer at an early stage, before symptoms start and when the cancer can be treated more easily. There is a small chance that screening may lower your risk of dying from prostate cancer. The chance is small because prostate cancer is a slow-growing cancer, and most men with prostate cancer die from a different cause. What are the risks of prostate cancer screening? The   main risk of prostate cancer screening is diagnosing and treating prostate cancer that would never have caused any symptoms or problems. This is called overdiagnosisand overtreatment. PSA screening cannot tell you if your PSA is high due to cancer or a different cause. A prostate biopsy is the only procedure to diagnose prostate cancer. Even the results of a biopsy may not tell you if your cancer needs to  be treated. Slow-growing prostate cancer may not need any treatment other than monitoring, so diagnosing and treating it may cause unnecessary stress or other side effects. Questions to ask your health care provider When should I start prostate cancer screening? What is my risk for prostate cancer? How often do I need screening? What type of screening tests do I need? How do I get my test results? What do my results mean? Do I need treatment? Where to find more information The American Cancer Society: www.cancer.org American Urological Association: www.auanet.org Contact a health care provider if: You have difficulty urinating. You have pain when you urinate or ejaculate. You have blood in your urine or semen. You have pain in your back or in the area of your prostate. Summary Prostate cancer is a common type of cancer in men. The prostate gland is located below the bladder and in front of the rectum. This gland adds fluid to semen during ejaculation. Prostate cancer screening may identify cancer at an early stage, when the cancer can be treated more easily and is less likely to have spread to other areas of the body. The prostate-specific antigen (PSA) test is the recommended screening test for prostate cancer, but it has associated risks. Discuss the risks and benefits of prostate cancer screening with your health care provider. If you are age 70 or older, the risks that screening can cause are greater than the benefits that it may provide. This information is not intended to replace advice given to you by your health care provider. Make sure you discuss any questions you have with your health care provider. Document Revised: 09/02/2020 Document Reviewed: 09/02/2020 Elsevier Patient Education  2024 Elsevier Inc.  

## 2023-02-04 NOTE — Progress Notes (Signed)
02/04/23 4:37 PM   Brian Casey 24-Jul-1950 [id number]  CC: ED, PSA screening  HPI: 72 year old male with a number of comorbidities including ILD on oxygen who is referred to establish care for ED and PSA screening.  He was previously followed by Dr. Sheppard Penton, those records are not available to me.  He was using 10 mg Cialis for ED with good results.  He has not been sexually active over the last year as his wife has had some health issues.  He denies any urinary symptoms.  He also had questions about PSA screening.  Most recent PSA from May 2024 was normal at 0.37, urinalysis at that time was also benign aside from glucosuria.   PMH: Past Medical History:  Diagnosis Date   Adenomatous colon polyp    Aortic root dilatation (HCC)    Noted on echo in August 2023.  Measured 39 mm.   Arthritis    "lower spine; fingers" (11/07/2015)   CAD (coronary artery disease)    a. 10/2015 NSTEMI/Cath: LM 40, LAD 70p, LCX 90ost, RI 90ost, RCA 60-70p, 90-60m/d, 40-50d; b. 10/2015 CABG x 4: LIMA->LAD, VG->Diag, VG->RI, VG->RPDA.   Chronic respiratory failure with hypoxia (HCC)    CKD (chronic kidney disease), stage III (HCC)    Diastolic dysfunction    a. 10/2021 Echo: EF 60-65%,   Diverticulosis    GERD (gastroesophageal reflux disease)    Grade I diastolic dysfunction    High cholesterol    History of bronchitis    Hypertension 10/17/2013   Hypothyroidism    ILD (interstitial lung disease) (HCC)    Mild pulmonary hypertension (HCC)    PAD (peripheral artery disease) (HCC)    s/p left carotid endarterectomy   Post-op atrial fibrillation (HCC) 11/06/2015   Shortness of breath    Type II diabetes mellitus (HCC)    type II   Wears glasses     Surgical History: Past Surgical History:  Procedure Laterality Date   CARDIAC CATHETERIZATION Right 11/06/2015   Procedure: Left Heart Cath and Coronary Angiography;  Surgeon: Antonieta Iba, MD;  Location: [facility] INVASIVE CV LAB;  Service:  Cardiovascular;  Laterality: Right;   CATARACT EXTRACTION W/PHACO Right 09/09/2022   Procedure: CATARACT EXTRACTION PHACO AND INTRAOCULAR LENS PLACEMENT (IOC) RIGHT DIABETIC OMIDRIA 11.17 01:06.5;  Surgeon: Estanislado Pandy, MD;  Location: [facility];  Service: Ophthalmology;  Laterality: Right;   CATARACT EXTRACTION W/PHACO Left 10/08/2022   Procedure: CATARACT EXTRACTION PHACO AND INTRAOCULAR LENS PLACEMENT (IOC) LEFT DIABETIC OMIDRIA 12.90 01:11.8;  Surgeon: Estanislado Pandy, MD;  Location: [facility];  Service: Ophthalmology;  Laterality: Left;   CHOLECYSTECTOMY N/A 01/17/2016   Procedure: LAPAROSCOPIC CHOLECYSTECTOMY WITH INTRAOPERATIVE CHOLANGIOGRAM;  Surgeon: Claud Kelp, MD;  Location: [facility] OR;  Service: General;  Laterality: N/A;   COLONOSCOPY     COLONOSCOPY N/A 11/19/2021   Procedure: COLONOSCOPY;  Surgeon: Toledo, Boykin Nearing, MD;  Location: [facility] ENDOSCOPY;  Service: Gastroenterology;  Laterality: N/A;   CORONARY ARTERY BYPASS GRAFT N/A 11/14/2015   Procedure: CORONARY ARTERY BYPASS GRAFTING (CABG) x 4;  Surgeon: Kerin Perna, MD;  Location: [facility] OR;  Service: Open Heart Surgery;  Laterality: N/A;   ENDARTERECTOMY Left 11/14/2015   Procedure: LEFT ENDARTERECTOMY CAROTID;  Surgeon: Maeola Harman, MD;  Location: [facility] OR;  Service: Vascular;  Laterality: Left;   ESOPHAGOGASTRODUODENOSCOPY N/A 11/19/2021   Procedure: ESOPHAGOGASTRODUODENOSCOPY (EGD);  Surgeon: Toledo, Boykin Nearing, MD;  Location: [facility] ENDOSCOPY;  Service: Gastroenterology;  Laterality: N/A;  EXCISION OF MESH N/A 06/18/2022   Procedure: EXCISION OF MESH;  Surgeon: Manus Rudd, MD;  Location: [facility] OR;  Service: General;  Laterality: N/A;   FINGER GANGLION CYST EXCISION Left X 3   "index finger X 2; thumb X 1"   HEMORRHOID SURGERY  1982   HERNIA REPAIR     INSERTION OF MESH N/A 11/16/2013   Procedure: INSERTION OF MESH;  Surgeon: Wilmon Arms. Corliss Skains, MD;  Location: [facility] OR;  Service: General;   Laterality: N/A;   IR GENERIC HISTORICAL  11/08/2015   IR PERC CHOLECYSTOSTOMY 11/08/2015 Gilmer Mor, DO [facility]   IR GENERIC HISTORICAL  12/13/2015   IR CHOLANGIOGRAM EXISTING TUBE 12/13/2015 [facility]INTER[facility]   IR GENERIC HISTORICAL  12/25/2015   IR CATHETER TUBE CHANGE 12/25/2015 Simonne Come, MD [facility]   IR GENERIC HISTORICAL  12/12/2015   IR RADIOLOGIST EVAL & MGMT 12/12/2015 Berdine Dance, MD [facility]   RIGHT HEART CATH Right 02/03/2022   Procedure: RIGHT HEART CATH;  Surgeon: Yvonne Kendall, MD;  Location: [facility] INVASIVE CV LAB;  Service: Cardiovascular;  Laterality: Right;   TEE WITHOUT CARDIOVERSION N/A 11/14/2015   Procedure: TRANSESOPHAGEAL ECHOCARDIOGRAM (TEE);  Surgeon: Kerin Perna, MD;  Location: [facility] OR;  Service: Open Heart Surgery;  Laterality: N/A;   UMBILICAL HERNIA REPAIR N/A 11/16/2013   Procedure: UMBILICAL HERNIA REPAIR;  Surgeon: Wilmon Arms. Corliss Skains, MD;  Location: [facility] OR;  Service: General;  Laterality: N/A;   UMBILICAL HERNIA REPAIR N/A 06/18/2022   Procedure: OPEN UMBILICAL HERNIA REPAIR;  Surgeon: Manus Rudd, MD;  Location: [facility] OR;  Service: General;  Laterality: N/A;   VEIN HARVEST Right 11/14/2015   Procedure: RIGHT LEG GREATER SAPHENOUS VEIN HARVEST;  Surgeon: Kerin Perna, MD;  Location: [facility] OR;  Service: Open Heart Surgery;  Laterality: Right;     Family History: Family History  Problem Relation Age of Onset   Hypertension Mother    Diabetes Mother    Depression Mother    Colon cancer Mother    Hyperthyroidism Father    Hypertension Father    Prostate cancer Father    Diabetes Maternal Grandmother    Lung cancer Maternal Grandmother        non smoker    Social History:  reports that he quit smoking about 7 years ago. His smoking use included cigarettes. He started smoking about 55 years ago. He has a 48 pack-year smoking history. He has never used smokeless tobacco. He reports current alcohol use of about 3.0 standard drinks of alcohol  per week. He reports that he does not use drugs.  Physical Exam: BP 103/62   Pulse 90    Constitutional:  Alert and oriented, No acute distress. Cardiovascular: No clubbing, cyanosis, or edema. Respiratory: Normal respiratory effort, no increased work of breathing. GI: Abdomen is soft, nontender, nondistended, no abdominal masses  Assessment & Plan:   72 year old male previously followed by Dr. Sheppard Penton for ED and PSA screening.  Reassurance provided regarding normal PSA of 0.37, we reviewed the AUA guidelines that do not recommend routine screening in men over age 47, and I think he can safely discontinue PSA screening.  In terms of his ED, Cialis was refilled and we discussed that 20 mg/day would be the maximum dose.  T  Cialis refilled, can be filled by PCP moving forward No further PSA screening per guideline recommendations Follow-up with urology as needed  Legrand Rams, MD 02/04/2023  [facility] [address] [place], [place] [address] [phone]

## 2023-02-05 DIAGNOSIS — N529 Male erectile dysfunction, unspecified: Secondary | ICD-10-CM

## 2023-02-05 MED ORDER — TADALAFIL 10 MG PO TABS
10.0000 mg | ORAL_TABLET | Freq: Every day | ORAL | 3 refills | Status: AC | PRN
Start: 2023-02-05 — End: ?

## 2023-02-08 NOTE — Telephone Encounter (Signed)
Because he already has oxygen at home at night and he did desaturate a little bit with walking he does not need another  ONO test

## 2023-02-19 ENCOUNTER — Encounter: Payer: Self-pay | Admitting: Internal Medicine

## 2023-02-22 ENCOUNTER — Other Ambulatory Visit: Payer: Self-pay | Admitting: *Deleted

## 2023-02-23 ENCOUNTER — Other Ambulatory Visit: Payer: Self-pay

## 2023-02-23 DIAGNOSIS — J849 Interstitial pulmonary disease, unspecified: Secondary | ICD-10-CM

## 2023-02-23 NOTE — Telephone Encounter (Signed)
Patient has been scheduled for PFT and walk.   Nothing further needed.

## 2023-02-23 NOTE — Telephone Encounter (Signed)
I have spoke with Brian Casey and his PFT and 6 minute walk test has been scheduled on 03/09/2023 PFT at 10:00am and walk test at 11:30am [facility] Entrance

## 2023-02-23 NOTE — Telephone Encounter (Signed)
PFT and 6 min walk have been scheduled. The patient is aware.  Nothing further needed.

## 2023-03-04 ENCOUNTER — Ambulatory Visit: Payer: No Typology Code available for payment source | Admitting: Internal Medicine

## 2023-03-09 ENCOUNTER — Ambulatory Visit: Payer: No Typology Code available for payment source

## 2023-03-09 ENCOUNTER — Telehealth: Payer: Self-pay

## 2023-03-09 ENCOUNTER — Ambulatory Visit: Payer: No Typology Code available for payment source | Attending: Internal Medicine

## 2023-03-09 DIAGNOSIS — J849 Interstitial pulmonary disease, unspecified: Secondary | ICD-10-CM

## 2023-03-09 DIAGNOSIS — J449 Chronic obstructive pulmonary disease, unspecified: Secondary | ICD-10-CM | POA: Diagnosis not present

## 2023-03-09 DIAGNOSIS — R0609 Other forms of dyspnea: Secondary | ICD-10-CM

## 2023-03-09 LAB — PULMONARY FUNCTION TEST ARMC ONLY
DL/VA % pred: 69 %
DL/VA: 2.83 ml/min/mmHg/L
DLCO unc % pred: 28 %
DLCO unc: 6.75 ml/min/mmHg
FEF 25-75 Pre: 1.14 L/s
FEF2575-%Pred-Pre: 51 %
FEV1-%Pred-Pre: 42 %
FEV1-Pre: 1.23 L
FEV1FVC-%Pred-Pre: 114 %
FEV6-%Pred-Pre: 39 %
FEV6-Pre: 1.47 L
FEV6FVC-%Pred-Pre: 106 %
FVC-%Pred-Pre: 37 %
FVC-Pre: 1.47 L
Pre FEV1/FVC ratio: 84 %
Pre FEV6/FVC Ratio: 100 %

## 2023-03-09 NOTE — Telephone Encounter (Signed)
Pt in office for SMW- spo2 87% on roomair. 4L started and test was performed. Spo2 maintained at 90% Pt stated that test was requested by [facility] to be re certified. MR please advise if okay to order. Thanks

## 2023-03-09 NOTE — Progress Notes (Signed)
Pt in office for SMW. ? ?

## 2023-03-12 NOTE — Telephone Encounter (Signed)
MR, please advise. Thanks!  

## 2023-03-18 NOTE — Telephone Encounter (Signed)
MR, please advise if okay to order?

## 2023-03-19 NOTE — Telephone Encounter (Signed)
Yes ok to order  

## 2023-03-19 NOTE — Telephone Encounter (Signed)
Order placed. Nothing further needed. 

## 2023-03-23 ENCOUNTER — Telehealth: Payer: Self-pay | Admitting: Internal Medicine

## 2023-03-23 NOTE — Telephone Encounter (Signed)
 PT calling for walk test and PFT results to be sent to the [place] in [place]. I can not see results. States Not Released  Please fax in @ [phone] Attn: Cat  Walk Test done by Margie PFT done by Mabel Radish  Please call PT once sent over @ [phone]

## 2023-03-30 ENCOUNTER — Telehealth: Payer: Self-pay | Admitting: Cardiovascular Disease

## 2023-03-30 ENCOUNTER — Ambulatory Visit
Admission: RE | Admit: 2023-03-30 | Discharge: 2023-03-30 | Disposition: A | Discharge status: Home / Self Care | Payer: No Typology Code available for payment source | Source: Ambulatory Visit | Attending: Internal Medicine | Admitting: Internal Medicine

## 2023-03-30 DIAGNOSIS — J849 Interstitial pulmonary disease, unspecified: Secondary | ICD-10-CM

## 2023-03-30 NOTE — Telephone Encounter (Signed)
 Calling to see if he should have another scan done from the one he had done on March 12,2024. Please advise

## 2023-03-30 NOTE — Telephone Encounter (Signed)
 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 Less than 39% disease on left Appears unchanged compared to prior study April 2018. So that having another one could be discussed at follow-up appointment Tuesday 05/25/23. Patient understood with read back. Patient left number for consults within the [place] as [phone]

## 2023-03-30 NOTE — Telephone Encounter (Signed)
 Wanted to provide the consult number from the [place] #[id number]

## 2023-03-30 NOTE — Telephone Encounter (Signed)
Duplicate note- See previous encounter.

## 2023-03-31 ENCOUNTER — Telehealth: Payer: Self-pay | Admitting: Internal Medicine

## 2023-03-31 DIAGNOSIS — J849 Interstitial pulmonary disease, unspecified: Secondary | ICD-10-CM

## 2023-03-31 NOTE — Telephone Encounter (Signed)
**  See last encounter  Information was faxed to patient but should be faxed to CAT at [phone].

## 2023-03-31 NOTE — Telephone Encounter (Signed)
 Received a referral in [place] office from his PCP requesting pulm rehab.I wasn't sure if this was something Ramaswamy wanted to order. If so needs to refer to rehab in [facility]

## 2023-03-31 NOTE — Telephone Encounter (Signed)
 I faxed PFT and walk test to Cat at the number provided by the pt  I called and spoke with pt and notified him that this was done  Nothing further needed

## 2023-03-31 NOTE — Telephone Encounter (Signed)
 Fax was re sent to correct fax for Cat

## 2023-04-01 NOTE — Telephone Encounter (Signed)
 Done. Did order.

## 2023-04-06 ENCOUNTER — Telehealth: Payer: Self-pay | Admitting: Internal Medicine

## 2023-04-06 ENCOUNTER — Telehealth: Payer: No Typology Code available for payment source | Admitting: Internal Medicine

## 2023-04-06 DIAGNOSIS — Z79899 Other long term (current) drug therapy: Secondary | ICD-10-CM

## 2023-04-06 DIAGNOSIS — J9611 Chronic respiratory failure with hypoxia: Secondary | ICD-10-CM

## 2023-04-06 DIAGNOSIS — Z7952 Long term (current) use of systemic steroids: Secondary | ICD-10-CM

## 2023-04-06 DIAGNOSIS — J679 Hypersensitivity pneumonitis due to unspecified organic dust: Secondary | ICD-10-CM

## 2023-04-06 DIAGNOSIS — Z87891 Personal history of nicotine dependence: Secondary | ICD-10-CM

## 2023-04-06 DIAGNOSIS — J849 Interstitial pulmonary disease, unspecified: Secondary | ICD-10-CM

## 2023-04-06 DIAGNOSIS — R0609 Other forms of dyspnea: Secondary | ICD-10-CM

## 2023-04-06 DIAGNOSIS — Z5181 Encounter for therapeutic drug level monitoring: Secondary | ICD-10-CM

## 2023-04-06 DIAGNOSIS — Z122 Encounter for screening for malignant neoplasm of respiratory organs: Secondary | ICD-10-CM

## 2023-04-06 NOTE — Progress Notes (Signed)
 OV 11/04/2021  Subjective:  Patient ID: Brian Casey, male , DOB: Sep 17, 1950 , age 73 y.o. , MRN: [id number] , ADDRESS: [address] [place] [address] PCP Valora Agent, MD Patient Care Team: Valora Agent, MD as PCP - General (Family Medicine) Perla Evalene PARAS, MD as Consulting Physician (Cardiology) Obadiah Coy, MD as Consulting Physician (Cardiothoracic Surgery)  This Provider for this visit: Treatment Team:  Attending Provider: Geronimo Amel, MD    11/04/2021 -  hx chart reviwee,patient, wife, ILD questions Chief Complaint  Patient presents with   Consult    Pt has had recent imaging performed which showed diagnosis of ILD. Pt has had complaints of cough and SOB.     HPI Brian Casey 73 y.o. -  Presents with wife. Has hx of heavy asbestos ezposure. Former smoker. In Aug 2017 had acute chole, e colii bcacteremia and NSTEMI and sp CABG. CTA at that time showed some GGO and bialteral efffusion. Then in 2019 June admitted for had pleuirsy and s/p thora 1L (rad review confirms that). LHD 579. CUtlure negatiuve. WAS EOSINOPHLIC 40%. C at that the time considered as ILD by chest radiology. Also had AKI creat 1.76mg %. Then started following with Dr Theotis in [place]. Says he was on surveillance approach. No thora ever after that. In April 2022 he had CT - the ILD for first time started getting worse but he was feeling fine. In June 2022 started seeing Dr Parris. ARound this time also had Covid. In Nov 2022 had another CT cchest  ILD progressed but stil feeling ok. In Jan 2023 developed cough, ACE inhibitor got changed to ARB and sometime later got started in pred/immuran as well for his ILD. Then developed flu like symptoms. Both ARB and immuran g t stopped and he got better. Immuran restarted at lower dose at 50mg  per day in May 2023. CT at this time showed worsening ILD. 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 Prednisone  at 20mg  per day. No more flu symptoms.  Feels side effect was ARB related. Despte immune suppression regimen he feels he is declining more dysneic. Still on room air at home and started rehab few days ago. Noticed in rehab needing 4L [place] to exert  (also confirmed here this visit). In addition prednisone  is causing insomina and fatigue. He feels this med regimen is not helping  Of note he has upcoing endoscopy and colonoscop with Dr Aundria 11/19/21. Also has a verntral hernia for which he wants surgery. Overall he and his wife are very concerned about this ILD   He is also on Inhaler Rx and not sure it is Physiological Scientist Integrated Comprehensive ILD Questionnaire  Symptoms:  gradally getting worse x 6 months. Cough x 6 months only but CT actually getting worse Apr 2022 -> May 2023. 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 Worked for [employer] in [place]  PULMONARY TOXICITY HISTORY (27 items):  Prednisone  and Immuran since May 2023  INVESTIGATIONS: 2017 CT abdomen lung cuts  He had left-sided pleural effusion and reticular abnormalities suggestive of ILD [back then it was  called as bibasilar atelectasis]  2017 August CT scan of the chest: Bilateral pleural effusions some upper lobe groundglass opacities.  No clear-cut evidence of ILD:  2019 June CT scan of the chest: Read by Dr. Leonce.  Concerning for ILD.  Reticulation also left-sided pleural effusion  May 2021 high-resolution CT chest: Read by Dr. Leonce.  Indeterminate pattern for UIP versus alternate diagnosis.SABRA  Definite of ILD.  There is subpleural sparing.  Left-sided pleural effusion present.  Findings deemed is unchanged from prior.  I personally agree with the findings  April 2022 CT chest without contrast [not a high-resolution CT chest].  Personally thought ILD is worse.  Radiology seems to agree  November 2022 in May 2023 CT scan of the chest [again not high-resolution] definite worsening of ILD changes.  Increased groundglass component.  May 2023: CT - progressive ILD. Worsning GGO and reticulation. Trace left effuion   UGI score 11/19/21:: Diffuse mild inflammation characterized by congestion (edema) and erosions was found in the entire examined stomach.   OV 11/20/2021  Subjective:  Patient ID: Brian Casey, male , DOB: 1950-09-03 , age 33 y.o. , MRN: [id number] , ADDRESS: [address] [place] [address] PCP Valora Agent, MD Patient Care Team: Valora Agent, MD as PCP - General (Family Medicine) Perla Evalene PARAS, MD as Consulting Physician (Cardiology) Obadiah Coy, MD as Consulting Physician (Cardiothoracic Surgery)  This Provider for this visit: Treatment Team:  Attending Provider: Geronimo Amel, MD   11/20/2021 -returns for video visit.  ILD work-up in progress.   HPI Brian Casey 73 y.o. -  #ILD work-up: After his last visit I presented him at the thoracic multidisciplinary conference.  Dr. Bonnetta felt the CT scan was more consistent with alternative pattern possibly fibrotic NSIP or even HP given the upper lobe predominance and air trapping.  He  subsequently had a high-resolution CT scan of the chest that was read by Dr. Toribio Aye who feels CT scan is more consistent with chronic HP.  We went over the exposures again there is no current exposure.  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  While he remembers asbestos exposure he does not remember mold exposure but it was definitely water and there was a lot of dampness.  There is no evidence of asbestos plaques or UIP pattern on the CT scan of the chest.  Nevertheless it is progressive.  We discussed the differential diagnosis currently of chronic HP versus NSIP versus asbestosis versus IPF.  It appears that most likely this is chronic HP.  Based on this and the progression my recommendation to him was that we would start antifibrotic's.  Did indicate to him that there is a role for prednisone  and immunomodulators in the setting of chronic NSIP or HP but given the side effect profile with prednisone  [fatigue and insomnia] and also future potential risk  for immunosuppression with Imuran I advised that antifibrotic's nintedanib would be first-line.  He is better now with 10 mg prednisone .  He is sleeping better.  Therefore also recommended continuing with prednisone .  Recommended holding off any restarting of the Imuran or consideration of CellCept as a third line option.  We will also going to hold off on surgical lung biopsy.  Indicated to him that in the long run he would need to lose weight get fit and consider transplant as an option.  For these purposes I would like to avoid immunosuppression and also surgical lung biopsy.  In addition with 4 L exertion desaturation did not want to consider surgical lung biopsy at this point in time.  He and his wife are agreeable with the plan  #Therapeutic side effects: After stopping prednisone  his insomnia and fatigue are better.  We discussed  nintedanib.  He does have diverticulosis on the recent CT scan but has never had diverticulitis.  He does not have a history of GI bleeding.  No history of MI.  Recent echo was normal.  We discussed the diarrhea and liver toxicity monitoring of the rare side effects of nintedanib.  He is agreeable to start this.  #Hypoxemia: He told me that he is desaturating at home with 4 L but does well in rehab because of his machine.  I recommended oxygen titration test but my CMA indicated to me that he did fine with 4 L exertion in our office at last visit.  She will him to contact him and verify if he is still desaturating at home.  If so then we will have to do a detailed oxygen titration test.  He is still awaiting his [place] through the [facility].  He does not want to do it through the [employer] and it could take a while.  #Umbilical hernia repair.  This is being held off at this point.       HRCT 11/13/21  Narrative & Impression  CLINICAL DATA:  73 year old male with history of cough and shortness of breath worsening since May 2023. Evaluate for interstitial lung disease.   EXAM: CT CHEST WITHOUT CONTRAST   TECHNIQUE: Multidetector CT imaging of the chest was performed following the standard protocol without intravenous contrast. High resolution imaging of the lungs, as well as inspiratory and expiratory imaging, was performed.   RADIATION DOSE REDUCTION: This exam was performed according to the departmental dose-optimization program which includes automated exposure control, adjustment of the mA and/or kV according to patient size and/or use of iterative reconstruction technique.   COMPARISON:  Chest CT 08/20/2021.   FINDINGS: Cardiovascular: Heart size is normal. There is no significant pericardial fluid, thickening or pericardial calcification. There is aortic atherosclerosis, as well as atherosclerosis of the great vessels of the mediastinum and the coronary arteries,  including calcified atherosclerotic plaque in the left main, left anterior descending, left circumflex and right coronary arteries. Status post median sternotomy for CABG including LIMA to the LAD.   Mediastinum/Nodes: No pathologically enlarged mediastinal or hilar lymph nodes. Please note that accurate exclusion of hilar adenopathy is limited on noncontrast CT scans. Esophagus is unremarkable in appearance. No axillary lymphadenopathy.   Lungs/Pleura: Widespread but patchy areas of ground-glass attenuation, septal thickening, subpleural reticulation, thickening of the peribronchovascular interstitium, cylindrical bronchiectasis and peripheral bronchiolectasis are noted. Some areas of apparent developing honeycombing are noted, most evident near the apex of the right upper lobe. Overall, findings appear relatively similar to the recent prior chest CT. Inspiratory  and expiratory imaging demonstrates some mild air trapping indicative of mild small airways disease. No definite suspicious appearing pulmonary nodules or masses are noted. No pleural effusions.   Upper Abdomen: Aortic atherosclerosis.  Status post cholecystectomy.   Musculoskeletal: Median sternotomy wires. There are no aggressive appearing lytic or blastic lesions noted in the visualized portions of the skeleton.   IMPRESSION: 1. The appearance of the lungs is indicative of interstitial lung disease, with minimal progression compared to the most recent prior study, but definitive progression when compared to more remote prior examinations. 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 2. Aortic atherosclerosis, in addition to left main and three-vessel coronary artery disease. Status post median sternotomy for CABG including LIMA to the LAD.   Aortic Atherosclerosis  (ICD10-I70.0).     Electronically Signed   By: Toribio Aye M.D.   On: 11/15/2021 06:28    ECHO 11/12/21  IMPRESSIONS     1. Left ventricular ejection fraction, by estimation, is 60 to 65%. The  left ventricle has normal function. The left ventricle has no regional  wall motion abnormalities. There is mild left ventricular hypertrophy.  Left ventricular diastolic parameters  are consistent with Grade I diastolic dysfunction (impaired relaxation).   2. Right ventricular systolic function is normal. The right ventricular  size is normal.   3. The mitral valve is normal in structure. No evidence of mitral valve  regurgitation.   4. The aortic valve is tricuspid. Aortic valve regurgitation is not  visualized. Aortic valve sclerosis is present, with no evidence of aortic  valve stenosis.   5. Aortic dilatation noted. There is mild dilatation of the aortic root,  measuring 39 mm.   6. The inferior vena cava is normal in size with greater than 50%  respiratory variability, suggesting right atrial pressure of 3 mmHg.    Aug 2023 ILD confi with Dr Bonnetta  In 2019 early ILD with reticuation. SIgnifcant progressiont hrough latest CT in May 2023. Lot of upper lobe > Lower lobe back then and now. OVerall pattern: ALTERNATIVE CATEGRORy. DDx is FIBROTIC HP (2021 Exp phase imaging was inadequate , 2020 there was mild air trappng). In May 2023 - more GGO  ? flare. Overall patch peribronchosvascular. NOT c/w UIP. Has pleural thicekning on left but prob due to cardiac urgery. No calcifiied pleural plaques  Consider bix if Risk ok   OV 01/13/2022  Subjective:  Patient ID: Brian Casey, male , DOB: 1950/06/08 , age 41 y.o. , MRN: [id number] , ADDRESS: [address] [place] [address] PCP Valora Agent, MD Patient Care Team: Valora Agent, MD as PCP - General (Family Medicine) Perla Evalene PARAS, MD as Consulting Physician (Cardiology) Obadiah Coy, MD as Consulting  Physician (Cardiothoracic Surgery)  This Provider for this visit: Treatment Team:  Attending Provider: Geronimo Amel, MD    01/13/2022 -   Chief Complaint  Patient presents with   Follow-up    Follow-up PT states breathing is not better, DOE   ILD - workup complete. 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 -  prsents with wife for followup. He and wife are quire worried about his health and life expectancy (few to several years) and quality of life He has sveral concrns  Easy destruation- sometimes to 60% and 75%. Always with eerrtion and relieved by rest but there is variability. He is unsure of quality of pulse ox probe but appears same thing inr ehab based on test chart review. STill here able to walk ADL distances ith 6L and without desats Echo shows Gr 1 DDx - has seen cards Saw Dr Gollan - has not had RHC Feels rehab did not help him though distance improved fom 400+ feet to 700 + feet ILD Symptoms: feels getting worse. Needing more o2 Not yet started night o2: ONO results are finally in but with PCP Valora Agent, MD who is at [facility] clinic Rx  On ofev  but having severe diarrhea. Takes immodium in reactionary fashion every other day. Drinks coffee daily.  Drinks no sugar added apple juice Rx - continue prednisone  10mg  per day Pointd out he worked in [employer] [place] for [employer] Prognosis - explained could be few to several years but with small % declining rapidly in < 1 year.  In his case PFTs show sever restriction and dLCO Also unintenitional weight loss due to ofev  - but he sees this as positive due to obsity Has new low K 3 meq 12/31/21 and was wondering if due to prednisone   (unlikely) or diarrhea (possibly yes) Abd hernia - surgery not in the works Obesity - BMI is > 30 and is a barrier to health  =     OV 03/12/2022  Subjective:  Patient ID: Brian Casey, male , DOB: April 07, 1950 , age 35 y.o. , MRN: [id number] , ADDRESS: [address] [place] [address] PCP Valora Agent, MD Patient Care Team: Valora Agent, MD as PCP - General (Family Medicine) Perla Evalene PARAS, MD as PCP - Cardiology (Cardiology) Obadiah Coy, MD as Consulting Physician (Cardiothoracic Surgery)   Type of visit: Video Virtual Visit Identification of patient OSEPH IMBURGIA with 03-26-1950 and MRN [id number] - 2 person identifier Risks: Risks, benefits, limitations of telephone visit explained. Patient understood and verbalized agreement to proceed Anyone else on call: wife Patient location: home This provider location: [address]; [place]; [place] [address]. [facility]. [phone]    This Provider for this visit: Treatment Team:  Attending Provider: Geronimo Amel, MD  Chronic HP  - failed ofev  -  GI and weight loss  - prior on immuran Chronic Resp failure 4L [place] Obesity  - goal weight 170-175#  03/12/2022 -  No chief complaint on file.    HPI Brian Casey 72 y.o. -stopped ofev  02/16/22. Recent increase in cough. PCP office was positive for flu 03/05/22. Since then cough is worse. Rx with Tamiflu but has not helped. Using hydrocodone  more now but still has cough at night. Slowly improving though.  On 02/23/22 his bili was 1.4 (After stopping ofev ) but rechck 03/03/22 bilis is 1.2. Currently 4L [place] day and at night 2L [place] (has gotten the [facility] Night o2). Wife says even with exertion at 4L [place] drops to 77% . Unable to do Anything without desats. Currently remains on 10mg  prednisone . HE has challenge going up on prednisone  diue to insominia. But now PCP has him on xanax  and is helping sleep . So he feels he can challenge himself  with higher dose prednisone    ILD Rx:   -  Discussed transplant: wife dealing with aneurysm thoracoabdominal and is awaitig surgery in [facility] with a device in [place].  They are busy with all this. Surgery is in Q2 2024 and is taking a lot of her time.  This can prove a challenge. Wife is only 1 caregiver. There are kids but they are working. Living in [place]. But after discussion willing to see them.   - antifibrotic : did not tolerate ofev .  We discussed esbriet . Explained side effects. Explained dosing. Will have to go through the [facility]. He Is willing. BRiefly discussed immuran and cellcept as optoons  - exertional hypoxemia: advised to increase o2 with exertion. In Oct we needed to increase to [facility]. Home concerntrator does not go to 6l [place] He wants order to go through CAT at the [facility]. HE says our [facility] knows the [facility] contact  Right heart cath 02/03/2022- PAP . -> does not meet criteria for tyvaso    06/05/2022 Follow up : ILD, O2 RF  Patient presents for a 6-week follow-up.  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  He has a history of heavy exposure to asbestos with previous work at [employer].  He has previously tried Ofev  but was intolerant.  Patient started on Esbriet  the beginning of February 2024.  Patient was tolerating well up until he started Esbriet  2 capsules 3 times daily.  Had significant nausea and decreased appetite.  Patient was called in Zofran  started on a drug holiday.  Patient says his symptoms immediately stopped when he stopped his Esbriet .  He did not have to use any Zofran .  He had no vomiting or diarrhea.  Patient says he is recently restarted back on Esbriet  2 weeks ago.  He is currently on 1 capsule twice daily and is tolerating well.  We discussed going up very slowly on the Esbriet  to see if he can tolerate.  Recent lab work showed a slight increase in his ALT at 63.  Patient is on chronic steroids with prednisone  10  mg.  He does have diabetes and is recently been started on Jardiance. Patient had PFTs done May 07, 2022 that showed stable lung function with FEV1 at 43%, ratio 79, FVC 40%, DLCO 24%.  This is similar to September 2023.  Walk test in the office shows stable oxygen demands -require 6 L of oxygen on continuous flow to maintain O2 saturations while walking.  At rest patient says he does not require any oxygen at all cannot sit still without his oxygen on and keep his sats above 90%.  Today in the office O2 saturations at rest was 90% on room air.  As soon as the patient stands up or starts to walk he does require oxygen because his O2 saturations dropped into the 80s.  Patient denies any hemoptysis, chest pain, orthopnea. Patient has completed pulmonary rehab in the past.  Patient says he is not very active at home.  Is somewhat sedentary.  We discussed increasing his activity as tolerated.  Patient does have upcoming surgery at the end of this month for a umbilical hernia repair.   OV 07/21/2022  Subjective:  Patient ID: Brian Casey, male , DOB: 1950-10-11 , age 7 y.o. , MRN: [id number] , ADDRESS: [address] [place] [address] PCP Valora Agent, MD Patient Care Team: Valora Agent, MD as PCP - General (Family Medicine) Perla Evalene PARAS, MD as PCP - Cardiology (Cardiology) Obadiah Coy, MD as Consulting Physician (Cardiothoracic Surgery)  This Provider for this visit: Treatment Team:  Attending Provider: Geronimo Amel, MD  Chronic HP  - failed ofev  -  GI and weight loss  - prior on immuran Chronic Resp failure 4L [place] Obesity  - goal weight 160-165#      Intolerant to Ofev  2023 Esbriet  started February 2024  07/21/2022 -   Chief Complaint  Patient presents with   Follow-up    F/up      HPI Brian Casey 73 y.o. -returns for follow-up.  He has had further healthy weight loss.  His BMI is now 28.  He did go to [facility]  cl[facility].  He says he was declined.  This because of his medical issues.  Meanwhile he overall feels stable.  His wife is sick with needing cardiac bypass.  She also has upcoming aneurysm surgery.  At the time of cardiac bypass because he was taken care of he stopped his pirfenidone .  He has now restarted it on July 18, 2022 and is going to escalated to full dose.  His pulse ox on room air at rest is 88-91%.  I made him do 5 sets of sit-stand in the office on room air and his pulse ox dropped to 79%.  It recovered after 30 seconds.  He feels at baseline.  He is frustrated little bit because he got declined transplant but he is reconciled to that.  He is proud of his weight loss.  He did have his umbilical hernia surgery by Dr. Belinda and I reviewed the notes.  Lab review shows that in March 2024 his LFTs were slightly high with ALT being slightly high at [facility].  He needs a repeat test today.  We discussed his future course with the hope of him being stable and requiring antifibrotic's.  He is worried about hypoglycemia with his prednisone  10 mg he wants to reduce it.  We took a shared decision making to reduce it to 8 mg/day.  We also discussed future care options including clinical trials.  Discussed the ILD-Pro registry.  He is interested in this but currently because of his wife's issues he is not willing to participate.  We discussed inhaled treprostinil in our clinical trial protocol versus placebo.  He is interested in this but again we will have to wait till latter part of the year to enroll in this.  He is going to meet with the research coordinator to look at the consent form.    OV 09/16/2022  Subjective:  Patient ID: Brian Casey, male , DOB: 08-Oct-1950 , age 50 y.o. , MRN: [id number] , ADDRESS: [address] [place] [address] PCP Valora Agent, MD Patient Care Team: Valora Agent, MD as PCP - General (Family Medicine) Perla Evalene PARAS, MD as PCP - Cardiology  (Cardiology) Obadiah Coy, MD as Consulting Physician (Cardiothoracic Surgery)  This Provider for this visit: Treatment Team:  Attending Provider: Geronimo Amel, MD    09/16/2022 -   Chief Complaint  Patient presents with   Follow-up    Breathing has been stable. He is tolerating the pirfenidone  at 2 tablets TID.          HPI SAMAD THON 73 y.o. -returns for follow-up.  He is currently taking pirfenidone  full dose.  He is tolerating it well.  He reports overall being stable.  He is lost weight.  He is almost at goal weight right now.  He is very pleased about it.  He is on  Jardiance right now.  He is disappointed that [facility] [facility] partly because of his diabetes but he feels that his blood sugars better controlled now with Jardiance and also weight loss.  His wife is here with him.  She survived her cardiac bypass and long hospital stay.  She feels well.  She also affirms that his diabetes is under better control.  We took a joint decision to rerefer him to [facility].  Overall he is stable.  He did have safety labs mid June 2024 which showed elevated transaminase but when repeated September 11, 2022 these liver enzymes are normal.  I reviewed the external records for this.  He is now on his baseline prednisone  of 8 mg/day.  He uses oxygen with exertion.  Of note he is worried that he has asbestosis from occupational exposure.  He showed me a letter that he had a B reader review his films.  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  He did supply supplemental findings later after the visit.  The B reader did indicate concern for pneumoconiosis.  The B reader was satisfied with pleural thickening along with exposure history and latency and presence of ILD.  In August 2023 in our case conference it was felt that he did not have pleural classification.  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  On the other hand I did say there is reason to believe he has occupational aided lung disease given the fact he worked in the heavy damp area on the CT scan is more consistent with hypersensitive pneumonitis.  However there is a strong asbestos exposure history according to him and his latency..  Therefore I do agree and concede that even in the absence of pleural plaques he could easily have asbestosis.  Only lung biopsy can sort out if he has hypersensitive pneumonitis or not but it is too risky to do that.   He is able to talk to his attorney about this.      OV 01/12/2023  Subjective:  Patient ID: Brian Casey, male , DOB: 02/10/1951 , age 27 y.o. , MRN: [id number] , ADDRESS: [address] [place] [address] PCP Valora Agent, MD Patient Care Team: Valora Agent, MD as PCP - General (Family Medicine) Perla Evalene PARAS, MD as PCP - Cardiology (Cardiology) Obadiah Coy, MD as Consulting Physician (Cardiothoracic Surgery)  This Provider for this visit: Treatment Team:  Attending Provider: Geronimo Amel, MD    01/12/2023 -   Chief Complaint  Patient presents with   Follow-up    Pt denies any concerns     HPI KEIL PICKERING 73 y.o. -returns for follow-up.  From a respiratory standpoint he feels stable.  Symptom scores are stable.  He has further lost weight and is now an ideal body weight.  He is now on pirfenidone  low-dose protocol taking 2 pills 3 times daily.  His wife is sick after having had a major surgery at [facility] where there was complications with aneurysm laceration and therequiring stents.  She has a open wound.  He is a full-time caregiver.  Because of this he misses his afternoon dose of pirfenidone .  He is willing to try the 801 mg strength at 1 pill twice daily.  I reached out to our  pharmacist to see if we have samples that we can give him.  In addition he states his oxygen concentration at home 10 L is noisy and he wants the [place] to give him a 5 L oxygen concentration.  His pulse ox today was 96% room air at rest.  He did not have his PFTs.  This is because of schedule issues with his wife ilness   Continues daily prednisone  10mg  per day  OV 04/06/2023  Subjective:  Patient ID: Brian Casey, male , DOB: [birth year] , age 69 y.o. , MRN: [id number] , ADDRESS: [address] [place] [address] PCP Valora Agent, MD Patient Care Team: Valora Agent, MD as PCP - General (Family Medicine) Perla Evalene PARAS, MD as PCP - Cardiology (Cardiology) Obadiah Coy, MD as Consulting Physician (Cardiothoracic Surgery) Geronimo Amel, MD as Consulting Physician (Pulmonary Disease)  This Provider for this visit: Treatment Team:  Attending Provider: Geronimo Amel, MD   Chronic HP  - hx of asbestos exposure at work + damp environment  -On chronic daily prednisone ; reduced to 8 mg/day April 2024.-> this visit he said he is taking 10mg  per day  -August 2023 multidisciplinary case conference: Pleural thickening on the left side but no calcified pleural plaques and 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  - failed ofev  -  GI and weight loss - - Intolerant to Ofev  2023  - prior on immuran  - on ESBRIET   -  started February 2024    -Changed to low dose protocol in 2024 by nurse practitioner Tammy Parrott  Chronic Resp failure 4L [place] Obesity  - goal weight 160-165#  - 173#  on 09/16/2022  - 162# on 01/12/2023  - 158# on 04/06/2023    Right heart cath 02/03/2022- PAP . -> does not meet criteria for tyvaso  Lung cancer screening  - captured Jan 2025 - pfficial report pedning  04/06/2023 -  ILD/Chronic HP   Type of visit: Video Virtual Visit Identification of patient Brian Casey with 11-06-1950 and MRN [id number] - 2 person  identifier Risks: Risks, benefits, limitations of telephone visit explained. Patient understood and verbalized agreement to proceed Anyone else on call: his wife Patient location: video vsiit from his home This provider location: [address], [address]; [place]; [place] [address]. [facility]. [phone]   HPI Brian Casey 73 y.o. - feels stalble/. On pirfenidone  267mg   x 3 bid (low dose protocol). Wants 801mg  strength. On 10mg  prednisone . On o2 4L [place]. His last liver function test was normal at [facility] 01/13/2023 but his diabetes is poorly controlled with a hemoglobin of A1c of 7.7 on 01/13/2023.  His wife has recovered from her health issues.  She was placing in this visit she was independent historian.  He feels he is stable.  He is tolerating his pirfenidone  really well.  He wants larger pill.  He is also taking prednisone  10 mg.  He is willing to cut this down and see.  He says he is lost some more weight.  He wants to attend pulmonary rehabilitation.  Approval has to come through the [facility].  I did an order for that based on the specific instructions he gave the phone number.  He is also being recommended a lung cancer CT scan but he did have a high-resolution CT chest.  Personally visualized this there is no like lung cancer and the ILD itself looks stable compared  to 2 years ago.  The official report is pending.  He is again concerned whether he might have asbestosis.  I thought I saw a small tiny pleural calcification in the left  subpleural area in the base but official report is pending.  We discussed lung transplant again and he is not interested.  He is declined at [facility] he does not want to go to [facility] for evaluation.  We discussed the possibility of enrolling in time was a clinical trial if he has progression currently has no progression.  He is infused by this idea.     SYMPTOM SCALE - ILD 11/04/2021 01/13/2022 ofev  03/12/2022 Off ofev   07/21/2022  01/12/2023   Current weight  196# 181.8# - eeliugn around same 177# - esbiret 162#lordosis.  Protocol   420 feet in July 745 feet afte rehab In Oct 2023     O2 use RA but needing 4L [place] with exertin 4L rest and exertion. Sometimes 6L     Shortness of Breath 0 -> 5 scale with 5 being worst (score 6 If unable to do)      At rest 0 0  0 0  Simple tasks - showers, clothes change, eating, shaving 4 4  2 3   Household (dishes, doing bed, laundry) 4 4  3 3   Shopping 3 4  3 3   Walking level at own pace 3 4  3 3   Walking up Stairs 5 5  3 3   Total (30-36) Dyspnea Score 19 21  14 15     Non-dyspnea symptoms (0-> 5 scale) 11/04/2021 01/13/2022   07/21/2022  01/12/2023   How bad is your cough? 2.5 0  1 4  How bad is your fatigue 3 3  2 4   How bad is nausea 0 2  0 5  How bad is vomiting?  0 0  0 5  How bad is diarrhea? 0 5+  0.5 5  How bad is anxiety? 3 4  0 5  How bad is depression 2 3  0 5  Any chronic pain - if so where and how bad 0 x  x 5    Simple office walk 185 feet x  3 laps goal with forehead probe 01/13/2022  Needs walk test 01/12/2023   O2 used 4 88-92% RA at rest   Number laps completed Aim ws 3 and start of 2nd lap increased o2    Comments about pace avg    Resting Pulse Ox/HR 100% and 66/min  96% RA at rest  Final Pulse Ox/HR 86% and 98/min Desat to 79% with 5 sit-stand Dropped half way ast 110 to 88%  Desaturated </= 88% Yes at start of 2n lap    Desaturated <= 3% points yes    Got Tachycardic >/= 90/min yes    Symptoms at end of test Mod doe    Miscellaneous comments 6L [place] finished all 3 laps wth correction         PFT     Latest Ref Rng & Units 03/09/2023   10:20 AM 05/07/2022    3:38 PM 12/12/2021   10:02 AM 11/11/2015    7:38 AM  ILD indicators  FVC-Pre L 1.47  1.57  1.41  1.20   FVC-Predicted Pre % 37  40  35  28   FVC-Post L   1.46  1.35   FVC-Predicted Post %   36  32   TLC L   2.23  2.47   TLC  Predicted %   34  38   DLCO uncorrected  ml/min/mmHg 6.75  5.76  5.38  8.93   DLCO UNC %Pred % 28  24  22  31    DLCO Corrected ml/min/mmHg   5.38  10.84   DLCO COR %Pred %   22  38       LAB RESULTS last 96 hours No results found.  LAB RESULTS last 90 days Recent Results (from the past 2160 hours)  Pulmonary Function Test [facility] Only     Status: None   Collection Time: 03/09/23 10:20 AM  Result Value Ref Range   REPORT FILE NAME [id number]    FVC-Pre 1.47 L   FVC-%Pred-Pre 37 %   FEV1-Pre 1.23 L   FEV1-%Pred-Pre 42 %   FEV6-Pre 1.47 L   FEV6-%Pred-Pre 39 %   Pre FEV1/FVC ratio 84 %   FEV1FVC-%Pred-Pre 114 %   Pre FEV6/FVC Ratio 100 %   FEV6FVC-%Pred-Pre 106 %   FEF 25-75 Pre 1.14 L/sec   FEF2575-%Pred-Pre 51 %   DLCO unc 6.75 ml/min/mmHg   DLCO unc % pred 28 %   DL/VA 7.16 ml/min/mmHg/L   DL/VA % pred 69 %         has a past medical history of Adenomatous colon polyp, Aortic root dilatation (HCC), Arthritis, CAD (coronary artery disease), Chronic respiratory failure with hypoxia (HCC), CKD (chronic kidney disease), stage III (HCC), Diastolic dysfunction, Diverticulosis, GERD (gastroesophageal reflux disease), Grade I diastolic dysfunction, High cholesterol, History of bronchitis, Hypertension (10/17/2013), Hypothyroidism, ILD (interstitial lung disease) (HCC), Mild pulmonary hypertension (HCC), PAD (peripheral artery disease) (HCC), Post-op atrial fibrillation (HCC) (11/06/2015), Shortness of breath, Type II diabetes mellitus (HCC), and Wears glasses.   reports that he quit smoking about 7 years ago. His smoking use included cigarettes. He started smoking about 55 years ago. He has a 48 pack-year smoking history. He has never used smokeless tobacco.  Past Surgical History:  Procedure Laterality Date   CARDIAC CATHETERIZATION Right 11/06/2015   Procedure: Left Heart Cath and Coronary Angiography;  Surgeon: Evalene JINNY Lunger, MD;  Location: [facility] INVASIVE CV LAB;  Service: Cardiovascular;  Laterality:  Right;   CATARACT EXTRACTION W/PHACO Right 09/09/2022   Procedure: CATARACT EXTRACTION PHACO AND INTRAOCULAR LENS PLACEMENT (IOC) RIGHT DIABETIC OMIDRIA  11.17 01:06.5;  Surgeon: Enola Feliciano Hugger, MD;  Location: [facility];  Service: Ophthalmology;  Laterality: Right;   CATARACT EXTRACTION W/PHACO Left 10/08/2022   Procedure: CATARACT EXTRACTION PHACO AND INTRAOCULAR LENS PLACEMENT (IOC) LEFT DIABETIC OMIDRIA  12.90 01:11.8;  Surgeon: Enola Feliciano Hugger, MD;  Location: [facility];  Service: Ophthalmology;  Laterality: Left;   CHOLECYSTECTOMY N/A 01/17/2016   Procedure: LAPAROSCOPIC CHOLECYSTECTOMY WITH INTRAOPERATIVE CHOLANGIOGRAM;  Surgeon: Elon Pacini, MD;  Location: [facility] OR;  Service: General;  Laterality: N/A;   COLONOSCOPY     COLONOSCOPY N/A 11/19/2021   Procedure: COLONOSCOPY;  Surgeon: Toledo, Ladell POUR, MD;  Location: [facility]COPY;  Service: Gastroenterology;  Laterality: N/A;   CORONARY ARTERY BYPASS GRAFT N/A 11/14/2015   Procedure: CORONARY ARTERY BYPASS GRAFTING (CABG) x 4;  Surgeon: Maude Fleeta Ochoa, MD;  Location: [facility] OR;  Service: Open Heart Surgery;  Laterality: N/A;   ENDARTERECTOMY Left 11/14/2015   Procedure: LEFT ENDARTERECTOMY CAROTID;  Surgeon: Penne Lonni [place], MD;  Location: [facility] OR;  Service: Vascular;  Laterality: Left;   ESOPHAGOGASTRODUODENOSCOPY N/A 11/19/2021   Procedure: ESOPHAGOGASTRODUODENOSCOPY (EGD);  Surgeon: Toledo, Ladell POUR, MD;  Location: [facility] END[facility];  Service: Gastroenterology;  Laterality: N/A;   EXCISION  OF MESH N/A 06/18/2022   Procedure: EXCISION OF MESH;  Surgeon: Belinda Cough, MD;  Location: [facility] OR;  Service: General;  Laterality: N/A;   FINGER GANGLION CYST EXCISION Left X 3   index finger X 2; thumb X 1   HEMORRHOID SURGERY  1982   HERNIA REPAIR     INSERTION OF MESH N/A 11/16/2013   Procedure: INSERTION OF MESH;  Surgeon: Cough POUR. Belinda, MD;  Location: [facility] OR;  Service: General;  Laterality: N/A;   IR GENERIC  HISTORICAL  11/08/2015   IR PERC CHOLECYSTOSTOMY 11/08/2015 Ami Bellman, DO [facility]   IR GENERIC HISTORICAL  12/13/2015   IR CHOLANGIOGRAM EXISTING TUBE 12/13/2015 [facility]   IR GENERIC HISTORICAL  12/25/2015   IR CATHETER TUBE CHANGE 12/25/2015 Norleen Roulette, MD [facility]   IR GENERIC HISTORICAL  12/12/2015   IR RADIOLOGIST EVAL & MGMT 12/12/2015 Ozell Specking, MD [facility]   RIGHT HEART CATH Right 02/03/2022   Procedure: RIGHT HEART CATH;  Surgeon: Mady Bruckner, MD;  Location: [facility];  Service: Cardiovascular;  Laterality: Right;   TEE WITHOUT CARDIOVERSION N/A 11/14/2015   Procedure: TRANSESOPHAGEAL ECHOCARDIOGRAM (TEE);  Surgeon: Maude Fleeta Ochoa, MD;  Location: [facility] OR;  Service: Open Heart Surgery;  Laterality: N/A;   UMBILICAL HERNIA REPAIR N/A 11/16/2013   Procedure: UMBILICAL HERNIA REPAIR;  Surgeon: Cough POUR. Belinda, MD;  Location: [facility] OR;  Service: General;  Laterality: N/A;   UMBILICAL HERNIA REPAIR N/A 06/18/2022   Procedure: OPEN UMBILICAL HERNIA REPAIR;  Surgeon: Belinda Cough, MD;  Location[facility] OR;  Service: General;  Laterality: N/A;   VEIN HARVEST Right 11/14/2015   Procedure: RIGHT LEG GREATER SAPHENOUS VEIN HARVEST;  Surgeon: Maude Fleeta Ochoa, MD;  Location: [facility] OR;  Service: Open Heart Surgery;  Laterality: Right;    Allergies  Allergen Reactions   Penicillins Nausea And Vomiting and Other (See Comments)    REACTION: Vomiting Has patient had a PCN reaction causing immediate rash, facial/tongue/throat swelling, SOB or lightheadedness with hypotension: Yes Has patient had a PCN reaction causing severe rash involving mucus membranes or skin necrosis: No Has patient had a PCN reaction that required hospitalization No Has patient had a PCN reaction occurring within the last 10 years: No If all of the above answers are NO, then may proceed with Cephalosporin use.   Amlodipine Swelling   Bupropion Hcl     REACTION: Severe constipation, insomnia    Chlorthalidone Other (See Comments)    Other reaction(s): Hyponatremia   Metformin  Diarrhea   Nintedanib Diarrhea and Other (See Comments)   Terazosin Other (See Comments)    Immunization History  Administered Date(s) Administered   Fluad Quad(high Dose 65+) 01/13/2022   Influenza Inj Mdck Quad Pf 12/25/2015   Influenza Split 01/26/2011, 02/10/2012   Influenza Whole 01/22/2004, 02/05/2008, 12/26/2008   Influenza, High Dose Seasonal PF 01/12/2023   Influenza,inj,Quad PF,6+ Mos 12/29/2012, 01/10/2014, 12/11/2014   Influenza-Unspecified 12/25/2016, 12/22/2017, 12/21/2018, 12/07/2019, 12/24/2020   PFIZER Comirnaty(Gray Top)Covid-19 Tri-Sucrose Vaccine 04/09/2019, 05/02/2019, 12/28/2019, 01/01/2021   Pneumococcal Conjugate-13 03/29/2014, 11/06/2014, 11/15/2017   Pneumococcal Polysaccharide-23 11/05/2016   RSV,unspecified 02/19/2022   Td 08/22/1998, 08/18/2013   Unspecified SARS-COV-2 Vaccination 02/20/2022   Zoster, Live 03/29/2014    Family History  Problem Relation Age of Onset   Hypertension Mother    Diabetes Mother    Depression Mother    Colon cancer Mother    Hyperthyroidism Father    Hypertension Father    Prostate cancer  Father    Diabetes Maternal Grandmother    Lung cancer Maternal Grandmother        non smoker     Current Outpatient Medications:    aspirin  EC 81 MG tablet, Take 81 mg by mouth daily. Swallow whole., Disp: , Rfl:    atorvastatin  (LIPITOR) 40 MG tablet, Take 40 mg by mouth daily., Disp: , Rfl:    carvedilol  (COREG ) 12.5 MG tablet, Take 12.5 mg by mouth 2 (two) times daily with a meal., Disp: , Rfl:    empagliflozin (JARDIANCE) 10 MG TABS tablet, Take 25 mg by mouth daily., Disp: , Rfl:    furosemide  (LASIX ) 20 MG tablet, Take 20 mg by mouth daily., Disp: , Rfl:    levothyroxine  (SYNTHROID ) 100 MCG tablet, Take 100 mcg by mouth daily before breakfast., Disp: , Rfl:    loperamide (IMODIUM) 2 MG capsule, Take 2 mg by mouth as needed for diarrhea or  loose stools., Disp: , Rfl:    omeprazole  (PRILOSEC) 20 MG capsule, Take 1 capsule (20 mg total) by mouth daily. OFFICE VISIT REQUIRED FOR ADDITIONAL REFILLS, Disp: 30 capsule, Rfl: 0   potassium chloride  (KLOR-CON ) 10 MEQ tablet, Take 10 mEq by mouth daily., Disp: , Rfl:    PRECISION QID TEST test strip, Use 2 (two) times daily, Disp: , Rfl:    predniSONE  (DELTASONE ) 10 MG tablet, TAKE 1 TABLET DAILY WITH   BREAKFAST, Disp: 90 tablet, Rfl: 1   tadalafil  (CIALIS ) 10 MG tablet, Take 1 tablet (10 mg total) by mouth daily as needed for erectile dysfunction., Disp: 90 tablet, Rfl: 3      Objective:   There were no vitals filed for this visit.  Estimated body mass index is 25.47 kg/m as calculated from the following:   Height as of 01/12/23: 5' 7 (1.702 m).   Weight as of 01/12/23: 162 lb 9.6 oz (73.8 kg).  @WEIGHTCHANGE @  There were no vitals filed for this visit.   Physical Exam   General: No distress. Looks wll O2 at rest: yes Cane present: no Sitting in wheel chair: no Frail: no Obese: no Neuro: Alert and Oriented x 3. GCS 15. Speech normal Psych: Pleasant       Assessment:       ICD-10-CM   1. ILD (interstitial lung disease) (HCC)  J84.9 AMB referral to pulmonary rehabilitation    Hepatic function panel    2. Hypersensitivity pneumonitis (HCC)  J67.9 AMB referral to pulmonary rehabilitation    Hepatic function panel    3. Current chronic use of systemic steroids  Z79.52 AMB referral to pulmonary rehabilitation    Hepatic function panel    4. High risk medication use  Z79.899 AMB referral to pulmonary rehabilitation    Hepatic function panel    5. Therapeutic drug monitoring  Z51.81 AMB referral to pulmonary rehabilitation    Hepatic function panel    6. DOE (dyspnea on exertion)  R06.09 AMB referral to pulmonary rehabilitation    Hepatic function panel    7. Chronic respiratory failure with hypoxia (HCC)  J96.11 AMB referral to pulmonary rehabilitation     Hepatic function panel         Plan:     Patient Instructions     ICD-10-CM   1. ILD (interstitial lung disease) (HCC)  J84.9     2. Hypersensitivity pneumonitis (HCC)  J67.9     3. Current chronic use of systemic steroids  Z79.52     4. H/O asbestos  exposure  Z77.090        - stable clinically  -based on pulmonary function testing and also Dr. Reeves visualization of the CT scan chest January 2025 compared to 2023 [official report pending] -It is possible this 1 small pleural calcification in the left lower lobe subpleural area raising the possibility that this could be asbestosis [official report pending] -Noted interest in switching to pirfenidone  801 mg pill twice daily [still low-dose protocol] -Shared decision making to try a slightly lower dose of prednisone   Plan -Await formal report on CT scan of the chest - Continue low-dose Esbriet /pirfenidone  protocol  -Message sent to pharmacist to switch you over to 801 mg twice daily  -Check liver function test at [facility] [ordered on] -Continue prednisone  but let us  lower the dose from 10 mg daily  -Take 10 mg on Tuesday Thursday Saturday and Sunday and 5 mg on Monday Wednesday Friday  -Refer pulmonary rehabilitation [respite distress recorder for care coordinators to get [facility] approval]  -Continue 4 L -6L N nasal cannula with exertion; -  goal pulse ox greater than 86- 88%  - consider clinical trials -   - Tyvaso trials in the future if there is progression     #Obesity with a BMI greater than 30 and hyperglycemia  - Glad there is weight loss - BMI now 25.5 and ideal body weight  Plan  - Maintain current weight -     Follow-up - Return to see Dr. Geronimo)( 30-minute visit in l 12 weeks but after spiro/dlco  - symptims score and sit/stand a   FOLLOWUP Return in about 3 months (around 07/05/2023) for 30 min visit, after Spiro and DLCO, with Dr Geronimo, Face to Face Visit, ILD.    SIGNATURE    Dr.  Dorethia Geronimo, M.D., F.C.C.P,  Pulmonary and Critical Care Medicine Staff Physician, [facility] Director - Interstitial Lung Disease  Program  Pulmonary Fibrosis [facility] [place], [place], [address]  Pager: [phone], If no answer or between  15:00h - 7:00h: call [phone] Telephone: [phone]  5:15 PM 04/06/2023   Moderate Complexity MDM OFFICE  2021 E/M guidelines, first released in 2021, with minor revisions added in 2023 and 2024 Must meet the requirements for 2 out of 3 dimensions to qualify.    Number and complexity of problems addressed Amount and/or complexity of data reviewed Risk of complications and/or morbidity  One or more chronic illness with mild exacerbation, OR progression, OR  side effects of treatment  Two or more stable chronic illnesses  One undiagnosed new problem with uncertain prognosis  One acute illness with systemic symptoms   One Acute complicated injury Must meet the requirements for 1 of 3 of the categories)  Category 1: Tests and documents, historian  Any combination of 3 of the following:  Assessment requiring an independent historian  Review of prior external note(s) from each unique source  Review of results of each unique test  Ordering of each unique test    Category 2: Interpretation of tests   Independent interpretation of a test performed by another physician/other qualified health care professional (not separately reported)  Category 3: Discuss management/tests  Discussion of management or test interpretation with external physician/other qualified health care professional/appropriate source (not separately reported) Moderate risk of morbidity from additional diagnostic testing or treatment Examples only:  Prescription drug management  Decision regarding minor surgery with identfied patient or procedure  risk factors  Decision regarding elective  major surgery without identified patient or procedure risk factors  Diagnosis or treatment significantly limited by social determinants of health             HIGh Complexity  OFFICE   2021 E/M guidelines, first released in 2021, with minor revisions added in 2023. Must meet the requirements for 2 out of 3 dimensions to qualify.    Number and complexity of problems addressed Amount and/or complexity of data reviewed Risk of complications and/or morbidity  Severe exacerbation of chronic illness  Acute or chronic illnesses that may pose a threat to life or bodily function, e.g., multiple trauma, acute MI, pulmonary embolus, severe respiratory distress, progressive rheumatoid arthritis, psychiatric illness with potential threat to self or others, peritonitis, acute renal failure, abrupt change in neurological status Must meet the requirements for 2 of 3 of the categories)  Category 1: Tests and documents, historian  Any combination of 3 of the following:  Assessment requiring an independent historian  Review of prior external note(s) from each unique source  Review of results of each unique test  Ordering of each unique test    Category 2: Interpretation of tests    Independent interpretation of a test performed by another physician/other qualified health care professional (not separately reported)  Category 3: Discuss management/tests  Discussion of management or test interpretation with external physician/other qualified health care professional/appropriate source (not separately reported) - pharmacy  HIGH risk of morbidity from additional diagnostic testing or treatment Examples only:  Drug therapy requiring intensive monitoring for toxicity  Decision for elective major surgery with identified pateint or procedure risk factors  Decision regarding hospitalization or escalation of level of care  Decision for DNR or to de-escalate care   Parenteral controlled   substances

## 2023-04-06 NOTE — Patient Instructions (Addendum)
 ICD-10-CM   1. ILD (interstitial lung disease) (HCC)  J84.9     2. Hypersensitivity pneumonitis (HCC)  J67.9     3. Current chronic use of systemic steroids  Z79.52     4. H/O asbestos exposure  Z77.090        - stable clinically  -based on pulmonary function testing and also Dr. Reeves visualization of the CT scan chest January 2025 compared to 2023 [official report pending] -It is possible this 1 small pleural calcification in the left lower lobe subpleural area raising the possibility that this could be asbestosis [official report pending] -Noted interest in switching to pirfenidone  801 mg pill twice daily [still low-dose protocol] -Shared decision making to try a slightly lower dose of prednisone   Plan -Await formal report on CT scan of the chest - Continue low-dose Esbriet /pirfenidone  protocol  -Message sent to pharmacist to switch you over to 801 mg twice daily  -Check liver function test at [facility] [ordered on] -Continue prednisone  but let us  lower the dose from 10 mg daily  -Take 10 mg on Tuesday Thursday Saturday and Sunday and 5 mg on Monday Wednesday Friday  -Refer pulmonary rehabilitation [respite distress recorder for care coordinators to get [facility] approval]  -Continue 4 L -6L N nasal cannula with exertion; -  goal pulse ox greater than 86- 88%  - consider clinical trials -   - Tyvaso trials in the future if there is progression  -Respect lack of interest in lung transplant at this point in time   #Obesity with a BMI greater than 30 and hyperglycemia  - Glad there is weight loss - BMI now 25.5 and ideal body weight  Plan  - Maintain current weight -   #Lung cancer screening  -Captured on CT chest January 2025.    Plan - No special CT chest required but of official report of the high-res CT  Follow-up - Return to see Dr. Geronimo)( 30-minute visit in l 12 weeks but after spiro/dlco  - symptims score and sit/stand a

## 2023-04-06 NOTE — Telephone Encounter (Signed)
    [name] 0 devki - he likes to go t 801mg  bid (low dose protocol) instead of 267 mg pill   Thanks    SIGNATURE    Dr. Dorethia Cave, M.D., F.C.C.P,  Pulmonary and Critical Care Medicine Staff Physician, [facility] Director - Interstitial Lung Disease  Program  Pulmonary Fibrosis [facility] at [facility] [place], [place], [address]   Pager: [phone], If no answer  -> Check AMION or Try [phone] Telephone (clinical office): [phone] Telephone (research): [phone]  5:10 PM 04/06/2023

## 2023-04-07 ENCOUNTER — Encounter: Payer: Self-pay | Admitting: Internal Medicine

## 2023-04-07 MED ORDER — PIRFENIDONE 801 MG PO TABS
801.0000 mg | ORAL_TABLET | Freq: Two times a day (BID) | ORAL | 1 refills | Status: DC
Start: 2023-04-07 — End: 2023-11-23

## 2023-04-07 NOTE — Telephone Encounter (Signed)
 Rx for pirfenidone  801mg  tablets (1 tab twice daily) sent to [facility]  MyChart message sent to patient with update  Geraldene Kleine, PharmD, MPH, BCPS, CPP Clinical Pharmacist (Rheumatology and Pulmonology)

## 2023-04-09 ENCOUNTER — Encounter: Payer: Self-pay | Admitting: Internal Medicine

## 2023-04-09 ENCOUNTER — Telehealth: Payer: Self-pay | Admitting: Internal Medicine

## 2023-04-09 NOTE — Telephone Encounter (Signed)
Patient needs paperwork to be sent to the [place] Sharlene Motts) so that he can be approved for pulmonary rehab. He needs an update for when this is done. Call back number [phone] or [phone]

## 2023-04-12 ENCOUNTER — Other Ambulatory Visit (HOSPITAL_COMMUNITY): Payer: Self-pay

## 2023-04-12 NOTE — Telephone Encounter (Signed)
Patient checking on message for [facility] approval. Patient phone number is [phone] and [phone].

## 2023-04-13 ENCOUNTER — Other Ambulatory Visit: Payer: Self-pay

## 2023-04-13 ENCOUNTER — Other Ambulatory Visit (HOSPITAL_BASED_OUTPATIENT_CLINIC_OR_DEPARTMENT_OTHER): Payer: Self-pay

## 2023-04-13 ENCOUNTER — Telehealth: Payer: Self-pay | Admitting: Internal Medicine

## 2023-04-13 ENCOUNTER — Other Ambulatory Visit (HOSPITAL_COMMUNITY): Payer: Self-pay

## 2023-04-13 DIAGNOSIS — J849 Interstitial pulmonary disease, unspecified: Secondary | ICD-10-CM

## 2023-04-13 MED ORDER — ATORVASTATIN CALCIUM 40 MG PO TABS
40.0000 mg | ORAL_TABLET | Freq: Every day | ORAL | 3 refills | Status: DC
  Filled 2023-04-13: qty 90, 90d supply, fill #0

## 2023-04-13 MED ORDER — FUROSEMIDE 20 MG PO TABS
20.0000 mg | ORAL_TABLET | Freq: Every day | ORAL | 3 refills | Status: DC
  Filled 2023-04-13: qty 90, 90d supply, fill #0
  Filled 2023-07-13: qty 90, 90d supply, fill #1
  Filled 2023-10-06: qty 90, 90d supply, fill #2

## 2023-04-13 MED ORDER — CARVEDILOL 12.5 MG PO TABS
12.5000 mg | ORAL_TABLET | Freq: Two times a day (BID) | ORAL | 3 refills | Status: DC
  Filled 2023-04-13: qty 180, 90d supply, fill #0
  Filled 2023-07-13: qty 180, 90d supply, fill #1

## 2023-04-13 MED ORDER — POTASSIUM CHLORIDE ER 10 MEQ PO TBCR
EXTENDED_RELEASE_TABLET | ORAL | 1 refills | Status: AC
  Filled 2023-04-13: qty 90, 30d supply, fill #0

## 2023-04-13 MED ORDER — PREDNISONE 10 MG PO TABS
10.0000 mg | ORAL_TABLET | Freq: Every day | ORAL | 1 refills | Status: DC
  Filled 2023-04-13: qty 90, 90d supply, fill #0

## 2023-04-13 MED ORDER — LEVOTHYROXINE SODIUM 100 MCG PO TABS
100.0000 ug | ORAL_TABLET | Freq: Every day | ORAL | 3 refills | Status: DC
  Filled 2023-04-13: qty 90, 90d supply, fill #0

## 2023-04-13 MED ORDER — GLUCOSE BLOOD VI STRP
ORAL_STRIP | 3 refills | Status: AC
  Filled 2023-04-13: qty 200, 90d supply, fill #0
  Filled 2023-07-13: qty 200, 90d supply, fill #1
  Filled 2023-10-06: qty 200, 90d supply, fill #2

## 2023-04-13 MED ORDER — ONDANSETRON HCL 4 MG PO TABS
4.0000 mg | ORAL_TABLET | Freq: Three times a day (TID) | ORAL | 3 refills | Status: DC | PRN
  Filled 2023-04-13: qty 20, 7d supply, fill #0

## 2023-04-13 MED ORDER — PREDNISONE 1 MG PO TABS
8.0000 mg | ORAL_TABLET | Freq: Every day | ORAL | 6 refills | Status: DC
  Filled 2023-04-13: qty 270, 33d supply, fill #0

## 2023-04-13 NOTE — Telephone Encounter (Signed)
Fredric Mare, is there any update we can give him about VA referral for the rehab? It looks like the referral needed to be put under [facility] so I placed a new one

## 2023-04-13 NOTE — Telephone Encounter (Signed)
[facility], please advise on sending documentation for outgoing referral to pulm rehab through the [place] to get authorization

## 2023-04-13 NOTE — Telephone Encounter (Signed)
I thought I did an order

## 2023-04-13 NOTE — Telephone Encounter (Signed)
It looks like the referral was closed  I will place a new referral

## 2023-04-13 NOTE — Telephone Encounter (Signed)
PT sent a [employer] message. I have opened a new tel encounter per Protocol. The last AVS states the following:  Refer pulmonary rehabilitation [respite distress recorder for care coordinators to get [facility] approval]   Here is his message:  Dr. Marchelle Gearing, When will this AMB referral to pulmonary rehabilitation be sent to the [facility] for approval? Please advise, Aswad Mcmanus   [facility], I am trying to find out if the AMB referral to pulmonary rehabilitation has been sent to the [facility] for approval? The fax number is [phone].  The [facility] has called me and let me know that they are waiting for this request so they can process it.     When it is processed it will be sent to the following: Where: [facility] Address: [address] [place] [address] Phone: [phone] Expires: 04/05/2024 (requested)   Please advise what the status of this request is? Wyonia Hough [phone]

## 2023-04-14 ENCOUNTER — Other Ambulatory Visit: Payer: Self-pay

## 2023-04-14 NOTE — Telephone Encounter (Signed)
I am not sure if this has been sent to the [place] yet, but I have just faxed a request to the [place] for this.  I will reach out to the pt & update once I receive the fax confirm.

## 2023-04-14 NOTE — Telephone Encounter (Signed)
Patient checking on referral for pulmonary rehab. Needs VA approval. Patient phone number is [phone].

## 2023-04-14 NOTE — Telephone Encounter (Signed)
Faxed order over to [facility] and made Pt aware that it was faxed over to [facility].

## 2023-04-19 ENCOUNTER — Encounter: Payer: Self-pay | Admitting: Podiatry

## 2023-04-19 ENCOUNTER — Ambulatory Visit (INDEPENDENT_AMBULATORY_CARE_PROVIDER_SITE_OTHER): Payer: PPO | Admitting: Podiatry

## 2023-04-19 ENCOUNTER — Other Ambulatory Visit: Payer: Self-pay

## 2023-04-19 ENCOUNTER — Other Ambulatory Visit (HOSPITAL_COMMUNITY): Payer: Self-pay

## 2023-04-19 DIAGNOSIS — L600 Ingrowing nail: Secondary | ICD-10-CM

## 2023-04-19 DIAGNOSIS — I6529 Occlusion and stenosis of unspecified carotid artery: Secondary | ICD-10-CM | POA: Insufficient documentation

## 2023-04-19 DIAGNOSIS — R600 Localized edema: Secondary | ICD-10-CM | POA: Insufficient documentation

## 2023-04-19 MED ORDER — NEOMYCIN-POLYMYXIN-HC 1 % OT SOLN: OTIC | 1 refills | Status: DC

## 2023-04-19 NOTE — Progress Notes (Signed)
Subjective:  Patient ID: Brian Casey, male    DOB: 1950/06/22,  MRN: [id number] HPI Chief Complaint  Patient presents with   Toe Pain    Hallux left - lateral border - tender, 2nd toe presses against the area   Nail Problem    4th toe left - thick nail growing crooked   New Patient (Initial Visit)    Est pt 2016    73 y.o. male presents with the above complaint.   ROS: Denies fever chills nausea vomit muscle aches pains calf pain back pain chest pain shortness of breath.  Past Medical History:  Diagnosis Date   Adenomatous colon polyp    Aortic root dilatation (HCC)    Noted on echo in August 2023.  Measured 39 mm.   Arthritis    "lower spine; fingers" (11/07/2015)   CAD (coronary artery disease)    a. 10/2015 NSTEMI/Cath: LM 40, LAD 70p, LCX 90ost, RI 90ost, RCA 60-70p, 90-80m/d, 40-50d; b. 10/2015 CABG x 4: LIMA->LAD, VG->Diag, VG->RI, VG->RPDA.   Chronic respiratory failure with hypoxia (HCC)    CKD (chronic kidney disease), stage III (HCC)    Diastolic dysfunction    a. 10/2021 Echo: EF 60-65%,   Diverticulosis    GERD (gastroesophageal reflux disease)    Grade I diastolic dysfunction    High cholesterol    History of bronchitis    Hypertension 10/17/2013   Hypothyroidism    ILD (interstitial lung disease) (HCC)    Mild pulmonary hypertension (HCC)    PAD (peripheral artery disease) (HCC)    s/p left carotid endarterectomy   Post-op atrial fibrillation (HCC) 11/06/2015   Shortness of breath    Type II diabetes mellitus (HCC)    type II   Wears glasses    Past Surgical History:  Procedure Laterality Date   CARDIAC CATHETERIZATION Right 11/06/2015   Procedure: Left Heart Cath and Coronary Angiography;  Surgeon: Antonieta Iba, MD;  Location: [facility] INVASIVE CV LAB;  Service: Cardiovascular;  Laterality: Right;   CATARACT EXTRACTION W/PHACO Right 09/09/2022   Procedure: CATARACT EXTRACTION PHACO AND INTRAOCULAR LENS PLACEMENT (IOC) RIGHT DIABETIC OMIDRIA 11.17  01:06.5;  Surgeon: Estanislado Pandy, MD;  Location: [facility];  Service: Ophthalmology;  Laterality: Right;   CATARACT EXTRACTION W/PHACO Left 10/08/2022   Procedure: CATARACT EXTRACTION PHACO AND INTRAOCULAR LENS PLACEMENT (IOC) LEFT DIABETIC OMIDRIA 12.90 01:11.8;  Surgeon: Estanislado Pandy, MD;  Location: [facility];  Service: Ophthalmology;  Laterality: Left;   CHOLECYSTECTOMY N/A 01/17/2016   Procedure: LAPAROSCOPIC CHOLECYSTECTOMY WITH INTRAOPERATIVE CHOLANGIOGRAM;  Surgeon: Claud Kelp, MD;  Location: [facility] OR;  Service: General;  Laterality: N/A;   COLONOSCOPY     COLONOSCOPY N/A 11/19/2021   Procedure: COLONOSCOPY;  Surgeon: Toledo, Boykin Nearing, MD;  Location: [facility] ENDOSCOPY;  Service: Gastroenterology;  Laterality: N/A;   CORONARY ARTERY BYPASS GRAFT N/A 11/14/2015   Procedure: CORONARY ARTERY BYPASS GRAFTING (CABG) x 4;  Surgeon: Kerin Perna, MD;  Location: [facility] OR;  Service: Open Heart Surgery;  Laterality: N/A;   ENDARTERECTOMY Left 11/14/2015   Procedure: LEFT ENDARTERECTOMY CAROTID;  Surgeon: Maeola Harman, MD;  Location: [facility] OR;  Service: Vascular;  Laterality: Left;   ESOPHAGOGASTRODUODENOSCOPY N/A 11/19/2021   Procedure: ESOPHAGOGASTRODUODENOSCOPY (EGD);  Surgeon: Toledo, Boykin Nearing, MD;  Location: [facility] ENDOSCOPY;  Service: Gastroenterology;  Laterality: N/A;   EXCISION OF MESH N/A 06/18/2022   Procedure: EXCISION OF MESH;  Surgeon: Manus Rudd, MD;  Location: [facility] OR;  Service: General;  Laterality:  N/A;   FINGER GANGLION CYST EXCISION Left X 3   "index finger X 2; thumb X 1"   HEMORRHOID SURGERY  1982   HERNIA REPAIR     INSERTION OF MESH N/A 11/16/2013   Procedure: INSERTION OF MESH;  Surgeon: Wilmon Arms. Corliss Skains, MD;  Location: [facility] OR;  Service: General;  Laterality: N/A;   IR GENERIC HISTORICAL  11/08/2015   IR PERC CHOLECYSTOSTOMY 11/08/2015 Gilmer Mor, DO [facility]   IR GENERIC HISTORICAL  12/13/2015   IR CHOLANGIOGRAM EXISTING TUBE  12/13/2015 [facility]   IR GENERIC HISTORICAL  12/25/2015   IR CATHETER TUBE CHANGE 12/25/2015 Simonne Come, MD [facility]   IR GENERIC HISTORICAL  12/12/2015   IR RADIOLOGIST EVAL & MGMT 12/12/2015 Berdine Dance, MD [facility]   RIGHT HEART CATH Right 02/03/2022   Procedure: RIGHT HEART CATH;  Surgeon: Yvonne Kendall, MD;  Location: [facility] INVASIVE CV LAB;  Service: Cardiovascular;  Laterality: Right;   TEE WITHOUT CARDIOVERSION N/A 11/14/2015   Procedure: TRANSESOPHAGEAL ECHOCARDIOGRAM (TEE);  Surgeon: Kerin Perna, MD;  Location: [facility] OR;  Service: Open Heart Surgery;  Laterality: N/A;   UMBILICAL HERNIA REPAIR N/A 11/16/2013   Procedure: UMBILICAL HERNIA REPAIR;  Surgeon: Wilmon Arms. Corliss Skains, MD;  Location: [facility] OR;  Service: General;  Laterality: N/A;   UMBILICAL HERNIA REPAIR N/A 06/18/2022   Procedure: OPEN UMBILICAL HERNIA REPAIR;  Surgeon: Manus Rudd, MD;  Location[facility] OR;  Service: General;  Laterality: N/A;   VEIN HARVEST Right 11/14/2015   Procedure: RIGHT LEG GREATER SAPHENOUS VEIN HARVEST;  Surgeon: Kerin Perna, MD;  Location: [facility] OR;  Service: Open Heart Surgery;  Laterality: Right;    Current Outpatient Medications:    NEOMYCIN-POLYMYXIN-HYDROCORTISONE (CORTISPORIN) 1 % SOLN OTIC solution, Apply 1-2 drops to toe BID after soaking, Disp: 10 mL, Rfl: 1   aspirin EC 81 MG tablet, Take 81 mg by mouth daily. Swallow whole., Disp: , Rfl:    atorvastatin (LIPITOR) 40 MG tablet, Take 1 tablet (40 mg total) by mouth daily., Disp: 90 tablet, Rfl: 3   carvedilol (COREG) 12.5 MG tablet, Take 1 tablet (12.5 mg total) by mouth 2 (two) times daily with a meal., Disp: 180 tablet, Rfl: 3   empagliflozin (JARDIANCE) 10 MG TABS tablet, Take 25 mg by mouth daily., Disp: , Rfl:    furosemide (LASIX) 20 MG tablet, Take 1 tablet (20 mg total) by mouth daily., Disp: 90 tablet, Rfl: 3   glucose blood test strip, Use to test blood sugar twice a day, Disp: 200 each, Rfl: 3   levothyroxine  (SYNTHROID) 100 MCG tablet, Take 1 tablet (100 mcg total) by mouth, on an empty stomach, daily with a glass of water at least 30-60 minutes before breakfast., Disp: 90 tablet, Rfl: 3   loperamide (IMODIUM) 2 MG capsule, Take 2 mg by mouth as needed for diarrhea or loose stools., Disp: , Rfl:    omeprazole (PRILOSEC) 20 MG capsule, Take 1 capsule (20 mg total) by mouth daily. OFFICE VISIT REQUIRED FOR ADDITIONAL REFILLS, Disp: 30 capsule, Rfl: 0   ondansetron (ZOFRAN) 4 MG tablet, Take 1 tablet (4 mg total) by mouth every 8 (eight) hours as needed for nausea and/or vomitting., Disp: 20 tablet, Rfl: 3   Pirfenidone 801 MG TABS, Take 1 tablet (801 mg total) by mouth in the morning and at bedtime. **low dose as maintenance**, Disp: 180 tablet, Rfl: 1   potassium chloride (KLOR-CON) 10 MEQ tablet, Take 2 tablets (20 mEq  total) by mouth 2 (two) times daily for 3 days, THEN 1 tablet (10 mEq total) daily., Disp: 90 tablet, Rfl: 1   PRECISION QID TEST test strip, Use 2 (two) times daily, Disp: , Rfl:    predniSONE (DELTASONE) 10 MG tablet, Take 1 tablet (10 mg total) by mouth daily with breakfast., Disp: 90 tablet, Rfl: 1   tadalafil (CIALIS) 10 MG tablet, Take 1 tablet (10 mg total) by mouth daily as needed for erectile dysfunction., Disp: 90 tablet, Rfl: 3  Allergies  Allergen Reactions   Penicillins Nausea And Vomiting and Other (See Comments)    REACTION: Vomiting Has patient had a PCN reaction causing immediate rash, facial/tongue/throat swelling, SOB or lightheadedness with hypotension: Yes Has patient had a PCN reaction causing severe rash involving mucus membranes or skin necrosis: No Has patient had a PCN reaction that required hospitalization No Has patient had a PCN reaction occurring within the last 10 years: No If all of the above answers are "NO", then may proceed with Cephalosporin use.   Amlodipine Swelling   Bupropion Hcl     REACTION: Severe constipation, insomnia   Chlorthalidone  Other (See Comments)    Other reaction(s): Hyponatremia   Metformin Diarrhea   Nintedanib Diarrhea and Other (See Comments)   Terazosin Other (See Comments)   Review of Systems Objective:  There were no vitals filed for this visit.  General: Well developed, nourished, in no acute distress, alert and oriented x3   Dermatological: Skin is warm, dry and supple bilateral. Nails x 10 are well maintained; remaining integument appears unremarkable at this time. There are no open sores, no preulcerative lesions, no rash or signs of infection present.  Sharply abraded nail margin along the fibular border with mild erythema hallux left.  Has a near complete distal onycholysis of the fourth nail plate left secondary to the deformity of the distal aspect of the toe against the third toe.  This is painful with mild erythema proximal nail fold.  No purulence either site.  Vascular: Dorsalis Pedis artery and Posterior Tibial artery pedal pulses are 2/4 bilateral with immedate capillary fill time. Pedal hair growth present. No varicosities and no lower extremity edema present bilateral.   Neruologic: Grossly intact via light touch bilateral. Vibratory intact via tuning fork bilateral. Protective threshold with Semmes Wienstein monofilament intact to all pedal sites bilateral. Patellar and Achilles deep tendon reflexes 2+ bilateral. No Babinski or clonus noted bilateral.   Musculoskeletal: No gross boney pedal deformities bilateral. No pain, crepitus, or limitation noted with foot and ankle range of motion bilateral. Muscular strength 5/5 in all groups tested bilateral.  Gait: Unassisted, Nonantalgic.    Radiographs:  None taken  Assessment & Plan:   Assessment: Ingrown nail fibular border hallux left deformed nail fourth digit left foot.  Plan: Chemical matricectomy was performed along the fibular border with phenol.  Total nail avulsion with phenol and matrixectomy to the fourth toe left foot.  He  tolerated procedure well after local anesthetic was administered to these 2 toes.  He was given both oral and written home-going structure of the care and soaking the toe as well as a prescription for Cortisporin Otic to be applied to these toes twice daily after soaking.  He will cover them with Band-Aids follow-up with me in 2 to 3 weeks.     Imoni Kohen T. [place], [place]

## 2023-04-19 NOTE — Patient Instructions (Signed)

## 2023-04-27 ENCOUNTER — Other Ambulatory Visit: Payer: Self-pay

## 2023-05-03 ENCOUNTER — Ambulatory Visit (INDEPENDENT_AMBULATORY_CARE_PROVIDER_SITE_OTHER): Payer: PPO | Admitting: Podiatry

## 2023-05-03 ENCOUNTER — Encounter: Payer: Self-pay | Admitting: Podiatry

## 2023-05-03 DIAGNOSIS — L600 Ingrowing nail: Secondary | ICD-10-CM

## 2023-05-03 DIAGNOSIS — Z9889 Other specified postprocedural states: Secondary | ICD-10-CM

## 2023-05-03 NOTE — Progress Notes (Signed)
 He presents today for follow-up of his nail check fibular border of the hallux left as well as the fourth toe on the left foot for total nail avulsion was performed with a matrixectomy.  He states that everything is going good his fourth toe appears to be black on top.  He is also complaining of a small area of this painful at the distal aspect of the medial border of the hallux left.  Continues to soak in Betadine and warm water twice daily and applies Cortisporin otic.  Objective: Vitals are stable he is alert oriented x 3.  There is no erythema edema cellulitis drainage or odor eschars present to the nailbed of the fourth digit left foot Sharp incurvated nail margin to the fibular border of the hallux left was resected and is doing well no granulation tissue no signs of infection just epithelialization occurring.  He does have some mild erythema and tenderness on the tip of the medial nail plate for is slightly incurvated.  There is no signs of infection.  Assessment: Mild ingrown nail tibial border hallux left.  Resolving matrixectomy's fibular border hallux left and fourth toe left foot.  Plan: Discussed etiology pathology and surgical therapies recommend he discontinue the Betadine and warm water but start with Epsom salts and warm water every other day covering during the day leave open at night and I did trim that tibial margin.  Follow-up with him in 1 month for fourth toe check.

## 2023-05-04 ENCOUNTER — Other Ambulatory Visit: Payer: Self-pay

## 2023-05-05 ENCOUNTER — Other Ambulatory Visit (HOSPITAL_COMMUNITY): Payer: Self-pay

## 2023-05-07 ENCOUNTER — Other Ambulatory Visit (HOSPITAL_COMMUNITY): Payer: Self-pay

## 2023-05-10 ENCOUNTER — Other Ambulatory Visit (HOSPITAL_COMMUNITY): Payer: Self-pay

## 2023-05-10 ENCOUNTER — Other Ambulatory Visit: Payer: Self-pay

## 2023-05-13 ENCOUNTER — Encounter (HOSPITAL_COMMUNITY): Payer: Self-pay

## 2023-05-13 ENCOUNTER — Other Ambulatory Visit (HOSPITAL_COMMUNITY): Payer: Self-pay

## 2023-05-14 ENCOUNTER — Other Ambulatory Visit: Payer: Self-pay

## 2023-05-14 ENCOUNTER — Other Ambulatory Visit (HOSPITAL_COMMUNITY): Payer: Self-pay

## 2023-05-14 MED ORDER — OMEPRAZOLE 20 MG PO CPDR
20.0000 mg | DELAYED_RELEASE_CAPSULE | Freq: Every day | ORAL | 3 refills | Status: DC
  Filled 2023-05-14: qty 90, 90d supply, fill #0
  Filled 2023-10-06: qty 90, 90d supply, fill #1

## 2023-05-17 ENCOUNTER — Other Ambulatory Visit (HOSPITAL_COMMUNITY): Payer: Self-pay

## 2023-05-17 ENCOUNTER — Encounter: Payer: Self-pay | Admitting: Internal Medicine

## 2023-05-17 ENCOUNTER — Encounter (HOSPITAL_COMMUNITY): Payer: Self-pay

## 2023-05-17 ENCOUNTER — Telehealth: Payer: Self-pay | Admitting: Internal Medicine

## 2023-05-17 NOTE — Telephone Encounter (Signed)
 I called and spoke with the pt and notified of response from MR  He verbalized understanding  Nothing further needed

## 2023-05-17 NOTE — Telephone Encounter (Signed)
 Patient has a consistent dry cough and would like to know if he should take prednisone. He currently has some and wants to know how much and how often tot take it.

## 2023-05-17 NOTE — Telephone Encounter (Signed)
 Patient has also sent a mychart message. Patient is complaining with a dry,chronic cough that started in January 2025.  Patient stated he had something similar in 2023 and was prescribed Prednisone.  Patient stated he has plenty of Prednisone and has started taking the Prednisone today. Patient wanted to update Dr. Marchelle Gearing and make sure taking a Prednisone taper is ok.  This is past recommendations patient is following at this time.  - Take prednisone 40 mg daily x 2 days, then prednisone 30mg  daily x 2 days, then 20mg  daily x 2 days, then 10mg  continue prednisone 10 mg/ day per daily   Message routed to Dr. Marchelle Gearing to advise

## 2023-05-17 NOTE — Telephone Encounter (Signed)
 Ths is fine

## 2023-05-19 NOTE — Telephone Encounter (Signed)
 I believe I already addressed this.  Please double check?

## 2023-05-21 NOTE — Telephone Encounter (Signed)
 Addressed in 05/17/23 telephone encounter. Nothing further needed at this time.

## 2023-05-24 NOTE — Progress Notes (Unsigned)
 Cardiology Office Note  Date:  05/25/2023   ID:  Brian Casey, DOB 05-01-1950, MRN [id number]  PCP:  Jerl Mina, MD   Chief Complaint  Patient presents with   12 month follow up     Patient c/o a cough with exertion for the past couple of weeks with a burning sensation in the back of his throat.     HPI:  Brian Casey is a 73 y.o. male with history of  CAD s/p 4-vessel CABG, 2017 s/p LICA endarerectomy 11/14/15, PAF  on full-dose anticoagulation,  CKD stage II,  DM2,   HTN,  HLD,  hypothyroidism,  tobacco abuse for 48 years,  Nov 01, 2015 chronic alcohol abuse  ILD, followed by pulmonary Normal right heart catheterization November 2023 presents to clinic for follow up of his CAD and paroxysmal atrial fibrillation   Last seen by myself in clinic 2/24  Was weaning prednisone down to every other day Then developed sore throat, worsening cough Restarted prednisone daily, sx persisted Took extra prednisone, Cleared it up, came back when prednisone stopped On exertion has cough, Followed by Pulmonary in [place]  High-resolution CT chest January 2025 Aortic atherosclerosis Widespread patchy areas of groundglass attenuation and septal thickening subpleural reticulation cylindrical bronchiectasis honeycombing etc., ILD  Denies chest pain concerning for angina  LABs:  A1C 7.7 down from 8.5, higher on prednisone No recent lipid panel Creatinine 1.1 BUN 16 Normal LFTs  right heart catheterization which was performed February 03, 2022 Details showing normal left and right heart filling pressures Normal PA pressure  Followed at the [facility] over the past several years  Followed by pulmonary Pulmonary history, 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 Progression of interstitial lung disease. Pattern suggests nonspecific interstitial pneumonia or idiopathic interstitial pneumonia. 2. Coronary artery calcifications and aortic  atherosclerosis,stable.  [facility] 60-79% on [facility] right  Wore a monitor at the [place], no atrial fib Was taken off eliquis by the [place]  EKG personally reviewed by myself on todays visit Normal sinus rhythm rate 67 bpm no significant ST-T wave changes  Other past medical history reviewed  presented to [facility] on 11/04/15 with a 3-day history of RUQ pain  CT abdomen/pelvis that showed acute acalculus cholecystitis.  noted to be in new onset Afib with RVR His troponin peaked at 0.11. His Afib was rate controlled and ultimately converted to sinus rhythm on IV amiodarone.   Echo on 8/14 showed an EF of 50-55%, no RWMA, GR2DD, PASP normal.  He underwent Lexiscan Myoview on 11/06/15 that was high-risk with moderate sized region of moderate intensity in the lateral wall, EF 68%.  LHC showed severe 4-vessel CAD.   transferred to [facility] for cardiac bypass surgery.  He underwent successful 4-vessel CABG on 11/14/2015 with LIMA to LAD, SVG to Diag, SVG to RI, SVG to PDA.    found to have severe LICA stenosis at 80-99% and underwent successful left carotid endarterectomy on 8/24. He had 60-79% RICA stenosis.   gallbladder drain placed  PMH:   has a past medical history of Adenomatous colon polyp, Aortic root dilatation (HCC), Arthritis, CAD (coronary artery disease), Chronic respiratory failure with hypoxia (HCC), CKD (chronic kidney disease), stage III (HCC), Diastolic dysfunction, Diverticulosis, GERD (gastroesophageal reflux disease), Grade I diastolic dysfunction, High cholesterol, History of bronchitis, Hypertension (10/17/2013), Hypothyroidism, ILD (interstitial lung disease) (HCC), Mild pulmonary hypertension (HCC), PAD (peripheral artery disease) (HCC), Post-op atrial fibrillation (HCC) (  11/06/2015), Shortness of breath, Type II diabetes mellitus (HCC), and Wears glasses.  PSH:    Past Surgical History:  Procedure Laterality Date   CARDIAC CATHETERIZATION Right  11/06/2015   Procedure: Left Heart Cath and Coronary Angiography;  Surgeon: Antonieta Iba, MD;  Location: [facility] INVASIVE CV LAB;  Service: Cardiovascular;  Laterality: Right;   CATARACT EXTRACTION W/PHACO Right 09/09/2022   Procedure: CATARACT EXTRACTION PHACO AND INTRAOCULAR LENS PLACEMENT (IOC) RIGHT DIABETIC OMIDRIA 11.17 01:06.5;  Surgeon: Estanislado Pandy, MD;  Location: [facility]ERY [facility];  Service: Ophthalmology;  Laterality: Right;   CATARACT EXTRACTION W/PHACO Left 10/08/2022   Procedure: CATARACT EXTRACTION PHACO AND INTRAOCULAR LENS PLACEMENT (IOC) LEFT DIABETIC OMIDRIA 12.90 01:11.8;  Surgeon: Estanislado Pandy, MD;  Location: [facility];  Service: Ophthalmology;  Laterality: Left;   CHOLECYSTECTOMY N/A 01/17/2016   Procedure: LAPAROSCOPIC CHOLECYSTECTOMY WITH INTRAOPERATIVE CHOLANGIOGRAM;  Surgeon: Claud Kelp, MD;  Location: [facility] OR;  Service: General;  Laterality: N/A;   COLONOSCOPY     COLONOSCOPY N/A 11/19/2021   Procedure: COLONOSCOPY;  Surgeon: Toledo, Boykin Nearing, MD;  Location: [facility] ENDOSCOPY;  Service: Gastroenterology;  Laterality: N/A;   CORONARY ARTERY BYPASS GRAFT N/A 11/14/2015   Procedure: CORONARY ARTERY BYPASS GRAFTING (CABG) x 4;  Surgeon: Kerin Perna, MD;  Location: [facility] OR;  Service: Open Heart Surgery;  Laterality: N/A;   ENDARTERECTOMY Left 11/14/2015   Procedure: LEFT ENDARTERECTOMY CAROTID;  Surgeon: Maeola Harman, MD;  Location: [facility] OR;  Service: Vascular;  Laterality: Left;   ESOPHAGOGASTRODUODENOSCOPY N/A 11/19/2021   Procedure: ESOPHAGOGASTRODUODENOSCOPY (EGD);  Surgeon: Toledo, Boykin Nearing, MD;  Location: [facility] ENDOSCOPY;  Service: Gastroenterology;  Laterality: N/A;   EXCISION OF MESH N/A 06/18/2022   Procedure: EXCISION OF MESH;  Surgeon: Manus Rudd, MD;  Location: [facility] OR;  Service: General;  Laterality: N/A;   FINGER GANGLION CYST EXCISION Left X 3   "index finger X 2; thumb X 1"   HEMORRHOID SURGERY  1982   HERNIA REPAIR      INSERTION OF MESH N/A 11/16/2013   Procedure: INSERTION OF MESH;  Surgeon: Wilmon Arms. Corliss Skains, MD;  Location: [facility] OR;  Service: General;  Laterality: N/A;   IR GENERIC HISTORICAL  11/08/2015   IR PERC CHOLECYSTOSTOMY 11/08/2015 Gilmer Mor, DO [facility]   IR GENERIC HISTORICAL  12/13/2015   IR CHOLANGIOGRAM EXISTING TUBE 12/13/2015 [facility]-INTER[facility]   IR GENERIC HISTORICAL  12/25/2015   IR CATHETER TUBE CHANGE 12/25/2015 Simonne Come, MD [facility]   IR GENERIC HISTORICAL  12/12/2015   IR RADIOLOGIST EVAL & MGMT 12/12/2015 Berdine Dance, MD [facility]   RIGHT HEART CATH Right 02/03/2022   Procedure: RIGHT HEART CATH;  Surgeon: Yvonne Kendall, MD;  Location: [facility] INVASIVE CV LAB;  Service: Cardiovascular;  Laterality: Right;   TEE WITHOUT CARDIOVERSION N/A 11/14/2015   Procedure: TRANSESOPHAGEAL ECHOCARDIOGRAM (TEE);  Surgeon: Kerin Perna, MD;  Location: [facility] OR;  Service: Open Heart Surgery;  Laterality: N/A;   UMBILICAL HERNIA REPAIR N/A 11/16/2013   Procedure: UMBILICAL HERNIA REPAIR;  Surgeon: Wilmon Arms. Corliss Skains, MD;  Location: [facility] OR;  Service: General;  Laterality: N/A;   UMBILICAL HERNIA REPAIR N/A 06/18/2022   Procedure: OPEN UMBILICAL HERNIA REPAIR;  Surgeon: Manus Rudd, MD;  Location: [facility] OR;  Service: General;  Laterality: N/A;   VEIN HARVEST Right 11/14/2015   Procedure: RIGHT LEG GREATER SAPHENOUS VEIN HARVEST;  Surgeon: Kerin Perna, MD;  Location: [facility] OR;  Service: Open Heart Surgery;  Laterality: Right;  Current Outpatient Medications  Medication Sig Dispense Refill   aspirin EC 81 MG tablet Take 81 mg by mouth daily. Swallow whole.     atorvastatin (LIPITOR) 40 MG tablet Take 1 tablet (40 mg total) by mouth daily. 90 tablet 3   carvedilol (COREG) 12.5 MG tablet Take 1 tablet (12.5 mg total) by mouth 2 (two) times daily with a meal. 180 tablet 3   empagliflozin (JARDIANCE) 10 MG TABS tablet Take 25 mg by mouth daily.     furosemide (LASIX) 20 MG tablet Take 1 tablet  (20 mg total) by mouth daily. 90 tablet 3   glucose blood test strip Use to test blood sugar twice a day 200 each 3   levothyroxine (SYNTHROID) 100 MCG tablet Take 1 tablet (100 mcg total) by mouth, on an empty stomach, daily with a glass of water at least 30-60 minutes before breakfast. 90 tablet 3   loperamide (IMODIUM) 2 MG capsule Take 2 mg by mouth as needed for diarrhea or loose stools.     NEOMYCIN-POLYMYXIN-HYDROCORTISONE (CORTISPORIN) 1 % SOLN OTIC solution Apply 1-2 drops to toe BID after soaking 10 mL 1   omeprazole (PRILOSEC) 20 MG capsule Take 1 capsule (20 mg total) by mouth daily. 90 capsule 3   ondansetron (ZOFRAN) 4 MG tablet Take 1 tablet (4 mg total) by mouth every 8 (eight) hours as needed for nausea and/or vomitting. 20 tablet 3   OXYGEN Inhale 4 L into the lungs daily.     Pirfenidone 801 MG TABS Take 1 tablet (801 mg total) by mouth in the morning and at bedtime. **low dose as maintenance** 180 tablet 1   PRECISION QID TEST test strip Use 2 (two) times daily     predniSONE (DELTASONE) 10 MG tablet Take 1 tablet (10 mg total) by mouth daily with breakfast. 90 tablet 1   tadalafil (CIALIS) 10 MG tablet Take 1 tablet (10 mg total) by mouth daily as needed for erectile dysfunction. 90 tablet 3   No current facility-administered medications for this visit.    Allergies:   Penicillins, Amlodipine, Bupropion hcl, Chlorthalidone, Metformin, Nintedanib, and Terazosin   Social History:  The patient  reports that he quit smoking about 7 years ago. His smoking use included cigarettes. He started smoking about 55 years ago. He has a 48 pack-year smoking history. He has never used smokeless tobacco. He reports current alcohol use of about 3.0 standard drinks of alcohol per week. He reports that he does not use drugs.   Family History:   family history includes Colon cancer in his mother; Depression in his mother; Diabetes in his maternal grandmother and mother; Hypertension in his father  and mother; Hyperthyroidism in his father; Lung cancer in his maternal grandmother; Prostate cancer in his father.   Review of Systems: Review of Systems  Constitutional: Negative.   Respiratory: Negative.    Cardiovascular: Negative.   Gastrointestinal: Negative.   Musculoskeletal: Negative.   Neurological: Negative.   Psychiatric/Behavioral: Negative.    All other systems reviewed and are negative.   PHYSICAL EXAM: VS:  BP (!) 110/50 (BP Location: Left Arm, Patient Position: Sitting, Cuff Size: Normal)   Pulse 64   Ht 5\' 7"  (1.702 m)   Wt 161 lb 8 oz (73.3 kg)   SpO2 92%   BMI 25.29 kg/m  , BMI Body mass index is 25.29 kg/m. Constitutional:  oriented to person, place, and time. No distress.  HENT:  Head: Grossly normal Eyes:  no discharge.  No scleral icterus.  Neck: No JVD, no carotid bruits  Cardiovascular: Regular rate and rhythm, no murmurs appreciated Pulmonary/Chest: Coarse breath sounds bilaterally Abdominal: Soft.  no distension.  no tenderness.  Musculoskeletal: Normal range of motion Neurological:  normal muscle tone. Coordination normal. No atrophy Skin: Skin warm and dry Psychiatric: normal affect, pleasant   Recent Labs: 06/11/2022: BUN 17; Creatinine, Ser 1.40; Hemoglobin 15.3; Platelets 203; Potassium 4.3; Sodium 131 07/21/2022: ALT 21    Lipid Panel Lab Results  Component Value Date   CHOL 150 10/30/2014   HDL 42.30 10/30/2014   LDLCALC 88 10/30/2014   TRIG 437 (H) 11/09/2015      Wt Readings from Last 3 Encounters:  05/25/23 161 lb 8 oz (73.3 kg)  01/12/23 162 lb 9.6 oz (73.8 kg)  10/08/22 170 lb 12.8 oz (77.5 kg)     ASSESSMENT AND PLAN:  Coronary artery disease with stable angina Currently with no symptoms of angina. No further workup at this time. Continue current medication regimen.  Chronic respiratory distress/ILD Followed by pulmonary, oxygen as needed, chronic steroids Normal right heart pressures Pulsed prednisone for sore  throat, cough on exertion, symptoms have persisted He will get back in touch with pulmonary, may need longer prednisone taper  PAF (paroxysmal atrial fibrillation) (HCC) CHADs VASC of 5  Had a monitor at the [place], was told he did not need to be on Eliquis and this was held by outside cardiology Denies symptoms concerning for arrhythmia  Aortic atherosclerosis (HCC) Lipid panel ordered today, stay on statin  Stenosis of left carotid artery History of carotid endarterectomy Scheduled for repeat ultrasound March 2026  type 2 diabetes mellitus with other circulatory complication, without long-term current use of insulin (HCC) A1c 7.7, typically runs high from chronic prednisone  S/P CABG x 4 Denies anginal symptoms, prior surgery 2017  PAD History of carotid endarterectomy Stressed importance of aggressive diabetes control  Alcohol abuse Prior history   Orders Placed This Encounter  Procedures   EKG 12-Lead     Signed, Dossie Arbour, M.D., Ph.D. 05/25/2023  [facility] [place], [place] [phone]

## 2023-05-25 ENCOUNTER — Encounter: Payer: Self-pay | Admitting: Cardiovascular Disease

## 2023-05-25 ENCOUNTER — Ambulatory Visit: Payer: PPO | Attending: Cardiovascular Disease | Admitting: Cardiovascular Disease

## 2023-05-25 VITALS — BP 110/50 | HR 64 | Ht 67.0 in | Wt 161.5 lb

## 2023-05-25 DIAGNOSIS — Z79899 Other long term (current) drug therapy: Secondary | ICD-10-CM

## 2023-05-25 DIAGNOSIS — J989 Respiratory disorder, unspecified: Secondary | ICD-10-CM

## 2023-05-25 DIAGNOSIS — I700 Atherosclerosis of aorta: Secondary | ICD-10-CM | POA: Diagnosis not present

## 2023-05-25 DIAGNOSIS — I4891 Unspecified atrial fibrillation: Secondary | ICD-10-CM

## 2023-05-25 DIAGNOSIS — I480 Paroxysmal atrial fibrillation: Secondary | ICD-10-CM

## 2023-05-25 DIAGNOSIS — R0602 Shortness of breath: Secondary | ICD-10-CM

## 2023-05-25 DIAGNOSIS — I739 Peripheral vascular disease, unspecified: Secondary | ICD-10-CM

## 2023-05-25 DIAGNOSIS — Z9889 Other specified postprocedural states: Secondary | ICD-10-CM

## 2023-05-25 DIAGNOSIS — J849 Interstitial pulmonary disease, unspecified: Secondary | ICD-10-CM

## 2023-05-25 DIAGNOSIS — E782 Mixed hyperlipidemia: Secondary | ICD-10-CM

## 2023-05-25 DIAGNOSIS — I25118 Atherosclerotic heart disease of native coronary artery with other forms of angina pectoris: Secondary | ICD-10-CM | POA: Diagnosis not present

## 2023-05-25 NOTE — Patient Instructions (Addendum)
 Try zyrtec daily and generic flonase nasal spray daily   Medication Instructions:  No changes  If you need a refill on your cardiac medications before your next appointment, please call your pharmacy.   Lab work: Lipid panel   Testing/Procedures: No new testing needed  Follow-Up: At [facility], you and your health needs are our priority.  As part of our continuing mission to provide you with exceptional heart care, we have created designated Provider Care Teams.  These Care Teams include your primary Cardiologist (physician) and Advanced Practice Providers (APPs -  Physician Assistants and Nurse Practitioners) who all work together to provide you with the care you need, when you need it.  You will need a follow up appointment in 12 months  Providers on your designated Care Team:   Nicolasa Ducking, NP Eula Listen, PA-C Cadence Fransico Michael, [place]  COVID-19 Vaccine Information can be found at: [email or web address] For questions related to vaccine distribution or appointments, please email [email or web address] or call [phone].

## 2023-05-26 LAB — LIPID PANEL
Chol/HDL Ratio: 2.9 ratio (ref 0.0–5.0)
Cholesterol, Total: 171 mg/dL (ref 100–199)
HDL: 59 mg/dL (ref >39)
LDL Chol Calc (NIH): 88 mg/dL (ref 0–99)
Triglycerides: 138 mg/dL (ref 0–149)
VLDL Cholesterol Cal: 24 mg/dL (ref 5–40)

## 2023-05-28 ENCOUNTER — Encounter: Payer: Self-pay | Admitting: Cardiovascular Disease

## 2023-05-28 ENCOUNTER — Other Ambulatory Visit (HOSPITAL_COMMUNITY): Payer: Self-pay

## 2023-05-28 ENCOUNTER — Other Ambulatory Visit: Payer: Self-pay | Admitting: Emergency Medicine

## 2023-05-28 MED ORDER — EZETIMIBE 10 MG PO TABS
10.0000 mg | ORAL_TABLET | Freq: Every day | ORAL | 3 refills | Status: AC
Start: 2023-05-28 — End: 2024-05-25
  Filled 2023-05-28: qty 90, 90d supply, fill #0
  Filled 2023-08-10: qty 90, 90d supply, fill #1
  Filled 2023-11-21: qty 90, 90d supply, fill #2
  Filled 2024-02-11: qty 90, 90d supply, fill #3

## 2023-05-31 ENCOUNTER — Encounter: Payer: Self-pay | Admitting: Podiatry

## 2023-05-31 ENCOUNTER — Ambulatory Visit: Payer: PPO | Admitting: Podiatry

## 2023-05-31 ENCOUNTER — Encounter: Payer: No Typology Code available for payment source | Attending: Internal Medicine

## 2023-05-31 ENCOUNTER — Other Ambulatory Visit: Payer: Self-pay

## 2023-05-31 DIAGNOSIS — L03039 Cellulitis of unspecified toe: Secondary | ICD-10-CM | POA: Diagnosis not present

## 2023-05-31 DIAGNOSIS — J849 Interstitial pulmonary disease, unspecified: Secondary | ICD-10-CM | POA: Insufficient documentation

## 2023-05-31 NOTE — Progress Notes (Signed)
Virtual Visit completed. Patient informed on EP and RD appointment and 6 Minute walk test. Patient also informed of patient health questionnaires on My Chart. Patient Verbalizes understanding. Visit diagnosis can be found in [facility] media,[facility]  

## 2023-06-01 NOTE — Progress Notes (Signed)
 Presents today for a wound check fourth digit left foot.  States that has not changed that much still has a black spot on it.  Objective: Fourth digit left foot does demonstrate eschar overlying the nailbed.  With scabs from the chemical burn of the phenol present.  I removed these today and removed half of the eschar with sterile manipulation.  He tolerated procedure well  Assessment: Healing fourth toe permanent matrixectomy total.  Plan: Follow-up on a as needed basis.  The remainder of the eschar should continue to grow off.

## 2023-06-02 VITALS — Ht 68.0 in | Wt 158.4 lb

## 2023-06-02 DIAGNOSIS — J849 Interstitial pulmonary disease, unspecified: Secondary | ICD-10-CM

## 2023-06-02 NOTE — Patient Instructions (Signed)
 Patient Instructions  Patient Details  Name: Brian Casey MRN: [id number] Date of Birth: [birth year] Referring Provider:  Pernell Dupre, MD  Below are your personal goals for exercise, nutrition, and risk factors. Our goal is to help you stay on track towards obtaining and maintaining these goals. We will be discussing your progress on these goals with you throughout the program.  Initial Exercise Prescription:  Initial Exercise Prescription - 06/02/23 1500       Date of Initial Exercise RX and Referring Provider   Date 06/02/23    Referring Provider Dr. Pernell Dupre      Oxygen   Oxygen Continuous    Liters 4    Maintain Oxygen Saturation 88% or higher      Recumbant Bike   Level 1    RPM 50    Watts 25    Minutes 15    METs 2.26      NuStep   Level 1    SPM 80    Minutes 15    METs 2.26      Arm Ergometer   Level 1    RPM 30    Minutes 15    METs 2.26      T5 Nustep   Level 1    SPM 80    Minutes 15    METs 2.26      Track   Laps 23    Minutes 15    METs 2.25      Intensity   THRR 40-80% of Max Heartrate 103-133    Ratings of Perceived Exertion 11-13    Perceived Dyspnea 0-4      Resistance Training   Training Prescription Yes    Weight 4 lb    Reps 10-15             Exercise Goals: Frequency: Be able to perform aerobic exercise two to three times per week in program working toward 2-5 days per week of home exercise.  Intensity: Work with a perceived exertion of 11 (fairly light) - 15 (hard) while following your exercise prescription.  We will make changes to your prescription with you as you progress through the program.   Duration: Be able to do 30 to 45 minutes of continuous aerobic exercise in addition to a 5 minute warm-up and a 5 minute cool-down routine.   Nutrition Goals: Your personal nutrition goals will be established when you do your nutrition analysis with the dietician.  The following are general nutrition guidelines  to follow: Cholesterol < 200mg /day Sodium < 1500mg /day Fiber: Men over 50 yrs - 30 grams per day  Personal Goals:  Personal Goals and Risk Factors at Admission - 05/31/23 0940       Core Components/Risk Factors/Patient Goals on Admission    Weight Management Yes;Weight Maintenance    Intervention Weight Management: Develop a combined nutrition and exercise program designed to reach desired caloric intake, while maintaining appropriate intake of nutrient and fiber, sodium and fats, and appropriate energy expenditure required for the weight goal.;Weight Management: Provide education and appropriate resources to help participant work on and attain dietary goals.;Weight Management/Obesity: Establish reasonable short term and long term weight goals.    Expected Outcomes Short Term: Continue to assess and modify interventions until short term weight is achieved;Understanding recommendations for meals to include 15-35% energy as protein, 25-35% energy from fat, 35-60% energy from carbohydrates, less than 200mg  of dietary cholesterol, 20-35 gm of total fiber daily;Understanding of distribution of calorie intake  throughout the day with the consumption of 4-5 meals/snacks;Long Term: Adherence to nutrition and physical activity/exercise program aimed toward attainment of established weight goal;Weight Maintenance: Understanding of the daily nutrition guidelines, which includes 25-35% calories from fat, 7% or less cal from saturated fats, less than 200mg  cholesterol, less than 1.5gm of sodium, & 5 or more servings of fruits and vegetables daily    Improve shortness of breath with ADL's Yes    Intervention Provide education, individualized exercise plan and daily activity instruction to help decrease symptoms of SOB with activities of daily living.    Expected Outcomes Short Term: Improve cardiorespiratory fitness to achieve a reduction of symptoms when performing ADLs    Diabetes Yes    Intervention Provide  education about signs/symptoms and action to take for hypo/hyperglycemia.;Provide education about proper nutrition, including hydration, and aerobic/resistive exercise prescription along with prescribed medications to achieve blood glucose in normal ranges: Fasting glucose 65-99 mg/dL    Expected Outcomes Short Term: Participant verbalizes understanding of the signs/symptoms and immediate care of hyper/hypoglycemia, proper foot care and importance of medication, aerobic/resistive exercise and nutrition plan for blood glucose control.;Long Term: Attainment of HbA1C < 7%.    Hypertension Yes    Intervention Provide education on lifestyle modifcations including regular physical activity/exercise, weight management, moderate sodium restriction and increased consumption of fresh fruit, vegetables, and low fat dairy, alcohol moderation, and smoking cessation.;Monitor prescription use compliance.    Expected Outcomes Short Term: Continued assessment and intervention until BP is < 140/79mm HG in hypertensive participants. < 130/39mm HG in hypertensive participants with diabetes, heart failure or chronic kidney disease.;Long Term: Maintenance of blood pressure at goal levels.    Lipids Yes    Intervention Provide education and support for participant on nutrition & aerobic/resistive exercise along with prescribed medications to achieve LDL 70mg , HDL >40mg .    Expected Outcomes Short Term: Participant states understanding of desired cholesterol values and is compliant with medications prescribed. Participant is following exercise prescription and nutrition guidelines.;Long Term: Cholesterol controlled with medications as prescribed, with individualized exercise RX and with personalized nutrition plan. Value goals: LDL < 70mg , HDL > 40 mg.             Exercise Goals and Review:  Exercise Goals     Row Name 06/02/23 1550             Exercise Goals   Increase Physical Activity Yes       Intervention  Provide advice, education, support and counseling about physical activity/exercise needs.;Develop an individualized exercise prescription for aerobic and resistive training based on initial evaluation findings, risk stratification, comorbidities and participant's personal goals.       Expected Outcomes Short Term: Attend rehab on a regular basis to increase amount of physical activity.;Long Term: Add in home exercise to make exercise part of routine and to increase amount of physical activity.;Long Term: Exercising regularly at least 3-5 days a week.       Increase Strength and Stamina Yes       Intervention Provide advice, education, support and counseling about physical activity/exercise needs.;Develop an individualized exercise prescription for aerobic and resistive training based on initial evaluation findings, risk stratification, comorbidities and participant's personal goals.       Expected Outcomes Short Term: Increase workloads from initial exercise prescription for resistance, speed, and METs.;Short Term: Perform resistance training exercises routinely during rehab and add in resistance training at home;Long Term: Improve cardiorespiratory fitness, muscular endurance and strength as measured by increased  METs and functional capacity ( )       Able to understand and use rate of perceived exertion (RPE) scale Yes       Intervention Provide education and explanation on how to use RPE scale       Expected Outcomes Short Term: Able to use RPE daily in rehab to express subjective intensity level       Able to understand and use Dyspnea scale Yes       Intervention Provide education and explanation on how to use Dyspnea scale       Expected Outcomes Short Term: Able to use Dyspnea scale daily in rehab to express subjective sense of shortness of breath during exertion;Long Term: Able to use Dyspnea scale to guide intensity level when exercising independently       Knowledge and understanding of  Target Heart Rate Range (THRR) Yes       Intervention Provide education and explanation of THRR including how the numbers were predicted and where they are located for reference       Expected Outcomes Short Term: Able to state/look up THRR;Short Term: Able to use daily as guideline for intensity in rehab;Long Term: Able to use THRR to govern intensity when exercising independently       Able to check pulse independently Yes       Intervention Provide education and demonstration on how to check pulse in carotid and radial arteries.;Review the importance of being able to check your own pulse for safety during independent exercise       Expected Outcomes Long Term: Able to check pulse independently and accurately;Short Term: Able to explain why pulse checking is important during independent exercise       Understanding of Exercise Prescription Yes       Intervention Provide education, explanation, and written materials on patient's individual exercise prescription       Expected Outcomes Short Term: Able to explain program exercise prescription;Long Term: Able to explain home exercise prescription to exercise independently                Copy of goals given to participant.

## 2023-06-02 NOTE — Progress Notes (Signed)
 Pulmonary Individual Treatment Plan  Patient Details  Name: PAXSON HARROWER MRN: [id number] Date of Birth: 1950/10/16 Referring Provider:   Flowsheet Row Pulmonary Rehab from 06/02/2023 in [facility] Cardiac and Pulmonary Rehab  Referring Provider Dr. Pernell Dupre       Initial Encounter Date:  Flowsheet Row Pulmonary Rehab from 06/02/2023 in [facility] Cardiac and Pulmonary Rehab  Date 06/02/23       Visit Diagnosis: ILD (interstitial lung disease) (HCC)  Patient's Home Medications on Admission:  Current Outpatient Medications:    aspirin EC 81 MG tablet, Take 81 mg by mouth daily. Swallow whole., Disp: , Rfl:    atorvastatin (LIPITOR) 40 MG tablet, Take 1 tablet (40 mg total) by mouth daily., Disp: 90 tablet, Rfl: 3   carvedilol (COREG) 12.5 MG tablet, Take 1 tablet (12.5 mg total) by mouth 2 (two) times daily with a meal., Disp: 180 tablet, Rfl: 3   empagliflozin (JARDIANCE) 10 MG TABS tablet, Take 25 mg by mouth daily., Disp: , Rfl:    ezetimibe (ZETIA) 10 MG tablet, Take 1 tablet (10 mg total) by mouth daily., Disp: 90 tablet, Rfl: 3   furosemide (LASIX) 20 MG tablet, Take 1 tablet (20 mg total) by mouth daily., Disp: 90 tablet, Rfl: 3   glucose blood test strip, Use to test blood sugar twice a day, Disp: 200 each, Rfl: 3   levothyroxine (SYNTHROID) 100 MCG tablet, Take 1 tablet (100 mcg total) by mouth, on an empty stomach, daily with a glass of water at least 30-60 minutes before breakfast., Disp: 90 tablet, Rfl: 3   loperamide (IMODIUM) 2 MG capsule, Take 2 mg by mouth as needed for diarrhea or loose stools. (Patient not taking: Reported on 05/31/2023), Disp: , Rfl:    NEOMYCIN-POLYMYXIN-HYDROCORTISONE (CORTISPORIN) 1 % SOLN OTIC solution, Apply 1-2 drops to toe BID after soaking, Disp: 10 mL, Rfl: 1   omeprazole (PRILOSEC) 20 MG capsule, Take 1 capsule (20 mg total) by mouth daily., Disp: 90 capsule, Rfl: 3   ondansetron (ZOFRAN) 4 MG tablet, Take 1 tablet (4 mg total) by mouth every 8  (eight) hours as needed for nausea and/or vomitting. (Patient not taking: Reported on 05/31/2023), Disp: 20 tablet, Rfl: 3   OXYGEN, Inhale 4 L into the lungs daily., Disp: , Rfl:    Pirfenidone 801 MG TABS, Take 1 tablet (801 mg total) by mouth in the morning and at bedtime. **low dose as maintenance**, Disp: 180 tablet, Rfl: 1   PRECISION QID TEST test strip, Use 2 (two) times daily, Disp: , Rfl:    predniSONE (DELTASONE) 10 MG tablet, Take 1 tablet (10 mg total) by mouth daily with breakfast., Disp: 90 tablet, Rfl: 1   tadalafil (CIALIS) 10 MG tablet, Take 1 tablet (10 mg total) by mouth daily as needed for erectile dysfunction., Disp: 90 tablet, Rfl: 3  Past Medical History: Past Medical History:  Diagnosis Date   Adenomatous colon polyp    Aortic root dilatation (HCC)    Noted on echo in August 2023.  Measured 39 mm.   Arthritis    "lower spine; fingers" (11/07/2015)   CAD (coronary artery disease)    a. 10/2015 NSTEMI/Cath: LM 40, LAD 70p, LCX 90ost, RI 90ost, RCA 60-70p, 90-13m/d, 40-50d; b. 10/2015 CABG x 4: LIMA->LAD, VG->Diag, VG->RI, VG->RPDA.   Chronic respiratory failure with hypoxia (HCC)    CKD (chronic kidney disease), stage III (HCC)    Diastolic dysfunction    a. 10/2021 Echo: EF 60-65%,   Diverticulosis  GERD (gastroesophageal reflux disease)    Grade I diastolic dysfunction    High cholesterol    History of bronchitis    Hypertension 10/17/2013   Hypothyroidism    ILD (interstitial lung disease) (HCC)    Mild pulmonary hypertension (HCC)    PAD (peripheral artery disease) (HCC)    s/p left carotid endarterectomy   Post-op atrial fibrillation (HCC) 11/06/2015   Shortness of breath    Type II diabetes mellitus (HCC)    type II   Wears glasses     Tobacco Use: Social History   Tobacco Use  Smoking Status Former   Current packs/day: 0.00   Average packs/day: 1 pack/day for 48.0 years (48.0 ttl pk-yrs)   Types: Cigarettes   Start date: 11/07/1967   Quit  date: 11/07/2015   Years since quitting: 7.5  Smokeless Tobacco Never    Labs: Review Flowsheet  More data exists      Latest Ref Rng & Units 11/14/2015 11/15/2015 01/15/2016 02/03/2022 05/25/2023  Labs for ITP Cardiac and Pulmonary Rehab  Cholestrol 100 - 199 mg/dL - - - - 782   LDL (calc) 0 - 99 mg/dL - - - - 88   HDL-C >95 mg/dL - - - - 59   Trlycerides 0 - 149 mg/dL - - - - 621   Hemoglobin A1c 4.8 - 5.6 % - - 5.5  - -  PH, Arterial 7.350 - 7.450 7.361  7.308  7.366  7.421  7.352  7.361  - - -  PCO2 arterial 35.0 - 45.0 mmHg 40.3  52.3  38.2  39.5  38.4  39.3  - - -  Bicarbonate 20.0 - 28.0 mmol/L 23.0  26.3  21.9  25.7  21.4  22.5  - 26.2  -  TCO2 22 - 32 mmol/L 24  23  28  23  22  24  24  27  26  26  25  28  25  22  23  24   - 27  -  Acid-base deficit 0.0 - 2.0 mmol/L 2.0  3.0  4.0  3.0  - - -  O2 Saturation % 97.0  100.0  99.0  100.0  61.7  95.0  98.0  - 72  -    Details       Multiple values from one day are sorted in reverse-chronological order          Pulmonary Assessment Scores:  Pulmonary Assessment Scores     Row Name 06/02/23 1554         mMRC Score   mMRC Score 1              UCSD: Self-administered rating of dyspnea associated with activities of daily living (ADLs) 6-point scale (0 = "not at all" to 5 = "maximal or unable to do because of breathlessness")  Scoring Scores range from 0 to 120.  Minimally important difference is 5 units  CAT: CAT can identify the health impairment of COPD patients and is better correlated with disease progression.  CAT has a scoring range of zero to 40. The CAT score is classified into four groups of low (less than 10), medium (10 - 20), high (21-30) and very high (31-40) based on the impact level of disease on health status. A CAT score over 10 suggests significant symptoms.  A worsening CAT score could be explained by an exacerbation, poor medication adherence, poor inhaler technique, or progression of COPD or comorbid  conditions.  CAT  MCID is 2 points  mMRC: mMRC (Modified Medical Research Council) Dyspnea Scale is used to assess the degree of baseline functional disability in patients of respiratory disease due to dyspnea. No minimal important difference is established. A decrease in score of 1 point or greater is considered a positive change.   Pulmonary Function Assessment:  Pulmonary Function Assessment - 05/31/23 0940       Breath   Shortness of Breath Yes;Fear of Shortness of Breath;Limiting activity             Exercise Target Goals: Exercise Program Goal: Individual exercise prescription set using results from initial 6 min walk test and THRR while considering  patient's activity barriers and safety.   Exercise Prescription Goal: Initial exercise prescription builds to 30-45 minutes a day of aerobic activity, 2-3 days per week.  Home exercise guidelines will be given to patient during program as part of exercise prescription that the participant will acknowledge.  Education: Aerobic Exercise: - Group verbal and visual presentation on the components of exercise prescription. Introduces F.I.T.T principle from ACSM for exercise prescriptions.  Reviews F.I.T.T. principles of aerobic exercise including progression. Written material given at graduation. Flowsheet Row Cardiac Rehab from 04/01/2016 in [facility] Cardiac and Pulmonary Rehab  Date 02/17/16  Educator [facility]  Instruction Review Code (retired) 2- meets goals/outcomes       Education: Resistance Exercise: - Group verbal and visual presentation on the components of exercise prescription. Introduces F.I.T.T principle from ACSM for exercise prescriptions  Reviews F.I.T.T. principles of resistance exercise including progression. Written material given at graduation. Flowsheet Row Pulmonary Rehab from 12/17/2021 in [facility] Cardiac and Pulmonary Rehab  Date 12/03/21  Educator NT  Instruction Review Code 1- [employer] Understanding         Education: Exercise & Equipment Safety: - Individual verbal instruction and demonstration of equipment use and safety with use of the equipment. Flowsheet Row Pulmonary Rehab from 06/02/2023 in [facility] Cardiac and Pulmonary Rehab  Date 06/02/23  Educator [facility]  Instruction Review Code 1- Verbalizes Understanding       Education: Exercise Physiology & General Exercise Guidelines: - Group verbal and written instruction with models to review the exercise physiology of the cardiovascular system and associated critical values. Provides general exercise guidelines with specific guidelines to those with heart or lung disease.    Education: Flexibility, Balance, Mind/Body Relaxation: - Group verbal and visual presentation with interactive activity on the components of exercise prescription. Introduces F.I.T.T principle from ACSM for exercise prescriptions. Reviews F.I.T.T. principles of flexibility and balance exercise training including progression. Also discusses the mind body connection.  Reviews various relaxation techniques to help reduce and manage stress (i.e. Deep breathing, progressive muscle relaxation, and visualization). Balance handout provided to take home. Written material given at graduation. Flowsheet Row Pulmonary Rehab from 12/17/2021 in [facility] Cardiac and Pulmonary Rehab  Date 12/03/21  Educator NT  Instruction Review Code 1- Verbalizes Understanding       Activity Barriers & Risk Stratification:  Activity Barriers & Cardiac Risk Stratification - 06/02/23 1544       Activity Barriers & Cardiac Risk Stratification   Activity Barriers Deconditioning;Shortness of Breath;Balance Concerns             6 Minute Walk:  6 Minute Walk     Row Name 06/02/23 1540         6 Minute Walk   Phase Initial     Distance 900 feet     Walk Time 6 minutes     #  of Rest Breaks 0     MPH 1.7     METS 2.26     RPE 13     Perceived Dyspnea  2     VO2 Peak 7.9     Symptoms No      Resting HR 73 bpm     Resting BP 104/62     Resting Oxygen Saturation  91 %     Exercise Oxygen Saturation  during 6 min walk 86 %     Max Ex. HR 106 bpm     Max Ex. BP 110/64     2 Minute Post BP 108/62       Interval HR   1 Minute HR 80     2 Minute HR 90     4 Minute HR 64     5 Minute HR 71     6 Minute HR 106     2 Minute Post HR 82     Interval Heart Rate? Yes       Interval Oxygen   Interval Oxygen? Yes     Baseline Oxygen Saturation % 91 %     1 Minute Oxygen Saturation % 93 %     1 Minute Liters of Oxygen 4 L     2 Minute Oxygen Saturation % 93 %     2 Minute Liters of Oxygen 4 L     3 Minute Oxygen Saturation % 91 %     3 Minute Liters of Oxygen 4 L     4 Minute Oxygen Saturation % 89 %     4 Minute Liters of Oxygen 4 L     5 Minute Oxygen Saturation % 87 %     5 Minute Liters of Oxygen 4 L     6 Minute Oxygen Saturation % 86 %     6 Minute Liters of Oxygen 4 L     2 Minute Post Oxygen Saturation % 97 %     2 Minute Post Liters of Oxygen 4 L             Oxygen Initial Assessment:  Oxygen Initial Assessment - 06/02/23 1552       Home Oxygen   Home Oxygen Device Home Concentrator;E-Tanks    Sleep Oxygen Prescription Continuous    Liters per minute 2    Home Exercise Oxygen Prescription Continuous    Liters per minute 4    Home Resting Oxygen Prescription None    Compliance with Home Oxygen Use Yes      Initial 6 min Walk   Oxygen Used Continuous    Liters per minute 4      Program Oxygen Prescription   Program Oxygen Prescription Continuous;E-Tanks    Liters per minute 4    Comments On oxygen for exercise.      Intervention   Short Term Goals Other;To learn and exhibit compliance with exercise, home and travel O2 prescription;To learn and understand importance of monitoring SPO2 with pulse oximeter and demonstrate accurate use of the pulse oximeter.;To learn and understand importance of maintaining oxygen saturations>88%;To learn and demonstrate  proper pursed lip breathing techniques or other breathing techniques. ;To learn and demonstrate proper use of respiratory medications    Long  Term Goals Other;Exhibits proper breathing techniques, such as pursed lip breathing or other method taught during program session;Maintenance of O2 saturations>88%;Verbalizes importance of monitoring SPO2 with pulse oximeter and return demonstration;Compliance with respiratory medication;Exhibits compliance with exercise, home  and  travel O2 prescription;Demonstrates proper use of MDI's             Oxygen Re-Evaluation:   Oxygen Discharge (Final Oxygen Re-Evaluation):   Initial Exercise Prescription:  Initial Exercise Prescription - 06/02/23 1500       Date of Initial Exercise RX and Referring Provider   Date 06/02/23    Referring Provider Dr. Pernell Dupre      Oxygen   Oxygen Continuous    Liters 4    Maintain Oxygen Saturation 88% or higher      Recumbant Bike   Level 1    RPM 50    Watts 25    Minutes 15    METs 2.26      NuStep   Level 1    SPM 80    Minutes 15    METs 2.26      Arm Ergometer   Level 1    RPM 30    Minutes 15    METs 2.26      T5 Nustep   Level 1    SPM 80    Minutes 15    METs 2.26      Track   Laps 23    Minutes 15    METs 2.25      Intensity   THRR 40-80% of Max Heartrate 103-133    Ratings of Perceived Exertion 11-13    Perceived Dyspnea 0-4      Resistance Training   Training Prescription Yes    Weight 4 lb    Reps 10-15             Perform Capillary Blood Glucose checks as needed.  Exercise Prescription Changes:   Exercise Prescription Changes     Row Name 06/02/23 1500             Response to Exercise   Blood Pressure (Admit) 104/62       Blood Pressure (Exercise) 110/64       Blood Pressure (Exit) 108/62       Heart Rate (Admit) 73 bpm       Heart Rate (Exercise) 106 bpm       Heart Rate (Exit) 82 bpm       Oxygen Saturation (Admit) 91 %       Oxygen  Saturation (Exercise) 86 %       Oxygen Saturation (Exit) 97 %       Rating of Perceived Exertion (Exercise) 13       Perceived Dyspnea (Exercise) 2       Symptoms none       Comments results         Progression   Average METs 2.26         Interval Training   Interval Training No                Exercise Comments:   Exercise Goals and Review:   Exercise Goals     Row Name 06/02/23 1550             Exercise Goals   Increase Physical Activity Yes       Intervention Provide advice, education, support and counseling about physical activity/exercise needs.;Develop an individualized exercise prescription for aerobic and resistive training based on initial evaluation findings, risk stratification, comorbidities and participant's personal goals.       Expected Outcomes Short Term: Attend rehab on a regular basis to increase amount of physical activity.;Long Term: Add in home  exercise to make exercise part of routine and to increase amount of physical activity.;Long Term: Exercising regularly at least 3-5 days a week.       Increase Strength and Stamina Yes       Intervention Provide advice, education, support and counseling about physical activity/exercise needs.;Develop an individualized exercise prescription for aerobic and resistive training based on initial evaluation findings, risk stratification, comorbidities and participant's personal goals.       Expected Outcomes Short Term: Increase workloads from initial exercise prescription for resistance, speed, and METs.;Short Term: Perform resistance training exercises routinely during rehab and add in resistance training at home;Long Term: Improve cardiorespiratory fitness, muscular endurance and strength as measured by increased METs and functional capacity ( )       Able to understand and use rate of perceived exertion (RPE) scale Yes       Intervention Provide education and explanation on how to use RPE scale        Expected Outcomes Short Term: Able to use RPE daily in rehab to express subjective intensity level       Able to understand and use Dyspnea scale Yes       Intervention Provide education and explanation on how to use Dyspnea scale       Expected Outcomes Short Term: Able to use Dyspnea scale daily in rehab to express subjective sense of shortness of breath during exertion;Long Term: Able to use Dyspnea scale to guide intensity level when exercising independently       Knowledge and understanding of Target Heart Rate Range (THRR) Yes       Intervention Provide education and explanation of THRR including how the numbers were predicted and where they are located for reference       Expected Outcomes Short Term: Able to state/look up THRR;Short Term: Able to use daily as guideline for intensity in rehab;Long Term: Able to use THRR to govern intensity when exercising independently       Able to check pulse independently Yes       Intervention Provide education and demonstration on how to check pulse in carotid and radial arteries.;Review the importance of being able to check your own pulse for safety during independent exercise       Expected Outcomes Long Term: Able to check pulse independently and accurately;Short Term: Able to explain why pulse checking is important during independent exercise       Understanding of Exercise Prescription Yes       Intervention Provide education, explanation, and written materials on patient's individual exercise prescription       Expected Outcomes Short Term: Able to explain program exercise prescription;Long Term: Able to explain home exercise prescription to exercise independently                Exercise Goals Re-Evaluation :   Discharge Exercise Prescription (Final Exercise Prescription Changes):  Exercise Prescription Changes - 06/02/23 1500       Response to Exercise   Blood Pressure (Admit) 104/62    Blood Pressure (Exercise) 110/64    Blood  Pressure (Exit) 108/62    Heart Rate (Admit) 73 bpm    Heart Rate (Exercise) 106 bpm    Heart Rate (Exit) 82 bpm    Oxygen Saturation (Admit) 91 %    Oxygen Saturation (Exercise) 86 %    Oxygen Saturation (Exit) 97 %    Rating of Perceived Exertion (Exercise) 13    Perceived Dyspnea (Exercise) 2    Symptoms none  Comments results      Progression   Average METs 2.26      Interval Training   Interval Training No             Nutrition:  Target Goals: Understanding of nutrition guidelines, daily intake of sodium 1500mg , cholesterol 200mg , calories 30% from fat and 7% or less from saturated fats, daily to have 5 or more servings of fruits and vegetables.  Education: All About Nutrition: -Group instruction provided by verbal, written material, interactive activities, discussions, models, and posters to present general guidelines for heart healthy nutrition including fat, fiber, MyPlate, the role of sodium in heart healthy nutrition, utilization of the nutrition label, and utilization of this knowledge for meal planning. Follow up email sent as well. Written material given at graduation. Flowsheet Row Pulmonary Rehab from 06/02/2023 in [facility] Cardiac and Pulmonary Rehab  Education need identified 06/02/23       Biometrics:  Pre Biometrics - 06/02/23 1550       Pre Biometrics   Height 5\' 8"  (1.727 m)    Weight 158 lb 6.4 oz (71.8 kg)    Waist Circumference 34 inches    Hip Circumference 36.5 inches    Waist to Hip Ratio 0.93 %    BMI (Calculated) 24.09    Single Leg Stand 5.84 seconds              Nutrition Therapy Plan and Nutrition Goals:   Nutrition Assessments:  MEDIFICTS Score Key: >=70 Need to make dietary changes  40-70 Heart Healthy Diet <= 40 Therapeutic Level Cholesterol Diet  Flowsheet Row Pulmonary Rehab from 06/02/2023 in [facility] Cardiac and Pulmonary Rehab  Picture Your Plate Total Score on Admission 59      Picture Your Plate  Scores: <60 Unhealthy dietary pattern with much room for improvement. 41-50 Dietary pattern unlikely to meet recommendations for good health and room for improvement. 51-60 More healthful dietary pattern, with some room for improvement.  >60 Healthy dietary pattern, although there may be some specific behaviors that could be improved.   Nutrition Goals Re-Evaluation:   Nutrition Goals Discharge (Final Nutrition Goals Re-Evaluation):   Psychosocial: Target Goals: Acknowledge presence or absence of significant depression and/or stress, maximize coping skills, provide positive support system. Participant is able to verbalize types and ability to use techniques and skills needed for reducing stress and depression.   Education: Stress, Anxiety, and Depression - Group verbal and visual presentation to define topics covered.  Reviews how body is impacted by stress, anxiety, and depression.  Also discusses healthy ways to reduce stress and to treat/manage anxiety and depression.  Written material given at graduation. Flowsheet Row Cardiac Rehab from 04/01/2016 in [facility] Cardiac and Pulmonary Rehab  Date 04/01/16  Educator [facility]  Instruction Review Code (retired) 2- meets goals/outcomes       Education: Sleep Hygiene -Provides group verbal and written instruction about how sleep can affect your health.  Define sleep hygiene, discuss sleep cycles and impact of sleep habits. Review good sleep hygiene tips.    Initial Review & Psychosocial Screening:  Initial Psych Review & Screening - 05/31/23 0942       Initial Review   Current issues with None Identified      Family Dynamics   Good Support System? Yes    Comments His wife and two children are a good support system for him. He has five grand kids. He is more angry at the fact that his disease does not let  him do the things he wants to do.      Barriers   Psychosocial barriers to participate in program The patient should benefit from training  in stress management and relaxation.;There are no identifiable barriers or psychosocial needs.      Screening Interventions   Interventions Encouraged to exercise;Provide feedback about the scores to participant;To provide support and resources with identified psychosocial needs    Expected Outcomes Short Term goal: Utilizing psychosocial counselor, staff and physician to assist with identification of specific Stressors or current issues interfering with healing process. Setting desired goal for each stressor or current issue identified.;Long Term Goal: Stressors or current issues are controlled or eliminated.;Short Term goal: Identification and review with participant of any Quality of Life or Depression concerns found by scoring the questionnaire.;Long Term goal: The participant improves quality of Life and PHQ9 Scores as seen by post scores and/or verbalization of changes             Quality of Life Scores:  Scores of 19 and below usually indicate a poorer quality of life in these areas.  A difference of  2-3 points is a clinically meaningful difference.  A difference of 2-3 points in the total score of the Quality of Life Index has been associated with significant improvement in overall quality of life, self-image, physical symptoms, and general health in studies assessing change in quality of life.  PHQ-9: Review Flowsheet  More data exists      06/02/2023 01/05/2022 12/12/2021 09/29/2021 03/25/2016  Depression screen PHQ 2/9  Decreased Interest 0 3 2 0 0  Down, Depressed, Hopeless 0 2 1 1  0  PHQ - 2 Score 0 5 3 1  0  Altered sleeping 0 1 3 3 1   Tired, decreased energy 0 3 1 2  0  Change in appetite 0 3 1 1  0  Feeling bad or failure about yourself  0 2 0 1 0  Trouble concentrating 0 1 0 0 0  Moving slowly or fidgety/restless 0 1 0 0 0  Suicidal thoughts 0 0 0 0 0  PHQ-9 Score 0 16 8 8 1   Difficult doing work/chores Not difficult at all Not difficult at all Somewhat difficult Somewhat  difficult Not difficult at all   Interpretation of Total Score  Total Score Depression Severity:  1-4 = Minimal depression, 5-9 = Mild depression, 10-14 = Moderate depression, 15-19 = Moderately severe depression, 20-27 = Severe depression   Psychosocial Evaluation and Intervention:  Psychosocial Evaluation - 05/31/23 0944       Psychosocial Evaluation & Interventions   Interventions Encouraged to exercise with the program and follow exercise prescription;Relaxation education;Stress management education    Comments His wife and two children are a good support system for him. He has five grand kids. He is more angry at the fact that his disease does not let him do the things he wants to do.    Expected Outcomes Short: Attend [facility] stress management education to decrease stress. Long: Maintain exercise Post [facility] to keep stress at a minimum.    Continue Psychosocial Services  Follow up required by staff             Psychosocial Re-Evaluation:   Psychosocial Discharge (Final Psychosocial Re-Evaluation):   Education: Education Goals: Education classes will be provided on a weekly basis, covering required topics. Participant will state understanding/return demonstration of topics presented.  Learning Barriers/Preferences:  Learning Barriers/Preferences - 05/31/23 0940       Learning Barriers/Preferences  Learning Barriers Sight    Learning Preferences None             General Pulmonary Education Topics:  Infection Prevention: - Provides verbal and written material to individual with discussion of infection control including proper hand washing and proper equipment cleaning during exercise session. Flowsheet Row Pulmonary Rehab from 06/02/2023 in [facility] Cardiac and Pulmonary Rehab  Date 06/02/23  Educator [facility]  Instruction Review Code 1- Verbalizes Understanding       Falls Prevention: - Provides verbal and written material to individual with discussion of  falls prevention and safety. Flowsheet Row Pulmonary Rehab from 06/02/2023 in [facility] Cardiac and Pulmonary Rehab  Date 06/02/23  Educator [facility]  Instruction Review Code 1- Verbalizes Understanding       Chronic Lung Disease Review: - Group verbal instruction with posters, models, PowerPoint presentations and videos,  to review new updates, new respiratory medications, new advancements in procedures and treatments. Providing information on websites and "800" numbers for continued self-education. Includes information about supplement oxygen, available portable oxygen systems, continuous and intermittent flow rates, oxygen safety, concentrators, and Medicare reimbursement for oxygen. Explanation of Pulmonary Drugs, including class, frequency, complications, importance of spacers, rinsing mouth after steroid MDI's, and proper cleaning methods for nebulizers. Review of basic lung anatomy and physiology related to function, structure, and complications of lung disease. Review of risk factors. Discussion about methods for diagnosing sleep apnea and types of masks and machines for OSA. Includes a review of the use of types of environmental controls: home humidity, furnaces, filters, dust mite/pet prevention, HEPA vacuums. Discussion about weather changes, air quality and the benefits of nasal washing. Instruction on Warning signs, infection symptoms, calling MD promptly, preventive modes, and value of vaccinations. Review of effective airway clearance, coughing and/or vibration techniques. Emphasizing that all should Create an Action Plan. Written material given at graduation. Flowsheet Row Pulmonary Rehab from 12/17/2021 in [facility] Cardiac and Pulmonary Rehab  Education need identified 09/29/21  Date 11/06/21  Educator [facility]  Instruction Review Code 1- Verbalizes Understanding       AED/CPR: - Group verbal and written instruction with the use of models to demonstrate the basic use of the AED with the basic ABC's of  resuscitation.    Anatomy and Cardiac Procedures: - Group verbal and visual presentation and models provide information about basic cardiac anatomy and function. Reviews the testing methods done to diagnose heart disease and the outcomes of the test results. Describes the treatment choices: Medical Management, Angioplasty, or Coronary Bypass Surgery for treating various heart conditions including Myocardial Infarction, Angina, Valve Disease, and Cardiac Arrhythmias.  Written material given at graduation. Flowsheet Row Cardiac Rehab from 04/01/2016 in [facility] Cardiac and Pulmonary Rehab  Date 02/24/16  Educator TS  Instruction Review Code (retired) 2- meets goals/outcomes       Medication Safety: - Group verbal and visual instruction to review commonly prescribed medications for heart and lung disease. Reviews the medication, class of the drug, and side effects. Includes the steps to properly store meds and maintain the prescription regimen.  Written material given at graduation. Flowsheet Row Cardiac Rehab from 04/01/2016 in [facility] Cardiac and Pulmonary Rehab  Date 03/11/16  [name] 2]  Educator TS  Instruction Review Code (retired) 2- meets goals/outcomes       Other: -Provides group and verbal instruction on various topics (see comments)   Knowledge Questionnaire Score:  Knowledge Questionnaire Score - 06/02/23 1555       Knowledge Questionnaire Score   Pre  Score 25              Core Components/Risk Factors/Patient Goals at Admission:  Personal Goals and Risk Factors at Admission - 05/31/23 0940       Core Components/Risk Factors/Patient Goals on Admission    Weight Management Yes;Weight Maintenance    Intervention Weight Management: Develop a combined nutrition and exercise program designed to reach desired caloric intake, while maintaining appropriate intake of nutrient and fiber, sodium and fats, and appropriate energy expenditure required for the weight goal.;Weight  Management: Provide education and appropriate resources to help participant work on and attain dietary goals.;Weight Management/Obesity: Establish reasonable short term and long term weight goals.    Expected Outcomes Short Term: Continue to assess and modify interventions until short term weight is achieved;Understanding recommendations for meals to include 15-35% energy as protein, 25-35% energy from fat, 35-60% energy from carbohydrates, less than 200mg  of dietary cholesterol, 20-35 gm of total fiber daily;Understanding of distribution of calorie intake throughout the day with the consumption of 4-5 meals/snacks;Long Term: Adherence to nutrition and physical activity/exercise program aimed toward attainment of established weight goal;Weight Maintenance: Understanding of the daily nutrition guidelines, which includes 25-35% calories from fat, 7% or less cal from saturated fats, less than 200mg  cholesterol, less than 1.5gm of sodium, & 5 or more servings of fruits and vegetables daily    Improve shortness of breath with ADL's Yes    Intervention Provide education, individualized exercise plan and daily activity instruction to help decrease symptoms of SOB with activities of daily living.    Expected Outcomes Short Term: Improve cardiorespiratory fitness to achieve a reduction of symptoms when performing ADLs    Diabetes Yes    Intervention Provide education about signs/symptoms and action to take for hypo/hyperglycemia.;Provide education about proper nutrition, including hydration, and aerobic/resistive exercise prescription along with prescribed medications to achieve blood glucose in normal ranges: Fasting glucose 65-99 mg/dL    Expected Outcomes Short Term: Participant verbalizes understanding of the signs/symptoms and immediate care of hyper/hypoglycemia, proper foot care and importance of medication, aerobic/resistive exercise and nutrition plan for blood glucose control.;Long Term: Attainment of HbA1C  < 7%.    Hypertension Yes    Intervention Provide education on lifestyle modifcations including regular physical activity/exercise, weight management, moderate sodium restriction and increased consumption of fresh fruit, vegetables, and low fat dairy, alcohol moderation, and smoking cessation.;Monitor prescription use compliance.    Expected Outcomes Short Term: Continued assessment and intervention until BP is < 140/68mm HG in hypertensive participants. < 130/15mm HG in hypertensive participants with diabetes, heart failure or chronic kidney disease.;Long Term: Maintenance of blood pressure at goal levels.    Lipids Yes    Intervention Provide education and support for participant on nutrition & aerobic/resistive exercise along with prescribed medications to achieve LDL 70mg , HDL >40mg .    Expected Outcomes Short Term: Participant states understanding of desired cholesterol values and is compliant with medications prescribed. Participant is following exercise prescription and nutrition guidelines.;Long Term: Cholesterol controlled with medications as prescribed, with individualized exercise RX and with personalized nutrition plan. Value goals: LDL < 70mg , HDL > 40 mg.             Education:Diabetes - Individual verbal and written instruction to review signs/symptoms of diabetes, desired ranges of glucose level fasting, after meals and with exercise. Acknowledge that pre and post exercise glucose checks will be done for 3 sessions at entry of program. Flowsheet Row Pulmonary Rehab from 06/02/2023 in [facility] Cardiac and  Pulmonary Rehab  Date 06/02/23  Educator [facility]  Instruction Review Code 1- Verbalizes Understanding       Know Your Numbers and Heart Failure: - Group verbal and visual instruction to discuss disease risk factors for cardiac and pulmonary disease and treatment options.  Reviews associated critical values for Overweight/Obesity, Hypertension, Cholesterol, and Diabetes.  Discusses  basics of heart failure: signs/symptoms and treatments.  Introduces Heart Failure Zone chart for action plan for heart failure.  Written material given at graduation. Flowsheet Row Pulmonary Rehab from 12/17/2021 in [facility] Cardiac and Pulmonary Rehab  Date 10/29/21  Educator SB  Instruction Review Code 1- Verbalizes Understanding       Core Components/Risk Factors/Patient Goals Review:    Core Components/Risk Factors/Patient Goals at Discharge (Final Review):    ITP Comments:  ITP Comments     Row Name 05/31/23 [phone] 06/02/23 1544         ITP Comments Virtual Visit completed. Patient informed on EP and RD appointment and 6 Minute walk test. Patient also informed of patient health questionnaires on My Chart. Patient Verbalizes understanding. Visit diagnosis can be found in [facility] media, patient is [place]. Completed and gym orientation. Initial ITP created and sent for review to Dr. Jinny Sanders, Medical Director.               Comments: Initial ITP

## 2023-06-14 ENCOUNTER — Encounter: Admitting: *Deleted

## 2023-06-14 DIAGNOSIS — J849 Interstitial pulmonary disease, unspecified: Secondary | ICD-10-CM | POA: Diagnosis not present

## 2023-06-14 LAB — GLUCOSE, CAPILLARY
Glucose-Capillary: 180 mg/dL — ABNORMAL HIGH (ref 70–99)
Glucose-Capillary: 174 mg/dL — ABNORMAL HIGH (ref 70–99)

## 2023-06-14 NOTE — Progress Notes (Signed)
 Daily Session Note  Patient Details  Name: Brian Casey MRN: [id number] Date of Birth: 1951-01-23 Referring Provider:   Flowsheet Row Pulmonary Rehab from 06/02/2023 in [facility] Cardiac and Pulmonary Rehab  Referring Provider Dr. Pernell Dupre       Encounter Date: 06/14/2023  Check In:  Session Check In - 06/14/23 1622       Check-In   Supervising physician immediately available to respond to emergencies See telemetry face sheet for immediately available ER MD    Location [facility]-Cardiac & Pulmonary Rehab    Staff Present Rory Percy, MS, Exercise Physiologist;Kelly Cloretta Ned, ACSM CEP, Exercise Physiologist;Meredith Jewel Baize RN,BSN;Maxon Conetta BS, Exercise Physiologist;Desiderio Dolata, RN, BSN, CCRP    Virtual Visit No    Medication changes reported     No    Fall or balance concerns reported    No    Warm-up and Cool-down Performed on first and last piece of equipment    Resistance Training Performed Yes    VAD Patient? No    PAD/SET Patient? No      Pain Assessment   Currently in Pain? No/denies                Social History   Tobacco Use  Smoking Status Former   Current packs/day: 0.00   Average packs/day: 1 pack/day for 48.0 years (48.0 ttl pk-yrs)   Types: Cigarettes   Start date: 11/07/1967   Quit date: 11/07/2015   Years since quitting: 7.6  Smokeless Tobacco Never    Goals Met:  Proper associated with RPD/PD & O2 Sat Exercise tolerated well Personal goals reviewed No report of concerns or symptoms today  Goals Unmet:  Not Applicable  Comments: Pt able to follow exercise prescription today without complaint.  Will continue to monitor for progression. First full day of exercise!  Patient was oriented to gym and equipment including functions, settings, policies, and procedures.  Patient's individual exercise prescription and treatment plan were reviewed.  All starting workloads were established based on the results of the 6 minute walk test done at  initial orientation visit.  The plan for exercise progression was also introduced and progression will be customized based on patient's performance and goals.    Dr. Bethann Punches is Medical Director for [facility] Cardiac Rehabilitation.  Dr. Vida Rigger is Medical Director for [facility] Pulmonary Rehabilitation.

## 2023-06-16 ENCOUNTER — Encounter: Admitting: *Deleted

## 2023-06-16 DIAGNOSIS — J849 Interstitial pulmonary disease, unspecified: Secondary | ICD-10-CM

## 2023-06-16 LAB — GLUCOSE, CAPILLARY
Glucose-Capillary: 199 mg/dL — ABNORMAL HIGH (ref 70–99)
Glucose-Capillary: 173 mg/dL — ABNORMAL HIGH (ref 70–99)

## 2023-06-16 NOTE — Progress Notes (Signed)
 Daily Session Note  Patient Details  Name: SALLY REIMERS MRN: [id number] Date of Birth: 12-06-1950 Referring Provider:   Flowsheet Row Pulmonary Rehab from 06/02/2023 in [facility] Cardiac and Pulmonary Rehab  Referring Provider Dr. Pernell Dupre       Encounter Date: 06/16/2023  Check In:  Session Check In - 06/16/23 1153       Check-In   Supervising physician immediately available to respond to emergencies See telemetry face sheet for immediately available ER MD    Location [facility]-Cardiac & Pulmonary Rehab    Staff Present Cora Collum, RN, BSN, CCRP;Joseph Hood RCP,RRT,BSRT;Noah Tickle, BS, Exercise Physiologist;Maxon Conetta BS, Exercise Physiologist;Jason Wallace Cullens RDN,LDN    Virtual Visit No    Medication changes reported     No    Fall or balance concerns reported    No    Warm-up and Cool-down Performed on first and last piece of equipment    Resistance Training Performed Yes    VAD Patient? No    PAD/SET Patient? No      Pain Assessment   Currently in Pain? No/denies                Social History   Tobacco Use  Smoking Status Former   Current packs/day: 0.00   Average packs/day: 1 pack/day for 48.0 years (48.0 ttl pk-yrs)   Types: Cigarettes   Start date: 11/07/1967   Quit date: 11/07/2015   Years since quitting: 7.6  Smokeless Tobacco Never    Goals Met:  Proper associated with RPD/PD & O2 Sat Independence with exercise equipment Exercise tolerated well No report of concerns or symptoms today  Goals Unmet:  Not Applicable  Comments: Pt able to follow exercise prescription today without complaint.  Will continue to monitor for progression.    Dr. Bethann Punches is Medical Director for [facility] Cardiac Rehabilitation.  Dr. Vida Rigger is Medical Director for [facility] Pulmonary Rehabilitation.

## 2023-06-18 ENCOUNTER — Encounter: Admitting: *Deleted

## 2023-06-18 DIAGNOSIS — J849 Interstitial pulmonary disease, unspecified: Secondary | ICD-10-CM

## 2023-06-18 LAB — GLUCOSE, CAPILLARY
Glucose-Capillary: 156 mg/dL — ABNORMAL HIGH (ref 70–99)
Glucose-Capillary: 150 mg/dL — ABNORMAL HIGH (ref 70–99)

## 2023-06-18 NOTE — Progress Notes (Signed)
 Daily Session Note  Patient Details  Name: Brian Casey MRN: [id number] Date of Birth: 11-14-1950 Referring Provider:   Flowsheet Row Pulmonary Rehab from 06/02/2023 in [facility] Cardiac and Pulmonary Rehab  Referring Provider Dr. Pernell Dupre       Encounter Date: 06/18/2023  Check In:  Session Check In - 06/18/23 1140       Check-In   Supervising physician immediately available to respond to emergencies See telemetry face sheet for immediately available ER MD    Location [facility]-Cardiac & Pulmonary Rehab    Staff Present Cora Collum, RN, BSN, CCRP;Joseph Hood RCP,RRT,BSRT;Noah Tickle, [place], Exercise Physiologist    Virtual Visit No    Medication changes reported     No    Fall or balance concerns reported    No    Warm-up and Cool-down Performed on first and last piece of equipment    Resistance Training Performed Yes    VAD Patient? No    PAD/SET Patient? No      Pain Assessment   Currently in Pain? No/denies                Social History   Tobacco Use  Smoking Status Former   Current packs/day: 0.00   Average packs/day: 1 pack/day for 48.0 years (48.0 ttl pk-yrs)   Types: Cigarettes   Start date: 11/07/1967   Quit date: 11/07/2015   Years since quitting: 7.6  Smokeless Tobacco Never    Goals Met:  Proper associated with RPD/PD & O2 Sat Independence with exercise equipment Exercise tolerated well No report of concerns or symptoms today  Goals Unmet:  Not Applicable  Comments: Pt able to follow exercise prescription today without complaint.  Will continue to monitor for progression.    Dr. Bethann Punches is Medical Director for [facility] Cardiac Rehabilitation.  Dr. Vida Rigger is Medical Director for [facility] Pulmonary Rehabilitation.

## 2023-06-21 ENCOUNTER — Encounter: Admitting: *Deleted

## 2023-06-21 DIAGNOSIS — J849 Interstitial pulmonary disease, unspecified: Secondary | ICD-10-CM

## 2023-06-21 NOTE — Progress Notes (Signed)
 Daily Session Note  Patient Details  Name: Brian Casey MRN: [id number] Date of Birth: [birth year] Referring Provider:   Flowsheet Row Pulmonary Rehab from 06/02/2023 in [facility] Cardiac and Pulmonary Rehab  Referring Provider Dr. Pernell Dupre       Encounter Date: 06/21/2023  Check In:  Session Check In - 06/21/23 1140       Check-In   Supervising physician immediately available to respond to emergencies See telemetry face sheet for immediately available ER MD    Location [facility]-Cardiac & Pulmonary Rehab    Staff Present Rory Percy, MS, Exercise Physiologist;Tarry Blayney, RN, BSN, CCRP;Maxon Conetta BS, Exercise Physiologist;Kelly Cloretta Ned, ACSM CEP, Exercise Physiologist    Virtual Visit No    Medication changes reported     No    Fall or balance concerns reported    No    Warm-up and Cool-down Performed on first and last piece of equipment    Resistance Training Performed Yes    VAD Patient? No    PAD/SET Patient? No      Pain Assessment   Currently in Pain? No/denies                Social History   Tobacco Use  Smoking Status Former   Current packs/day: 0.00   Average packs/day: 1 pack/day for 48.0 years (48.0 ttl pk-yrs)   Types: Cigarettes   Start date: 11/07/1967   Quit date: 11/07/2015   Years since quitting: 7.6  Smokeless Tobacco Never    Goals Met:  Proper associated with RPD/PD & O2 Sat Independence with exercise equipment Exercise tolerated well No report of concerns or symptoms today  Goals Unmet:  Not Applicable  Comments: Pt able to follow exercise prescription today without complaint.  Will continue to monitor for progression.    Dr. Bethann Punches is Medical Director for [facility] Cardiac Rehabilitation.  Dr. Vida Rigger is Medical Director for [facility] Pulmonary Rehabilitation.

## 2023-06-23 ENCOUNTER — Encounter: Attending: Family Medicine | Admitting: *Deleted

## 2023-06-23 DIAGNOSIS — J849 Interstitial pulmonary disease, unspecified: Secondary | ICD-10-CM | POA: Diagnosis present

## 2023-06-23 NOTE — Progress Notes (Signed)
 Daily Session Note  Patient Details  Name: Brian Casey MRN: [id number] Date of Birth: 24-Nov-1950 Referring Provider:   Flowsheet Row Pulmonary Rehab from 06/02/2023 in [facility] Cardiac and Pulmonary Rehab  Referring Provider Dr. Pernell Dupre       Encounter Date: 06/23/2023  Check In:  Session Check In - 06/23/23 1127       Check-In   Supervising physician immediately available to respond to emergencies See telemetry face sheet for immediately available ER MD    Location [facility]-Cardiac & Pulmonary Rehab    Staff Present Cora Collum, RN, BSN, CCRP;Joseph Hood RCP,RRT,BSRT;Maxon [place] BS, Exercise Physiologist;Noah Tickle, BS, Exercise Physiologist;Jason Wallace Cullens RDN,LDN    Virtual Visit No    Medication changes reported     No    Fall or balance concerns reported    No    Warm-up and Cool-down Performed on first and last piece of equipment    Resistance Training Performed Yes    VAD Patient? No    PAD/SET Patient? No      Pain Assessment   Currently in Pain? No/denies                Social History   Tobacco Use  Smoking Status Former   Current packs/day: 0.00   Average packs/day: 1 pack/day for 48.0 years (48.0 ttl pk-yrs)   Types: Cigarettes   Start date: 11/07/1967   Quit date: 11/07/2015   Years since quitting: 7.6  Smokeless Tobacco Never    Goals Met:  Proper associated with RPD/PD & O2 Sat Independence with exercise equipment Exercise tolerated well No report of concerns or symptoms today  Goals Unmet:  Not Applicable  Comments: Pt able to follow exercise prescription today without complaint.  Will continue to monitor for progression.    Dr. Bethann Punches is Medical Director for [facility] Cardiac Rehabilitation.  Dr. Vida Rigger is Medical Director for [facility] Pulmonary Rehabilitation.

## 2023-06-25 ENCOUNTER — Encounter: Admitting: *Deleted

## 2023-06-25 DIAGNOSIS — J849 Interstitial pulmonary disease, unspecified: Secondary | ICD-10-CM | POA: Diagnosis not present

## 2023-06-25 NOTE — Progress Notes (Signed)
 Daily Session Note  Patient Details  Name: Brian Casey MRN: [id number] Date of Birth: May 11, 1950 Referring Provider:   Flowsheet Row Pulmonary Rehab from 06/02/2023 in [facility] Cardiac and Pulmonary Rehab  Referring Provider Dr. Pernell Dupre       Encounter Date: 06/25/2023  Check In:  Session Check In - 06/25/23 1107       Check-In   Supervising physician immediately available to respond to emergencies See telemetry face sheet for immediately available ER MD    Location [facility]-Cardiac & Pulmonary Rehab    Staff Present Rory Percy, MS, Exercise Physiologist;Maxon Conetta BS, Exercise Physiologist;Noah Tickle, BS, Exercise Physiologist;Telia Amundson, RN, BSN, CCRP    Virtual Visit No    Medication changes reported     No    Fall or balance concerns reported    No    Warm-up and Cool-down Performed on first and last piece of equipment    Resistance Training Performed Yes    VAD Patient? No    PAD/SET Patient? No      Pain Assessment   Currently in Pain? No/denies                Social History   Tobacco Use  Smoking Status Former   Current packs/day: 0.00   Average packs/day: 1 pack/day for 48.0 years (48.0 ttl pk-yrs)   Types: Cigarettes   Start date: 11/07/1967   Quit date: 11/07/2015   Years since quitting: 7.6  Smokeless Tobacco Never    Goals Met:  Proper associated with RPD/PD & O2 Sat Independence with exercise equipment Exercise tolerated well No report of concerns or symptoms today  Goals Unmet:  Not Applicable  Comments: Pt able to follow exercise prescription today without complaint.  Will continue to monitor for progression.    Dr. Bethann Punches is Medical Director for [facility] Cardiac Rehabilitation.  Dr. Vida Rigger is Medical Director for [facility] Pulmonary Rehabilitation.

## 2023-06-28 ENCOUNTER — Encounter: Admitting: *Deleted

## 2023-06-28 DIAGNOSIS — J849 Interstitial pulmonary disease, unspecified: Secondary | ICD-10-CM

## 2023-06-28 NOTE — Progress Notes (Signed)
 Daily Session Note  Patient Details  Name: Brian Casey MRN: [id number] Date of Birth: 23-Oct-1950 Referring Provider:   Flowsheet Row Pulmonary Rehab from 06/02/2023 in [facility] Cardiac and Pulmonary Rehab  Referring Provider Dr. Pernell Dupre       Encounter Date: 06/28/2023  Check In:  Session Check In - 06/28/23 1124       Check-In   Supervising physician immediately available to respond to emergencies See telemetry face sheet for immediately available ER MD    Location [facility]-Cardiac & Pulmonary Rehab    Staff Present Maxon Conetta BS, Exercise Physiologist;Kelly Cloretta Ned, ACSM CEP, Exercise Physiologist;[employer];Cora Collum, RN, BSN, CCRP    Virtual Visit No    Medication changes reported     No    Fall or balance concerns reported    No    Warm-up and Cool-down Performed on first and last piece of equipment    Resistance Training Performed Yes    VAD Patient? No    PAD/SET Patient? No      Pain Assessment   Currently in Pain? No/denies                Social History   Tobacco Use  Smoking Status Former   Current packs/day: 0.00   Average packs/day: 1 pack/day for 48.0 years (48.0 ttl pk-yrs)   Types: Cigarettes   Start date: 11/07/1967   Quit date: 11/07/2015   Years since quitting: 7.6  Smokeless Tobacco Never    Goals Met:  Proper associated with RPD/PD & O2 Sat Independence with exercise equipment Exercise tolerated well No report of concerns or symptoms today  Goals Unmet:  Not Applicable  Comments: Pt able to follow exercise prescription today without complaint.  Will continue to monitor for progression.    Dr. Bethann Punches is Medical Director for [facility] Cardiac Rehabilitation.  Dr. Vida Rigger is Medical Director for [facility] Pulmonary Rehabilitation.

## 2023-06-30 ENCOUNTER — Encounter

## 2023-06-30 ENCOUNTER — Encounter: Payer: Self-pay | Admitting: *Deleted

## 2023-06-30 DIAGNOSIS — J849 Interstitial pulmonary disease, unspecified: Secondary | ICD-10-CM

## 2023-06-30 NOTE — Progress Notes (Signed)
 Daily Session Note  Patient Details  Name: RILYN UPSHAW MRN: [id number] Date of Birth: 03-09-1951 Referring Provider:   Flowsheet Row Pulmonary Rehab from 06/02/2023 in [facility]  Referring Provider Dr. Pernell Dupre       Encounter Date: 06/30/2023  Check In:  Session Check In - 06/30/23 1100       Check-In   Supervising physician immediately available to respond to emergencies See telemetry face sheet for immediately available ER MD    Location [facility]    Staff Present Susann Givens RN,BSN;Shantoria Ellwood RN,BSN,MPA;Joseph Reino Kent RCP,RRT,BSRT;Margaret Best, MS, Exercise Physiologist    Virtual Visit No    Medication changes reported     No    Fall or balance concerns reported    No    Tobacco Cessation No Change    Warm-up and Cool-down Performed on first and last piece of equipment    Resistance Training Performed Yes    VAD Patient? No    PAD/SET Patient? No      Pain Assessment   Currently in Pain? No/denies                Social History   Tobacco Use  Smoking Status Former   Current packs/day: 0.00   Average packs/day: 1 pack/day for 48.0 years (48.0 ttl pk-yrs)   Types: Cigarettes   Start date: 11/07/1967   Quit date: 11/07/2015   Years since quitting: 7.6  Smokeless Tobacco Never    Goals Met:  Independence with exercise equipment Exercise tolerated well No report of concerns or symptoms today Strength training completed today  Goals Unmet:  Not Applicable  Comments: Pt able to follow exercise prescription today without complaint.  Will continue to monitor for progression.    Dr. Bethann Punches is Medical Director for [facility].  Dr. Vida Rigger is Medical Director for [facility].

## 2023-06-30 NOTE — Progress Notes (Signed)
 Pulmonary Individual Treatment Plan  Patient Details  Name: Brian Casey MRN: [id number] Date of Birth: 1950/06/07 Referring Provider:   Flowsheet Row Pulmonary Rehab from 06/02/2023 in [facility] Cardiac and Pulmonary Rehab  Referring Provider Dr. Pernell Dupre       Initial Encounter Date:  Flowsheet Row Pulmonary Rehab from 06/02/2023 in [facility] Cardiac and Pulmonary Rehab  Date 06/02/23       Visit Diagnosis: ILD (interstitial lung disease) (HCC)  Patient's Home Medications on Admission:  Current Outpatient Medications:    aspirin EC 81 MG tablet, Take 81 mg by mouth daily. Swallow whole., Disp: , Rfl:    atorvastatin (LIPITOR) 40 MG tablet, Take 1 tablet (40 mg total) by mouth daily., Disp: 90 tablet, Rfl: 3   carvedilol (COREG) 12.5 MG tablet, Take 1 tablet (12.5 mg total) by mouth 2 (two) times daily with a meal., Disp: 180 tablet, Rfl: 3   empagliflozin (JARDIANCE) 10 MG TABS tablet, Take 25 mg by mouth daily., Disp: , Rfl:    ezetimibe (ZETIA) 10 MG tablet, Take 1 tablet (10 mg total) by mouth daily., Disp: 90 tablet, Rfl: 3   furosemide (LASIX) 20 MG tablet, Take 1 tablet (20 mg total) by mouth daily., Disp: 90 tablet, Rfl: 3   glucose blood test strip, Use to test blood sugar twice a day, Disp: 200 each, Rfl: 3   levothyroxine (SYNTHROID) 100 MCG tablet, Take 1 tablet (100 mcg total) by mouth, on an empty stomach, daily with a glass of water at least 30-60 minutes before breakfast., Disp: 90 tablet, Rfl: 3   loperamide (IMODIUM) 2 MG capsule, Take 2 mg by mouth as needed for diarrhea or loose stools. (Patient not taking: Reported on 05/31/2023), Disp: , Rfl:    NEOMYCIN-POLYMYXIN-HYDROCORTISONE (CORTISPORIN) 1 % SOLN OTIC solution, Apply 1-2 drops to toe BID after soaking, Disp: 10 mL, Rfl: 1   omeprazole (PRILOSEC) 20 MG capsule, Take 1 capsule (20 mg total) by mouth daily., Disp: 90 capsule, Rfl: 3   ondansetron (ZOFRAN) 4 MG tablet, Take 1 tablet (4 mg total) by mouth every 8  (eight) hours as needed for nausea and/or vomitting. (Patient not taking: Reported on 05/31/2023), Disp: 20 tablet, Rfl: 3   OXYGEN, Inhale 4 L into the lungs daily., Disp: , Rfl:    Pirfenidone 801 MG TABS, Take 1 tablet (801 mg total) by mouth in the morning and at bedtime. **low dose as maintenance**, Disp: 180 tablet, Rfl: 1   PRECISION QID TEST test strip, Use 2 (two) times daily, Disp: , Rfl:    predniSONE (DELTASONE) 10 MG tablet, Take 1 tablet (10 mg total) by mouth daily with breakfast., Disp: 90 tablet, Rfl: 1   tadalafil (CIALIS) 10 MG tablet, Take 1 tablet (10 mg total) by mouth daily as needed for erectile dysfunction., Disp: 90 tablet, Rfl: 3  Past Medical History: Past Medical History:  Diagnosis Date   Adenomatous colon polyp    Aortic root dilatation (HCC)    Noted on echo in August 2023.  Measured 39 mm.   Arthritis    "lower spine; fingers" (11/07/2015)   CAD (coronary artery disease)    a. 10/2015 NSTEMI/Cath: LM 40, LAD 70p, LCX 90ost, RI 90ost, RCA 60-70p, 90-48m/d, 40-50d; b. 10/2015 CABG x 4: LIMA->LAD, VG->Diag, VG->RI, VG->RPDA.   Chronic respiratory failure with hypoxia (HCC)    CKD (chronic kidney disease), stage III (HCC)    Diastolic dysfunction    a. 10/2021 Echo: EF 60-65%,   Diverticulosis  GERD (gastroesophageal reflux disease)    Grade I diastolic dysfunction    High cholesterol    History of bronchitis    Hypertension 10/17/2013   Hypothyroidism    ILD (interstitial lung disease) (HCC)    Mild pulmonary hypertension (HCC)    PAD (peripheral artery disease) (HCC)    s/p left carotid endarterectomy   Post-op atrial fibrillation (HCC) 11/06/2015   Shortness of breath    Type II diabetes mellitus (HCC)    type II   Wears glasses     Tobacco Use: Social History   Tobacco Use  Smoking Status Former   Current packs/day: 0.00   Average packs/day: 1 pack/day for 48.0 years (48.0 ttl pk-yrs)   Types: Cigarettes   Start date: 11/07/1967   Quit  date: 11/07/2015   Years since quitting: 7.6  Smokeless Tobacco Never    Labs: Review Flowsheet  More data exists      Latest Ref Rng & Units 11/14/2015 11/15/2015 01/15/2016 02/03/2022 05/25/2023  Labs for ITP Cardiac and Pulmonary Rehab  Cholestrol 100 - 199 mg/dL - - - - 130   LDL (calc) 0 - 99 mg/dL - - - - 88   HDL-C >86 mg/dL - - - - 59   Trlycerides 0 - 149 mg/dL - - - - 578   Hemoglobin A1c 4.8 - 5.6 % - - 5.5  - -  PH, Arterial 7.350 - 7.450 7.361  7.308  7.366  7.421  7.352  7.361  - - -  PCO2 arterial 35.0 - 45.0 mmHg 40.3  52.3  38.2  39.5  38.4  39.3  - - -  Bicarbonate 20.0 - 28.0 mmol/L 23.0  26.3  21.9  25.7  21.4  22.5  - 26.2  -  TCO2 22 - 32 mmol/L 24  23  28  23  22  24  24  27  26  26  25  28  25  22  23  24   - 27  -  Acid-base deficit 0.0 - 2.0 mmol/L 2.0  3.0  4.0  3.0  - - -  O2 Saturation % 97.0  100.0  99.0  100.0  61.7  95.0  98.0  - 72  -    Details       Multiple values from one day are sorted in reverse-chronological order          Pulmonary Assessment Scores:  Pulmonary Assessment Scores     Row Name 06/02/23 1554         mMRC Score   mMRC Score 1              UCSD: Self-administered rating of dyspnea associated with activities of daily living (ADLs) 6-point scale (0 = "not at all" to 5 = "maximal or unable to do because of breathlessness")  Scoring Scores range from 0 to 120.  Minimally important difference is 5 units  CAT: CAT can identify the health impairment of COPD patients and is better correlated with disease progression.  CAT has a scoring range of zero to 40. The CAT score is classified into four groups of low (less than 10), medium (10 - 20), high (21-30) and very high (31-40) based on the impact level of disease on health status. A CAT score over 10 suggests significant symptoms.  A worsening CAT score could be explained by an exacerbation, poor medication adherence, poor inhaler technique, or progression of COPD or comorbid  conditions.  CAT  MCID is 2 points  mMRC: mMRC (Modified Medical Research Council) Dyspnea Scale is used to assess the degree of baseline functional disability in patients of respiratory disease due to dyspnea. No minimal important difference is established. A decrease in score of 1 point or greater is considered a positive change.   Pulmonary Function Assessment:  Pulmonary Function Assessment - 05/31/23 0940       Breath   Shortness of Breath Yes;Fear of Shortness of Breath;Limiting activity             Exercise Target Goals: Exercise Program Goal: Individual exercise prescription set using results from initial 6 min walk test and THRR while considering  patient's activity barriers and safety.   Exercise Prescription Goal: Initial exercise prescription builds to 30-45 minutes a day of aerobic activity, 2-3 days per week.  Home exercise guidelines will be given to patient during program as part of exercise prescription that the participant will acknowledge.  Education: Aerobic Exercise: - Group verbal and visual presentation on the components of exercise prescription. Introduces F.I.T.T principle from ACSM for exercise prescriptions.  Reviews F.I.T.T. principles of aerobic exercise including progression. Written material given at graduation. Flowsheet Row Cardiac Rehab from 04/01/2016 in [facility] Cardiac and Pulmonary Rehab  Date 02/17/16  Educator [facility]  Instruction Review Code (retired) 2- meets goals/outcomes       Education: Resistance Exercise: - Group verbal and visual presentation on the components of exercise prescription. Introduces F.I.T.T principle from ACSM for exercise prescriptions  Reviews F.I.T.T. principles of resistance exercise including progression. Written material given at graduation. Flowsheet Row Pulmonary Rehab from 12/17/2021 in [facility] Cardiac and Pulmonary Rehab  Date 12/03/21  Educator NT  Instruction Review Code 1- [employer] Understanding         Education: Exercise & Equipment Safety: - Individual verbal instruction and demonstration of equipment use and safety with use of the equipment. Flowsheet Row Pulmonary Rehab from 06/16/2023 in [facility] Cardiac and Pulmonary Rehab  Date 06/02/23  Educator [facility]  Instruction Review Code 1- Verbalizes Understanding       Education: Exercise Physiology & General Exercise Guidelines: - Group verbal and written instruction with models to review the exercise physiology of the cardiovascular system and associated critical values. Provides general exercise guidelines with specific guidelines to those with heart or lung disease.    Education: Flexibility, Balance, Mind/Body Relaxation: - Group verbal and visual presentation with interactive activity on the components of exercise prescription. Introduces F.I.T.T principle from ACSM for exercise prescriptions. Reviews F.I.T.T. principles of flexibility and balance exercise training including progression. Also discusses the mind body connection.  Reviews various relaxation techniques to help reduce and manage stress (i.e. Deep breathing, progressive muscle relaxation, and visualization). Balance handout provided to take home. Written material given at graduation. Flowsheet Row Pulmonary Rehab from 12/17/2021 in [facility] Cardiac and Pulmonary Rehab  Date 12/03/21  Educator NT  Instruction Review Code 1- Verbalizes Understanding       Activity Barriers & Risk Stratification:  Activity Barriers & Cardiac Risk Stratification - 06/02/23 1544       Activity Barriers & Cardiac Risk Stratification   Activity Barriers Deconditioning;Shortness of Breath;Balance Concerns             6 Minute Walk:  6 Minute Walk     Row Name 06/02/23 1540         6 Minute Walk   Phase Initial     Distance 900 feet     Walk Time 6 minutes     #  of Rest Breaks 0     MPH 1.7     METS 2.26     RPE 13     Perceived Dyspnea  2     VO2 Peak 7.9     Symptoms No      Resting HR 73 bpm     Resting BP 104/62     Resting Oxygen Saturation  91 %     Exercise Oxygen Saturation  during 6 min walk 86 %     Max Ex. HR 106 bpm     Max Ex. BP 110/64     2 Minute Post BP 108/62       Interval HR   1 Minute HR 80     2 Minute HR 90     4 Minute HR 64     5 Minute HR 71     6 Minute HR 106     2 Minute Post HR 82     Interval Heart Rate? Yes       Interval Oxygen   Interval Oxygen? Yes     Baseline Oxygen Saturation % 91 %     1 Minute Oxygen Saturation % 93 %     1 Minute Liters of Oxygen 4 L     2 Minute Oxygen Saturation % 93 %     2 Minute Liters of Oxygen 4 L     3 Minute Oxygen Saturation % 91 %     3 Minute Liters of Oxygen 4 L     4 Minute Oxygen Saturation % 89 %     4 Minute Liters of Oxygen 4 L     5 Minute Oxygen Saturation % 87 %     5 Minute Liters of Oxygen 4 L     6 Minute Oxygen Saturation % 86 %     6 Minute Liters of Oxygen 4 L     2 Minute Post Oxygen Saturation % 97 %     2 Minute Post Liters of Oxygen 4 L             Oxygen Initial Assessment:  Oxygen Initial Assessment - 06/14/23 1623       Home Oxygen   Home Oxygen Device Home Concentrator;E-Tanks    Sleep Oxygen Prescription Continuous    Liters per minute 2    Home Exercise Oxygen Prescription Continuous    Liters per minute 4    Home Resting Oxygen Prescription None    Compliance with Home Oxygen Use Yes      Intervention   Short Term Goals --             Oxygen Re-Evaluation:  Oxygen Re-Evaluation     Row Name 06/14/23 1623             Goals/Expected Outcomes   Long  Term Goals Exhibits proper breathing techniques, such as pursed lip breathing or other method taught during program session       Comments Reviewed PLB technique with pt.  Talked about how it works and it's importance in maintaining their exercise saturations.       Goals/Expected Outcomes Short: Become more profiecient at using PLB. Long: Become independent at using PLB.                 Oxygen Discharge (Final Oxygen Re-Evaluation):  Oxygen Re-Evaluation - 06/14/23 1623       Goals/Expected Outcomes   Long  Term Goals Exhibits proper breathing techniques, such  as pursed lip breathing or other method taught during program session    Comments Reviewed PLB technique with pt.  Talked about how it works and it's importance in maintaining their exercise saturations.    Goals/Expected Outcomes Short: Become more profiecient at using PLB. Long: Become independent at using PLB.             Initial Exercise Prescription:  Initial Exercise Prescription - 06/02/23 1500       Date of Initial Exercise RX and Referring Provider   Date 06/02/23    Referring Provider Dr. Pernell Dupre      Oxygen   Oxygen Continuous    Liters 4    Maintain Oxygen Saturation 88% or higher      Recumbant Bike   Level 1    RPM 50    Watts 25    Minutes 15    METs 2.26      NuStep   Level 1    SPM 80    Minutes 15    METs 2.26      Arm Ergometer   Level 1    RPM 30    Minutes 15    METs 2.26      T5 Nustep   Level 1    SPM 80    Minutes 15    METs 2.26      Track   Laps 23    Minutes 15    METs 2.25      Intensity   THRR 40-80% of Max Heartrate 103-133    Ratings of Perceived Exertion 11-13    Perceived Dyspnea 0-4      Resistance Training   Training Prescription Yes    Weight 4 lb    Reps 10-15             Perform Capillary Blood Glucose checks as needed.  Exercise Prescription Changes:   Exercise Prescription Changes     Row Name 06/02/23 1500             Response to Exercise   Blood Pressure (Admit) 104/62       Blood Pressure (Exercise) 110/64       Blood Pressure (Exit) 108/62       Heart Rate (Admit) 73 bpm       Heart Rate (Exercise) 106 bpm       Heart Rate (Exit) 82 bpm       Oxygen Saturation (Admit) 91 %       Oxygen Saturation (Exercise) 86 %       Oxygen Saturation (Exit) 97 %       Rating of Perceived  Exertion (Exercise) 13       Perceived Dyspnea (Exercise) 2       Symptoms none       Comments results         Progression   Average METs 2.26         Interval Training   Interval Training No                Exercise Comments:   Exercise Comments     Row Name 06/14/23 1622           Exercise Comments First full day of exercise!  Patient was oriented to gym and equipment including functions, settings, policies, and procedures.  Patient's individual exercise prescription and treatment plan were reviewed.  All starting workloads were established based on the results of the 6 minute walk  test done at initial orientation visit.  The plan for exercise progression was also introduced and progression will be customized based on patient's performance and goals.                Exercise Goals and Review:   Exercise Goals     Row Name 06/02/23 1550             Exercise Goals   Increase Physical Activity Yes       Intervention Provide advice, education, support and counseling about physical activity/exercise needs.;Develop an individualized exercise prescription for aerobic and resistive training based on initial evaluation findings, risk stratification, comorbidities and participant's personal goals.       Expected Outcomes Short Term: Attend rehab on a regular basis to increase amount of physical activity.;Long Term: Add in home exercise to make exercise part of routine and to increase amount of physical activity.;Long Term: Exercising regularly at least 3-5 days a week.       Increase Strength and Stamina Yes       Intervention Provide advice, education, support and counseling about physical activity/exercise needs.;Develop an individualized exercise prescription for aerobic and resistive training based on initial evaluation findings, risk stratification, comorbidities and participant's personal goals.       Expected Outcomes Short Term: Increase workloads from initial  exercise prescription for resistance, speed, and METs.;Short Term: Perform resistance training exercises routinely during rehab and add in resistance training at home;Long Term: Improve cardiorespiratory fitness, muscular endurance and strength as measured by increased METs and functional capacity ( )       Able to understand and use rate of perceived exertion (RPE) scale Yes       Intervention Provide education and explanation on how to use RPE scale       Expected Outcomes Short Term: Able to use RPE daily in rehab to express subjective intensity level       Able to understand and use Dyspnea scale Yes       Intervention Provide education and explanation on how to use Dyspnea scale       Expected Outcomes Short Term: Able to use Dyspnea scale daily in rehab to express subjective sense of shortness of breath during exertion;Long Term: Able to use Dyspnea scale to guide intensity level when exercising independently       Knowledge and understanding of Target Heart Rate Range (THRR) Yes       Intervention Provide education and explanation of THRR including how the numbers were predicted and where they are located for reference       Expected Outcomes Short Term: Able to state/look up THRR;Short Term: Able to use daily as guideline for intensity in rehab;Long Term: Able to use THRR to govern intensity when exercising independently       Able to check pulse independently Yes       Intervention Provide education and demonstration on how to check pulse in carotid and radial arteries.;Review the importance of being able to check your own pulse for safety during independent exercise       Expected Outcomes Long Term: Able to check pulse independently and accurately;Short Term: Able to explain why pulse checking is important during independent exercise       Understanding of Exercise Prescription Yes       Intervention Provide education, explanation, and written materials on patient's individual exercise  prescription       Expected Outcomes Short Term: Able to explain program exercise prescription;Long Term: Able  to explain home exercise prescription to exercise independently                Exercise Goals Re-Evaluation :  Exercise Goals Re-Evaluation     Row Name 06/14/23 1622             Exercise Goal Re-Evaluation   Comments Reviewed RPE and dyspnea scale, THR and program prescription with pt today.  Pt voiced understanding and was given a copy of goals to take home.       Expected Outcomes Short: Use RPE daily to regulate intensity. Long: Follow program prescription in THR.                Discharge Exercise Prescription (Final Exercise Prescription Changes):  Exercise Prescription Changes - 06/02/23 1500       Response to Exercise   Blood Pressure (Admit) 104/62    Blood Pressure (Exercise) 110/64    Blood Pressure (Exit) 108/62    Heart Rate (Admit) 73 bpm    Heart Rate (Exercise) 106 bpm    Heart Rate (Exit) 82 bpm    Oxygen Saturation (Admit) 91 %    Oxygen Saturation (Exercise) 86 %    Oxygen Saturation (Exit) 97 %    Rating of Perceived Exertion (Exercise) 13    Perceived Dyspnea (Exercise) 2    Symptoms none    Comments results      Progression   Average METs 2.26      Interval Training   Interval Training No             Nutrition:  Target Goals: Understanding of nutrition guidelines, daily intake of sodium 1500mg , cholesterol 200mg , calories 30% from fat and 7% or less from saturated fats, daily to have 5 or more servings of fruits and vegetables.  Education: All About Nutrition: -Group instruction provided by verbal, written material, interactive activities, discussions, models, and posters to present general guidelines for heart healthy nutrition including fat, fiber, MyPlate, the role of sodium in heart healthy nutrition, utilization of the nutrition label, and utilization of this knowledge for meal planning. Follow up email sent as  well. Written material given at graduation. Flowsheet Row Pulmonary Rehab from 06/16/2023 in [facility] Cardiac and Pulmonary Rehab  Education need identified 06/02/23  Date 06/16/23  Educator JG  Instruction Review Code 1- Verbalizes Understanding       Biometrics:  Pre Biometrics - 06/02/23 1550       Pre Biometrics   Height 5\' 8"  (1.727 m)    Weight 158 lb 6.4 oz (71.8 kg)    Waist Circumference 34 inches    Hip Circumference 36.5 inches    Waist to Hip Ratio 0.93 %    BMI (Calculated) 24.09    Single Leg Stand 5.84 seconds              Nutrition Therapy Plan and Nutrition Goals:   Nutrition Assessments:  MEDIFICTS Score Key: >=70 Need to make dietary changes  40-70 Heart Healthy Diet <= 40 Therapeutic Level Cholesterol Diet  Flowsheet Row Pulmonary Rehab from 06/02/2023 in [facility] Cardiac and Pulmonary Rehab  Picture Your Plate Total Score on Admission 59      Picture Your Plate Scores: <84 Unhealthy dietary pattern with much room for improvement. 41-50 Dietary pattern unlikely to meet recommendations for good health and room for improvement. 51-60 More healthful dietary pattern, with some room for improvement.  >60 Healthy dietary pattern, although there may be some specific behaviors that could be  improved.   Nutrition Goals Re-Evaluation:   Nutrition Goals Discharge (Final Nutrition Goals Re-Evaluation):   Psychosocial: Target Goals: Acknowledge presence or absence of significant depression and/or stress, maximize coping skills, provide positive support system. Participant is able to verbalize types and ability to use techniques and skills needed for reducing stress and depression.   Education: Stress, Anxiety, and Depression - Group verbal and visual presentation to define topics covered.  Reviews how body is impacted by stress, anxiety, and depression.  Also discusses healthy ways to reduce stress and to treat/manage anxiety and depression.  Written material  given at graduation. Flowsheet Row Cardiac Rehab from 04/01/2016 in [facility] Cardiac and Pulmonary Rehab  Date 04/01/16  Educator [facility]  Instruction Review Code (retired) 2- meets goals/outcomes       Education: Sleep Hygiene -Provides group verbal and written instruction about how sleep can affect your health.  Define sleep hygiene, discuss sleep cycles and impact of sleep habits. Review good sleep hygiene tips.    Initial Review & Psychosocial Screening:  Initial Psych Review & Screening - 05/31/23 0942       Initial Review   Current issues with None Identified      Family Dynamics   Good Support System? Yes    Comments His wife and two children are a good support system for him. He has five grand kids. He is more angry at the fact that his disease does not let him do the things he wants to do.      Barriers   Psychosocial barriers to participate in program The patient should benefit from training in stress management and relaxation.;There are no identifiable barriers or psychosocial needs.      Screening Interventions   Interventions Encouraged to exercise;Provide feedback about the scores to participant;To provide support and resources with identified psychosocial needs    Expected Outcomes Short Term goal: Utilizing psychosocial counselor, staff and physician to assist with identification of specific Stressors or current issues interfering with healing process. Setting desired goal for each stressor or current issue identified.;Long Term Goal: Stressors or current issues are controlled or eliminated.;Short Term goal: Identification and review with participant of any Quality of Life or Depression concerns found by scoring the questionnaire.;Long Term goal: The participant improves quality of Life and PHQ9 Scores as seen by post scores and/or verbalization of changes             Quality of Life Scores:  Scores of 19 and below usually indicate a poorer quality of life in these areas.   A difference of  2-3 points is a clinically meaningful difference.  A difference of 2-3 points in the total score of the Quality of Life Index has been associated with significant improvement in overall quality of life, self-image, physical symptoms, and general health in studies assessing change in quality of life.  PHQ-9: Review Flowsheet  More data exists      06/02/2023 01/05/2022 12/12/2021 09/29/2021 03/25/2016  Depression screen PHQ 2/9  Decreased Interest 0 3 2 0 0  Down, Depressed, Hopeless 0 2 1 1  0  PHQ - 2 Score 0 5 3 1  0  Altered sleeping 0 1 3 3 1   Tired, decreased energy 0 3 1 2  0  Change in appetite 0 3 1 1  0  Feeling bad or failure about yourself  0 2 0 1 0  Trouble concentrating 0 1 0 0 0  Moving slowly or fidgety/restless 0 1 0 0 0  Suicidal thoughts 0  0 0 0 0  PHQ-9 Score 0 16 8 8 1   Difficult doing work/chores Not difficult at all Not difficult at all Somewhat difficult Somewhat difficult Not difficult at all   Interpretation of Total Score  Total Score Depression Severity:  1-4 = Minimal depression, 5-9 = Mild depression, 10-14 = Moderate depression, 15-19 = Moderately severe depression, 20-27 = Severe depression   Psychosocial Evaluation and Intervention:  Psychosocial Evaluation - 05/31/23 0944       Psychosocial Evaluation & Interventions   Interventions Encouraged to exercise with the program and follow exercise prescription;Relaxation education;Stress management education    Comments His wife and two children are a good support system for him. He has five grand kids. He is more angry at the fact that his disease does not let him do the things he wants to do.    Expected Outcomes Short: Attend [facility] stress management education to decrease stress. Long: Maintain exercise Post [facility] to keep stress at a minimum.    Continue Psychosocial Services  Follow up required by staff             Psychosocial Re-Evaluation:   Psychosocial Discharge (Final  Psychosocial Re-Evaluation):   Education: Education Goals: Education classes will be provided on a weekly basis, covering required topics. Participant will state understanding/return demonstration of topics presented.  Learning Barriers/Preferences:  Learning Barriers/Preferences - 05/31/23 0940       Learning Barriers/Preferences   Learning Barriers Sight    Learning Preferences None             General Pulmonary Education Topics:  Infection Prevention: - Provides verbal and written material to individual with discussion of infection control including proper hand washing and proper equipment cleaning during exercise session. Flowsheet Row Pulmonary Rehab from 06/16/2023 in [facility] Rehab  Date 06/02/23  Educator [facility]  Instruction Review Code 1- Verbalizes Understanding       Falls Prevention: - Provides verbal and written material to individual with discussion of falls prevention and safety. Flowsheet Row Pulmonary Rehab from 06/16/2023 in [facility] Cardiac and Pulmonary Rehab  Date 06/02/23  Educator [facility] [place] [facility]  Instruction Review Code 1- Verbalizes Understanding       Chronic Lung Disease Review: - Group verbal instruction with posters, models, PowerPoint presentations and videos,  to review new updates, new respiratory medications, new advancements in procedures and treatments. Providing information on websites and "800" numbers for continued self-education. Includes information about supplement oxygen, available portable oxygen systems, continuous and intermittent flow rates, oxygen safety, concentrators, and Medicare reimbursement for oxygen. Explanation of Pulmonary Drugs, including class, frequency, complications, importance of spacers, rinsing mouth after steroid MDI's, and proper cleaning methods for nebulizers. Review of basic lung anatomy and physiology related to function, structure, and complications of lung disease. Review of risk factors. Discussion about  methods for diagnosing sleep apnea and types of masks and machines for OSA. Includes a review of the use of types of environmental controls: home humidity, furnaces, filters, dust mite/pet prevention, HEPA vacuums. Discussion about weather changes, air quality and the benefits of nasal washing. Instruction on Warning signs, infection symptoms, calling MD promptly, preventive modes, and value of vaccinations. Review of effective airway clearance, coughing and/or vibration techniques. Emphasizing that all should Create an Action Plan. Written material given at graduation. Flowsheet Row Pulmonary Rehab from 12/17/2021 in [facility] Cardiac and Pulmonary Rehab  Education need identified 09/29/21  Date 11/06/21  Educator [facility]  Instruction Review Code 1- Verbalizes Understanding  AED/CPR: - Group verbal and written instruction with the use of models to demonstrate the basic use of the AED with the basic ABC's of resuscitation.    Anatomy and Cardiac Procedures: - Group verbal and visual presentation and models provide information about basic cardiac anatomy and function. Reviews the testing methods done to diagnose heart disease and the outcomes of the test results. Describes the treatment choices: Medical Management, Angioplasty, or Coronary Bypass Surgery for treating various heart conditions including Myocardial Infarction, Angina, Valve Disease, and Cardiac Arrhythmias.  Written material given at graduation. Flowsheet Row Cardiac Rehab from 04/01/2016 in [facility] Cardiac and Pulmonary Rehab  Date 02/24/16  Educator TS  Instruction Review Code (retired) 2- meets goals/outcomes       Medication Safety: - Group verbal and visual instruction to review commonly prescribed medications for heart and lung disease. Reviews the medication, class of the drug, and side effects. Includes the steps to properly store meds and maintain the prescription regimen.  Written material given at graduation. Flowsheet Row  Cardiac Rehab from 04/01/2016 in [facility] Cardiac and Pulmonary Rehab  Date 03/11/16  [name] 2]  Educator TS  Instruction Review Code (retired) 2- meets goals/outcomes       Other: -Provides group and verbal instruction on various topics (see comments)   Knowledge Questionnaire Score:  Knowledge Questionnaire Score - 06/02/23 1555       Knowledge Questionnaire Score   Pre Score 25              Core Components/Risk Factors/Patient Goals at Admission:  Personal Goals and Risk Factors at Admission - 05/31/23 0940       Core Components/Risk Factors/Patient Goals on Admission    Weight Management Yes;Weight Maintenance    Intervention Weight Management: Develop a combined nutrition and exercise program designed to reach desired caloric intake, while maintaining appropriate intake of nutrient and fiber, sodium and fats, and appropriate energy expenditure required for the weight goal.;Weight Management: Provide education and appropriate resources to help participant work on and attain dietary goals.;Weight Management/Obesity: Establish reasonable short term and long term weight goals.    Expected Outcomes Short Term: Continue to assess and modify interventions until short term weight is achieved;Understanding recommendations for meals to include 15-35% energy as protein, 25-35% energy from fat, 35-60% energy from carbohydrates, less than 200mg  of dietary cholesterol, 20-35 gm of total fiber daily;Understanding of distribution of calorie intake throughout the day with the consumption of 4-5 meals/snacks;Long Term: Adherence to nutrition and physical activity/exercise program aimed toward attainment of established weight goal;Weight Maintenance: Understanding of the daily nutrition guidelines, which includes 25-35% calories from fat, 7% or less cal from saturated fats, less than 200mg  cholesterol, less than 1.5gm of sodium, & 5 or more servings of fruits and vegetables daily    Improve shortness of  breath with ADL's Yes    Intervention Provide education, individualized exercise plan and daily activity instruction to help decrease symptoms of SOB with activities of daily living.    Expected Outcomes Short Term: Improve cardiorespiratory fitness to achieve a reduction of symptoms when performing ADLs    Diabetes Yes    Intervention Provide education about signs/symptoms and action to take for hypo/hyperglycemia.;Provide education about proper nutrition, including hydration, and aerobic/resistive exercise prescription along with prescribed medications to achieve blood glucose in normal ranges: Fasting glucose 65-99 mg/dL    Expected Outcomes Short Term: Participant verbalizes understanding of the signs/symptoms and immediate care of hyper/hypoglycemia, proper foot care and importance of medication, aerobic/resistive  exercise and nutrition plan for blood glucose control.;Long Term: Attainment of HbA1C < 7%.    Hypertension Yes    Intervention Provide education on lifestyle modifcations including regular physical activity/exercise, weight management, moderate sodium restriction and increased consumption of fresh fruit, vegetables, and low fat dairy, alcohol moderation, and smoking cessation.;Monitor prescription use compliance.    Expected Outcomes Short Term: Continued assessment and intervention until BP is < 140/38mm HG in hypertensive participants. < 130/97mm HG in hypertensive participants with diabetes, heart failure or chronic kidney disease.;Long Term: Maintenance of blood pressure at goal levels.    Lipids Yes    Intervention Provide education and support for participant on nutrition & aerobic/resistive exercise along with prescribed medications to achieve LDL 70mg , HDL >40mg .    Expected Outcomes Short Term: Participant states understanding of desired cholesterol values and is compliant with medications prescribed. Participant is following exercise prescription and nutrition guidelines.;Long  Term: Cholesterol controlled with medications as prescribed, with individualized exercise RX and with personalized nutrition plan. Value goals: LDL < 70mg , HDL > 40 mg.             Education:Diabetes - Individual verbal and written instruction to review signs/symptoms of diabetes, desired ranges of glucose level fasting, after meals and with exercise. Acknowledge that pre and post exercise glucose checks will be done for 3 sessions at entry of program. Flowsheet Row Pulmonary Rehab from 06/16/2023 in [facility] Cardiac and Pulmonary Rehab  Date 06/02/23  Educator [facility]  Instruction Review Code 1- Verbalizes Understanding       Know Your Numbers and Heart Failure: - Group verbal and visual instruction to discuss disease risk factors for cardiac and pulmonary disease and treatment options.  Reviews associated critical values for Overweight/Obesity, Hypertension, Cholesterol, and Diabetes.  Discusses basics of heart failure: signs/symptoms and treatments.  Introduces Heart Failure Zone chart for action plan for heart failure.  Written material given at graduation. Flowsheet Row Pulmonary Rehab from 12/17/2021 in [facility] Cardiac and Pulmonary Rehab  Date 10/29/21  Educator SB  Instruction Review Code 1- Verbalizes Understanding       Core Components/Risk Factors/Patient Goals Review:    Core Components/Risk Factors/Patient Goals at Discharge (Final Review):    ITP Comments:  ITP Comments     Row Name 05/31/23 1610 06/02/23 1544 06/14/23 1622 06/30/23 0950     ITP Comments Virtual Visit completed. Patient informed on EP and RD appointment and 6 Minute walk test. Patient also informed of patient health questionnaires on My Chart. Patient Verbalizes understanding. Visit diagnosis can be found in [facility] media, patient is [place]. Completed and gym orientation. Initial ITP created and sent for review to Dr. Jinny Sanders, Medical Director. First full day of exercise!  Patient was oriented to gym and  equipment including functions, settings, policies, and procedures.  Patient's individual exercise prescription and treatment plan were reviewed.  All starting workloads were established based on the results of the 6 minute walk test done at initial orientation visit.  The plan for exercise progression was also introduced and progression will be customized based on patient's performance and goals. 30 Day review completed. Medical Director ITP review done, changes made as directed, and signed approval by Medical Director.    new to program             Comments:

## 2023-07-02 ENCOUNTER — Encounter: Admitting: *Deleted

## 2023-07-02 DIAGNOSIS — J849 Interstitial pulmonary disease, unspecified: Secondary | ICD-10-CM | POA: Diagnosis not present

## 2023-07-02 NOTE — Progress Notes (Signed)
 Daily Session Note  Patient Details  Name: Brian Casey MRN: [id number] Date of Birth: Jan 23, 1951 Referring Provider:   Flowsheet Row Pulmonary Rehab from 06/02/2023 in [facility] Cardiac and Pulmonary Rehab  Referring Provider Dr. Pernell Dupre       Encounter Date: 07/02/2023  Check In:  Session Check In - 07/02/23 1123       Check-In   Supervising physician immediately available to respond to emergencies See telemetry face sheet for immediately available ER MD    Location [facility]-Cardiac & Pulmonary Rehab    Staff Present Maxon Conetta BS, Exercise Physiologist;Noah Tickle, BS, Exercise Physiologist;Joseph Hood RCP,RRT,BSRT;Cora Collum, RN, BSN, CCRP    Virtual Visit No    Medication changes reported     No    Fall or balance concerns reported    No    Warm-up and Cool-down Performed on first and last piece of equipment    Resistance Training Performed Yes    VAD Patient? No    PAD/SET Patient? No      Pain Assessment   Currently in Pain? No/denies                Social History   Tobacco Use  Smoking Status Former   Current packs/day: 0.00   Average packs/day: 1 pack/day for 48.0 years (48.0 ttl pk-yrs)   Types: Cigarettes   Start date: 11/07/1967   Quit date: 11/07/2015   Years since quitting: 7.6  Smokeless Tobacco Never    Goals Met:  Proper associated with RPD/PD & O2 Sat Independence with exercise equipment Exercise tolerated well No report of concerns or symptoms today  Goals Unmet:  Not Applicable  Comments: Pt able to follow exercise prescription today without complaint.  Will continue to monitor for progression.    Dr. Bethann Punches is Medical Director for [facility] Cardiac Rehabilitation.  Dr. Vida Rigger is Medical Director for [facility] Pulmonary Rehabilitation.

## 2023-07-05 ENCOUNTER — Encounter

## 2023-07-07 ENCOUNTER — Encounter: Admitting: *Deleted

## 2023-07-07 DIAGNOSIS — J849 Interstitial pulmonary disease, unspecified: Secondary | ICD-10-CM | POA: Diagnosis not present

## 2023-07-07 NOTE — Progress Notes (Signed)
 Daily Session Note  Patient Details  Name: Brian Casey MRN: [id number] Date of Birth: November 12, 1950 Referring Provider:   Flowsheet Row Pulmonary Rehab from 06/02/2023 in [facility] Cardiac and Pulmonary Rehab  Referring Provider Dr. Alphonso Aschoff       Encounter Date: 07/07/2023  Check In:  Session Check In - 07/07/23 1155       Check-In   Supervising physician immediately available to respond to emergencies See telemetry face sheet for immediately available ER MD    Location [facility]-Cardiac & Pulmonary Rehab    Staff Present Lyell Samuel, MS, Exercise Physiologist;Maxon Beauford Bounds, Exercise Physiologist;Meredith Manson Seitz RN,BSN;Brizza Nathanson, RN, BSN, CCRP    Virtual Visit No    Medication changes reported     No    Fall or balance concerns reported    No    Warm-up and Cool-down Performed on first and last piece of equipment    Resistance Training Performed Yes    VAD Patient? No    PAD/SET Patient? No      Pain Assessment   Currently in Pain? No/denies                Social History   Tobacco Use  Smoking Status Former   Current packs/day: 0.00   Average packs/day: 1 pack/day for 48.0 years (48.0 ttl pk-yrs)   Types: Cigarettes   Start date: 11/07/1967   Quit date: 11/07/2015   Years since quitting: 7.6  Smokeless Tobacco Never    Goals Met:  Proper associated with RPD/PD & O2 Sat Independence with exercise equipment Exercise tolerated well No report of concerns or symptoms today  Goals Unmet:  Not Applicable  Comments: Pt able to follow exercise prescription today without complaint.  Will continue to monitor for progression.    Dr. Firman Hughes is Medical Director for [facility] Cardiac Rehabilitation.  Dr. Fuad Aleskerov is Medical Director for [facility] Pulmonary Rehabilitation.

## 2023-07-09 ENCOUNTER — Encounter: Admitting: *Deleted

## 2023-07-09 DIAGNOSIS — J849 Interstitial pulmonary disease, unspecified: Secondary | ICD-10-CM

## 2023-07-09 NOTE — Progress Notes (Signed)
 Daily Session Note  Patient Details  Name: Brian Casey MRN: [id number] Date of Birth: July 17, 1950 Referring Provider:   Flowsheet Row Pulmonary Rehab from 06/02/2023 in [facility] Cardiac and Pulmonary Rehab  Referring Provider Dr. Norleen Dudley       Encounter Date: 07/09/2023  Check In:  Session Check In - 07/09/23 1125       Check-In   Supervising physician immediately available to respond to emergencies See telemetry face sheet for immediately available ER MD    Location [facility]-Cardiac & Pulmonary Rehab    Staff Present Bruno Mirza RN,BSN;Maxon Burnell BS, Exercise Physiologist;Kelly Dyane HECKLE, ACSM CEP, Exercise Physiologist;Noah Tickle, BS, Exercise Physiologist    Virtual Visit No    Medication changes reported     No    Fall or balance concerns reported    No    Warm-up and Cool-down Performed on first and last piece of equipment    Resistance Training Performed Yes    VAD Patient? No    PAD/SET Patient? No      Pain Assessment   Currently in Pain? No/denies                Social History   Tobacco Use  Smoking Status Former   Current packs/day: 0.00   Average packs/day: 1 pack/day for 48.0 years (48.0 ttl pk-yrs)   Types: Cigarettes   Start date: 11/07/1967   Quit date: 11/07/2015   Years since quitting: 7.6  Smokeless Tobacco Never    Goals Met:  Independence with exercise equipment Exercise tolerated well No report of concerns or symptoms today Strength training completed today  Goals Unmet:  Not Applicable  Comments: Pt able to follow exercise prescription today without complaint.  Will continue to monitor for progression.    Dr. Oneil Pinal is Medical Director for [facility] Cardiac Rehabilitation.  Dr. Fuad Aleskerov is Medical Director for [facility] Pulmonary Rehabilitation.

## 2023-07-12 ENCOUNTER — Encounter: Admitting: *Deleted

## 2023-07-12 DIAGNOSIS — J849 Interstitial pulmonary disease, unspecified: Secondary | ICD-10-CM | POA: Diagnosis not present

## 2023-07-12 NOTE — Progress Notes (Signed)
 Daily Session Note  Patient Details  Name: Brian Casey MRN: [id number] Date of Birth: April 10, 1950 Referring Provider:   Flowsheet Row Pulmonary Rehab from 06/02/2023 in [facility] Cardiac and Pulmonary Rehab  Referring Provider Dr. Alphonso Aschoff       Encounter Date: 07/12/2023  Check In:  Session Check In - 07/12/23 1116       Check-In   Supervising physician immediately available to respond to emergencies See telemetry face sheet for immediately available ER MD    Location [facility]-Cardiac & Pulmonary Rehab    Staff Present Maud Sorenson, RN, BSN, CCRP;Joseph Hood RCP,RRT,BSRT;Maxon [employer], Exercise Physiologist;Kelly [employer], ACSM CEP, Exercise Physiologist    Virtual Visit No    Medication changes reported     No    Fall or balance concerns reported    No    Warm-up and Cool-down Performed on first and last piece of equipment    Resistance Training Performed Yes    VAD Patient? No    PAD/SET Patient? No      Pain Assessment   Currently in Pain? No/denies                Social History   Tobacco Use  Smoking Status Former   Current packs/day: 0.00   Average packs/day: 1 pack/day for 48.0 years (48.0 ttl pk-yrs)   Types: Cigarettes   Start date: 11/07/1967   Quit date: 11/07/2015   Years since quitting: 7.6  Smokeless Tobacco Never    Goals Met:  Proper associated with RPD/PD & O2 Sat Independence with exercise equipment Exercise tolerated well No report of concerns or symptoms today  Goals Unmet:  Not Applicable  Comments: Pt able to follow exercise prescription today without complaint.  Will continue to monitor for progression.    Dr. Firman Hughes is Medical Director for [facility] Cardiac Rehabilitation.  Dr. Fuad Aleskerov is Medical Director for [facility] Pulmonary Rehabilitation.

## 2023-07-12 NOTE — Progress Notes (Deleted)
 Cardiac Individual Treatment Plan  Patient Details  Name: Brian Casey MRN: [id number] Date of Birth: [birth year] Referring Provider:   Flowsheet Row Pulmonary Rehab from 06/02/2023 in [facility] Cardiac and Pulmonary Rehab  Referring Provider Dr. Alphonso Aschoff       Initial Encounter Date:  Flowsheet Row Pulmonary Rehab from 06/02/2023 in [facility] Cardiac and Pulmonary Rehab  Date 06/02/23       Visit Diagnosis: ILD (interstitial lung disease) (HCC)  Patient's Home Medications on Admission:  Current Outpatient Medications:    aspirin  EC 81 MG tablet, Take 81 mg by mouth daily. Swallow whole., Disp: , Rfl:    atorvastatin  (LIPITOR) 40 MG tablet, Take 1 tablet (40 mg total) by mouth daily., Disp: 90 tablet, Rfl: 3   carvedilol  (COREG ) 12.5 MG tablet, Take 1 tablet (12.5 mg total) by mouth 2 (two) times daily with a meal., Disp: 180 tablet, Rfl: 3   empagliflozin (JARDIANCE) 10 MG TABS tablet, Take 25 mg by mouth daily., Disp: , Rfl:    ezetimibe  (ZETIA ) 10 MG tablet, Take 1 tablet (10 mg total) by mouth daily., Disp: 90 tablet, Rfl: 3   furosemide  (LASIX ) 20 MG tablet, Take 1 tablet (20 mg total) by mouth daily., Disp: 90 tablet, Rfl: 3   glucose blood test strip, Use to test blood sugar twice a day, Disp: 200 each, Rfl: 3   levothyroxine  (SYNTHROID ) 100 MCG tablet, Take 1 tablet (100 mcg total) by mouth, on an empty stomach, daily with a glass of water at least 30-60 minutes before breakfast., Disp: 90 tablet, Rfl: 3   loperamide (IMODIUM) 2 MG capsule, Take 2 mg by mouth as needed for diarrhea or loose stools. (Patient not taking: Reported on 05/31/2023), Disp: , Rfl:    NEOMYCIN -POLYMYXIN-HYDROCORTISONE (CORTISPORIN) 1 % SOLN OTIC solution, Apply 1-2 drops to toe BID after soaking, Disp: 10 mL, Rfl: 1   omeprazole  (PRILOSEC) 20 MG capsule, Take 1 capsule (20 mg total) by mouth daily., Disp: 90 capsule, Rfl: 3   ondansetron  (ZOFRAN ) 4 MG tablet, Take 1 tablet (4 mg total) by mouth every 8  (eight) hours as needed for nausea and/or vomitting. (Patient not taking: Reported on 05/31/2023), Disp: 20 tablet, Rfl: 3   OXYGEN, Inhale 4 L into the lungs daily., Disp: , Rfl:    Pirfenidone  801 MG TABS, Take 1 tablet (801 mg total) by mouth in the morning and at bedtime. **low dose as maintenance**, Disp: 180 tablet, Rfl: 1   PRECISION QID TEST test strip, Use 2 (two) times daily, Disp: , Rfl:    predniSONE  (DELTASONE ) 10 MG tablet, Take 1 tablet (10 mg total) by mouth daily with breakfast., Disp: 90 tablet, Rfl: 1   tadalafil  (CIALIS ) 10 MG tablet, Take 1 tablet (10 mg total) by mouth daily as needed for erectile dysfunction., Disp: 90 tablet, Rfl: 3  Past Medical History: Past Medical History:  Diagnosis Date   Adenomatous colon polyp    Aortic root dilatation (HCC)    Noted on echo in August 2023.  Measured 39 mm.   Arthritis    "lower spine; fingers" (11/07/2015)   CAD (coronary artery disease)    a. 10/2015 NSTEMI/Cath: LM 40, LAD 70p, LCX 90ost, RI 90ost, RCA 60-70p, 90-39m/d, 40-50d; b. 10/2015 CABG x 4: LIMA->LAD, VG->Diag, VG->RI, VG->RPDA.   Chronic respiratory failure with hypoxia (HCC)    CKD (chronic kidney disease), stage III (HCC)    Diastolic dysfunction    a. 10/2021 Echo: EF 60-65%,   Diverticulosis  GERD (gastroesophageal reflux disease)    Grade I diastolic dysfunction    High cholesterol    History of bronchitis    Hypertension 10/17/2013   Hypothyroidism    ILD (interstitial lung disease) (HCC)    Mild pulmonary hypertension (HCC)    PAD (peripheral artery disease) (HCC)    s/p left carotid endarterectomy   Post-op atrial fibrillation (HCC) 11/06/2015   Shortness of breath    Type II diabetes mellitus (HCC)    type II   Wears glasses     Tobacco Use: Social History   Tobacco Use  Smoking Status Former   Current packs/day: 0.00   Average packs/day: 1 pack/day for 48.0 years (48.0 ttl pk-yrs)   Types: Cigarettes   Start date: 11/07/1967   Quit  date: 11/07/2015   Years since quitting: 7.6  Smokeless Tobacco Never    Labs: Review Flowsheet  More data exists      Latest Ref Rng & Units 11/14/2015 11/15/2015 01/15/2016 02/03/2022 05/25/2023  Labs for ITP Cardiac and Pulmonary Rehab  Cholestrol 100 - 199 mg/dL - - - - 829   LDL (calc) 0 - 99 mg/dL - - - - 88   HDL-C >56 mg/dL - - - - 59   Trlycerides 0 - 149 mg/dL - - - - 213   Hemoglobin A1c 4.8 - 5.6 % - - 5.5  - -  PH, Arterial 7.350 - 7.450 7.361  7.308  7.366  7.421  7.352  7.361  - - -  PCO2 arterial 35.0 - 45.0 mmHg 40.3  52.3  38.2  39.5  38.4  39.3  - - -  Bicarbonate 20.0 - 28.0 mmol/L 23.0  26.3  21.9  25.7  21.4  22.5  - 26.2  -  TCO2 22 - 32 mmol/L 24  23  28  23  22  24  24  27  26  26  25  28  25  22  23  24   - 27  -  Acid-base deficit 0.0 - 2.0 mmol/L 2.0  3.0  4.0  3.0  - - -  O2 Saturation % 97.0  100.0  99.0  100.0  61.7  95.0  98.0  - 72  -    Details       Multiple values from one day are sorted in reverse-chronological order          Exercise Target Goals: Exercise Program Goal: Individual exercise prescription set using results from initial 6 min walk test and THRR while considering  patient's activity barriers and safety.   Exercise Prescription Goal: Initial exercise prescription builds to 30-45 minutes a day of aerobic activity, 2-3 days per week.  Home exercise guidelines will be given to patient during program as part of exercise prescription that the participant will acknowledge.   Education: Aerobic Exercise: - Group verbal and visual presentation on the components of exercise prescription. Introduces F.I.T.T principle from ACSM for exercise prescriptions.  Reviews F.I.T.T. principles of aerobic exercise including progression. Written material given at graduation. Flowsheet Row Cardiac Rehab from 04/01/2016 in [facility] Cardiac and Pulmonary Rehab  Date 02/17/16  Educator [facility]  Instruction Review Code (retired) 2- meets goals/outcomes        Education: Resistance Exercise: - Group verbal and visual presentation on the components of exercise prescription. Introduces F.I.T.T principle from ACSM for exercise prescriptions  Reviews F.I.T.T. principles of resistance exercise including progression. Written material given at graduation. Flowsheet Row Pulmonary Rehab from  12/17/2021 in [facility] Cardiac and Pulmonary Rehab  Date 12/03/21  Educator NT  Instruction Review Code 1- Verbalizes Understanding        Education: Exercise & Equipment Safety: - Individual verbal instruction and demonstration of equipment use and safety with use of the equipment. Flowsheet Row Pulmonary Rehab from 06/16/2023 in [facility] Cardiac and Pulmonary Rehab  Date 06/02/23  Educator [facility]  Instruction Review Code 1- Verbalizes Understanding       Education: Exercise Physiology & General Exercise Guidelines: - Group verbal and written instruction with models to review the exercise physiology of the cardiovascular system and associated critical values. Provides general exercise guidelines with specific guidelines to those with heart or lung disease.    Education: Flexibility, Balance, Mind/Body Relaxation: - Group verbal and visual presentation with interactive activity on the components of exercise prescription. Introduces F.I.T.T principle from ACSM for exercise prescriptions. Reviews F.I.T.T. principles of flexibility and balance exercise training including progression. Also discusses the mind body connection.  Reviews various relaxation techniques to help reduce and manage stress (i.e. Deep breathing, progressive muscle relaxation, and visualization). Balance handout provided to take home. Written material given at graduation. Flowsheet Row Pulmonary Rehab from 12/17/2021 in [facility] Cardiac and Pulmonary Rehab  Date 12/03/21  Educator NT  Instruction Review Code 1- Verbalizes Understanding       Activity Barriers & Risk Stratification:  Activity Barriers &  Cardiac Risk Stratification - 06/02/23 1544       Activity Barriers & Cardiac Risk Stratification   Activity Barriers Deconditioning;Shortness of Breath;Balance Concerns             6 Minute Walk:  6 Minute Walk     Row Name 06/02/23 1540         6 Minute Walk   Phase Initial     Distance 900 feet     Walk Time 6 minutes     # of Rest Breaks 0     MPH 1.7     METS 2.26     RPE 13     Perceived Dyspnea  2     VO2 Peak 7.9     Symptoms No     Resting HR 73 bpm     Resting BP 104/62     Resting Oxygen Saturation  91 %     Exercise Oxygen Saturation  during 6 min walk 86 %     Max Ex. HR 106 bpm     Max Ex. BP 110/64     2 Minute Post BP 108/62       Interval HR   1 Minute HR 80     2 Minute HR 90     4 Minute HR 64     5 Minute HR 71     6 Minute HR 106     2 Minute Post HR 82     Interval Heart Rate? Yes       Interval Oxygen   Interval Oxygen? Yes     Baseline Oxygen Saturation % 91 %     1 Minute Oxygen Saturation % 93 %     1 Minute Liters of Oxygen 4 L     2 Minute Oxygen Saturation % 93 %     2 Minute Liters of Oxygen 4 L     3 Minute Oxygen Saturation % 91 %     3 Minute Liters of Oxygen 4 L     4 Minute Oxygen Saturation % 89 %  4 Minute Liters of Oxygen 4 L     5 Minute Oxygen Saturation % 87 %     5 Minute Liters of Oxygen 4 L     6 Minute Oxygen Saturation % 86 %     6 Minute Liters of Oxygen 4 L     2 Minute Post Oxygen Saturation % 97 %     2 Minute Post Liters of Oxygen 4 L              Oxygen Initial Assessment:  Oxygen Initial Assessment - 06/14/23 1623       Home Oxygen   Home Oxygen Device Home Concentrator;E-Tanks    Sleep Oxygen Prescription Continuous    Liters per minute 2    Home Exercise Oxygen Prescription Continuous    Liters per minute 4    Home Resting Oxygen Prescription None    Compliance with Home Oxygen Use Yes      Intervention   Short Term Goals --             Oxygen Re-Evaluation:  Oxygen  Re-Evaluation     Row Name 06/14/23 1623             Goals/Expected Outcomes   Long  Term Goals Exhibits proper breathing techniques, such as pursed lip breathing or other method taught during program session       Comments Reviewed PLB technique with pt.  Talked about how it works and it's importance in maintaining their exercise saturations.       Goals/Expected Outcomes Short: Become more profiecient at using PLB. Long: Become independent at using PLB.                Oxygen Discharge (Final Oxygen Re-Evaluation):  Oxygen Re-Evaluation - 06/14/23 1623       Goals/Expected Outcomes   Long  Term Goals Exhibits proper breathing techniques, such as pursed lip breathing or other method taught during program session    Comments Reviewed PLB technique with pt.  Talked about how it works and it's importance in maintaining their exercise saturations.    Goals/Expected Outcomes Short: Become more profiecient at using PLB. Long: Become independent at using PLB.             Initial Exercise Prescription:  Initial Exercise Prescription - 06/02/23 1500       Date of Initial Exercise RX and Referring Provider   Date 06/02/23    Referring Provider Dr. Alphonso Aschoff      Oxygen   Oxygen Continuous    Liters 4    Maintain Oxygen Saturation 88% or higher      Recumbant Bike   Level 1    RPM 50    Watts 25    Minutes 15    METs 2.26      NuStep   Level 1    SPM 80    Minutes 15    METs 2.26      Arm Ergometer   Level 1    RPM 30    Minutes 15    METs 2.26      T5 Nustep   Level 1    SPM 80    Minutes 15    METs 2.26      Track   Laps 23    Minutes 15    METs 2.25      Intensity   THRR 40-80% of Max Heartrate 103-133    Ratings of Perceived Exertion 11-13  Perceived Dyspnea 0-4      Resistance Training   Training Prescription Yes    Weight 4 lb    Reps 10-15             Perform Capillary Blood Glucose checks as needed.  Exercise  Prescription Changes:   Exercise Prescription Changes     Row Name 06/02/23 1500 06/30/23 1500           Response to Exercise   Blood Pressure (Admit) 104/62 94/54      Blood Pressure (Exercise) 110/64 138/70      Blood Pressure (Exit) 108/62 96/50      Heart Rate (Admit) 73 bpm 70 bpm      Heart Rate (Exercise) 106 bpm 98 bpm      Heart Rate (Exit) 82 bpm 84 bpm      Oxygen Saturation (Admit) 91 % 98 %      Oxygen Saturation (Exercise) 86 % 92 %      Oxygen Saturation (Exit) 97 % 97 %      Rating of Perceived Exertion (Exercise) 13 15      Perceived Dyspnea (Exercise) 2 3      Symptoms none none      Comments results first 2weeks of exercise      Duration -- Progress to 30 minutes of  aerobic without signs/symptoms of physical distress      Intensity -- THRR unchanged        Progression   Progression -- Continue to progress workloads to maintain intensity without signs/symptoms of physical distress.      Average METs 2.26 2.45        Resistance Training   Training Prescription -- Yes      Weight -- 4lb      Reps -- 10-15        Interval Training   Interval Training No No        Oxygen   Oxygen -- Continuous      Liters -- 4        Recumbant Bike   Level -- 2      Watts -- 25      Minutes -- 15      METs -- 3.1        NuStep   Level -- 3      Minutes -- 15      METs -- 2.9        T5 Nustep   Level -- 2      Minutes -- 15      METs -- 1.8        Biostep-RELP   Level -- 2      Minutes -- 15      METs -- 3        Track   Laps -- 9      Minutes -- 15      METs -- 1.49        Oxygen   Maintain Oxygen Saturation -- 88% or higher               Exercise Comments:   Exercise Comments     Row Name 06/14/23 1622           Exercise Comments First full day of exercise!  Patient was oriented to gym and equipment including functions, settings, policies, and procedures.  Patient's individual exercise prescription and treatment plan were  reviewed.  All starting workloads were established based on the results of  the 6 minute walk test done at initial orientation visit.  The plan for exercise progression was also introduced and progression will be customized based on patient's performance and goals.                Exercise Goals and Review:   Exercise Goals     Row Name 06/02/23 1550             Exercise Goals   Increase Physical Activity Yes       Intervention Provide advice, education, support and counseling about physical activity/exercise needs.;Develop an individualized exercise prescription for aerobic and resistive training based on initial evaluation findings, risk stratification, comorbidities and participant's personal goals.       Expected Outcomes Short Term: Attend rehab on a regular basis to increase amount of physical activity.;Long Term: Add in home exercise to make exercise part of routine and to increase amount of physical activity.;Long Term: Exercising regularly at least 3-5 days a week.       Increase Strength and Stamina Yes       Intervention Provide advice, education, support and counseling about physical activity/exercise needs.;Develop an individualized exercise prescription for aerobic and resistive training based on initial evaluation findings, risk stratification, comorbidities and participant's personal goals.       Expected Outcomes Short Term: Increase workloads from initial exercise prescription for resistance, speed, and METs.;Short Term: Perform resistance training exercises routinely during rehab and add in resistance training at home;Long Term: Improve cardiorespiratory fitness, muscular endurance and strength as measured by increased METs and functional capacity ( )       Able to understand and use rate of perceived exertion (RPE) scale Yes       Intervention Provide education and explanation on how to use RPE scale       Expected Outcomes Short Term: Able to use RPE daily in rehab to  express subjective intensity level       Able to understand and use Dyspnea scale Yes       Intervention Provide education and explanation on how to use Dyspnea scale       Expected Outcomes Short Term: Able to use Dyspnea scale daily in rehab to express subjective sense of shortness of breath during exertion;Long Term: Able to use Dyspnea scale to guide intensity level when exercising independently       Knowledge and understanding of Target Heart Rate Range (THRR) Yes       Intervention Provide education and explanation of THRR including how the numbers were predicted and where they are located for reference       Expected Outcomes Short Term: Able to state/look up THRR;Short Term: Able to use daily as guideline for intensity in rehab;Long Term: Able to use THRR to govern intensity when exercising independently       Able to check pulse independently Yes       Intervention Provide education and demonstration on how to check pulse in carotid and radial arteries.;Review the importance of being able to check your own pulse for safety during independent exercise       Expected Outcomes Long Term: Able to check pulse independently and accurately;Short Term: Able to explain why pulse checking is important during independent exercise       Understanding of Exercise Prescription Yes       Intervention Provide education, explanation, and written materials on patient's individual exercise prescription       Expected Outcomes Short Term: Able to explain program exercise  prescription;Long Term: Able to explain home exercise prescription to exercise independently                Exercise Goals Re-Evaluation :  Exercise Goals Re-Evaluation     Row Name 06/14/23 1622 06/30/23 1550           Exercise Goal Re-Evaluation   Exercise Goals Review -- Increase Physical Activity;Increase Strength and Stamina;Understanding of Exercise Prescription      Comments Reviewed RPE and dyspnea scale, THR and program  prescription with pt today.  Pt voiced understanding and was given a copy of goals to take home. Milon is off to a good start in the program. He was able to attend his first 6 sessions during this review period. During those few sessions he was able to increase both the T5 nustep, and recumbent bike to level 2. He was also able to increase to level 3 on the T4 nustep. We will continue to monitor his progress in the program.      Expected Outcomes Short: Use RPE daily to regulate intensity. Long: Follow program prescription in THR. Short: Continue to follow exercise prescription. Long: Continue exercise to improve strength and stamina.               Discharge Exercise Prescription (Final Exercise Prescription Changes):  Exercise Prescription Changes - 06/30/23 1500       Response to Exercise   Blood Pressure (Admit) 94/54    Blood Pressure (Exercise) 138/70    Blood Pressure (Exit) 96/50    Heart Rate (Admit) 70 bpm    Heart Rate (Exercise) 98 bpm    Heart Rate (Exit) 84 bpm    Oxygen Saturation (Admit) 98 %    Oxygen Saturation (Exercise) 92 %    Oxygen Saturation (Exit) 97 %    Rating of Perceived Exertion (Exercise) 15    Perceived Dyspnea (Exercise) 3    Symptoms none    Comments first 2weeks of exercise    Duration Progress to 30 minutes of  aerobic without signs/symptoms of physical distress    Intensity THRR unchanged      Progression   Progression Continue to progress workloads to maintain intensity without signs/symptoms of physical distress.    Average METs 2.45      Resistance Training   Training Prescription Yes    Weight 4lb    Reps 10-15      Interval Training   Interval Training No      Oxygen   Oxygen Continuous    Liters 4      Recumbant Bike   Level 2    Watts 25    Minutes 15    METs 3.1      NuStep   Level 3    Minutes 15    METs 2.9      T5 Nustep   Level 2    Minutes 15    METs 1.8      Biostep-RELP   Level 2    Minutes 15    METs  3      Track   Laps 9    Minutes 15    METs 1.49      Oxygen   Maintain Oxygen Saturation 88% or higher             Nutrition:  Target Goals: Understanding of nutrition guidelines, daily intake of sodium 1500mg , cholesterol 200mg , calories 30% from fat and 7% or less from saturated fats, daily to have 5  or more servings of fruits and vegetables.  Education: All About Nutrition: -Group instruction provided by verbal, written material, interactive activities, discussions, models, and posters to present general guidelines for heart healthy nutrition including fat, fiber, MyPlate, the role of sodium in heart healthy nutrition, utilization of the nutrition label, and utilization of this knowledge for meal planning. Follow up email sent as well. Written material given at graduation. Flowsheet Row Pulmonary Rehab from 06/16/2023 in [facility] Cardiac and Pulmonary Rehab  Education need identified 06/02/23  Date 06/16/23  Educator JG  Instruction Review Code 1- Verbalizes Understanding       Biometrics:  Pre Biometrics - 06/02/23 1550       Pre Biometrics   Height 5\' 8"  (1.727 m)    Weight 158 lb 6.4 oz (71.8 kg)    Waist Circumference 34 inches    Hip Circumference 36.5 inches    Waist to Hip Ratio 0.93 %    BMI (Calculated) 24.09    Single Leg Stand 5.84 seconds              Nutrition Therapy Plan and Nutrition Goals:   Nutrition Assessments:  MEDIFICTS Score Key: >=70 Need to make dietary changes  40-70 Heart Healthy Diet <= 40 Therapeutic Level Cholesterol Diet  Flowsheet Row Pulmonary Rehab from 06/02/2023 in [name] Cardiac and Pulmonary Rehab  Picture Your Plate Total Score on Admission 59      Picture Your Plate Scores: <16 Unhealthy dietary pattern with much room for improvement. 41-50 Dietary pattern unlikely to meet recommendations for good health and room for improvement. 51-60 More healthful dietary pattern, with some room for improvement.  >60 Healthy  dietary pattern, although there may be some specific behaviors that could be improved.    Nutrition Goals Re-Evaluation:   Nutrition Goals Discharge (Final Nutrition Goals Re-Evaluation):   Psychosocial: Target Goals: Acknowledge presence or absence of significant depression and/or stress, maximize coping skills, provide positive support system. Participant is able to verbalize types and ability to use techniques and skills needed for reducing stress and depression.   Education: Stress, Anxiety, and Depression - Group verbal and visual presentation to define topics covered.  Reviews how body is impacted by stress, anxiety, and depression.  Also discusses healthy ways to reduce stress and to treat/manage anxiety and depression.  Written material given at graduation. Flowsheet Row Cardiac Rehab from 04/01/2016 in [facility] Cardiac and Pulmonary Rehab  Date 04/01/16  Educator [facility]  Instruction Review Code (retired) 2- meets goals/outcomes       Education: Sleep Hygiene -Provides group verbal and written instruction about how sleep can affect your health.  Define sleep hygiene, discuss sleep cycles and impact of sleep habits. Review good sleep hygiene tips.    Initial Review & Psychosocial Screening:  Initial Psych Review & Screening - 05/31/23 0942       Initial Review   Current issues with None Identified      Family Dynamics   Good Support System? Yes    Comments His wife and two children are a good support system for him. He has five grand kids. He is more angry at the fact that his disease does not let him do the things he wants to do.      Barriers   Psychosocial barriers to participate in program The patient should benefit from training in stress management and relaxation.;There are no identifiable barriers or psychosocial needs.      Screening Interventions   Interventions Encouraged to exercise;Provide feedback  about the scores to participant;To provide support and resources  with identified psychosocial needs    Expected Outcomes Short Term goal: Utilizing psychosocial counselor, staff and physician to assist with identification of specific Stressors or current issues interfering with healing process. Setting desired goal for each stressor or current issue identified.;Long Term Goal: Stressors or current issues are controlled or eliminated.;Short Term goal: Identification and review with participant of any Quality of Life or Depression concerns found by scoring the questionnaire.;Long Term goal: The participant improves quality of Life and PHQ9 Scores as seen by post scores and/or verbalization of changes             Quality of Life Scores:   Scores of 19 and below usually indicate a poorer quality of life in these areas.  A difference of  2-3 points is a clinically meaningful difference.  A difference of 2-3 points in the total score of the Quality of Life Index has been associated with significant improvement in overall quality of life, self-image, physical symptoms, and general health in studies assessing change in quality of life.  PHQ-9: Review Flowsheet  More data exists      06/02/2023 01/05/2022 12/12/2021 09/29/2021 03/25/2016  Depression screen PHQ 2/9  Decreased Interest 0 3 2 0 0  Down, Depressed, Hopeless 0 2 1 1  0  PHQ - 2 Score 0 5 3 1  0  Altered sleeping 0 1 3 3 1   Tired, decreased energy 0 3 1 2  0  Change in appetite 0 3 1 1  0  Feeling bad or failure about yourself  0 2 0 1 0  Trouble concentrating 0 1 0 0 0  Moving slowly or fidgety/restless 0 1 0 0 0  Suicidal thoughts 0 0 0 0 0  PHQ-9 Score 0 16 8 8 1   Difficult doing work/chores Not difficult at all Not difficult at all Somewhat difficult Somewhat difficult Not difficult at all   Interpretation of Total Score  Total Score Depression Severity:  1-4 = Minimal depression, 5-9 = Mild depression, 10-14 = Moderate depression, 15-19 = Moderately severe depression, 20-27 = Severe depression    Psychosocial Evaluation and Intervention:  Psychosocial Evaluation - 05/31/23 0944       Psychosocial Evaluation & Interventions   Interventions Encouraged to exercise with the program and follow exercise prescription;Relaxation education;Stress management education    Comments His wife and two children are a good support system for him. He has five grand kids. He is more angry at the fact that his disease does not let him do the things he wants to do.    Expected Outcomes Short: Attend [facility] stress management education to decrease stress. Long: Maintain exercise [facility] to keep stress at a minimum.    Continue Psychosocial Services  Follow up required by staff             Psychosocial Re-Evaluation:   Psychosocial Discharge (Final Psychosocial Re-Evaluation):   Vocational Rehabilitation: Provide vocational rehab assistance to qualifying candidates.   Vocational Rehab Evaluation & Intervention:   Education: Education Goals: Education classes will be provided on a variety of topics geared toward better understanding of heart health and risk factor modification. Participant will state understanding/return demonstration of topics presented as noted by education test scores.  Learning Barriers/Preferences:  Learning Barriers/Preferences - 05/31/23 0940       Learning Barriers/Preferences   Learning Barriers Sight    Learning Preferences None  General Cardiac Education Topics:  AED/CPR: - Group verbal and written instruction with the use of models to demonstrate the basic use of the AED with the basic ABC's of resuscitation.   Anatomy and Cardiac Procedures: - Group verbal and visual presentation and models provide information about basic cardiac anatomy and function. Reviews the testing methods done to diagnose heart disease and the outcomes of the test results. Describes the treatment choices: Medical Management, Angioplasty, or Coronary  Bypass Surgery for treating various heart conditions including Myocardial Infarction, Angina, Valve Disease, and Cardiac Arrhythmias.  Written material given at graduation. Flowsheet Row Cardiac Rehab from 04/01/2016 in [facility] Cardiac and Pulmonary Rehab  Date 02/24/16  Educator TS  Instruction Review Code (retired) 2- meets goals/outcomes       Medication Safety: - Group verbal and visual instruction to review commonly prescribed medications for heart and lung disease. Reviews the medication, class of the drug, and side effects. Includes the steps to properly store meds and maintain the prescription regimen.  Written material given at graduation. Flowsheet Row Cardiac Rehab from 04/01/2016 in [facility] Cardiac and Pulmonary Rehab  Date 03/11/16  Mario Sicard 2]  Educator TS  Instruction Review Code (retired) 2- meets goals/outcomes       Intimacy: - Group verbal instruction through game format to discuss how heart and lung disease can affect sexual intimacy. Written material given at graduation.. Flowsheet Row Cardiac Rehab from 04/01/2016 in [facility] Cardiac and Pulmonary Rehab  Instruction Review Code (retired) 2- meets goals/outcomes       Know Your Numbers and Heart Failure: - Group verbal and visual instruction to discuss disease risk factors for cardiac and pulmonary disease and treatment options.  Reviews associated critical values for Overweight/Obesity, Hypertension, Cholesterol, and Diabetes.  Discusses basics of heart failure: signs/symptoms and treatments.  Introduces Heart Failure Zone chart for action plan for heart failure.  Written material given at graduation. Flowsheet Row Pulmonary Rehab from 12/17/2021 in [facility] Cardiac and Pulmonary Rehab  Date 10/29/21  Educator SB  Instruction Review Code 1- Verbalizes Understanding       Infection Prevention: - Provides verbal and written material to individual with discussion of infection control including proper hand washing and proper  equipment cleaning during exercise session. Flowsheet Row Pulmonary Rehab from 06/16/2023 in [facility] Cardiac and Pulmonary Rehab  Date 06/02/23  Educator [facility]  Instruction Review Code 1- Verbalizes Understanding       Falls Prevention: - Provides verbal and written material to individual with discussion of falls prevention and safety. Flowsheet Row Pulmonary Rehab from 06/16/2023 in [facility] Cardiac and Pulmonary Rehab  Date 06/02/23  Educator [facility]  Instruction Review Code 1- Verbalizes Understanding       Other: -Provides group and verbal instruction on various topics (see comments)   Knowledge Questionnaire Score:  Knowledge Questionnaire Score - 06/02/23 1555       Knowledge Questionnaire Score   Pre Score 25             Core Components/Risk Factors/Patient Goals at Admission:  Personal Goals and Risk Factors at Admission - 05/31/23 0940       Core Components/Risk Factors/Patient Goals on Admission    Weight Management Yes;Weight Maintenance    Intervention Weight Management: Develop a combined nutrition and exercise program designed to reach desired caloric intake, while maintaining appropriate intake of nutrient and fiber, sodium and fats, and appropriate energy expenditure required for the weight goal.;Weight Management: Provide education and appropriate resources to help participant work on and  attain dietary goals.;Weight Management/Obesity: Establish reasonable short term and long term weight goals.    Expected Outcomes Short Term: Continue to assess and modify interventions until short term weight is achieved;Understanding recommendations for meals to include 15-35% energy as protein, 25-35% energy from fat, 35-60% energy from carbohydrates, less than 200mg  of dietary cholesterol, 20-35 gm of total fiber daily;Understanding of distribution of calorie intake throughout the day with the consumption of 4-5 meals/snacks;Long Term: Adherence to nutrition and physical  activity/exercise program aimed toward attainment of established weight goal;Weight Maintenance: Understanding of the daily nutrition guidelines, which includes 25-35% calories from fat, 7% or less cal from saturated fats, less than 200mg  cholesterol, less than 1.5gm of sodium, & 5 or more servings of fruits and vegetables daily    Improve shortness of breath with ADL's Yes    Intervention Provide education, individualized exercise plan and daily activity instruction to help decrease symptoms of SOB with activities of daily living.    Expected Outcomes Short Term: Improve cardiorespiratory fitness to achieve a reduction of symptoms when performing ADLs    Diabetes Yes    Intervention Provide education about signs/symptoms and action to take for hypo/hyperglycemia.;Provide education about proper nutrition, including hydration, and aerobic/resistive exercise prescription along with prescribed medications to achieve blood glucose in normal ranges: Fasting glucose 65-99 mg/dL    Expected Outcomes Short Term: Participant verbalizes understanding of the signs/symptoms and immediate care of hyper/hypoglycemia, proper foot care and importance of medication, aerobic/resistive exercise and nutrition plan for blood glucose control.;Long Term: Attainment of HbA1C < 7%.    Hypertension Yes    Intervention Provide education on lifestyle modifcations including regular physical activity/exercise, weight management, moderate sodium restriction and increased consumption of fresh fruit, vegetables, and low fat dairy, alcohol moderation, and smoking cessation.;Monitor prescription use compliance.    Expected Outcomes Short Term: Continued assessment and intervention until BP is < 140/70mm HG in hypertensive participants. < 130/20mm HG in hypertensive participants with diabetes, heart failure or chronic kidney disease.;Long Term: Maintenance of blood pressure at goal levels.    Lipids Yes    Intervention Provide education and  support for participant on nutrition & aerobic/resistive exercise along with prescribed medications to achieve LDL 70mg , HDL >40mg .    Expected Outcomes Short Term: Participant states understanding of desired cholesterol values and is compliant with medications prescribed. Participant is following exercise prescription and nutrition guidelines.;Long Term: Cholesterol controlled with medications as prescribed, with individualized exercise RX and with personalized nutrition plan. Value goals: LDL < 70mg , HDL > 40 mg.             Education:Diabetes - Individual verbal and written instruction to review signs/symptoms of diabetes, desired ranges of glucose level fasting, after meals and with exercise. Acknowledge that pre and post exercise glucose checks will be done for 3 sessions at entry of program. Flowsheet Row Pulmonary Rehab from 06/16/2023 in [facility] Cardiac and Pulmonary Rehab  Date 06/02/23  Educator [facility]  Instruction Review Code 1- Verbalizes Understanding       Core Components/Risk Factors/Patient Goals Review:    Core Components/Risk Factors/Patient Goals at Discharge (Final Review):    ITP Comments:  ITP Comments     Row Name 05/31/23 0937 06/02/23 1544 06/14/23 1622 06/30/23 0950     ITP Comments Virtual Visit completed. Patient informed on EP and RD appointment and 6 Minute walk test. Patient also informed of patient health questionnaires on My Chart. Patient Verbalizes understanding. Visit diagnosis can be found in [facility] media, patient  is [facility]. Completed and gym orientation. Initial ITP created and sent for review to Dr. Faud Aleskerov, Medical Director. First full day of exercise!  Patient was oriented to gym and equipment including functions, settings, policies, and procedures.  Patient's individual exercise prescription and treatment plan were reviewed.  All starting workloads were established based on the results of the 6 minute walk test done at initial orientation visit.   The plan for exercise progression was also introduced and progression will be customized based on patient's performance and goals. 30 Day review completed. Medical Director ITP review done, changes made as directed, and signed approval by Medical Director.    new to program             Comments: ***

## 2023-07-13 ENCOUNTER — Other Ambulatory Visit (HOSPITAL_COMMUNITY): Payer: Self-pay

## 2023-07-13 ENCOUNTER — Other Ambulatory Visit: Payer: Self-pay | Admitting: Internal Medicine

## 2023-07-13 MED ORDER — PREDNISONE 10 MG PO TABS
10.0000 mg | ORAL_TABLET | Freq: Every day | ORAL | 1 refills | Status: DC
  Filled 2023-07-13: qty 90, 90d supply, fill #0
  Filled 2023-10-08: qty 90, 90d supply, fill #1

## 2023-07-14 ENCOUNTER — Encounter: Admitting: *Deleted

## 2023-07-14 ENCOUNTER — Other Ambulatory Visit (HOSPITAL_COMMUNITY): Payer: Self-pay

## 2023-07-14 ENCOUNTER — Other Ambulatory Visit: Payer: Self-pay

## 2023-07-14 DIAGNOSIS — J849 Interstitial pulmonary disease, unspecified: Secondary | ICD-10-CM

## 2023-07-14 NOTE — Progress Notes (Signed)
 Daily Session Note  Patient Details  Name: Brian Casey MRN: [id number] Date of Birth: 01-Dec-1950 Referring Provider:   Flowsheet Row Pulmonary Rehab from 06/02/2023 in [facility] Cardiac and Pulmonary Rehab  Referring Provider Dr. Alphonso Aschoff       Encounter Date: 07/14/2023  Check In:  Session Check In - 07/14/23 1123       Check-In   Supervising physician immediately available to respond to emergencies See telemetry face sheet for immediately available ER MD    Location [facility]-Cardiac & Pulmonary Rehab    Staff Present Lyell Samuel, MS, Exercise Physiologist;Maxon Conetta BS, Exercise Physiologist;Thuan Tippett, RN, BSN, CCRP;Meredith Manson Seitz RN,BSN;[employer]    Virtual Visit No    Medication changes reported     No    Fall or balance concerns reported    No    Warm-up and Cool-down Performed on first and last piece of equipment    Resistance Training Performed Yes    VAD Patient? No    PAD/SET Patient? No      Pain Assessment   Currently in Pain? No/denies                Social History   Tobacco Use  Smoking Status Former   Current packs/day: 0.00   Average packs/day: 1 pack/day for 48.0 years (48.0 ttl pk-yrs)   Types: Cigarettes   Start date: 11/07/1967   Quit date: 11/07/2015   Years since quitting: 7.6  Smokeless Tobacco Never    Goals Met:  Proper associated with RPD/PD & O2 Sat Independence with exercise equipment Exercise tolerated well No report of concerns or symptoms today  Goals Unmet:  Not Applicable  Comments: Pt able to follow exercise prescription today without complaint.  Will continue to monitor for progression.    Dr. Firman Hughes is Medical Director for [facility] Cardiac Rehabilitation.  Dr. Fuad Aleskerov is Medical Director for [facility] Pulmonary Rehabilitation.

## 2023-07-16 ENCOUNTER — Encounter: Admitting: *Deleted

## 2023-07-16 DIAGNOSIS — J849 Interstitial pulmonary disease, unspecified: Secondary | ICD-10-CM | POA: Diagnosis not present

## 2023-07-16 NOTE — Progress Notes (Signed)
 Daily Session Note  Patient Details  Name: LUCIOUS ZOU MRN: [id number] Date of Birth: Aug 14, 1950 Referring Provider:   Flowsheet Row Pulmonary Rehab from 06/02/2023 in [facility] Cardiac and Pulmonary Rehab  Referring Provider Dr. Alphonso Aschoff       Encounter Date: 07/16/2023  Check In:  Session Check In - 07/16/23 1116       Check-In   Supervising physician immediately available to respond to emergencies See telemetry face sheet for immediately available ER MD    Location [facility]-Cardiac & Pulmonary Rehab    Staff Present Maxon Conetta BS, Exercise Physiologist;Noah Tickle, BS, Exercise Physiologist;Rejoice Heatwole, RN, BSN, CCRP;Joseph Hood RCP,RRT,BSRT    Virtual Visit No    Medication changes reported     No    Fall or balance concerns reported    No    Warm-up and Cool-down Performed on first and last piece of equipment    Resistance Training Performed Yes    VAD Patient? No    PAD/SET Patient? No      Pain Assessment   Currently in Pain? No/denies                Social History   Tobacco Use  Smoking Status Former   Current packs/day: 0.00   Average packs/day: 1 pack/day for 48.0 years (48.0 ttl pk-yrs)   Types: Cigarettes   Start date: 11/07/1967   Quit date: 11/07/2015   Years since quitting: 7.6  Smokeless Tobacco Never    Goals Met:  Proper associated with RPD/PD & O2 Sat Independence with exercise equipment Exercise tolerated well No report of concerns or symptoms today  Goals Unmet:  Not Applicable  Comments: Pt able to follow exercise prescription today without complaint.  Will continue to monitor for progression.    Dr. Firman Hughes is Medical Director for [facility] Cardiac Rehabilitation.  Dr. Fuad Aleskerov is Medical Director for [facility] Pulmonary Rehabilitation.

## 2023-07-19 ENCOUNTER — Encounter: Admitting: *Deleted

## 2023-07-19 DIAGNOSIS — J849 Interstitial pulmonary disease, unspecified: Secondary | ICD-10-CM | POA: Diagnosis not present

## 2023-07-19 NOTE — Progress Notes (Signed)
 Daily Session Note  Patient Details  Name: Brian Casey MRN: [id number] Date of Birth: 06/04/1950 Referring Provider:   Flowsheet Row Pulmonary Rehab from 06/02/2023 in [facility] Cardiac and Pulmonary Rehab  Referring Provider Dr. Alphonso Aschoff       Encounter Date: 07/19/2023  Check In:  Session Check In - 07/19/23 1130       Check-In   Supervising physician immediately available to respond to emergencies See telemetry face sheet for immediately available ER MD    Location [facility]-Cardiac & Pulmonary Rehab    Staff Present Maud Sorenson, RN, BSN, CCRP;Joseph Hood RCP,RRT,BSRT;Meredith Craven RN,BSN;Kelly Mayfield BS, ACSM CEP, Exercise Physiologist    Virtual Visit No    Medication changes reported     No    Fall or balance concerns reported    No    Warm-up and Cool-down Performed on first and last piece of equipment    Resistance Training Performed Yes    VAD Patient? No    PAD/SET Patient? No      Pain Assessment   Currently in Pain? No/denies                Social History   Tobacco Use  Smoking Status Former   Current packs/day: 0.00   Average packs/day: 1 pack/day for 48.0 years (48.0 ttl pk-yrs)   Types: Cigarettes   Start date: 11/07/1967   Quit date: 11/07/2015   Years since quitting: 7.7  Smokeless Tobacco Never    Goals Met:  Proper associated with RPD/PD & O2 Sat Independence with exercise equipment Exercise tolerated well Personal goals reviewed No report of concerns or symptoms today  Goals Unmet:  Not Applicable  Comments: Pt able to follow exercise prescription today without complaint.  Will continue to monitor for progression.   Reviewed home exercise with pt today from 11:15am to 11:25am.  Pt plans to walk, look into a gym membership, and do strength and stretching videos at home for exercise.  Reviewed THR, pulse, RPE, sign and symptoms, pulse oximetery and when to call 911 or MD.  Also discussed weather considerations and indoor options.  Pt  voiced understanding.     Dr. Firman Hughes is Medical Director for [facility] Cardiac Rehabilitation.  Dr. Fuad Aleskerov is Medical Director for [facility] Pulmonary Rehabilitation.

## 2023-07-21 ENCOUNTER — Encounter: Admitting: *Deleted

## 2023-07-21 DIAGNOSIS — J849 Interstitial pulmonary disease, unspecified: Secondary | ICD-10-CM

## 2023-07-21 NOTE — Progress Notes (Signed)
 Daily Session Note  Patient Details  Name: Brian Casey MRN: [id number] Date of Birth: Jul 28, 1950 Referring Provider:   Flowsheet Row Pulmonary Rehab from 06/02/2023 in [facility] Cardiac and Pulmonary Rehab  Referring Provider Dr. Alphonso Aschoff       Encounter Date: 07/21/2023  Check In:  Session Check In - 07/21/23 1432       Check-In   Supervising physician immediately available to respond to emergencies See telemetry face sheet for immediately available ER MD    Location [facility]-Cardiac & Pulmonary Rehab    Staff Present Maxon Conetta BS, Exercise Physiologist;Noah Tickle, BS, Exercise Physiologist;Annalise Mcdiarmid, RN, BSN, CCRP;Joseph Hood RCP,RRT,BSRT    Virtual Visit No    Medication changes reported     No    Fall or balance concerns reported    No    Warm-up and Cool-down Performed on first and last piece of equipment    Resistance Training Performed Yes    VAD Patient? No    PAD/SET Patient? No      Pain Assessment   Currently in Pain? No/denies                Social History   Tobacco Use  Smoking Status Former   Current packs/day: 0.00   Average packs/day: 1 pack/day for 48.0 years (48.0 ttl pk-yrs)   Types: Cigarettes   Start date: 11/07/1967   Quit date: 11/07/2015   Years since quitting: 7.7  Smokeless Tobacco Never    Goals Met:  Proper associated with RPD/PD & O2 Sat Independence with exercise equipment Exercise tolerated well No report of concerns or symptoms today  Goals Unmet:  Not Applicable  Comments: Pt able to follow exercise prescription today without complaint.  Will continue to monitor for progression.    Dr. Firman Hughes is Medical Director for [facility] Cardiac Rehabilitation.  Dr. Fuad Aleskerov is Medical Director for [facility] Pulmonary Rehabilitation.

## 2023-07-23 ENCOUNTER — Encounter: Attending: Family Medicine | Admitting: *Deleted

## 2023-07-23 DIAGNOSIS — J849 Interstitial pulmonary disease, unspecified: Secondary | ICD-10-CM | POA: Diagnosis present

## 2023-07-23 NOTE — Progress Notes (Signed)
 Daily Session Note  Patient Details  Name: Brian Casey MRN: [id number] Date of Birth: 1950/06/08 Referring Provider:   Flowsheet Row Pulmonary Rehab from 06/02/2023 in [facility] Cardiac and Pulmonary Rehab  Referring Provider Dr. Alphonso Aschoff       Encounter Date: 07/23/2023  Check In:  Session Check In - 07/23/23 1118       Check-In   Supervising physician immediately available to respond to emergencies See telemetry face sheet for immediately available ER MD    Location [facility]-Cardiac & Pulmonary Rehab    Staff Present Maud Sorenson, RN, BSN, CCRP;Joseph Hood RCP,RRT,BSRT;Maxon [place] BS, Exercise Physiologist;Noah Tickle, BS, Exercise Physiologist    Virtual Visit No    Medication changes reported     No    Fall or balance concerns reported    No    Warm-up and Cool-down Performed on first and last piece of equipment    Resistance Training Performed Yes    VAD Patient? No    PAD/SET Patient? No      Pain Assessment   Currently in Pain? No/denies                Social History   Tobacco Use  Smoking Status Former   Current packs/day: 0.00   Average packs/day: 1 pack/day for 48.0 years (48.0 ttl pk-yrs)   Types: Cigarettes   Start date: 11/07/1967   Quit date: 11/07/2015   Years since quitting: 7.7  Smokeless Tobacco Never    Goals Met:  Proper associated with RPD/PD & O2 Sat Independence with exercise equipment Exercise tolerated well No report of concerns or symptoms today  Goals Unmet:  Not Applicable  Comments: Pt able to follow exercise prescription today without complaint.  Will continue to monitor for progression.    Dr. Firman Hughes is Medical Director for [facility] Cardiac Rehabilitation.  Dr. Fuad Aleskerov is Medical Director for [facility] Pulmonary Rehabilitation.

## 2023-07-26 ENCOUNTER — Encounter

## 2023-07-27 ENCOUNTER — Other Ambulatory Visit (HOSPITAL_COMMUNITY): Payer: Self-pay

## 2023-07-28 ENCOUNTER — Other Ambulatory Visit: Payer: Self-pay

## 2023-07-28 ENCOUNTER — Other Ambulatory Visit (HOSPITAL_COMMUNITY): Payer: Self-pay

## 2023-07-28 ENCOUNTER — Encounter

## 2023-07-28 DIAGNOSIS — J849 Interstitial pulmonary disease, unspecified: Secondary | ICD-10-CM

## 2023-07-28 MED ORDER — LEVOTHYROXINE SODIUM 100 MCG PO TABS
ORAL_TABLET | ORAL | 3 refills | Status: DC
  Filled 2023-07-28: qty 90, 90d supply, fill #0

## 2023-07-28 NOTE — Progress Notes (Signed)
 Pulmonary Individual Treatment Plan  Patient Details  Name: ANASS CARNEGIE MRN: [id number] Date of Birth: May 23, 1950 Referring Provider:   Flowsheet Row Pulmonary Rehab from 06/02/2023 in [facility] Cardiac and Pulmonary Rehab  Referring Provider Dr. Alphonso Aschoff       Initial Encounter Date:  Flowsheet Row Pulmonary Rehab from 06/02/2023 in [facility] Cardiac and Pulmonary Rehab  Date 06/02/23       Visit Diagnosis: ILD (interstitial lung disease) (HCC)  Patient's Home Medications on Admission:  Current Outpatient Medications:    aspirin  EC 81 MG tablet, Take 81 mg by mouth daily. Swallow whole., Disp: , Rfl:    atorvastatin  (LIPITOR) 40 MG tablet, Take 1 tablet (40 mg total) by mouth daily., Disp: 90 tablet, Rfl: 3   carvedilol  (COREG ) 12.5 MG tablet, Take 1 tablet (12.5 mg total) by mouth 2 (two) times daily with a meal., Disp: 180 tablet, Rfl: 3   empagliflozin (JARDIANCE) 10 MG TABS tablet, Take 25 mg by mouth daily., Disp: , Rfl:    ezetimibe  (ZETIA ) 10 MG tablet, Take 1 tablet (10 mg total) by mouth daily., Disp: 90 tablet, Rfl: 3   furosemide  (LASIX ) 20 MG tablet, Take 1 tablet (20 mg total) by mouth daily., Disp: 90 tablet, Rfl: 3   glucose blood test strip, Use to test blood sugar twice a day, Disp: 200 each, Rfl: 3   levothyroxine  (SYNTHROID ) 100 MCG tablet, Take 1 tablet (100 mcg total) by mouth, on an empty stomach, daily with a glass of water at least 30-60 minutes before breakfast., Disp: 90 tablet, Rfl: 3   loperamide (IMODIUM) 2 MG capsule, Take 2 mg by mouth as needed for diarrhea or loose stools. (Patient not taking: Reported on 05/31/2023), Disp: , Rfl:    NEOMYCIN -POLYMYXIN-HYDROCORTISONE (CORTISPORIN) 1 % SOLN OTIC solution, Apply 1-2 drops to toe BID after soaking, Disp: 10 mL, Rfl: 1   omeprazole  (PRILOSEC) 20 MG capsule, Take 1 capsule (20 mg total) by mouth daily., Disp: 90 capsule, Rfl: 3   ondansetron  (ZOFRAN ) 4 MG tablet, Take 1 tablet (4 mg total) by mouth every 8  (eight) hours as needed for nausea and/or vomitting. (Patient not taking: Reported on 05/31/2023), Disp: 20 tablet, Rfl: 3   OXYGEN, Inhale 4 L into the lungs daily., Disp: , Rfl:    Pirfenidone  801 MG TABS, Take 1 tablet (801 mg total) by mouth in the morning and at bedtime. **low dose as maintenance**, Disp: 180 tablet, Rfl: 1   PRECISION QID TEST test strip, Use 2 (two) times daily, Disp: , Rfl:    predniSONE  (DELTASONE ) 10 MG tablet, Take 1 tablet (10 mg total) by mouth daily with breakfast., Disp: 90 tablet, Rfl: 1   tadalafil  (CIALIS ) 10 MG tablet, Take 1 tablet (10 mg total) by mouth daily as needed for erectile dysfunction., Disp: 90 tablet, Rfl: 3  Past Medical History: Past Medical History:  Diagnosis Date   Adenomatous colon polyp    Aortic root dilatation (HCC)    Noted on echo in August 2023.  Measured 39 mm.   Arthritis    "lower spine; fingers" (11/07/2015)   CAD (coronary artery disease)    a. 10/2015 NSTEMI/Cath: LM 40, LAD 70p, LCX 90ost, RI 90ost, RCA 60-70p, 90-25m/d, 40-50d; b. 10/2015 CABG x 4: LIMA->LAD, VG->Diag, VG->RI, VG->RPDA.   Chronic respiratory failure with hypoxia (HCC)    CKD (chronic kidney disease), stage III (HCC)    Diastolic dysfunction    a. 10/2021 Echo: EF 60-65%,   Diverticulosis  GERD (gastroesophageal reflux disease)    Grade I diastolic dysfunction    High cholesterol    History of bronchitis    Hypertension 10/17/2013   Hypothyroidism    ILD (interstitial lung disease) (HCC)    Mild pulmonary hypertension (HCC)    PAD (peripheral artery disease) (HCC)    s/p left carotid endarterectomy   Post-op atrial fibrillation (HCC) 11/06/2015   Shortness of breath    Type II diabetes mellitus (HCC)    type II   Wears glasses     Tobacco Use: Social History   Tobacco Use  Smoking Status Former   Current packs/day: 0.00   Average packs/day: 1 pack/day for 48.0 years (48.0 ttl pk-yrs)   Types: Cigarettes   Start date: 11/07/1967   Quit  date: 11/07/2015   Years since quitting: 7.7  Smokeless Tobacco Never    Labs: Review Flowsheet  More data exists      Latest Ref Rng & Units 11/14/2015 11/15/2015 01/15/2016 02/03/2022 05/25/2023  Labs for ITP Cardiac and Pulmonary Rehab  Cholestrol 100 - 199 mg/dL - - - - 161   LDL (calc) 0 - 99 mg/dL - - - - 88   HDL-C >09 mg/dL - - - - 59   Trlycerides 0 - 149 mg/dL - - - - 604   Hemoglobin A1c 4.8 - 5.6 % - - 5.5  - -  PH, Arterial 7.350 - 7.450 7.361  7.308  7.366  7.421  7.352  7.361  - - -  PCO2 arterial 35.0 - 45.0 mmHg 40.3  52.3  38.2  39.5  38.4  39.3  - - -  Bicarbonate 20.0 - 28.0 mmol/L 23.0  26.3  21.9  25.7  21.4  22.5  - 26.2  -  TCO2 22 - 32 mmol/L 24  23  28  23  22  24  24  27  26  26  25  28  25  22  23  24   - 27  -  Acid-base deficit 0.0 - 2.0 mmol/L 2.0  3.0  4.0  3.0  - - -  O2 Saturation % 97.0  100.0  99.0  100.0  61.7  95.0  98.0  - 72  -    Details       Multiple values from one day are sorted in reverse-chronological order          Pulmonary Assessment Scores:  Pulmonary Assessment Scores     Row Name 06/02/23 1554         mMRC Score   mMRC Score 1              UCSD: Self-administered rating of dyspnea associated with activities of daily living (ADLs) 6-point scale (0 = "not at all" to 5 = "maximal or unable to do because of breathlessness")  Scoring Scores range from 0 to 120.  Minimally important difference is 5 units  CAT: CAT can identify the health impairment of COPD patients and is better correlated with disease progression.  CAT has a scoring range of zero to 40. The CAT score is classified into four groups of low (less than 10), medium (10 - 20), high (21-30) and very high (31-40) based on the impact level of disease on health status. A CAT score over 10 suggests significant symptoms.  A worsening CAT score could be explained by an exacerbation, poor medication adherence, poor inhaler technique, or progression of COPD or comorbid  conditions.  CAT  MCID is 2 points  mMRC: mMRC (Modified Medical Research Council) Dyspnea Scale is used to assess the degree of baseline functional disability in patients of respiratory disease due to dyspnea. No minimal important difference is established. A decrease in score of 1 point or greater is considered a positive change.   Pulmonary Function Assessment:  Pulmonary Function Assessment - 05/31/23 0940       Breath   Shortness of Breath Yes;Fear of Shortness of Breath;Limiting activity             Exercise Target Goals: Exercise Program Goal: Individual exercise prescription set using results from initial 6 min walk test and THRR while considering  patient's activity barriers and safety.   Exercise Prescription Goal: Initial exercise prescription builds to 30-45 minutes a day of aerobic activity, 2-3 days per week.  Home exercise guidelines will be given to patient during program as part of exercise prescription that the participant will acknowledge.  Education: Aerobic Exercise: - Group verbal and visual presentation on the components of exercise prescription. Introduces F.I.T.T principle from ACSM for exercise prescriptions.  Reviews F.I.T.T. principles of aerobic exercise including progression. Written material given at graduation. Flowsheet Row Cardiac Rehab from 04/01/2016 in [facility] Cardiac and Pulmonary Rehab  Date 02/17/16  Educator [facility]  Instruction Review Code (retired) 2- meets goals/outcomes       Education: Resistance Exercise: - Group verbal and visual presentation on the components of exercise prescription. Introduces F.I.T.T principle from ACSM for exercise prescriptions  Reviews F.I.T.T. principles of resistance exercise including progression. Written material given at graduation. Flowsheet Row Pulmonary Rehab from 12/17/2021 in [facility] Cardiac and Pulmonary Rehab  Date 12/03/21  Educator NT  Instruction Review Code 1- [employer] Understanding         Education: Exercise & Equipment Safety: - Individual verbal instruction and demonstration of equipment use and safety with use of the equipment. Flowsheet Row Pulmonary Rehab from 06/16/2023 in [facility] Cardiac and Pulmonary Rehab  Date 06/02/23  Educator [facility]  Instruction Review Code 1- Verbalizes Understanding       Education: Exercise Physiology & General Exercise Guidelines: - Group verbal and written instruction with models to review the exercise physiology of the cardiovascular system and associated critical values. Provides general exercise guidelines with specific guidelines to those with heart or lung disease.    Education: Flexibility, Balance, Mind/Body Relaxation: - Group verbal and visual presentation with interactive activity on the components of exercise prescription. Introduces F.I.T.T principle from ACSM for exercise prescriptions. Reviews F.I.T.T. principles of flexibility and balance exercise training including progression. Also discusses the mind body connection.  Reviews various relaxation techniques to help reduce and manage stress (i.e. Deep breathing, progressive muscle relaxation, and visualization). Balance handout provided to take home. Written material given at graduation. Flowsheet Row Pulmonary Rehab from 12/17/2021 in [facility] Cardiac and Pulmonary Rehab  Date 12/03/21  Educator NT  Instruction Review Code 1- Verbalizes Understanding       Activity Barriers & Risk Stratification:  Activity Barriers & Cardiac Risk Stratification - 06/02/23 1544       Activity Barriers & Cardiac Risk Stratification   Activity Barriers Deconditioning;Shortness of Breath;Balance Concerns             6 Minute Walk:  6 Minute Walk     Row Name 06/02/23 1540         6 Minute Walk   Phase Initial     Distance 900 feet     Walk Time 6 minutes     #  of Rest Breaks 0     MPH 1.7     METS 2.26     RPE 13     Perceived Dyspnea  2     VO2 Peak 7.9     Symptoms No      Resting HR 73 bpm     Resting BP 104/62     Resting Oxygen Saturation  91 %     Exercise Oxygen Saturation  during 6 min walk 86 %     Max Ex. HR 106 bpm     Max Ex. BP 110/64     2 Minute Post BP 108/62       Interval HR   1 Minute HR 80     2 Minute HR 90     4 Minute HR 64     5 Minute HR 71     6 Minute HR 106     2 Minute Post HR 82     Interval Heart Rate? Yes       Interval Oxygen   Interval Oxygen? Yes     Baseline Oxygen Saturation % 91 %     1 Minute Oxygen Saturation % 93 %     1 Minute Liters of Oxygen 4 L     2 Minute Oxygen Saturation % 93 %     2 Minute Liters of Oxygen 4 L     3 Minute Oxygen Saturation % 91 %     3 Minute Liters of Oxygen 4 L     4 Minute Oxygen Saturation % 89 %     4 Minute Liters of Oxygen 4 L     5 Minute Oxygen Saturation % 87 %     5 Minute Liters of Oxygen 4 L     6 Minute Oxygen Saturation % 86 %     6 Minute Liters of Oxygen 4 L     2 Minute Post Oxygen Saturation % 97 %     2 Minute Post Liters of Oxygen 4 L             Oxygen Initial Assessment:  Oxygen Initial Assessment - 06/14/23 1623       Home Oxygen   Home Oxygen Device Home Concentrator;E-Tanks    Sleep Oxygen Prescription Continuous    Liters per minute 2    Home Exercise Oxygen Prescription Continuous    Liters per minute 4    Home Resting Oxygen Prescription None    Compliance with Home Oxygen Use Yes      Intervention   Short Term Goals --             Oxygen Re-Evaluation:  Oxygen Re-Evaluation     Row Name 06/14/23 1623 07/14/23 1107           Program Oxygen Prescription   Program Oxygen Prescription -- Continuous;E-Tanks      Liters per minute -- 4        Home Oxygen   Home Oxygen Device -- Home Concentrator;E-Tanks      Sleep Oxygen Prescription -- Continuous      Liters per minute -- 2      Liters per minute -- 4      Home Resting Oxygen Prescription -- None      Compliance with Home Oxygen Use -- Yes        Goals/Expected  Outcomes   Short Term Goals -- To learn and understand importance of maintaining oxygen saturations>88%;To learn and  understand importance of monitoring SPO2 with pulse oximeter and demonstrate accurate use of the pulse oximeter.      Long  Term Goals Exhibits proper breathing techniques, such as pursed lip breathing or other method taught during program session Verbalizes importance of monitoring SPO2 with pulse oximeter and return demonstration;Maintenance of O2 saturations>88%      Comments Reviewed PLB technique with pt.  Talked about how it works and it's importance in maintaining their exercise saturations. He has a pulse oximeter to check his oxygen saturation at home. Informed and explained why it is important to have one. Reviewed that oxygen saturations should be 88 percent and above. Patient verbalizes understanding.      Goals/Expected Outcomes Short: Become more profiecient at using PLB. Long: Become independent at using PLB. Short: monitor oxygen at home with exertion. Long: maintain oxygen saturations above 88 percent independently.               Oxygen Discharge (Final Oxygen Re-Evaluation):  Oxygen Re-Evaluation - 07/14/23 1107       Program Oxygen Prescription   Program Oxygen Prescription Continuous;E-Tanks    Liters per minute 4      Home Oxygen   Home Oxygen Device Home Concentrator;E-Tanks    Sleep Oxygen Prescription Continuous    Liters per minute 2    Liters per minute 4    Home Resting Oxygen Prescription None    Compliance with Home Oxygen Use Yes      Goals/Expected Outcomes   Short Term Goals To learn and understand importance of maintaining oxygen saturations>88%;To learn and understand importance of monitoring SPO2 with pulse oximeter and demonstrate accurate use of the pulse oximeter.    Long  Term Goals Verbalizes importance of monitoring SPO2 with pulse oximeter and return demonstration;Maintenance of O2 saturations>88%    Comments He has a pulse  oximeter to check his oxygen saturation at home. Informed and explained why it is important to have one. Reviewed that oxygen saturations should be 88 percent and above. Patient verbalizes understanding.    Goals/Expected Outcomes Short: monitor oxygen at home with exertion. Long: maintain oxygen saturations above 88 percent independently.             Initial Exercise Prescription:  Initial Exercise Prescription - 06/02/23 1500       Date of Initial Exercise RX and Referring Provider   Date 06/02/23    Referring Provider Dr. Alphonso Aschoff      Oxygen   Oxygen Continuous    Liters 4    Maintain Oxygen Saturation 88% or higher      Recumbant Bike   Level 1    RPM 50    Watts 25    Minutes 15    METs 2.26      NuStep   Level 1    SPM 80    Minutes 15    METs 2.26      Arm Ergometer   Level 1    RPM 30    Minutes 15    METs 2.26      T5 Nustep   Level 1    SPM 80    Minutes 15    METs 2.26      Track   Laps 23    Minutes 15    METs 2.25      Intensity   THRR 40-80% of Max Heartrate 103-133    Ratings of Perceived Exertion 11-13    Perceived Dyspnea 0-4  Resistance Training   Training Prescription Yes    Weight 4 lb    Reps 10-15             Perform Capillary Blood Glucose checks as needed.  Exercise Prescription Changes:   Exercise Prescription Changes     Row Name 06/02/23 1500 06/30/23 1500 07/15/23 1500 07/19/23 1100 07/27/23 1400     Response to Exercise   Blood Pressure (Admit) 104/62 94/54 104/62 -- 104/58   Blood Pressure (Exercise) 110/64 138/70 -- -- --   Blood Pressure (Exit) 108/62 96/50 128/66 -- 104/58   Heart Rate (Admit) 73 bpm 70 bpm 67 bpm -- 74 bpm   Heart Rate (Exercise) 106 bpm 98 bpm 98 bpm -- 89 bpm   Heart Rate (Exit) 82 bpm 84 bpm 82 bpm -- 82 bpm   Oxygen Saturation (Admit) 91 % 98 % 96 % -- 98 %   Oxygen Saturation (Exercise) 86 % 92 % 90 % -- 91 %   Oxygen Saturation (Exit) 97 % 97 % 96 % -- 97 %    Rating of Perceived Exertion (Exercise) 13 15 12  -- 12   Perceived Dyspnea (Exercise) 2 3 1  -- 1   Symptoms none none none -- none   Comments results first 2weeks of exercise -- -- --   Duration -- Progress to 30 minutes of  aerobic without signs/symptoms of physical distress Continue with 30 min of aerobic exercise without signs/symptoms of physical distress. Continue with 30 min of aerobic exercise without signs/symptoms of physical distress. Continue with 30 min of aerobic exercise without signs/symptoms of physical distress.   Intensity -- THRR unchanged THRR unchanged THRR unchanged THRR unchanged     Progression   Progression -- Continue to progress workloads to maintain intensity without signs/symptoms of physical distress. Continue to progress workloads to maintain intensity without signs/symptoms of physical distress. Continue to progress workloads to maintain intensity without signs/symptoms of physical distress. Continue to progress workloads to maintain intensity without signs/symptoms of physical distress.   Average METs 2.26 2.45 2.83 2.83 2.4     Resistance Training   Training Prescription -- Yes Yes Yes Yes   Weight -- 4lb 3 lb 3 lb 3 lb   Reps -- 10-15 10-15 10-15 10-15     Interval Training   Interval Training No No No No No     Oxygen   Oxygen -- Continuous Continuous Continuous Continuous   Liters -- 4 4 4 4      Recumbant Bike   Level -- 2 2 2 2    Watts -- 25 25 25 18    Minutes -- 15 10 10 15    METs -- 3.1 3.07 3.07 --     NuStep   Level -- 3 2 2 3    Minutes -- 15 15 15 15    METs -- 2.9 3 3  --     REL-XR   Level -- -- 2 2 4    Minutes -- -- 15 15 15    METs -- -- 3.7 3.7 2.8     T5 Nustep   Level -- 2 2 2 3    Minutes -- 15 15 15 15    METs -- 1.8 2 2  1.9     Biostep-RELP   Level -- 2 -- -- --   Minutes -- 15 -- -- --   METs -- 3 -- -- --     Track   Laps -- 9 -- -- --   Minutes -- 15 -- -- --  METs -- 1.49 -- -- --     Home Exercise Plan    Plans to continue exercise at -- -- -- Home (comment)  walk, look into gym membership, strength and stretching videos. Home (comment)  walk, look into gym membership, strength and stretching videos.   Frequency -- -- -- Add 2 additional days to program exercise sessions. Add 2 additional days to program exercise sessions.   Initial Home Exercises Provided -- -- -- 07/19/23 07/19/23     Oxygen   Maintain Oxygen Saturation -- 88% or higher 88% or higher 88% or higher 88% or higher            Exercise Comments:   Exercise Comments     Row Name 06/14/23 1622           Exercise Comments First full day of exercise!  Patient was oriented to gym and equipment including functions, settings, policies, and procedures.  Patient's individual exercise prescription and treatment plan were reviewed.  All starting workloads were established based on the results of the 6 minute walk test done at initial orientation visit.  The plan for exercise progression was also introduced and progression will be customized based on patient's performance and goals.                Exercise Goals and Review:   Exercise Goals     Row Name 06/02/23 1550             Exercise Goals   Increase Physical Activity Yes       Intervention Provide advice, education, support and counseling about physical activity/exercise needs.;Develop an individualized exercise prescription for aerobic and resistive training based on initial evaluation findings, risk stratification, comorbidities and participant's personal goals.       Expected Outcomes Short Term: Attend rehab on a regular basis to increase amount of physical activity.;Long Term: Add in home exercise to make exercise part of routine and to increase amount of physical activity.;Long Term: Exercising regularly at least 3-5 days a week.       Increase Strength and Stamina Yes       Intervention Provide advice, education, support and counseling about physical  activity/exercise needs.;Develop an individualized exercise prescription for aerobic and resistive training based on initial evaluation findings, risk stratification, comorbidities and participant's personal goals.       Expected Outcomes Short Term: Increase workloads from initial exercise prescription for resistance, speed, and METs.;Short Term: Perform resistance training exercises routinely during rehab and add in resistance training at home;Long Term: Improve cardiorespiratory fitness, muscular endurance and strength as measured by increased METs and functional capacity ( )       Able to understand and use rate of perceived exertion (RPE) scale Yes       Intervention Provide education and explanation on how to use RPE scale       Expected Outcomes Short Term: Able to use RPE daily in rehab to express subjective intensity level       Able to understand and use Dyspnea scale Yes       Intervention Provide education and explanation on how to use Dyspnea scale       Expected Outcomes Short Term: Able to use Dyspnea scale daily in rehab to express subjective sense of shortness of breath during exertion;Long Term: Able to use Dyspnea scale to guide intensity level when exercising independently       Knowledge and understanding of Target Heart Rate Range (THRR) Yes  Intervention Provide education and explanation of THRR including how the numbers were predicted and where they are located for reference       Expected Outcomes Short Term: Able to state/look up THRR;Short Term: Able to use daily as guideline for intensity in rehab;Long Term: Able to use THRR to govern intensity when exercising independently       Able to check pulse independently Yes       Intervention Provide education and demonstration on how to check pulse in carotid and radial arteries.;Review the importance of being able to check your own pulse for safety during independent exercise       Expected Outcomes Long Term: Able to  check pulse independently and accurately;Short Term: Able to explain why pulse checking is important during independent exercise       Understanding of Exercise Prescription Yes       Intervention Provide education, explanation, and written materials on patient's individual exercise prescription       Expected Outcomes Short Term: Able to explain program exercise prescription;Long Term: Able to explain home exercise prescription to exercise independently                Exercise Goals Re-Evaluation :  Exercise Goals Re-Evaluation     Row Name 06/14/23 1622 06/30/23 1550 07/15/23 1536 07/19/23 1135 07/27/23 1417     Exercise Goal Re-Evaluation   Exercise Goals Review -- Increase Physical Activity;Increase Strength and Stamina;Understanding of Exercise Prescription Increase Physical Activity;Increase Strength and Stamina;Understanding of Exercise Prescription Increase Physical Activity;Increase Strength and Stamina;Able to understand and use rate of perceived exertion (RPE) scale;Able to understand and use Dyspnea scale;Knowledge and understanding of Target Heart Rate Range (THRR);Able to check pulse independently;Understanding of Exercise Prescription Increase Physical Activity;Understanding of Exercise Prescription;Increase Strength and Stamina   Comments Reviewed RPE and dyspnea scale, THR and program prescription with pt today.  Pt voiced understanding and was given a copy of goals to take home. Welford is off to a good start in the program. He was able to attend his first 6 sessions during this review period. During those few sessions he was able to increase both the T5 nustep, and recumbent bike to level 2. He was also able to increase to level 3 on the T4 nustep. We will continue to monitor his progress in the program. Godwin is doing well in rehab. He began using the XR at level 2 and continued at level 2 on all other seated machines. He also continues to do well with keeping his O2 saturations  above 88% during exercise. We will continue to monitor his progress in the program. Reviewed home exercise with pt today from 11:15am to 11:25am.  Pt plans to walk, look into a gym membership, and do strength and stretching videos at home for exercise.  Reviewed THR, pulse, RPE, sign and symptoms, pulse oximetery and when to call 911 or MD.  Also discussed weather considerations and indoor options.  Pt voiced understanding. Fairley continues to do well in rehab. He increased to level 3 on the T4 and T5 nusteps. He also increased to level 4 on the XR. We will continue to monitor his progress in the program.   Expected Outcomes Short: Use RPE daily to regulate intensity. Long: Follow program prescription in THR. Short: Continue to follow exercise prescription. Long: Continue exercise to improve strength and stamina. Short: Continue to progressively increase workloads. Long: Continue exercise to improve strength and stamina. Short: add 1-2 days a week of exercise at  home on off days of pulmonary rehab. Long: maintain independent exercise routine upon graduation from pulmonary rehab. Short: Continue to progressively increase nustep and XR workloads. Long: Continue exercise to improve strength and stamina.            Discharge Exercise Prescription (Final Exercise Prescription Changes):  Exercise Prescription Changes - 07/27/23 1400       Response to Exercise   Blood Pressure (Admit) 104/58    Blood Pressure (Exit) 104/58    Heart Rate (Admit) 74 bpm    Heart Rate (Exercise) 89 bpm    Heart Rate (Exit) 82 bpm    Oxygen Saturation (Admit) 98 %    Oxygen Saturation (Exercise) 91 %    Oxygen Saturation (Exit) 97 %    Rating of Perceived Exertion (Exercise) 12    Perceived Dyspnea (Exercise) 1    Symptoms none    Duration Continue with 30 min of aerobic exercise without signs/symptoms of physical distress.    Intensity THRR unchanged      Progression   Progression Continue to progress workloads to  maintain intensity without signs/symptoms of physical distress.    Average METs 2.4      Resistance Training   Training Prescription Yes    Weight 3 lb    Reps 10-15      Interval Training   Interval Training No      Oxygen   Oxygen Continuous    Liters 4      Recumbant Bike   Level 2    Watts 18    Minutes 15      NuStep   Level 3    Minutes 15      REL-XR   Level 4    Minutes 15    METs 2.8      T5 Nustep   Level 3    Minutes 15    METs 1.9      Home Exercise Plan   Plans to continue exercise at Home (comment)   walk, look into gym membership, strength and stretching videos.   Frequency Add 2 additional days to program exercise sessions.    Initial Home Exercises Provided 07/19/23      Oxygen   Maintain Oxygen Saturation 88% or higher             Nutrition:  Target Goals: Understanding of nutrition guidelines, daily intake of sodium 1500mg , cholesterol 200mg , calories 30% from fat and 7% or less from saturated fats, daily to have 5 or more servings of fruits and vegetables.  Education: All About Nutrition: -Group instruction provided by verbal, written material, interactive activities, discussions, models, and posters to present general guidelines for heart healthy nutrition including fat, fiber, MyPlate, the role of sodium in heart healthy nutrition, utilization of the nutrition label, and utilization of this knowledge for meal planning. Follow up email sent as well. Written material given at graduation. Flowsheet Row Pulmonary Rehab from 06/16/2023 in [facility] Cardiac and Pulmonary Rehab  Education need identified 06/02/23  Date 06/16/23  Educator JG  Instruction Review Code 1- Verbalizes Understanding       Biometrics:  Pre Biometrics - 06/02/23 1550       Pre Biometrics   Height 5\' 8"  (1.727 m)    Weight 158 lb 6.4 oz (71.8 kg)    Waist Circumference 34 inches    Hip Circumference 36.5 inches    Waist to Hip Ratio 0.93 %    BMI (Calculated)  24.09  Single Leg Stand 5.84 seconds              Nutrition Therapy Plan and Nutrition Goals:   Nutrition Assessments:  MEDIFICTS Score Key: >=70 Need to make dietary changes  40-70 Heart Healthy Diet <= 40 Therapeutic Level Cholesterol Diet  Flowsheet Row Pulmonary Rehab from 06/02/2023 in [facility] Cardiac and Pulmonary Rehab  Picture Your Plate Total Score on Admission 59      Picture Your Plate Scores: <65 Unhealthy dietary pattern with much room for improvement. 41-50 Dietary pattern unlikely to meet recommendations for good health and room for improvement. 51-60 More healthful dietary pattern, with some room for improvement.  >60 Healthy dietary pattern, although there may be some specific behaviors that could be improved.   Nutrition Goals Re-Evaluation:  Nutrition Goals Re-Evaluation     Row Name 07/14/23 1111             Goals   Current Weight 157 lb (71.2 kg)       Comment Patient was informed on why it is important to maintain a balanced diet when dealing with Respiratory issues. Explained that it takes a lot of energy to breath and when they are short of breath often they will need to have a good diet to help keep up with the calories they are expending for breathing.       Expected Outcome Short: Choose and plan snacks accordingly to patients caloric intake to improve breathing. Long: Maintain a diet independently that meets their caloric intake to aid in daily shortness of breath.                Nutrition Goals Discharge (Final Nutrition Goals Re-Evaluation):  Nutrition Goals Re-Evaluation - 07/14/23 1111       Goals   Current Weight 157 lb (71.2 kg)    Comment Patient was informed on why it is important to maintain a balanced diet when dealing with Respiratory issues. Explained that it takes a lot of energy to breath and when they are short of breath often they will need to have a good diet to help keep up with the calories they are expending for  breathing.    Expected Outcome Short: Choose and plan snacks accordingly to patients caloric intake to improve breathing. Long: Maintain a diet independently that meets their caloric intake to aid in daily shortness of breath.             Psychosocial: Target Goals: Acknowledge presence or absence of significant depression and/or stress, maximize coping skills, provide positive support system. Participant is able to verbalize types and ability to use techniques and skills needed for reducing stress and depression.   Education: Stress, Anxiety, and Depression - Group verbal and visual presentation to define topics covered.  Reviews how body is impacted by stress, anxiety, and depression.  Also discusses healthy ways to reduce stress and to treat/manage anxiety and depression.  Written material given at graduation. Flowsheet Row Cardiac Rehab from 04/01/2016 in [facility] Cardiac and Pulmonary Rehab  Date 04/01/16  Educator [facility]  Instruction Review Code (retired) 2- meets goals/outcomes       Education: Sleep Hygiene -Provides group verbal and written instruction about how sleep can affect your health.  Define sleep hygiene, discuss sleep cycles and impact of sleep habits. Review good sleep hygiene tips.    Initial Review & Psychosocial Screening:  Initial Psych Review & Screening - 05/31/23 0942       Initial Review   Current issues  with None Identified      Family Dynamics   Good Support System? Yes    Comments His wife and two children are a good support system for him. He has five grand kids. He is more angry at the fact that his disease does not let him do the things he wants to do.      Barriers   Psychosocial barriers to participate in program The patient should benefit from training in stress management and relaxation.;There are no identifiable barriers or psychosocial needs.      Screening Interventions   Interventions Encouraged to exercise;Provide feedback about the scores to  participant;To provide support and resources with identified psychosocial needs    Expected Outcomes Short Term goal: Utilizing psychosocial counselor, staff and physician to assist with identification of specific Stressors or current issues interfering with healing process. Setting desired goal for each stressor or current issue identified.;Long Term Goal: Stressors or current issues are controlled or eliminated.;Short Term goal: Identification and review with participant of any Quality of Life or Depression concerns found by scoring the questionnaire.;Long Term goal: The participant improves quality of Life and PHQ9 Scores as seen by post scores and/or verbalization of changes             Quality of Life Scores:  Scores of 19 and below usually indicate a poorer quality of life in these areas.  A difference of  2-3 points is a clinically meaningful difference.  A difference of 2-3 points in the total score of the Quality of Life Index has been associated with significant improvement in overall quality of life, self-image, physical symptoms, and general health in studies assessing change in quality of life.  PHQ-9: Review Flowsheet  More data exists      06/02/2023 01/05/2022 12/12/2021 09/29/2021 03/25/2016  Depression screen PHQ 2/9  Decreased Interest 0 3 2 0 0  Down, Depressed, Hopeless 0 2 1 1  0  PHQ - 2 Score 0 5 3 1  0  Altered sleeping 0 1 3 3 1   Tired, decreased energy 0 3 1 2  0  Change in appetite 0 3 1 1  0  Feeling bad or failure about yourself  0 2 0 1 0  Trouble concentrating 0 1 0 0 0  Moving slowly or fidgety/restless 0 1 0 0 0  Suicidal thoughts 0 0 0 0 0  PHQ-9 Score 0 16 8 8 1   Difficult doing work/chores Not difficult at all Not difficult at all Somewhat difficult Somewhat difficult Not difficult at all   Interpretation of Total Score  Total Score Depression Severity:  1-4 = Minimal depression, 5-9 = Mild depression, 10-14 = Moderate depression, 15-19 = Moderately  severe depression, 20-27 = Severe depression   Psychosocial Evaluation and Intervention:  Psychosocial Evaluation - 05/31/23 0944       Psychosocial Evaluation & Interventions   Interventions Encouraged to exercise with the program and follow exercise prescription;Relaxation education;Stress management education    Comments His wife and two children are a good support system for him. He has five grand kids. He is more angry at the fact that his disease does not let him do the things he wants to do.    Expected Outcomes Short: Attend [facility] stress management education to decrease stress. Long: Maintain exercise Post [facility] to keep stress at a minimum.    Continue Psychosocial Services  Follow up required by staff             Psychosocial Re-Evaluation:  Psychosocial Re-Evaluation     Row Name 07/14/23 1112             Psychosocial Re-Evaluation   Current issues with Current Depression       Comments Damere is more depressed than he was becasue he sees that he is not improving with his breathing. There is things that he is realizing that he cant do anymore and is trying to make the best of it.       Expected Outcomes Short: Attend [facility] stress management education to decrease stress. Long: Maintain exercise Post [facility] to keep stress at a minimum.       Interventions Encouraged to attend Pulmonary Rehabilitation for the exercise       Continue Psychosocial Services  Follow up required by staff                Psychosocial Discharge (Final Psychosocial Re-Evaluation):  Psychosocial Re-Evaluation - 07/14/23 1112       Psychosocial Re-Evaluation   Current issues with Current Depression    Comments Smayan is more depressed than he was becasue he sees that he is not improving with his breathing. There is things that he is realizing that he cant do anymore and is trying to make the best of it.    Expected Outcomes Short: Attend [facility] stress management education to  decrease stress. Long: Maintain exercise Post [facility] to keep stress at a minimum.    Interventions Encouraged to attend Pulmonary Rehabilitation for the exercise    Continue Psychosocial Services  Follow up required by staff             Education: Education Goals: Education classes will be provided on a weekly basis, covering required topics. Participant will state understanding/return demonstration of topics presented.  Learning Barriers/Preferences:  Learning Barriers/Preferences - 05/31/23 0940       Learning Barriers/Preferences   Learning Barriers Sight    Learning Preferences None             General Pulmonary Education Topics:  Infection Prevention: - Provides verbal and written material to individual with discussion of infection control including proper hand washing and proper equipment cleaning during exercise session. Flowsheet Row Pulmonary Rehab from 06/16/2023 in [facility] Cardiac and Pulmonary Rehab  Date 06/02/23  Educator [facility]  Instruction Review Code 1- Verbalizes Understanding       Falls Prevention: - Provides verbal and written material to individual with discussion of falls prevention and safety. Flowsheet Row Pulmonary Rehab from 06/16/2023 in [facility] Cardiac and Pulmonary Rehab  Date 06/02/23  Educator [facility]  Instruction Review Code 1- Verbalizes Understanding       Chronic Lung Disease Review: - Group verbal instruction with posters, models, PowerPoint presentations and videos,  to review new updates, new respiratory medications, new advancements in procedures and treatments. Providing information on websites and "800" numbers for continued self-education. Includes information about supplement oxygen, available portable oxygen systems, continuous and intermittent flow rates, oxygen safety, concentrators, and Medicare reimbursement for oxygen. Explanation of Pulmonary Drugs, including class, frequency, complications, importance of spacers, rinsing mouth  after steroid MDI's, and proper cleaning methods for nebulizers. Review of basic lung anatomy and physiology related to function, structure, and complications of lung disease. Review of risk factors. Discussion about methods for diagnosing sleep apnea and types of masks and machines for OSA. Includes a review of the use of types of environmental controls: home humidity, furnaces, filters, dust mite/pet prevention, HEPA vacuums. Discussion about weather changes, air quality and  the benefits of nasal washing. Instruction on Warning signs, infection symptoms, calling MD promptly, preventive modes, and value of vaccinations. Review of effective airway clearance, coughing and/or vibration techniques. Emphasizing that all should Create an Action Plan. Written material given at graduation. Flowsheet Row Pulmonary Rehab from 12/17/2021 in [facility] Cardiac and Pulmonary Rehab  Education need identified 09/29/21  Date 11/06/21  Educator [facility]  Instruction Review Code 1- Verbalizes Understanding       AED/CPR: - Group verbal and written instruction with the use of models to demonstrate the basic use of the AED with the basic ABC's of resuscitation.    Anatomy and Cardiac Procedures: - Group verbal and visual presentation and models provide information about basic cardiac anatomy and function. Reviews the testing methods done to diagnose heart disease and the outcomes of the test results. Describes the treatment choices: Medical Management, Angioplasty, or Coronary Bypass Surgery for treating various heart conditions including Myocardial Infarction, Angina, Valve Disease, and Cardiac Arrhythmias.  Written material given at graduation. Flowsheet Row Cardiac Rehab from 04/01/2016 in [facility] Cardiac and Pulmonary Rehab  Date 02/24/16  Educator TS  Instruction Review Code (retired) 2- meets goals/outcomes       Medication Safety: - Group verbal and visual instruction to review commonly prescribed medications for heart  and lung disease. Reviews the medication, class of the drug, and side effects. Includes the steps to properly store meds and maintain the prescription regimen.  Written material given at graduation. Flowsheet Row Cardiac Rehab from 04/01/2016 in [facility] Cardiac and Pulmonary Rehab  Date 03/11/16  Mario Sicard 2]  Educator TS  Instruction Review Code (retired) 2- meets goals/outcomes       Other: -Provides group and verbal instruction on various topics (see comments)   Knowledge Questionnaire Score:  Knowledge Questionnaire Score - 06/02/23 1555       Knowledge Questionnaire Score   Pre Score 25              Core Components/Risk Factors/Patient Goals at Admission:  Personal Goals and Risk Factors at Admission - 05/31/23 0940       Core Components/Risk Factors/Patient Goals on Admission    Weight Management Yes;Weight Maintenance    Intervention Weight Management: Develop a combined nutrition and exercise program designed to reach desired caloric intake, while maintaining appropriate intake of nutrient and fiber, sodium and fats, and appropriate energy expenditure required for the weight goal.;Weight Management: Provide education and appropriate resources to help participant work on and attain dietary goals.;Weight Management/Obesity: Establish reasonable short term and long term weight goals.    Expected Outcomes Short Term: Continue to assess and modify interventions until short term weight is achieved;Understanding recommendations for meals to include 15-35% energy as protein, 25-35% energy from fat, 35-60% energy from carbohydrates, less than 200mg  of dietary cholesterol, 20-35 gm of total fiber daily;Understanding of distribution of calorie intake throughout the day with the consumption of 4-5 meals/snacks;Long Term: Adherence to nutrition and physical activity/exercise program aimed toward attainment of established weight goal;Weight Maintenance: Understanding of the daily nutrition  guidelines, which includes 25-35% calories from fat, 7% or less cal from saturated fats, less than 200mg  cholesterol, less than 1.5gm of sodium, & 5 or more servings of fruits and vegetables daily    Improve shortness of breath with ADL's Yes    Intervention Provide education, individualized exercise plan and daily activity instruction to help decrease symptoms of SOB with activities of daily living.    Expected Outcomes Short Term: Improve cardiorespiratory fitness to  achieve a reduction of symptoms when performing ADLs    Diabetes Yes    Intervention Provide education about signs/symptoms and action to take for hypo/hyperglycemia.;Provide education about proper nutrition, including hydration, and aerobic/resistive exercise prescription along with prescribed medications to achieve blood glucose in normal ranges: Fasting glucose 65-99 mg/dL    Expected Outcomes Short Term: Participant verbalizes understanding of the signs/symptoms and immediate care of hyper/hypoglycemia, proper foot care and importance of medication, aerobic/resistive exercise and nutrition plan for blood glucose control.;Long Term: Attainment of HbA1C < 7%.    Hypertension Yes    Intervention Provide education on lifestyle modifcations including regular physical activity/exercise, weight management, moderate sodium restriction and increased consumption of fresh fruit, vegetables, and low fat dairy, alcohol moderation, and smoking cessation.;Monitor prescription use compliance.    Expected Outcomes Short Term: Continued assessment and intervention until BP is < 140/85mm HG in hypertensive participants. < 130/33mm HG in hypertensive participants with diabetes, heart failure or chronic kidney disease.;Long Term: Maintenance of blood pressure at goal levels.    Lipids Yes    Intervention Provide education and support for participant on nutrition & aerobic/resistive exercise along with prescribed medications to achieve LDL 70mg , HDL >40mg .     Expected Outcomes Short Term: Participant states understanding of desired cholesterol values and is compliant with medications prescribed. Participant is following exercise prescription and nutrition guidelines.;Long Term: Cholesterol controlled with medications as prescribed, with individualized exercise RX and with personalized nutrition plan. Value goals: LDL < 70mg , HDL > 40 mg.             Education:Diabetes - Individual verbal and written instruction to review signs/symptoms of diabetes, desired ranges of glucose level fasting, after meals and with exercise. Acknowledge that pre and post exercise glucose checks will be done for 3 sessions at entry of program. Flowsheet Row Pulmonary Rehab from 06/16/2023 in [facility] Cardiac and Pulmonary Rehab  Date 06/02/23  Educator [facility]  Instruction Review Code 1- Verbalizes Understanding       Know Your Numbers and Heart Failure: - Group verbal and visual instruction to discuss disease risk factors for cardiac and pulmonary disease and treatment options.  Reviews associated critical values for Overweight/Obesity, Hypertension, Cholesterol, and Diabetes.  Discusses basics of heart failure: signs/symptoms and treatments.  Introduces Heart Failure Zone chart for action plan for heart failure.  Written material given at graduation. Flowsheet Row Pulmonary Rehab from 12/17/2021 in [facility] Cardiac and Pulmonary Rehab  Date 10/29/21  Educator SB  Instruction Review Code 1- Verbalizes Understanding       Core Components/Risk Factors/Patient Goals Review:   Goals and Risk Factor Review     Row Name 07/14/23 1110             Core Components/Risk Factors/Patient Goals Review   Personal Goals Review Improve shortness of breath with ADL's       Review Spoke to patient about their shortness of breath and what they can do to improve. Patient has been informed of breathing techniques when starting the program. Patient is informed to tell staff if they have  had any med changes and that certain meds they are taking or not taking can be causing shortness of breath.       Expected Outcomes Short: Attend [facility] regularly to improve shortness of breath with ADL's. Long: maintain independence with ADL's                Core Components/Risk Factors/Patient Goals at Discharge (Final Review):   Goals and  Risk Factor Review - 07/14/23 1110       Core Components/Risk Factors/Patient Goals Review   Personal Goals Review Improve shortness of breath with ADL's    Review Spoke to patient about their shortness of breath and what they can do to improve. Patient has been informed of breathing techniques when starting the program. Patient is informed to tell staff if they have had any med changes and that certain meds they are taking or not taking can be causing shortness of breath.    Expected Outcomes Short: Attend [facility] regularly to improve shortness of breath with ADL's. Long: maintain independence with ADL's             ITP Comments:  ITP Comments     Row Name 05/31/23 2440 06/02/23 1544 06/14/23 1622 06/30/23 0950 07/28/23 1046   ITP Comments Virtual Visit completed. Patient informed on EP and RD appointment and 6 Minute walk test. Patient also informed of patient health questionnaires on My Chart. Patient Verbalizes understanding. Visit diagnosis can be found in [facility] media, patient is [place]. Completed and gym orientation. Initial ITP created and sent for review to Dr. Faud Aleskerov, Medical Director. First full day of exercise!  Patient was oriented to gym and equipment including functions, settings, policies, and procedures.  Patient's individual exercise prescription and treatment plan were reviewed.  All starting workloads were established based on the results of the 6 minute walk test done at initial orientation visit.  The plan for exercise progression was also introduced and progression will be customized based on patient's performance and  goals. 30 Day review completed. Medical Director ITP review done, changes made as directed, and signed approval by Medical Director.    new to program 30 Day review completed. Medical Director ITP review done, changes made as directed, and signed approval by Medical Director.            Comments: 30 day review

## 2023-07-28 NOTE — Progress Notes (Signed)
 30 Day review completed. Medical Director ITP review done, changes made as directed, and signed approval by Medical Director. ? ?

## 2023-07-29 ENCOUNTER — Other Ambulatory Visit (HOSPITAL_COMMUNITY): Payer: Self-pay

## 2023-07-30 ENCOUNTER — Encounter

## 2023-08-02 ENCOUNTER — Encounter: Admitting: *Deleted

## 2023-08-02 DIAGNOSIS — J849 Interstitial pulmonary disease, unspecified: Secondary | ICD-10-CM

## 2023-08-02 NOTE — Progress Notes (Signed)
 Daily Session Note  Patient Details  Name: Brian Casey MRN: [id number] Date of Birth: [birth year] Referring Provider:   Flowsheet Row Pulmonary Rehab from 06/02/2023 in [facility] Cardiac and Pulmonary Rehab  Referring Provider Dr. Alphonso Aschoff       Encounter Date: 08/02/2023  Check In:  Session Check In - 08/02/23 1130       Check-In   Supervising physician immediately available to respond to emergencies See telemetry face sheet for immediately available ER MD    Location [facility]-Cardiac & Pulmonary Rehab    Staff Present Maud Sorenson, RN, BSN, CCRP;Joseph Hood RCP,RRT,BSRT;Kelly Keener BS, ACSM CEP, Exercise Physiologist;Maxon [employer], Exercise Physiologist;Margaret Best, MS, Exercise Physiologist    Virtual Visit No    Medication changes reported     No    Fall or balance concerns reported    No    Warm-up and Cool-down Performed on first and last piece of equipment    Resistance Training Performed Yes    VAD Patient? No    PAD/SET Patient? No      Pain Assessment   Currently in Pain? No/denies                Social History   Tobacco Use  Smoking Status Former   Current packs/day: 0.00   Average packs/day: 1 pack/day for 48.0 years (48.0 ttl pk-yrs)   Types: Cigarettes   Start date: 11/07/1967   Quit date: 11/07/2015   Years since quitting: 7.7  Smokeless Tobacco Never    Goals Met:  Proper associated with RPD/PD & O2 Sat Independence with exercise equipment Exercise tolerated well No report of concerns or symptoms today  Goals Unmet:  Not Applicable  Comments: Pt able to follow exercise prescription today without complaint.  Will continue to monitor for progression.    Dr. Firman Hughes is Medical Director for [facility] Cardiac Rehabilitation.  Dr. Fuad Aleskerov is Medical Director for [facility] Pulmonary Rehabilitation.

## 2023-08-04 ENCOUNTER — Encounter: Admitting: *Deleted

## 2023-08-04 DIAGNOSIS — J849 Interstitial pulmonary disease, unspecified: Secondary | ICD-10-CM | POA: Diagnosis not present

## 2023-08-04 NOTE — Progress Notes (Signed)
 Daily Session Note  Patient Details  Name: Brian Casey MRN: [id number] Date of Birth: 1950-06-03 Referring Provider:   Flowsheet Row Pulmonary Rehab from 06/02/2023 in [facility] Cardiac and Pulmonary Rehab  Referring Provider Dr. Alphonso Aschoff       Encounter Date: 08/04/2023  Check In:  Session Check In - 08/04/23 1152       Check-In   Supervising physician immediately available to respond to emergencies See telemetry face sheet for immediately available ER MD    Location [facility]-Cardiac & Pulmonary Rehab    Staff Present Maud Sorenson, RN, BSN, CCRP;Joseph Hood RCP,RRT,BSRT;Maxon [place] BS, Exercise Physiologist;Noah Tickle, BS, Exercise Physiologist    Virtual Visit No    Medication changes reported     No    Fall or balance concerns reported    No    Warm-up and Cool-down Performed on first and last piece of equipment    Resistance Training Performed Yes    VAD Patient? No    PAD/SET Patient? No      Pain Assessment   Currently in Pain? No/denies                Social History   Tobacco Use  Smoking Status Former   Current packs/day: 0.00   Average packs/day: 1 pack/day for 48.0 years (48.0 ttl pk-yrs)   Types: Cigarettes   Start date: 11/07/1967   Quit date: 11/07/2015   Years since quitting: 7.7  Smokeless Tobacco Never    Goals Met:  Proper associated with RPD/PD & O2 Sat Independence with exercise equipment Exercise tolerated well No report of concerns or symptoms today  Goals Unmet:  Not Applicable  Comments: Pt able to follow exercise prescription today without complaint.  Will continue to monitor for progression.    Dr. Firman Hughes is Medical Director for [facility] Cardiac Rehabilitation.  Dr. Fuad Aleskerov is Medical Director for [facility] Pulmonary Rehabilitation.

## 2023-08-06 ENCOUNTER — Encounter: Admitting: *Deleted

## 2023-08-06 DIAGNOSIS — J849 Interstitial pulmonary disease, unspecified: Secondary | ICD-10-CM

## 2023-08-06 NOTE — Progress Notes (Signed)
 Daily Session Note  Patient Details  Name: Brian Casey MRN: [id number] Date of Birth: 26-Jun-1950 Referring Provider:   Flowsheet Row Pulmonary Rehab from 06/02/2023 in [facility] Cardiac and Pulmonary Rehab  Referring Provider Dr. Alphonso Aschoff       Encounter Date: 08/06/2023  Check In:  Session Check In - 08/06/23 1111       Check-In   Supervising physician immediately available to respond to emergencies See telemetry face sheet for immediately available ER MD    Location [facility]-Cardiac & Pulmonary Rehab    Staff Present Maud Sorenson, RN, BSN, CCRP;Joseph Hood RCP,RRT,BSRT;Maxon [place] BS, Exercise Physiologist;Noah Tickle, BS, Exercise Physiologist    Virtual Visit No    Medication changes reported     No    Fall or balance concerns reported    No    Warm-up and Cool-down Performed on first and last piece of equipment    Resistance Training Performed Yes    VAD Patient? No    PAD/SET Patient? No      Pain Assessment   Currently in Pain? No/denies                Social History   Tobacco Use  Smoking Status Former   Current packs/day: 0.00   Average packs/day: 1 pack/day for 48.0 years (48.0 ttl pk-yrs)   Types: Cigarettes   Start date: 11/07/1967   Quit date: 11/07/2015   Years since quitting: 7.7  Smokeless Tobacco Never    Goals Met:  Proper associated with RPD/PD & O2 Sat Independence with exercise equipment Exercise tolerated well No report of concerns or symptoms today  Goals Unmet:  Not Applicable  Comments: Pt able to follow exercise prescription today without complaint.  Will continue to monitor for progression.    Dr. Firman Hughes is Medical Director for [facility] Cardiac Rehabilitation.  Dr. Fuad Aleskerov is Medical Director for [facility] Pulmonary Rehabilitation.

## 2023-08-09 ENCOUNTER — Encounter: Admitting: *Deleted

## 2023-08-09 DIAGNOSIS — J849 Interstitial pulmonary disease, unspecified: Secondary | ICD-10-CM

## 2023-08-09 NOTE — Progress Notes (Signed)
 Daily Session Note  Patient Details  Name: Brian Casey MRN: [id number] Date of Birth: [birth year] Referring Provider:   Flowsheet Row Pulmonary Rehab from 06/02/2023 in [facility] Cardiac and Pulmonary Rehab  Referring Provider Dr. Alphonso Aschoff       Encounter Date: 08/09/2023  Check In:  Session Check In - 08/09/23 1118       Check-In   Supervising physician immediately available to respond to emergencies See telemetry face sheet for immediately available ER MD    Location [facility]-Cardiac & Pulmonary Rehab    Staff Present Maud Sorenson, RN, BSN, CCRP;Maxon [place] BS, Exercise Physiologist;Kelly [employer], ACSM CEP, Exercise Physiologist    Virtual Visit No    Medication changes reported     No    Fall or balance concerns reported    No    Warm-up and Cool-down Performed on first and last piece of equipment    Resistance Training Performed Yes    VAD Patient? No    PAD/SET Patient? No      Pain Assessment   Currently in Pain? No/denies                Social History   Tobacco Use  Smoking Status Former   Current packs/day: 0.00   Average packs/day: 1 pack/day for 48.0 years (48.0 ttl pk-yrs)   Types: Cigarettes   Start date: 11/07/1967   Quit date: 11/07/2015   Years since quitting: 7.7  Smokeless Tobacco Never    Goals Met:  Proper associated with RPD/PD & O2 Sat Independence with exercise equipment Exercise tolerated well No report of concerns or symptoms today  Goals Unmet:  Not Applicable  Comments: Pt able to follow exercise prescription today without complaint.  Will continue to monitor for progression.    Dr. Firman Hughes is Medical Director for [facility] Cardiac Rehabilitation.  Dr. Fuad Aleskerov is Medical Director for [facility] Pulmonary Rehabilitation.

## 2023-08-10 ENCOUNTER — Other Ambulatory Visit (HOSPITAL_COMMUNITY): Payer: Self-pay

## 2023-08-11 ENCOUNTER — Encounter: Admitting: *Deleted

## 2023-08-11 DIAGNOSIS — J849 Interstitial pulmonary disease, unspecified: Secondary | ICD-10-CM

## 2023-08-11 NOTE — Progress Notes (Signed)
 Daily Session Note  Patient Details  Name: Brian Casey MRN: [id number] Date of Birth: Jun 09, 1950 Referring Provider:   Flowsheet Row Pulmonary Rehab from 06/02/2023 in [facility] Cardiac and Pulmonary Rehab  Referring Provider Dr. Alphonso Aschoff       Encounter Date: 08/11/2023  Check In:  Session Check In - 08/11/23 1502       Check-In   Supervising physician immediately available to respond to emergencies See telemetry face sheet for immediately available ER MD    Location [facility]-Cardiac & Pulmonary Rehab    Staff Present Maud Sorenson, RN, BSN, CCRP;Maxon [place] BS, Exercise Physiologist;Noah Tickle, BS, Exercise Physiologist;Jason Martina Sledge RDN,LDN    Virtual Visit No    Medication changes reported     No    Fall or balance concerns reported    No    Warm-up and Cool-down Performed on first and last piece of equipment    Resistance Training Performed Yes    VAD Patient? No    PAD/SET Patient? No      Pain Assessment   Currently in Pain? No/denies                Social History   Tobacco Use  Smoking Status Former   Current packs/day: 0.00   Average packs/day: 1 pack/day for 48.0 years (48.0 ttl pk-yrs)   Types: Cigarettes   Start date: 11/07/1967   Quit date: 11/07/2015   Years since quitting: 7.7  Smokeless Tobacco Never    Goals Met:  Proper associated with RPD/PD & O2 Sat Independence with exercise equipment Exercise tolerated well No report of concerns or symptoms today  Goals Unmet:  Not Applicable  Comments: Pt able to follow exercise prescription today without complaint.  Will continue to monitor for progression.    Dr. Firman Hughes is Medical Director for [facility] Cardiac Rehabilitation.  Dr. Fuad Aleskerov is Medical Director for [facility] Pulmonary Rehabilitation.

## 2023-08-13 ENCOUNTER — Encounter: Admitting: *Deleted

## 2023-08-13 DIAGNOSIS — J849 Interstitial pulmonary disease, unspecified: Secondary | ICD-10-CM | POA: Diagnosis not present

## 2023-08-13 NOTE — Progress Notes (Signed)
 Daily Session Note  Patient Details  Name: Brian Casey MRN: [id number] Date of Birth: April 05, 1950 Referring Provider:   Flowsheet Row Pulmonary Rehab from 06/02/2023 in [facility] Cardiac and Pulmonary Rehab  Referring Provider Dr. Alphonso Aschoff       Encounter Date: 08/13/2023  Check In:  Session Check In - 08/13/23 1111       Check-In   Supervising physician immediately available to respond to emergencies See telemetry face sheet for immediately available ER MD    Location [facility]-Cardiac & Pulmonary Rehab    Staff Present Maud Sorenson, RN, BSN, CCRP;Joseph Hood RCP,RRT,BSRT;Maxon [place] BS, Exercise Physiologist;Noah Tickle, BS, Exercise Physiologist    Virtual Visit No    Medication changes reported     No    Fall or balance concerns reported    No    Warm-up and Cool-down Performed on first and last piece of equipment    Resistance Training Performed Yes    VAD Patient? No    PAD/SET Patient? No      Pain Assessment   Currently in Pain? No/denies                Social History   Tobacco Use  Smoking Status Former   Current packs/day: 0.00   Average packs/day: 1 pack/day for 48.0 years (48.0 ttl pk-yrs)   Types: Cigarettes   Start date: 11/07/1967   Quit date: 11/07/2015   Years since quitting: 7.7  Smokeless Tobacco Never    Goals Met:  Proper associated with RPD/PD & O2 Sat Independence with exercise equipment Exercise tolerated well No report of concerns or symptoms today  Goals Unmet:  Not Applicable  Comments: Pt able to follow exercise prescription today without complaint.  Will continue to monitor for progression.    Dr. Firman Hughes is Medical Director for [facility] Cardiac Rehabilitation.  Dr. Fuad Aleskerov is Medical Director for [facility] Pulmonary Rehabilitation.

## 2023-08-18 ENCOUNTER — Encounter: Admitting: *Deleted

## 2023-08-18 DIAGNOSIS — J849 Interstitial pulmonary disease, unspecified: Secondary | ICD-10-CM

## 2023-08-18 NOTE — Progress Notes (Signed)
 Daily Session Note  Patient Details  Name: Brian Casey MRN: [id number] Date of Birth: February 16, 1951 Referring Provider:   Flowsheet Row Pulmonary Rehab from 06/02/2023 in [facility] Cardiac and Pulmonary Rehab  Referring Provider Dr. Alphonso Aschoff       Encounter Date: 08/18/2023  Check In:  Session Check In - 08/18/23 1117       Check-In   Supervising physician immediately available to respond to emergencies See telemetry face sheet for immediately available ER MD    Location [facility]-Cardiac & Pulmonary Rehab    Staff Present Maud Sorenson, RN, BSN, CCRP;Joseph Hood RCP,RRT,BSRT;Maxon [place] BS, Exercise Physiologist;Noah Tickle, BS, Exercise Physiologist    Virtual Visit No    Medication changes reported     No    Fall or balance concerns reported    No    Warm-up and Cool-down Performed on first and last piece of equipment    Resistance Training Performed Yes    VAD Patient? No    PAD/SET Patient? No      Pain Assessment   Currently in Pain? No/denies                Social History   Tobacco Use  Smoking Status Former   Current packs/day: 0.00   Average packs/day: 1 pack/day for 48.0 years (48.0 ttl pk-yrs)   Types: Cigarettes   Start date: 11/07/1967   Quit date: 11/07/2015   Years since quitting: 7.7  Smokeless Tobacco Never    Goals Met:  Proper associated with RPD/PD & O2 Sat Independence with exercise equipment Exercise tolerated well No report of concerns or symptoms today  Goals Unmet:  Not Applicable  Comments: Pt able to follow exercise prescription today without complaint.  Will continue to monitor for progression.    Dr. Firman Hughes is Medical Director for [facility] Cardiac Rehabilitation.  Dr. Fuad Aleskerov is Medical Director for [facility] Pulmonary Rehabilitation.

## 2023-08-20 ENCOUNTER — Encounter: Admitting: *Deleted

## 2023-08-20 DIAGNOSIS — J849 Interstitial pulmonary disease, unspecified: Secondary | ICD-10-CM | POA: Diagnosis not present

## 2023-08-20 NOTE — Progress Notes (Signed)
 Daily Session Note  Patient Details  Name: Brian Casey MRN: [id number] Date of Birth: 07-22-1950 Referring Provider:   Flowsheet Row Pulmonary Rehab from 06/02/2023 in [facility] Cardiac and Pulmonary Rehab  Referring Provider Dr. Alphonso Aschoff       Encounter Date: 08/20/2023  Check In:  Session Check In - 08/20/23 1115       Check-In   Supervising physician immediately available to respond to emergencies See telemetry face sheet for immediately available ER MD    Location [facility]-Cardiac & Pulmonary Rehab    Staff Present Maud Sorenson, RN, BSN, CCRP;Joseph Hood RCP,RRT,BSRT;Maxon [place] BS, Exercise Physiologist;Noah Tickle, BS, Exercise Physiologist    Virtual Visit No    Medication changes reported     No    Fall or balance concerns reported    No    Warm-up and Cool-down Performed on first and last piece of equipment    Resistance Training Performed Yes    VAD Patient? No    PAD/SET Patient? No      Pain Assessment   Currently in Pain? No/denies                Social History   Tobacco Use  Smoking Status Former   Current packs/day: 0.00   Average packs/day: 1 pack/day for 48.0 years (48.0 ttl pk-yrs)   Types: Cigarettes   Start date: 11/07/1967   Quit date: 11/07/2015   Years since quitting: 7.7  Smokeless Tobacco Never    Goals Met:  Proper associated with RPD/PD & O2 Sat Independence with exercise equipment Exercise tolerated well No report of concerns or symptoms today  Goals Unmet:  Not Applicable  Comments: Pt able to follow exercise prescription today without complaint.  Will continue to monitor for progression.    Dr. Firman Hughes is Medical Director for [facility] Cardiac Rehabilitation.  Dr. Fuad Aleskerov is Medical Director for [facility] Pulmonary Rehabilitation.

## 2023-08-23 ENCOUNTER — Encounter: Attending: Internal Medicine

## 2023-08-23 DIAGNOSIS — Z87891 Personal history of nicotine dependence: Secondary | ICD-10-CM | POA: Insufficient documentation

## 2023-08-23 DIAGNOSIS — J849 Interstitial pulmonary disease, unspecified: Secondary | ICD-10-CM | POA: Diagnosis present

## 2023-08-23 NOTE — Progress Notes (Signed)
 Daily Session Note  Patient Details  Name: ALONTAE CHALOUX MRN: [id number] Date of Birth: 22-Aug-1950 Referring Provider:   Flowsheet Row Pulmonary Rehab from 06/02/2023 in [facility] Cardiac and Pulmonary Rehab  Referring Provider Dr. Alphonso Aschoff       Encounter Date: 08/23/2023  Check In:  Session Check In - 08/23/23 1111       Check-In   Supervising physician immediately available to respond to emergencies See telemetry face sheet for immediately available ER MD    Location [facility]-Cardiac & Pulmonary Rehab    Staff Present Lyell Samuel, MS, Exercise Physiologist;Farran Amsden Dawne Euler, ACSM CEP, Exercise Physiologist;Ysabella Babiarz RN,BSN,MPA;Joseph Hood RCP,RRT,BSRT    Virtual Visit No    Medication changes reported     No    Fall or balance concerns reported    No    Tobacco Cessation No Change    Warm-up and Cool-down Performed on first and last piece of equipment    Resistance Training Performed Yes    VAD Patient? No    PAD/SET Patient? No      Pain Assessment   Currently in Pain? No/denies                Social History   Tobacco Use  Smoking Status Former   Current packs/day: 0.00   Average packs/day: 1 pack/day for 48.0 years (48.0 ttl pk-yrs)   Types: Cigarettes   Start date: 11/07/1967   Quit date: 11/07/2015   Years since quitting: 7.7  Smokeless Tobacco Never    Goals Met:  Independence with exercise equipment Exercise tolerated well No report of concerns or symptoms today Strength training completed today  Goals Unmet:  Not Applicable  Comments: Pt able to follow exercise prescription today without complaint.  Will continue to monitor for progression.    Dr. Firman Hughes is Medical Director for [facility] Cardiac Rehabilitation.  Dr. Fuad Aleskerov is Medical Director for [facility] Pulmonary Rehabilitation.

## 2023-08-25 ENCOUNTER — Encounter: Payer: Self-pay | Admitting: *Deleted

## 2023-08-25 ENCOUNTER — Encounter

## 2023-08-25 DIAGNOSIS — J849 Interstitial pulmonary disease, unspecified: Secondary | ICD-10-CM | POA: Diagnosis not present

## 2023-08-25 NOTE — Progress Notes (Signed)
 Daily Session Note  Patient Details  Name: Brian Casey MRN: [id number] Date of Birth: [birth year] Referring Provider:   Flowsheet Row Pulmonary Rehab from 06/02/2023 in [facility] Cardiac and Pulmonary Rehab  Referring Provider Dr. Alphonso Aschoff       Encounter Date: 08/25/2023  Check In:  Session Check In - 08/25/23 1057       Check-In   Supervising physician immediately available to respond to emergencies See telemetry face sheet for immediately available ER MD    Location [facility]-Cardiac & Pulmonary Rehab    Staff Present Sherle Dire, BS, Exercise Physiologist;Jason Martina Sledge RDN,LDN;Shanice Poznanski RN,BSN,MPA;Joseph Lacinda Pica RCP,RRT,BSRT    Virtual Visit No    Medication changes reported     No    Fall or balance concerns reported    No    Tobacco Cessation No Change    Warm-up and Cool-down Performed on first and last piece of equipment    Resistance Training Performed Yes    VAD Patient? No    PAD/SET Patient? No      Pain Assessment   Currently in Pain? No/denies                Social History   Tobacco Use  Smoking Status Former   Current packs/day: 0.00   Average packs/day: 1 pack/day for 48.0 years (48.0 ttl pk-yrs)   Types: Cigarettes   Start date: 11/07/1967   Quit date: 11/07/2015   Years since quitting: 7.8  Smokeless Tobacco Never    Goals Met:  Independence with exercise equipment Exercise tolerated well No report of concerns or symptoms today Strength training completed today  Goals Unmet:  Not Applicable  Comments: Pt able to follow exercise prescription today without complaint.  Will continue to monitor for progression.    Dr. Firman Hughes is Medical Director for [facility] Cardiac Rehabilitation.  Dr. Fuad Aleskerov is Medical Director for [facility] Pulmonary Rehabilitation.

## 2023-08-25 NOTE — Progress Notes (Signed)
 Pulmonary Individual Treatment Plan  Patient Details  Name: Brian Casey MRN: [id number] Date of Birth: November 15, 1950 Referring Provider:   Flowsheet Row Pulmonary Rehab from 06/02/2023 in [facility] Cardiac and Pulmonary Rehab  Referring Provider Dr. Alphonso Aschoff       Initial Encounter Date:  Flowsheet Row Pulmonary Rehab from 06/02/2023 in [facility] Cardiac and Pulmonary Rehab  Date 06/02/23       Visit Diagnosis: ILD (interstitial lung disease) (HCC)  Patient's Home Medications on Admission:  Current Outpatient Medications:    aspirin  EC 81 MG tablet, Take 81 mg by mouth daily. Swallow whole., Disp: , Rfl:    atorvastatin  (LIPITOR) 40 MG tablet, Take 1 tablet (40 mg total) by mouth daily., Disp: 90 tablet, Rfl: 3   carvedilol  (COREG ) 12.5 MG tablet, Take 1 tablet (12.5 mg total) by mouth 2 (two) times daily with a meal., Disp: 180 tablet, Rfl: 3   empagliflozin (JARDIANCE) 10 MG TABS tablet, Take 25 mg by mouth daily., Disp: , Rfl:    ezetimibe  (ZETIA ) 10 MG tablet, Take 1 tablet (10 mg total) by mouth daily., Disp: 90 tablet, Rfl: 3   furosemide  (LASIX ) 20 MG tablet, Take 1 tablet (20 mg total) by mouth daily., Disp: 90 tablet, Rfl: 3   glucose blood test strip, Use to test blood sugar twice a day, Disp: 200 each, Rfl: 3   levothyroxine  (SYNTHROID ) 100 MCG tablet, Take 1 tablet (100 mcg total) by mouth, on an empty stomach, daily with a glass of water at least 30-60 minutes before breakfast., Disp: 90 tablet, Rfl: 3   levothyroxine  (SYNTHROID ) 100 MCG tablet, Take 1 tablet (100 mcg total) by mouth once daily on an empty stomach with a glass o water at least 30-60 minutes before breakfast, Disp: 90 tablet, Rfl: 3   loperamide (IMODIUM) 2 MG capsule, Take 2 mg by mouth as needed for diarrhea or loose stools. (Patient not taking: Reported on 05/31/2023), Disp: , Rfl:    NEOMYCIN -POLYMYXIN-HYDROCORTISONE (CORTISPORIN) 1 % SOLN OTIC solution, Apply 1-2 drops to toe BID after soaking, Disp: 10  mL, Rfl: 1   omeprazole  (PRILOSEC) 20 MG capsule, Take 1 capsule (20 mg total) by mouth daily., Disp: 90 capsule, Rfl: 3   ondansetron  (ZOFRAN ) 4 MG tablet, Take 1 tablet (4 mg total) by mouth every 8 (eight) hours as needed for nausea and/or vomitting. (Patient not taking: Reported on 05/31/2023), Disp: 20 tablet, Rfl: 3   OXYGEN, Inhale 4 L into the lungs daily., Disp: , Rfl:    Pirfenidone  801 MG TABS, Take 1 tablet (801 mg total) by mouth in the morning and at bedtime. **low dose as maintenance**, Disp: 180 tablet, Rfl: 1   PRECISION QID TEST test strip, Use 2 (two) times daily, Disp: , Rfl:    predniSONE  (DELTASONE ) 10 MG tablet, Take 1 tablet (10 mg total) by mouth daily with breakfast., Disp: 90 tablet, Rfl: 1   tadalafil  (CIALIS ) 10 MG tablet, Take 1 tablet (10 mg total) by mouth daily as needed for erectile dysfunction., Disp: 90 tablet, Rfl: 3  Past Medical History: Past Medical History:  Diagnosis Date   Adenomatous colon polyp    Aortic root dilatation (HCC)    Noted on echo in August 2023.  Measured 39 mm.   Arthritis    "lower spine; fingers" (11/07/2015)   CAD (coronary artery disease)    a. 10/2015 NSTEMI/Cath: LM 40, LAD 70p, LCX 90ost, RI 90ost, RCA 60-70p, 90-16m/d, 40-50d; b. 10/2015 CABG x 4: LIMA->LAD,  VG->Diag, VG->RI, VG->RPDA.   Chronic respiratory failure with hypoxia (HCC)    CKD (chronic kidney disease), stage III (HCC)    Diastolic dysfunction    a. 10/2021 Echo: EF 60-65%,   Diverticulosis    GERD (gastroesophageal reflux disease)    Grade I diastolic dysfunction    High cholesterol    History of bronchitis    Hypertension 10/17/2013   Hypothyroidism    ILD (interstitial lung disease) (HCC)    Mild pulmonary hypertension (HCC)    PAD (peripheral artery disease) (HCC)    s/p left carotid endarterectomy   Post-op atrial fibrillation (HCC) 11/06/2015   Shortness of breath    Type II diabetes mellitus (HCC)    type II   Wears glasses     Tobacco  Use: Social History   Tobacco Use  Smoking Status Former   Current packs/day: 0.00   Average packs/day: 1 pack/day for 48.0 years (48.0 ttl pk-yrs)   Types: Cigarettes   Start date: 11/07/1967   Quit date: 11/07/2015   Years since quitting: 7.8  Smokeless Tobacco Never    Labs: Review Flowsheet  More data exists      Latest Ref Rng & Units 11/14/2015 11/15/2015 01/15/2016 02/03/2022 05/25/2023  Labs for ITP Cardiac and Pulmonary Rehab  Cholestrol 100 - 199 mg/dL - - - - 161   LDL (calc) 0 - 99 mg/dL - - - - 88   HDL-C >09 mg/dL - - - - 59   Trlycerides 0 - 149 mg/dL - - - - 604   Hemoglobin A1c 4.8 - 5.6 % - - 5.5  - -  PH, Arterial 7.350 - 7.450 7.361  7.308  7.366  7.421  7.352  7.361  - - -  PCO2 arterial 35.0 - 45.0 mmHg 40.3  52.3  38.2  39.5  38.4  39.3  - - -  Bicarbonate 20.0 - 28.0 mmol/L 23.0  26.3  21.9  25.7  21.4  22.5  - 26.2  -  TCO2 22 - 32 mmol/L 24  23  28  23  22  24  24  27  26  26  25  28  25  22  23  24   - 27  -  Acid-base deficit 0.0 - 2.0 mmol/L 2.0  3.0  4.0  3.0  - - -  O2 Saturation % 97.0  100.0  99.0  100.0  61.7  95.0  98.0  - 72  -    Details       Multiple values from one day are sorted in reverse-chronological order          Pulmonary Assessment Scores:  Pulmonary Assessment Scores     Row Name 06/02/23 1554         mMRC Score   mMRC Score 1              UCSD: Self-administered rating of dyspnea associated with activities of daily living (ADLs) 6-point scale (0 = "not at all" to 5 = "maximal or unable to do because of breathlessness")  Scoring Scores range from 0 to 120.  Minimally important difference is 5 units  CAT: CAT can identify the health impairment of COPD patients and is better correlated with disease progression.  CAT has a scoring range of zero to 40. The CAT score is classified into four groups of low (less than 10), medium (10 - 20), high (21-30) and very high (31-40) based on the impact level  of disease on  health status. A CAT score over 10 suggests significant symptoms.  A worsening CAT score could be explained by an exacerbation, poor medication adherence, poor inhaler technique, or progression of COPD or comorbid conditions.  CAT MCID is 2 points  mMRC: mMRC (Modified Medical Research Council) Dyspnea Scale is used to assess the degree of baseline functional disability in patients of respiratory disease due to dyspnea. No minimal important difference is established. A decrease in score of 1 point or greater is considered a positive change.   Pulmonary Function Assessment:  Pulmonary Function Assessment - 05/31/23 0940       Breath   Shortness of Breath Yes;Fear of Shortness of Breath;Limiting activity             Exercise Target Goals: Exercise Program Goal: Individual exercise prescription set using results from initial 6 min walk test and THRR while considering  patient's activity barriers and safety.   Exercise Prescription Goal: Initial exercise prescription builds to 30-45 minutes a day of aerobic activity, 2-3 days per week.  Home exercise guidelines will be given to patient during program as part of exercise prescription that the participant will acknowledge.  Education: Aerobic Exercise: - Group verbal and visual presentation on the components of exercise prescription. Introduces F.I.T.T principle from ACSM for exercise prescriptions.  Reviews F.I.T.T. principles of aerobic exercise including progression. Written material given at graduation. Flowsheet Row Cardiac Rehab from 04/01/2016 in [facility] Cardiac and Pulmonary Rehab  Date 02/17/16  Educator [facility]  Instruction Review Code (retired) 2- meets goals/outcomes       Education: Resistance Exercise: - Group verbal and visual presentation on the components of exercise prescription. Introduces F.I.T.T principle from ACSM for exercise prescriptions  Reviews F.I.T.T. principles of resistance exercise including progression. Written  material given at graduation. Flowsheet Row Pulmonary Rehab from 12/17/2021 in [facility] Cardiac and Pulmonary Rehab  Date 12/03/21  Educator NT  Instruction Review Code 1- [employer] Understanding        Education: Exercise & Equipment Safety: - Individual verbal instruction and demonstration of equipment use and safety with use of the equipment. Flowsheet Row Pulmonary Rehab from 06/16/2023 in [facility] Cardiac and Pulmonary Rehab  Date 06/02/23  Educator [facility]  Instruction Review Code 1- Verbalizes Understanding       Education: Exercise Physiology & General Exercise Guidelines: - Group verbal and written instruction with models to review the exercise physiology of the cardiovascular system and associated critical values. Provides general exercise guidelines with specific guidelines to those with heart or lung disease.    Education: Flexibility, Balance, Mind/Body Relaxation: - Group verbal and visual presentation with interactive activity on the components of exercise prescription. Introduces F.I.T.T principle from ACSM for exercise prescriptions. Reviews F.I.T.T. principles of flexibility and balance exercise training including progression. Also discusses the mind body connection.  Reviews various relaxation techniques to help reduce and manage stress (i.e. Deep breathing, progressive muscle relaxation, and visualization). Balance handout provided to take home. Written material given at graduation. Flowsheet Row Pulmonary Rehab from 12/17/2021 in [facility] Cardiac and Pulmonary Rehab  Date 12/03/21  Educator NT  Instruction Review Code 1- Verbalizes Understanding       Activity Barriers & Risk Stratification:  Activity Barriers & Cardiac Risk Stratification - 06/02/23 1544       Activity Barriers & Cardiac Risk Stratification   Activity Barriers Deconditioning;Shortness of Breath;Balance Concerns             6 Minute Walk:  6 Minute Walk  Row Name 06/02/23 1540         6  Minute Walk   Phase Initial     Distance 900 feet     Walk Time 6 minutes     # of Rest Breaks 0     MPH 1.7     METS 2.26     RPE 13     Perceived Dyspnea  2     VO2 Peak 7.9     Symptoms No     Resting HR 73 bpm     Resting BP 104/62     Resting Oxygen Saturation  91 %     Exercise Oxygen Saturation  during 6 min walk 86 %     Max Ex. HR 106 bpm     Max Ex. BP 110/64     2 Minute Post BP 108/62       Interval HR   1 Minute HR 80     2 Minute HR 90     4 Minute HR 64     5 Minute HR 71     6 Minute HR 106     2 Minute Post HR 82     Interval Heart Rate? Yes       Interval Oxygen   Interval Oxygen? Yes     Baseline Oxygen Saturation % 91 %     1 Minute Oxygen Saturation % 93 %     1 Minute Liters of Oxygen 4 L     2 Minute Oxygen Saturation % 93 %     2 Minute Liters of Oxygen 4 L     3 Minute Oxygen Saturation % 91 %     3 Minute Liters of Oxygen 4 L     4 Minute Oxygen Saturation % 89 %     4 Minute Liters of Oxygen 4 L     5 Minute Oxygen Saturation % 87 %     5 Minute Liters of Oxygen 4 L     6 Minute Oxygen Saturation % 86 %     6 Minute Liters of Oxygen 4 L     2 Minute Post Oxygen Saturation % 97 %     2 Minute Post Liters of Oxygen 4 L             Oxygen Initial Assessment:  Oxygen Initial Assessment - 06/14/23 1623       Home Oxygen   Home Oxygen Device Home Concentrator;E-Tanks    Sleep Oxygen Prescription Continuous    Liters per minute 2    Home Exercise Oxygen Prescription Continuous    Liters per minute 4    Home Resting Oxygen Prescription None    Compliance with Home Oxygen Use Yes      Intervention   Short Term Goals --             Oxygen Re-Evaluation:  Oxygen Re-Evaluation     Row Name 06/14/23 1623 07/14/23 1107 08/23/23 1138         Program Oxygen Prescription   Program Oxygen Prescription -- Continuous;E-Tanks Continuous;E-Tanks     Liters per minute -- 4 4     Comments -- -- On oxygen for exercise.       Home  Oxygen   Home Oxygen Device -- Home Concentrator;E-Tanks Home Concentrator;E-Tanks     Sleep Oxygen Prescription -- Continuous Continuous     Liters per minute -- 2 2     Home Exercise Oxygen  Prescription -- -- Continuous     Liters per minute -- 4 4     Home Resting Oxygen Prescription -- None None     Compliance with Home Oxygen Use -- Yes Yes       Goals/Expected Outcomes   Short Term Goals -- To learn and understand importance of maintaining oxygen saturations>88%;To learn and understand importance of monitoring SPO2 with pulse oximeter and demonstrate accurate use of the pulse oximeter. To learn and demonstrate proper pursed lip breathing techniques or other breathing techniques.      Long  Term Goals Exhibits proper breathing techniques, such as pursed lip breathing or other method taught during program session Verbalizes importance of monitoring SPO2 with pulse oximeter and return demonstration;Maintenance of O2 saturations>88% Exhibits proper breathing techniques, such as pursed lip breathing or other method taught during program session     Comments Reviewed PLB technique with pt.  Talked about how it works and it's importance in maintaining their exercise saturations. He has a pulse oximeter to check his oxygen saturation at home. Informed and explained why it is important to have one. Reviewed that oxygen saturations should be 88 percent and above. Patient verbalizes understanding. Diaphragmatic and PLB breathing explained and performed with patient. Patient has a better understanding of how to do these exercises to help with breathing performance and relaxation. Patient performed breathing techniques adequately and to practice further at home.     Goals/Expected Outcomes Short: Become more profiecient at using PLB. Long: Become independent at using PLB. Short: monitor oxygen at home with exertion. Long: maintain oxygen saturations above 88 percent independently. Short: practice PLB and  diaphragmatic breathing at home. Long: Use PLB and diaphragmatic breathing independently post [employer].              Oxygen Discharge (Final Oxygen Re-Evaluation):  Oxygen Re-Evaluation - 08/23/23 1138       Program Oxygen Prescription   Program Oxygen Prescription Continuous;E-Tanks    Liters per minute 4    Comments On oxygen for exercise.      Home Oxygen   Home Oxygen Device Home Concentrator;E-Tanks    Sleep Oxygen Prescription Continuous    Liters per minute 2    Home Exercise Oxygen Prescription Continuous    Liters per minute 4    Home Resting Oxygen Prescription None    Compliance with Home Oxygen Use Yes      Goals/Expected Outcomes   Short Term Goals To learn and demonstrate proper pursed lip breathing techniques or other breathing techniques.     Long  Term Goals Exhibits proper breathing techniques, such as pursed lip breathing or other method taught during program session    Comments Diaphragmatic and PLB breathing explained and performed with patient. Patient has a better understanding of how to do these exercises to help with breathing performance and relaxation. Patient performed breathing techniques adequately and to practice further at home.    Goals/Expected Outcomes Short: practice PLB and diaphragmatic breathing at home. Long: Use PLB and diaphragmatic breathing independently post [employer].             Initial Exercise Prescription:  Initial Exercise Prescription - 06/02/23 1500       Date of Initial Exercise RX and Referring Provider   Date 06/02/23    Referring Provider Dr. Alphonso Aschoff      Oxygen   Oxygen Continuous    Liters 4    Maintain Oxygen Saturation 88% or higher      Recumbant  Bike   Level 1    RPM 50    Watts 25    Minutes 15    METs 2.26      NuStep   Level 1    SPM 80    Minutes 15    METs 2.26      Arm Ergometer   Level 1    RPM 30    Minutes 15    METs 2.26      T5 Nustep   Level 1    SPM 80     Minutes 15    METs 2.26      Track   Laps 23    Minutes 15    METs 2.25      Intensity   THRR 40-80% of Max Heartrate 103-133    Ratings of Perceived Exertion 11-13    Perceived Dyspnea 0-4      Resistance Training   Training Prescription Yes    Weight 4 lb    Reps 10-15             Perform Capillary Blood Glucose checks as needed.  Exercise Prescription Changes:   Exercise Prescription Changes     Row Name 06/02/23 1500 06/30/23 1500 07/15/23 1500 07/19/23 1100 07/27/23 1400     Response to Exercise   Blood Pressure (Admit) 104/62 94/54 104/62 -- 104/58   Blood Pressure (Exercise) 110/64 138/70 -- -- --   Blood Pressure (Exit) 108/62 96/50 128/66 -- 104/58   Heart Rate (Admit) 73 bpm 70 bpm 67 bpm -- 74 bpm   Heart Rate (Exercise) 106 bpm 98 bpm 98 bpm -- 89 bpm   Heart Rate (Exit) 82 bpm 84 bpm 82 bpm -- 82 bpm   Oxygen Saturation (Admit) 91 % 98 % 96 % -- 98 %   Oxygen Saturation (Exercise) 86 % 92 % 90 % -- 91 %   Oxygen Saturation (Exit) 97 % 97 % 96 % -- 97 %   Rating of Perceived Exertion (Exercise) 13 15 12  -- 12   Perceived Dyspnea (Exercise) 2 3 1  -- 1   Symptoms none none none -- none   Comments results first 2weeks of exercise -- -- --   Duration -- Progress to 30 minutes of  aerobic without signs/symptoms of physical distress Continue with 30 min of aerobic exercise without signs/symptoms of physical distress. Continue with 30 min of aerobic exercise without signs/symptoms of physical distress. Continue with 30 min of aerobic exercise without signs/symptoms of physical distress.   Intensity -- THRR unchanged THRR unchanged THRR unchanged THRR unchanged     Progression   Progression -- Continue to progress workloads to maintain intensity without signs/symptoms of physical distress. Continue to progress workloads to maintain intensity without signs/symptoms of physical distress. Continue to progress workloads to maintain intensity without  signs/symptoms of physical distress. Continue to progress workloads to maintain intensity without signs/symptoms of physical distress.   Average METs 2.26 2.45 2.83 2.83 2.4     Resistance Training   Training Prescription -- Yes Yes Yes Yes   Weight -- 4lb 3 lb 3 lb 3 lb   Reps -- 10-15 10-15 10-15 10-15     Interval Training   Interval Training No No No No No     Oxygen   Oxygen -- Continuous Continuous Continuous Continuous   Liters -- 4 4 4 4      Recumbant Bike   Level -- 2 2 2 2    Watts --  25 25 25 18    Minutes -- 15 10 10 15    METs -- 3.1 3.07 3.07 --     NuStep   Level -- 3 2 2 3    Minutes -- 15 15 15 15    METs -- 2.9 3 3  --     REL-XR   Level -- -- 2 2 4    Minutes -- -- 15 15 15    METs -- -- 3.7 3.7 2.8     T5 Nustep   Level -- 2 2 2 3    Minutes -- 15 15 15 15    METs -- 1.8 2 2  1.9     Biostep-RELP   Level -- 2 -- -- --   Minutes -- 15 -- -- --   METs -- 3 -- -- --     Track   Laps -- 9 -- -- --   Minutes -- 15 -- -- --   METs -- 1.49 -- -- --     Home Exercise Plan   Plans to continue exercise at -- -- -- Home (comment)  walk, look into gym membership, strength and stretching videos. Home (comment)  walk, look into gym membership, strength and stretching videos.   Frequency -- -- -- Add 2 additional days to program exercise sessions. Add 2 additional days to program exercise sessions.   Initial Home Exercises Provided -- -- -- 07/19/23 07/19/23     Oxygen   Maintain Oxygen Saturation -- 88% or higher 88% or higher 88% or higher 88% or higher    Row Name 08/12/23 1400             Response to Exercise   Blood Pressure (Admit) 102/60       Blood Pressure (Exit) 100/54       Heart Rate (Admit) 75 bpm       Heart Rate (Exercise) 87 bpm       Heart Rate (Exit) 78 bpm       Oxygen Saturation (Admit) 97 %       Oxygen Saturation (Exercise) 91 %       Oxygen Saturation (Exit) 97 %       Rating of Perceived Exertion (Exercise) 12       Perceived  Dyspnea (Exercise) 0       Symptoms none       Duration Continue with 30 min of aerobic exercise without signs/symptoms of physical distress.       Intensity THRR unchanged         Progression   Progression Continue to progress workloads to maintain intensity without signs/symptoms of physical distress.       Average METs 2.72         Resistance Training   Training Prescription Yes       Weight 3 lb       Reps 10-15         Interval Training   Interval Training No         Oxygen   Oxygen Continuous       Liters 4         NuStep   Level 2       Minutes 15       METs 3         REL-XR   Level 3       Minutes 15       METs 3.9         T5 Nustep   Level 3  Minutes 15       METs 2         Home Exercise Plan   Plans to continue exercise at Home (comment)  walk, look into gym membership, strength and stretching videos.       Frequency Add 2 additional days to program exercise sessions.       Initial Home Exercises Provided 07/19/23         Oxygen   Maintain Oxygen Saturation 88% or higher                Exercise Comments:   Exercise Comments     Row Name 06/14/23 1622           Exercise Comments First full day of exercise!  Patient was oriented to gym and equipment including functions, settings, policies, and procedures.  Patient's individual exercise prescription and treatment plan were reviewed.  All starting workloads were established based on the results of the 6 minute walk test done at initial orientation visit.  The plan for exercise progression was also introduced and progression will be customized based on patient's performance and goals.                Exercise Goals and Review:   Exercise Goals     Row Name 06/02/23 1550             Exercise Goals   Increase Physical Activity Yes       Intervention Provide advice, education, support and counseling about physical activity/exercise needs.;Develop an individualized exercise  prescription for aerobic and resistive training based on initial evaluation findings, risk stratification, comorbidities and participant's personal goals.       Expected Outcomes Short Term: Attend rehab on a regular basis to increase amount of physical activity.;Long Term: Add in home exercise to make exercise part of routine and to increase amount of physical activity.;Long Term: Exercising regularly at least 3-5 days a week.       Increase Strength and Stamina Yes       Intervention Provide advice, education, support and counseling about physical activity/exercise needs.;Develop an individualized exercise prescription for aerobic and resistive training based on initial evaluation findings, risk stratification, comorbidities and participant's personal goals.       Expected Outcomes Short Term: Increase workloads from initial exercise prescription for resistance, speed, and METs.;Short Term: Perform resistance training exercises routinely during rehab and add in resistance training at home;Long Term: Improve cardiorespiratory fitness, muscular endurance and strength as measured by increased METs and functional capacity ( )       Able to understand and use rate of perceived exertion (RPE) scale Yes       Intervention Provide education and explanation on how to use RPE scale       Expected Outcomes Short Term: Able to use RPE daily in rehab to express subjective intensity level       Able to understand and use Dyspnea scale Yes       Intervention Provide education and explanation on how to use Dyspnea scale       Expected Outcomes Short Term: Able to use Dyspnea scale daily in rehab to express subjective sense of shortness of breath during exertion;Long Term: Able to use Dyspnea scale to guide intensity level when exercising independently       Knowledge and understanding of Target Heart Rate Range (THRR) Yes       Intervention Provide education and explanation of THRR including how the numbers were  predicted  and where they are located for reference       Expected Outcomes Short Term: Able to state/look up THRR;Short Term: Able to use daily as guideline for intensity in rehab;Long Term: Able to use THRR to govern intensity when exercising independently       Able to check pulse independently Yes       Intervention Provide education and demonstration on how to check pulse in carotid and radial arteries.;Review the importance of being able to check your own pulse for safety during independent exercise       Expected Outcomes Long Term: Able to check pulse independently and accurately;Short Term: Able to explain why pulse checking is important during independent exercise       Understanding of Exercise Prescription Yes       Intervention Provide education, explanation, and written materials on patient's individual exercise prescription       Expected Outcomes Short Term: Able to explain program exercise prescription;Long Term: Able to explain home exercise prescription to exercise independently                Exercise Goals Re-Evaluation :  Exercise Goals Re-Evaluation     Row Name 06/14/23 1622 06/30/23 1550 07/15/23 1536 07/19/23 1135 07/27/23 1417     Exercise Goal Re-Evaluation   Exercise Goals Review -- Increase Physical Activity;Increase Strength and Stamina;Understanding of Exercise Prescription Increase Physical Activity;Increase Strength and Stamina;Understanding of Exercise Prescription Increase Physical Activity;Increase Strength and Stamina;Able to understand and use rate of perceived exertion (RPE) scale;Able to understand and use Dyspnea scale;Knowledge and understanding of Target Heart Rate Range (THRR);Able to check pulse independently;Understanding of Exercise Prescription Increase Physical Activity;Understanding of Exercise Prescription;Increase Strength and Stamina   Comments Reviewed RPE and dyspnea scale, THR and program prescription with pt today.  Pt voiced  understanding and was given a copy of goals to take home. Javiel is off to a good start in the program. He was able to attend his first 6 sessions during this review period. During those few sessions he was able to increase both the T5 nustep, and recumbent bike to level 2. He was also able to increase to level 3 on the T4 nustep. We will continue to monitor his progress in the program. Jarl is doing well in rehab. He began using the XR at level 2 and continued at level 2 on all other seated machines. He also continues to do well with keeping his O2 saturations above 88% during exercise. We will continue to monitor his progress in the program. Reviewed home exercise with pt today from 11:15am to 11:25am.  Pt plans to walk, look into a gym membership, and do strength and stretching videos at home for exercise.  Reviewed THR, pulse, RPE, sign and symptoms, pulse oximetery and when to call 911 or MD.  Also discussed weather considerations and indoor options.  Pt voiced understanding. Linc continues to do well in rehab. He increased to level 3 on the T4 and T5 nusteps. He also increased to level 4 on the XR. We will continue to monitor his progress in the program.   Expected Outcomes Short: Use RPE daily to regulate intensity. Long: Follow program prescription in THR. Short: Continue to follow exercise prescription. Long: Continue exercise to improve strength and stamina. Short: Continue to progressively increase workloads. Long: Continue exercise to improve strength and stamina. Short: add 1-2 days a week of exercise at home on off days of pulmonary rehab. Long: maintain independent exercise routine upon graduation  from pulmonary rehab. Short: Continue to progressively increase nustep and XR workloads. Long: Continue exercise to improve strength and stamina.    Row Name 08/12/23 1434             Exercise Goal Re-Evaluation   Exercise Goals Review Increase Physical Activity;Understanding of Exercise  Prescription;Increase Strength and Stamina       Comments Kinley is doing well in the program. He was able to maintain level 3 on both the T5 nustep and the XR. He was also able to use the T4 nustep at level 2. We will continue to monitor his progress in the program.       Expected Outcomes Short: Continue to progressively increase nustep and XR workloads. Long: Continue exercise to improve strength and stamina.                Discharge Exercise Prescription (Final Exercise Prescription Changes):  Exercise Prescription Changes - 08/12/23 1400       Response to Exercise   Blood Pressure (Admit) 102/60    Blood Pressure (Exit) 100/54    Heart Rate (Admit) 75 bpm    Heart Rate (Exercise) 87 bpm    Heart Rate (Exit) 78 bpm    Oxygen Saturation (Admit) 97 %    Oxygen Saturation (Exercise) 91 %    Oxygen Saturation (Exit) 97 %    Rating of Perceived Exertion (Exercise) 12    Perceived Dyspnea (Exercise) 0    Symptoms none    Duration Continue with 30 min of aerobic exercise without signs/symptoms of physical distress.    Intensity THRR unchanged      Progression   Progression Continue to progress workloads to maintain intensity without signs/symptoms of physical distress.    Average METs 2.72      Resistance Training   Training Prescription Yes    Weight 3 lb    Reps 10-15      Interval Training   Interval Training No      Oxygen   Oxygen Continuous    Liters 4      NuStep   Level 2    Minutes 15    METs 3      REL-XR   Level 3    Minutes 15    METs 3.9      T5 Nustep   Level 3    Minutes 15    METs 2      Home Exercise Plan   Plans to continue exercise at Home (comment)   walk, look into gym membership, strength and stretching videos.   Frequency Add 2 additional days to program exercise sessions.    Initial Home Exercises Provided 07/19/23      Oxygen   Maintain Oxygen Saturation 88% or higher             Nutrition:  Target Goals: Understanding of  nutrition guidelines, daily intake of sodium 1500mg , cholesterol 200mg , calories 30% from fat and 7% or less from saturated fats, daily to have 5 or more servings of fruits and vegetables.  Education: All About Nutrition: -Group instruction provided by verbal, written material, interactive activities, discussions, models, and posters to present general guidelines for heart healthy nutrition including fat, fiber, MyPlate, the role of sodium in heart healthy nutrition, utilization of the nutrition label, and utilization of this knowledge for meal planning. Follow up email sent as well. Written material given at graduation. Flowsheet Row Pulmonary Rehab from 06/16/2023 in [facility] Cardiac and Pulmonary Rehab  Education  need identified 06/02/23  Date 06/16/23  Educator JG  Instruction Review Code 1- Verbalizes Understanding       Biometrics:  Pre Biometrics - 06/02/23 1550       Pre Biometrics   Height 5\' 8"  (1.727 m)    Weight 158 lb 6.4 oz (71.8 kg)    Waist Circumference 34 inches    Hip Circumference 36.5 inches    Waist to Hip Ratio 0.93 %    BMI (Calculated) 24.09    Single Leg Stand 5.84 seconds              Nutrition Therapy Plan and Nutrition Goals:   Nutrition Assessments:  MEDIFICTS Score Key: >=70 Need to make dietary changes  40-70 Heart Healthy Diet <= 40 Therapeutic Level Cholesterol Diet  Flowsheet Row Pulmonary Rehab from 06/02/2023 in [facility] Cardiac and Pulmonary Rehab  Picture Your Plate Total Score on Admission 59      Picture Your Plate Scores: <16 Unhealthy dietary pattern with much room for improvement. 41-50 Dietary pattern unlikely to meet recommendations for good health and room for improvement. 51-60 More healthful dietary pattern, with some room for improvement.  >60 Healthy dietary pattern, although there may be some specific behaviors that could be improved.   Nutrition Goals Re-Evaluation:  Nutrition Goals Re-Evaluation     Row Name  07/14/23 1111 08/23/23 1138           Goals   Current Weight 157 lb (71.2 kg) --      Comment Patient was informed on why it is important to maintain a balanced diet when dealing with Respiratory issues. Explained that it takes a lot of energy to breath and when they are short of breath often they will need to have a good diet to help keep up with the calories they are expending for breathing. Deferred RD appointment      Expected Outcome Short: Choose and plan snacks accordingly to patients caloric intake to improve breathing. Long: Maintain a diet independently that meets their caloric intake to aid in daily shortness of breath. --               Nutrition Goals Discharge (Final Nutrition Goals Re-Evaluation):  Nutrition Goals Re-Evaluation - 08/23/23 1138       Goals   Comment Deferred RD appointment             Psychosocial: Target Goals: Acknowledge presence or absence of significant depression and/or stress, maximize coping skills, provide positive support system. Participant is able to verbalize types and ability to use techniques and skills needed for reducing stress and depression.   Education: Stress, Anxiety, and Depression - Group verbal and visual presentation to define topics covered.  Reviews how body is impacted by stress, anxiety, and depression.  Also discusses healthy ways to reduce stress and to treat/manage anxiety and depression.  Written material given at graduation. Flowsheet Row Cardiac Rehab from 04/01/2016 in [facility] Cardiac and Pulmonary Rehab  Date 04/01/16  Educator [facility]  Instruction Review Code (retired) 2- meets goals/outcomes       Education: Sleep Hygiene -Provides group verbal and written instruction about how sleep can affect your health.  Define sleep hygiene, discuss sleep cycles and impact of sleep habits. Review good sleep hygiene tips.    Initial Review & Psychosocial Screening:  Initial Psych Review & Screening - 05/31/23 1096        Initial Review   Current issues with None Identified  Family Dynamics   Good Support System? Yes    Comments His wife and two children are a good support system for him. He has five grand kids. He is more angry at the fact that his disease does not let him do the things he wants to do.      Barriers   Psychosocial barriers to participate in program The patient should benefit from training in stress management and relaxation.;There are no identifiable barriers or psychosocial needs.      Screening Interventions   Interventions Encouraged to exercise;Provide feedback about the scores to participant;To provide support and resources with identified psychosocial needs    Expected Outcomes Short Term goal: Utilizing psychosocial counselor, staff and physician to assist with identification of specific Stressors or current issues interfering with healing process. Setting desired goal for each stressor or current issue identified.;Long Term Goal: Stressors or current issues are controlled or eliminated.;Short Term goal: Identification and review with participant of any Quality of Life or Depression concerns found by scoring the questionnaire.;Long Term goal: The participant improves quality of Life and PHQ9 Scores as seen by post scores and/or verbalization of changes             Quality of Life Scores:  Scores of 19 and below usually indicate a poorer quality of life in these areas.  A difference of  2-3 points is a clinically meaningful difference.  A difference of 2-3 points in the total score of the Quality of Life Index has been associated with significant improvement in overall quality of life, self-image, physical symptoms, and general health in studies assessing change in quality of life.  PHQ-9: Review Flowsheet  More data exists      06/02/2023 01/05/2022 12/12/2021 09/29/2021 03/25/2016  Depression screen PHQ 2/9  Decreased Interest 0 3 2 0 0  Down, Depressed, Hopeless 0 2 1 1  0  PHQ  - 2 Score 0 5 3 1  0  Altered sleeping 0 1 3 3 1   Tired, decreased energy 0 3 1 2  0  Change in appetite 0 3 1 1  0  Feeling bad or failure about yourself  0 2 0 1 0  Trouble concentrating 0 1 0 0 0  Moving slowly or fidgety/restless 0 1 0 0 0  Suicidal thoughts 0 0 0 0 0  PHQ-9 Score 0 16 8 8 1   Difficult doing work/chores Not difficult at all Not difficult at all Somewhat difficult Somewhat difficult Not difficult at all   Interpretation of Total Score  Total Score Depression Severity:  1-4 = Minimal depression, 5-9 = Mild depression, 10-14 = Moderate depression, 15-19 = Moderately severe depression, 20-27 = Severe depression   Psychosocial Evaluation and Intervention:  Psychosocial Evaluation - 05/31/23 0944       Psychosocial Evaluation & Interventions   Interventions Encouraged to exercise with the program and follow exercise prescription;Relaxation education;Stress management education    Comments His wife and two children are a good support system for him. He has five grand kids. He is more angry at the fact that his disease does not let him do the things he wants to do.    Expected Outcomes Short: Attend [facility] stress management education to decrease stress. Long: Maintain exercise Post [facility] to keep stress at a minimum.    Continue Psychosocial Services  Follow up required by staff             Psychosocial Re-Evaluation:  Psychosocial Re-Evaluation     Row Name 07/14/23  1112 08/23/23 1142           Psychosocial Re-Evaluation   Current issues with Current Depression Current Depression      Comments Davidson is more depressed than he was becasue he sees that he is not improving with his breathing. There is things that he is realizing that he cant do anymore and is trying to make the best of it. Ules states that he has felt a little better and he has come to terms that his lung health is not going to improve. He want to go to the beach and do some other things but  cant since his oxygen gets low on exertion.      Expected Outcomes Short: Attend [facility] stress management education to decrease stress. Long: Maintain exercise Post [facility] to keep stress at a minimum. Short: maintain a healthy mental state. Long: keep a healthy mental state.      Interventions Encouraged to attend Pulmonary Rehabilitation for the exercise Encouraged to attend Pulmonary Rehabilitation for the exercise      Continue Psychosocial Services  Follow up required by staff Follow up required by staff               Psychosocial Discharge (Final Psychosocial Re-Evaluation):  Psychosocial Re-Evaluation - 08/23/23 1142       Psychosocial Re-Evaluation   Current issues with Current Depression    Comments Kevan states that he has felt a little better and he has come to terms that his lung health is not going to improve. He want to go to the beach and do some other things but cant since his oxygen gets low on exertion.    Expected Outcomes Short: maintain a healthy mental state. Long: keep a healthy mental state.    Interventions Encouraged to attend Pulmonary Rehabilitation for the exercise    Continue Psychosocial Services  Follow up required by staff             Education: Education Goals: Education classes will be provided on a weekly basis, covering required topics. Participant will state understanding/return demonstration of topics presented.  Learning Barriers/Preferences:  Learning Barriers/Preferences - 05/31/23 0940       Learning Barriers/Preferences   Learning Barriers Sight    Learning Preferences None             General Pulmonary Education Topics:  Infection Prevention: - Provides verbal and written material to individual with discussion of infection control including proper hand washing and proper equipment cleaning during exercise session. Flowsheet Row Pulmonary Rehab from 06/16/2023 in [facility] Cardiac and Pulmonary Rehab  Date 06/02/23   Educator [facility]  Instruction Review Code 1- Verbalizes Understanding       Falls Prevention: - Provides verbal and written material to individual with discussion of falls prevention and safety. Flowsheet Row Pulmonary Rehab from 06/16/2023 in [facility] Cardiac and Pulmonary Rehab  Date 06/02/23  Educator [facility]  Instruction Review Code 1- Verbalizes Understanding       Chronic Lung Disease Review: - Group verbal instruction with posters, models, PowerPoint presentations and videos,  to review new updates, new respiratory medications, new advancements in procedures and treatments. Providing information on websites and "800" numbers for continued self-education. Includes information about supplement oxygen, available portable oxygen systems, continuous and intermittent flow rates, oxygen safety, concentrators, and Medicare reimbursement for oxygen. Explanation of Pulmonary Drugs, including class, frequency, complications, importance of spacers, rinsing mouth after steroid MDI's, and proper cleaning methods for nebulizers. Review of basic lung anatomy and  physiology related to function, structure, and complications of lung disease. Review of risk factors. Discussion about methods for diagnosing sleep apnea and types of masks and machines for OSA. Includes a review of the use of types of environmental controls: home humidity, furnaces, filters, dust mite/pet prevention, HEPA vacuums. Discussion about weather changes, air quality and the benefits of nasal washing. Instruction on Warning signs, infection symptoms, calling MD promptly, preventive modes, and value of vaccinations. Review of effective airway clearance, coughing and/or vibration techniques. Emphasizing that all should Create an Action Plan. Written material given at graduation. Flowsheet Row Pulmonary Rehab from 12/17/2021 in [facility] Cardiac and Pulmonary Rehab  Education need identified 09/29/21  Date 11/06/21  Educator [facility]  Instruction Review Code 1-  Verbalizes Understanding       AED/CPR: - Group verbal and written instruction with the use of models to demonstrate the basic use of the AED with the basic ABC's of resuscitation.    Anatomy and Cardiac Procedures: - Group verbal and visual presentation and models provide information about basic cardiac anatomy and function. Reviews the testing methods done to diagnose heart disease and the outcomes of the test results. Describes the treatment choices: Medical Management, Angioplasty, or Coronary Bypass Surgery for treating various heart conditions including Myocardial Infarction, Angina, Valve Disease, and Cardiac Arrhythmias.  Written material given at graduation. Flowsheet Row Cardiac Rehab from 04/01/2016 in [facility] Cardiac and Pulmonary Rehab  Date 02/24/16  Educator TS  Instruction Review Code (retired) 2- meets goals/outcomes       Medication Safety: - Group verbal and visual instruction to review commonly prescribed medications for heart and lung disease. Reviews the medication, class of the drug, and side effects. Includes the steps to properly store meds and maintain the prescription regimen.  Written material given at graduation. Flowsheet Row Cardiac Rehab from 04/01/2016 in [facility] Cardiac and Pulmonary Rehab  Date 03/11/16  Mario Sicard 2]  Educator TS  Instruction Review Code (retired) 2- meets goals/outcomes       Other: -Provides group and verbal instruction on various topics (see comments)   Knowledge Questionnaire Score:  Knowledge Questionnaire Score - 06/02/23 1555       Knowledge Questionnaire Score   Pre Score 25              Core Components/Risk Factors/Patient Goals at Admission:  Personal Goals and Risk Factors at Admission - 05/31/23 0940       Core Components/Risk Factors/Patient Goals on Admission    Weight Management Yes;Weight Maintenance    Intervention Weight Management: Develop a combined nutrition and exercise program designed to reach desired  caloric intake, while maintaining appropriate intake of nutrient and fiber, sodium and fats, and appropriate energy expenditure required for the weight goal.;Weight Management: Provide education and appropriate resources to help participant work on and attain dietary goals.;Weight Management/Obesity: Establish reasonable short term and long term weight goals.    Expected Outcomes Short Term: Continue to assess and modify interventions until short term weight is achieved;Understanding recommendations for meals to include 15-35% energy as protein, 25-35% energy from fat, 35-60% energy from carbohydrates, less than 200mg  of dietary cholesterol, 20-35 gm of total fiber daily;Understanding of distribution of calorie intake throughout the day with the consumption of 4-5 meals/snacks;Long Term: Adherence to nutrition and physical activity/exercise program aimed toward attainment of established weight goal;Weight Maintenance: Understanding of the daily nutrition guidelines, which includes 25-35% calories from fat, 7% or less cal from saturated fats, less than 200mg  cholesterol, less than 1.5gm of  sodium, & 5 or more servings of fruits and vegetables daily    Improve shortness of breath with ADL's Yes    Intervention Provide education, individualized exercise plan and daily activity instruction to help decrease symptoms of SOB with activities of daily living.    Expected Outcomes Short Term: Improve cardiorespiratory fitness to achieve a reduction of symptoms when performing ADLs    Diabetes Yes    Intervention Provide education about signs/symptoms and action to take for hypo/hyperglycemia.;Provide education about proper nutrition, including hydration, and aerobic/resistive exercise prescription along with prescribed medications to achieve blood glucose in normal ranges: Fasting glucose 65-99 mg/dL    Expected Outcomes Short Term: Participant verbalizes understanding of the signs/symptoms and immediate care of  hyper/hypoglycemia, proper foot care and importance of medication, aerobic/resistive exercise and nutrition plan for blood glucose control.;Long Term: Attainment of HbA1C < 7%.    Hypertension Yes    Intervention Provide education on lifestyle modifcations including regular physical activity/exercise, weight management, moderate sodium restriction and increased consumption of fresh fruit, vegetables, and low fat dairy, alcohol moderation, and smoking cessation.;Monitor prescription use compliance.    Expected Outcomes Short Term: Continued assessment and intervention until BP is < 140/31mm HG in hypertensive participants. < 130/20mm HG in hypertensive participants with diabetes, heart failure or chronic kidney disease.;Long Term: Maintenance of blood pressure at goal levels.    Lipids Yes    Intervention Provide education and support for participant on nutrition & aerobic/resistive exercise along with prescribed medications to achieve LDL 70mg , HDL >40mg .    Expected Outcomes Short Term: Participant states understanding of desired cholesterol values and is compliant with medications prescribed. Participant is following exercise prescription and nutrition guidelines.;Long Term: Cholesterol controlled with medications as prescribed, with individualized exercise RX and with personalized nutrition plan. Value goals: LDL < 70mg , HDL > 40 mg.             Education:Diabetes - Individual verbal and written instruction to review signs/symptoms of diabetes, desired ranges of glucose level fasting, after meals and with exercise. Acknowledge that pre and post exercise glucose checks will be done for 3 sessions at entry of program. Flowsheet Row Pulmonary Rehab from 06/16/2023 in [facility] Cardiac and Pulmonary Rehab  Date 06/02/23  Educator [facility]  Instruction Review Code 1- Verbalizes Understanding       Know Your Numbers and Heart Failure: - Group verbal and visual instruction to discuss disease risk factors  for cardiac and pulmonary disease and treatment options.  Reviews associated critical values for Overweight/Obesity, Hypertension, Cholesterol, and Diabetes.  Discusses basics of heart failure: signs/symptoms and treatments.  Introduces Heart Failure Zone chart for action plan for heart failure.  Written material given at graduation. Flowsheet Row Pulmonary Rehab from 12/17/2021 in [facility] Cardiac and Pulmonary Rehab  Date 10/29/21  Educator SB  Instruction Review Code 1- Verbalizes Understanding       Core Components/Risk Factors/Patient Goals Review:   Goals and Risk Factor Review     Row Name 07/14/23 1110 08/23/23 1138           Core Components/Risk Factors/Patient Goals Review   Personal Goals Review Improve shortness of breath with ADL's Weight Management/Obesity      Review Spoke to patient about their shortness of breath and what they can do to improve. Patient has been informed of breathing techniques when starting the program. Patient is informed to tell staff if they have had any med changes and that certain meds they are taking or not taking  can be causing shortness of breath. Kelcy states that he has lost weight and is at his ideal body weight. He like the fact that he feels stronger and has less weight to carry.      Expected Outcomes Short: Attend [facility] regularly to improve shortness of breath with ADL's. Long: maintain independence with ADL's Short: continue maintaining weight. Long: maintain weight independently.               Core Components/Risk Factors/Patient Goals at Discharge (Final Review):   Goals and Risk Factor Review - 08/23/23 1138       Core Components/Risk Factors/Patient Goals Review   Personal Goals Review Weight Management/Obesity    Review Klaus states that he has lost weight and is at his ideal body weight. He like the fact that he feels stronger and has less weight to carry.    Expected Outcomes Short: continue maintaining weight. Long: maintain  weight independently.             ITP Comments:  ITP Comments     Row Name 05/31/23 1610 06/02/23 1544 06/14/23 1622 06/30/23 0950 07/28/23 1046   ITP Comments Virtual Visit completed. Patient informed on EP and RD appointment and 6 Minute walk test. Patient also informed of patient health questionnaires on My Chart. Patient Verbalizes understanding. Visit diagnosis can be found in [facility] media, patient is [place]. Completed and gym orientation. Initial ITP created and sent for review to Dr. Faud Aleskerov, Medical Director. First full day of exercise!  Patient was oriented to gym and equipment including functions, settings, policies, and procedures.  Patient's individual exercise prescription and treatment plan were reviewed.  All starting workloads were established based on the results of the 6 minute walk test done at initial orientation visit.  The plan for exercise progression was also introduced and progression will be customized based on patient's performance and goals. 30 Day review completed. Medical Director ITP review done, changes made as directed, and signed approval by Medical Director.    new to program 30 Day review completed. Medical Director ITP review done, changes made as directed, and signed approval by Medical Director.    Row Name 08/25/23 1045           ITP Comments 30 Day review completed. Medical Director ITP review done, changes made as directed, and signed approval by Medical Director.                Comments:

## 2023-08-27 ENCOUNTER — Encounter

## 2023-08-27 DIAGNOSIS — J849 Interstitial pulmonary disease, unspecified: Secondary | ICD-10-CM

## 2023-08-27 NOTE — Progress Notes (Signed)
 Daily Session Note  Patient Details  Name: Brian Casey MRN: [id number] Date of Birth: 09/03/1950 Referring Provider:   Flowsheet Row Pulmonary Rehab from 06/02/2023 in [facility] Cardiac and Pulmonary Rehab  Referring Provider Dr. Alphonso Aschoff       Encounter Date: 08/27/2023  Check In:  Session Check In - 08/27/23 1100       Check-In   Supervising physician immediately available to respond to emergencies See telemetry face sheet for immediately available ER MD    Location [facility]-Cardiac & Pulmonary Rehab    Staff Present Sherle Dire, BS, Exercise Physiologist;Kyeshia Zinn Sabra Cramp BS, ACSM CEP, Exercise Physiologist;Yurem Viner RN,BSN,MPA;Maxon Conetta BS, Exercise Physiologist    Virtual Visit No    Medication changes reported     No    Fall or balance concerns reported    No    Tobacco Cessation No Change    Warm-up and Cool-down Performed on first and last piece of equipment    Resistance Training Performed Yes    VAD Patient? No    PAD/SET Patient? No      Pain Assessment   Currently in Pain? No/denies                Social History   Tobacco Use  Smoking Status Former   Current packs/day: 0.00   Average packs/day: 1 pack/day for 48.0 years (48.0 ttl pk-yrs)   Types: Cigarettes   Start date: 11/07/1967   Quit date: 11/07/2015   Years since quitting: 7.8  Smokeless Tobacco Never    Goals Met:  Independence with exercise equipment Exercise tolerated well No report of concerns or symptoms today Strength training completed today  Goals Unmet:  Not Applicable  Comments: Pt able to follow exercise prescription today without complaint.  Will continue to monitor for progression.    Dr. Firman Hughes is Medical Director for [facility] Cardiac Rehabilitation.  Dr. Fuad Aleskerov is Medical Director for [facility] Pulmonary Rehabilitation.

## 2023-08-30 ENCOUNTER — Encounter: Admitting: *Deleted

## 2023-08-30 DIAGNOSIS — J849 Interstitial pulmonary disease, unspecified: Secondary | ICD-10-CM

## 2023-08-30 NOTE — Progress Notes (Signed)
 Daily Session Note  Patient Details  Name: Brian Casey MRN: [id number] Date of Birth: [birth year] Referring Provider:   Flowsheet Row Pulmonary Rehab from 06/02/2023 in [facility] Cardiac and Pulmonary Rehab  Referring Provider Dr. Alphonso Aschoff       Encounter Date: 08/30/2023  Check In:  Session Check In - 08/30/23 1117       Check-In   Supervising physician immediately available to respond to emergencies See telemetry face sheet for immediately available ER MD    Location [facility]-Cardiac & Pulmonary Rehab    Staff Present Maud Sorenson, RN, BSN, CCRP;Meredith Manson Seitz RN,BSN;Maxon Sesser BS, Exercise Physiologist;Kelly Bollinger [facility]    Virtual Visit No    Medication changes reported     No    Fall or balance concerns reported    No    Warm-up and Cool-down Performed on first and last piece of equipment    Resistance Training Performed Yes    VAD Patient? No    PAD/SET Patient? No      Pain Assessment   Currently in Pain? No/denies                Social History   Tobacco Use  Smoking Status Former   Current packs/day: 0.00   Average packs/day: 1 pack/day for 48.0 years (48.0 ttl pk-yrs)   Types: Cigarettes   Start date: 11/07/1967   Quit date: 11/07/2015   Years since quitting: 7.8  Smokeless Tobacco Never    Goals Met:  Proper associated with RPD/PD & O2 Sat Independence with exercise equipment Exercise tolerated well No report of concerns or symptoms today  Goals Unmet:  Not Applicable  Comments: Pt able to follow exercise prescription today without complaint.  Will continue to monitor for progression.    Dr. Firman Hughes is Medical Director for [facility] Cardiac Rehabilitation.  Dr. Fuad Aleskerov is Medical Director for [facility] Pulmonary Rehabilitation.

## 2023-09-01 ENCOUNTER — Encounter: Admitting: *Deleted

## 2023-09-01 VITALS — Ht 68.0 in | Wt 157.7 lb

## 2023-09-01 DIAGNOSIS — J849 Interstitial pulmonary disease, unspecified: Secondary | ICD-10-CM | POA: Diagnosis not present

## 2023-09-01 NOTE — Progress Notes (Signed)
 Daily Session Note  Patient Details  Name: Brian Casey MRN: [id number] Date of Birth: [birth year] Referring Provider:   Flowsheet Row Pulmonary Rehab from 06/02/2023 in [facility] Cardiac and Pulmonary Rehab  Referring Provider Dr. Alphonso Aschoff       Encounter Date: 09/01/2023  Check In:  Session Check In - 09/01/23 1112       Check-In   Supervising physician immediately available to respond to emergencies See telemetry face sheet for immediately available ER MD    Location [facility]-Cardiac & Pulmonary Rehab    Staff Present Maud Sorenson, RN, BSN, CCRP;Noah Tickle, BS, Exercise Physiologist;Kelly Bollinger RN,BSN,MPA;Jason Martina Sledge RDN,LDN    Virtual Visit No    Medication changes reported     No    Fall or balance concerns reported    No    Warm-up and Cool-down Performed on first and last piece of equipment    Resistance Training Performed Yes    VAD Patient? No    PAD/SET Patient? No      Pain Assessment   Currently in Pain? No/denies                Social History   Tobacco Use  Smoking Status Former   Current packs/day: 0.00   Average packs/day: 1 pack/day for 48.0 years (48.0 ttl pk-yrs)   Types: Cigarettes   Start date: 11/07/1967   Quit date: 11/07/2015   Years since quitting: 7.8  Smokeless Tobacco Never    Goals Met:  Proper associated with RPD/PD & O2 Sat Independence with exercise equipment Exercise tolerated well No report of concerns or symptoms today  Goals Unmet:  Not Applicable  Comments: Pt able to follow exercise prescription today without complaint.  Will continue to monitor for progression.    Dr. Firman Hughes is Medical Director for [facility] Cardiac Rehabilitation.  Dr. Fuad Aleskerov is Medical Director for [facility] Pulmonary Rehabilitation.

## 2023-09-01 NOTE — Patient Instructions (Signed)
 Discharge Patient Instructions  Patient Details  Name: Brian Casey MRN: [id number] Date of Birth: [birth year] Referring Provider:  Lyle San, MD   Number of Visits: 36  Reason for Discharge:  Patient reached a stable level of exercise. Patient independent in their exercise. Patient has met program and personal goals.  Diagnosis:  ILD (interstitial lung disease) (HCC)  Initial Exercise Prescription:  Initial Exercise Prescription - 06/02/23 1500       Date of Initial Exercise RX and Referring Provider   Date 06/02/23    Referring Provider Dr. Alphonso Aschoff      Oxygen   Oxygen Continuous    Liters 4    Maintain Oxygen Saturation 88% or higher      Recumbant Bike   Level 1    RPM 50    Watts 25    Minutes 15    METs 2.26      NuStep   Level 1    SPM 80    Minutes 15    METs 2.26      Arm Ergometer   Level 1    RPM 30    Minutes 15    METs 2.26      T5 Nustep   Level 1    SPM 80    Minutes 15    METs 2.26      Track   Laps 23    Minutes 15    METs 2.25      Intensity   THRR 40-80% of Max Heartrate 103-133    Ratings of Perceived Exertion 11-13    Perceived Dyspnea 0-4      Resistance Training   Training Prescription Yes    Weight 4 lb    Reps 10-15             Discharge Exercise Prescription (Final Exercise Prescription Changes):  Exercise Prescription Changes - 08/26/23 1400       Response to Exercise   Blood Pressure (Admit) 100/58    Blood Pressure (Exit) 116/64    Heart Rate (Admit) 82 bpm    Heart Rate (Exercise) 95 bpm    Heart Rate (Exit) 82 bpm    Oxygen Saturation (Admit) 94 %    Oxygen Saturation (Exercise) 90 %    Oxygen Saturation (Exit) 98 %    Rating of Perceived Exertion (Exercise) 11    Perceived Dyspnea (Exercise) 1    Symptoms none    Duration Continue with 30 min of aerobic exercise without signs/symptoms of physical distress.    Intensity THRR unchanged      Progression   Progression Continue to  progress workloads to maintain intensity without signs/symptoms of physical distress.    Average METs 2.94      Resistance Training   Training Prescription Yes    Weight 3 lb    Reps 10-15      Interval Training   Interval Training No      Oxygen   Oxygen Continuous    Liters 4      Recumbant Bike   Level 1.4    Watts 25    Minutes 15    METs 3.12      NuStep   Level 3    Minutes 15    METs 3.2      REL-XR   Level 3    Minutes 15    METs 4.1      T5 Nustep   Level 3    Minutes 15  METs 2      Home Exercise Plan   Plans to continue exercise at Home (comment)   walk, look into gym membership, strength and stretching videos.   Frequency Add 2 additional days to program exercise sessions.    Initial Home Exercises Provided 07/19/23      Oxygen   Maintain Oxygen Saturation 88% or higher             Functional Capacity:  6 Minute Walk     Row Name 06/02/23 1540 09/01/23 1141       6 Minute Walk   Phase Initial Discharge    Distance 900 feet 725 feet    Distance % Change -- -19.4 %    Distance Feet Change -- -175 ft    Walk Time 6 minutes 4.92 minutes    # of Rest Breaks 0 1    MPH 1.7 1.68    METS 2.26 1.96    RPE 13 15    Perceived Dyspnea  2 3    VO2 Peak 7.9 6.87    Symptoms No No    Resting HR 73 bpm 67 bpm    Resting BP 104/62 106/66    Resting Oxygen Saturation  91 % 98 %    Exercise Oxygen Saturation  during 6 min walk 86 % 85 %    Max Ex. HR 106 bpm 103 bpm    Max Ex. BP 110/64 114/64    2 Minute Post BP 108/62 120/66      Interval HR   1 Minute HR 80 84    2 Minute HR 90 91    3 Minute HR -- 93    4 Minute HR 64 103    5 Minute HR 71 91    6 Minute HR 106 102    2 Minute Post HR 82 79    Interval Heart Rate? Yes Yes      Interval Oxygen   Interval Oxygen? Yes Yes    Baseline Oxygen Saturation % 91 % 98 %    1 Minute Oxygen Saturation % 93 % 93 %    1 Minute Liters of Oxygen 4 L 4 L    2 Minute Oxygen Saturation % 93 % 91  %    2 Minute Liters of Oxygen 4 L 4 L    3 Minute Oxygen Saturation % 91 % 87 %    3 Minute Liters of Oxygen 4 L 4 L    4 Minute Oxygen Saturation % 89 % 85 %    4 Minute Liters of Oxygen 4 L 4 L    5 Minute Oxygen Saturation % 87 % 88 %    5 Minute Liters of Oxygen 4 L 4 L    6 Minute Oxygen Saturation % 86 % 88 %    6 Minute Liters of Oxygen 4 L 4 L    2 Minute Post Oxygen Saturation % 97 % 87 %    2 Minute Post Liters of Oxygen 4 L 4 L            Nutrition & Weight - Outcomes:  Pre Biometrics - 06/02/23 1550       Pre Biometrics   Height 5' 8 (1.727 m)    Weight 158 lb 6.4 oz (71.8 kg)    Waist Circumference 34 inches    Hip Circumference 36.5 inches    Waist to Hip Ratio 0.93 %    BMI (Calculated) 24.09  Single Leg Stand 5.84 seconds             Post Biometrics - 09/01/23 1144        Post  Biometrics   Height 5' 8 (1.727 m)    Weight 157 lb 11.2 oz (71.5 kg)    Waist Circumference 36.5 inches    Hip Circumference 38 inches    Waist to Hip Ratio 0.96 %    BMI (Calculated) 23.98    Single Leg Stand 3.7 seconds

## 2023-09-03 ENCOUNTER — Encounter: Admitting: *Deleted

## 2023-09-03 DIAGNOSIS — J849 Interstitial pulmonary disease, unspecified: Secondary | ICD-10-CM | POA: Diagnosis not present

## 2023-09-03 NOTE — Progress Notes (Signed)
 Daily Session Note  Patient Details  Name: NATION CRADLE MRN: [id number] Date of Birth: 12-21-1950 Referring Provider:   Flowsheet Row Pulmonary Rehab from 06/02/2023 in [facility] Cardiac and Pulmonary Rehab  Referring Provider Dr. Alphonso Aschoff    Encounter Date: 09/03/2023  Check In:  Session Check In - 09/03/23 1130       Check-In   Supervising physician immediately available to respond to emergencies See telemetry face sheet for immediately available ER MD    Location [facility]-Cardiac & Pulmonary Rehab    Staff Present Maud Sorenson, RN, BSN, CCRP;Meredith Craven RN,BSN;Maxon [employer], Exercise Physiologist;Noah Tickle, BS, Exercise Physiologist    Virtual Visit No    Medication changes reported     No    Fall or balance concerns reported    No    Warm-up and Cool-down Performed on first and last piece of equipment    Resistance Training Performed Yes    VAD Patient? No    PAD/SET Patient? No      Pain Assessment   Currently in Pain? No/denies             Social History   Tobacco Use  Smoking Status Former   Current packs/day: 0.00   Average packs/day: 1 pack/day for 48.0 years (48.0 ttl pk-yrs)   Types: Cigarettes   Start date: 11/07/1967   Quit date: 11/07/2015   Years since quitting: 7.8  Smokeless Tobacco Never    Goals Met:  Proper associated with RPD/PD & O2 Sat Independence with exercise equipment Exercise tolerated well No report of concerns or symptoms today  Goals Unmet:  Not Applicable  Comments: Pt able to follow exercise prescription today without complaint.  Will continue to monitor for progression.    Dr. Firman Hughes is Medical Director for [facility] Cardiac Rehabilitation.  Dr. Fuad Aleskerov is Medical Director for [facility] Pulmonary Rehabilitation.

## 2023-09-06 ENCOUNTER — Encounter

## 2023-09-07 ENCOUNTER — Ambulatory Visit (INDEPENDENT_AMBULATORY_CARE_PROVIDER_SITE_OTHER): Admitting: Dermatology

## 2023-09-07 DIAGNOSIS — Z872 Personal history of diseases of the skin and subcutaneous tissue: Secondary | ICD-10-CM

## 2023-09-07 DIAGNOSIS — L821 Other seborrheic keratosis: Secondary | ICD-10-CM | POA: Diagnosis not present

## 2023-09-07 DIAGNOSIS — Z1283 Encounter for screening for malignant neoplasm of skin: Secondary | ICD-10-CM | POA: Diagnosis not present

## 2023-09-07 DIAGNOSIS — D2361 Other benign neoplasm of skin of right upper limb, including shoulder: Secondary | ICD-10-CM

## 2023-09-07 DIAGNOSIS — W908XXA Exposure to other nonionizing radiation, initial encounter: Secondary | ICD-10-CM

## 2023-09-07 DIAGNOSIS — L578 Other skin changes due to chronic exposure to nonionizing radiation: Secondary | ICD-10-CM

## 2023-09-07 DIAGNOSIS — D692 Other nonthrombocytopenic purpura: Secondary | ICD-10-CM | POA: Diagnosis not present

## 2023-09-07 DIAGNOSIS — L570 Actinic keratosis: Secondary | ICD-10-CM | POA: Diagnosis not present

## 2023-09-07 DIAGNOSIS — D239 Other benign neoplasm of skin, unspecified: Secondary | ICD-10-CM

## 2023-09-07 DIAGNOSIS — L814 Other melanin hyperpigmentation: Secondary | ICD-10-CM | POA: Diagnosis not present

## 2023-09-07 NOTE — Patient Instructions (Addendum)

## 2023-09-07 NOTE — Progress Notes (Signed)
 Follow-Up Visit   Subjective  Brian Casey is a 73 y.o. male who presents for the following: Spots on the scalp, irritated. History of AKs. Pt has interstitial lung disease and is on oxygen.  The patient presents for Upper Body Skin Exam (UBSE) for skin cancer screening and mole check.  The patient has spots, moles and lesions to be evaluated, some may be new or changing and the patient has concerns that these could be cancer.  The following portions of the chart were reviewed this encounter and updated as appropriate: medications, allergies, medical history  Review of Systems:  No other skin or systemic complaints except as noted in HPI or Assessment and Plan.  Objective  Well appearing patient in no apparent distress; mood and affect are within normal limits.  A focused examination was performed of the following areas: Face, scalp, arms, back  Relevant physical exam findings are noted in the Assessment and Plan.  scalp x 4 (4) Pink/tan keratotic macules.   Assessment & Plan   Skin cancer screening performed today.   ACTINIC DAMAGE - chronic, secondary to cumulative UV radiation exposure/sun exposure over time - diffuse scaly erythematous macules with underlying dyspigmentation - Recommend daily broad spectrum sunscreen SPF 30+ to sun-exposed areas, reapply every 2 hours as needed.  - Recommend staying in the shade or wearing long sleeves, sun glasses (UVA+UVB protection) and wide brim hats (4-inch brim around the entire circumference of the hat). - Call for new or changing lesions.  SEBORRHEIC KERATOSIS - Stuck-on, waxy, tan-brown papules - Benign-appearing - Discussed benign etiology and prognosis. - Observe - Call for any changes   LENTIGINES Exam: scattered tan macules Due to sun exposure Treatment Plan: Benign-appearing, observe. Recommend daily broad spectrum sunscreen SPF 30+ to sun-exposed areas, reapply every 2 hours as needed.  Call for any  changes  Purpura - Chronic; persistent and recurrent.  Treatable, but not curable. - Violaceous macules and patches - Benign - Related to trauma, age, sun damage and/or use of blood thinners, chronic use of topical and/or oral steroids - Observe - Can use OTC arnica containing moisturizer such as Dermend Bruise Formula if desired - Call for worsening or other concerns  DERMATOFIBROMA VS CYST Exam: 4 mm firm flesh tan papule at right lower shoulder.  Treatment Plan: A dermatofibroma is a benign growth possibly related to trauma, such as an insect bite, cut from shaving, or inflamed acne-type bump.  Treatment options to remove include shave or excision with resulting scar and risk of recurrence.  Since benign-appearing and not bothersome, will observe for now.   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  Actinic keratoses are precancerous spots that appear secondary to cumulative UV radiation exposure/sun exposure over time. They are chronic with expected duration over 1 year. A portion of actinic keratoses will progress to squamous cell carcinoma of the skin. It is not possible to reliably predict which spots will progress to skin cancer and so treatment is recommended to prevent development of skin cancer.  Recommend daily broad spectrum sunscreen SPF 30+ to sun-exposed areas, reapply every 2 hours as needed.  Recommend staying in the shade or wearing long sleeves, sun glasses (UVA+UVB protection) and wide brim hats (4-inch brim around the entire circumference of the hat). Call for new or changing lesions. Destruction of lesion - scalp x 4 (4)  Destruction method: cryotherapy   Informed consent: discussed and consent obtained   Lesion destroyed using liquid nitrogen: Yes  Region frozen until ice ball extended beyond lesion: Yes   Outcome: patient tolerated procedure well with no complications   Post-procedure details: wound care instructions given   Additional  details:  Prior to procedure, discussed risks of blister formation, small wound, skin dyspigmentation, or rare scar following cryotherapy. Recommend Vaseline ointment to treated areas while healing.    Return if symptoms worsen or fail to improve or 1 year UBSE.  IBernardine Bridegroom, CMA, am acting as scribe for Artemio Kailer, MD .   Documentation: I have reviewed the above documentation for accuracy and completeness, and I agree with the above.  Artemio Stalin, MD

## 2023-09-08 ENCOUNTER — Encounter: Admitting: *Deleted

## 2023-09-08 DIAGNOSIS — J849 Interstitial pulmonary disease, unspecified: Secondary | ICD-10-CM | POA: Diagnosis not present

## 2023-09-08 NOTE — Progress Notes (Signed)
 Daily Session Note  Patient Details  Name: Brian Casey MRN: [id number] Date of Birth: Sep 09, 1950 Referring Provider:   Flowsheet Row Pulmonary Rehab from 06/02/2023 in [name] Cardiac and Pulmonary Rehab  Referring Provider Dr. Alphonso Aschoff    Encounter Date: 09/08/2023  Check In:  Session Check In - 09/08/23 1129       Check-In   Supervising physician immediately available to respond to emergencies See telemetry face sheet for immediately available ER MD    Location [facility]-Cardiac & Pulmonary Rehab    Staff Present Maud Sorenson, RN, BSN, CCRP;Joseph Hood RCP,RRT,BSRT;Maxon Lincoln BS, Exercise Physiologist;Jason Martina Sledge RDN,LDN    Virtual Visit No    Medication changes reported     No    Fall or balance concerns reported    No    Warm-up and Cool-down Performed on first and last piece of equipment    Resistance Training Performed Yes    VAD Patient? No    PAD/SET Patient? No      Pain Assessment   Currently in Pain? No/denies             Social History   Tobacco Use  Smoking Status Former   Current packs/day: 0.00   Average packs/day: 1 pack/day for 48.0 years (48.0 ttl pk-yrs)   Types: Cigarettes   Start date: 11/07/1967   Quit date: 11/07/2015   Years since quitting: 7.8  Smokeless Tobacco Never    Goals Met:  Proper associated with RPD/PD & O2 Sat Independence with exercise equipment Exercise tolerated well No report of concerns or symptoms today  Goals Unmet:  Not Applicable  Comments: Pt able to follow exercise prescription today without complaint.  Will continue to monitor for progression.    Dr. Firman Hughes is Medical Director for [facility] Cardiac Rehabilitation.  Dr. Fuad Aleskerov is Medical Director for [facility] Pulmonary Rehabilitation.

## 2023-09-10 ENCOUNTER — Encounter: Admitting: *Deleted

## 2023-09-10 DIAGNOSIS — J849 Interstitial pulmonary disease, unspecified: Secondary | ICD-10-CM

## 2023-09-10 NOTE — Progress Notes (Signed)
 Pulmonary Individual Treatment Plan  Patient Details  Name: ZAID TOMES MRN: [id number] Date of Birth: 1950-10-20 Referring Provider:   Flowsheet Row Pulmonary Rehab from 06/02/2023 in [facility] Cardiac and Pulmonary Rehab  Referring Provider Dr. Alphonso Aschoff    Initial Encounter Date:  Flowsheet Row Pulmonary Rehab from 06/02/2023 in [facility] Cardiac and Pulmonary Rehab  Date 06/02/23    Visit Diagnosis: ILD (interstitial lung disease) (HCC)  Patient's Home Medications on Admission:  Current Outpatient Medications:    aspirin  EC 81 MG tablet, Take 81 mg by mouth daily. Swallow whole., Disp: , Rfl:    atorvastatin  (LIPITOR) 40 MG tablet, Take 1 tablet (40 mg total) by mouth daily., Disp: 90 tablet, Rfl: 3   carvedilol  (COREG ) 12.5 MG tablet, Take 1 tablet (12.5 mg total) by mouth 2 (two) times daily with a meal., Disp: 180 tablet, Rfl: 3   empagliflozin (JARDIANCE) 10 MG TABS tablet, Take 25 mg by mouth daily., Disp: , Rfl:    ezetimibe  (ZETIA ) 10 MG tablet, Take 1 tablet (10 mg total) by mouth daily., Disp: 90 tablet, Rfl: 3   furosemide  (LASIX ) 20 MG tablet, Take 1 tablet (20 mg total) by mouth daily., Disp: 90 tablet, Rfl: 3   glucose blood test strip, Use to test blood sugar twice a day, Disp: 200 each, Rfl: 3   levothyroxine  (SYNTHROID ) 100 MCG tablet, Take 1 tablet (100 mcg total) by mouth, on an empty stomach, daily with a glass of water at least 30-60 minutes before breakfast., Disp: 90 tablet, Rfl: 3   levothyroxine  (SYNTHROID ) 100 MCG tablet, Take 1 tablet (100 mcg total) by mouth once daily on an empty stomach with a glass o water at least 30-60 minutes before breakfast, Disp: 90 tablet, Rfl: 3   loperamide (IMODIUM) 2 MG capsule, Take 2 mg by mouth as needed for diarrhea or loose stools. (Patient not taking: Reported on 05/31/2023), Disp: , Rfl:    NEOMYCIN -POLYMYXIN-HYDROCORTISONE (CORTISPORIN) 1 % SOLN OTIC solution, Apply 1-2 drops to toe BID after soaking, Disp: 10 mL, Rfl: 1    omeprazole  (PRILOSEC) 20 MG capsule, Take 1 capsule (20 mg total) by mouth daily., Disp: 90 capsule, Rfl: 3   ondansetron  (ZOFRAN ) 4 MG tablet, Take 1 tablet (4 mg total) by mouth every 8 (eight) hours as needed for nausea and/or vomitting. (Patient not taking: Reported on 05/31/2023), Disp: 20 tablet, Rfl: 3   OXYGEN, Inhale 4 L into the lungs daily., Disp: , Rfl:    Pirfenidone  801 MG TABS, Take 1 tablet (801 mg total) by mouth in the morning and at bedtime. **low dose as maintenance**, Disp: 180 tablet, Rfl: 1   PRECISION QID TEST test strip, Use 2 (two) times daily, Disp: , Rfl:    predniSONE  (DELTASONE ) 10 MG tablet, Take 1 tablet (10 mg total) by mouth daily with breakfast., Disp: 90 tablet, Rfl: 1   tadalafil  (CIALIS ) 10 MG tablet, Take 1 tablet (10 mg total) by mouth daily as needed for erectile dysfunction., Disp: 90 tablet, Rfl: 3  Past Medical History: Past Medical History:  Diagnosis Date   Adenomatous colon polyp    Aortic root dilatation (HCC)    Noted on echo in August 2023.  Measured 39 mm.   Arthritis    lower spine; fingers (11/07/2015)   CAD (coronary artery disease)    a. 10/2015 NSTEMI/Cath: LM 40, LAD 70p, LCX 90ost, RI 90ost, RCA 60-70p, 90-68m/d, 40-50d; b. 10/2015 CABG x 4: LIMA->LAD, VG->Diag, VG->RI, VG->RPDA.   Chronic  respiratory failure with hypoxia (HCC)    CKD (chronic kidney disease), stage III (HCC)    Diastolic dysfunction    a. 10/2021 Echo: EF 60-65%,   Diverticulosis    GERD (gastroesophageal reflux disease)    Grade I diastolic dysfunction    High cholesterol    History of bronchitis    Hypertension 10/17/2013   Hypothyroidism    ILD (interstitial lung disease) (HCC)    Mild pulmonary hypertension (HCC)    PAD (peripheral artery disease) (HCC)    s/p left carotid endarterectomy   Post-op atrial fibrillation (HCC) 11/06/2015   Shortness of breath    Type II diabetes mellitus (HCC)    type II   Wears glasses     Tobacco Use: Social History    Tobacco Use  Smoking Status Former   Current packs/day: 0.00   Average packs/day: 1 pack/day for 48.0 years (48.0 ttl pk-yrs)   Types: Cigarettes   Start date: 11/07/1967   Quit date: 11/07/2015   Years since quitting: 7.8  Smokeless Tobacco Never    Labs: Review Flowsheet  More data exists      Latest Ref Rng & Units 11/14/2015 11/15/2015 01/15/2016 02/03/2022 05/25/2023  Labs for ITP Cardiac and Pulmonary Rehab  Cholestrol 100 - 199 mg/dL - - - - 161   LDL (calc) 0 - 99 mg/dL - - - - 88   HDL-C >09 mg/dL - - - - 59   Trlycerides 0 - 149 mg/dL - - - - 604   Hemoglobin A1c 4.8 - 5.6 % - - 5.5  - -  PH, Arterial 7.350 - 7.450 7.361  7.308  7.366  7.421  7.352  7.361  - - -  PCO2 arterial 35.0 - 45.0 mmHg 40.3  52.3  38.2  39.5  38.4  39.3  - - -  Bicarbonate 20.0 - 28.0 mmol/L 23.0  26.3  21.9  25.7  21.4  22.5  - 26.2  -  TCO2 22 - 32 mmol/L 24  23  28  23  22  24  24  27  26  26  25  28  25  22  23  24   - 27  -  Acid-base deficit 0.0 - 2.0 mmol/L 2.0  3.0  4.0  3.0  - - -  O2 Saturation % 97.0  100.0  99.0  100.0  61.7  95.0  98.0  - 72  -    Details       Multiple values from one day are sorted in reverse-chronological order          Pulmonary Assessment Scores:  Pulmonary Assessment Scores     Row Name 06/02/23 1554 09/03/23 1131       ADL UCSD   ADL Phase -- Exit    SOB Score total -- 80    Rest -- 1    Walk -- 3    Stairs -- 4    Bath -- 3    Dress -- 3    Shop -- 4      CAT Score   CAT Score -- 25      mMRC Score   mMRC Score 1 --       UCSD: Self-administered rating of dyspnea associated with activities of daily living (ADLs) 6-point scale (0 = not at all to 5 = maximal or unable to do because of breathlessness)  Scoring Scores range from 0 to 120.  Minimally important difference is  5 units  CAT: CAT can identify the health impairment of COPD patients and is better correlated with disease progression.  CAT has a scoring range of zero to  40. The CAT score is classified into four groups of low (less than 10), medium (10 - 20), high (21-30) and very high (31-40) based on the impact level of disease on health status. A CAT score over 10 suggests significant symptoms.  A worsening CAT score could be explained by an exacerbation, poor medication adherence, poor inhaler technique, or progression of COPD or comorbid conditions.  CAT MCID is 2 points  mMRC: mMRC (Modified Medical Research Council) Dyspnea Scale is used to assess the degree of baseline functional disability in patients of respiratory disease due to dyspnea. No minimal important difference is established. A decrease in score of 1 point or greater is considered a positive change.   Pulmonary Function Assessment:  Pulmonary Function Assessment - 05/31/23 0940       Breath   Shortness of Breath Yes;Fear of Shortness of Breath;Limiting activity          Exercise Target Goals: Exercise Program Goal: Individual exercise prescription set using results from initial 6 min walk test and THRR while considering  patient's activity barriers and safety.   Exercise Prescription Goal: Initial exercise prescription builds to 30-45 minutes a day of aerobic activity, 2-3 days per week.  Home exercise guidelines will be given to patient during program as part of exercise prescription that the participant will acknowledge.  Education: Aerobic Exercise: - Group verbal and visual presentation on the components of exercise prescription. Introduces F.I.T.T principle from ACSM for exercise prescriptions.  Reviews F.I.T.T. principles of aerobic exercise including progression. Written material given at graduation. Flowsheet Row Cardiac Rehab from 04/01/2016 in [facility] Cardiac and Pulmonary Rehab  Date 02/17/16  Educator [facility]  Instruction Review Code (retired) 2- meets goals/outcomes    Education: Resistance Exercise: - Group verbal and visual presentation on the components of exercise  prescription. Introduces F.I.T.T principle from ACSM for exercise prescriptions  Reviews F.I.T.T. principles of resistance exercise including progression. Written material given at graduation. Flowsheet Row Pulmonary Rehab from 12/17/2021 in [facility] Cardiac and Pulmonary Rehab  Date 12/03/21  Educator NT  Instruction Review Code 1- [employer] Understanding     Education: Exercise & Equipment Safety: - Individual verbal instruction and demonstration of equipment use and safety with use of the equipment. Flowsheet Row Pulmonary Rehab from 06/16/2023 in [facility] Cardiac and Pulmonary Rehab  Date 06/02/23  Educator [facility]  Instruction Review Code 1- Verbalizes Understanding    Education: Exercise Physiology & General Exercise Guidelines: - Group verbal and written instruction with models to review the exercise physiology of the cardiovascular system and associated critical values. Provides general exercise guidelines with specific guidelines to those with heart or lung disease.    Education: Flexibility, Balance, Mind/Body Relaxation: - Group verbal and visual presentation with interactive activity on the components of exercise prescription. Introduces F.I.T.T principle from ACSM for exercise prescriptions. Reviews F.I.T.T. principles of flexibility and balance exercise training including progression. Also discusses the mind body connection.  Reviews various relaxation techniques to help reduce and manage stress (i.e. Deep breathing, progressive muscle relaxation, and visualization). Balance handout provided to take home. Written material given at graduation. Flowsheet Row Pulmonary Rehab from 12/17/2021 in [facility] Cardiac and Pulmonary Rehab  Date 12/03/21  Educator NT  Instruction Review Code 1- Verbalizes Understanding    Activity Barriers & Risk Stratification:  Activity Barriers & Cardiac Risk  Stratification - 06/02/23 1544       Activity Barriers & Cardiac Risk Stratification   Activity Barriers  Deconditioning;Shortness of Breath;Balance Concerns          6 Minute Walk:  6 Minute Walk     Row Name 06/02/23 1540 09/01/23 1141       6 Minute Walk   Phase Initial Discharge    Distance 900 feet 725 feet    Distance % Change -- -19.4 %    Distance Feet Change -- -175 ft    Walk Time 6 minutes 4.92 minutes    # of Rest Breaks 0 1    MPH 1.7 1.68    METS 2.26 1.96    RPE 13 15    Perceived Dyspnea  2 3    VO2 Peak 7.9 6.87    Symptoms No No    Resting HR 73 bpm 67 bpm    Resting BP 104/62 106/66    Resting Oxygen Saturation  91 % 98 %    Exercise Oxygen Saturation  during 6 min walk 86 % 85 %    Max Ex. HR 106 bpm 103 bpm    Max Ex. BP 110/64 114/64    2 Minute Post BP 108/62 120/66      Interval HR   1 Minute HR 80 84    2 Minute HR 90 91    3 Minute HR -- 93    4 Minute HR 64 103    5 Minute HR 71 91    6 Minute HR 106 102    2 Minute Post HR 82 79    Interval Heart Rate? Yes Yes      Interval Oxygen   Interval Oxygen? Yes Yes    Baseline Oxygen Saturation % 91 % 98 %    1 Minute Oxygen Saturation % 93 % 93 %    1 Minute Liters of Oxygen 4 L 4 L    2 Minute Oxygen Saturation % 93 % 91 %    2 Minute Liters of Oxygen 4 L 4 L    3 Minute Oxygen Saturation % 91 % 87 %    3 Minute Liters of Oxygen 4 L 4 L    4 Minute Oxygen Saturation % 89 % 85 %    4 Minute Liters of Oxygen 4 L 4 L    5 Minute Oxygen Saturation % 87 % 88 %    5 Minute Liters of Oxygen 4 L 4 L    6 Minute Oxygen Saturation % 86 % 88 %    6 Minute Liters of Oxygen 4 L 4 L    2 Minute Post Oxygen Saturation % 97 % 87 %    2 Minute Post Liters of Oxygen 4 L 4 L      Oxygen Initial Assessment:  Oxygen Initial Assessment - 06/14/23 1623       Home Oxygen   Home Oxygen Device Home Concentrator;E-Tanks    Sleep Oxygen Prescription Continuous    Liters per minute 2    Home Exercise Oxygen Prescription Continuous    Liters per minute 4    Home Resting Oxygen Prescription None     Compliance with Home Oxygen Use Yes      Intervention   Short Term Goals --          Oxygen Re-Evaluation:  Oxygen Re-Evaluation     Row Name 06/14/23 1623 07/14/23 1107 08/23/23 1138  Program Oxygen Prescription   Program Oxygen Prescription -- Continuous;E-Tanks Continuous;E-Tanks     Liters per minute -- 4 4     Comments -- -- On oxygen for exercise.       Home Oxygen   Home Oxygen Device -- Home Concentrator;E-Tanks Home Concentrator;E-Tanks     Sleep Oxygen Prescription -- Continuous Continuous     Liters per minute -- 2 2     Home Exercise Oxygen Prescription -- -- Continuous     Liters per minute -- 4 4     Home Resting Oxygen Prescription -- None None     Compliance with Home Oxygen Use -- Yes Yes       Goals/Expected Outcomes   Short Term Goals -- To learn and understand importance of maintaining oxygen saturations>88%;To learn and understand importance of monitoring SPO2 with pulse oximeter and demonstrate accurate use of the pulse oximeter. To learn and demonstrate proper pursed lip breathing techniques or other breathing techniques.      Long  Term Goals Exhibits proper breathing techniques, such as pursed lip breathing or other method taught during program session Verbalizes importance of monitoring SPO2 with pulse oximeter and return demonstration;Maintenance of O2 saturations>88% Exhibits proper breathing techniques, such as pursed lip breathing or other method taught during program session     Comments Reviewed PLB technique with pt.  Talked about how it works and it's importance in maintaining their exercise saturations. He has a pulse oximeter to check his oxygen saturation at home. Informed and explained why it is important to have one. Reviewed that oxygen saturations should be 88 percent and above. Patient verbalizes understanding. Diaphragmatic and PLB breathing explained and performed with patient. Patient has a better understanding of how to do these  exercises to help with breathing performance and relaxation. Patient performed breathing techniques adequately and to practice further at home.     Goals/Expected Outcomes Short: Become more profiecient at using PLB. Long: Become independent at using PLB. Short: monitor oxygen at home with exertion. Long: maintain oxygen saturations above 88 percent independently. Short: practice PLB and diaphragmatic breathing at home. Long: Use PLB and diaphragmatic breathing independently post [employer].        Oxygen Discharge (Final Oxygen Re-Evaluation):  Oxygen Re-Evaluation - 08/23/23 1138       Program Oxygen Prescription   Program Oxygen Prescription Continuous;E-Tanks    Liters per minute 4    Comments On oxygen for exercise.      Home Oxygen   Home Oxygen Device Home Concentrator;E-Tanks    Sleep Oxygen Prescription Continuous    Liters per minute 2    Home Exercise Oxygen Prescription Continuous    Liters per minute 4    Home Resting Oxygen Prescription None    Compliance with Home Oxygen Use Yes      Goals/Expected Outcomes   Short Term Goals To learn and demonstrate proper pursed lip breathing techniques or other breathing techniques.     Long  Term Goals Exhibits proper breathing techniques, such as pursed lip breathing or other method taught during program session    Comments Diaphragmatic and PLB breathing explained and performed with patient. Patient has a better understanding of how to do these exercises to help with breathing performance and relaxation. Patient performed breathing techniques adequately and to practice further at home.    Goals/Expected Outcomes Short: practice PLB and diaphragmatic breathing at home. Long: Use PLB and diaphragmatic breathing independently post [employer].  Initial Exercise Prescription:  Initial Exercise Prescription - 06/02/23 1500       Date of Initial Exercise RX and Referring Provider   Date 06/02/23    Referring Provider Dr.  Alphonso Aschoff      Oxygen   Oxygen Continuous    Liters 4    Maintain Oxygen Saturation 88% or higher      Recumbant Bike   Level 1    RPM 50    Watts 25    Minutes 15    METs 2.26      NuStep   Level 1    SPM 80    Minutes 15    METs 2.26      Arm Ergometer   Level 1    RPM 30    Minutes 15    METs 2.26      T5 Nustep   Level 1    SPM 80    Minutes 15    METs 2.26      Track   Laps 23    Minutes 15    METs 2.25      Intensity   THRR 40-80% of Max Heartrate 103-133    Ratings of Perceived Exertion 11-13    Perceived Dyspnea 0-4      Resistance Training   Training Prescription Yes    Weight 4 lb    Reps 10-15          Perform Capillary Blood Glucose checks as needed.  Exercise Prescription Changes:   Exercise Prescription Changes     Row Name 06/02/23 1500 06/30/23 1500 07/15/23 1500 07/19/23 1100 07/27/23 1400     Response to Exercise   Blood Pressure (Admit) 104/62 94/54 104/62 -- 104/58   Blood Pressure (Exercise) 110/64 138/70 -- -- --   Blood Pressure (Exit) 108/62 96/50 128/66 -- 104/58   Heart Rate (Admit) 73 bpm 70 bpm 67 bpm -- 74 bpm   Heart Rate (Exercise) 106 bpm 98 bpm 98 bpm -- 89 bpm   Heart Rate (Exit) 82 bpm 84 bpm 82 bpm -- 82 bpm   Oxygen Saturation (Admit) 91 % 98 % 96 % -- 98 %   Oxygen Saturation (Exercise) 86 % 92 % 90 % -- 91 %   Oxygen Saturation (Exit) 97 % 97 % 96 % -- 97 %   Rating of Perceived Exertion (Exercise) 13 15 12  -- 12   Perceived Dyspnea (Exercise) 2 3 1  -- 1   Symptoms none none none -- none   Comments results first 2weeks of exercise -- -- --   Duration -- Progress to 30 minutes of  aerobic without signs/symptoms of physical distress Continue with 30 min of aerobic exercise without signs/symptoms of physical distress. Continue with 30 min of aerobic exercise without signs/symptoms of physical distress. Continue with 30 min of aerobic exercise without signs/symptoms of physical distress.   Intensity  -- THRR unchanged THRR unchanged THRR unchanged THRR unchanged     Progression   Progression -- Continue to progress workloads to maintain intensity without signs/symptoms of physical distress. Continue to progress workloads to maintain intensity without signs/symptoms of physical distress. Continue to progress workloads to maintain intensity without signs/symptoms of physical distress. Continue to progress workloads to maintain intensity without signs/symptoms of physical distress.   Average METs 2.26 2.45 2.83 2.83 2.4     Resistance Training   Training Prescription -- Yes Yes Yes Yes   Weight -- 4lb 3 lb 3 lb 3  lb   Reps -- 10-15 10-15 10-15 10-15     Interval Training   Interval Training No No No No No     Oxygen   Oxygen -- Continuous Continuous Continuous Continuous   Liters -- 4 4 4 4      Recumbant Bike   Level -- 2 2 2 2    Watts -- 25 25 25 18    Minutes -- 15 10 10 15    METs -- 3.1 3.07 3.07 --     NuStep   Level -- 3 2 2 3    Minutes -- 15 15 15 15    METs -- 2.9 3 3  --     REL-XR   Level -- -- 2 2 4    Minutes -- -- 15 15 15    METs -- -- 3.7 3.7 2.8     T5 Nustep   Level -- 2 2 2 3    Minutes -- 15 15 15 15    METs -- 1.8 2 2  1.9     Biostep-RELP   Level -- 2 -- -- --   Minutes -- 15 -- -- --   METs -- 3 -- -- --     Track   Laps -- 9 -- -- --   Minutes -- 15 -- -- --   METs -- 1.49 -- -- --     Home Exercise Plan   Plans to continue exercise at -- -- -- Home (comment)  walk, look into gym membership, strength and stretching videos. Home (comment)  walk, look into gym membership, strength and stretching videos.   Frequency -- -- -- Add 2 additional days to program exercise sessions. Add 2 additional days to program exercise sessions.   Initial Home Exercises Provided -- -- -- 07/19/23 07/19/23     Oxygen   Maintain Oxygen Saturation -- 88% or higher 88% or higher 88% or higher 88% or higher    Row Name 08/12/23 1400 08/26/23 1400 09/08/23 1600          Response to Exercise   Blood Pressure (Admit) 102/60 100/58 124/64     Blood Pressure (Exit) 100/54 116/64 112/60     Heart Rate (Admit) 75 bpm 82 bpm 82 bpm     Heart Rate (Exercise) 87 bpm 95 bpm 89 bpm     Heart Rate (Exit) 78 bpm 82 bpm 75 bpm     Oxygen Saturation (Admit) 97 % 94 % 97 %     Oxygen Saturation (Exercise) 91 % 90 % 89 %     Oxygen Saturation (Exit) 97 % 98 % 98 %     Rating of Perceived Exertion (Exercise) 12 11 12      Perceived Dyspnea (Exercise) 0 1 1     Symptoms none none none     Duration Continue with 30 min of aerobic exercise without signs/symptoms of physical distress. Continue with 30 min of aerobic exercise without signs/symptoms of physical distress. Continue with 30 min of aerobic exercise without signs/symptoms of physical distress.     Intensity THRR unchanged THRR unchanged THRR unchanged       Progression   Progression Continue to progress workloads to maintain intensity without signs/symptoms of physical distress. Continue to progress workloads to maintain intensity without signs/symptoms of physical distress. Continue to progress workloads to maintain intensity without signs/symptoms of physical distress.     Average METs 2.72 2.94 2.9       Resistance Training   Training Prescription Yes Yes Yes  Weight 3 lb 3 lb 3 lb     Reps 10-15 10-15 10-15       Interval Training   Interval Training No No No       Oxygen   Oxygen Continuous Continuous Continuous     Liters 4 4 4        Recumbant Bike   Level -- 1.4 1.4     Watts -- 25 25     Minutes -- 15 15     METs -- 3.12 3.1       NuStep   Level 2 3 3      Minutes 15 15 15      METs 3 3.2 3       REL-XR   Level 3 3 3      Minutes 15 15 15      METs 3.9 4.1 4.1       T5 Nustep   Level 3 3 3      Minutes 15 15 15      METs 2 2 2        Home Exercise Plan   Plans to continue exercise at Home (comment)  walk, look into gym membership, strength and stretching videos. Home (comment)  walk, look  into gym membership, strength and stretching videos. Home (comment)  walk, look into gym membership, strength and stretching videos.     Frequency Add 2 additional days to program exercise sessions. Add 2 additional days to program exercise sessions. Add 2 additional days to program exercise sessions.     Initial Home Exercises Provided 07/19/23 07/19/23 07/19/23       Oxygen   Maintain Oxygen Saturation 88% or higher 88% or higher 88% or higher        Exercise Comments:   Exercise Comments     Row Name 06/14/23 1622 09/10/23 1124         Exercise Comments First full day of exercise!  Patient was oriented to gym and equipment including functions, settings, policies, and procedures.  Patient's individual exercise prescription and treatment plan were reviewed.  All starting workloads were established based on the results of the 6 minute walk test done at initial orientation visit.  The plan for exercise progression was also introduced and progression will be customized based on patient's performance and goals. Duvid graduated today from  rehab with 34 sessions completed.  Details of the patient's exercise prescription and what He needs to do in order to continue the prescription and progress were discussed with patient.  Patient was given a copy of prescription and goals.  Patient verbalized understanding. Eulon plans to continue to exercise by walking at home.         Exercise Goals and Review:   Exercise Goals     Row Name 06/02/23 1550             Exercise Goals   Increase Physical Activity Yes       Intervention Provide advice, education, support and counseling about physical activity/exercise needs.;Develop an individualized exercise prescription for aerobic and resistive training based on initial evaluation findings, risk stratification, comorbidities and participant's personal goals.       Expected Outcomes Short Term: Attend rehab on a regular basis to increase amount of  physical activity.;Long Term: Add in home exercise to make exercise part of routine and to increase amount of physical activity.;Long Term: Exercising regularly at least 3-5 days a week.       Increase Strength and Stamina Yes  Intervention Provide advice, education, support and counseling about physical activity/exercise needs.;Develop an individualized exercise prescription for aerobic and resistive training based on initial evaluation findings, risk stratification, comorbidities and participant's personal goals.       Expected Outcomes Short Term: Increase workloads from initial exercise prescription for resistance, speed, and METs.;Short Term: Perform resistance training exercises routinely during rehab and add in resistance training at home;Long Term: Improve cardiorespiratory fitness, muscular endurance and strength as measured by increased METs and functional capacity ( )       Able to understand and use rate of perceived exertion (RPE) scale Yes       Intervention Provide education and explanation on how to use RPE scale       Expected Outcomes Short Term: Able to use RPE daily in rehab to express subjective intensity level       Able to understand and use Dyspnea scale Yes       Intervention Provide education and explanation on how to use Dyspnea scale       Expected Outcomes Short Term: Able to use Dyspnea scale daily in rehab to express subjective sense of shortness of breath during exertion;Long Term: Able to use Dyspnea scale to guide intensity level when exercising independently       Knowledge and understanding of Target Heart Rate Range (THRR) Yes       Intervention Provide education and explanation of THRR including how the numbers were predicted and where they are located for reference       Expected Outcomes Short Term: Able to state/look up THRR;Short Term: Able to use daily as guideline for intensity in rehab;Long Term: Able to use THRR to govern intensity when exercising  independently       Able to check pulse independently Yes       Intervention Provide education and demonstration on how to check pulse in carotid and radial arteries.;Review the importance of being able to check your own pulse for safety during independent exercise       Expected Outcomes Long Term: Able to check pulse independently and accurately;Short Term: Able to explain why pulse checking is important during independent exercise       Understanding of Exercise Prescription Yes       Intervention Provide education, explanation, and written materials on patient's individual exercise prescription       Expected Outcomes Short Term: Able to explain program exercise prescription;Long Term: Able to explain home exercise prescription to exercise independently          Exercise Goals Re-Evaluation :  Exercise Goals Re-Evaluation     Row Name 06/14/23 1622 06/30/23 1550 07/15/23 1536 07/19/23 1135 07/27/23 1417     Exercise Goal Re-Evaluation   Exercise Goals Review -- Increase Physical Activity;Increase Strength and Stamina;Understanding of Exercise Prescription Increase Physical Activity;Increase Strength and Stamina;Understanding of Exercise Prescription Increase Physical Activity;Increase Strength and Stamina;Able to understand and use rate of perceived exertion (RPE) scale;Able to understand and use Dyspnea scale;Knowledge and understanding of Target Heart Rate Range (THRR);Able to check pulse independently;Understanding of Exercise Prescription Increase Physical Activity;Understanding of Exercise Prescription;Increase Strength and Stamina   Comments Reviewed RPE and dyspnea scale, THR and program prescription with pt today.  Pt voiced understanding and was given a copy of goals to take home. Regan is off to a good start in the program. He was able to attend his first 6 sessions during this review period. During those few sessions he was able to increase both the T5  nustep, and recumbent bike to  level 2. He was also able to increase to level 3 on the T4 nustep. We will continue to monitor his progress in the program. [place] is doing well in rehab. He began using the XR at level 2 and continued at level 2 on all other seated machines. He also continues to do well with keeping his O2 saturations above 88% during exercise. We will continue to monitor his progress in the program. Reviewed home exercise with pt today from 11:15am to 11:25am.  Pt plans to walk, look into a gym membership, and do strength and stretching videos at home for exercise.  Reviewed THR, pulse, RPE, sign and symptoms, pulse oximetery and when to call 911 or MD.  Also discussed weather considerations and indoor options.  Pt voiced understanding. Kendel continues to do well in rehab. He increased to level 3 on the T4 and T5 nusteps. He also increased to level 4 on the XR. We will continue to monitor his progress in the program.   Expected Outcomes Short: Use RPE daily to regulate intensity. Long: Follow program prescription in THR. Short: Continue to follow exercise prescription. Long: Continue exercise to improve strength and stamina. Short: Continue to progressively increase workloads. Long: Continue exercise to improve strength and stamina. Short: add 1-2 days a week of exercise at home on off days of pulmonary rehab. Long: maintain independent exercise routine upon graduation from pulmonary rehab. Short: Continue to progressively increase nustep and XR workloads. Long: Continue exercise to improve strength and stamina.    Row Name 08/12/23 1434 08/26/23 1416 09/08/23 1615         Exercise Goal Re-Evaluation   Exercise Goals Review Increase Physical Activity;Understanding of Exercise Prescription;Increase Strength and Stamina Increase Physical Activity;Understanding of Exercise Prescription;Increase Strength and Stamina Increase Physical Activity;Understanding of Exercise Prescription;Increase Strength and Stamina     Comments  Bradrick is doing well in the program. He was able to maintain level 3 on both the T5 nustep and the XR. He was also able to use the T4 nustep at level 2. We will continue to monitor his progress in the program. Blake is doing well in rehab. He has recently been able to increase from level 2 to 3 on the T4 nustep. He was also able to maintain his level on the XR and T5 nustep at level 3. We will continue to monitor his progress in the program. Richardson continues to do well in rehab. He recently completed his post-6MWT and decreased by 175 feet. He was however able to maintain his workloads on the XR and recumbent bike. We will continue to monitor his progress in the program.     Expected Outcomes Short: Continue to progressively increase nustep and XR workloads. Long: Continue exercise to improve strength and stamina. Short: Continue to increase T4 nustep workloads, improve on . Long: Continue exercise to improve strength and stamina. Short: Graduate. Long: Continue exercise to improve strength and stamina.        Discharge Exercise Prescription (Final Exercise Prescription Changes):  Exercise Prescription Changes - 09/08/23 1600       Response to Exercise   Blood Pressure (Admit) 124/64    Blood Pressure (Exit) 112/60    Heart Rate (Admit) 82 bpm    Heart Rate (Exercise) 89 bpm    Heart Rate (Exit) 75 bpm    Oxygen Saturation (Admit) 97 %    Oxygen Saturation (Exercise) 89 %    Oxygen Saturation (Exit) 98 %  Rating of Perceived Exertion (Exercise) 12    Perceived Dyspnea (Exercise) 1    Symptoms none    Duration Continue with 30 min of aerobic exercise without signs/symptoms of physical distress.    Intensity THRR unchanged      Progression   Progression Continue to progress workloads to maintain intensity without signs/symptoms of physical distress.    Average METs 2.9      Resistance Training   Training Prescription Yes    Weight 3 lb    Reps 10-15      Interval Training    Interval Training No      Oxygen   Oxygen Continuous    Liters 4      Recumbant Bike   Level 1.4    Watts 25    Minutes 15    METs 3.1      NuStep   Level 3    Minutes 15    METs 3      REL-XR   Level 3    Minutes 15    METs 4.1      T5 Nustep   Level 3    Minutes 15    METs 2      Home Exercise Plan   Plans to continue exercise at Home (comment)   walk, look into gym membership, strength and stretching videos.   Frequency Add 2 additional days to program exercise sessions.    Initial Home Exercises Provided 07/19/23      Oxygen   Maintain Oxygen Saturation 88% or higher          Nutrition:  Target Goals: Understanding of nutrition guidelines, daily intake of sodium 1500mg , cholesterol 200mg , calories 30% from fat and 7% or less from saturated fats, daily to have 5 or more servings of fruits and vegetables.  Education: All About Nutrition: -Group instruction provided by verbal, written material, interactive activities, discussions, models, and posters to present general guidelines for heart healthy nutrition including fat, fiber, MyPlate, the role of sodium in heart healthy nutrition, utilization of the nutrition label, and utilization of this knowledge for meal planning. Follow up email sent as well. Written material given at graduation. Flowsheet Row Pulmonary Rehab from 06/16/2023 in [facility] Cardiac [facility] Pulmonary [facility]hab  Education need identified 06/02/23  Date 06/16/23  Educator JG  Instruction Review Code 1- Verbalizes Understanding    Biometrics:  Pre Biometrics - 06/02/23 1550       Pre Biometrics   Height 5' 8 (1.727 m)    Weight 158 lb 6.4 oz (71.8 kg)    Waist Circumference 34 inches    Hip Circumference 36.5 inches    Waist to Hip Ratio 0.93 %    BMI (Calculated) 24.09    Single Leg Stand 5.84 seconds          Post Biometrics - 09/01/23 1144        Post  Biometrics   Height 5' 8 (1.727 m)    Weight 157 lb 11.2 oz (71.5 kg)    Waist  Circumference 36.5 inches    Hip Circumference 38 inches    Waist to Hip Ratio 0.96 %    BMI (Calculated) 23.98    Single Leg Stand 3.7 seconds          Nutrition Therapy Plan and Nutrition Goals:   Nutrition Assessments:  MEDIFICTS Score Key: >=70 Need to make dietary changes  40-70 Heart Healthy Diet <= 40 Therapeutic Level Cholesterol Diet  Flowsheet Row Pulmonary Rehab  from 09/03/2023 in [facility] Cardiac and Pulmonary Rehab  Picture Your Plate Total Score on Admission 59  Picture Your Plate Total Score on Discharge 49   Picture Your Plate Scores: <28 Unhealthy dietary pattern with much room for improvement. 41-50 Dietary pattern unlikely to meet recommendations for good health and room for improvement. 51-60 More healthful dietary pattern, with some room for improvement.  >60 Healthy dietary pattern, although there may be some specific behaviors that could be improved.   Nutrition Goals Re-Evaluation:  Nutrition Goals Re-Evaluation     Row Name 07/14/23 1111 08/23/23 1138           Goals   Current Weight 157 lb (71.2 kg) --      Comment Patient was informed on why it is important to maintain a balanced diet when dealing with Respiratory issues. Explained that it takes a lot of energy to breath and when they are short of breath often they will need to have a good diet to help keep up with the calories they are expending for breathing. Deferred RD appointment      Expected Outcome Short: Choose and plan snacks accordingly to patients caloric intake to improve breathing. Long: Maintain a diet independently that meets their caloric intake to aid in daily shortness of breath. --         Nutrition Goals Discharge (Final Nutrition Goals Re-Evaluation):  Nutrition Goals Re-Evaluation - 08/23/23 1138       Goals   Comment Deferred RD appointment          Psychosocial: Target Goals: Acknowledge presence or absence of significant depression and/or stress, maximize coping  skills, provide positive support system. Participant is able to verbalize types and ability to use techniques and skills needed for reducing stress and depression.   Education: Stress, Anxiety, and Depression - Group verbal and visual presentation to define topics covered.  Reviews how body is impacted by stress, anxiety, and depression.  Also discusses healthy ways to reduce stress and to treat/manage anxiety and depression.  Written material given at graduation. Flowsheet Row Cardiac Rehab from 04/01/2016 in [facility] Cardiac and Pulmonary Rehab  Date 04/01/16  Educator [facility]  Instruction Review Code (retired) 2- meets goals/outcomes    Education: Sleep Hygiene -Provides group verbal and written instruction about how sleep can affect your health.  Define sleep hygiene, discuss sleep cycles and impact of sleep habits. Review good sleep hygiene tips.    Initial Review & Psychosocial Screening:  Initial Psych Review & Screening - 05/31/23 0942       Initial Review   Current issues with None Identified      Family Dynamics   Good Support System? Yes    Comments His wife and two children are a good support system for him. He has five grand kids. He is more angry at the fact that his disease does not let him do the things he wants to do.      Barriers   Psychosocial barriers to participate in program The patient should benefit from training in stress management and relaxation.;There are no identifiable barriers or psychosocial needs.      Screening Interventions   Interventions Encouraged to exercise;Provide feedback about the scores to participant;To provide support and resources with identified psychosocial needs    Expected Outcomes Short Term goal: Utilizing psychosocial counselor, staff and physician to assist with identification of specific Stressors or current issues interfering with healing process. Setting desired goal for each stressor or current issue identified.;Long  Term Goal: Stressors  or current issues are controlled or eliminated.;Short Term goal: Identification and review with participant of any Quality of Life or Depression concerns found by scoring the questionnaire.;Long Term goal: The participant improves quality of Life and PHQ9 Scores as seen by post scores and/or verbalization of changes          Quality of Life Scores:  Scores of 19 and below usually indicate a poorer quality of life in these areas.  A difference of  2-3 points is a clinically meaningful difference.  A difference of 2-3 points in the total score of the Quality of Life Index has been associated with significant improvement in overall quality of life, self-image, physical symptoms, and general health in studies assessing change in quality of life.  PHQ-9: Review Flowsheet  More data exists      09/03/2023 06/02/2023 01/05/2022 12/12/2021 09/29/2021  Depression screen PHQ 2/9  Decreased Interest 1 0 3 2 0  Down, Depressed, Hopeless 1 0 2 1 1   PHQ - 2 Score 2 0 5 3 1   Altered sleeping 1 0 1 3 3   Tired, decreased energy 1 0 3 1 2   Change in appetite 1 0 3 1 1   Feeling bad or failure about yourself  1 0 2 0 1  Trouble concentrating 1 0 1 0 0  Moving slowly or fidgety/restless 1 0 1 0 0  Suicidal thoughts 0 0 0 0 0  PHQ-9 Score 8 0 16 8 8   Difficult doing work/chores - Not difficult at all Not difficult at all Somewhat difficult Somewhat difficult   Interpretation of Total Score  Total Score Depression Severity:  1-4 = Minimal depression, 5-9 = Mild depression, 10-14 = Moderate depression, 15-19 = Moderately severe depression, 20-27 = Severe depression   Psychosocial Evaluation and Intervention:  Psychosocial Evaluation - 05/31/23 0944       Psychosocial Evaluation & Interventions   Interventions Encouraged to exercise with the program and follow exercise prescription;Relaxation education;Stress management education    Comments His wife and two children are a good support system for him. He  has five grand kids. He is more angry at the fact that his disease does not let him do the things he wants to do.    Expected Outcomes Short: Attend [facility] stress management education to decrease stress. Long: Maintain exercise Post [facility] to keep stress at a minimum.    Continue Psychosocial Services  Follow up required by staff          Psychosocial Re-Evaluation:  Psychosocial Re-Evaluation     Row Name 07/14/23 1112 08/23/23 1142           Psychosocial Re-Evaluation   Current issues with Current Depression Current Depression      Comments Kaisen is more depressed than he was becasue he sees that he is not improving with his breathing. There is things that he is realizing that he cant do anymore and is trying to make the best of it. Emitt states that he has felt a little better and he has come to terms that his lung health is not going to improve. He want to go to the beach and do some other things but cant since his oxygen gets low on exertion.      Expected Outcomes Short: Attend [facility] stress management education to decrease stress. Long: Maintain exercise Post [facility] to keep stress at a minimum. Short: maintain a healthy mental state. Long: keep a healthy mental state.  Interventions Encouraged to attend Pulmonary Rehabilitation for the exercise Encouraged to attend Pulmonary Rehabilitation for the exercise      Continue Psychosocial Services  Follow up required by staff Follow up required by staff         Psychosocial Discharge (Final Psychosocial Re-Evaluation):  Psychosocial Re-Evaluation - 08/23/23 1142       Psychosocial Re-Evaluation   Current issues with Current Depression    Comments Neco states that he has felt a little better and he has come to terms that his lung health is not going to improve. He want to go to the beach and do some other things but cant since his oxygen gets low on exertion.    Expected Outcomes Short: maintain a healthy mental  state. Long: keep a healthy mental state.    Interventions Encouraged to attend Pulmonary Rehabilitation for the exercise    Continue Psychosocial Services  Follow up required by staff          Education: Education Goals: Education classes will be provided on a weekly basis, covering required topics. Participant will state understanding/return demonstration of topics presented.  Learning Barriers/Preferences:  Learning Barriers/Preferences - 05/31/23 0940       Learning Barriers/Preferences   Learning Barriers Sight    Learning Preferences None          General Pulmonary Education Topics:  Infection Prevention: - Provides verbal and written material to individual with discussion of infection control including proper hand washing and proper equipment cleaning during exercise session. Flowsheet Row Pulmonary Rehab from 06/16/2023 in [facility] Cardiac and Pulmonary Rehab  Date 06/02/23  Educator [facility]  Instruction Review Code 1- Verbalizes Understanding    Falls Prevention: - Provides verbal and written material to individual with discussion of falls prevention and safety. Flowsheet Row Pulmonary Rehab from 06/16/2023 in [facility] Cardiac and Pulmonary Rehab  Date 06/02/23  Educator [facility]  Instruction Review Code 1- Verbalizes Understanding    Chronic Lung Disease Review: - Group verbal instruction with posters, models, PowerPoint presentations and videos,  to review new updates, new respiratory medications, new advancements in procedures and treatments. Providing information on websites and 800 numbers for continued self-education. Includes information about supplement oxygen, available portable oxygen systems, continuous and intermittent flow rates, oxygen safety, concentrators, and Medicare reimbursement for oxygen. Explanation of Pulmonary Drugs, including class, frequency, complications, importance of spacers, rinsing mouth after steroid MDI's, and proper cleaning methods for nebulizers.  Review of basic lung anatomy and physiology related to function, structure, and complications of lung disease. Review of risk factors. Discussion about methods for diagnosing sleep apnea and types of masks and machines for OSA. Includes a review of the use of types of environmental controls: home humidity, furnaces, filters, dust mite/pet prevention, HEPA vacuums. Discussion about weather changes, air quality and the benefits of nasal washing. Instruction on Warning signs, infection symptoms, calling MD promptly, preventive modes, and value of vaccinations. Review of effective airway clearance, coughing and/or vibration techniques. Emphasizing that all should Create an Action Plan. Written material given at graduation. Flowsheet Row Pulmonary Rehab from 12/17/2021 in [facility] Cardiac and Pulmonary Rehab  Education need identified 09/29/21  Date 11/06/21  Educator [facility]  Instruction Review Code 1- Verbalizes Understanding    AED/CPR: - Group verbal and written instruction with the use of models to demonstrate the basic use of the AED with the basic ABC's of resuscitation.    Anatomy and Cardiac Procedures: - Group verbal and visual presentation and models provide information about basic  cardiac anatomy and function. Reviews the testing methods done to diagnose heart disease and the outcomes of the test results. Describes the treatment choices: Medical Management, Angioplasty, or Coronary Bypass Surgery for treating various heart conditions including Myocardial Infarction, Angina, Valve Disease, and Cardiac Arrhythmias.  Written material given at graduation. Flowsheet Row Cardiac Rehab from 04/01/2016 in [facility] Cardiac and Pulmonary Rehab  Date 02/24/16  Educator TS  Instruction Review Code (retired) 2- meets goals/outcomes    Medication Safety: - Group verbal and visual instruction to review commonly prescribed medications for heart and lung disease. Reviews the medication, class of the drug, and side  effects. Includes the steps to properly store meds and maintain the prescription regimen.  Written material given at graduation. Flowsheet Row Cardiac Rehab from 04/01/2016 in [facility] Cardiac and Pulmonary Rehab  Date 03/11/16  Mario Sicard 2]  Educator TS  Instruction Review Code (retired) 2- meets goals/outcomes    Other: -Provides group and verbal instruction on various topics (see comments)   Knowledge Questionnaire Score:  Knowledge Questionnaire Score - 09/03/23 1132       Knowledge Questionnaire Score   Pre Score 25    Post Score 16/18           Core Components/Risk Factors/Patient Goals at Admission:  Personal Goals and Risk Factors at Admission - 05/31/23 0940       Core Components/Risk Factors/Patient Goals on Admission    Weight Management Yes;Weight Maintenance    Intervention Weight Management: Develop a combined nutrition and exercise program designed to reach desired caloric intake, while maintaining appropriate intake of nutrient and fiber, sodium and fats, and appropriate energy expenditure required for the weight goal.;Weight Management: Provide education and appropriate resources to help participant work on and attain dietary goals.;Weight Management/Obesity: Establish reasonable short term and long term weight goals.    Expected Outcomes Short Term: Continue to assess and modify interventions until short term weight is achieved;Understanding recommendations for meals to include 15-35% energy as protein, 25-35% energy from fat, 35-60% energy from carbohydrates, less than 200mg  of dietary cholesterol, 20-35 gm of total fiber daily;Understanding of distribution of calorie intake throughout the day with the consumption of 4-5 meals/snacks;Long Term: Adherence to nutrition and physical activity/exercise program aimed toward attainment of established weight goal;Weight Maintenance: Understanding of the daily nutrition guidelines, which includes 25-35% calories from fat, 7% or less  cal from saturated fats, less than 200mg  cholesterol, less than 1.5gm of sodium, & 5 or more servings of fruits and vegetables daily    Improve shortness of breath with ADL's Yes    Intervention Provide education, individualized exercise plan and daily activity instruction to help decrease symptoms of SOB with activities of daily living.    Expected Outcomes Short Term: Improve cardiorespiratory fitness to achieve a reduction of symptoms when performing ADLs    Diabetes Yes    Intervention Provide education about signs/symptoms and action to take for hypo/hyperglycemia.;Provide education about proper nutrition, including hydration, and aerobic/resistive exercise prescription along with prescribed medications to achieve blood glucose in normal ranges: Fasting glucose 65-99 mg/dL    Expected Outcomes Short Term: Participant verbalizes understanding of the signs/symptoms and immediate care of hyper/hypoglycemia, proper foot care and importance of medication, aerobic/resistive exercise and nutrition plan for blood glucose control.;Long Term: Attainment of HbA1C < 7%.    Hypertension Yes    Intervention Provide education on lifestyle modifcations including regular physical activity/exercise, weight management, moderate sodium restriction and increased consumption of fresh fruit, vegetables, and low fat dairy,  alcohol moderation, and smoking cessation.;Monitor prescription use compliance.    Expected Outcomes Short Term: Continued assessment and intervention until BP is < 140/11mm HG in hypertensive participants. < 130/58mm HG in hypertensive participants with diabetes, heart failure or chronic kidney disease.;Long Term: Maintenance of blood pressure at goal levels.    Lipids Yes    Intervention Provide education and support for participant on nutrition & aerobic/resistive exercise along with prescribed medications to achieve LDL 70mg , HDL >40mg .    Expected Outcomes Short Term: Participant states  understanding of desired cholesterol values and is compliant with medications prescribed. Participant is following exercise prescription and nutrition guidelines.;Long Term: Cholesterol controlled with medications as prescribed, with individualized exercise RX and with personalized nutrition plan. Value goals: LDL < 70mg , HDL > 40 mg.          Education:Diabetes - Individual verbal and written instruction to review signs/symptoms of diabetes, desired ranges of glucose level fasting, after meals and with exercise. Acknowledge that pre and post exercise glucose checks will be done for 3 sessions at entry of program. Flowsheet Row Pulmonary Rehab from 06/16/2023 in [facility] Cardiac and Pulmonary Rehab  Date 06/02/23  Educator [facility]  Instruction Review Code 1- Verbalizes Understanding    Know Your Numbers and Heart Failure: - Group verbal and visual instruction to discuss disease risk factors for cardiac and pulmonary disease and treatment options.  Reviews associated critical values for Overweight/Obesity, Hypertension, Cholesterol, and Diabetes.  Discusses basics of heart failure: signs/symptoms and treatments.  Introduces Heart Failure Zone chart for action plan for heart failure.  Written material given at graduation. Flowsheet Row Pulmonary Rehab from 12/17/2021 in [facility] Cardiac and Pulmonary Rehab  Date 10/29/21  Educator SB  Instruction Review Code 1- Verbalizes Understanding    Core Components/Risk Factors/Patient Goals Review:   Goals and Risk Factor Review     Row Name 07/14/23 1110 08/23/23 1138           Core Components/Risk Factors/Patient Goals Review   Personal Goals Review Improve shortness of breath with ADL's Weight Management/Obesity      Review Spoke to patient about their shortness of breath and what they can do to improve. Patient has been informed of breathing techniques when starting the program. Patient is informed to tell staff if they have had any med changes and that  certain meds they are taking or not taking can be causing shortness of breath. Gleason states that he has lost weight and is at his ideal body weight. He like the fact that he feels stronger and has less weight to carry.      Expected Outcomes Short: Attend [facility] regularly to improve shortness of breath with ADL's. Long: maintain independence with ADL's Short: continue maintaining weight. Long: maintain weight independently.         Core Components/Risk Factors/Patient Goals at Discharge (Final Review):   Goals and Risk Factor Review - 08/23/23 1138       Core Components/Risk Factors/Patient Goals Review   Personal Goals Review Weight Management/Obesity    Review Christon states that he has lost weight and is at his ideal body weight. He like the fact that he feels stronger and has less weight to carry.    Expected Outcomes Short: continue maintaining weight. Long: maintain weight independently.          ITP Comments:  ITP Comments     Row Name 05/31/23 6295 06/02/23 1544 06/14/23 1622 06/30/23 0950 07/28/23 1046   ITP Comments Virtual Visit completed.  Patient informed on EP and RD appointment and 6 Minute walk test. Patient also informed of patient health questionnaires on My Chart. Patient Verbalizes understanding. Visit diagnosis can be found in [facility] media, patient is [place]. Completed and gym orientation. Initial ITP created and sent for review to Dr. Faud Aleskerov, Medical Director. First full day of exercise!  Patient was oriented to gym and equipment including functions, settings, policies, and procedures.  Patient's individual exercise prescription and treatment plan were reviewed.  All starting workloads were established based on the results of the 6 minute walk test done at initial orientation visit.  The plan for exercise progression was also introduced and progression will be customized based on patient's performance and goals. 30 Day review completed. Medical Director ITP review  done, changes made as directed, and signed approval by Medical Director.    new to program 30 Day review completed. Medical Director ITP review done, changes made as directed, and signed approval by Medical Director.    Row Name 08/25/23 1045 09/10/23 1123         ITP Comments 30 Day review completed. Medical Director ITP review done, changes made as directed, and signed approval by Medical Director. Ademola graduated today from  rehab with 34 sessions completed.  Details of the patient's exercise prescription and what He needs to do in order to continue the prescription and progress were discussed with patient.  Patient was given a copy of prescription and goals.  Patient verbalized understanding. Jordynn plans to continue to exercise by walking at home.         Comments: Discharge ITP

## 2023-09-10 NOTE — Progress Notes (Signed)
 Discharge Note for  Brian Casey     [birth year]         [name] graduated today from  rehab with 34 sessions completed.  Details of the patient's exercise prescription and what He needs to do in order to continue the prescription and progress were discussed with patient.  Patient was given a copy of prescription and goals.  Patient verbalized understanding. [name] plans to continue to exercise by walking at home.    6 Minute Walk     Row Name 06/02/23 1540 09/01/23 1141       6 Minute Walk   Phase Initial Discharge    Distance 900 feet 725 feet    Distance % Change -- -19.4 %    Distance Feet Change -- -175 ft    Walk Time 6 minutes 4.92 minutes    # of Rest Breaks 0 1    MPH 1.7 1.68    METS 2.26 1.96    RPE 13 15    Perceived Dyspnea  2 3    VO2 Peak 7.9 6.87    Symptoms No No    Resting HR 73 bpm 67 bpm    Resting BP 104/62 106/66    Resting Oxygen Saturation  91 % 98 %    Exercise Oxygen Saturation  during 6 min walk 86 % 85 %    Max Ex. HR 106 bpm 103 bpm    Max Ex. BP 110/64 114/64    2 Minute Post BP 108/62 120/66      Interval HR   1 Minute HR 80 84    2 Minute HR 90 91    3 Minute HR -- 93    4 Minute HR 64 103    5 Minute HR 71 91    6 Minute HR 106 102    2 Minute Post HR 82 79    Interval Heart Rate? Yes Yes      Interval Oxygen   Interval Oxygen? Yes Yes    Baseline Oxygen Saturation % 91 % 98 %    1 Minute Oxygen Saturation % 93 % 93 %    1 Minute Liters of Oxygen 4 L 4 L    2 Minute Oxygen Saturation % 93 % 91 %    2 Minute Liters of Oxygen 4 L 4 L    3 Minute Oxygen Saturation % 91 % 87 %    3 Minute Liters of Oxygen 4 L 4 L    4 Minute Oxygen Saturation % 89 % 85 %    4 Minute Liters of Oxygen 4 L 4 L    5 Minute Oxygen Saturation % 87 % 88 %    5 Minute Liters of Oxygen 4 L 4 L    6 Minute Oxygen Saturation % 86 % 88 %    6 Minute Liters of Oxygen 4 L 4 L    2 Minute Post Oxygen Saturation % 97 % 87 %    2 Minute Post Liters of Oxygen 4 L 4 L

## 2023-09-10 NOTE — Progress Notes (Signed)
 Daily Session Note  Patient Details  Name: Brian Casey MRN: [id number] Date of Birth: 1950-10-30 Referring Provider:   Flowsheet Row Pulmonary Rehab from 06/02/2023 in [facility] Cardiac and Pulmonary Rehab  Referring Provider Dr. Alphonso Aschoff    Encounter Date: 09/10/2023  Check In:  Session Check In - 09/10/23 1116       Check-In   Supervising physician immediately available to respond to emergencies See telemetry face sheet for immediately available ER MD    Location [facility]-Cardiac & Pulmonary Rehab    Staff Present Maud Sorenson, RN, BSN, CCRP;Joseph Hood RCP,RRT,BSRT;Maxon Hawthorne BS, Exercise Physiologist;Noah Tickle, BS, Exercise Physiologist    Virtual Visit No    Medication changes reported     No    Fall or balance concerns reported    No    Warm-up and Cool-down Performed on first and last piece of equipment    Resistance Training Performed Yes    VAD Patient? No    PAD/SET Patient? No      Pain Assessment   Currently in Pain? No/denies             Social History   Tobacco Use  Smoking Status Former   Current packs/day: 0.00   Average packs/day: 1 pack/day for 48.0 years (48.0 ttl pk-yrs)   Types: Cigarettes   Start date: 11/07/1967   Quit date: 11/07/2015   Years since quitting: 7.8  Smokeless Tobacco Never    Goals Met:  Proper associated with RPD/PD & O2 Sat Independence with exercise equipment Exercise tolerated well No report of concerns or symptoms today  Goals Unmet:  Not Applicable  Comments: Pt able to follow exercise prescription today without complaint.    Brian Casey graduated today from  rehab with 34 sessions completed.  Details of the patient's exercise prescription and what He needs to do in order to continue the prescription and progress were discussed with patient.  Patient was given a copy of prescription and goals.  Patient verbalized understanding. Brian Casey plans to continue to exercise by walking at home.   Dr. Firman Hughes is Medical  Director for [facility] Cardiac Rehabilitation.  Dr. Fuad Aleskerov is Medical Director for [facility] Pulmonary Rehabilitation.

## 2023-09-13 ENCOUNTER — Encounter

## 2023-09-21 ENCOUNTER — Other Ambulatory Visit (HOSPITAL_COMMUNITY): Payer: Self-pay

## 2023-09-21 ENCOUNTER — Other Ambulatory Visit: Payer: Self-pay

## 2023-09-21 MED ORDER — ATORVASTATIN CALCIUM 40 MG PO TABS
40.0000 mg | ORAL_TABLET | Freq: Every day | ORAL | 3 refills | Status: DC
  Filled 2023-09-21: qty 90, 90d supply, fill #0

## 2023-10-04 ENCOUNTER — Other Ambulatory Visit (HOSPITAL_COMMUNITY): Payer: Self-pay

## 2023-10-04 ENCOUNTER — Other Ambulatory Visit: Payer: Self-pay

## 2023-10-04 MED ORDER — CARVEDILOL 12.5 MG PO TABS
12.5000 mg | ORAL_TABLET | Freq: Two times a day (BID) | ORAL | 3 refills | Status: AC
  Filled 2023-10-04: qty 180, 90d supply, fill #0
  Filled 2024-01-04: qty 180, 90d supply, fill #1
  Filled 2024-04-11: qty 180, 90d supply, fill #2

## 2023-10-06 ENCOUNTER — Other Ambulatory Visit (HOSPITAL_COMMUNITY): Payer: Self-pay

## 2023-10-07 ENCOUNTER — Encounter: Payer: Self-pay | Admitting: Pulmonary Disease

## 2023-10-07 ENCOUNTER — Ambulatory Visit (INDEPENDENT_AMBULATORY_CARE_PROVIDER_SITE_OTHER): Payer: Worker's Compensation | Admitting: Pulmonary Disease

## 2023-10-07 ENCOUNTER — Other Ambulatory Visit: Payer: Self-pay

## 2023-10-07 ENCOUNTER — Other Ambulatory Visit (HOSPITAL_COMMUNITY): Payer: Self-pay

## 2023-10-07 VITALS — BP 88/60 | HR 82 | Temp 97.6°F | Ht 68.0 in | Wt 157.6 lb

## 2023-10-07 DIAGNOSIS — R0602 Shortness of breath: Secondary | ICD-10-CM

## 2023-10-07 DIAGNOSIS — J986 Disorders of diaphragm: Secondary | ICD-10-CM | POA: Diagnosis not present

## 2023-10-07 DIAGNOSIS — J439 Emphysema, unspecified: Secondary | ICD-10-CM

## 2023-10-07 DIAGNOSIS — Z87891 Personal history of nicotine dependence: Secondary | ICD-10-CM

## 2023-10-07 DIAGNOSIS — R053 Chronic cough: Secondary | ICD-10-CM | POA: Diagnosis not present

## 2023-10-07 DIAGNOSIS — J849 Interstitial pulmonary disease, unspecified: Secondary | ICD-10-CM

## 2023-10-07 DIAGNOSIS — K219 Gastro-esophageal reflux disease without esophagitis: Secondary | ICD-10-CM

## 2023-10-07 DIAGNOSIS — R918 Other nonspecific abnormal finding of lung field: Secondary | ICD-10-CM

## 2023-10-07 LAB — NITRIC OXIDE: Nitric Oxide: 18

## 2023-10-07 MED ORDER — AEROCHAMBER MV MISC
0 refills | Status: AC
  Filled 2023-10-07: qty 1, 1d supply, fill #0

## 2023-10-07 MED ORDER — ESOMEPRAZOLE MAGNESIUM 40 MG PO CPDR
40.0000 mg | DELAYED_RELEASE_CAPSULE | Freq: Two times a day (BID) | ORAL | 2 refills | Status: DC
  Filled 2023-10-07: qty 60, 30d supply, fill #0

## 2023-10-07 MED ORDER — BREZTRI AEROSPHERE 160-9-4.8 MCG/ACT IN AERO: 2.0000 | INHALATION_SPRAY | Freq: Two times a day (BID) | RESPIRATORY_TRACT | Status: DC

## 2023-10-07 NOTE — Patient Instructions (Signed)
 VISIT SUMMARY:  Today, we discussed the management of your interstitial lung disease and other related conditions. We reviewed your symptoms, current medications, and recent changes in your health. We also discussed your history of exposure to asbestos and mold, which may have contributed to your lung condition. We have made some adjustments to your treatment plan to help manage your symptoms more effectively.  YOUR PLAN:  -CHRONIC HYPERSENSITIVITY PNEUMONITIS: This is a lung condition caused by an allergic reaction to inhaled environmental substances like mold and asbestos. We will order a blood test to identify potential allergens and review your CT scan images to explain the findings.  -CHRONIC OBSTRUCTIVE PULMONARY DISEASE (COPD): This is a chronic lung disease that makes it hard to breathe. It often coexists with other lung conditions. We will provide you with an inhaler to help manage your symptoms and instruct you on its proper use.  -WHEEZING: This is a high-pitched whistling sound made while breathing, often related to lung conditions like COPD. We will check your airway inflammation level and provide inhaler samples to help manage the wheezing.  -DIAPHRAGMATIC DYSFUNCTION: This is a condition where the diaphragm does not move properly, affecting breathing. We will order a sniff test to evaluate your diaphragm movement.  -GASTROESOPHAGEAL REFLUX DISEASE (GERD): This is a condition where stomach acid frequently flows back into the tube connecting your mouth and stomach, causing symptoms like a burning throat and cough. We will increase your reflux medication to Nexium  40 mg twice daily for two months and monitor for improvement.  INSTRUCTIONS:  Please follow up with the blood test to identify potential allergens and the sniff test to evaluate your diaphragm movement. Use the inhaler as instructed and rinse your mouth after each use. Take Nexium  40 mg twice daily for two months and monitor  your symptoms. We will review your progress and make any necessary adjustments at your next appointment.

## 2023-10-07 NOTE — Progress Notes (Unsigned)
 Subjective:    Patient ID: Brian Casey, male    DOB: November 23, 1950, 73 y.o.   MRN: [id number]  Patient Care Team: Valora Agent, MD as PCP - General (Family Medicine) Perla Evalene PARAS, MD as PCP - Cardiology (Cardiology) Obadiah Coy, MD as Consulting Physician (Cardiothoracic Surgery) Geronimo Amel, MD as Consulting Physician (Pulmonary Disease)  Chief Complaint  Patient presents with   Consult    Cough with occasional phlegm. Shortness of breath with any activity.     BACKGROUND: Draylon Mercadel is a 73 year old former smoker, very complex patient with a history as noted below who has been followed by Dr. Geronimo for interstitial lung disease.  He has chronic respiratory failure due to the same.   HPI Discussed the use of AI scribe software for clinical note transcription with the patient, who gave verbal consent to proceed.  History of Present Illness   Brian Casey is a 73 year old male with interstitial lung disease who presents for transition of care and management of his condition. He was referred by a hospital staff member for interstitial lung disease management.  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 Geronimo in [place] but is transitioning care to [facility] due to travel inconvenience.  He is currently taking Esbriet  twice daily instead of the prescribed three times due to previous intolerance to Ofev , which caused severe diarrhea. He tolerates the current regimen better, though he experiences nausea with three doses.  He developed a persistent cough about three months ago, which worsens with activities such as bathing or brushing teeth, and is accompanied by phlegm that is white to yellowish. He uses oxygen at two liters per minute at night and experiences burning in his throat, which prevents him from using mouthwash.  His cough initially began in December 2022, and his  breathing has progressively worsened since then. His breathing worsened in July 2023 after a decline in his respiratory function.  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  He takes Prilosec 20 mg for reflux, which causes burning in his throat and may contribute to his cough. He coughs upon lying down at night, which eventually subsides allowing him to sleep.  He has undergone pulmonary rehabilitation, but his performance on the walk test worsened by the end of the program.     DATA 11/11/2015 PFTs: FEV1 0.72 L or 22% predicted, FVC 1.20 L or 28% predicted, FEV1/FVC 60%, no significant bronchodilator response.  Lung volumes are severely reduced.  The reduction in lung volumes may underestimate the severity of the obstruction.  There is severe diffusion capacity impairment.  This is consistent with an MIXED obstructive and restrictive defect. 02/03/2022 right heart cath: PA 38/12 mean 21 mmHg, PCWP 13 mmHg, Fick cardiac output 6.9 L/min, Fick cardiac index 3.5 L/min.  Normal left and right filling pressures.  Mild pulmonary hypertension (mean PAP 21 mmHg) normal cardiac output and low normal to mildly reduced thermodilution cardiac output.   Review of Systems A 10 point review of systems was performed and it is as noted above otherwise negative.   Past Medical History:  Diagnosis Date   Adenomatous colon polyp    Aortic root dilatation (HCC)    Noted on echo in August 2023.  Measured 39  mm.   Arthritis    lower spine; fingers (11/07/2015)   CAD (coronary artery disease)    a. 10/2015 NSTEMI/Cath: LM 40, LAD 70p, LCX 90ost, RI 90ost, RCA 60-70p, 90-49m/d, 40-50d; b. 10/2015 CABG x 4: LIMA->LAD, VG->Diag, VG->RI, VG->RPDA.   Chronic respiratory failure with hypoxia (HCC)    CKD (chronic kidney disease), stage III  (HCC)    Diastolic dysfunction    a. 10/2021 Echo: EF 60-65%,   Diverticulosis    GERD (gastroesophageal reflux disease)    Grade I diastolic dysfunction    High cholesterol    History of bronchitis    Hypertension 10/17/2013   Hypothyroidism    ILD (interstitial lung disease) (HCC)    Mild pulmonary hypertension (HCC)    PAD (peripheral artery disease) (HCC)    s/p left carotid endarterectomy   Post-op atrial fibrillation (HCC) 11/06/2015   Shortness of breath    Type II diabetes mellitus (HCC)    type II   Wears glasses     Past Surgical History:  Procedure Laterality Date   CARDIAC CATHETERIZATION Right 11/06/2015   Procedure: Left Heart Cath and Coronary Angiography;  Surgeon: Evalene JINNY Lunger, MD;  Location: [facility];  Service: Cardiovascular;  Laterality: Right;   CATARACT EXTRACTION W/PHACO Right 09/09/2022   Procedure: CATARACT EXTRACTION PHACO AND INTRAOCULAR LENS PLACEMENT (IOC) RIGHT DIABETIC OMIDRIA  11.17 01:06.5;  Surgeon: Enola Feliciano Hugger, MD;  Location: [facility];  Service: Ophthalmology;  Laterality: Right;   CATARACT EXTRACTION W/PHACO Left 10/08/2022   Procedure: CATARACT EXTRACTION PHACO AND INTRAOCULAR LENS PLACEMENT (IOC) LEFT DIABETIC OMIDRIA  12.90 01:11.8;  Surgeon: Enola Feliciano Hugger, MD;  Location: [facility];  Service: Ophthalmology;  Laterality: Left;   CHOLECYSTECTOMY N/A 01/17/2016   Procedure: LAPAROSCOPIC CHOLECYSTECTOMY WITH INTRAOPERATIVE CHOLANGIOGRAM;  Surgeon: Elon Pacini, MD;  Location: [facility] OR;  Service: General;  Laterality: N/A;   COLONOSCOPY     COLONOSCOPY N/A 11/19/2021   Procedure: COLONOSCOPY;  Surgeon: Toledo, Ladell POUR, MD;  Location: [facility];  Service: Gastroenterology;  Laterality: N/A;   CORONARY ARTERY BYPASS GRAFT N/A 11/14/2015   Procedure: CORONARY ARTERY BYPASS GRAFTING (CABG) x 4;  Surgeon: Maude Fleeta Ochoa, MD;  Location: [facility] OR;  Service: Open Heart Surgery;  Laterality: N/A;    ENDARTERECTOMY Left 11/14/2015   Procedure: LEFT ENDARTERECTOMY CAROTID;  Surgeon: Penne Lonni Colorado, MD;  Location: [facility] OR;  Service: Vascular;  Laterality: Left;   ESOPHAGOGASTRODUODENOSCOPY N/A 11/19/2021   Procedure: ESOPHAGOGASTRODUODENOSCOPY (EGD);  Surgeon: Toledo, Ladell POUR, MD;  Location: [facility];  Service: Gastroenterology;  Laterality: N/A;   EXCISION OF MESH N/A 06/18/2022   Procedure: EXCISION OF MESH;  Surgeon: Belinda Cough, MD;  Location: [facility] OR;  Service: General;  Laterality: N/A;   FINGER GANGLION CYST EXCISION Left X 3   index finger X 2; thumb X 1   HEMORRHOID SURGERY  1982   HERNIA REPAIR     INSERTION OF MESH N/A 11/16/2013   Procedure: INSERTION OF MESH;  Surgeon: Cough POUR. Belinda, MD;  Location: [facility] OR;  Service: General;  Laterality: N/A;   IR GENERIC HISTORICAL  11/08/2015   IR PERC CHOLECYSTOSTOMY 11/08/2015 Ami Bellman, DO [facility]   IR GENERIC HISTORICAL  12/13/2015   IR CHOLANGIOGRAM EXISTING TUBE 12/13/2015 [facility]   IR GENERIC HISTORICAL  12/25/2015   IR CATHETER TUBE CHANGE 12/25/2015 Norleen Roulette, MD [facility]   IR GENERIC HISTORICAL  12/12/2015   IR RADIOLOGIST EVAL & MGMT 12/12/2015 Ozell Specking,  MD [facility]   RIGHT HEART CATH Right 02/03/2022   Procedure: RIGHT HEART CATH;  Surgeon: Mady Bruckner, MD;  Location: [facility];  Service: Cardiovascular;  Laterality: Right;   TEE WITHOUT CARDIOVERSION N/A 11/14/2015   Procedure: TRANSESOPHAGEAL ECHOCARDIOGRAM (TEE);  Surgeon: Maude Fleeta Ochoa, MD;  Location: [facility] OR;  Service: Open Heart Surgery;  Laterality: N/A;   UMBILICAL HERNIA REPAIR N/A 11/16/2013   Procedure: UMBILICAL HERNIA REPAIR;  Surgeon: Donnice POUR. Belinda, MD;  Location: [facility] OR;  Service: General;  Laterality: N/A;   UMBILICAL HERNIA REPAIR N/A 06/18/2022   Procedure: OPEN UMBILICAL HERNIA REPAIR;  Surgeon: Belinda Donnice, MD;  Location: [facility] OR;  Service: General;  Laterality: N/A;   VEIN HARVEST Right  11/14/2015   Procedure: RIGHT LEG GREATER SAPHENOUS VEIN HARVEST;  Surgeon: Maude Fleeta Ochoa, MD;  Location: [facility] OR;  Service: Open Heart Surgery;  Laterality: Right;    Patient Active Problem List   Diagnosis Date Noted   Occlusion and stenosis of unspecified carotid artery 04/19/2023   Pedal edema 04/19/2023   High risk medication use 06/05/2022   ILD (interstitial lung disease) (HCC) 04/28/2022   Chronic respiratory failure with hypoxia (HCC) 04/28/2022   Preop pulmonary/respiratory exam 04/28/2022   Shortness of breath 02/03/2022   Diarrhea due to drug 01/22/2022   Tubular adenoma of colon 01/22/2022   Dizziness 08/12/2021   Edema, unspecified 08/12/2021   Erectile dysfunction 08/12/2021   Family history of colon cancer 08/12/2021   GERD (gastroesophageal reflux disease) 08/12/2021   History of hemorrhoids 08/12/2021   Occlusion and stenosis of bilateral carotid arteries 08/12/2021   Pleural effusion 08/27/2017   Acute renal failure (HCC) 08/27/2017   Hyponatremia 08/27/2017   Acute renal failure (ARF) (HCC) 08/27/2017   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   Bilateral carotid artery disease (HCC)    Carotid stenosis 11/09/2015   CKD (chronic kidney disease), 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 11/09/2015   Protein-calorie malnutrition, severe (HCC) 11/09/2015   Sepsis due to Gram negative bacteria (HCC) 11/09/2015   Left ventricular diastolic dysfunction/grade 2 -> Grade 1 DD (10/2021) 11/09/2015   Situational depression 11/06/2015   Generalized anxiety disorder 11/06/2015   Pre-op evaluation    NSTEMI (non-ST elevated myocardial infarction) (HCC)    Coronary artery disease involving native coronary artery of native heart with angina pectoris (HCC)    Acute cholecystitis without calculus 11/04/2015   PAF (paroxysmal atrial fibrillation) (HCC) 11/04/2015   AKI (acute kidney injury) (HCC)    Elevated troponin I level    Coronary artery disease involving native coronary artery of native heart with  angina pectoris with documented spasm (HCC)    Aortic atherosclerosis (HCC)    Visit for well man health check 11/06/2014   Counseling on health promotion and disease prevention 03/29/2014   Umbilical hernia 10/31/2013   Rectus diastasis 10/31/2013   Essential hypertension 10/17/2013   Weight gain 10/11/2013   Swelling of right knee joint 10/11/2013   Puncture wound 08/18/2013   Left low back pain 04/13/2013   HLD (hyperlipidemia) 01/26/2011   Hypothyroidism 03/02/2007   Diabetes mellitus type 2, uncontrolled 03/02/2007   NICOTINE ADDICTION 03/02/2007   DEPRESSION/ ANGER CONTROL 03/02/2007    Family History  Problem Relation Age of Onset   Hypertension Mother    Diabetes Mother    Depression Mother    Colon cancer Mother    Hyperthyroidism Father    Hypertension Father    Prostate cancer Father  Diabetes Maternal Grandmother    Lung cancer Maternal Grandmother        non smoker    Social History   Tobacco Use   Smoking status: Former    Current packs/day: 0.00    Average packs/day: 1 pack/day for 48.0 years (48.0 ttl pk-yrs)    Types: Cigarettes    Start date: 11/07/1967    Quit date: 11/07/2015    Years since quitting: 7.9   Smokeless tobacco: Never  Substance Use Topics   Alcohol use: Yes    Alcohol/week: 3.0 standard drinks of alcohol    Types: 3 Cans of beer per week    Comment: occas    Allergies  Allergen Reactions   Penicillins Nausea And Vomiting and Other (See Comments)    REACTION: Vomiting Has patient had a PCN reaction causing immediate rash, facial/tongue/throat swelling, SOB or lightheadedness with hypotension: Yes Has patient had a PCN reaction causing severe rash involving mucus membranes or skin necrosis: No Has patient had a PCN reaction that required hospitalization No Has patient had a PCN reaction occurring within the last 10 years: No If all of the above answers are NO, then may proceed with Cephalosporin use.   Amlodipine Swelling    Bupropion Hcl     REACTION: Severe constipation, insomnia   Chlorthalidone Other (See Comments)    Other reaction(s): Hyponatremia   Metformin  Diarrhea   Nintedanib Diarrhea and Other (See Comments)   Terazosin Other (See Comments)    Current Meds  Medication Sig   aspirin  EC 81 MG tablet Take 81 mg by mouth daily. Swallow whole.   atorvastatin  (LIPITOR) 40 MG tablet Take 1 tablet (40 mg total) by mouth daily.   budesonide-glycopyrrolate -formoterol  (BREZTRI  AEROSPHERE) 160-9-4.8 MCG/ACT AERO inhaler Inhale 2 puffs into the lungs in the morning and at bedtime.   carvedilol  (COREG ) 12.5 MG tablet Take 1 tablet (12.5 mg total) by mouth 2 (two) times daily with a meal.   empagliflozin (JARDIANCE) 10 MG TABS tablet Take 25 mg by mouth daily.   esomeprazole  (NEXIUM ) 40 MG capsule Take 1 capsule (40 mg total) by mouth 2 (two) times daily before a meal.   ezetimibe  (ZETIA ) 10 MG tablet Take 1 tablet (10 mg total) by mouth daily.   furosemide  (LASIX ) 20 MG tablet Take 1 tablet (20 mg total) by mouth daily.   levothyroxine  (SYNTHROID ) 100 MCG tablet Take 1 tablet (100 mcg total) by mouth once daily on an empty stomach with a glass o water at least 30-60 minutes before breakfast   OXYGEN Inhale 4 L into the lungs daily.   Pirfenidone  801 MG TABS Take 1 tablet (801 mg total) by mouth in the morning and at bedtime. **low dose as maintenance**   predniSONE  (DELTASONE ) 10 MG tablet Take 1 tablet (10 mg total) by mouth daily with breakfast.   Spacer/Aero-Holding Chambers (AEROCHAMBER MV) inhaler Use as instructed   tadalafil  (CIALIS ) 10 MG tablet Take 1 tablet (10 mg total) by mouth daily as needed for erectile dysfunction.   [DISCONTINUED] omeprazole  (PRILOSEC) 20 MG capsule Take 1 capsule (20 mg total) by mouth daily.    Immunization History  Administered Date(s) Administered   Fluad Quad(high Dose 65+) 01/13/2022   Fluad Trivalent(High Dose 65+) 12/22/2017   Influenza Inj Mdck Quad Pf 12/25/2015    Influenza Split 01/26/2011, 02/10/2012   Influenza Whole 01/22/2004, 02/05/2008, 12/26/2008   Influenza, High Dose Seasonal PF 01/12/2023   Influenza,inj,Quad PF,6+ Mos 12/29/2012, 01/10/2014, 12/11/2014   Influenza-Unspecified 12/25/2016,  12/22/2017, 12/21/2018, 12/07/2019, 12/24/2020   PFIZER Comirnaty(Gray Top)Covid-19 Tri-Sucrose Vaccine 04/09/2019, 05/02/2019, 12/28/2019, 01/01/2021   PNEUMOCOCCAL CONJUGATE-20 03/04/2023   Pfizer Covid-19 Vaccine Bivalent Booster 74yrs & up 01/02/2021   Pneumococcal Conjugate-13 03/29/2014, 11/06/2014, 11/15/2017   Pneumococcal Polysaccharide-23 11/05/2016   RSV,unspecified 02/19/2022   Respiratory Syncytial Virus Vaccine,Recomb Aduvanted(Arexvy) 02/19/2022   Td 08/22/1998, 08/18/2013   Unspecified SARS-COV-2 Vaccination 02/20/2022   Zoster, Live 03/29/2014        Objective:     BP (!) 88/60 (BP Location: Right Arm, Patient Position: Sitting, Cuff Size: Normal)   Pulse 82   Temp 97.6 F (36.4 C) (Oral)   Ht 5' 8 (1.727 m)   Wt 157 lb 9.6 oz (71.5 kg)   SpO2 91%   BMI 23.96 kg/m   SpO2: 91 % O2 Device: None (Room air)  GENERAL: HEAD: Normocephalic, atraumatic.  EYES: Pupils equal, round, reactive to light.  No scleral icterus.  MOUTH:  NECK: Supple. No thyromegaly. Trachea midline. No JVD.  No adenopathy. PULMONARY: Good air entry bilaterally.  No adventitious sounds. CARDIOVASCULAR: S1 and S2. Regular rate and rhythm.  ABDOMEN: MUSCULOSKELETAL: No joint deformity, no clubbing, no edema.  NEUROLOGIC:  SKIN: Intact,warm,dry. PSYCH:        Assessment & Plan:     ICD-10-CM   1. ILD (interstitial lung disease) (HCC)  J84.9 Nitric oxide     ANA, IFA Comprehensive Panel    Sjogrens syndrome-A extractable nuclear antibody    Sjogrens syndrome-B extractable nuclear antibody    Sedimentation rate    C-reactive protein    Pulmonary function test    Hypersensitivity Pneumonitis    Allergen Panel (27) + IGE    CBC with  Differential/Platelet    2. COPD with chronic bronchitis and emphysema (HCC)  J44.89    J43.9     3. Shortness of breath  R06.02 DG Sniff Test    Pulmonary function test    4. Elevated hemidiaphragm  J98.6 DG Sniff Test      Orders Placed This Encounter  Procedures   DG Sniff Test    Standing Status:   Future    Expected Date:   10/21/2023    Expiration Date:   10/06/2024    Reason for Exam (SYMPTOM  OR DIAGNOSIS REQUIRED):   Elevation Righ hemidiaphragm, shortness of breath    Preferred imaging location?:   [facility]   ANA, IFA Comprehensive Panel   Sjogrens syndrome-A extractable nuclear antibody    Standing Status:   Future    Expiration Date:   10/06/2024   Sjogrens syndrome-B extractable nuclear antibody    Standing Status:   Future    Expiration Date:   10/06/2024   Sedimentation rate    Standing Status:   Future    Expiration Date:   10/06/2024   C-reactive protein    Standing Status:   Future    Expiration Date:   10/06/2024   Hypersensitivity Pneumonitis    Standing Status:   Future    Expiration Date:   10/06/2024   Allergen Panel (27) + IGE    Standing Status:   Future    Expiration Date:   10/06/2024   CBC with Differential/Platelet    Standing Status:   Future    Expected Date:   10/07/2023    Expiration Date:   10/06/2024   Nitric oxide    Pulmonary function test    Standing Status:   Future    Expected Date:   10/21/2023  Expiration Date:   10/06/2024    Where should this test be performed?:   Outpatient Pulmonary    What type of PFT is being ordered?:   Full PFT    Meds ordered this encounter  Medications   esomeprazole  (NEXIUM ) 40 MG capsule    Sig: Take 1 capsule (40 mg total) by mouth 2 (two) times daily before a meal.    Dispense:  60 capsule    Refill:  2   Spacer/Aero-Holding Chambers (AEROCHAMBER MV) inhaler    Sig: Use as instructed    Dispense:  1 each    Refill:  0   budesonide-glycopyrrolate -formoterol  (BREZTRI  AEROSPHERE)  160-9-4.8 MCG/ACT AERO inhaler    Sig: Inhale 2 puffs into the lungs in the morning and at bedtime.    Dispense:  2 each    Lot Number?:   [id number]    Expiration Date?:   12/21/2025    Manufacturer?:   [employer] [[id number]]    NDC:   [phone] [[id number]]    Quantity:   2     Advised if symptoms do not improve or worsen, to please contact office for sooner follow up or seek emergency care.    I spent xxx minutes of dedicated to the care of this patient on the date of this encounter to include pre-visit review of records, face-to-face time with the patient discussing conditions above, post visit ordering of testing, clinical documentation with the electronic health record, making appropriate referrals as documented, and communicating necessary findings to members of the patients care team.   C. Leita Sanders, MD Advanced Bronchoscopy PCCM [facility]    *This note was dictated using voice recognition software/Dragon.  Despite best efforts to proofread, errors can occur which can change the meaning. Any transcriptional errors that result from this process are unintentional and may not be fully corrected at the time of dictation.

## 2023-10-08 ENCOUNTER — Other Ambulatory Visit (HOSPITAL_COMMUNITY): Payer: Self-pay

## 2023-10-08 ENCOUNTER — Encounter: Payer: Self-pay | Admitting: Pulmonary Disease

## 2023-10-19 ENCOUNTER — Other Ambulatory Visit (HOSPITAL_COMMUNITY): Payer: Self-pay

## 2023-10-19 ENCOUNTER — Other Ambulatory Visit: Payer: Self-pay

## 2023-10-19 MED ORDER — LEVOTHYROXINE SODIUM 100 MCG PO TABS
100.0000 ug | ORAL_TABLET | Freq: Every day | ORAL | 3 refills | Status: AC
  Filled 2023-10-19: qty 90, 90d supply, fill #0
  Filled 2024-01-12: qty 90, 90d supply, fill #1
  Filled 2024-04-11: qty 90, 90d supply, fill #2

## 2023-10-22 ENCOUNTER — Ambulatory Visit
Admission: RE | Admit: 2023-10-22 | Discharge: 2023-10-22 | Disposition: A | Discharge status: Home / Self Care | Source: Ambulatory Visit | Attending: Pulmonary Disease | Admitting: Pulmonary Disease

## 2023-10-22 DIAGNOSIS — R0602 Shortness of breath: Secondary | ICD-10-CM | POA: Insufficient documentation

## 2023-10-22 DIAGNOSIS — J986 Disorders of diaphragm: Secondary | ICD-10-CM | POA: Insufficient documentation

## 2023-10-25 ENCOUNTER — Ambulatory Visit: Payer: Self-pay | Admitting: Pulmonary Disease

## 2023-11-02 ENCOUNTER — Other Ambulatory Visit
Admission: RE | Admit: 2023-11-02 | Discharge: 2023-11-02 | Disposition: A | Discharge status: Home / Self Care | Payer: Worker's Compensation | Source: Ambulatory Visit | Attending: Pulmonary Disease | Admitting: Pulmonary Disease

## 2023-11-02 ENCOUNTER — Other Ambulatory Visit (HOSPITAL_COMMUNITY): Payer: Self-pay

## 2023-11-02 ENCOUNTER — Encounter: Payer: Self-pay | Admitting: Pulmonary Disease

## 2023-11-02 ENCOUNTER — Ambulatory Visit: Payer: Worker's Compensation | Admitting: Pulmonary Disease

## 2023-11-02 VITALS — BP 128/70 | HR 73 | Temp 97.9°F | Ht 68.0 in | Wt 157.4 lb

## 2023-11-02 DIAGNOSIS — J479 Bronchiectasis, uncomplicated: Secondary | ICD-10-CM

## 2023-11-02 DIAGNOSIS — R0602 Shortness of breath: Secondary | ICD-10-CM

## 2023-11-02 DIAGNOSIS — J849 Interstitial pulmonary disease, unspecified: Secondary | ICD-10-CM

## 2023-11-02 DIAGNOSIS — R053 Chronic cough: Secondary | ICD-10-CM

## 2023-11-02 DIAGNOSIS — J4489 Other specified chronic obstructive pulmonary disease: Secondary | ICD-10-CM | POA: Diagnosis not present

## 2023-11-02 DIAGNOSIS — J439 Emphysema, unspecified: Secondary | ICD-10-CM

## 2023-11-02 DIAGNOSIS — R600 Localized edema: Secondary | ICD-10-CM

## 2023-11-02 DIAGNOSIS — K219 Gastro-esophageal reflux disease without esophagitis: Secondary | ICD-10-CM

## 2023-11-02 LAB — CBC WITH DIFFERENTIAL/PLATELET
Abs Immature Granulocytes: 0.07 K/uL (ref 0.00–0.07)
Basophils Absolute: 0.1 K/uL (ref 0.0–0.1)
Basophils Relative: 0 %
Eosinophils Absolute: 0.2 K/uL (ref 0.0–0.5)
Eosinophils Relative: 1 %
HCT: 51.5 % (ref 39.0–52.0)
Hemoglobin: 17.6 g/dL — ABNORMAL HIGH (ref 13.0–17.0)
Immature Granulocytes: 1 %
Lymphocytes Relative: 7 %
Lymphs Abs: 1 K/uL (ref 0.7–4.0)
MCH: 33.5 pg (ref 26.0–34.0)
MCHC: 34.2 g/dL (ref 30.0–36.0)
MCV: 98.1 fL (ref 80.0–100.0)
Monocytes Absolute: 0.6 K/uL (ref 0.1–1.0)
Monocytes Relative: 4 %
Neutro Abs: 12 K/uL — ABNORMAL HIGH (ref 1.7–7.7)
Neutrophils Relative %: 87 %
Platelets: 201 K/uL (ref 150–400)
RBC: 5.25 MIL/uL (ref 4.22–5.81)
RDW: 13.2 % (ref 11.5–15.5)
WBC: 13.8 K/uL — ABNORMAL HIGH (ref 4.0–10.5)
nRBC: 0 % (ref 0.0–0.2)

## 2023-11-02 LAB — PULMONARY FUNCTION TEST
FEF 25-75 Post: 1.15 L/s
FEF 25-75 Pre: 1.02 L/s
FEF2575-%Change-Post: 13 %
FEF2575-%Pred-Post: 51 %
FEF2575-%Pred-Pre: 45 %
FEV1-%Change-Post: 3 %
FEV1-%Pred-Post: 35 %
FEV1-%Pred-Pre: 34 %
FEV1-Post: 1.05 L
FEV1-Pre: 1.02 L
FEV1FVC-%Change-Post: 8 %
FEV1FVC-%Pred-Pre: 109 %
FEV6-%Change-Post: -5 %
FEV6-%Pred-Post: 31 %
FEV6-%Pred-Pre: 33 %
FEV6-Post: 1.2 L
FEV6-Pre: 1.27 L
FEV6FVC-%Pred-Post: 106 %
FEV6FVC-%Pred-Pre: 106 %
FVC-%Change-Post: -5 %
FVC-%Pred-Post: 29 %
FVC-%Pred-Pre: 31 %
FVC-Post: 1.2 L
FVC-Pre: 1.27 L
Post FEV1/FVC ratio: 88 %
Post FEV6/FVC ratio: 100 %
Pre FEV1/FVC ratio: 81 %
Pre FEV6/FVC Ratio: 100 %

## 2023-11-02 LAB — SEDIMENTATION RATE: Sed Rate: 5 mm/h (ref 0–16)

## 2023-11-02 LAB — C-REACTIVE PROTEIN: CRP: 0.6 mg/dL (ref <1.0)

## 2023-11-02 MED ORDER — NEXIUM 40 MG PO CPDR: 40.0000 mg | DELAYED_RELEASE_CAPSULE | Freq: Two times a day (BID) | ORAL | 3 refills | Status: DC

## 2023-11-02 MED ORDER — FUROSEMIDE 20 MG PO TABS
20.0000 mg | ORAL_TABLET | Freq: Every day | ORAL | 3 refills | Status: AC
  Filled 2023-11-02 – 2024-01-04 (×2): qty 90, 90d supply, fill #0
  Filled 2024-02-11 – 2024-04-11 (×2): qty 90, 90d supply, fill #1

## 2023-11-02 MED ORDER — FUROSEMIDE 20 MG PO TABS: 20.0000 mg | ORAL_TABLET | Freq: Every day | ORAL | 3 refills | Status: DC

## 2023-11-02 NOTE — Progress Notes (Addendum)
 Subjective:    Patient ID: Brian Casey, male    DOB: 20-Nov-1950, 73 y.o.   MRN: [id number]  Patient Care Team: Valora Agent, MD as PCP - General (Family Medicine) Perla Evalene PARAS, MD as PCP - Cardiology (Cardiology) Obadiah Coy, MD as Consulting Physician (Cardiothoracic Surgery) Geronimo Amel, MD as Consulting Physician (Pulmonary Disease)  Chief Complaint  Patient presents with   Follow-up    Cough and wheezing. Shortness of breath on exertion.     BACKGROUND/INTERVAL:Brian Casey is a 73 year old former smoker, very complex patient with a history as noted below who has been followed by Dr. Geronimo for interstitial lung disease. He has chronic respiratory failure due to the same.  Interstitial lung disease does not follow UIP pattern.  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 A settlement covers his medications and doctor visits related to asbestosis, but his medications need to be prescribed by his current doctor to be covered by the settlement.  He experiences significant coughing, especially when lying down at night, brushing his teeth, or gargling. He uses two pillows to sleep and is considering an adjustable bed to help with breathing. He coughs when exerting himself and sometimes brings up phlegm, which is occasionally discolored.  He notes that he stays congested and cannot bring secretions up well.  He has had a daily cough for over 6 months.  Has tried flutter valve without relief.  He uses oxygen at night and when exerting himself but not when sitting still.  He is currently on several medications including Nexium , Breztri ,  and Lasix . He takes Nexium  for reflux. He also takes prednisone  daily, which was initially prescribed at a higher dose but is now at 10 mg. He has been on a higher dose in the past, up to 30 mg.  He has undergone a pulmonary function test. He has not yet completed his blood work, which is intended to check for conditions like hypersensitivity pneumonitis and connective tissue disease. He plans to complete this today.  He has a history of smoking, which has contributed to his COPD. He is trying to manage his medications through the [facility] to reduce costs, as some medications are significantly cheaper through this route.  DATA 11/11/2015 PFTs: FEV1 0.72 L or 22% predicted, FVC 1.20 L or 28% predicted, FEV1/FVC 60%, no significant bronchodilator response.  Lung volumes are severely reduced.  The reduction in lung volumes may underestimate the severity of the obstruction.  There is severe diffusion capacity impairment.  This is consistent with an MIXED obstructive and restrictive defect. 02/03/2022 right heart cath: PA 38/12 mean 21 mmHg, PCWP 13 mmHg, Fick cardiac output 6.9 L/min, Fick cardiac index 3.5 L/min.  Normal left and right filling pressures.  Mild pulmonary hypertension (mean PAP 21 mmHg) normal cardiac output and low normal to mildly reduced thermodilution cardiac output. 03/30/2023 CT chest high-resolution: Patchy areas of groundglass attenuation, septal thickening subpleural reticulation and cylindrical bronchiectasis, peripheral bronchiolectasis and honeycombing noted scattered randomly through the lungs.  Right greater than left.  No discernible craniocaudal gradient.  Inspiratory and expiratory imaging demonstrate air trapping indicative of small airways disease.  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  Status post median sternotomy and CABG.  Review of Systems A 10 point review of  systems was performed and it is as noted above otherwise negative.   Patient Active Problem List   Diagnosis Date Noted   Occlusion and stenosis of unspecified carotid artery 04/19/2023   Pedal edema 04/19/2023   High risk medication use 06/05/2022   ILD (interstitial lung disease) (HCC) 04/28/2022   Chronic respiratory failure with hypoxia (HCC) 04/28/2022   Preop pulmonary/respiratory exam 04/28/2022   Shortness of breath 02/03/2022   Diarrhea due to drug 01/22/2022   Tubular adenoma of colon 01/22/2022   Dizziness 08/12/2021   Edema, unspecified 08/12/2021   Erectile dysfunction 08/12/2021   Family history of colon cancer 08/12/2021   GERD (gastroesophageal reflux disease) 08/12/2021   History of hemorrhoids 08/12/2021   Occlusion and stenosis of bilateral carotid arteries 08/12/2021   Pleural effusion 08/27/2017   Acute renal failure (HCC) 08/27/2017   Hyponatremia 08/27/2017   Acute renal failure (ARF) (HCC) 08/27/2017   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   Bilateral carotid artery disease (HCC)    Carotid stenosis 11/09/2015   CKD (chronic kidney disease), 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 11/09/2015   Protein-calorie malnutrition, severe (HCC) 11/09/2015   Sepsis due to Gram negative bacteria (HCC) 11/09/2015   Left ventricular diastolic dysfunction/grade 2 -> Grade 1 DD (10/2021) 11/09/2015   Situational depression 11/06/2015   Generalized anxiety disorder 11/06/2015   Pre-op evaluation    NSTEMI (non-ST elevated myocardial infarction) (HCC)    Coronary artery disease involving native coronary artery of native heart with angina pectoris (HCC)    Acute cholecystitis without calculus 11/04/2015   PAF (paroxysmal atrial fibrillation) (HCC) 11/04/2015   AKI (acute kidney injury) (HCC)    Elevated troponin I level    Coronary artery disease involving native coronary artery of native heart with angina pectoris with documented spasm (HCC)    Aortic atherosclerosis (HCC)    Visit for well man  health check 11/06/2014   Counseling on health promotion and disease prevention 03/29/2014   Umbilical hernia 10/31/2013   Rectus diastasis 10/31/2013   Essential hypertension 10/17/2013   Weight gain 10/11/2013   Swelling of right knee joint 10/11/2013   Puncture wound 08/18/2013   Left low back pain 04/13/2013   HLD (hyperlipidemia) 01/26/2011   Hypothyroidism 03/02/2007   Diabetes mellitus type 2, uncontrolled 03/02/2007   NICOTINE ADDICTION 03/02/2007   DEPRESSION/ ANGER CONTROL 03/02/2007    Social History   Tobacco Use   Smoking status: Former    Current packs/day: 0.00    Average packs/day: 1 pack/day for 48.0 years (48.0 ttl pk-yrs)    Types: Cigarettes    Start date: 11/07/1967    Quit date: 11/07/2015    Years since quitting: 7.9   Smokeless tobacco: Never  Substance Use Topics   Alcohol use: Yes    Alcohol/week: 3.0 standard drinks of alcohol    Types: 3 Cans of beer per week    Comment: occas    Allergies  Allergen Reactions   Penicillins Nausea And Vomiting and Other (See Comments)    REACTION: Vomiting Has patient had a PCN reaction causing immediate rash, facial/tongue/throat swelling, SOB or lightheadedness with hypotension: Yes Has patient had a PCN reaction causing severe rash involving mucus membranes or skin necrosis: No Has patient had a PCN reaction that required hospitalization No Has patient had a PCN reaction occurring within the last 10 years: No  If all of the above answers are NO, then may proceed with Cephalosporin use.   Amlodipine Swelling   Bupropion Hcl     REACTION: Severe constipation, insomnia   Chlorthalidone Other (See Comments)    Other reaction(s): Hyponatremia   Metformin  Diarrhea   Nintedanib Diarrhea and Other (See Comments)   Terazosin Other (See Comments)    Current Meds  Medication Sig   aspirin  EC 81 MG tablet Take 81 mg by mouth daily. Swallow whole.   atorvastatin  (LIPITOR) 40 MG tablet Take 1 tablet (40 mg total)  by mouth daily.   carvedilol  (COREG ) 12.5 MG tablet Take 1 tablet (12.5 mg total) by mouth 2 (two) times daily with a meal.   empagliflozin (JARDIANCE) 10 MG TABS tablet Take 25 mg by mouth daily.   ezetimibe  (ZETIA ) 10 MG tablet Take 1 tablet (10 mg total) by mouth daily.   glucose blood test strip Use to test blood sugar twice a day   levothyroxine  (SYNTHROID ) 100 MCG tablet Take 1 tablet (100 mcg total) by mouth once daily ON AN EMPTY STOMACH WITH A GLASS OF WATER AT LEAST 30-60 MINUTES BEFORE BREAKFAST   OXYGEN Inhale 4 L into the lungs daily.   Pirfenidone  801 MG TABS Take 1 tablet (801 mg total) by mouth in the morning and at bedtime. **low dose as maintenance**   predniSONE  (DELTASONE ) 10 MG tablet Take 1 tablet (10 mg total) by mouth daily with breakfast.   Spacer/Aero-Holding Chambers (AEROCHAMBER MV) inhaler Use as instructed   tadalafil  (CIALIS ) 10 MG tablet Take 1 tablet (10 mg total) by mouth daily as needed for erectile dysfunction.   [DISCONTINUED] esomeprazole  (NEXIUM ) 40 MG capsule Take 1 capsule (40 mg total) by mouth 2 (two) times daily before a meal.   [DISCONTINUED] furosemide  (LASIX ) 20 MG tablet Take 1 tablet (20 mg total) by mouth daily.    Immunization History  Administered Date(s) Administered   Fluad Quad(high Dose 65+) 01/13/2022   Fluad Trivalent(High Dose 65+) 12/22/2017   Influenza Inj Mdck Quad Pf 12/25/2015   Influenza Split 01/26/2011, 02/10/2012   Influenza Whole 01/22/2004, 02/05/2008, 12/26/2008   Influenza, High Dose Seasonal PF 01/12/2023   Influenza,inj,Quad PF,6+ Mos 12/29/2012, 01/10/2014, 12/11/2014   Influenza-Unspecified 12/25/2016, 12/22/2017, 12/21/2018, 12/07/2019, 12/24/2020   PFIZER Comirnaty(Gray Top)Covid-19 Tri-Sucrose Vaccine 04/09/2019, 05/02/2019, 12/28/2019, 01/01/2021   PNEUMOCOCCAL CONJUGATE-20 03/04/2023   Pfizer Covid-19 Vaccine Bivalent Booster 15yrs & up 01/02/2021   Pneumococcal Conjugate-13 03/29/2014, 11/06/2014, 11/15/2017    Pneumococcal Polysaccharide-23 11/05/2016   RSV,unspecified 02/19/2022   Respiratory Syncytial Virus Vaccine,Recomb Aduvanted(Arexvy) 02/19/2022   Td 08/22/1998, 08/18/2013   Unspecified SARS-COV-2 Vaccination 02/20/2022   Zoster, Live 03/29/2014        Objective:     BP 128/70 (BP Location: Left Arm, Patient Position: Sitting, Cuff Size: Normal)   Pulse 73   Temp 97.9 F (36.6 C) (Oral)   Ht 5' 8 (1.727 m)   Wt 157 lb 6.4 oz (71.4 kg)   SpO2 93%   BMI 23.93 kg/m   SpO2: 93 % O2 Device: Nasal cannula  GENERAL: Well-developed, well-nourished gentleman, somewhat plethoric appearing.  Fully ambulatory, mild baseline tachypnea noted.  HEAD: Normocephalic, atraumatic.  EYES: Pupils equal, round, reactive to light.  No scleral icterus.  MOUTH: Porcelain veneers on upper front teeth, oral mucosa moist.  No thrush. NECK: Supple. No thyromegaly. Trachea midline. No JVD.  No adenopathy. PULMONARY: Good air entry bilaterally.  Bilateral Velcro crackles right greater than left. CARDIOVASCULAR: S1 and S2.  Regular rate and rhythm.  No rubs, murmurs or gallops heard. ABDOMEN: Benign. MUSCULOSKELETAL: No joint deformity, no clubbing, no edema.  NEUROLOGIC: No overt focal deficit, no gait disturbance, speech is fluent. SKIN: Intact,warm,dry. PSYCH: Mood and behavior normal.   Assessment & Plan:     ICD-10-CM   1. ILD (interstitial lung disease) (HCC)  J84.9 AMB REFERRAL FOR DME    2. COPD with chronic bronchitis and emphysema (HCC)  J44.89 AMB REFERRAL FOR DME   J43.9     3. Bronchiectasis without complication (HCC)  J47.9     4. GERD without esophagitis  K21.9     5. Chronic cough  R05.3 AMB REFERRAL FOR DME   Daily cough for over 6 months    6. Lower extremity edema  R60.0       Orders Placed This Encounter  Procedures   AMB REFERRAL FOR DME    Referral Priority:   Routine    Referral Type:   Durable Medical Equipment Purchase    Number of Visits Requested:   1     Meds ordered this encounter  Medications   furosemide  (LASIX ) 20 MG tablet    Sig: Take 1 tablet (20 mg total) by mouth daily.    Dispense:  90 tablet    Refill:  3   budesonide-glycopyrrolate -formoterol  (BREZTRI  AEROSPHERE) 160-9-4.8 MCG/ACT AERO inhaler    Sig: Inhale 2 puffs into the lungs in the morning and at bedtime.    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 The lung involvement pattern is atypical for asbestosis, affecting all lung regions. Lung transplant is unsuitable due to age and overall health, and he prefers current management. - Perform blood work to check for hypersensitivity pneumonitis and connective tissue disease - Consider therapy vest for mucus clearance  Chronic obstructive pulmonary disease (COPD) He has COPD, managed with Breztri , which addresses the bronchial component of his lung condition. His smoking history has contributed to COPD development. - Continue Breztri  for COPD management  Bronchiectasis He has bronchiectasis, characterized by bronchial tube widening and secretion pooling, causing difficulty in expectorating sputum.  Has failed flutter valve and other mucus clearance methods. - Prescribe therapy vest to help with mucus clearance  Gastroesophageal reflux disease (GERD) He has GERD, managed with Nexium . He experiences reflux symptoms, such as spitting up after eating, brushing teeth, or gargling. An adjustable bed may help with symptoms when lying down. - Continue Nexium  for GERD management  Cough He experiences a persistent cough,  particularly when lying down or exerting himself, associated with sputum production. The cough is likely due to pulmonary fibrosis and laryngeal nerve sensitivity. Nexium  is used to manage GERD, which can also contribute to the cough. - Consider gabapentin for laryngeal nerve sensitivity  Lower extremity edema He experiences significant leg swelling, previously managed with Lasix  prescribed by another provider. - Prescribe Lasix  and send prescription to [facility]  Advised if symptoms do not improve or worsen, to please contact office for sooner follow up or seek emergency care.    I spent 45 minutes of dedicated to the care of  this patient on the date of this encounter to include pre-visit review of records, face-to-face time with the patient discussing conditions above, post visit ordering of testing, clinical documentation with the electronic health record, making appropriate referrals as documented, and communicating necessary findings to members of the patients care team.     C. Leita Sanders, MD Advanced Bronchoscopy PCCM [facility]    *This note was generated using voice recognition software/Dragon and/or AI transcription program.  Despite best efforts to proofread, errors can occur which can change the meaning. Any transcriptional errors that result from this process are unintentional and may not be fully corrected at the time of dictation.

## 2023-11-02 NOTE — Patient Instructions (Signed)
 VISIT SUMMARY:  Today, you visited to manage your pulmonary conditions, including interstitial lung disease, chronic fibrosing pulmonary disease, and COPD. We discussed your symptoms, current medications, and potential adjustments to your treatment plan.  YOUR PLAN:  -INTERSTITIAL LUNG DISEASE WITH PULMONARY FIBROSIS AND ASBESTOSIS: This condition involves scarring and inflammation of the lung tissue, often due to exposure to harmful substances like asbestos. We will perform blood work to check for other conditions and consider a therapy vest to help clear mucus from your lungs.  -CHRONIC OBSTRUCTIVE PULMONARY DISEASE (COPD): COPD is a chronic lung disease that makes it hard to breathe, often caused by smoking. Continue using Breztri  to manage your COPD.  -BRONCHIECTASIS: Bronchiectasis is a condition where the bronchial tubes in your lungs are permanently widened, leading to mucus buildup. We will prescribe a therapy vest to help clear mucus from your lungs.  -GASTROESOPHAGEAL REFLUX DISEASE (GERD): GERD is a digestive disorder where stomach acid frequently flows back into the tube connecting your mouth and stomach. Continue taking Nexium  to manage your reflux symptoms, and consider using an adjustable bed to help with symptoms when lying down.  -COUGH: Your persistent cough is likely due to your lung conditions and possibly laryngeal nerve sensitivity. We will consider prescribing gabapentin to help with this.  -LOWER EXTREMITY EDEMA: This is swelling in your legs, which can be managed with medication. We will prescribe Lasix  and send the prescription to [facility].  INSTRUCTIONS:  Please complete your blood work at the medical mall to check for hypersensitivity pneumonitis and connective tissue disease. Continue using your current medications as prescribed. We will consider a therapy vest for mucus clearance and possibly gabapentin for your cough. Your Lasix  prescription will be sent  to [facility].

## 2023-11-02 NOTE — Telephone Encounter (Signed)
 See second patient message from today.  Script has been sent in.  Nothing further needed.

## 2023-11-02 NOTE — Patient Instructions (Signed)
 Spirometry Pre/Post only per MD completed today.

## 2023-11-02 NOTE — Progress Notes (Signed)
 Spirometry Pre/Post only per MD completed today.

## 2023-11-03 LAB — SJOGRENS SYNDROME-B EXTRACTABLE NUCLEAR ANTIBODY: SSB (La) (ENA) Antibody, IgG: <0.2 AI (ref 0.0–0.9)

## 2023-11-03 LAB — SJOGRENS SYNDROME-A EXTRACTABLE NUCLEAR ANTIBODY: SSA (Ro) (ENA) Antibody, IgG: <0.2 AI (ref 0.0–0.9)

## 2023-11-03 MED ORDER — BREZTRI AEROSPHERE 160-9-4.8 MCG/ACT IN AERO: 2.0000 | INHALATION_SPRAY | Freq: Two times a day (BID) | RESPIRATORY_TRACT | Status: DC

## 2023-11-04 ENCOUNTER — Telehealth: Payer: Self-pay | Admitting: Pulmonary Disease

## 2023-11-04 LAB — ALLERGEN PANEL (27) + IGE
Alternaria Alternata IgE: <0.1 kU/L
Aspergillus Fumigatus IgE: <0.1 kU/L
Bahia Grass IgE: <0.1 kU/L
Bermuda Grass IgE: <0.1 kU/L
Cat Dander IgE: <0.1 kU/L
Cedar, Mountain IgE: <0.1 kU/L
Cladosporium Herbarum IgE: <0.1 kU/L
Cocklebur IgE: <0.1 kU/L
Cockroach, American IgE: <0.1 kU/L
Common Silver Birch IgE: <0.1 kU/L
D Farinae IgE: <0.1 kU/L
D Pteronyssinus IgE: <0.1 kU/L
Dog Dander IgE: <0.1 kU/L
Elm, American IgE: <0.1 kU/L
Hickory, White IgE: <0.1 kU/L
IgE (Immunoglobulin E), Serum: 63 [IU]/mL (ref 6–495)
Johnson Grass IgE: <0.1 kU/L
Kentucky Bluegrass IgE: <0.1 kU/L
Maple/Box Elder IgE: <0.1 kU/L
Mucor Racemosus IgE: 0.12 kU/L — AB
Oak, White IgE: <0.1 kU/L
Penicillium Chrysogen IgE: <0.1 kU/L
Pigweed, Rough IgE: <0.1 kU/L
Plantain, English IgE: <0.1 kU/L
Ragweed, Short IgE: <0.1 kU/L
Setomelanomma Rostrat: <0.1 kU/L
Timothy Grass IgE: <0.1 kU/L
White Mulberry IgE: <0.1 kU/L

## 2023-11-04 NOTE — Telephone Encounter (Signed)
 Dr. Tamea I have faxed the form and your note to [employer]. We have now received a message from Alan Dage please see below Thank you for the referral for Mr. Ezio Wieck DOB 11-10-1950. I received the fax today. Rx looks great! is the any way the Dr. can add Daily or Chronic to the cough note? Currently the note only says  he has had a cough for over 6 months.  Insurance will kick it back if not Daily or Chronic cough for over 6 months. I will also need the CT scan dated for 03/30/23. I can look in the patient's chart and save copy if needed. Thanks for all your help! let me know if you have any questions! fax number is [phone]  Please add Bronchiectasis primary DX  to prescription current DX's are not valid. Thank you!  Fax number [phone]

## 2023-11-04 NOTE — Telephone Encounter (Signed)
 So by definition cough of over 6 months is a chronic cough.

## 2023-11-05 LAB — HYPERSENSITIVITY PNEUMONITIS
A. Pullulans Abs: NEGATIVE
A.Fumigatus #1 Abs: NEGATIVE
Micropolyspora faeni, IgG: NEGATIVE
Pigeon Serum Abs: NEGATIVE
Thermoact. Saccharii: NEGATIVE
Thermoactinomyces vulgaris, IgG: NEGATIVE

## 2023-11-08 ENCOUNTER — Other Ambulatory Visit: Payer: Self-pay

## 2023-11-08 ENCOUNTER — Encounter: Payer: Self-pay | Admitting: Pulmonary Disease

## 2023-11-08 ENCOUNTER — Other Ambulatory Visit (HOSPITAL_COMMUNITY): Payer: Self-pay

## 2023-11-08 DIAGNOSIS — K219 Gastro-esophageal reflux disease without esophagitis: Secondary | ICD-10-CM

## 2023-11-08 DIAGNOSIS — R053 Chronic cough: Secondary | ICD-10-CM

## 2023-11-08 MED ORDER — ESOMEPRAZOLE MAGNESIUM 40 MG PO CPDR
40.0000 mg | DELAYED_RELEASE_CAPSULE | Freq: Every day | ORAL | 3 refills | Status: DC
  Filled 2023-11-08: qty 90, 90d supply, fill #0

## 2023-11-08 MED ORDER — LANSOPRAZOLE 30 MG PO CPDR: 30.0000 mg | DELAYED_RELEASE_CAPSULE | Freq: Every day | ORAL | 3 refills | Status: DC

## 2023-11-08 MED ORDER — LANSOPRAZOLE 30 MG PO CPDR
30.0000 mg | DELAYED_RELEASE_CAPSULE | Freq: Every day | ORAL | 3 refills | Status: DC
  Filled 2023-11-08: qty 90, 90d supply, fill #0

## 2023-11-08 NOTE — Telephone Encounter (Signed)
 Spoke with pt and clarified concerns about the alternative medication and sent in the original prescribed medication of Nexium  at the patients request to the [facility].

## 2023-11-08 NOTE — Telephone Encounter (Signed)
 Copied from CRM #[id number]. Topic: Clinical - Medication Question >> Nov 08, 2023  3:21 PM Rozanna MATSU wrote: Reason for CRM: PT RETURNING CALL TO CLINIC, WHILE IN THE PROCESS OF WARM TRANSFER CALL TO CAL PT HUNG UP. PT STATED HE HAD QUESTIONS ABOUT THE PRESCRIPTION

## 2023-11-08 NOTE — Addendum Note (Signed)
 Addended by: Falen Lehrmann J on: 11/08/2023 03:27 PM   Modules accepted: Orders

## 2023-11-08 NOTE — Telephone Encounter (Signed)
 Copied from CRM #[id number]. Topic: Clinical - Prescription Issue >> Nov 08, 2023 10:09 AM Benton KIDD wrote: Reason for CRM: denaia fro [place] [facility] calling about a prescription NEXIUM  40 MG capsule that was sent in .  Can you guys send in lansoprazole  as a alternate  The prescription the clinic sent in is not in the formulary [id number] ext [id number]  [facility] [place], [place] - [address] [place] [address] Phone: [phone] Fax: [phone] Hours: Not open 24 hours

## 2023-11-08 NOTE — Telephone Encounter (Signed)
 They are all similar medications.  There are just different formulations.  Prevacid  should do fine.

## 2023-11-09 ENCOUNTER — Other Ambulatory Visit: Payer: Self-pay

## 2023-11-18 ENCOUNTER — Encounter: Payer: Self-pay | Admitting: Pulmonary Disease

## 2023-11-18 DIAGNOSIS — R053 Chronic cough: Secondary | ICD-10-CM

## 2023-11-18 DIAGNOSIS — J479 Bronchiectasis, uncomplicated: Secondary | ICD-10-CM

## 2023-11-18 DIAGNOSIS — J849 Interstitial pulmonary disease, unspecified: Secondary | ICD-10-CM

## 2023-11-18 DIAGNOSIS — J439 Emphysema, unspecified: Secondary | ICD-10-CM

## 2023-11-19 ENCOUNTER — Other Ambulatory Visit: Payer: Self-pay

## 2023-11-19 ENCOUNTER — Other Ambulatory Visit (HOSPITAL_COMMUNITY): Payer: Self-pay

## 2023-11-19 ENCOUNTER — Other Ambulatory Visit: Payer: Self-pay | Admitting: Pulmonary Disease

## 2023-11-19 MED ORDER — BREZTRI AEROSPHERE 160-9-4.8 MCG/ACT IN AERO
2.0000 | INHALATION_SPRAY | Freq: Two times a day (BID) | RESPIRATORY_TRACT | 11 refills | Status: DC
  Filled 2023-11-19: qty 10.7, 30d supply, fill #0
  Filled 2023-12-14: qty 10.7, 30d supply, fill #1
  Filled 2024-01-14: qty 10.7, 30d supply, fill #2

## 2023-11-19 NOTE — Telephone Encounter (Signed)
 Copied from CRM [id number]. Topic: Clinical - Medication Refill >> Nov 19, 2023 10:17 AM Corean SAUNDERS wrote: Medication: budesonide -glycopyrrolate -formoterol  (BREZTRI  AEROSPHERE) 160-9-4.8 MCG/ACT AERO inhaler  Has the patient contacted their pharmacy? No needs refills. (Needs to be billed to [facility])  (Agent: If no, request that the patient contact the pharmacy for the refill. If patient does not wish to contact the pharmacy document the reason why and proceed with request.) (Agent: If yes, when and what did the pharmacy advise?)  This is the patient's preferred pharmacy:    [facility] [address] [place] [address] Phone: [phone] Fax: [phone]   Is this the correct pharmacy for this prescription? Yes If no, delete pharmacy and type the correct one.   Has the prescription been filled recently? No  Is the patient out of the medication? No  Has the patient been seen for an appointment the last year OR does the patient have an upcoming appointment? Yes  Can we respond through MyChart? Yes  Agent: Please be advised that Rx refills may take up to 3 business days. We ask that you follow-up with your pharmacy.

## 2023-11-21 ENCOUNTER — Other Ambulatory Visit (HOSPITAL_COMMUNITY): Payer: Self-pay

## 2023-11-23 ENCOUNTER — Telehealth: Payer: Self-pay | Admitting: Pharmacist

## 2023-11-23 ENCOUNTER — Other Ambulatory Visit: Payer: Self-pay

## 2023-11-23 ENCOUNTER — Other Ambulatory Visit (HOSPITAL_BASED_OUTPATIENT_CLINIC_OR_DEPARTMENT_OTHER): Payer: Self-pay

## 2023-11-23 ENCOUNTER — Other Ambulatory Visit (HOSPITAL_COMMUNITY): Payer: Self-pay

## 2023-11-23 DIAGNOSIS — J849 Interstitial pulmonary disease, unspecified: Secondary | ICD-10-CM

## 2023-11-23 MED ORDER — PIRFENIDONE 801 MG PO TABS
801.0000 mg | ORAL_TABLET | Freq: Two times a day (BID) | ORAL | 1 refills | Status: DC
  Filled 2023-11-24 – 2023-12-14 (×3): qty 180, 90d supply, fill #0

## 2023-11-23 NOTE — Telephone Encounter (Signed)
 Bill to [employer]

## 2023-11-23 NOTE — Telephone Encounter (Signed)
 Needs to be billed to [facility].

## 2023-11-24 ENCOUNTER — Other Ambulatory Visit: Payer: Self-pay | Admitting: Pharmacy Technician

## 2023-11-24 ENCOUNTER — Other Ambulatory Visit: Payer: Self-pay

## 2023-11-24 ENCOUNTER — Other Ambulatory Visit (HOSPITAL_COMMUNITY): Payer: Self-pay

## 2023-11-24 NOTE — Progress Notes (Deleted)
 Patient filling with [facility]. Rx resent

## 2023-11-26 ENCOUNTER — Other Ambulatory Visit (HOSPITAL_COMMUNITY): Payer: Self-pay

## 2023-11-29 ENCOUNTER — Other Ambulatory Visit (HOSPITAL_COMMUNITY): Payer: Self-pay

## 2023-11-29 ENCOUNTER — Other Ambulatory Visit: Payer: Self-pay

## 2023-12-03 ENCOUNTER — Other Ambulatory Visit (HOSPITAL_COMMUNITY): Payer: Self-pay

## 2023-12-03 NOTE — Progress Notes (Signed)
 Pt still currently has roughly 30-day supply on hand, informed him that we would f/u on 12/22/23 to onboard him and ship medication.

## 2023-12-07 NOTE — Telephone Encounter (Signed)
 Smart vest has been ordered.  I just spoke with Izetta with [employer] she stated they still need the referral Dx to also state bronchiectasis and with the Chronic Cough 6 months. Not just chronic cough Can a new order for Smart vest be placed with the added Dx code

## 2023-12-13 ENCOUNTER — Other Ambulatory Visit: Payer: Self-pay

## 2023-12-13 ENCOUNTER — Other Ambulatory Visit (HOSPITAL_COMMUNITY): Payer: Self-pay

## 2023-12-13 MED ORDER — ATORVASTATIN CALCIUM 40 MG PO TABS
40.0000 mg | ORAL_TABLET | Freq: Every day | ORAL | 3 refills | Status: AC
  Filled 2023-12-13: qty 90, 90d supply, fill #0
  Filled 2024-03-15: qty 90, 90d supply, fill #1

## 2023-12-14 ENCOUNTER — Other Ambulatory Visit (HOSPITAL_COMMUNITY): Payer: Self-pay

## 2023-12-14 ENCOUNTER — Encounter: Payer: Self-pay | Admitting: Pulmonary Disease

## 2023-12-14 ENCOUNTER — Other Ambulatory Visit: Payer: Self-pay

## 2023-12-14 ENCOUNTER — Other Ambulatory Visit: Payer: Self-pay | Admitting: Pharmacy Technician

## 2023-12-14 DIAGNOSIS — R053 Chronic cough: Secondary | ICD-10-CM

## 2023-12-14 DIAGNOSIS — J849 Interstitial pulmonary disease, unspecified: Secondary | ICD-10-CM

## 2023-12-14 DIAGNOSIS — J479 Bronchiectasis, uncomplicated: Secondary | ICD-10-CM

## 2023-12-14 NOTE — Progress Notes (Signed)
 Specialty Pharmacy Initial Fill Coordination Note  Brian Casey is a 72 y.o. male contacted today regarding initial fill of specialty medication(s) Pirfenidone    Patient requested Delivery   Delivery date: 12/16/23   Verified address: [address] [place], [place] [address]   Medication will be filled on 9/24.   Patient is aware of $0 copayment.

## 2023-12-14 NOTE — Progress Notes (Signed)
 Called the phone number for [employer] from rejection [phone]. They stated they will forward the request for coverage to the adjuster and will call me directly when they have a response. They were not able to provide me with a turnaround time or mark it urgent

## 2023-12-14 NOTE — Progress Notes (Signed)
 [employer] faxed approval letter to [facility], reprocessed and got a paid claim with a $0 copay.

## 2023-12-14 NOTE — Telephone Encounter (Signed)
 We just had the [facility] Rep. Katie in here yesterday. She didn't mention any problems for this patient. I have sent urgent message to [facility] about the vest order

## 2023-12-20 NOTE — Telephone Encounter (Signed)
 This is already in the body of the note on the history and physical.  I have gone over this note several times.

## 2023-12-20 NOTE — Telephone Encounter (Signed)
 Per Izetta with [employer], diagnosis on the script needs to be Bronchiectasis. And the note needs to included that he has had a daily chronic cough greater then 6 months.  I have updated the script. Dr. Tamea can you update the last office note? Thank you!

## 2023-12-21 NOTE — Telephone Encounter (Signed)
 Another order has been placed for a [employer]. I have sent Alan Dage a urgent message about the new order

## 2023-12-21 NOTE — Telephone Encounter (Signed)
 The new order has been sent to [facility] with [employer]

## 2024-01-04 ENCOUNTER — Other Ambulatory Visit (HOSPITAL_COMMUNITY): Payer: Self-pay

## 2024-01-12 ENCOUNTER — Other Ambulatory Visit: Payer: Self-pay

## 2024-01-14 ENCOUNTER — Other Ambulatory Visit: Payer: Self-pay

## 2024-01-14 NOTE — Progress Notes (Signed)
 Patient has been on pirfenidone , previously obtained at [facility], now obtained through [facility] on worker's comp.   Counseled via telephone on 01/14/24. He verbalizes no concerns with therapy. Continue as prescribed.

## 2024-01-24 ENCOUNTER — Other Ambulatory Visit: Payer: Self-pay

## 2024-01-24 ENCOUNTER — Other Ambulatory Visit (HOSPITAL_COMMUNITY): Payer: Self-pay

## 2024-01-24 DIAGNOSIS — R053 Chronic cough: Secondary | ICD-10-CM

## 2024-01-24 DIAGNOSIS — K219 Gastro-esophageal reflux disease without esophagitis: Secondary | ICD-10-CM

## 2024-01-24 DIAGNOSIS — J849 Interstitial pulmonary disease, unspecified: Secondary | ICD-10-CM

## 2024-01-24 MED ORDER — BREZTRI AEROSPHERE 160-9-4.8 MCG/ACT IN AERO: 2.0000 | INHALATION_SPRAY | Freq: Two times a day (BID) | RESPIRATORY_TRACT | 3 refills | Status: AC

## 2024-01-24 MED ORDER — PREDNISONE 10 MG PO TABS: 10.0000 mg | ORAL_TABLET | Freq: Every day | ORAL | 1 refills | Status: DC

## 2024-01-24 MED ORDER — ESOMEPRAZOLE MAGNESIUM 40 MG PO CPDR: 40.0000 mg | DELAYED_RELEASE_CAPSULE | Freq: Every day | ORAL | 3 refills | Status: AC

## 2024-01-24 MED ORDER — PIRFENIDONE 801 MG PO TABS: 801.0000 mg | ORAL_TABLET | Freq: Two times a day (BID) | ORAL | 1 refills | Status: DC

## 2024-01-24 MED ORDER — PIRFENIDONE 801 MG PO TABS
801.0000 mg | ORAL_TABLET | Freq: Two times a day (BID) | ORAL | 1 refills | Status: DC
  Filled 2024-01-24: qty 60, 30d supply, fill #0

## 2024-01-24 NOTE — Telephone Encounter (Signed)
 Refill sent for ESBRIET  to [facility]: [phone]   Dose: 801mg  twice daily   Last OV: 11/02/23 Provider: Dr. Tamea Pertinent labs: LFTs wnl 08/05/23  Next OV: 02/08/24 - recommend to update LFTs at that time  Aleck Puls, PharmD, BCPS Clinical Pharmacist  [facility]

## 2024-01-24 NOTE — Addendum Note (Signed)
 Addended by: Nashalie Sallis L on: 01/24/2024 04:08 PM   Modules accepted: Orders

## 2024-01-31 ENCOUNTER — Other Ambulatory Visit: Payer: Self-pay

## 2024-01-31 ENCOUNTER — Telehealth: Payer: Self-pay

## 2024-01-31 DIAGNOSIS — J849 Interstitial pulmonary disease, unspecified: Secondary | ICD-10-CM

## 2024-01-31 MED ORDER — PIRFENIDONE 801 MG PO TABS
801.0000 mg | ORAL_TABLET | Freq: Two times a day (BID) | ORAL | 2 refills | Status: AC
  Filled 2024-01-31 – 2024-03-07 (×2): qty 60, 30d supply, fill #0

## 2024-01-31 MED ORDER — PIRFENIDONE 801 MG PO TABS: 801.0000 mg | ORAL_TABLET | Freq: Two times a day (BID) | ORAL | 1 refills | Status: DC

## 2024-01-31 NOTE — Telephone Encounter (Signed)
 error

## 2024-01-31 NOTE — Telephone Encounter (Signed)
 Refill sent for PIRFENIDONE  to [facility]: [phone]   Dose: 801mg  by mouth twice daily   Last OV: 11/02/2023 Provider: Dr. Tamea Pertinent labs: LFTs wnl 08/05/2023  Next OV: 02/08/24  Made note that LFTs are due, can obtain at time of next OV  Aleck Puls, PharmD, BCPS Clinical Pharmacist  [facility]

## 2024-02-02 ENCOUNTER — Ambulatory Visit: Admitting: Pulmonary Disease

## 2024-02-02 ENCOUNTER — Telehealth: Payer: Self-pay | Admitting: *Deleted

## 2024-02-02 NOTE — Telephone Encounter (Signed)
 Spoke with [facility] and relayed verbal order for the Pirfenidone  801 mg as prescribed by Dr. Tamea. NFN.

## 2024-02-02 NOTE — Telephone Encounter (Signed)
 Copied from CRM [id number]. Topic: Clinical - Medication Question >> Jan 31, 2024  2:35 PM Lavanda D wrote: Reason for CRM: Josefa pharmacy tech with [facility] is calling to get a verbal order for Mr. Everett's Pirfenidone  801 MG TABS [[id number]], when he transferred his medications recently this one was missed. CB: [phone] >> Jan 31, 2024  3:48 PM Joesph PARAS wrote: Patient is calling to state that 20 minutes after this initial CRM was sent, they cancelled this prescription because we did not reply. Patient is requesting this be re-issues and sent in. Please update patient when rx has been sent.

## 2024-02-08 ENCOUNTER — Ambulatory Visit (INDEPENDENT_AMBULATORY_CARE_PROVIDER_SITE_OTHER): Payer: Worker's Compensation | Admitting: Pulmonary Disease

## 2024-02-08 ENCOUNTER — Encounter: Payer: Self-pay | Admitting: Pulmonary Disease

## 2024-02-08 VITALS — BP 138/70 | HR 70 | Temp 97.9°F | Ht 68.0 in | Wt 150.0 lb

## 2024-02-08 DIAGNOSIS — R634 Abnormal weight loss: Secondary | ICD-10-CM | POA: Diagnosis not present

## 2024-02-08 DIAGNOSIS — Z77090 Contact with and (suspected) exposure to asbestos: Secondary | ICD-10-CM | POA: Diagnosis not present

## 2024-02-08 DIAGNOSIS — J849 Interstitial pulmonary disease, unspecified: Secondary | ICD-10-CM | POA: Diagnosis not present

## 2024-02-08 DIAGNOSIS — J479 Bronchiectasis, uncomplicated: Secondary | ICD-10-CM

## 2024-02-08 DIAGNOSIS — R053 Chronic cough: Secondary | ICD-10-CM

## 2024-02-08 DIAGNOSIS — M6250 Muscle wasting and atrophy, not elsewhere classified, unspecified site: Secondary | ICD-10-CM | POA: Diagnosis not present

## 2024-02-08 MED ORDER — NEURONTIN 100 MG PO CAPS: 100.0000 mg | ORAL_CAPSULE | Freq: Two times a day (BID) | ORAL | 1 refills | Status: DC

## 2024-02-08 NOTE — Progress Notes (Signed)
 Subjective:    Patient ID: Brian Casey, male    DOB: [birth year], 73 y.o.   MRN: [id number]  Patient Care Team: Valora Lynwood FALCON, MD as PCP - General (Family Medicine) Perla Evalene PARAS, MD as PCP - Cardiology (Cardiology) Obadiah Coy, MD as Consulting Physician (Cardiothoracic Surgery) Geronimo Amel, MD as Consulting Physician (Pulmonary Disease)  Chief Complaint  Patient presents with   Interstitial Lung Disease    Shortness of breath on exertion. Using oxygen PRN.     BACKGROUND/INTERVAL:Brian Casey is a 73 year old former smoker, very complex patient with a history as noted below who has been followed by Dr. Geronimo for interstitial lung disease. He has chronic respiratory failure due to the same.  Interstitial lung disease does not follow UIP pattern.  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  He switched his care to our [place] office on 07 October 2023.  I last saw him on 02 November 2023.  He has been granted [employer]. for asbestosis  HPI Discussed the use of AI scribe software for clinical note transcription with the patient, who gave verbal consent to proceed.  History of Present Illness   Brian Casey is a 73 year old male with interstitial lung disease who presents with persistent coughing and breathing difficulties.  He has been experiencing persistent coughing spells associated with his interstitial lung disease. These episodes are severe, frequent, and impact his daily activities. Activities such as brushing his teeth and using mouthwash irritate his throat and trigger coughing fits. Drinking carbonated beverages like [facility] or Ginger Ale provides some throat soothing, though relief is not immediate.  He uses a vest for his condition, which initially seemed beneficial, but its current effectiveness is uncertain. He uses the vest twice daily, though not always 12 hours apart as recommended. He uses Breztri  with a  spacer, but there is concern about incorrect usage as a whistle sound is heard during inhalation.  He is on 10 mg of prednisone  daily. He has a history of using pirfenidone  twice daily, as increasing to three times daily previously caused stomach issues. He has experienced significant weight loss, from 217 pounds to 144 pounds, attributed to severe diarrhea while on nintedanib. He supplements his diet with Carnation instant breakfast drinks when not feeling like eating and tries to maintain at least one meal a day.  He has completed pulmonary rehabilitation twice, most recently from March to July, to support his wife post-surgery. He has a history of COPD and takes Nexium  50 mg once daily for reflux, which is well-controlled. No current reflux symptoms. He avoids mouthwash due to throat irritation and uses cough drops like Ludens to manage his cough.  He has a history of asbestosis, and his medications are covered by workers' compensation. His last high-resolution chest CT was in January, and he is due for follow-up imaging and pulmonary function tests in January.      DATA 11/11/2015 PFTs: FEV1 0.72 L or 22% predicted, FVC 1.20 L or 28% predicted, FEV1/FVC 60%, no significant bronchodilator response.  Lung volumes are severely reduced.  The reduction in lung volumes may underestimate the severity of the obstruction.  There is severe diffusion capacity impairment.  This is consistent with an MIXED obstructive and restrictive defect. 02/03/2022 right heart cath: PA 38/12 mean 21 mmHg, PCWP 13 mmHg, Fick cardiac output 6.9 L/min, Fick cardiac index 3.5 L/min.  Normal left and right filling pressures.  Mild pulmonary hypertension (mean PAP 21 mmHg) normal cardiac  output and low normal to mildly reduced thermodilution cardiac output. 03/30/2023 CT chest high-resolution: Patchy areas of groundglass attenuation, septal thickening subpleural reticulation and cylindrical bronchiectasis, peripheral  bronchiolectasis and honeycombing noted scattered randomly through the lungs.  Right greater than left.  No discernible craniocaudal gradient.  Inspiratory and expiratory imaging demonstrate air trapping indicative of small airways disease.  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  Status post median sternotomy and CABG.  Review of Systems A 10 point review of systems was performed and it is as noted above otherwise negative.   Patient Active Problem List   Diagnosis Date Noted   Occlusion and stenosis of unspecified carotid artery 04/19/2023   Pedal edema 04/19/2023   High risk medication use 06/05/2022   ILD (interstitial lung disease) (HCC) 04/28/2022   Chronic respiratory failure with hypoxia (HCC) 04/28/2022   Preop pulmonary/respiratory exam 04/28/2022   Shortness of breath 02/03/2022   Diarrhea due to drug 01/22/2022   Tubular adenoma of colon 01/22/2022   Dizziness 08/12/2021   Edema, unspecified 08/12/2021   Erectile dysfunction 08/12/2021   Family history of colon cancer 08/12/2021   GERD (gastroesophageal reflux disease) 08/12/2021   History of hemorrhoids 08/12/2021   Occlusion and stenosis of bilateral carotid arteries 08/12/2021   Pleural effusion 08/27/2017   Acute renal failure 08/27/2017   Hyponatremia 08/27/2017   Acute renal failure (ARF) 08/27/2017   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   Bilateral carotid artery disease    Carotid stenosis 11/09/2015   CKD (chronic kidney disease), 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 11/09/2015   Protein-calorie malnutrition, severe 11/09/2015   Sepsis due to Gram negative bacteria (HCC) 11/09/2015   Left ventricular diastolic dysfunction/grade 2 -> Grade 1 DD (10/2021) 11/09/2015   Situational depression 11/06/2015   Generalized anxiety disorder 11/06/2015   Pre-op evaluation    NSTEMI (non-ST elevated myocardial infarction) (HCC)     Coronary artery disease involving native coronary artery of native heart with angina pectoris    Acute cholecystitis without calculus 11/04/2015   PAF (paroxysmal atrial fibrillation) (HCC) 11/04/2015   AKI (acute kidney injury)    Elevated troponin I level    Coronary artery disease involving native coronary artery of native heart with angina pectoris with documented spasm    Aortic atherosclerosis    Visit for well man health check 11/06/2014   Counseling on health promotion and disease prevention 03/29/2014   Umbilical hernia 10/31/2013   Rectus diastasis 10/31/2013   Essential hypertension 10/17/2013   Weight gain 10/11/2013   Swelling of right knee joint 10/11/2013   Puncture wound 08/18/2013   Left low back pain 04/13/2013   HLD (hyperlipidemia) 01/26/2011   Hypothyroidism 03/02/2007   Diabetes mellitus type 2, uncontrolled 03/02/2007   NICOTINE ADDICTION 03/02/2007   DEPRESSION/ ANGER CONTROL 03/02/2007    Social History   Tobacco Use   Smoking status: Former    Current packs/day: 0.00    Average packs/day: 1 pack/day for 48.0 years (48.0 ttl pk-yrs)    Types: Cigarettes    Start date: 11/07/1967    Quit date: 11/07/2015    Years since quitting: 8.2   Smokeless tobacco: Never  Substance Use Topics   Alcohol use: Yes    Alcohol/week: 3.0 standard drinks of alcohol    Types: 3 Cans of beer per week    Comment: occas    Allergies  Allergen Reactions   Penicillins Nausea And  Vomiting and Other (See Comments)    REACTION: Vomiting Has patient had a PCN reaction causing immediate rash, facial/tongue/throat swelling, SOB or lightheadedness with hypotension: Yes Has patient had a PCN reaction causing severe rash involving mucus membranes or skin necrosis: No Has patient had a PCN reaction that required hospitalization No Has patient had a PCN reaction occurring within the last 10 years: No If all of the above answers are NO, then may proceed with Cephalosporin use.    Amlodipine Swelling   Bupropion Hcl     REACTION: Severe constipation, insomnia   Chlorthalidone Other (See Comments)    Other reaction(s): Hyponatremia   Metformin  Diarrhea   Nintedanib Diarrhea and Other (See Comments)   Terazosin Other (See Comments)    Current Meds  Medication Sig   aspirin  EC 81 MG tablet Take 81 mg by mouth daily. Swallow whole.   atorvastatin  (LIPITOR) 40 MG tablet Take 1 tablet (40 mg total) by mouth daily.   budesonide -glycopyrrolate -formoterol  (BREZTRI  AEROSPHERE) 160-9-4.8 MCG/ACT AERO inhaler Inhale 2 puffs into the lungs in the morning and at bedtime.   carvedilol  (COREG ) 12.5 MG tablet Take 1 tablet (12.5 mg total) by mouth 2 (two) times daily with a meal.   empagliflozin (JARDIANCE) 10 MG TABS tablet Take 25 mg by mouth daily.   esomeprazole  (NEXIUM ) 40 MG capsule Take 1 capsule (40 mg total) by mouth daily at 12 noon.   ezetimibe  (ZETIA ) 10 MG tablet Take 1 tablet (10 mg total) by mouth daily.   furosemide  (LASIX ) 20 MG tablet Take 1 tablet (20 mg total) by mouth daily.   glucose blood test strip Use to test blood sugar twice a day   levothyroxine  (SYNTHROID ) 100 MCG tablet Take 1 tablet (100 mcg total) by mouth once daily ON AN EMPTY STOMACH WITH A GLASS OF WATER AT LEAST 30-60 MINUTES BEFORE BREAKFAST   NEURONTIN 100 MG capsule Take 1 capsule (100 mg total) by mouth 2 (two) times daily.   OXYGEN Inhale 4 L into the lungs daily. (Patient taking differently: Inhale 4 L into the lungs daily. PRN)   Pirfenidone  801 MG TABS Take 1 tablet (801 mg total) by mouth in the morning and at bedtime. (low dose as maintenance)   predniSONE  (DELTASONE ) 10 MG tablet Take 1 tablet (10 mg total) by mouth daily with breakfast.   Spacer/Aero-Holding Chambers (AEROCHAMBER MV) inhaler Use as instructed   tadalafil  (CIALIS ) 10 MG tablet Take 1 tablet (10 mg total) by mouth daily as needed for erectile dysfunction.    Immunization History  Administered Date(s) Administered    Fluad Quad(high Dose 65+) 01/13/2022   Fluad Trivalent(High Dose 65+) 12/22/2017   INFLUENZA, HIGH DOSE SEASONAL PF 01/12/2023, 01/24/2024   Influenza Inj Mdck Quad Pf 12/25/2015   Influenza Split 01/26/2011, 02/10/2012   Influenza Whole 01/22/2004, 02/05/2008, 12/26/2008   Influenza,inj,Quad PF,6+ Mos 12/29/2012, 01/10/2014, 12/11/2014   Influenza-Unspecified 12/25/2016, 12/22/2017, 12/21/2018, 12/07/2019, 12/24/2020   PFIZER Comirnaty(Gray Top)Covid-19 Tri-Sucrose Vaccine 04/09/2019, 05/02/2019, 12/28/2019, 01/01/2021   PNEUMOCOCCAL CONJUGATE-20 03/04/2023   Pfizer Covid-19 Vaccine Bivalent Booster 18yrs & up 01/02/2021   Pneumococcal Conjugate-13 03/29/2014, 11/06/2014, 11/15/2017   Pneumococcal Polysaccharide-23 11/05/2016   RSV,unspecified 02/19/2022   Respiratory Syncytial Virus Vaccine,Recomb Aduvanted(Arexvy) 02/19/2022   Td 08/22/1998, 08/18/2013   Unspecified SARS-COV-2 Vaccination 02/20/2022   Zoster, Live 03/29/2014        Objective:     BP 138/70   Pulse 70   Temp 97.9 F (36.6 C) (Temporal)   Ht 5' 8 (  1.727 m)   Wt 150 lb (68 kg)   SpO2 90% Comment: on room air  BMI 22.81 kg/m   SpO2: 90 % (on room air)  GENERAL: Well-developed, well-nourished gentleman, somewhat plethoric appearing.  Fully ambulatory, mild baseline tachypnea noted.  HEAD: Normocephalic, atraumatic.  EYES: Pupils equal, round, reactive to light.  No scleral icterus.  MOUTH: Porcelain veneers on upper front teeth, oral mucosa moist.  No thrush. NECK: Supple. No thyromegaly. Trachea midline. No JVD.  No adenopathy. PULMONARY: Good air entry bilaterally.  Bilateral Velcro crackles right greater than left. CARDIOVASCULAR: S1 and S2. Regular rate and rhythm.  No rubs, murmurs or gallops heard. ABDOMEN: Benign. MUSCULOSKELETAL: No joint deformity, no clubbing, no edema.  NEUROLOGIC: No overt focal deficit, no gait disturbance, speech is fluent. SKIN: Intact,warm,dry. PSYCH: Mood and behavior  normal.        Assessment & Plan:     ICD-10-CM   1. ILD (interstitial lung disease) (HCC)  J84.9 CT Chest High Resolution    Pulmonary function test    6 minute walk    2. Bronchiectasis without complication (HCC)  J47.9     3. Chronic cough  R05.3       Orders Placed This Encounter  Procedures   CT Chest High Resolution    Standing Status:   Future    Expected Date:   04/03/2024    Expiration Date:   02/07/2025    Preferred imaging location?:   [facility]   Pulmonary function test    Standing Status:   Future    Expiration Date:   02/07/2025    Where should this test be performed?:   Outpatient Pulmonary    What type of PFT is being ordered?:   Full PFT   6 minute walk    Standing Status:   Future    Expected Date:   04/03/2024    Expiration Date:   02/07/2025    Where should this test be performed?:   [facility]    Meds ordered this encounter  Medications   NEURONTIN 100 MG capsule    Sig: Take 1 capsule (100 mg total) by mouth 2 (two) times daily.    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 Current treatment includes Breztri  and prednisone . Cough exacerbated by throat irritation from mouthwash and toothpaste. Oxygen therapy ineffective during coughing episodes. Pulmonary rehabilitation completed. 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 Increasing prednisone  not recommended due to potential side effects and lack of efficacy for fibrosis-related cough. - Prescribed gabapentin 100 mg twice daily, starting at a low dose to minimize sedation. - Advised use of cough drops without mint or menthol, such as Ludens. - Recommended sipping water during coughing episodes. - Continue Breztri  with proper inhalation technique using a spacer. - Continue prednisone  10 mg daily. - Encouraged use of the vest twice  daily, preferably after waking up. - Scheduled high-resolution chest CT, 6-minute walk and pulmonary function tests (PFTs) in January.  Unintentional weight loss and muscle wasting Significant weight loss and muscle wasting, likely exacerbated by previous medication side effects and inadequate nutritional intake. Current nutritional intake includes Coronation and occasional meals. Muscle wasting impacts respiratory function due to reliance on respiratory muscles. - Recommended increasing protein intake with options like Muscle Milk or additional protein-rich foods such as chicken, eggs, and beef. -  Continue Carnation instant breakfast as a nutritional supplement for now. - Monitor weight and nutritional status.   Follow-up after studies in January.   Advised if symptoms do not improve or worsen, to please contact office for sooner follow up or seek emergency care.    I spent 40 minutes of dedicated to the care of this patient on the date of this encounter to include pre-visit review of records, face-to-face time with the patient discussing conditions above, post visit ordering of testing, clinical documentation with the electronic health record, making appropriate referrals as documented, and communicating necessary findings to members of the patients care team.     C. Leita Sanders, MD Advanced Bronchoscopy PCCM  [facility]    *This note was generated using voice recognition software/Dragon and/or AI transcription program.  Despite best efforts to proofread, errors can occur which can change the meaning. Any transcriptional errors that result from this process are unintentional and may not be fully corrected at the time of dictation.

## 2024-02-08 NOTE — Patient Instructions (Signed)
 VISIT SUMMARY:  Brian Casey, a 73 year old male with interstitial lung disease, presented with persistent coughing and breathing difficulties. He has experienced severe coughing spells that impact his daily activities and has had significant weight loss. He uses a vest and Breztri  for his condition and is on prednisone . He has completed pulmonary rehabilitation and has a history of COPD and asbestosis. His medications are covered by workers' compensation.  YOUR PLAN:  -INTERSTITIAL PULMONARY FIBROSIS WITH CHRONIC COUGH AND BRONCHIECTASIS: Interstitial pulmonary fibrosis is a condition where the lung tissue becomes scarred and stiff, leading to breathing difficulties. Chronic cough and bronchiectasis are complications of this condition. You are to start taking gabapentin 100 mg twice daily to help with the chronic cough, avoid mint or menthol cough drops, and sip water during coughing episodes. Continue using Breztri  with the proper inhalation technique and prednisone  10 mg daily. Use the vest twice daily, preferably after waking up. A high-resolution chest CT and pulmonary function tests are scheduled for January.  -UNINTENTIONAL WEIGHT LOSS AND MUSCLE WASTING: Unintentional weight loss and muscle wasting can occur due to inadequate nutritional intake and medication side effects, impacting your respiratory function. You are advised to increase your protein intake with options like Muscle Milk or protein-rich foods such as chicken, eggs, and beef. Continue using Coronation as a nutritional supplement and monitor your weight and nutritional status.  INSTRUCTIONS:  Follow up with high-resolution chest CT and pulmonary function tests in January.

## 2024-02-11 ENCOUNTER — Other Ambulatory Visit (HOSPITAL_COMMUNITY): Payer: Self-pay

## 2024-02-22 ENCOUNTER — Telehealth: Payer: Self-pay

## 2024-02-22 DIAGNOSIS — R053 Chronic cough: Secondary | ICD-10-CM

## 2024-02-22 DIAGNOSIS — J849 Interstitial pulmonary disease, unspecified: Secondary | ICD-10-CM

## 2024-02-22 MED ORDER — NEURONTIN 100 MG PO CAPS: 100.0000 mg | ORAL_CAPSULE | Freq: Two times a day (BID) | ORAL | 1 refills | Status: AC

## 2024-02-22 NOTE — Telephone Encounter (Signed)
 Patient requesting Neurontin  100 mg capsule.   Copied from CRM #[id number]. Topic: Clinical - Prescription Issue >> Feb 22, 2024  1:44 PM Isabell A wrote: Reason for CRM: Patient states for his medication, it was never sent to his adjuster to get it approved.   Email to get approved before going to [facility]:  [email or web address] or # [phone]  Requesting a call back once sent.  Callback number: [phone]

## 2024-02-22 NOTE — Telephone Encounter (Signed)
 Lm for Jamee to call back with fax number to send the script to.

## 2024-02-23 NOTE — Telephone Encounter (Signed)
 Prescription emailed to [facility]. Notified of receipt. NFN.

## 2024-03-07 ENCOUNTER — Other Ambulatory Visit: Payer: Self-pay

## 2024-03-07 ENCOUNTER — Other Ambulatory Visit (HOSPITAL_COMMUNITY): Payer: Self-pay

## 2024-03-07 ENCOUNTER — Telehealth: Payer: Self-pay

## 2024-03-07 NOTE — Telephone Encounter (Signed)
 Received notification via specialty pharmacy encounter that pirfenidone  was filled somewhere else and is refill too soon until 04/24/24. Per 02/02/24 encounter rx was incorrectly called in to [facility] when he has been filling at [facility].   Called the pt and he advised me he did not specifically make the request and has no preference where he fills it. He stated he though [employer] may have preferred he fill with [facility] because it might have cost them less.  Called [facility] at [phone] and they stated they have no preference for which pharmacy fills it. Called [facility] at [phone] and requested they d/c their rx.   Called pt back and advised him he will continue to fill with [facility], he confirmed he received a 90 day supply from [facility] on 11/25. I advised him pharmacy should reach out to him when it's time for him to refill it again but I will send him a message with their phone number in case he doesn't hear from them. NFN

## 2024-03-07 NOTE — Progress Notes (Unsigned)
 Rx was accidentally called in to [facility], pt received 90 day supply from them on 11/25. Their rx has been d/c and pt agrees to continue to fill through [facility]. He is aware he will not receive another refill until around February. He knows to look out for MyChart questionnaire and sent message to him with phone number for our specialty pharmacy. Please follow up with patient in February to refill.

## 2024-03-13 ENCOUNTER — Other Ambulatory Visit: Payer: Self-pay

## 2024-03-13 ENCOUNTER — Other Ambulatory Visit (HOSPITAL_COMMUNITY): Payer: Self-pay

## 2024-03-13 MED ORDER — POTASSIUM CHLORIDE ER 10 MEQ PO TBCR
10.0000 meq | EXTENDED_RELEASE_TABLET | Freq: Every day | ORAL | 3 refills | Status: AC
  Filled 2024-03-13: qty 90, 90d supply, fill #0

## 2024-03-14 ENCOUNTER — Other Ambulatory Visit: Payer: Self-pay

## 2024-03-15 ENCOUNTER — Other Ambulatory Visit (HOSPITAL_COMMUNITY): Payer: Self-pay

## 2024-03-17 ENCOUNTER — Other Ambulatory Visit (HOSPITAL_COMMUNITY): Payer: Self-pay

## 2024-03-17 MED ORDER — ONETOUCH VERIO VI STRP
1.0000 | ORAL_STRIP | Freq: Two times a day (BID) | 3 refills | Status: AC
  Filled 2024-03-17 (×2): qty 200, 100d supply, fill #0

## 2024-04-03 ENCOUNTER — Other Ambulatory Visit (HOSPITAL_BASED_OUTPATIENT_CLINIC_OR_DEPARTMENT_OTHER): Payer: Self-pay

## 2024-04-03 ENCOUNTER — Other Ambulatory Visit (HOSPITAL_COMMUNITY): Payer: Self-pay

## 2024-04-03 ENCOUNTER — Other Ambulatory Visit

## 2024-04-03 ENCOUNTER — Ambulatory Visit: Payer: Worker's Compensation

## 2024-04-03 ENCOUNTER — Other Ambulatory Visit: Payer: Self-pay

## 2024-04-03 ENCOUNTER — Other Ambulatory Visit: Payer: Self-pay | Admitting: Pulmonary Disease

## 2024-04-03 DIAGNOSIS — J849 Interstitial pulmonary disease, unspecified: Secondary | ICD-10-CM

## 2024-04-03 DIAGNOSIS — R053 Chronic cough: Secondary | ICD-10-CM

## 2024-04-03 DIAGNOSIS — J471 Bronchiectasis with (acute) exacerbation: Secondary | ICD-10-CM

## 2024-04-03 MED ORDER — DOXYCYCLINE HYCLATE 100 MG PO TABS
100.0000 mg | ORAL_TABLET | Freq: Two times a day (BID) | ORAL | 0 refills | Status: AC
  Filled 2024-04-03: qty 14, 7d supply, fill #0

## 2024-04-03 MED ORDER — PREDNISONE 10 MG PO TABS
20.0000 mg | ORAL_TABLET | Freq: Every day | ORAL | 1 refills | Status: AC
  Filled 2024-04-03: qty 120, 60d supply, fill #0

## 2024-04-03 NOTE — Progress Notes (Signed)
 Patient presented for 6-minute walk.  Reported increased cough with yellow sputum production.  Prescribed prednisone  and doxycycline .  Patient has follow-up appointment.   Brian Leita Sanders, MD Advanced Bronchoscopy PCCM [place] [facility]

## 2024-04-03 NOTE — Progress Notes (Signed)
 Patient was seen in the office for a qualifying walk test.  The patient started out on 4L of O2. The patient was able to walk 2 labs before his O2 dropped to 86%. I placed him on 6L of O2 and his oxygen came back up to 96%.   Dr. Tamea was in the office and she did notify the patient that he will now need to be on 5L O2 with exertion. She will re-evaluate him at his appt on 1/26.

## 2024-04-05 ENCOUNTER — Ambulatory Visit
Admission: RE | Admit: 2024-04-05 | Discharge: 2024-04-05 | Disposition: A | Discharge status: Home / Self Care | Payer: Worker's Compensation | Source: Ambulatory Visit | Attending: Pulmonary Disease | Admitting: Pulmonary Disease

## 2024-04-05 DIAGNOSIS — J849 Interstitial pulmonary disease, unspecified: Secondary | ICD-10-CM

## 2024-04-12 ENCOUNTER — Other Ambulatory Visit: Payer: Self-pay

## 2024-04-14 ENCOUNTER — Telehealth: Payer: Self-pay | Admitting: Pulmonary Disease

## 2024-04-14 ENCOUNTER — Other Ambulatory Visit: Payer: Self-pay

## 2024-04-14 ENCOUNTER — Other Ambulatory Visit (HOSPITAL_COMMUNITY): Payer: Self-pay

## 2024-04-14 MED ORDER — HYDROCOD POLI-CHLORPHE POLI ER 10-8 MG/5ML PO SUER
5.0000 mL | Freq: Two times a day (BID) | ORAL | 0 refills | Status: AC | PRN
  Filled 2024-04-14 – 2024-04-20 (×2): qty 120, 12d supply, fill #0

## 2024-04-14 NOTE — Telephone Encounter (Signed)
 Pt's wife states finished abx still got the cough and is coughing up mucous. Throat is irritated

## 2024-04-14 NOTE — Telephone Encounter (Signed)
 The cough is likely related to his interstitial lung disease (lung fibrosis) this is unfortunately a very difficult issue to control.  Will send in a prescription to his pharmacy to help over the weekend until he can be seen in follow-up.

## 2024-04-17 ENCOUNTER — Ambulatory Visit: Payer: Worker's Compensation | Admitting: Pulmonary Disease

## 2024-04-18 ENCOUNTER — Other Ambulatory Visit (HOSPITAL_COMMUNITY): Payer: Self-pay

## 2024-04-18 ENCOUNTER — Other Ambulatory Visit: Payer: Self-pay

## 2024-04-18 ENCOUNTER — Encounter: Payer: Self-pay | Admitting: Pharmacist

## 2024-04-19 ENCOUNTER — Other Ambulatory Visit: Payer: Self-pay

## 2024-04-20 ENCOUNTER — Other Ambulatory Visit (HOSPITAL_COMMUNITY): Payer: Self-pay

## 2024-04-20 ENCOUNTER — Other Ambulatory Visit: Payer: Self-pay

## 2024-04-21 ENCOUNTER — Other Ambulatory Visit (HOSPITAL_COMMUNITY): Payer: Self-pay

## 2024-05-11 ENCOUNTER — Ambulatory Visit: Payer: Worker's Compensation | Admitting: Pulmonary Disease

## 2024-07-11 ENCOUNTER — Ambulatory Visit: Payer: Self-pay | Admitting: Cardiovascular Disease
